# Patient Record
Sex: Female | Born: 1955 | Race: White | Hispanic: No | Smoking: Former smoker
Health system: Southern US, Community
[De-identification: ages and names within clinical notes are randomized; demographics above are authoritative.]

## PROBLEM LIST (undated history)

## (undated) DIAGNOSIS — F1910 Other psychoactive substance abuse, uncomplicated: Secondary | ICD-10-CM

## (undated) DIAGNOSIS — C801 Malignant (primary) neoplasm, unspecified: Secondary | ICD-10-CM

## (undated) DIAGNOSIS — M726 Necrotizing fasciitis: Secondary | ICD-10-CM

## (undated) DIAGNOSIS — G4700 Insomnia, unspecified: Secondary | ICD-10-CM

## (undated) DIAGNOSIS — J449 Chronic obstructive pulmonary disease, unspecified: Secondary | ICD-10-CM

## (undated) DIAGNOSIS — R0602 Shortness of breath: Secondary | ICD-10-CM

## (undated) DIAGNOSIS — Z720 Tobacco use: Secondary | ICD-10-CM

## (undated) DIAGNOSIS — D126 Benign neoplasm of colon, unspecified: Secondary | ICD-10-CM

## (undated) DIAGNOSIS — K529 Noninfective gastroenteritis and colitis, unspecified: Secondary | ICD-10-CM

## (undated) DIAGNOSIS — K648 Other hemorrhoids: Secondary | ICD-10-CM

## (undated) DIAGNOSIS — R45851 Suicidal ideations: Secondary | ICD-10-CM

## (undated) DIAGNOSIS — I96 Gangrene, not elsewhere classified: Secondary | ICD-10-CM

## (undated) DIAGNOSIS — T8859XA Other complications of anesthesia, initial encounter: Secondary | ICD-10-CM

## (undated) DIAGNOSIS — I10 Essential (primary) hypertension: Secondary | ICD-10-CM

## (undated) DIAGNOSIS — E119 Type 2 diabetes mellitus without complications: Secondary | ICD-10-CM

## (undated) DIAGNOSIS — K922 Gastrointestinal hemorrhage, unspecified: Secondary | ICD-10-CM

## (undated) DIAGNOSIS — F319 Bipolar disorder, unspecified: Secondary | ICD-10-CM

## (undated) DIAGNOSIS — F329 Major depressive disorder, single episode, unspecified: Secondary | ICD-10-CM

## (undated) DIAGNOSIS — F32A Depression, unspecified: Secondary | ICD-10-CM

## (undated) DIAGNOSIS — L03115 Cellulitis of right lower limb: Secondary | ICD-10-CM

## (undated) DIAGNOSIS — K644 Residual hemorrhoidal skin tags: Secondary | ICD-10-CM

## (undated) DIAGNOSIS — E785 Hyperlipidemia, unspecified: Secondary | ICD-10-CM

## (undated) DIAGNOSIS — T4145XA Adverse effect of unspecified anesthetic, initial encounter: Secondary | ICD-10-CM

## (undated) DIAGNOSIS — R51 Headache: Secondary | ICD-10-CM

## (undated) DIAGNOSIS — F419 Anxiety disorder, unspecified: Secondary | ICD-10-CM

## (undated) DIAGNOSIS — E039 Hypothyroidism, unspecified: Secondary | ICD-10-CM

## (undated) HISTORY — DX: Residual hemorrhoidal skin tags: K64.4

## (undated) HISTORY — PX: KNEE SURGERY: SHX244

## (undated) HISTORY — DX: Benign neoplasm of colon, unspecified: D12.6

## (undated) HISTORY — DX: Other hemorrhoids: K64.8

## (undated) HISTORY — PX: TONSILLECTOMY: SUR1361

## (undated) HISTORY — PX: FINGER SURGERY: SHX640

## (undated) HISTORY — PX: COLON SURGERY: SHX602

## (undated) HISTORY — PX: APPENDECTOMY: SHX54

## (undated) HISTORY — PX: TUBAL LIGATION: SHX77

## (undated) HISTORY — PX: ABDOMINAL HYSTERECTOMY: SHX81

## (undated) HISTORY — PX: HERNIA REPAIR: SHX51

---

## 2012-01-23 ENCOUNTER — Encounter (HOSPITAL_COMMUNITY): Payer: Self-pay | Admitting: Family Medicine

## 2012-01-23 ENCOUNTER — Emergency Department (HOSPITAL_COMMUNITY): Payer: PRIVATE HEALTH INSURANCE

## 2012-01-23 ENCOUNTER — Emergency Department (HOSPITAL_COMMUNITY)
Admission: EM | Admit: 2012-01-23 | Discharge: 2012-01-23 | Disposition: A | Discharge status: Home / Self Care | Payer: PRIVATE HEALTH INSURANCE | Attending: Emergency Medicine | Admitting: Emergency Medicine

## 2012-01-23 DIAGNOSIS — F411 Generalized anxiety disorder: Secondary | ICD-10-CM | POA: Insufficient documentation

## 2012-01-23 DIAGNOSIS — Z794 Long term (current) use of insulin: Secondary | ICD-10-CM | POA: Insufficient documentation

## 2012-01-23 DIAGNOSIS — F17200 Nicotine dependence, unspecified, uncomplicated: Secondary | ICD-10-CM | POA: Insufficient documentation

## 2012-01-23 DIAGNOSIS — E119 Type 2 diabetes mellitus without complications: Secondary | ICD-10-CM | POA: Insufficient documentation

## 2012-01-23 DIAGNOSIS — I10 Essential (primary) hypertension: Secondary | ICD-10-CM | POA: Insufficient documentation

## 2012-01-23 DIAGNOSIS — F319 Bipolar disorder, unspecified: Secondary | ICD-10-CM | POA: Insufficient documentation

## 2012-01-23 DIAGNOSIS — R0602 Shortness of breath: Secondary | ICD-10-CM | POA: Insufficient documentation

## 2012-01-23 DIAGNOSIS — F419 Anxiety disorder, unspecified: Secondary | ICD-10-CM

## 2012-01-23 DIAGNOSIS — Z79899 Other long term (current) drug therapy: Secondary | ICD-10-CM | POA: Insufficient documentation

## 2012-01-23 HISTORY — DX: Suicidal ideations: R45.851

## 2012-01-23 HISTORY — DX: Bipolar disorder, unspecified: F31.9

## 2012-01-23 HISTORY — DX: Depression, unspecified: F32.A

## 2012-01-23 HISTORY — DX: Insomnia, unspecified: G47.00

## 2012-01-23 HISTORY — DX: Essential (primary) hypertension: I10

## 2012-01-23 HISTORY — DX: Major depressive disorder, single episode, unspecified: F32.9

## 2012-01-23 LAB — URINE MICROSCOPIC-ADD ON

## 2012-01-23 LAB — CBC WITH DIFFERENTIAL/PLATELET
Basophils Absolute: 0.1 10*3/uL (ref 0.0–0.1)
Eosinophils Relative: 3 % (ref 0–5)
Lymphocytes Relative: 45 % (ref 12–46)
Lymphs Abs: 2.7 10*3/uL (ref 0.7–4.0)
MCV: 89.5 fL (ref 78.0–100.0)
Neutrophils Relative %: 45 % (ref 43–77)
Platelets: 166 10*3/uL (ref 150–400)
RBC: 4.75 MIL/uL (ref 3.87–5.11)
RDW: 13.6 % (ref 11.5–15.5)
WBC: 5.9 10*3/uL (ref 4.0–10.5)

## 2012-01-23 LAB — COMPREHENSIVE METABOLIC PANEL
ALT: 16 U/L (ref 0–35)
AST: 16 U/L (ref 0–37)
Alkaline Phosphatase: 89 U/L (ref 39–117)
CO2: 32 mEq/L (ref 19–32)
Calcium: 10.1 mg/dL (ref 8.4–10.5)
GFR calc non Af Amer: >90 mL/min (ref >90)
Glucose, Bld: 128 mg/dL — ABNORMAL HIGH (ref 70–99)
Potassium: 4 mEq/L (ref 3.5–5.1)
Sodium: 142 mEq/L (ref 135–145)
Total Protein: 7.8 g/dL (ref 6.0–8.3)

## 2012-01-23 LAB — GLUCOSE, CAPILLARY
Glucose-Capillary: 134 mg/dL — ABNORMAL HIGH (ref 70–99)
Glucose-Capillary: 105 mg/dL — ABNORMAL HIGH (ref 70–99)

## 2012-01-23 LAB — POCT I-STAT TROPONIN I

## 2012-01-23 LAB — URINALYSIS, ROUTINE W REFLEX MICROSCOPIC
Bilirubin Urine: NEGATIVE
Hgb urine dipstick: NEGATIVE
Ketones, ur: 15 mg/dL — AB
Specific Gravity, Urine: 1.026 (ref 1.005–1.030)
pH: 7.5 (ref 5.0–8.0)

## 2012-01-23 MED ORDER — FREESTYLE SYSTEM KIT: 1.0000 | PACK | Status: DC | PRN

## 2012-01-23 NOTE — ED Notes (Signed)
Pt requesting crackers for blood sugar.  CBG OF 105.

## 2012-01-23 NOTE — ED Notes (Signed)
The patient is AOx4 and comfortable with her discharge instructions. 

## 2012-01-23 NOTE — ED Notes (Signed)
Pt sts she is just not feeling well. sts dry mouth. Denies SOB but when talking patient speaking in short sentence and sats 90% on RA. Denies pain. Pt diabetic. CBG normal.

## 2012-01-23 NOTE — ED Provider Notes (Signed)
History     CSN: [id number]  Arrival date & time 01/23/12  1478   First MD Initiated Contact with Patient 01/23/12 2157      Chief Complaint  Patient presents with  . Fatigue  . Shortness of Breath    (Consider location/radiation/quality/duration/timing/severity/associated sxs/prior treatment) The history is provided by the patient and a relative.   the patient reports that she attempted to take her blood sugar today but her glucometer was not working.  She took insulin around lunchtime and blood sugar on arrival to the emergency department was in the low 100s.  They report they spoke with the pharmacist regarding getting new test strips and the pharmacist stated be $50 for this they had a prescription and then charged covered.  The patient recently moved to [place] from [place] several days ago and does not have a primary care physician. She reports she has all the rest of her medications she is concerned about her glucose and her ability to monitor it.  As reported from nursing notes that she stated "she just wasn't feeling well and that she had a dry mouth the nursing noted her to be in short sentences with O2 sats of 90% on room air.  The patient reports to me that she has no chest pain or shortness of breath.  She reports she is slightly anxious but has been for several days since her recent move from [place].  They reported they did not have the issues with her glucometer they would not come to the emergency department today for evaluation.  Patient is otherwise without symptoms.  Past Medical History  Diagnosis Date  . Hypertension   . Diabetes mellitus   . Bipolar 1 disorder   . Insomnia   . Depression   . Suicidal thoughts     History reviewed. No pertinent past surgical history.  History reviewed. No pertinent family history.  History  Substance Use Topics  . Smoking status: Current Everyday Smoker  . Smokeless tobacco: Not on file  . Alcohol Use: No     OB History    Grav Para Term Preterm Abortions TAB SAB Ect Mult Living                  Review of Systems  All other systems reviewed and are negative.    Allergies  Morphine and related  Home Medications   Current Outpatient Rx  Name Route Sig Dispense Refill  . AMLODIPINE BESYLATE 5 MG PO TABS Oral Take 5 mg by mouth daily.    . BUDESONIDE-FORMOTEROL FUMARATE 160-4.5 MCG/ACT IN AERO Inhalation Inhale 2 puffs into the lungs 2 (two) times daily.    Marland Kitchen DIVALPROEX SODIUM 500 MG PO TBEC Oral Take 500 mg by mouth 2 (two) times daily.    Marland Kitchen DOXEPIN HCL 75 MG PO CAPS Oral Take 75 mg by mouth at bedtime.    Marland Kitchen FLUTICASONE PROPIONATE  HFA 110 MCG/ACT IN AERO Inhalation Inhale 1 puff into the lungs 2 (two) times daily.    . INSULIN ASPART 100 UNIT/ML [place] SOLN Subcutaneous Inject 0-10 Units into the skin 3 (three) times daily before meals. Per sliding scale    . LEVETIRACETAM 250 MG PO TABS Oral Take 250 mg by mouth every 12 (twelve) hours.    Marland Kitchen METFORMIN HCL 500 MG PO TABS Oral Take 500 mg by mouth 2 (two) times daily with a meal.    . PRIMIDONE 50 MG PO TABS Oral Take 50 mg by  mouth at bedtime.    Marland Kitchen SIMVASTATIN 20 MG PO TABS Oral Take 20 mg by mouth every evening.      BP 141/63  Pulse 83  Temp 98.5 F (36.9 C) (Oral)  Resp 16  Ht 5\' 5"  (1.651 m)  Wt 265 lb (120.203 kg)  BMI 44.10 kg/m2  SpO2 95%  Physical Exam  Nursing note and vitals reviewed. Constitutional: She is oriented to person, place, and time. She appears well-developed and well-nourished. No distress.  HENT:  Head: Normocephalic and atraumatic.  Eyes: EOM are normal.  Neck: Normal range of motion.  Cardiovascular: Normal rate, regular rhythm and normal heart sounds.   Pulmonary/Chest: Effort normal and breath sounds normal.  Abdominal: Soft. She exhibits no distension. There is no tenderness.  Musculoskeletal: Normal range of motion.  Neurological: She is alert and oriented to person, place, and time.  Skin:  Skin is warm and dry.  Psychiatric: She has a normal mood and affect.       Anxious appearing    ED Course  Procedures (including critical care time)  Labs Reviewed  GLUCOSE, CAPILLARY - Abnormal; Notable for the following:    Glucose-Capillary 134 (*)     All other components within normal limits  COMPREHENSIVE METABOLIC PANEL - Abnormal; Notable for the following:    Glucose, Bld 128 (*)     Creatinine, Ser 0.49 (*)     Total Bilirubin 0.2 (*)     All other components within normal limits  URINALYSIS, ROUTINE W REFLEX MICROSCOPIC - Abnormal; Notable for the following:    APPearance CLOUDY (*)     Ketones, ur 15 (*)     Leukocytes, UA MODERATE (*)     All other components within normal limits  URINE MICROSCOPIC-ADD ON - Abnormal; Notable for the following:    Squamous Epithelial / LPF MANY (*)     All other components within normal limits  GLUCOSE, CAPILLARY - Abnormal; Notable for the following:    Glucose-Capillary 105 (*)     All other components within normal limits  CBC WITH DIFFERENTIAL  POCT I-STAT TROPONIN I   Dg Chest 2 View  01/23/2012  *RADIOLOGY REPORT*  Clinical Data: Shortness of breath with fatigue.  History of hypertension, COPD and diabetes.  CHEST - 2 VIEW  Comparison: None.  Findings: There is mild cardiac enlargement.  The mediastinal contours are normal.  The lungs are clear.  There is no pleural effusion or pneumothorax.  There is a probable old right-sided rib fracture posteriorly.  IMPRESSION: No acute cardiopulmonary process.  Mild cardiomegaly.   Original Report Authenticated By: Gerrianne Scale, M.D.     I personally reviewed the imaging tests through PACS system  I reviewed available ER/hospitalization records thought the EMR   1. Anxiety       MDM  The patient stated she was really in emergency department simply to get a glucometer strips for her glucose monitor.  On the [employer] this issue with the strips are initially with a glucometer  therefore bring her prescription for both.  Discharge home.  She's new to the area will need a primary care physician.        Lyanne Co, MD 01/23/12 [phone]

## 2012-04-13 ENCOUNTER — Emergency Department (HOSPITAL_COMMUNITY): Payer: Medicaid Other

## 2012-04-13 ENCOUNTER — Inpatient Hospital Stay (HOSPITAL_COMMUNITY)
Admission: EM | Admit: 2012-04-13 | Discharge: 2012-04-16 | DRG: 190 | Disposition: A | Discharge status: Home / Self Care | Payer: Medicaid Other | Attending: Internal Medicine | Admitting: Internal Medicine

## 2012-04-13 ENCOUNTER — Encounter (HOSPITAL_COMMUNITY): Payer: Self-pay | Admitting: Emergency Medicine

## 2012-04-13 ENCOUNTER — Observation Stay (HOSPITAL_COMMUNITY): Payer: Medicaid Other

## 2012-04-13 DIAGNOSIS — R45851 Suicidal ideations: Secondary | ICD-10-CM

## 2012-04-13 DIAGNOSIS — M545 Low back pain, unspecified: Secondary | ICD-10-CM | POA: Diagnosis present

## 2012-04-13 DIAGNOSIS — Z794 Long term (current) use of insulin: Secondary | ICD-10-CM

## 2012-04-13 DIAGNOSIS — IMO0002 Reserved for concepts with insufficient information to code with codable children: Secondary | ICD-10-CM | POA: Diagnosis present

## 2012-04-13 DIAGNOSIS — E119 Type 2 diabetes mellitus without complications: Secondary | ICD-10-CM

## 2012-04-13 DIAGNOSIS — E039 Hypothyroidism, unspecified: Secondary | ICD-10-CM | POA: Diagnosis present

## 2012-04-13 DIAGNOSIS — F32A Depression, unspecified: Secondary | ICD-10-CM | POA: Diagnosis present

## 2012-04-13 DIAGNOSIS — J9600 Acute respiratory failure, unspecified whether with hypoxia or hypercapnia: Secondary | ICD-10-CM | POA: Diagnosis present

## 2012-04-13 DIAGNOSIS — Z66 Do not resuscitate: Secondary | ICD-10-CM | POA: Diagnosis present

## 2012-04-13 DIAGNOSIS — G8929 Other chronic pain: Secondary | ICD-10-CM | POA: Diagnosis present

## 2012-04-13 DIAGNOSIS — J441 Chronic obstructive pulmonary disease with (acute) exacerbation: Principal | ICD-10-CM

## 2012-04-13 DIAGNOSIS — R0902 Hypoxemia: Secondary | ICD-10-CM

## 2012-04-13 DIAGNOSIS — I10 Essential (primary) hypertension: Secondary | ICD-10-CM | POA: Diagnosis present

## 2012-04-13 DIAGNOSIS — Z720 Tobacco use: Secondary | ICD-10-CM | POA: Diagnosis present

## 2012-04-13 DIAGNOSIS — G4709 Other insomnia: Secondary | ICD-10-CM | POA: Diagnosis present

## 2012-04-13 DIAGNOSIS — F319 Bipolar disorder, unspecified: Secondary | ICD-10-CM | POA: Diagnosis present

## 2012-04-13 DIAGNOSIS — E1065 Type 1 diabetes mellitus with hyperglycemia: Secondary | ICD-10-CM | POA: Diagnosis present

## 2012-04-13 DIAGNOSIS — D696 Thrombocytopenia, unspecified: Secondary | ICD-10-CM | POA: Diagnosis present

## 2012-04-13 DIAGNOSIS — G4700 Insomnia, unspecified: Secondary | ICD-10-CM

## 2012-04-13 DIAGNOSIS — J9620 Acute and chronic respiratory failure, unspecified whether with hypoxia or hypercapnia: Secondary | ICD-10-CM | POA: Diagnosis present

## 2012-04-13 DIAGNOSIS — M549 Dorsalgia, unspecified: Secondary | ICD-10-CM

## 2012-04-13 DIAGNOSIS — F17200 Nicotine dependence, unspecified, uncomplicated: Secondary | ICD-10-CM | POA: Diagnosis present

## 2012-04-13 DIAGNOSIS — F329 Major depressive disorder, single episode, unspecified: Secondary | ICD-10-CM

## 2012-04-13 DIAGNOSIS — E118 Type 2 diabetes mellitus with unspecified complications: Secondary | ICD-10-CM

## 2012-04-13 DIAGNOSIS — J449 Chronic obstructive pulmonary disease, unspecified: Secondary | ICD-10-CM

## 2012-04-13 DIAGNOSIS — Z79899 Other long term (current) drug therapy: Secondary | ICD-10-CM

## 2012-04-13 HISTORY — DX: Acute respiratory failure, unspecified whether with hypoxia or hypercapnia: J96.00

## 2012-04-13 HISTORY — DX: Chronic obstructive pulmonary disease, unspecified: J44.9

## 2012-04-13 HISTORY — DX: Other complications of anesthesia, initial encounter: T88.59XA

## 2012-04-13 HISTORY — DX: Type 2 diabetes mellitus without complications: E11.9

## 2012-04-13 HISTORY — DX: Adverse effect of unspecified anesthetic, initial encounter: T41.45XA

## 2012-04-13 HISTORY — DX: Chronic obstructive pulmonary disease with (acute) exacerbation: J44.1

## 2012-04-13 LAB — BASIC METABOLIC PANEL
Calcium: 9.3 mg/dL (ref 8.4–10.5)
GFR calc Af Amer: >90 mL/min (ref >90)
GFR calc non Af Amer: >90 mL/min (ref >90)
Glucose, Bld: 124 mg/dL — ABNORMAL HIGH (ref 70–99)
Potassium: 4.3 mEq/L (ref 3.5–5.1)
Sodium: 140 mEq/L (ref 135–145)

## 2012-04-13 LAB — GLUCOSE, CAPILLARY
Glucose-Capillary: 175 mg/dL — ABNORMAL HIGH (ref 70–99)
Glucose-Capillary: 218 mg/dL — ABNORMAL HIGH (ref 70–99)

## 2012-04-13 LAB — CBC WITH DIFFERENTIAL/PLATELET
Basophils Absolute: 0 10*3/uL (ref 0.0–0.1)
Basophils Relative: 1 % (ref 0–1)
Eosinophils Absolute: 0.2 10*3/uL (ref 0.0–0.7)
Eosinophils Relative: 3 % (ref 0–5)
Lymphs Abs: 2.4 10*3/uL (ref 0.7–4.0)
MCH: 29.3 pg (ref 26.0–34.0)
Neutrophils Relative %: 54 % (ref 43–77)
Platelets: 146 10*3/uL — ABNORMAL LOW (ref 150–400)
RBC: 4.57 MIL/uL (ref 3.87–5.11)
RDW: 13.9 % (ref 11.5–15.5)
WBC: 6.6 10*3/uL (ref 4.0–10.5)

## 2012-04-13 LAB — PRO B NATRIURETIC PEPTIDE: Pro B Natriuretic peptide (BNP): 218.8 pg/mL — ABNORMAL HIGH (ref 0–125)

## 2012-04-13 LAB — TROPONIN I: Troponin I: <0.3 ng/mL (ref <0.30)

## 2012-04-13 MED ORDER — LEVOFLOXACIN 500 MG PO TABS
750.0000 mg | ORAL_TABLET | Freq: Every day | ORAL | Status: DC
  Administered 2012-04-13 – 2012-04-16 (×4): 750 mg via ORAL
  Filled 2012-04-13 (×4): qty 2

## 2012-04-13 MED ORDER — SERTRALINE HCL 50 MG PO TABS
200.0000 mg | ORAL_TABLET | Freq: Every day | ORAL | Status: DC
  Administered 2012-04-14 – 2012-04-16 (×3): 200 mg via ORAL
  Filled 2012-04-13 (×3): qty 4

## 2012-04-13 MED ORDER — ALPRAZOLAM 0.25 MG PO TABS
0.2500 mg | ORAL_TABLET | Freq: Once | ORAL | Status: AC
  Administered 2012-04-13: 0.25 mg via ORAL
  Filled 2012-04-13: qty 1

## 2012-04-13 MED ORDER — TIOTROPIUM BROMIDE MONOHYDRATE 18 MCG IN CAPS
18.0000 ug | ORAL_CAPSULE | Freq: Every day | RESPIRATORY_TRACT | Status: DC
  Administered 2012-04-13 – 2012-04-16 (×4): 18 ug via RESPIRATORY_TRACT
  Filled 2012-04-13: qty 5

## 2012-04-13 MED ORDER — AMLODIPINE BESYLATE 5 MG PO TABS
5.0000 mg | ORAL_TABLET | Freq: Every day | ORAL | Status: DC
  Administered 2012-04-14 – 2012-04-16 (×3): 5 mg via ORAL
  Filled 2012-04-13 (×3): qty 1

## 2012-04-13 MED ORDER — FENTANYL CITRATE 0.05 MG/ML IJ SOLN
50.0000 ug | Freq: Once | INTRAMUSCULAR | Status: AC
  Administered 2012-04-13: 50 ug via INTRAVENOUS
  Filled 2012-04-13: qty 2

## 2012-04-13 MED ORDER — SIMVASTATIN 20 MG PO TABS
20.0000 mg | ORAL_TABLET | Freq: Every day | ORAL | Status: DC
  Administered 2012-04-13 – 2012-04-15 (×3): 20 mg via ORAL
  Filled 2012-04-13 (×4): qty 1

## 2012-04-13 MED ORDER — FLUTICASONE PROPIONATE HFA 110 MCG/ACT IN AERO
1.0000 | INHALATION_SPRAY | Freq: Two times a day (BID) | RESPIRATORY_TRACT | Status: DC
  Administered 2012-04-13 – 2012-04-16 (×6): 1 via RESPIRATORY_TRACT
  Filled 2012-04-13: qty 12

## 2012-04-13 MED ORDER — POLYETHYLENE GLYCOL 3350 17 G PO PACK
17.0000 g | PACK | Freq: Every day | ORAL | Status: DC | PRN
  Filled 2012-04-13: qty 1

## 2012-04-13 MED ORDER — CLONIDINE HCL 0.1 MG PO TABS
0.2000 mg | ORAL_TABLET | Freq: Every day | ORAL | Status: DC
  Administered 2012-04-13 – 2012-04-15 (×3): 0.2 mg via ORAL
  Filled 2012-04-13 (×4): qty 2

## 2012-04-13 MED ORDER — GABAPENTIN 300 MG PO CAPS
300.0000 mg | ORAL_CAPSULE | Freq: Three times a day (TID) | ORAL | Status: DC
  Administered 2012-04-13 – 2012-04-16 (×8): 300 mg via ORAL
  Filled 2012-04-13 (×11): qty 1

## 2012-04-13 MED ORDER — ENOXAPARIN SODIUM 60 MG/0.6ML ~~LOC~~ SOLN
60.0000 mg | SUBCUTANEOUS | Status: DC
  Administered 2012-04-13 – 2012-04-15 (×3): 60 mg via SUBCUTANEOUS
  Filled 2012-04-13 (×4): qty 0.6

## 2012-04-13 MED ORDER — ALBUTEROL SULFATE (5 MG/ML) 0.5% IN NEBU
5.0000 mg | INHALATION_SOLUTION | Freq: Once | RESPIRATORY_TRACT | Status: DC
  Filled 2012-04-13: qty 1

## 2012-04-13 MED ORDER — BUSPIRONE HCL 10 MG PO TABS
15.0000 mg | ORAL_TABLET | Freq: Two times a day (BID) | ORAL | Status: DC
  Administered 2012-04-13 – 2012-04-16 (×6): 15 mg via ORAL
  Filled 2012-04-13 (×7): qty 1.5

## 2012-04-13 MED ORDER — INSULIN ASPART 100 UNIT/ML ~~LOC~~ SOLN
0.0000 [IU] | Freq: Every day | SUBCUTANEOUS | Status: DC
  Administered 2012-04-13 – 2012-04-15 (×3): 2 [IU] via SUBCUTANEOUS

## 2012-04-13 MED ORDER — PROPRANOLOL HCL 40 MG PO TABS
40.0000 mg | ORAL_TABLET | Freq: Two times a day (BID) | ORAL | Status: DC
  Administered 2012-04-13 – 2012-04-16 (×6): 40 mg via ORAL
  Filled 2012-04-13 (×7): qty 1

## 2012-04-13 MED ORDER — INSULIN ASPART 100 UNIT/ML ~~LOC~~ SOLN
0.0000 [IU] | Freq: Three times a day (TID) | SUBCUTANEOUS | Status: DC
  Administered 2012-04-14: 3 [IU] via SUBCUTANEOUS
  Administered 2012-04-14 – 2012-04-15 (×3): 2 [IU] via SUBCUTANEOUS
  Administered 2012-04-15: 5 [IU] via SUBCUTANEOUS
  Administered 2012-04-15: 3 [IU] via SUBCUTANEOUS
  Administered 2012-04-16 (×2): 2 [IU] via SUBCUTANEOUS

## 2012-04-13 MED ORDER — ALBUTEROL SULFATE (5 MG/ML) 0.5% IN NEBU: 2.5000 mg | INHALATION_SOLUTION | RESPIRATORY_TRACT | Status: DC | PRN

## 2012-04-13 MED ORDER — IPRATROPIUM BROMIDE 0.02 % IN SOLN: 0.5000 mg | Freq: Four times a day (QID) | RESPIRATORY_TRACT | Status: DC

## 2012-04-13 MED ORDER — ACETAMINOPHEN 325 MG PO TABS: 650.0000 mg | ORAL_TABLET | Freq: Four times a day (QID) | ORAL | Status: DC | PRN

## 2012-04-13 MED ORDER — METHYLPREDNISOLONE SODIUM SUCC 125 MG IJ SOLR
60.0000 mg | Freq: Three times a day (TID) | INTRAMUSCULAR | Status: DC
  Administered 2012-04-13 – 2012-04-16 (×9): 60 mg via INTRAVENOUS
  Filled 2012-04-13 (×11): qty 0.96

## 2012-04-13 MED ORDER — ALBUTEROL SULFATE (5 MG/ML) 0.5% IN NEBU
2.5000 mg | INHALATION_SOLUTION | Freq: Four times a day (QID) | RESPIRATORY_TRACT | Status: DC
  Administered 2012-04-13: 2.5 mg via RESPIRATORY_TRACT
  Filled 2012-04-13: qty 0.5

## 2012-04-13 MED ORDER — DOXEPIN HCL 75 MG PO CAPS
150.0000 mg | ORAL_CAPSULE | Freq: Every day | ORAL | Status: DC
  Administered 2012-04-13 – 2012-04-15 (×3): 150 mg via ORAL
  Filled 2012-04-13 (×4): qty 2

## 2012-04-13 MED ORDER — PRIMIDONE 50 MG PO TABS
100.0000 mg | ORAL_TABLET | Freq: Every day | ORAL | Status: DC
  Administered 2012-04-13 – 2012-04-15 (×3): 100 mg via ORAL
  Filled 2012-04-13 (×4): qty 2

## 2012-04-13 MED ORDER — LISINOPRIL 20 MG PO TABS
20.0000 mg | ORAL_TABLET | Freq: Every day | ORAL | Status: DC
  Administered 2012-04-14 – 2012-04-16 (×3): 20 mg via ORAL
  Filled 2012-04-13 (×3): qty 1

## 2012-04-13 MED ORDER — HYDROCODONE-ACETAMINOPHEN 5-325 MG PO TABS
1.0000 | ORAL_TABLET | ORAL | Status: DC | PRN
  Administered 2012-04-13 – 2012-04-16 (×7): 2 via ORAL
  Filled 2012-04-13 (×7): qty 2

## 2012-04-13 MED ORDER — ACETAMINOPHEN 650 MG RE SUPP: 650.0000 mg | Freq: Four times a day (QID) | RECTAL | Status: DC | PRN

## 2012-04-13 MED ORDER — ALBUTEROL SULFATE (5 MG/ML) 0.5% IN NEBU
5.0000 mg | INHALATION_SOLUTION | Freq: Once | RESPIRATORY_TRACT | Status: AC
  Administered 2012-04-13: 5 mg via RESPIRATORY_TRACT

## 2012-04-13 MED ORDER — ENOXAPARIN SODIUM 40 MG/0.4ML ~~LOC~~ SOLN: 40.0000 mg | SUBCUTANEOUS | Status: DC

## 2012-04-13 MED ORDER — BUDESONIDE-FORMOTEROL FUMARATE 160-4.5 MCG/ACT IN AERO
2.0000 | INHALATION_SPRAY | Freq: Two times a day (BID) | RESPIRATORY_TRACT | Status: DC
  Administered 2012-04-13 – 2012-04-16 (×6): 2 via RESPIRATORY_TRACT
  Filled 2012-04-13: qty 6

## 2012-04-13 MED ORDER — NICOTINE 21 MG/24HR TD PT24
21.0000 mg | MEDICATED_PATCH | Freq: Every day | TRANSDERMAL | Status: DC
  Administered 2012-04-13 – 2012-04-16 (×4): 21 mg via TRANSDERMAL
  Filled 2012-04-13 (×4): qty 1

## 2012-04-13 MED ORDER — DIVALPROEX SODIUM ER 500 MG PO TB24
1000.0000 mg | ORAL_TABLET | Freq: Two times a day (BID) | ORAL | Status: DC
  Administered 2012-04-13 – 2012-04-16 (×6): 1000 mg via ORAL
  Filled 2012-04-13 (×7): qty 2

## 2012-04-13 MED ORDER — MIRTAZAPINE 7.5 MG PO TABS
7.5000 mg | ORAL_TABLET | Freq: Every day | ORAL | Status: DC
  Administered 2012-04-13 – 2012-04-15 (×3): 7.5 mg via ORAL
  Filled 2012-04-13 (×4): qty 1

## 2012-04-13 MED ORDER — METHYLPREDNISOLONE SODIUM SUCC 125 MG IJ SOLR
125.0000 mg | Freq: Once | INTRAMUSCULAR | Status: AC
  Administered 2012-04-13: 125 mg via INTRAVENOUS
  Filled 2012-04-13: qty 2

## 2012-04-13 MED ORDER — ONDANSETRON HCL 4 MG PO TABS: 4.0000 mg | ORAL_TABLET | Freq: Four times a day (QID) | ORAL | Status: DC | PRN

## 2012-04-13 MED ORDER — LEVOTHYROXINE SODIUM 88 MCG PO TABS
88.0000 ug | ORAL_TABLET | Freq: Every day | ORAL | Status: DC
  Administered 2012-04-14 – 2012-04-16 (×3): 88 ug via ORAL
  Filled 2012-04-13 (×4): qty 1

## 2012-04-13 MED ORDER — NICOTINE 21 MG/24HR TD PT24
21.0000 mg | MEDICATED_PATCH | Freq: Once | TRANSDERMAL | Status: DC
  Administered 2012-04-13: 21 mg via TRANSDERMAL
  Filled 2012-04-13: qty 1

## 2012-04-13 MED ORDER — IPRATROPIUM BROMIDE 0.02 % IN SOLN
0.5000 mg | Freq: Once | RESPIRATORY_TRACT | Status: AC
  Administered 2012-04-13: 0.5 mg via RESPIRATORY_TRACT
  Filled 2012-04-13: qty 2.5

## 2012-04-13 MED ORDER — ONDANSETRON HCL 4 MG/2ML IJ SOLN: 4.0000 mg | Freq: Four times a day (QID) | INTRAMUSCULAR | Status: DC | PRN

## 2012-04-13 MED ORDER — ZOLPIDEM TARTRATE 5 MG PO TABS
15.0000 mg | ORAL_TABLET | Freq: Every day | ORAL | Status: DC
  Administered 2012-04-13 – 2012-04-15 (×3): 15 mg via ORAL
  Filled 2012-04-13 (×3): qty 3

## 2012-04-13 NOTE — Progress Notes (Signed)
Pt confirms pcp Dr Jeannetta Nap in [place] [facility] updated

## 2012-04-13 NOTE — ED Provider Notes (Signed)
History     CSN: [id number]  Arrival date & time 04/13/12  1054   First MD Initiated Contact with Patient 04/13/12 1127      Chief Complaint  Patient presents with  . Back Pain    (Consider location/radiation/quality/duration/timing/severity/associated sxs/prior treatment) HPI Pt with history of chronic low back pain and COPD present to the ED for evaluation of severe aching lower back pain worse with movement and sitting, similar to previous. She was found to be hypoxic in triage. She also reports increased cough and wheezing recently, not improved with home medications. Does not wear O2 at home. Denies any CP. No fever.   Past Medical History  Diagnosis Date  . Hypertension   . Diabetes mellitus   . Bipolar 1 disorder   . Insomnia   . Depression   . Suicidal thoughts     History reviewed. No pertinent past surgical history.  History reviewed. No pertinent family history.  History  Substance Use Topics  . Smoking status: Current Every Day Smoker  . Smokeless tobacco: Not on file  . Alcohol Use: No    OB History    Grav Para Term Preterm Abortions TAB SAB Ect Mult Living                  Review of Systems All other systems reviewed and are negative except as noted in HPI.   Allergies  Morphine and related  Home Medications   Current Outpatient Rx  Name  Route  Sig  Dispense  Refill  . AMLODIPINE BESYLATE 10 MG PO TABS   Oral   Take 5 mg by mouth daily.         . BUDESONIDE-FORMOTEROL FUMARATE 160-4.5 MCG/ACT IN AERO   Inhalation   Inhale 2 puffs into the lungs 2 (two) times daily.         . BUSPIRONE HCL 15 MG PO TABS   Oral   Take 15 mg by mouth 2 (two) times daily.         Marland Kitchen CLONIDINE HCL 0.1 MG PO TABS   Oral   Take 0.2 mg by mouth at bedtime.         Marland Kitchen DIVALPROEX SODIUM ER 500 MG PO TB24   Oral   Take 1,000 mg by mouth 2 (two) times daily.         Marland Kitchen DOXEPIN HCL 50 MG PO CAPS   Oral   Take 150 mg by mouth at bedtime.         Marland Kitchen FLUTICASONE PROPIONATE  HFA 110 MCG/ACT IN AERO   Inhalation   Inhale 1 puff into the lungs 2 (two) times daily.         Marland Kitchen GABAPENTIN 300 MG PO CAPS   Oral   Take 300 mg by mouth 3 (three) times daily.         . INSULIN ASPART 100 UNIT/ML [place] SOLN   Subcutaneous   Inject 10-15 Units into the skin daily as needed. Per sliding scale         . LEVOTHYROXINE SODIUM 88 MCG PO TABS   Oral   Take 88 mcg by mouth daily.         Marland Kitchen LISINOPRIL 40 MG PO TABS   Oral   Take 20 mg by mouth daily.         Marland Kitchen METFORMIN HCL 850 MG PO TABS   Oral   Take 850 mg by mouth 2 (two) times daily with a  meal.         . MIRTAZAPINE 7.5 MG PO TABS   Oral   Take 7.5 mg by mouth at bedtime.         Marland Kitchen PRIMIDONE 50 MG PO TABS   Oral   Take 100 mg by mouth at bedtime.          Marland Kitchen PROPRANOLOL HCL 40 MG PO TABS   Oral   Take 40 mg by mouth 2 (two) times daily.         . SERTRALINE HCL 100 MG PO TABS   Oral   Take 200 mg by mouth daily.         Marland Kitchen SIMVASTATIN 40 MG PO TABS   Oral   Take 20 mg by mouth at bedtime.         Marland Kitchen ZOLPIDEM TARTRATE 10 MG PO TABS   Oral   Take 15 mg by mouth at bedtime.         Marland Kitchen FREESTYLE SYSTEM KIT   Does not apply   1 each by Does not apply route as needed for other.   1 each   1     BP 110/68  Pulse 73  Temp 98.6 F (37 C) (Oral)  Resp 22  SpO2 97%  Physical Exam  Nursing note and vitals reviewed. Constitutional: She is oriented to person, place, and time. She appears well-developed and well-nourished.  HENT:  Head: Normocephalic and atraumatic.  Eyes: EOM are normal. Pupils are equal, round, and reactive to light.  Neck: Normal range of motion. Neck supple.  Cardiovascular: Normal rate, normal heart sounds and intact distal pulses.   Pulmonary/Chest: She has wheezes.       Decreased air movement  Abdominal: Bowel sounds are normal. She exhibits no distension. There is no tenderness.  Musculoskeletal: Normal range of motion.  She exhibits tenderness (soft tissue tenderness in lower back). She exhibits no edema.  Neurological: She is alert and oriented to person, place, and time. She has normal strength. No cranial nerve deficit or sensory deficit.  Skin: Skin is warm and dry. No rash noted.  Psychiatric: She has a normal mood and affect.    ED Course  Procedures (including critical care time)  Labs Reviewed  CBC WITH DIFFERENTIAL - Abnormal; Notable for the following:    Platelets 146 (*)     All other components within normal limits  BASIC METABOLIC PANEL - Abnormal; Notable for the following:    CO2 35 (*)     Glucose, Bld 124 (*)     Creatinine, Ser 0.38 (*)     All other components within normal limits  PRO B NATRIURETIC PEPTIDE - Abnormal; Notable for the following:    Pro B Natriuretic peptide (BNP) 218.8 (*)     All other components within normal limits  TROPONIN I   Dg Chest 2 View  04/13/2012  *RADIOLOGY REPORT*  Clinical Data: Cough, shortness of breath, chest pain  CHEST - 2 VIEW  Comparison: 01/23/2012  Findings: Increased interstitial markings, likely chronic.  No focal consolidation. No pleural effusion or pneumothorax.  Moderate cardiomegaly.  Mild degenerative changes of the visualized thoracolumbar spine.  IMPRESSION: No evidence of acute cardiopulmonary disease.  Moderate cardiomegaly.   Original Report Authenticated By: Charline Bills, M.D.      No diagnosis found.    MDM   Date: 04/13/2012  Rate: 71  Rhythm: normal sinus rhythm  QRS Axis: normal  Intervals: normal  ST/T Wave  abnormalities: normal  Conduction Disutrbances: none  Narrative Interpretation: unremarkable  2:49 PM Pt has had neb x 2 with solumedrol but still wheezing and hypoxic, requiring 4L [place] to stay above 90%. Will admit for COPD exacerbation. Pt requesting nicotine patch and 'something for anxiety'.           Peterson Mathey B. Bernette Mayers, MD 04/13/12 1450

## 2012-04-13 NOTE — H&P (Signed)
Patient's PCP: Kaleen Mask, MD  Chief Complaint: Low back pain and shortness of breath  History of Present Illness: [name] [name] is a 56 y.o. Caucasian female with history of hypertension, type 2 diabetes, bipolar disorder, insomnia, depression, and COPD he presents with the above complaints.  Patient has moved to [facility] on August of 2013, prior to that was living in [place].  She reports that she has a history of COPD, has stopped smoking for about 7 months but after she moved to [place] she started smoking again.  She has history of chronic low back pain with history of lumbar disk problems which started 7 years ago.  Patient has seen her primary care physician and has been referred to [facility] orthopedics for her back pain and has an appointment in December.  She had worsening back pain which limited her movement at home as a result she presented to the ED for evaluation. In the ED patient was found to be hypoxic with wheezing.  The hospitalist service was asked to admit the patient for COPD exacerbation.  Patient has not had any fevers, chills, nausea, vomiting, chest, abdominal pain, diarrhea, headaches or vision changes.  Past Medical History  Diagnosis Date  . Hypertension   . Diabetes mellitus   . Bipolar 1 disorder   . Insomnia   . Depression   . Suicidal thoughts   . COPD (chronic obstructive pulmonary disease)    History reviewed. No pertinent past surgical history. Family History  Problem Relation Age of Onset  . Multiple sclerosis Father    History   Social History  . Marital Status: Divorced    Spouse Name: N/A    Number of Children: N/A  . Years of Education: N/A   Occupational History  . Not on file.   Social History Main Topics  . Smoking status: Current Every Day Smoker -- 1.0 packs/day for 42 years  . Smokeless tobacco: Never Used  . Alcohol Use: No     Comment: no etoh now, used in the past  . Drug Use: Yes    Special:  Marijuana     Comment: high school - speed, hx prescription drugs 04/13/12 - no drug use now  . Sexually Active:    Other Topics Concern  . 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   Allergies: Morphine and related  Home Meds: Prior to Admission medications   Medication Sig Start Date End Date Taking? Authorizing Provider  amLODipine (NORVASC) 10 MG tablet Take 5 mg by mouth daily.   Yes Historical Provider, MD  budesonide-formoterol (SYMBICORT) 160-4.5 MCG/ACT inhaler Inhale 2 puffs into the lungs 2 (two) times daily.   Yes Historical Provider, MD  busPIRone (BUSPAR) 15 MG tablet Take 15 mg by mouth 2 (two) times daily.   Yes Historical Provider, MD  cloNIDine (CATAPRES) 0.1 MG tablet Take 0.2 mg by mouth at bedtime.   Yes Historical Provider, MD  divalproex (DEPAKOTE ER) 500 MG 24 hr tablet Take 1,000 mg by mouth 2 (two) times daily.   Yes Historical Provider, MD  doxepin (SINEQUAN) 50 MG capsule Take 150 mg by mouth at bedtime.   Yes Historical Provider, MD  fluticasone (FLOVENT HFA) 110 MCG/ACT inhaler Inhale 1 puff into the lungs 2 (two) times daily.   Yes Historical Provider, MD  gabapentin (NEURONTIN) 300 MG capsule Take 300 mg by mouth 3 (three) times daily.  Yes Historical Provider, MD  insulin aspart (NOVOLOG) 100 UNIT/ML injection Inject 10-15 Units into the skin daily as needed. Per sliding scale   Yes Historical Provider, MD  levothyroxine (SYNTHROID, LEVOTHROID) 88 MCG tablet Take 88 mcg by mouth daily.   Yes Historical Provider, MD  lisinopril (PRINIVIL,ZESTRIL) 40 MG tablet Take 20 mg by mouth daily.   Yes Historical Provider, MD  metFORMIN (GLUCOPHAGE) 850 MG tablet Take 850 mg by mouth 2 (two) times daily with a meal.   Yes Historical Provider, MD  mirtazapine (REMERON) 7.5 MG tablet Take 7.5 mg by mouth at bedtime.   Yes Historical Provider, MD  primidone (MYSOLINE) 50 MG tablet Take 100 mg by  mouth at bedtime.    Yes Historical Provider, MD  propranolol (INDERAL) 40 MG tablet Take 40 mg by mouth 2 (two) times daily.   Yes Historical Provider, MD  sertraline (ZOLOFT) 100 MG tablet Take 200 mg by mouth daily.   Yes Historical Provider, MD  simvastatin (ZOCOR) 40 MG tablet Take 20 mg by mouth at bedtime.   Yes Historical Provider, MD  zolpidem (AMBIEN) 10 MG tablet Take 15 mg by mouth at bedtime.   Yes Historical Provider, MD  glucose monitoring kit (FREESTYLE) monitoring kit 1 each by Does not apply route as needed for other. 01/23/12 01/22/13  Lyanne Co, MD    Review of Systems: All systems reviewed with the patient and positive as per history of present illness, otherwise all other systems are negative.  Physical Exam: Blood pressure 109/62, pulse 73, temperature 98.7 F (37.1 C), temperature source Oral, resp. rate 16, SpO2 91.00%. General: Awake, Oriented x3, in some respiratory distress. HEENT: EOMI, Moist mucous membranes Neck: Supple CV: S1 and S2 Lungs: Moderate air movement with expiratory wheezing. Abdomen: Soft, Nontender, Nondistended, +bowel sounds. Ext: Good pulses. Trace edema. No clubbing or cyanosis noted. Neuro: Cranial Nerves II-XII grossly intact. Has 5/5 motor strength in upper and lower extremities. Back: Tenderness to palpation over the paraspinal muscles bilaterally.  Lab results:  Basename 04/13/12 1200  NA 140  K 4.3  CL 100  CO2 35*  GLUCOSE 124*  BUN 15  CREATININE 0.38*  CALCIUM 9.3  MG --  PHOS --   No results found for this basename: AST:2,ALT:2,ALKPHOS:2,BILITOT:2,PROT:2,ALBUMIN:2 in the last 72 hours No results found for this basename: LIPASE:2,AMYLASE:2 in the last 72 hours  Basename 04/13/12 1200  WBC 6.6  NEUTROABS 3.6  HGB 13.4  HCT 41.4  MCV 90.6  PLT 146*    Basename 04/13/12 1200  CKTOTAL --  CKMB --  CKMBINDEX --  TROPONINI <0.30   No components found with this basename: POCBNP:3 No results found for this  basename: DDIMER in the last 72 hours No results found for this basename: HGBA1C:2 in the last 72 hours No results found for this basename: CHOL:2,HDL:2,LDLCALC:2,TRIG:2,CHOLHDL:2,LDLDIRECT:2 in the last 72 hours No results found for this basename: TSH,T4TOTAL,FREET3,T3FREE,THYROIDAB in the last 72 hours No results found for this basename: VITAMINB12:2,FOLATE:2,FERRITIN:2,TIBC:2,IRON:2,RETICCTPCT:2 in the last 72 hours Imaging results:  Dg Chest 2 View  04/13/2012  *RADIOLOGY REPORT*  Clinical Data: Cough, shortness of breath, chest pain  CHEST - 2 VIEW  Comparison: 01/23/2012  Findings: Increased interstitial markings, likely chronic.  No focal consolidation. No pleural effusion or pneumothorax.  Moderate cardiomegaly.  Mild degenerative changes of the visualized thoracolumbar spine.  IMPRESSION: No evidence of acute cardiopulmonary disease.  Moderate cardiomegaly.   Original Report Authenticated By: Charline Bills, M.D.    Other results:  EKG: Normal sinus rhythm with heart rate of 71.  Assessment & Plan by Problem: Acute on chronic hypoxic respiratory failure due to COPD exacerbation Continue Solu-Medrol.  Continued treatment.  Initiated levofloxacin for a total of 5 days.  Depending on patient's clinical course transitioned to prednisone.  Continue oxygen by nasal cannula to maintain saturation greater than 88%, wean off oxygen as tolerated.  Pending on patient's clinical course may need oxygen at discharge.  Tobacco abuse Patient counseled on smoking cessation.  Nicotine patchs prescribed.  Low back pain X-ray of the lumbar spine shows lower lumbars spondylosis with trace L4-L5 anterolisthesis, no acute osseous abnormality.  Patient has an appointment with [facility] orthopedics in December.  Continue conservative management with physical therapy and pain control.  Uncertain if patient lay down flat for MRI if having persistent pain.  Hypertension Stable.  Continue home antihypertensive  medications.  Type 2 diabetes uncontrolled with complications Continue sliding scale insulin.  Hold metformin.  Hemoglobin A1c in the morning to determine glycemic control.  Given patient is on steroids, monitor for hypoglycemia.  Depression Stable.  Continue home medications.  Thrombocytopenia Continue to monitor.  Hypothyroidism Continue levothyroxine.  Prophylaxis Lovenox.  CODE STATUS DNR/DNI.  Disposition Admit the patient as observation to MedSurg.  Time spent on admission, talking to the patient, and coordinating care was: 60 mins.  Anastasiya Gowin A, MD 04/13/2012, 3:46 PM

## 2012-04-13 NOTE — ED Notes (Signed)
Pt c/o chronic back pain, pt states she moved her in August and has an appt with a new doctor in December but can't wait.  Pt noted to have O2 sat of 85% in triage.

## 2012-04-14 DIAGNOSIS — J449 Chronic obstructive pulmonary disease, unspecified: Secondary | ICD-10-CM

## 2012-04-14 DIAGNOSIS — E118 Type 2 diabetes mellitus with unspecified complications: Secondary | ICD-10-CM

## 2012-04-14 DIAGNOSIS — M545 Low back pain: Secondary | ICD-10-CM

## 2012-04-14 DIAGNOSIS — J9620 Acute and chronic respiratory failure, unspecified whether with hypoxia or hypercapnia: Secondary | ICD-10-CM

## 2012-04-14 LAB — BASIC METABOLIC PANEL
BUN: 18 mg/dL (ref 6–23)
Chloride: 98 mEq/L (ref 96–112)
GFR calc Af Amer: >90 mL/min (ref >90)
GFR calc non Af Amer: >90 mL/min (ref >90)
Potassium: 4.2 mEq/L (ref 3.5–5.1)
Sodium: 140 mEq/L (ref 135–145)

## 2012-04-14 LAB — HEMOGLOBIN A1C
Hgb A1c MFr Bld: 6.1 % — ABNORMAL HIGH (ref <5.7)
Mean Plasma Glucose: 128 mg/dL — ABNORMAL HIGH (ref <117)

## 2012-04-14 LAB — GLUCOSE, CAPILLARY
Glucose-Capillary: 215 mg/dL — ABNORMAL HIGH (ref 70–99)
Glucose-Capillary: 250 mg/dL — ABNORMAL HIGH (ref 70–99)

## 2012-04-14 LAB — CBC
HCT: 42.6 % (ref 36.0–46.0)
Hemoglobin: 13.5 g/dL (ref 12.0–15.0)
RBC: 4.71 MIL/uL (ref 3.87–5.11)
WBC: 5.2 10*3/uL (ref 4.0–10.5)

## 2012-04-14 MED ORDER — ALBUTEROL SULFATE (5 MG/ML) 0.5% IN NEBU
2.5000 mg | INHALATION_SOLUTION | Freq: Four times a day (QID) | RESPIRATORY_TRACT | Status: DC
  Administered 2012-04-14: 2.5 mg via RESPIRATORY_TRACT
  Filled 2012-04-14: qty 0.5

## 2012-04-14 MED ORDER — ALBUTEROL SULFATE (5 MG/ML) 0.5% IN NEBU: 2.5000 mg | INHALATION_SOLUTION | RESPIRATORY_TRACT | Status: DC | PRN

## 2012-04-14 MED ORDER — ALBUTEROL SULFATE (5 MG/ML) 0.5% IN NEBU
2.5000 mg | INHALATION_SOLUTION | RESPIRATORY_TRACT | Status: DC
  Administered 2012-04-14 – 2012-04-15 (×6): 2.5 mg via RESPIRATORY_TRACT
  Filled 2012-04-14 (×6): qty 0.5

## 2012-04-14 NOTE — Progress Notes (Addendum)
Patient ambulated in halls without oxygen, oxygen saturation decreased to low 80's. Patient's oxygen saturation is at 93-96% on 3 L at rest.  Oxygen saturation was 83% with ambulation.

## 2012-04-14 NOTE — Progress Notes (Signed)
Choices offered   HOME HEALTH AGENCIES SERVING [place]   Agencies that are Medicare-Certified and are affiliated with [facility]  Telephone Number Address  [facility]   [facility] has ownership interest in this company; however, you are under no obligation to use this agency. [phone] or  [phone] [address] [place], [place] [address] [email or web address]   Agencies that are Medicare-Certified and are not affiliated with [facility]                                                                                 Home Health Agency Telephone Number Address  [facility] [phone] Fax [phone] [address], [address] [place], [place]  [address] [email or web address]  [facility] [phone] or [phone] Fax [phone] [address] [place], [place] [address] [email or web address]  [facility] [phone] Fax [phone] [address] [place], [place] [address] [email or web address]  [facility] [phone] Fax [phone] [address], [address] [place], [place]  [address] [email or web address]  [facility] [phone] Fax [phone] [address], [address] [place], [place] [address] [email or web address]  [facility] [phone] [address] [place], [place] [address] [email or web address]  [facility] [phone]  [address] [place], [place] [address] [email or web address]  [facility] [phone] or [phone] Fax number [phone] [address], [address] [place], [place]  [phone] [email or web address]  [facility] [phone] Fax [phone] [address] [place], [place]   [address]  [facility]  [phone] Fax [phone] [address] [place],  [address] [email or web address]   

## 2012-04-14 NOTE — Progress Notes (Signed)
[facility] PROGRESS NOTE  Britlyn Martine ZOX:[id number] DOB: [birth year] DOA: 04/13/2012  PCP: Kaleen Mask, MD  Brief HPI: [name] [name] is a 56 y.o. Caucasian female with history of hypertension, type 2 diabetes, bipolar disorder, insomnia, depression, and COPD he presents with the above complaints. Patient has moved to [facility] on August of 2013, prior to that was living in [place] state. She reports that she has a history of COPD, has stopped smoking for about 7 months but after she moved to [place] she started smoking again. She has history of chronic low back pain with history of lumbar disk problems which started 7 years ago. Patient has seen her primary care physician and has been referred to [facility] orthopedics for her back pain and knee pain and has an appointment in December. She had worsening back pain which limited her movement at home as a result she presented to the ED for evaluation. In the ED patient was found to be hypoxic with wheezing. The hospitalist service was asked to admit the patient for COPD exacerbation. Patient has not had any fevers, chills, nausea, vomiting, chest, abdominal pain, diarrhea, headaches or vision changes.  Past medical history:  Past Medical History  Diagnosis Date  . Hypertension   . Diabetes mellitus   . Bipolar 1 disorder   . Insomnia   . Depression   . Suicidal thoughts   . COPD (chronic obstructive pulmonary disease)   . Complication of anesthesia     Hx resp distress during surgery    Consultants: None  Procedures: None  Antibiotics: Levaquin 11/22  Subjective: Patient feels slightly better w.r.t breathing. Still coughing. Occasional chest tightness. Wheezing. Still has low right back pain. Has been ongoing for 7 years on and off. Hasn't had a MRI ever. Currently pain in 2/10. Admits to fall and injury a few days ago which made the pain worse. Denies any bowel or bladder  problems.  Objective: Vital Signs  Filed Vitals:   04/14/12 0111 04/14/12 0634 04/14/12 0811 04/14/12 1014  BP: 111/67 137/61  111/67  Pulse: 77 73    Temp:  97.9 F (36.6 C)    TempSrc:  Oral    Resp:  16    Height:      Weight:      SpO2:  93% 90% 94%    Intake/Output Summary (Last 24 hours) at 04/14/12 1054 Last data filed at 04/13/12 2300  Gross per 24 hour  Intake    600 ml  Output      0 ml  Net    600 ml   Filed Weights   04/13/12 1647  Weight: 113 kg (249 lb 1.9 oz)    Intake/Output from previous day: 11/22 0701 - 11/23 0700 In: 600 [P.O.:600] Out: -   General appearance: alert, cooperative, appears stated age, no distress and moderately obese Head: Normocephalic, without obvious abnormality, atraumatic Back: tenderness to palpation in right lower back. Resp: end exp wheezing bilaterally. no crackles. Cardio: regular rate and rhythm, S1, S2 normal, no murmur, click, rub or gallop GI: soft, non-tender; bowel sounds normal; no masses,  no organomegaly Extremities: extremities normal, atraumatic, no cyanosis or edema Pulses: 2+ and symmetric Skin: Skin color, texture, turgor normal. No rashes or lesions Neurologic: Alert and oriented x 3. No cranial nerve deficits. LE strength lower in right leg compared to left. Gait not assessed.  Lab Results:  Basic Metabolic Panel:  Lab 04/14/12 8119 04/13/12 1200  NA 140 140  K  4.2 4.3  CL 98 100  CO2 35* 35*  GLUCOSE 169* 124*  BUN 18 15  CREATININE 0.36* 0.38*  CALCIUM 9.2 9.3  MG -- --  PHOS -- --   Liver Function Tests: No results found for this basename: AST:5,ALT:5,ALKPHOS:5,BILITOT:5,PROT:5,ALBUMIN:5 in the last 168 hours No results found for this basename: LIPASE:5,AMYLASE:5 in the last 168 hours No results found for this basename: AMMONIA:5 in the last 168 hours CBC:  Lab 04/14/12 0407 04/13/12 1200  WBC 5.2 6.6  NEUTROABS -- 3.6  HGB 13.5 13.4  HCT 42.6 41.4  MCV 90.4 90.6  PLT 143* 146*    Cardiac Enzymes:  Lab 04/13/12 1200  CKTOTAL --  CKMB --  CKMBINDEX --  TROPONINI <0.30   BNP (last 3 results)  Basename 04/13/12 1200  PROBNP 218.8*   CBG:  Lab 04/14/12 0804 04/13/12 2202 04/13/12 1711  GLUCAP 182* 218* 175*    No results found for this or any previous visit (from the past 240 hour(s)).    Studies/Results: Dg Chest 2 View  04/13/2012  *RADIOLOGY REPORT*  Clinical Data: Cough, shortness of breath, chest pain  CHEST - 2 VIEW  Comparison: 01/23/2012  Findings: Increased interstitial markings, likely chronic.  No focal consolidation. No pleural effusion or pneumothorax.  Moderate cardiomegaly.  Mild degenerative changes of the visualized thoracolumbar spine.  IMPRESSION: No evidence of acute cardiopulmonary disease.  Moderate cardiomegaly.   Original Report Authenticated By: Charline Bills, M.D.    Dg Lumbar Spine Complete  04/13/2012  *RADIOLOGY REPORT*  Clinical Data: Low back pain without trauma.  Injury a few years ago.  LUMBAR SPINE - COMPLETE 4+ VIEW  Comparison: None.  Findings: Five lumbar type vertebral bodies.  Sacroiliac joints are symmetric.  Maintenance of vertebral body height.  5 mm of anterolisthesis of L4 on L5.  Mild degenerative disease at the lumbosacral junction.  Facet arthropathy at L4-L5 and L5-S1.  IMPRESSION: Lower lumbar spondylosis with trace L4-L5 anterolisthesis. No acute osseous abnormality.   Original Report Authenticated By: Jeronimo Greaves, M.D.     Medications:  Scheduled:   . albuterol  2.5 mg Nebulization QID  . albuterol  5 mg Nebulization Once  . [COMPLETED] albuterol  5 mg Nebulization Once  . [COMPLETED] ALPRAZolam  0.25 mg Oral Once  . amLODipine  5 mg Oral Daily  . budesonide-formoterol  2 puff Inhalation BID  . busPIRone  15 mg Oral BID  . cloNIDine  0.2 mg Oral QHS  . divalproex  1,000 mg Oral BID  . doxepin  150 mg Oral QHS  . enoxaparin (LOVENOX) injection  60 mg Subcutaneous Q24H  . [COMPLETED] fentaNYL  50  mcg Intravenous Once  . fluticasone  1 puff Inhalation BID  . gabapentin  300 mg Oral TID  . insulin aspart  0-5 Units Subcutaneous QHS  . insulin aspart  0-9 Units Subcutaneous TID WC  . [COMPLETED] ipratropium  0.5 mg Nebulization Once  . levofloxacin  750 mg Oral Daily  . levothyroxine  88 mcg Oral QAC breakfast  . lisinopril  20 mg Oral Daily  . [COMPLETED] methylPREDNISolone (SOLU-MEDROL) injection  125 mg Intravenous Once  . methylPREDNISolone (SOLU-MEDROL) injection  60 mg Intravenous Q8H  . mirtazapine  7.5 mg Oral QHS  . nicotine  21 mg Transdermal Once  . nicotine  21 mg Transdermal Daily  . primidone  100 mg Oral QHS  . propranolol  40 mg Oral BID  . sertraline  200 mg Oral Daily  .  simvastatin  20 mg Oral QHS  . tiotropium  18 mcg Inhalation Daily  . zolpidem  15 mg Oral QHS  . [DISCONTINUED] albuterol  2.5 mg Nebulization Q6H  . [DISCONTINUED] enoxaparin (LOVENOX) injection  40 mg Subcutaneous Q24H  . [DISCONTINUED] ipratropium  0.5 mg Nebulization Q6H   Continuous:  ZOX:[id number], acetaminophen, albuterol, HYDROcodone-acetaminophen, ondansetron (ZOFRAN) IV, ondansetron, polyethylene glycol, [DISCONTINUED] albuterol  Assessment/Plan:  Principal Problem:  *COPD exacerbation Active Problems:  Hypertension  Bipolar 1 disorder  Insomnia  Depression  Acute-on-chronic respiratory failure  Low back pain  Thrombocytopenia  DM (diabetes mellitus), type 2, uncontrolled with complications  Tobacco abuse    Acute on chronic hypoxic respiratory failure due to COPD exacerbation  Patient feels slightly better. Continue steroids, antibiotics, nebulizer treatments, Oxygen. Check RA sats. Patient counseled to stop smoking.   Low back pain with weakness in right LE  X-ray of the lumbar spine shows lower lumbars spondylosis with trace L4-L5 anterolisthesis, no acute osseous abnormality. Patient has an appointment with [facility] orthopedics in December. Continue  conservative management with physical therapy and pain control. Would like to get MRI but would like for her respiratory status to be a baseline before pursuing.   Hypertension  Stable. Continue home antihypertensive medications.   Type 2 diabetes uncontrolled with complications  Continue sliding scale insulin. Hold metformin. Hemoglobin A1c is pending. Given patient is on steroids, monitor for hypoglycemia. She is also on some form of Insulin, she thinks it could be Lantus but is not certain.  Depression  Stable. Continue home medications.   Mild Thrombocytopenia  Unclear etiology. Continue to monitor.    Tobacco abuse  Patient counseled on smoking cessation. Nicotine patchs prescribed.   Hypothyroidism  Continue levothyroxine.   DVT Prophylaxis  Lovenox.   CODE STATUS  DNR/DNI.   Family Communication: Daughter at bedside.  Disposition Plan: Await improvement. Await PT/OT.   LOS: 1 day   [facility]  [facility] Pager [phone] 04/14/2012, 10:54 AM  If 8PM-8AM, please contact night-coverage at [email or web address], password [facility]

## 2012-04-14 NOTE — Care Management Note (Signed)
  Page 1 of 1   04/14/2012     3:23:33 PM   CARE MANAGEMENT NOTE 04/14/2012  Patient:  [name] [name], [name] [name]   Account Number:  [id number]  Date Initiated:  04/14/2012  Documentation initiated by:  Edd Arbour  Subjective/Objective Assessment:   71 female Medicaid of [place] pt Dtr is primary care giver Has nebulizer and inhalers at home pmh chronic back pain moved to [place] in 12/2011 Acute on chronic hypoxic respiratory failure due to COPD exacerbation No Oxygen at home     Action/Plan:   Cm spoke with pt and daughter Cm spoke with RN -see below comment   Anticipated DC Date:  04/15/2012   Anticipated DC Plan:  HOME/SELF CARE      DC Planning Services  CM consult      Choice offered to / List presented to:  C-1 Patient           Status of service:  In process, will continue to follow Medicare Important Message given?   (If response is "NO", the following Medicare IM given date fields will be blank) Date Medicare IM given:   Date Additional Medicare IM given:    Discharge Disposition:    Per UR Regulation:    If discussed at Long Length of Stay Meetings, dates discussed:    Comments:  04/14/12 1510 Clinton Gallant, RN, CCM [phone] CM noted Cm consult for COPD protocol. CM spoke with pt and Dtr at bedside. Pt confirm no Oxygen at the home.  Pt states she has not been told she needs oxygen for d/c and has not used a home health agency previously.  Dtr inquired about how she could get paid for assisting her mother in the home. CM referred dtr to PDN (private duty nursing) and home health agencies CM left with pt and daughter a list of [place] home health agencies and PDN services Cm spoke with [place] RN about noting in the H&P that MD stated pt may need Oxygen for d/c and requested RN check pt's Oxygen saturation off of oxygen and after walking to assist with requirements for home oxygen use from a home health agency pending a possible order from attending MD

## 2012-04-14 NOTE — Evaluation (Signed)
Occupational Therapy Evaluation Patient Details Name: [name] [name] MRN: [id number] DOB: February 22, 1956 Today's Date: 04/14/2012 Time: 2130-8657 OT Time Calculation (min): 48 min  OT Assessment / Plan / Recommendation Clinical Impression  Pt seen for OT eval admitted secondary to increased back pain and decreased mobility and COPD.  O2 sats 96% on 4Ls nasal cannula.  Decrease to 83% with mobility on room air and maintain 89 % at rest on room air.  Overall performs simulated selfcare tasks at an overall supervision to modified independent level with use of appropriate DME.  Feel she will need a tub bench at discharge to increase safety with shower transfers.  Do not feel she will need any post acute OT at discharge but will continue to follow on inpatient.    OT Assessment  Patient needs continued OT Services    Follow Up Recommendations  No OT follow up    Barriers to Discharge None    Equipment Recommendations  Tub/shower bench       Frequency  Min 2X/week    Precautions / Restrictions Precautions Precautions: Fall Restrictions Weight Bearing Restrictions: No   Pertinent Vitals/Pain O2 96% on 4ls at rest, decreased to 89% on room air at rest, decreased to 83% on room air with mobility   ADL  Eating/Feeding: Simulated;Independent Where Assessed - Eating/Feeding: Edge of bed Grooming: Simulated;Independent Where Assessed - Grooming: Unsupported standing Upper Body Bathing: Simulated;Set up Where Assessed - Upper Body Bathing: Unsupported sitting Lower Body Bathing: Simulated;Minimal assistance Where Assessed - Lower Body Bathing: Supported sit to stand Upper Body Dressing: Simulated;Set up Where Assessed - Upper Body Dressing: Unsupported sitting Lower Body Dressing: Performed;Moderate assistance Where Assessed - Lower Body Dressing: Supported sit to Pharmacist, hospital: Performed;Independent Toilet Transfer Method: Other (comment) (ambulate to regular toilet) Toilet  Transfer Equipment: Comfort height toilet Toileting - Clothing Manipulation and Hygiene: Simulated;Independent Where Assessed - Toileting Clothing Manipulation and Hygiene: Sit to stand from 3-in-1 or toilet Tub/Shower Transfer: Simulated;Min guard Tub/Shower Transfer Method: Other (comment) (simulated stepping over the edg of the tub) Transfers/Ambulation Related to ADLs: Pt overall independent with mobility without assistive device.  Increased difficulty attempting to step over simulated tub edge, being able to lift her LLE high enough to clear the edge. ADL Comments: Pt educated on bed mobility to help decrease back pain.  Also educated on AE use for LB selfcare secondary to pt not being able to reach her feet for donning her socks.  Reports increased pain when attempting to bend forward to reach her feet.  Provided AE hip kit.  Also feel she will benefit from a tub shower bench at discharge secondary to not being able to safely and efficiently step over the edge of the tub.    OT Diagnosis: Generalized weakness;Acute pain  OT Problem List: Decreased activity tolerance;Decreased knowledge of use of DME or AE;Pain OT Treatment Interventions: Self-care/ADL training;DME and/or AE instruction;Patient/family education;Balance training;Therapeutic activities;Energy conservation   OT Goals Acute Rehab OT Goals OT Goal Formulation: With patient Time For Goal Achievement: 04/21/12 Potential to Achieve Goals: Good ADL Goals Pt Will Perform Lower Body Dressing: with modified independence;Sit to stand from chair;with adaptive equipment ADL Goal: Lower Body Dressing - Progress: Goal set today Pt Will Perform Tub/Shower Transfer: with modified independence;Transfer tub bench ADL Goal: Tub/Shower Transfer - Progress: Goal set today Miscellaneous OT Goals Miscellaneous OT Goal #1: Pt will state at least 3 energy conservation/ back protection recommendations with independence to increase safety and  performance during ADLS. OT  Goal: Miscellaneous Goal #1 - Progress: Goal set today  Visit Information  Last OT Received On: 04/14/12 Assistance Needed: +1    Subjective Data  Subjective: My back was bothering me pretty bad. Patient Stated Goal: To be able to walk around her mobile home without pain.   Prior Functioning     Home Living Lives With: Daughter Available Help at Discharge: Other (Comment) (daughter in and out at home) Type of Home: Mobile home Home Access: Stairs to enter [employer] of Steps: 4 Entrance Stairs-Rails: Right Home Layout: One level Bathroom Shower/Tub: Engineer, manufacturing systems: Standard Bathroom Accessibility: Yes How Accessible: Accessible via walker Home Adaptive Equipment: None Prior Function Level of Independence: Needs assistance Needs Assistance: Light Housekeeping;Meal Prep;Bathing Bath: Other (comment) (shower transfer) Meal Prep: Maximal Light Housekeeping: Maximal Vocation: On disability Communication Communication: No difficulties Dominant Hand: Right         Vision/Perception Vision - Assessment Eye Alignment: Within Functional Limits Vision Assessment: Vision not tested Perception Perception: Within Functional Limits Praxis Praxis: Intact   Cognition  Overall Cognitive Status: Appears within functional limits for tasks assessed/performed Arousal/Alertness: Awake/alert Orientation Level: Appears intact for tasks assessed Behavior During Session: [facility] for tasks performed    Extremity/Trunk Assessment Right Upper Extremity Assessment RUE ROM/Strength/Tone: Within functional levels RUE Sensation: WFL - Light Touch RUE Coordination: WFL - gross/fine motor Left Upper Extremity Assessment LUE ROM/Strength/Tone: Within functional levels LUE Sensation: WFL - Light Touch LUE Coordination: WFL - gross/fine motor Trunk Assessment Trunk Assessment: Normal     Mobility Bed Mobility Bed Mobility: Supine to  Sit Supine to Sit: 4: Min assist Transfers Transfers: Sit to Stand Sit to Stand: With upper extremity assist;6: Modified independent (Device/Increase time)           Balance Balance Balance Assessed: Yes Dynamic Standing Balance Dynamic Standing - Balance Support: No upper extremity supported Dynamic Standing - Level of Assistance: 7: Independent   End of Session OT - End of Session Activity Tolerance: Patient limited by fatigue Patient left: in bed;with call bell/phone within reach Nurse Communication: Mobility status  GO Functional Assessment Tool Used: clinical judgement Functional Limitation: Self care Self Care Current Status (Z6109): At least 1 percent but less than 20 percent impaired, limited or restricted Self Care Goal Status (U0454): 0 percent impaired, limited or restricted   Melville Engen OTR/L Pager number [phone] 04/14/2012, 3:35 PM

## 2012-04-15 ENCOUNTER — Encounter (HOSPITAL_COMMUNITY): Payer: Self-pay | Admitting: *Deleted

## 2012-04-15 DIAGNOSIS — J441 Chronic obstructive pulmonary disease with (acute) exacerbation: Principal | ICD-10-CM

## 2012-04-15 DIAGNOSIS — F17200 Nicotine dependence, unspecified, uncomplicated: Secondary | ICD-10-CM

## 2012-04-15 DIAGNOSIS — F319 Bipolar disorder, unspecified: Secondary | ICD-10-CM

## 2012-04-15 LAB — GLUCOSE, CAPILLARY: Glucose-Capillary: 241 mg/dL — ABNORMAL HIGH (ref 70–99)

## 2012-04-15 LAB — COMPREHENSIVE METABOLIC PANEL
ALT: 8 U/L (ref 0–35)
AST: 7 U/L (ref 0–37)
Alkaline Phosphatase: 69 U/L (ref 39–117)
CO2: 34 mEq/L — ABNORMAL HIGH (ref 19–32)
Calcium: 9.5 mg/dL (ref 8.4–10.5)
GFR calc Af Amer: >90 mL/min (ref >90)
Glucose, Bld: 195 mg/dL — ABNORMAL HIGH (ref 70–99)
Potassium: 4.3 mEq/L (ref 3.5–5.1)
Sodium: 138 mEq/L (ref 135–145)
Total Protein: 6.5 g/dL (ref 6.0–8.3)

## 2012-04-15 LAB — CBC
Hemoglobin: 13.9 g/dL (ref 12.0–15.0)
MCH: 29.3 pg (ref 26.0–34.0)
Platelets: 144 10*3/uL — ABNORMAL LOW (ref 150–400)
RBC: 4.75 MIL/uL (ref 3.87–5.11)

## 2012-04-15 MED ORDER — ALBUTEROL SULFATE (5 MG/ML) 0.5% IN NEBU
2.5000 mg | INHALATION_SOLUTION | Freq: Four times a day (QID) | RESPIRATORY_TRACT | Status: DC
  Administered 2012-04-15 – 2012-04-16 (×5): 2.5 mg via RESPIRATORY_TRACT
  Filled 2012-04-15 (×5): qty 0.5

## 2012-04-15 MED ORDER — GUAIFENESIN ER 600 MG PO TB12
600.0000 mg | ORAL_TABLET | Freq: Two times a day (BID) | ORAL | Status: DC
  Administered 2012-04-15 – 2012-04-16 (×3): 600 mg via ORAL
  Filled 2012-04-15 (×4): qty 1

## 2012-04-15 NOTE — Progress Notes (Signed)
INITIAL ADULT NUTRITION ASSESSMENT Date: 04/15/2012   Time: 12:53 PM Reason for Assessment: Nutrition Risk and Consult for assessment of pt status and requirements  ASSESSMENT: Female 56 y.o.  Dx: COPD exacerbation  Hx:  Past Medical History  Diagnosis Date  . Hypertension   . Diabetes mellitus   . Bipolar 1 disorder   . Insomnia   . Depression   . Suicidal thoughts   . COPD (chronic obstructive pulmonary disease)   . Complication of anesthesia     Hx resp distress during surgery   Past Surgical History  Procedure Date  . Knee surgery     Left knee tendon surgery  . Finger surgery     middle left finger  . Hernia repair     x 3  . Appendectomy   . Abdominal hysterectomy   . Tubal ligation   . Colon surgery     "colon repair"    Related Meds:     . albuterol  2.5 mg Nebulization Q6H  . albuterol  5 mg Nebulization Once  . amLODipine  5 mg Oral Daily  . budesonide-formoterol  2 puff Inhalation BID  . busPIRone  15 mg Oral BID  . cloNIDine  0.2 mg Oral QHS  . divalproex  1,000 mg Oral BID  . doxepin  150 mg Oral QHS  . enoxaparin (LOVENOX) injection  60 mg Subcutaneous Q24H  . fluticasone  1 puff Inhalation BID  . gabapentin  300 mg Oral TID  . guaiFENesin  600 mg Oral BID  . insulin aspart  0-5 Units Subcutaneous QHS  . insulin aspart  0-9 Units Subcutaneous TID WC  . levofloxacin  750 mg Oral Daily  . levothyroxine  88 mcg Oral QAC breakfast  . lisinopril  20 mg Oral Daily  . methylPREDNISolone (SOLU-MEDROL) injection  60 mg Intravenous Q8H  . mirtazapine  7.5 mg Oral QHS  . nicotine  21 mg Transdermal Daily  . primidone  100 mg Oral QHS  . propranolol  40 mg Oral BID  . sertraline  200 mg Oral Daily  . simvastatin  20 mg Oral QHS  . tiotropium  18 mcg Inhalation Daily  . zolpidem  15 mg Oral QHS  . [DISCONTINUED] albuterol  2.5 mg Nebulization Q4H  . [COMPLETED] nicotine  21 mg Transdermal Once     Ht: 5\' 5"  (165.1 cm)  Wt: 249 lb 1.9 oz (113  kg)  Ideal Wt: 57 kg  % Ideal Wt: 198  Usual Wt:  Wt Readings from Last 10 Encounters:  04/13/12 249 lb 1.9 oz (113 kg)  01/23/12 265 lb (120.203 kg)    % Usual Wt: 94  Body mass index is 41.46 kg/(m^2).-extreme obesity  Labs:  CMP     Component Value Date/Time   NA 138 04/15/2012 0400   K 4.3 04/15/2012 0400   CL 97 04/15/2012 0400   CO2 34* 04/15/2012 0400   GLUCOSE 195* 04/15/2012 0400   BUN 19 04/15/2012 0400   CREATININE 0.42* 04/15/2012 0400   CALCIUM 9.5 04/15/2012 0400   PROT 6.5 04/15/2012 0400   ALBUMIN 3.1* 04/15/2012 0400   AST 7 04/15/2012 0400   ALT 8 04/15/2012 0400   ALKPHOS 69 04/15/2012 0400   BILITOT 0.1* 04/15/2012 0400   GFRNONAA >90 04/15/2012 0400   GFRAA >90 04/15/2012 0400    I/O last 3 completed shifts: In: 1560 [P.O.:1560] Out: 2050 [Urine:2050] Total I/O In: 480 [P.O.:480] Out: 500 [Urine:500]   Diet Order:  Carb Control MED  Supplements/Tube Feeding: none  IVF:    Estimated Nutritional Needs:   Kcal: 1800-1900 Protein: 100-115 gm Fluid: >1.8L  Food/Nutrition Related Hx: Pt states that after she moved to [place] the amount of food stamps that she was eligible for was greatly reduced and has not had money for adequate food.  Was following a regular diet.  Lost 15# in the last 8 weeks.  Pt resumed smoking after moving to [place] as well.  Current intake is 100% of meals.  NUTRITION DIAGNOSIS: -Food and nutrition related knowledge deficit (NB-1.1).  Status: Resolved  RELATED TO: not ready for lifestyle change  AS EVIDENCE BY: reg diet prior to admit  MONITORING/EVALUATION(Goals): Intake, need for further diet education >90% meals  EDUCATION NEEDS: Discussed smoking cessation, basic diabetic diet guidelines, purchasing foods on a budget.  Encouraged continued weight loss and need to consume adequate protein.   INTERVENTION: Continue current diet Educated as above Discussed high protein foods.  Dietitian  [phone]  DOCUMENTATION CODES Per approved criteria  -Extreme Obesity    Derrell Lolling Anastasia Fiedler 04/15/2012, 12:53 PM

## 2012-04-15 NOTE — Progress Notes (Signed)
[facility] PROGRESS NOTE  Jaryah Aracena ZOX:[id number] DOB: 02-11-1956 DOA: 04/13/2012  PCP: Kaleen Mask, MD  Brief HPI: [name] [name] is a 56 y.o. Caucasian female with history of hypertension, type 2 diabetes, bipolar disorder, insomnia, depression, and COPD he presents with the above complaints. Patient has moved to [facility] on August of 2013, prior to that was living in [place]. She reports that she has a history of COPD, has stopped smoking for about 7 months but after she moved to [place] she started smoking again. She has history of chronic low back pain with history of lumbar disk problems which started 7 years ago. Patient has seen her primary care physician and has been referred to [facility] orthopedics for her back pain and knee pain and has an appointment in December. She had worsening back pain which limited her movement at home as a result she presented to the ED for evaluation. In the ED patient was found to be hypoxic with wheezing. The hospitalist service was asked to admit the patient for COPD exacerbation. Patient has not had any fevers, chills, nausea, vomiting, chest, abdominal pain, diarrhea, headaches or vision changes.  Past medical history:  Past Medical History  Diagnosis Date  . Hypertension   . Diabetes mellitus   . Bipolar 1 disorder   . Insomnia   . Depression   . Suicidal thoughts   . COPD (chronic obstructive pulmonary disease)   . Complication of anesthesia     Hx resp distress during surgery    Consultants: None  Procedures: None  Antibiotics: Levaquin 11/22  Subjective: Patient continues to feel better. Still coughing up whitish expectoration. Occasional chest tightness. Wheezing. Back pain is tolerable.  Objective: Vital Signs  Filed Vitals:   04/15/12 0342 04/15/12 0440 04/15/12 0800 04/15/12 1011  BP:  122/68  146/71  Pulse:  63  78  Temp:  98.4 F (36.9 C)    TempSrc:  Oral    Resp: 18 18     Height:      Weight:      SpO2:  94% 88%     Intake/Output Summary (Last 24 hours) at 04/15/12 1111 Last data filed at 04/15/12 0900  Gross per 24 hour  Intake   1320 ml  Output   1950 ml  Net   -630 ml   Filed Weights   04/13/12 1647  Weight: 113 kg (249 lb 1.9 oz)    Intake/Output from previous day: 11/23 0701 - 11/24 0700 In: 1320 [P.O.:1320] Out: 2050 [Urine:2050]  General appearance: alert, cooperative, appears stated age, no distress and moderately obese Head: Normocephalic, without obvious abnormality, atraumatic Back: tenderness to palpation in right lower back. Resp: end exp wheezing bilaterally. no crackles. Improved air entry. Cardio: regular rate and rhythm, S1, S2 normal, no murmur, click, rub or gallop GI: soft, non-tender; bowel sounds normal; no masses,  no organomegaly Extremities: extremities normal, atraumatic, no cyanosis or edema Neurologic: Alert and oriented x 3. No cranial nerve deficits. LE strength lower in right leg compared to left. Gait not assessed.  Lab Results:  Basic Metabolic Panel:  Lab 04/15/12 8119 04/14/12 0407 04/13/12 1200  NA 138 140 140  K 4.3 4.2 4.3  CL 97 98 100  CO2 34* 35* 35*  GLUCOSE 195* 169* 124*  BUN 19 18 15   CREATININE 0.42* 0.36* 0.38*  CALCIUM 9.5 9.2 9.3  MG -- -- --  PHOS -- -- --   Liver Function Tests:  Lab 04/15/12  0400  AST 7  ALT 8  ALKPHOS 69  BILITOT 0.1*  PROT 6.5  ALBUMIN 3.1*   No results found for this basename: LIPASE:5,AMYLASE:5 in the last 168 hours No results found for this basename: AMMONIA:5 in the last 168 hours CBC:  Lab 04/15/12 0400 04/14/12 0407 04/13/12 1200  WBC 7.7 5.2 6.6  NEUTROABS -- -- 3.6  HGB 13.9 13.5 13.4  HCT 42.9 42.6 41.4  MCV 90.3 90.4 90.6  PLT 144* 143* 146*   Cardiac Enzymes:  Lab 04/13/12 1200  CKTOTAL --  CKMB --  CKMBINDEX --  TROPONINI <0.30   BNP (last 3 results)  Basename 04/13/12 1200  PROBNP 218.8*   CBG:  Lab 04/15/12 0736  04/14/12 2037 04/14/12 1738 04/14/12 1120 04/14/12 0804  GLUCAP 184* 250* 185* 215* 182*    No results found for this or any previous visit (from the past 240 hour(s)).    Studies/Results: Dg Chest 2 View  04/13/2012  *RADIOLOGY REPORT*  Clinical Data: Cough, shortness of breath, chest pain  CHEST - 2 VIEW  Comparison: 01/23/2012  Findings: Increased interstitial markings, likely chronic.  No focal consolidation. No pleural effusion or pneumothorax.  Moderate cardiomegaly.  Mild degenerative changes of the visualized thoracolumbar spine.  IMPRESSION: No evidence of acute cardiopulmonary disease.  Moderate cardiomegaly.   Original Report Authenticated By: Charline Bills, M.D.    Dg Lumbar Spine Complete  04/13/2012  *RADIOLOGY REPORT*  Clinical Data: Low back pain without trauma.  Injury a few years ago.  LUMBAR SPINE - COMPLETE 4+ VIEW  Comparison: None.  Findings: Five lumbar type vertebral bodies.  Sacroiliac joints are symmetric.  Maintenance of vertebral body height.  5 mm of anterolisthesis of L4 on L5.  Mild degenerative disease at the lumbosacral junction.  Facet arthropathy at L4-L5 and L5-S1.  IMPRESSION: Lower lumbar spondylosis with trace L4-L5 anterolisthesis. No acute osseous abnormality.   Original Report Authenticated By: Jeronimo Greaves, M.D.     Medications:  Scheduled:    . albuterol  2.5 mg Nebulization Q4H  . albuterol  5 mg Nebulization Once  . amLODipine  5 mg Oral Daily  . budesonide-formoterol  2 puff Inhalation BID  . busPIRone  15 mg Oral BID  . cloNIDine  0.2 mg Oral QHS  . divalproex  1,000 mg Oral BID  . doxepin  150 mg Oral QHS  . enoxaparin (LOVENOX) injection  60 mg Subcutaneous Q24H  . fluticasone  1 puff Inhalation BID  . gabapentin  300 mg Oral TID  . insulin aspart  0-5 Units Subcutaneous QHS  . insulin aspart  0-9 Units Subcutaneous TID WC  . levofloxacin  750 mg Oral Daily  . levothyroxine  88 mcg Oral QAC breakfast  . lisinopril  20 mg Oral  Daily  . methylPREDNISolone (SOLU-MEDROL) injection  60 mg Intravenous Q8H  . mirtazapine  7.5 mg Oral QHS  . nicotine  21 mg Transdermal Daily  . primidone  100 mg Oral QHS  . propranolol  40 mg Oral BID  . sertraline  200 mg Oral Daily  . simvastatin  20 mg Oral QHS  . tiotropium  18 mcg Inhalation Daily  . zolpidem  15 mg Oral QHS  . [COMPLETED] nicotine  21 mg Transdermal Once   Continuous:  ZOX:[id number], acetaminophen, albuterol, HYDROcodone-acetaminophen, ondansetron (ZOFRAN) IV, ondansetron, polyethylene glycol  Assessment/Plan:  Principal Problem:  *COPD exacerbation Active Problems:  Hypertension  Bipolar 1 disorder  Insomnia  Depression  Acute-on-chronic respiratory failure  Low back pain  Thrombocytopenia  DM (diabetes mellitus), type 2, uncontrolled with complications  Tobacco abuse    Acute on chronic hypoxic respiratory failure due to COPD exacerbation  Patient feels slightly better. Continue steroids, antibiotics, nebulizer treatments, Oxygen. RA sats low in the 80's She will need home O2. Patient counseled to stop smoking. Add Mucinex.  Low back pain with weakness in right LE  X-ray of the lumbar spine shows lower lumbars spondylosis with trace L4-L5 anterolisthesis, no acute osseous abnormality. Patient has an appointment with [facility] orthopedics in December. Continue conservative management with physical therapy and pain control. Would like to get MRI but would like for her respiratory status to be a baseline before pursuing. This can be done as outpatient.  Hypertension  Stable. Continue home antihypertensive medications.   Type 2 diabetes uncontrolled with complications  Continue sliding scale insulin. Hold metformin. Hemoglobin A1c is pending. Given patient is on steroids, monitor for hypoglycemia. She is also on some form of Insulin, she thinks it could be Lantus but is not certain.  Depression  Stable. Continue home medications.   Mild  Thrombocytopenia  Unclear etiology. Continue to monitor.    Tobacco abuse  Patient counseled on smoking cessation. Nicotine patchs prescribed.   Hypothyroidism  Continue levothyroxine.   DVT Prophylaxis  Lovenox.   CODE STATUS  DNR/DNI.   Family Communication: Daughter at bedside.  Disposition Plan: Await improvement. PT/OT. Possible discharge in 1-2 days   LOS: 2 days   [facility]  [facility] Pager [phone] 04/15/2012, 11:11 AM  If 8PM-8AM, please contact night-coverage at [email or web address], password [facility]

## 2012-04-15 NOTE — Progress Notes (Signed)
NOTED RT LOWER EXT, WARMER TO TOUCH , SL EDEMA, REDDISH TINT, PT STATES HEAVIER WHEN STANDING THAN BEFORE, MD CALLED ,ORDERS RECEIVED FOR DOPPLER, Keesha Pellum, RN

## 2012-04-15 NOTE — Progress Notes (Signed)
   CARE MANAGEMENT NOTE 04/15/2012  Patient:  [name] [name], [name] [name]   Account Number:  [id number]  Date Initiated:  04/14/2012  Documentation initiated by:  Edd Arbour  Subjective/Objective Assessment:   74 female Medicaid of [place] pt Dtr is primary care giver Has nebulizer and inhalers at home pmh chronic back pain moved to [place] in 12/2011 Acute on chronic hypoxic respiratory failure due to COPD exacerbation No Oxygen at home     Action/Plan:   Cm spoke with pt and daughter Cm spoke with RN -see below comment   Anticipated DC Date:  04/15/2012   Anticipated DC Plan:  HOME/SELF CARE      DC Planning Services  CM consult      Choice offered to / List presented to:  C-1 Patient   DME arranged  [facility]      DME agency  [facility]        Status of service:  In process, will continue to follow Medicare Important Message given?   (If response is "NO", the following Medicare IM given date fields will be blank) Date Medicare IM given:   Date Additional Medicare IM given:    Discharge Disposition:    Per UR Regulation:    If discussed at Long Length of Stay Meetings, dates discussed:    Comments:  04/15/2012 Met with pt and daughter, per MD order and pt qualification, home oxygen ordered from [facility], orders faxed to [phone] for delivery to pt room . [name] Shock RN MPH    04/14/12 1510 Clinton Gallant, RN, CCM [phone] CM noted Cm consult for COPD protocol. CM spoke with pt and Dtr at bedside. Pt confirm no Oxygen at the home.  Pt states she has not been told she needs oxygen for d/c and has not used a home health agency previously.  Dtr inquired about how she could get paid for assisting her mother in the home. CM referred dtr to PDN (private duty nursing) and home health agencies CM left with pt and daughter a list of [place] home health agencies and PDN services Cm spoke with [place] RN about noting in the H&P that MD stated pt may need Oxygen for d/c and  requested RN check pt's Oxygen saturation off of oxygen and after walking to assist with requirements for home oxygen use from a home health agency pending a possible order from attending MD

## 2012-04-16 DIAGNOSIS — I10 Essential (primary) hypertension: Secondary | ICD-10-CM

## 2012-04-16 DIAGNOSIS — M7989 Other specified soft tissue disorders: Secondary | ICD-10-CM

## 2012-04-16 LAB — GLUCOSE, CAPILLARY
Glucose-Capillary: 154 mg/dL — ABNORMAL HIGH (ref 70–99)
Glucose-Capillary: 260 mg/dL — ABNORMAL HIGH (ref 70–99)

## 2012-04-16 MED ORDER — LIVING WELL WITH DIABETES BOOK
Freq: Once | Status: DC
  Filled 2012-04-16: qty 1

## 2012-04-16 MED ORDER — LEVOFLOXACIN 750 MG PO TABS: 750.0000 mg | ORAL_TABLET | Freq: Every day | ORAL | Status: DC

## 2012-04-16 MED ORDER — GUAIFENESIN ER 600 MG PO TB12: 600.0000 mg | ORAL_TABLET | Freq: Two times a day (BID) | ORAL | Status: DC

## 2012-04-16 MED ORDER — ALBUTEROL SULFATE (5 MG/ML) 0.5% IN NEBU: INHALATION_SOLUTION | RESPIRATORY_TRACT | Status: DC

## 2012-04-16 MED ORDER — FLUTICASONE PROPIONATE HFA 110 MCG/ACT IN AERO: 1.0000 | INHALATION_SPRAY | Freq: Two times a day (BID) | RESPIRATORY_TRACT | Status: DC

## 2012-04-16 MED ORDER — PREDNISONE 10 MG PO TABS: ORAL_TABLET | ORAL | Status: DC

## 2012-04-16 MED ORDER — TIOTROPIUM BROMIDE MONOHYDRATE 18 MCG IN CAPS: 18.0000 ug | ORAL_CAPSULE | Freq: Every day | RESPIRATORY_TRACT | Status: DC

## 2012-04-16 MED ORDER — NICOTINE 21 MG/24HR TD PT24: 1.0000 | MEDICATED_PATCH | Freq: Every day | TRANSDERMAL | Status: DC

## 2012-04-16 MED ORDER — HYDROCODONE-ACETAMINOPHEN 5-325 MG PO TABS: 1.0000 | ORAL_TABLET | ORAL | Status: DC | PRN

## 2012-04-16 MED ORDER — PNEUMOCOCCAL VAC POLYVALENT 25 MCG/0.5ML IJ INJ
0.5000 mL | INJECTION | INTRAMUSCULAR | Status: AC
  Administered 2012-04-16: 0.5 mL via INTRAMUSCULAR
  Filled 2012-04-16: qty 0.5

## 2012-04-16 NOTE — Evaluation (Signed)
Physical Therapy Evaluation Patient Details Name: [name] [name] MRN: [id number] DOB: [birth year] Today's Date: 04/16/2012 Time: 4098-1191 PT Time Calculation (min): 39 min  PT Assessment / Plan / Recommendation Clinical Impression  56 yo female with history of back pain and tightness admitted for COPD exacerbation.  Pt was instructed in and given home exercise program for core activation. gentle back stretching and LE strengthening.  If patient will continue exercises on her own, feel she will improve functinally. She does not need further PT follow up or DME    PT Assessment  Patent does not need any further PT services    Follow Up Recommendations  No PT follow up    Does the patient have the potential to tolerate intense rehabilitation      Barriers to Discharge        Equipment Recommendations  None recommended by PT    Recommendations for Other Services     Frequency      Precautions / Restrictions Precautions Precautions: Fall Restrictions Weight Bearing Restrictions: No   Pertinent Vitals/Pain No c/o pain      Mobility  Bed Mobility Bed Mobility: Sit to Supine;Supine to Sit Supine to Sit: 7: Independent Sit to Supine: 7: Independent Transfers Transfers: Sit to Stand;Stand to Sit Sit to Stand: 7: Independent Stand to Sit: 7: Independent Ambulation/Gait Ambulation/Gait Assistance: 6: Modified independent (Device/Increase time);Other (comment) (pt with some dyspnea, walked with supplemental O2) Ambulation Distance (Feet): 300 Feet Assistive device: None Ambulation/Gait Assistance Details: pt with no episodes of balance loss Gait Pattern: Within Functional Limits Gait velocity: decreased  General Gait Details: pt reports increasing pain in low back with walking.  She has decreased trunk rotation and arm swing, but is able to walk without difficulty Stairs: No Wheelchair Mobility Wheelchair Mobility: No    Shoulder Instructions     Exercises Other  Exercises Other Exercises: pt issued beginning core and LE exercise program with hard copy placed in chart.  Pt had some difficulty understanding core activation and abdominal setting and stabalization, but at the end of the session she was able to activate core for stability, especilly in sidelying  hip abduction Other Exercises: pt encouraged to do low back stretching with pelvic tilts and hip flexion from hook lying position   PT Diagnosis:    PT Problem List:   PT Treatment Interventions:     PT Goals    Visit Information  Last PT Received On: 04/16/12 Assistance Needed: +1    Subjective Data  Subjective: I came in because of my back pain Patient Stated Goal: to go home today   Prior Functioning  Home Living Lives With: Daughter Available Help at Discharge: Other (Comment) (daughter in and out at home) Type of Home: Mobile home Home Access: Stairs to enter [employer] of Steps: 4 Entrance Stairs-Rails: Right Home Layout: One level Bathroom Shower/Tub: Engineer, manufacturing systems: Standard Bathroom Accessibility: Yes How Accessible: Accessible via walker Home Adaptive Equipment: None Prior Function Level of Independence: Needs assistance Needs Assistance: Light Housekeeping;Meal Prep;Bathing Bath: Other (comment) (shower transfer) Meal Prep: Maximal Light Housekeeping: Maximal Vocation: On disability Communication Communication: No difficulties Dominant Hand: Right    Cognition  Overall Cognitive Status: Appears within functional limits for tasks assessed/performed Arousal/Alertness: Awake/alert Orientation Level: Oriented X4 / Intact Behavior During Session: [name] [name] for tasks performed    Extremity/Trunk Assessment Right Lower Extremity Assessment RLE ROM/Strength/Tone: Deficits RLE ROM/Strength/Tone Deficits: pt with generalized muscle weakness.  ROM limited by back tightness and soft  tissue approximation RLE Sensation: WFL - Light Touch RLE  Coordination: WFL - gross motor;WFL - gross/fine motor;WFL - fine motor Left Lower Extremity Assessment LLE ROM/Strength/Tone: Deficits LLE ROM/Strength/Tone Deficits: pt with generalized muscle weakness.  ROM limited by back tightness and soft tissue approximation LLE Sensation: WFL - Light Touch;WFL - Proprioception LLE Coordination: WFL - gross/fine motor;WFL - gross motor;WFL - fine motor Trunk Assessment Trunk Exceptions: pt with obese abdomen.She states she has had 3 abdominal hernia repairs and has decreased abd. tone.  Pt also has tightness and stiffness in low back with decrease anterior and posterior pelvic tilt ROM   Balance Balance Balance Assessed: Yes Static Sitting Balance Static Sitting - Balance Support: No upper extremity supported Static Sitting - Level of Assistance: 7: Independent Static Sitting - Comment/# of Minutes: . 10 minutes Static Standing Balance Static Standing - Balance Support: No upper extremity supported;During functional activity Static Standing - Level of Assistance: 7: Independent  End of Session PT - End of Session Equipment Utilized During Treatment: Oxygen Activity Tolerance: Patient tolerated treatment well Patient left: in bed;with family/visitor present Nurse Communication: Mobility status  GP     Bayard Hugger. [place], [place] [phone] 04/16/2012, 12:23 PM

## 2012-04-16 NOTE — Progress Notes (Signed)
VASCULAR LAB PRELIMINARY  PRELIMINARY  PRELIMINARY  PRELIMINARY  Right lower extremity venous duplex completed.    Preliminary report:  Right:  No evidence of DVT, superficial thrombosis, or Baker's cyst.  Kunio Cummiskey, RVT 04/16/2012, 9:16 AM

## 2012-04-16 NOTE — Progress Notes (Signed)
Nurse asked Diabetes Coordinator to see patient prior to discharge as she had some questions regarding diabetes.  In talking with patient, discovered that she has a vial of Novolog insulin brought here from [place] in August.  Does not have any guidelines for use-- daughter, Lysbeth Galas, states that if patient's glucose is over 200 mg/dl she gives her 10 units because she doesn't want to give her too much.  Wants a prescription for a new vial of Novolog if patient is to continue at home.  Discussed with Dr. Rito Ehrlich who prefers for patient to follow-up with Dr. Jeannetta Nap regarding any insulin use at home.  Patient usually doesn't eat breakfast, but drinks coffee with sugar.  Discussed use of sugar substitute in coffee and need to at least a small breakfast to spread calories better throughout the day.    Daughter requested that I talk to Dr. Jeannetta Nap office to explain the concerns about patient's insulin.  [name] called Dr. Jeannetta Nap office and I spoke with a nurse regarding the possibility that the patient's Novolog insulin is de-natured.  Also that it is unclear how often patient has been given insulin at home, and the amount of insulin given is unknown.  Explained that the patient received solumedrol in the hospital and will go home on an insulin taper.  Appointment with Dr. Jeannetta Nap make for Dec 2nd.  Explained to daughter that the steroids will temporarily make her glucose go up, but should improve over time as the prednisone dose is tapered.  Daughter/patient instructed to check CBG's before meals and bedtime and record so she can share with Dr. Jeannetta Nap at patient's appointment.  Samyra Limb S. Elsie Lincoln, RN, CNS, CDE Inpatient Diabetes Program, team pager [phone] or [phone].

## 2012-04-16 NOTE — Discharge Summary (Addendum)
[facility]  Physician Discharge Summary   Patient ID: [name] [name] MRN: [id number] DOB/AGE: [id number] y.o.  Admit date: 04/13/2012 Discharge date: 04/16/2012  PCP: Kaleen Mask, MD  DISCHARGE DIAGNOSES:  Principal Problem:  *COPD exacerbation Active Problems:  Hypertension  Bipolar 1 disorder  Insomnia  Depression  Acute-on-chronic respiratory failure  Low back pain  Thrombocytopenia  DM (diabetes mellitus), type 2, uncontrolled with complications  Tobacco abuse   RECOMMENDATIONS FOR OUTPATIENT FOLLOW UP: 1. Needs close follow up for COPD. She will go home with Oxygen 2. Needs further evaluation of back pain. 2. Patient not very sure about her diabetes management.  DISCHARGE CONDITION: fair  Diet recommendation: Mod Carb  Filed Weights   04/13/12 1647 04/15/12 2051  Weight: 113 kg (249 lb 1.9 oz) 113.002 kg (249 lb 2 oz)    INITIAL HISTORY: [name] [name] is a 56 y.o. Caucasian female with history of hypertension, type 2 diabetes, bipolar disorder, insomnia, depression, and COPD he presents with the above complaints. Patient has moved to [facility] on August of 2013, prior to that was living in [place] state. She reports that she has a history of COPD, has stopped smoking for about 7 months but after she moved to [place] she started smoking again. She has history of chronic low back pain with history of lumbar disk problems which started 7 years ago. Patient has seen her primary care physician and has been referred to [facility] orthopedics for her back pain and knee pain and has an appointment in December. She had worsening back pain which limited her movement at home as a result she presented to the ED for evaluation. In the ED patient was found to be hypoxic with wheezing. The hospitalist service was asked to admit the patient for COPD exacerbation. Patient has not had any fevers, chills, nausea, vomiting, chest, abdominal pain,  diarrhea, headaches or vision changes.  Consultations:  None  Procedures:  None  HOSPITAL COURSE:   Acute on chronic hypoxic respiratory failure due to COPD exacerbation  Patient was initiated on steroids, nebulizer treatments, antibiotics. She was given inhaled steroids as well. She was wheezing quite the significantly at admission. With the treatment as outlined patient started improving. Her wheezing subsided. She was given Mucinex for cough, which seems to be working quite well. She has been ambulating in the hallway with limited shortness of breath. However, she still gets hypoxic with exertion, and, so will be discharged on home oxygen. Her oxygen saturations were less than 85 with ambulation on room air. Overall, patient is significantly improve, and is, stable for discharge. Smoking cessation was once again, emphasized to her.   Low back pain with weakness in right LE  X-ray of the lumbar spine shows lower lumbars spondylosis with trace L4-L5 anterolisthesis, no acute osseous abnormality. Patient has an appointment with [facility] orthopedics in December. Continue conservative management with pain control. Would like to get MRI but would like for her respiratory status to be a baseline before pursuing. This can be done as outpatient. She was also seen by physical therapy during this hospitalization. She has been ambulating with no difficulties. She'll be prescribed some hydrocodone at discharge.  Hypertension  Blood pressure remained stable. She may resume her home regimen.   Type 2 diabetes uncontrolled with complications  She'll resume her metformin and her sliding scale regimen. Her HbA1c was 6.1. She was not sure if she takes any long acting insulin at home. She needs more education regarding her diabetes.  Her blood sugars here have been about 150-250. This is likely due to steroids. As the steroid is tapered down the blood sugar should improve. Apparently patient was using a  Novolog that had expired/old. Its likely it wasn't very effective. It's possible Metformin was controlling her Diabetes adequately. I have encouraged the patient and her daughter to discuss this further with her PCP. I hesitate to prescribe her Insulin as it may precipitate hypoglycemic events.  Depression  Stable. Continue home medications.   Mild Thrombocytopenia  Unclear etiology. Further evaluation can be done as an outpatient.  Tobacco abuse  Patient counseled on smoking cessation. Nicotine patches prescribed.   Hypothyroidism  Continue levothyroxine.   CODE STATUS  Patient expressed a wish to be DNR/DNI.   Overnight the nurse was concerned that her right leg was larger than the left. She also noticed some erythema. Venous Dopplers did not show any DVT. I did not appreciate any erythema in the right leg today. The patient tells me that her right leg is larger than the left ever since she has been having hip problems on the right side. Patient was reassured  Overall, patient has shown significant improvement in her respiratory status. She'll be discharged on nebulizer treatments, oral steroids, and antibiotics. She's already on inhaled steroids. She'll prescribed some pain medications for her back pain. She'll need to follow up with her PCP as early as next week   PERTINENT LABS:  The results of significant diagnostics from this hospitalization (including imaging, microbiology, ancillary and laboratory) are listed below for reference.    Microbiology: No results found for this or any previous visit (from the past 240 hour(s)).   Labs: Basic Metabolic Panel:  Lab 04/15/12 1610 04/14/12 0407 04/13/12 1200  NA 138 140 140  K 4.3 4.2 4.3  CL 97 98 100  CO2 34* 35* 35*  GLUCOSE 195* 169* 124*  BUN 19 18 15   CREATININE 0.42* 0.36* 0.38*  CALCIUM 9.5 9.2 9.3  MG -- -- --  PHOS -- -- --   Liver Function Tests:  Lab 04/15/12 0400  AST 7  ALT 8  ALKPHOS 69  BILITOT 0.1*    PROT 6.5  ALBUMIN 3.1*   No results found for this basename: LIPASE:5,AMYLASE:5 in the last 168 hours No results found for this basename: AMMONIA:5 in the last 168 hours CBC:  Lab 04/15/12 0400 04/14/12 0407 04/13/12 1200  WBC 7.7 5.2 6.6  NEUTROABS -- -- 3.6  HGB 13.9 13.5 13.4  HCT 42.9 42.6 41.4  MCV 90.3 90.4 90.6  PLT 144* 143* 146*   Cardiac Enzymes:  Lab 04/13/12 1200  CKTOTAL --  CKMB --  CKMBINDEX --  TROPONINI <0.30   BNP: BNP (last 3 results)  Basename 04/13/12 1200  PROBNP 218.8*   CBG:  Lab 04/16/12 1141 04/16/12 0718 04/15/12 2126 04/15/12 1647 04/15/12 1205  GLUCAP 260* 154* 241* 225* 253*     IMAGING STUDIES Dg Chest 2 View  04/13/2012  *RADIOLOGY REPORT*  Clinical Data: Cough, shortness of breath, chest pain  CHEST - 2 VIEW  Comparison: 01/23/2012  Findings: Increased interstitial markings, likely chronic.  No focal consolidation. No pleural effusion or pneumothorax.  Moderate cardiomegaly.  Mild degenerative changes of the visualized thoracolumbar spine.  IMPRESSION: No evidence of acute cardiopulmonary disease.  Moderate cardiomegaly.   Original Report Authenticated By: Charline Bills, M.D.    Dg Lumbar Spine Complete  04/13/2012  *RADIOLOGY REPORT*  Clinical Data: Low back pain without trauma.  Injury a few years ago.  LUMBAR SPINE - COMPLETE 4+ VIEW  Comparison: None.  Findings: Five lumbar type vertebral bodies.  Sacroiliac joints are symmetric.  Maintenance of vertebral body height.  5 mm of anterolisthesis of L4 on L5.  Mild degenerative disease at the lumbosacral junction.  Facet arthropathy at L4-L5 and L5-S1.  IMPRESSION: Lower lumbar spondylosis with trace L4-L5 anterolisthesis. No acute osseous abnormality.   Original Report Authenticated By: Jeronimo Greaves, M.D.     DISCHARGE EXAMINATION: Filed Vitals:   04/16/12 0445 04/16/12 0745 04/16/12 1011 04/16/12 1017  BP: 139/82  153/79   Pulse: 98     Temp: 97.6 F (36.4 C)     TempSrc:  Oral     Resp: 17     Height:      Weight:      SpO2: 93% 93%  91%   General appearance: alert, cooperative, appears stated age and no distress Resp: Few scattered wheezes bilaterally. Much improved air entry. No crackles Cardio: regular rate and rhythm, S1, S2 normal, no murmur, click, rub or gallop GI: soft, non-tender; bowel sounds normal; no masses,  no organomegaly Neurologic: Grossly normal Extremities did not show any erythema. The right leg was slightly enlarged compared to the left.  DISPOSITION: Home with daughter  Discharge Orders    Future Appointments: Provider: Department: Dept Phone: Center:   04/17/2012 1:00 PM [facility] [phone] [facility]   05/01/2012 11:30 AM Beverley Fiedler, MD [facility] [phone] [id number]     Future Orders Please Complete By Expires   Diet Carb Modified      Increase activity slowly      Discharge instructions      Comments:   No SMOKING! Follow up with your PCP. Take medications as prescribed. Since the Novolog you were taking at home was old and since HBA1C was 6.1, I am recommending you talk to your PCP before resuming Novolog. Continue to check your CBG at home and if higher than 200 call PCP for instructions.     Current Discharge Medication List    START taking these medications   Details  albuterol (PROVENTIL) (5 MG/ML) 0.5% nebulizer solution 0.20ml nebulized every 8 hours scheduled and every 4 hours as needed. Qty: 20 mL, Refills: 3    guaiFENesin (MUCINEX) 600 MG 12 hr tablet Take 1 tablet (600 mg total) by mouth 2 (two) times daily. Qty: 30 tablet, Refills: 0    HYDROcodone-acetaminophen (NORCO/VICODIN) 5-325 MG per tablet Take 1-2 tablets by mouth every 4 (four) hours as needed for pain. Qty: 30 tablet, Refills: 0    levofloxacin (LEVAQUIN) 750 MG tablet Take 1 tablet (750 mg total) by mouth daily. For 4 days Qty: 4 tablet, Refills: 0    nicotine  (NICODERM CQ - DOSED IN MG/24 HOURS) 21 mg/24hr patch Place 1 patch onto the skin daily. Qty: 28 patch, Refills: 2    predniSONE (DELTASONE) 10 MG tablet Take 4 tablets once daily for 4 days, then 3 tablets once daily for 4 days, then 2 tablets once daily for 4 days, then 1 tablet once daily for 4 days, then STOP Qty: 40 tablet, Refills: 0    tiotropium (SPIRIVA) 18 MCG inhalation capsule Place 1 capsule (18 mcg total) into inhaler and inhale daily. Qty: 30 capsule, Refills: 3      CONTINUE these medications which have CHANGED   Details  fluticasone (FLOVENT HFA) 110 MCG/ACT inhaler Inhale 1 puff into  the lungs 2 (two) times daily. Qty: 1 Inhaler, Refills: 3      CONTINUE these medications which have NOT CHANGED   Details  amLODipine (NORVASC) 10 MG tablet Take 5 mg by mouth daily.    busPIRone (BUSPAR) 15 MG tablet Take 15 mg by mouth 2 (two) times daily.    cloNIDine (CATAPRES) 0.1 MG tablet Take 0.2 mg by mouth at bedtime.    divalproex (DEPAKOTE ER) 500 MG 24 hr tablet Take 1,000 mg by mouth 2 (two) times daily.    doxepin (SINEQUAN) 50 MG capsule Take 150 mg by mouth at bedtime.    gabapentin (NEURONTIN) 300 MG capsule Take 300 mg by mouth 3 (three) times daily.    levothyroxine (SYNTHROID, LEVOTHROID) 88 MCG tablet Take 88 mcg by mouth daily.    lisinopril (PRINIVIL,ZESTRIL) 40 MG tablet Take 20 mg by mouth daily.    metFORMIN (GLUCOPHAGE) 850 MG tablet Take 850 mg by mouth 2 (two) times daily with a meal.    mirtazapine (REMERON) 7.5 MG tablet Take 7.5 mg by mouth at bedtime.    primidone (MYSOLINE) 50 MG tablet Take 100 mg by mouth at bedtime.     propranolol (INDERAL) 40 MG tablet Take 40 mg by mouth 2 (two) times daily.    sertraline (ZOLOFT) 100 MG tablet Take 200 mg by mouth daily.    simvastatin (ZOCOR) 40 MG tablet Take 20 mg by mouth at bedtime.    zolpidem (AMBIEN) 10 MG tablet Take 15 mg by mouth at bedtime.    glucose monitoring kit (FREESTYLE)  monitoring kit 1 each by Does not apply route as needed for other. Qty: 1 each, Refills: 1      STOP taking these medications     budesonide-formoterol (SYMBICORT) 160-4.5 MCG/ACT inhaler      insulin aspart (NOVOLOG) 100 UNIT/ML injection        Follow-up Information    Follow up with Kaleen Mask, MD. Schedule an appointment as soon as possible for a visit in 1 week.   Contact information:   [address] [place] [address] [phone]          TOTAL DISCHARGE TIME: 35 mins  [facility]  [facility] Pager [phone]  04/16/2012, 1:40 PM

## 2012-04-16 NOTE — Progress Notes (Signed)
Occupational Therapy Treatment and Discharge Summary Patient Details Name: [name] [name] MRN: [id number] DOB: 26-Jan-1956 Today's Date: 04/16/2012 Time: 1100-1108 and 1110-1130 OT Time Calculation (min):28 min  OT Assessment / Plan / Recommendation Comments on Treatment Session      Follow Up Recommendations  No OT follow up    Barriers to Discharge       Equipment Recommendations  Other (comment) (pt will get tub bench on her own)    Recommendations for Other Services    Frequency     Plan      Precautions / Restrictions Precautions Precautions: Fall Restrictions Weight Bearing Restrictions: No   Pertinent Vitals/Pain No pain    ADL  Toilet Transfer: Simulated;Independent Toilet Transfer Method: Stand Wellsite geologist:  (bed to chair) Transfers/Ambulation Related to ADLs: educated on tub bench and resource list given.  Pt wants to price them and then will get one.  independent with bed mobility and transfer ADL Comments: pt reports she and daughter practiced with ae, and she feels comfortable with this.  Brought tub bench down to demonstrate for pt.  Also gave energy conservation sheets and reviewed them.  Pt plans to d/c today.  No further OT needs at this time.      OT Diagnosis:    OT Problem List:   OT Treatment Interventions:     OT Goals ADL Goals Pt Will Perform Lower Body Dressing: with modified independence;Sit to stand from chair;with adaptive equipment ADL Goal: Lower Body Dressing - Progress: Other (comment) (reports mod I) Pt Will Perform Tub/Shower Transfer: with modified independence;Transfer tub bench ADL Goal: Tub/Shower Transfer - Progress: Other (comment) (verbalizes use of tub bench) Miscellaneous OT Goals Miscellaneous OT Goal #1: Pt will state at least 3 energy conservation/ back protection recommendations with independence to increase safety and performance during ADLS. OT Goal: Miscellaneous Goal #1 - Progress: Other  (comment) (reviewed and pt verbalizes understanding)  Visit Information  Last OT Received On: 04/16/12 Assistance Needed: +1 Reason Eval/Treat Not Completed:  (also 1110 - 1130 tx time)    Subjective Data      Prior Functioning  Home Living Lives With: Daughter Available Help at Discharge: Other (Comment) (daughter in and out at home) Type of Home: Mobile home Home Access: Stairs to enter [employer] of Steps: 4 Entrance Stairs-Rails: Right Home Layout: One level Bathroom Shower/Tub: Engineer, manufacturing systems: Standard Bathroom Accessibility: Yes How Accessible: Accessible via walker Home Adaptive Equipment: None Prior Function Level of Independence: Needs assistance Needs Assistance: Light Housekeeping;Meal Prep;Bathing Bath: Other (comment) (shower transfer) Meal Prep: Maximal Light Housekeeping: Maximal Vocation: On disability Communication Communication: No difficulties Dominant Hand: Right    Cognition  Overall Cognitive Status: Appears within functional limits for tasks assessed/performed Arousal/Alertness: Awake/alert Orientation Level: Oriented X4 / Intact Behavior During Session: [facility] for tasks performed    Mobility  Shoulder Instructions Bed Mobility Supine to Sit: 7: Independent Transfers Transfers: Sit to Stand Sit to Stand: 7: Independent;From bed;With upper extremity assist       Exercises      Balance     End of Session OT - End of Session Activity Tolerance: Patient tolerated treatment well Patient left: in chair;with call bell/phone within reach  GO     [facility] 04/16/2012, 11:37 AM Marica Otter, OTR/L [phone] 04/16/2012

## 2012-04-17 ENCOUNTER — Telehealth: Payer: Self-pay | Admitting: *Deleted

## 2012-04-17 NOTE — Progress Notes (Signed)
04/16/2012 Colleen Can BSN RN CCM [phone] Pt ofr discharge today. Pt has been cpontacted regarding oxygen set up at her home per [facility] dme. Protable O2 tank has been delivered to pt's room. There are no HH care orders for home health services. Plans are for her to return to her home in [place] where daughter will be caregiver.

## 2012-04-17 NOTE — Telephone Encounter (Signed)
Pt did not come for PV today for colonoscopy scheduled for 12/10.  Pt says she was recently in the hospital for respiratory problems and is currently on home oxygen.  She would like to reschedule colonoscopy after she feels better.  I told pt that she would need an office visit before scheduling colonoscopy due to respiratory issues.  Pt is agreeable.  Colonoscopy scheduled for 12/10 cancelled.

## 2012-05-01 ENCOUNTER — Encounter: Payer: PRIVATE HEALTH INSURANCE | Admitting: Internal Medicine

## 2012-05-11 ENCOUNTER — Ambulatory Visit: Payer: PRIVATE HEALTH INSURANCE | Admitting: *Deleted

## 2012-06-01 ENCOUNTER — Ambulatory Visit: Payer: Medicaid Other | Admitting: Physical Therapy

## 2012-06-04 ENCOUNTER — Ambulatory Visit: Payer: PRIVATE HEALTH INSURANCE

## 2012-06-06 ENCOUNTER — Ambulatory Visit: Payer: Medicaid Other | Attending: Orthopedic Surgery | Admitting: Physical Therapy

## 2012-06-06 DIAGNOSIS — IMO0001 Reserved for inherently not codable concepts without codable children: Secondary | ICD-10-CM | POA: Insufficient documentation

## 2012-06-06 DIAGNOSIS — M545 Low back pain, unspecified: Secondary | ICD-10-CM | POA: Insufficient documentation

## 2012-06-06 DIAGNOSIS — M25659 Stiffness of unspecified hip, not elsewhere classified: Secondary | ICD-10-CM | POA: Insufficient documentation

## 2012-06-13 ENCOUNTER — Ambulatory Visit: Payer: Medicaid Other | Admitting: Rehabilitative and Restorative Service Providers"

## 2012-06-20 ENCOUNTER — Encounter: Payer: PRIVATE HEALTH INSURANCE | Admitting: Physical Therapy

## 2012-06-27 ENCOUNTER — Ambulatory Visit: Payer: PRIVATE HEALTH INSURANCE | Admitting: Physical Therapy

## 2012-07-08 ENCOUNTER — Encounter (HOSPITAL_COMMUNITY): Payer: Self-pay

## 2012-07-08 ENCOUNTER — Emergency Department (HOSPITAL_COMMUNITY): Payer: Medicaid Other

## 2012-07-08 ENCOUNTER — Inpatient Hospital Stay (HOSPITAL_COMMUNITY)
Admission: EM | Admit: 2012-07-08 | Discharge: 2012-07-11 | DRG: 189 | Disposition: A | Discharge status: Home / Self Care | Payer: Medicaid Other | Attending: Internal Medicine | Admitting: Internal Medicine

## 2012-07-08 DIAGNOSIS — M545 Low back pain: Secondary | ICD-10-CM

## 2012-07-08 DIAGNOSIS — J9600 Acute respiratory failure, unspecified whether with hypoxia or hypercapnia: Principal | ICD-10-CM

## 2012-07-08 DIAGNOSIS — F1511 Other stimulant abuse, in remission: Secondary | ICD-10-CM | POA: Diagnosis present

## 2012-07-08 DIAGNOSIS — E119 Type 2 diabetes mellitus without complications: Secondary | ICD-10-CM | POA: Diagnosis present

## 2012-07-08 DIAGNOSIS — F17200 Nicotine dependence, unspecified, uncomplicated: Secondary | ICD-10-CM | POA: Diagnosis present

## 2012-07-08 DIAGNOSIS — J441 Chronic obstructive pulmonary disease with (acute) exacerbation: Secondary | ICD-10-CM | POA: Diagnosis present

## 2012-07-08 DIAGNOSIS — E039 Hypothyroidism, unspecified: Secondary | ICD-10-CM | POA: Diagnosis present

## 2012-07-08 DIAGNOSIS — D696 Thrombocytopenia, unspecified: Secondary | ICD-10-CM

## 2012-07-08 DIAGNOSIS — Z720 Tobacco use: Secondary | ICD-10-CM

## 2012-07-08 DIAGNOSIS — J9620 Acute and chronic respiratory failure, unspecified whether with hypoxia or hypercapnia: Secondary | ICD-10-CM

## 2012-07-08 DIAGNOSIS — F329 Major depressive disorder, single episode, unspecified: Secondary | ICD-10-CM

## 2012-07-08 DIAGNOSIS — E785 Hyperlipidemia, unspecified: Secondary | ICD-10-CM | POA: Diagnosis present

## 2012-07-08 DIAGNOSIS — F319 Bipolar disorder, unspecified: Secondary | ICD-10-CM

## 2012-07-08 DIAGNOSIS — J9601 Acute respiratory failure with hypoxia: Secondary | ICD-10-CM

## 2012-07-08 DIAGNOSIS — R45851 Suicidal ideations: Secondary | ICD-10-CM

## 2012-07-08 DIAGNOSIS — F3130 Bipolar disorder, current episode depressed, mild or moderate severity, unspecified: Secondary | ICD-10-CM | POA: Diagnosis present

## 2012-07-08 DIAGNOSIS — Z9089 Acquired absence of other organs: Secondary | ICD-10-CM

## 2012-07-08 DIAGNOSIS — I10 Essential (primary) hypertension: Secondary | ICD-10-CM | POA: Diagnosis present

## 2012-07-08 DIAGNOSIS — Z885 Allergy status to narcotic agent status: Secondary | ICD-10-CM

## 2012-07-08 DIAGNOSIS — Z90710 Acquired absence of both cervix and uterus: Secondary | ICD-10-CM

## 2012-07-08 DIAGNOSIS — Z79899 Other long term (current) drug therapy: Secondary | ICD-10-CM

## 2012-07-08 DIAGNOSIS — G4700 Insomnia, unspecified: Secondary | ICD-10-CM

## 2012-07-08 DIAGNOSIS — F1011 Alcohol abuse, in remission: Secondary | ICD-10-CM | POA: Diagnosis present

## 2012-07-08 HISTORY — DX: Hyperlipidemia, unspecified: E78.5

## 2012-07-08 HISTORY — DX: Hypothyroidism, unspecified: E03.9

## 2012-07-08 HISTORY — DX: Type 2 diabetes mellitus without complications: E11.9

## 2012-07-08 HISTORY — DX: Other psychoactive substance abuse, uncomplicated: F19.10

## 2012-07-08 HISTORY — DX: Acute respiratory failure with hypoxia: J96.01

## 2012-07-08 LAB — CBC
MCH: 29.1 pg (ref 26.0–34.0)
MCV: 89.9 fL (ref 78.0–100.0)
Platelets: 158 10*3/uL (ref 150–400)
RDW: 14 % (ref 11.5–15.5)

## 2012-07-08 LAB — BASIC METABOLIC PANEL
Calcium: 9.2 mg/dL (ref 8.4–10.5)
Creatinine, Ser: 0.41 mg/dL — ABNORMAL LOW (ref 0.50–1.10)
GFR calc Af Amer: >90 mL/min (ref >90)

## 2012-07-08 MED ORDER — AMLODIPINE BESYLATE 10 MG PO TABS
10.0000 mg | ORAL_TABLET | Freq: Every morning | ORAL | Status: DC
  Administered 2012-07-09 – 2012-07-11 (×3): 10 mg via ORAL
  Filled 2012-07-08 (×3): qty 1

## 2012-07-08 MED ORDER — ZOLPIDEM TARTRATE 10 MG PO TABS
10.0000 mg | ORAL_TABLET | Freq: Every day | ORAL | Status: DC
  Administered 2012-07-08 – 2012-07-10 (×3): 10 mg via ORAL
  Filled 2012-07-08 (×3): qty 1

## 2012-07-08 MED ORDER — BUSPIRONE HCL 15 MG PO TABS
15.0000 mg | ORAL_TABLET | Freq: Two times a day (BID) | ORAL | Status: DC
  Administered 2012-07-08 – 2012-07-11 (×6): 15 mg via ORAL
  Filled 2012-07-08 (×7): qty 1

## 2012-07-08 MED ORDER — CLONIDINE HCL 0.1 MG PO TABS
0.1000 mg | ORAL_TABLET | Freq: Every day | ORAL | Status: DC
  Administered 2012-07-08 – 2012-07-09 (×2): 0.1 mg via ORAL
  Filled 2012-07-08 (×4): qty 1

## 2012-07-08 MED ORDER — SIMVASTATIN 40 MG PO TABS: 40.0000 mg | ORAL_TABLET | Freq: Every day | ORAL | Status: DC

## 2012-07-08 MED ORDER — ALPRAZOLAM 0.5 MG PO TABS
0.5000 mg | ORAL_TABLET | Freq: Once | ORAL | Status: AC
  Administered 2012-07-08: 0.5 mg via ORAL
  Filled 2012-07-08: qty 1

## 2012-07-08 MED ORDER — DIVALPROEX SODIUM ER 500 MG PO TB24
1000.0000 mg | ORAL_TABLET | Freq: Two times a day (BID) | ORAL | Status: DC
  Administered 2012-07-08 – 2012-07-11 (×6): 1000 mg via ORAL
  Filled 2012-07-08 (×7): qty 2

## 2012-07-08 MED ORDER — METFORMIN HCL 850 MG PO TABS
850.0000 mg | ORAL_TABLET | Freq: Two times a day (BID) | ORAL | Status: DC
  Administered 2012-07-09 – 2012-07-11 (×5): 850 mg via ORAL
  Filled 2012-07-08 (×8): qty 1

## 2012-07-08 MED ORDER — GUAIFENESIN-DM 100-10 MG/5ML PO SYRP
5.0000 mL | ORAL_SOLUTION | ORAL | Status: DC | PRN
  Administered 2012-07-08: 22:00:00 via ORAL
  Administered 2012-07-09 – 2012-07-10 (×4): 5 mL via ORAL
  Filled 2012-07-08 (×5): qty 10

## 2012-07-08 MED ORDER — ATORVASTATIN CALCIUM 20 MG PO TABS
20.0000 mg | ORAL_TABLET | Freq: Every day | ORAL | Status: DC
  Administered 2012-07-08 – 2012-07-10 (×3): 20 mg via ORAL
  Filled 2012-07-08 (×4): qty 1

## 2012-07-08 MED ORDER — ENOXAPARIN SODIUM 40 MG/0.4ML ~~LOC~~ SOLN: 40.0000 mg | SUBCUTANEOUS | Status: DC

## 2012-07-08 MED ORDER — MIRTAZAPINE 15 MG PO TABS
15.0000 mg | ORAL_TABLET | Freq: Every day | ORAL | Status: DC
  Administered 2012-07-08 – 2012-07-10 (×3): 15 mg via ORAL
  Filled 2012-07-08 (×4): qty 1

## 2012-07-08 MED ORDER — SODIUM CHLORIDE 0.9 % IJ SOLN
3.0000 mL | Freq: Two times a day (BID) | INTRAMUSCULAR | Status: DC
  Administered 2012-07-08 – 2012-07-10 (×5): 3 mL via INTRAVENOUS
  Administered 2012-07-11: 10 mL via INTRAVENOUS

## 2012-07-08 MED ORDER — ENOXAPARIN SODIUM 60 MG/0.6ML ~~LOC~~ SOLN
60.0000 mg | SUBCUTANEOUS | Status: DC
  Administered 2012-07-08 – 2012-07-10 (×3): 60 mg via SUBCUTANEOUS
  Filled 2012-07-08 (×4): qty 0.6

## 2012-07-08 MED ORDER — GUAIFENESIN ER 600 MG PO TB12
600.0000 mg | ORAL_TABLET | Freq: Two times a day (BID) | ORAL | Status: DC
  Administered 2012-07-08 – 2012-07-11 (×6): 600 mg via ORAL
  Filled 2012-07-08 (×7): qty 1

## 2012-07-08 MED ORDER — ALUM & MAG HYDROXIDE-SIMETH 200-200-20 MG/5ML PO SUSP: 30.0000 mL | Freq: Four times a day (QID) | ORAL | Status: DC | PRN

## 2012-07-08 MED ORDER — LISINOPRIL 10 MG PO TABS
10.0000 mg | ORAL_TABLET | Freq: Every morning | ORAL | Status: DC
  Administered 2012-07-09 – 2012-07-11 (×3): 10 mg via ORAL
  Filled 2012-07-08 (×3): qty 1

## 2012-07-08 MED ORDER — SODIUM CHLORIDE 0.9 % IV SOLN: 250.0000 mL | INTRAVENOUS | Status: DC | PRN

## 2012-07-08 MED ORDER — METHYLPREDNISOLONE SODIUM SUCC 40 MG IJ SOLR
40.0000 mg | Freq: Three times a day (TID) | INTRAMUSCULAR | Status: DC
  Administered 2012-07-08 – 2012-07-10 (×5): 40 mg via INTRAVENOUS
  Filled 2012-07-08 (×8): qty 1

## 2012-07-08 MED ORDER — SODIUM CHLORIDE 0.9 % IV SOLN
INTRAVENOUS | Status: AC
  Administered 2012-07-08: 16:00:00 via INTRAVENOUS

## 2012-07-08 MED ORDER — PROPRANOLOL HCL 40 MG PO TABS
40.0000 mg | ORAL_TABLET | Freq: Two times a day (BID) | ORAL | Status: DC
  Administered 2012-07-08 – 2012-07-11 (×5): 40 mg via ORAL
  Filled 2012-07-08 (×7): qty 1

## 2012-07-08 MED ORDER — SERTRALINE HCL 100 MG PO TABS
100.0000 mg | ORAL_TABLET | Freq: Every morning | ORAL | Status: DC
  Administered 2012-07-09 – 2012-07-11 (×3): 100 mg via ORAL
  Filled 2012-07-08 (×3): qty 1

## 2012-07-08 MED ORDER — DOXEPIN HCL 75 MG PO CAPS
150.0000 mg | ORAL_CAPSULE | Freq: Every day | ORAL | Status: DC
  Administered 2012-07-08 – 2012-07-10 (×3): 150 mg via ORAL
  Filled 2012-07-08 (×5): qty 2

## 2012-07-08 MED ORDER — ONDANSETRON HCL 4 MG PO TABS: 4.0000 mg | ORAL_TABLET | Freq: Four times a day (QID) | ORAL | Status: DC | PRN

## 2012-07-08 MED ORDER — ONDANSETRON HCL 4 MG/2ML IJ SOLN: 4.0000 mg | Freq: Four times a day (QID) | INTRAMUSCULAR | Status: DC | PRN

## 2012-07-08 MED ORDER — ALBUTEROL SULFATE (5 MG/ML) 0.5% IN NEBU
5.0000 mg | INHALATION_SOLUTION | Freq: Once | RESPIRATORY_TRACT | Status: AC
  Administered 2012-07-08: 5 mg via RESPIRATORY_TRACT

## 2012-07-08 MED ORDER — ALBUTEROL SULFATE (5 MG/ML) 0.5% IN NEBU
5.0000 mg | INHALATION_SOLUTION | Freq: Once | RESPIRATORY_TRACT | Status: AC
  Administered 2012-07-08: 5 mg via RESPIRATORY_TRACT
  Filled 2012-07-08: qty 1

## 2012-07-08 MED ORDER — ALBUTEROL SULFATE (5 MG/ML) 0.5% IN NEBU
2.5000 mg | INHALATION_SOLUTION | RESPIRATORY_TRACT | Status: AC | PRN
  Filled 2012-07-08: qty 0.5

## 2012-07-08 MED ORDER — ACETAMINOPHEN 325 MG PO TABS: 650.0000 mg | ORAL_TABLET | Freq: Four times a day (QID) | ORAL | Status: DC | PRN

## 2012-07-08 MED ORDER — ONDANSETRON HCL 4 MG/2ML IJ SOLN: 4.0000 mg | Freq: Three times a day (TID) | INTRAMUSCULAR | Status: DC | PRN

## 2012-07-08 MED ORDER — POLYETHYLENE GLYCOL 3350 17 G PO PACK
17.0000 g | PACK | Freq: Every day | ORAL | Status: DC | PRN
  Filled 2012-07-08: qty 1

## 2012-07-08 MED ORDER — SODIUM CHLORIDE 0.9 % IJ SOLN: 3.0000 mL | INTRAMUSCULAR | Status: DC | PRN

## 2012-07-08 MED ORDER — IPRATROPIUM BROMIDE 0.02 % IN SOLN
0.5000 mg | Freq: Once | RESPIRATORY_TRACT | Status: AC
  Administered 2012-07-08: 0.5 mg via RESPIRATORY_TRACT

## 2012-07-08 MED ORDER — INSULIN ASPART 100 UNIT/ML ~~LOC~~ SOLN
0.0000 [IU] | Freq: Three times a day (TID) | SUBCUTANEOUS | Status: DC
  Administered 2012-07-09: 3 [IU] via SUBCUTANEOUS
  Administered 2012-07-09: 5 [IU] via SUBCUTANEOUS
  Administered 2012-07-09 – 2012-07-10 (×2): 2 [IU] via SUBCUTANEOUS
  Administered 2012-07-10: 3 [IU] via SUBCUTANEOUS
  Administered 2012-07-11: 8 [IU] via SUBCUTANEOUS
  Administered 2012-07-11: 2 [IU] via SUBCUTANEOUS

## 2012-07-08 MED ORDER — LEVOFLOXACIN IN D5W 750 MG/150ML IV SOLN
750.0000 mg | INTRAVENOUS | Status: DC
  Administered 2012-07-08 – 2012-07-09 (×2): 750 mg via INTRAVENOUS
  Filled 2012-07-08 (×2): qty 150

## 2012-07-08 MED ORDER — PREDNISONE 20 MG PO TABS
60.0000 mg | ORAL_TABLET | Freq: Once | ORAL | Status: AC
  Administered 2012-07-08: 60 mg via ORAL
  Filled 2012-07-08: qty 3

## 2012-07-08 MED ORDER — LEVOTHYROXINE SODIUM 88 MCG PO TABS
88.0000 ug | ORAL_TABLET | Freq: Every day | ORAL | Status: DC
  Administered 2012-07-09 – 2012-07-11 (×3): 88 ug via ORAL
  Filled 2012-07-08 (×5): qty 1

## 2012-07-08 MED ORDER — TIOTROPIUM BROMIDE MONOHYDRATE 18 MCG IN CAPS
18.0000 ug | ORAL_CAPSULE | Freq: Every day | RESPIRATORY_TRACT | Status: DC
  Administered 2012-07-08 – 2012-07-11 (×4): 18 ug via RESPIRATORY_TRACT
  Filled 2012-07-08: qty 5

## 2012-07-08 MED ORDER — PRIMIDONE 50 MG PO TABS
150.0000 mg | ORAL_TABLET | Freq: Every day | ORAL | Status: DC
Start: 2012-07-08 — End: 2012-07-11
  Administered 2012-07-08 – 2012-07-10 (×3): 150 mg via ORAL
  Filled 2012-07-08 (×4): qty 3

## 2012-07-08 MED ORDER — ACETAMINOPHEN 650 MG RE SUPP: 650.0000 mg | Freq: Four times a day (QID) | RECTAL | Status: DC | PRN

## 2012-07-08 MED ORDER — ALBUTEROL SULFATE (5 MG/ML) 0.5% IN NEBU
2.5000 mg | INHALATION_SOLUTION | RESPIRATORY_TRACT | Status: DC | PRN
  Administered 2012-07-08 – 2012-07-10 (×5): 2.5 mg via RESPIRATORY_TRACT
  Filled 2012-07-08 (×4): qty 0.5

## 2012-07-08 MED ORDER — INSULIN ASPART 100 UNIT/ML ~~LOC~~ SOLN: 0.0000 [IU] | Freq: Every day | SUBCUTANEOUS | Status: DC

## 2012-07-08 NOTE — Progress Notes (Signed)
Rx Brief Lovenox note  Wt= 110 kg BMI=40 Plts, CrCl WNL  Change Lovenox to 60mg  SQ daily for DVT prophylaxis in pt with BMI>30  Lorenza Evangelist 07/08/2012 7:37 PM

## 2012-07-08 NOTE — ED Provider Notes (Signed)
Medical screening examination/treatment/procedure(s) were performed by non-physician practitioner and as supervising physician I was immediately available for consultation/collaboration.   David H Yao, MD 07/08/12 1553 

## 2012-07-08 NOTE — ED Notes (Addendum)
Send from Urgent Care (sat was 85%), SOB worsen today, bilateral lung wheezing, coughing, no relief from home nebulizer. Hx of COPD, chronic smoker, sat low 90s on Room air but would occasionally drop down to 80s. Pt placed on 2 L [place].

## 2012-07-08 NOTE — H&P (Signed)
[facility] History and Physical  [name] [name] ZOX:[id number] DOB: 1956-03-31 DOA: 07/08/2012  Referring physician: Dr. Chaney Malling PCP: Kaleen Mask, MD   Chief Complaint: Dyspnea   History of Present Illness: [name] [name] is an 57 y.o. female with a PMH of COPD (not currently on home oxygen) and ongoing tobacco abuse who developed flu like symptoms 3 days ago with cough (non-productive), pharyngitis/hoarseness secondary to cough, nasal congestion and chest congestion but no frank fever or myalgias.  The patient used Daquil and Severe Cough and Cold Medicine (OTC) with no relief of cough or chest congestion (but nasal congestion improved).  She came to the ER for evaluation secondary to worsening dyspnea and wheeze.  Upon initial evaluation in the ER, she was found to have acute respiratory failure with wheezing, and was referred to the hospitalist service.  Review of Systems: Constitutional: No fever, no chills;  Appetite normal; + intentional weight loss, no weight gain.  HEENT: No blurry vision, no diplopia, + pharyngitis, no dysphagia CV: No chest pain, no palpitations.  Resp: + SOB and cough. GI: No nausea, no vomiting, + chronic diarrhea x 3 months, scheduled for colonoscopy on 2/06/23/12 to assess, no melena, no hematochezia.  GU: No dysuria, no hematuria.  MSK: no myalgias, no arthralgias.  Neuro:  No headache, no focal neurological deficits, no history of seizures.  Psych: + depression, no anxiety.  Endo: + thyroid disease, + DM, no heat intolerance, no cold intolerance, no polyuria, no polydipsia  Skin: No rashes, no skin lesions.  Heme: No easy bruising, no history of blood diseases.  Past Medical History Past Medical History  Diagnosis Date  . Hypertension   . Bipolar 1 disorder   . Insomnia   . Depression   . Suicidal thoughts   . COPD (chronic obstructive pulmonary disease)   . Complication of anesthesia     Hx resp distress during surgery  . Diabetes  mellitus   . Hypothyroidism   . Hyperlipidemia   . Polysubstance abuse     H/O methamphetamine and alcohol abuse, clean x 6 years     Past Surgical History Past Surgical History  Procedure Laterality Date  . Knee surgery      Left knee tendon surgery  . Finger surgery      middle left finger  . Hernia repair      x 3  . Appendectomy    . Abdominal hysterectomy    . Tubal ligation    . Colon surgery      "colon repair"     Social History: History   Social History  . Marital Status: Divorced    Spouse Name: N/A    Number of Children: 2  . Years of Education: N/A   Occupational History  . Disabled.    Social History Main Topics  . Smoking status: Current Every Day Smoker -- 1.00 packs/day for 42 years  . Smokeless tobacco: Never Used  . Alcohol Use: No     Comment: H/O alcoholism, clean x 6 years.  . Drug Use: No     Comment: high school - speed, hx prescription drugs 04/13/12 - no drug use now  . Sexually Active: Not on file   Other Topics Concern  . Not on file   Social History Narrative   Divorced.  Lives with daughter in [place].  Patient has remained sober from alcohol and amphetamine for about 6 years.    Family History:  Family History  Problem Relation  Age of Onset  . Multiple sclerosis Mother   . Alcoholism Father   . Depression Brother     Allergies: Morphine and related  Meds: Prior to Admission medications   Medication Sig Start Date End Date Taking? Authorizing Provider  albuterol (PROVENTIL) (5 MG/ML) 0.5% nebulizer solution Take 2.5 mg by nebulization every 6 (six) hours as needed for wheezing or shortness of breath. 04/16/12  Yes Osvaldo Shipper, MD  amLODipine (NORVASC) 10 MG tablet Take 10 mg by mouth every morning.    Yes Historical Provider, MD  busPIRone (BUSPAR) 15 MG tablet Take 15 mg by mouth 2 (two) times daily.   Yes Historical Provider, MD  cloNIDine (CATAPRES) 0.1 MG tablet Take 0.1 mg by mouth at bedtime.    Yes  Historical Provider, MD  divalproex (DEPAKOTE ER) 500 MG 24 hr tablet Take 1,000 mg by mouth 2 (two) times daily.   Yes Historical Provider, MD  doxepin (SINEQUAN) 50 MG capsule Take 150 mg by mouth at bedtime.   Yes Historical Provider, MD  glucose monitoring kit (FREESTYLE) monitoring kit 1 each by Does not apply route as needed for other. 01/23/12 01/22/13 Yes Lyanne Co, MD  levothyroxine (SYNTHROID, LEVOTHROID) 88 MCG tablet Take 88 mcg by mouth daily before breakfast.    Yes Historical Provider, MD  lisinopril (PRINIVIL,ZESTRIL) 10 MG tablet Take 10 mg by mouth every morning.   Yes Historical Provider, MD  metFORMIN (GLUCOPHAGE) 850 MG tablet Take 850 mg by mouth 2 (two) times daily with a meal.   Yes Historical Provider, MD  mirtazapine (REMERON) 15 MG tablet Take 15 mg by mouth at bedtime.   Yes Historical Provider, MD  primidone (MYSOLINE) 50 MG tablet Take 150 mg by mouth at bedtime.    Yes Historical Provider, MD  propranolol (INDERAL) 40 MG tablet Take 40 mg by mouth 2 (two) times daily.   Yes Historical Provider, MD  sertraline (ZOLOFT) 100 MG tablet Take 100 mg by mouth every morning.    Yes Historical Provider, MD  simvastatin (ZOCOR) 40 MG tablet Take 40 mg by mouth at bedtime.    Yes Historical Provider, MD  tiotropium (SPIRIVA) 18 MCG inhalation capsule Place 1 capsule (18 mcg total) into inhaler and inhale daily. 04/16/12  Yes Osvaldo Shipper, MD  zolpidem (AMBIEN) 10 MG tablet Take 15 mg by mouth at bedtime.   Yes Historical Provider, MD    Physical Exam: Filed Vitals:   07/08/12 1220 07/08/12 1237  BP: 132/67 146/83  Pulse:  72  Temp: 97.9 F (36.6 C) 98.9 F (37.2 C)  TempSrc: Oral   Resp:  20  Height: 5\' 5"  (1.651 m) 5\' 5"  (1.651 m)  Weight: 110.224 kg (243 lb) 110.224 kg (243 lb)  SpO2: 94% 96%     Physical Exam: Blood pressure 146/83, pulse 72, temperature 98.9 F (37.2 C), temperature source Oral, resp. rate 20, height 5\' 5"  (1.651 m), weight 110.224 kg (243  lb), SpO2 96.00%. Gen: MIld respiratory distress. Head: Normocephalic, atraumatic. Eyes: PERRL, EOMI, sclerae nonicteric. Mouth: Oropharynx clear.  Moist mucous membranes.  Dentures upper palate. Neck: Supple, no thyromegaly, no lymphadenopathy, no jugular venous distention. Chest: Lungs with expiratory wheezes, poor air movement. CV: Heart sounds regular, S1/S2, no murmurs, rubs or gallops. Abdomen: Soft, nontender, nondistended with normal active bowel sounds. Extremities: Extremities with trace edema. Skin: Warm and dry. Neuro: Alert and oriented times 3; cranial nerves II through XII grossly intact. Psych: Mood and affect normal.  Labs on  Admission:  Basic Metabolic Panel:  Recent Labs Lab 07/08/12 1245  NA 138  K 4.2  CL 97  CO2 34*  GLUCOSE 154*  BUN 8  CREATININE 0.41*  CALCIUM 9.2   CBC:  Recent Labs Lab 07/08/12 1245  WBC 4.7  HGB 13.6  HCT 42.0  MCV 89.9  PLT 158    Radiological Exams on Admission: Dg Chest 2 View (if Patient Has Fever And/or Copd)  07/08/2012  *RADIOLOGY REPORT*  Clinical Data: Short of breath and wheezing  CHEST - 2 VIEW  Comparison: 04/13/2012  Findings: Borderline cardiomegaly.  Lungs clear.  Pulmonary vascularity within normal limits.  No pneumothorax.  No pleural effusion.  Intact thoracic spine.  IMPRESSION: No active cardiopulmonary disease.   Original Report Authenticated By: Jolaine Click, M.D.     EKG: Independently reviewed. NSR at 72 beats per minute.  No ST-T wave changes.  Assessment/Plan Principal Problem:   Acute respiratory failure secondary to COPD exacerbation -Admit.  Place on solumedrol, nebulized bronchodilator treatment, Mucinex, Spiriva, and supplemental oxygen. -Check influenza panel and sputum cultures. -Start empiric Levaquin, but hold Tamiflu for now (received a flu shot this season). Active Problems:   Hypertension -Continue Norvasc, Clonidine, Prinvil, Inderal.   Bipolar 1 disorder -Continue Buspar,  Depakote, Sinequan, Remeron, Zoloft.   Diabetes mellitus -Continue Metformin.  Add SSI, moderate scale since glycemic control likely to be affected by steroids.   Tobacco abuse -Counsel.   Hyperlipidemia -Continue Statin.   Hypothyroidism -Continue Synthroid.   Code Status: Full Family Communication: Lysbeth Galas (daughter) [phone]. Disposition Plan: Home when stable.  Time spent: 1 hour.  RAMA,CHRISTINA [facility] Pager [phone]  If 7PM-7AM, please contact night-coverage [email or web address] Password [facility] 07/08/2012, 3:52 PM

## 2012-07-08 NOTE — ED Notes (Signed)
Patient transported to X-ray 

## 2012-07-08 NOTE — ED Notes (Signed)
RT called from tx

## 2012-07-08 NOTE — ED Notes (Signed)
Pt ambulated no o2 pox 87%-90% Greta Doom PA aware

## 2012-07-08 NOTE — ED Provider Notes (Signed)
History     CSN: [id number]  Arrival date & time 07/08/12  1215   First MD Initiated Contact with Patient 07/08/12 1257      Chief Complaint  Patient presents with  . Shortness of Breath    (Consider location/radiation/quality/duration/timing/severity/associated sxs/prior treatment) HPI  57 year old female with history of COPD, hypertension, and bipolar disorder presents complaining of shortness of breath. Patient is a chronic smoker, she was sent from urgent care for further evaluations of her shortness of breath. Patient reports for the past 4 days she has flulike symptoms including running nose, nasal congestion, sneezing, cough. Cough is productive with white clear sputum. She is having increased shortness of breath, and pain to the ribs from her persistent cough. She is using the albuterol inhaler 4-5 times daily without relief. She has also been using over-the-counter medication including DayQuil, TheraFlu which has helped her sxs. She does have prior history of intubations or ICU stays due to worsening COPD. She reports smoking half a pack of cigarettes a day but hasn't been smoking since her symptoms started. She denies fever, chills, exertional chest pain, dyspnea on exertion, increased leg swelling, nausea, vomiting, diarrhea, abdominal pain.  Past Medical History  Diagnosis Date  . Hypertension   . Bipolar 1 disorder   . Insomnia   . Depression   . Suicidal thoughts   . COPD (chronic obstructive pulmonary disease)   . Complication of anesthesia     Hx resp distress during surgery    Past Surgical History  Procedure Laterality Date  . Knee surgery      Left knee tendon surgery  . Finger surgery      middle left finger  . Hernia repair      x 3  . Appendectomy    . Abdominal hysterectomy    . Tubal ligation    . Colon surgery      "colon repair"    Family History  Problem Relation Age of Onset  . Multiple sclerosis Father     History  Substance Use Topics   . Smoking status: Current Every Day Smoker -- 1.00 packs/day for 42 years  . Smokeless tobacco: Never Used  . Alcohol Use: No     Comment: no etoh now, used in the past    OB History   Grav Para Term Preterm Abortions TAB SAB Ect Mult Living                  Review of Systems  Constitutional:       10 Systems reviewed and all are negative for acute change except as noted in the HPI.     Allergies  Morphine and related  Home Medications   Current Outpatient Rx  Name  Route  Sig  Dispense  Refill  . albuterol (PROVENTIL) (5 MG/ML) 0.5% nebulizer solution      0.6ml nebulized every 8 hours scheduled and every 4 hours as needed.   20 mL   3   . amLODipine (NORVASC) 10 MG tablet   Oral   Take 5 mg by mouth daily.         . busPIRone (BUSPAR) 15 MG tablet   Oral   Take 15 mg by mouth 2 (two) times daily.         . cloNIDine (CATAPRES) 0.1 MG tablet   Oral   Take 0.2 mg by mouth at bedtime.         . divalproex (DEPAKOTE ER) 500 MG 24  hr tablet   Oral   Take 1,000 mg by mouth 2 (two) times daily.         Marland Kitchen doxepin (SINEQUAN) 50 MG capsule   Oral   Take 150 mg by mouth at bedtime.         Marland Kitchen glucose monitoring kit (FREESTYLE) monitoring kit   Does not apply   1 each by Does not apply route as needed for other.   1 each   1   . levofloxacin (LEVAQUIN) 750 MG tablet   Oral   Take 1 tablet (750 mg total) by mouth daily. For 4 days   4 tablet   0   . levothyroxine (SYNTHROID, LEVOTHROID) 88 MCG tablet   Oral   Take 88 mcg by mouth daily.         . metFORMIN (GLUCOPHAGE) 850 MG tablet   Oral   Take 850 mg by mouth 2 (two) times daily with a meal.         . nicotine (NICODERM CQ - DOSED IN MG/24 HOURS) 21 mg/24hr patch   Transdermal   Place 1 patch onto the skin daily.   28 patch   2   . primidone (MYSOLINE) 50 MG tablet   Oral   Take 100 mg by mouth at bedtime.          . propranolol (INDERAL) 40 MG tablet   Oral   Take 40 mg by  mouth 2 (two) times daily.         . sertraline (ZOLOFT) 100 MG tablet   Oral   Take 200 mg by mouth daily.         . simvastatin (ZOCOR) 40 MG tablet   Oral   Take 20 mg by mouth at bedtime.         Marland Kitchen tiotropium (SPIRIVA) 18 MCG inhalation capsule   Inhalation   Place 1 capsule (18 mcg total) into inhaler and inhale daily.   30 capsule   3   . zolpidem (AMBIEN) 10 MG tablet   Oral   Take 15 mg by mouth at bedtime.           BP 146/83  Pulse 72  Temp(Src) 98.9 F (37.2 C) (Oral)  Resp 20  Ht 5\' 5"  (1.651 m)  Wt 243 lb (110.224 kg)  BMI 40.44 kg/m2  SpO2 96%  Physical Exam  Nursing note and vitals reviewed. Constitutional: She appears well-developed and well-nourished. No distress.  Awake, alert, nontoxic appearance  HENT:  Head: Atraumatic.  Right Ear: External ear normal.  Left Ear: External ear normal.  Mouth/Throat: Oropharynx is clear and moist. No oropharyngeal exudate.  Eyes: Conjunctivae are normal. Right eye exhibits no discharge. Left eye exhibits no discharge.  Neck: Neck supple. No JVD present.  Cardiovascular: Normal rate and regular rhythm.   Pulmonary/Chest: Effort normal. No respiratory distress (no acute respiratory distress, able to speak in complete sentences. No sensory muscle use.). She has wheezes (Scatter wheezes noted with a decreased lung sound to all field, no obvious rhonchi, or rales heard.). She exhibits no tenderness.  Abdominal: Soft. There is no tenderness. There is no rebound.  Musculoskeletal: She exhibits no edema and no tenderness.  ROM appears intact, no obvious focal weakness  Lymphadenopathy:    She has no cervical adenopathy.  Neurological: She is alert.  Mental status and motor strength appears intact  Skin: No rash noted.  Psychiatric: She has a normal mood and affect.    ED  Course  Procedures (including critical care time)  Labs Reviewed  CBC  BASIC METABOLIC PANEL     Date: 07/08/2012  Rate: 72   Rhythm: normal sinus rhythm  QRS Axis: normal  Intervals: normal  ST/T Wave abnormalities: normal  Conduction Disutrbances:none  Narrative Interpretation:   Old EKG Reviewed: unchanged  Results for orders placed during the hospital encounter of 07/08/12  CBC      Result Value Range   WBC 4.7  4.0 - 10.5 K/uL   RBC 4.67  3.87 - 5.11 MIL/uL   Hemoglobin 13.6  12.0 - 15.0 g/dL   HCT 16.1  09.6 - 04.5 %   MCV 89.9  78.0 - 100.0 fL   MCH 29.1  26.0 - 34.0 pg   MCHC 32.4  30.0 - 36.0 g/dL   RDW 40.9  81.1 - 91.4 %   Platelets 158  150 - 400 K/uL  BASIC METABOLIC PANEL      Result Value Range   Sodium 138  135 - 145 mEq/L   Potassium 4.2  3.5 - 5.1 mEq/L   Chloride 97  96 - 112 mEq/L   CO2 34 (*) 19 - 32 mEq/L   Glucose, Bld 154 (*) 70 - 99 mg/dL   BUN 8  6 - 23 mg/dL   Creatinine, Ser 7.82 (*) 0.50 - 1.10 mg/dL   Calcium 9.2  8.4 - 95.6 mg/dL   GFR calc non Af Amer >90  >90 mL/min   GFR calc Af Amer >90  >90 mL/min   Dg Chest 2 View (if Patient Has Fever And/or Copd)  07/08/2012  *RADIOLOGY REPORT*  Clinical Data: Short of breath and wheezing  CHEST - 2 VIEW  Comparison: 04/13/2012  Findings: Borderline cardiomegaly.  Lungs clear.  Pulmonary vascularity within normal limits.  No pneumothorax.  No pleural effusion.  Intact thoracic spine.  IMPRESSION: No active cardiopulmonary disease.   Original Report Authenticated By: Jolaine Click, M.D.       1:15 PM Patient was seen in by me for her shortness of breath. She is recently getting over a viral infection, however her shortness of breath has worsened. It is likely she is having COPD exacerbation. She is currently feeling better after receiving breathing treatment here in the ER. Her lung with decreased breath sounds on exam. Will continue with breathing treatment. If patient is able to ambulate without having hypoxia, then we'll consider discharge. If she is symptomatic with ambulation despite treatment, then we'll consider admission.  Care discussed with attending.  3:04 PM Although patient felt better after receiving breathing treatment, she is satting at 87% on room and with ambulation.  Considering her prior complication with COPD requiring intubation and ICU stay, plan to have pt admit for further management.  Otherwise pt has normal CBC, BMP (CO2 is high 34), CXR shows no active disease.    3:16 PM I have consulted with [facility], who agrees to admit pt to med surg, team 1.  Pt will be seen in ER.    BP 146/83  Pulse 72  Temp(Src) 98.9 F (37.2 C) (Oral)  Resp 20  Ht 5\' 5"  (1.651 m)  Wt 243 lb (110.224 kg)  BMI 40.44 kg/m2  SpO2 96%  I have reviewed nursing notes and vital signs. I personally reviewed the imaging tests through PACS system  I reviewed available ER/hospitalization records thought the EMR  1. COPD exacerbation  MDM          Fayrene Helper,  PA-C 07/08/12 1526

## 2012-07-09 DIAGNOSIS — J9600 Acute respiratory failure, unspecified whether with hypoxia or hypercapnia: Principal | ICD-10-CM

## 2012-07-09 DIAGNOSIS — E119 Type 2 diabetes mellitus without complications: Secondary | ICD-10-CM

## 2012-07-09 DIAGNOSIS — I10 Essential (primary) hypertension: Secondary | ICD-10-CM

## 2012-07-09 DIAGNOSIS — J441 Chronic obstructive pulmonary disease with (acute) exacerbation: Secondary | ICD-10-CM

## 2012-07-09 LAB — GLUCOSE, CAPILLARY
Glucose-Capillary: 143 mg/dL — ABNORMAL HIGH (ref 70–99)
Glucose-Capillary: 219 mg/dL — ABNORMAL HIGH (ref 70–99)
Glucose-Capillary: 147 mg/dL — ABNORMAL HIGH (ref 70–99)

## 2012-07-09 LAB — BASIC METABOLIC PANEL
BUN: 11 mg/dL (ref 6–23)
CO2: 30 mEq/L (ref 19–32)
Chloride: 97 mEq/L (ref 96–112)
Creatinine, Ser: 0.37 mg/dL — ABNORMAL LOW (ref 0.50–1.10)
Potassium: 4.4 mEq/L (ref 3.5–5.1)

## 2012-07-09 LAB — CBC
HCT: 43.5 % (ref 36.0–46.0)
Hemoglobin: 13.6 g/dL (ref 12.0–15.0)
MCHC: 31.3 g/dL (ref 30.0–36.0)
MCV: 88.6 fL (ref 78.0–100.0)
RDW: 13.7 % (ref 11.5–15.5)

## 2012-07-09 LAB — INFLUENZA PANEL BY PCR (TYPE A & B): Influenza A By PCR: NEGATIVE

## 2012-07-09 MED ORDER — ALPRAZOLAM 0.5 MG PO TABS
0.5000 mg | ORAL_TABLET | Freq: Three times a day (TID) | ORAL | Status: DC | PRN
  Administered 2012-07-09 – 2012-07-11 (×3): 0.5 mg via ORAL
  Filled 2012-07-09 (×3): qty 1

## 2012-07-09 MED ORDER — FLUTICASONE PROPIONATE HFA 110 MCG/ACT IN AERO
1.0000 | INHALATION_SPRAY | Freq: Two times a day (BID) | RESPIRATORY_TRACT | Status: DC
  Administered 2012-07-09 – 2012-07-11 (×4): 1 via RESPIRATORY_TRACT
  Filled 2012-07-09: qty 12

## 2012-07-09 NOTE — Progress Notes (Signed)
Brief Nutrition Note   Pulled to the pt due to Malnutrition Screening Tool score. Pt's chart has been reviewed. Pt currently on a Carb Modified, Medium Diet with a BMI of 40.7, Obesity Class III.  Pt currently weighs 244.5 pounds reports usual weight of 265 pounds. She states that this ~11 pound weight loss has occurred within the last six months. Pt reports that weight loss has been intentional as she would like to loss some weight in order to improve her health and that she has been eating healthier foods and small portions.  No nutrition intervention warranted at this time. If nutrition intervention is needed please consult RD.  Trenton Gammon Dietetic Intern # [phone]

## 2012-07-09 NOTE — Progress Notes (Signed)
New orders noted.

## 2012-07-09 NOTE — Progress Notes (Signed)
[facility] PROGRESS NOTE  [name] [name] WJX:[id number] DOB: Jan 15, 1956 DOA: 07/08/2012  PCP: Kaleen Mask, MD  Brief HPI: [name] [name] is an 57 y.o. female with a PMH of COPD (not currently on home oxygen) and ongoing tobacco abuse who developed flu like symptoms 3 days ago with cough (non-productive), pharyngitis/hoarseness secondary to cough, nasal congestion and chest congestion but no frank fever or myalgias. The patient used Daquil and Severe Cough and Cold Medicine (OTC) with no relief of cough or chest congestion (but nasal congestion improved). She came to the ER for evaluation secondary to worsening dyspnea and wheeze. Upon initial evaluation in the ER, she was found to have acute respiratory failure with wheezing, and was referred to the hospitalist service.  Past medical history:  Past Medical History  Diagnosis Date  . Hypertension   . Bipolar 1 disorder   . Insomnia   . Depression   . Suicidal thoughts   . COPD (chronic obstructive pulmonary disease)   . Complication of anesthesia     Hx resp distress during surgery  . Diabetes mellitus   . Hypothyroidism   . Hyperlipidemia   . Polysubstance abuse     H/O methamphetamine and alcohol abuse, clean x 6 years    Consultants: None  Procedures: None  Antibiotics: IV Levaquin 2/16-->  Subjective: Patient still short of breath. But feeling slightly better. No chest pain.  Objective: Vital Signs  Filed Vitals:   07/08/12 1829 07/08/12 1930 07/08/12 2156 07/09/12 0650  BP: 124/99 168/99 143/82 128/73  Pulse: 82 83 79 63  Temp: 98.6 F (37 C) 98.1 F (36.7 C) 98.2 F (36.8 C) 98.4 F (36.9 C)  TempSrc: Oral Oral Oral Oral  Resp: 18 20 20    Height:  5\' 5"  (1.651 m)    Weight:  110.9 kg (244 lb 7.8 oz)    SpO2: 95% 94% 94% 93%    Intake/Output Summary (Last 24 hours) at 07/09/12 1100 Last data filed at 07/08/12 1832  Gross per 24 hour  Intake    240 ml  Output      0 ml  Net    240 ml    Filed Weights   07/08/12 1220 07/08/12 1237 07/08/12 1930  Weight: 110.224 kg (243 lb) 110.224 kg (243 lb) 110.9 kg (244 lb 7.8 oz)     General appearance: alert, cooperative, appears stated age, no distress and moderately obese Head: Normocephalic, without obvious abnormality, atraumatic Resp: Decreased air entry bilaterally with few wheezes. No crackles Cardio: regular rate and rhythm, S1, S2 normal, no murmur, click, rub or gallop GI: soft, non-tender; bowel sounds normal; no masses,  no organomegaly Extremities: extremities normal, atraumatic, no cyanosis or edema Skin: Skin color, texture, turgor normal. No rashes or lesions Neurologic: Alert and oriented X 3, normal strength and tone. Normal symmetric reflexes. Normal coordination and gait  Lab Results:  Basic Metabolic Panel:  Recent Labs Lab 07/08/12 1245 07/09/12 0506  NA 138 135  K 4.2 4.4  CL 97 97  CO2 34* 30  GLUCOSE 154* 170*  BUN 8 11  CREATININE 0.41* 0.37*  CALCIUM 9.2 9.7   CBC:  Recent Labs Lab 07/08/12 1245 07/09/12 0506  WBC 4.7 5.2  HGB 13.6 13.6  HCT 42.0 43.5  MCV 89.9 88.6  PLT 158 166   BNP (last 3 results)  Recent Labs  04/13/12 1200  PROBNP 218.8*   CBG:  Recent Labs Lab 07/08/12 1750 07/08/12 2154 07/09/12 0810  GLUCAP 139* 195*  143*    No results found for this or any previous visit (from the past 240 hour(s)).    Studies/Results: Dg Chest 2 View (if Patient Has Fever And/or Copd)  07/08/2012  *RADIOLOGY REPORT*  Clinical Data: Short of breath and wheezing  CHEST - 2 VIEW  Comparison: 04/13/2012  Findings: Borderline cardiomegaly.  Lungs clear.  Pulmonary vascularity within normal limits.  No pneumothorax.  No pleural effusion.  Intact thoracic spine.  IMPRESSION: No active cardiopulmonary disease.   Original Report Authenticated By: Jolaine Click, M.D.     Medications:  Scheduled: . amLODipine  10 mg Oral q morning - 10a  . atorvastatin  20 mg Oral QHS  .  busPIRone  15 mg Oral BID  . cloNIDine  0.1 mg Oral QHS  . divalproex  1,000 mg Oral BID  . doxepin  150 mg Oral QHS  . enoxaparin (LOVENOX) injection  60 mg Subcutaneous Q24H  . guaiFENesin  600 mg Oral BID  . insulin aspart  0-15 Units Subcutaneous TID WC  . insulin aspart  0-5 Units Subcutaneous QHS  . levofloxacin (LEVAQUIN) IV  750 mg Intravenous Q24H  . levothyroxine  88 mcg Oral QAC breakfast  . lisinopril  10 mg Oral q morning - 10a  . metFORMIN  850 mg Oral BID WC  . methylPREDNISolone (SOLU-MEDROL) injection  40 mg Intravenous Q8H  . mirtazapine  15 mg Oral QHS  . primidone  150 mg Oral QHS  . propranolol  40 mg Oral BID  . sertraline  100 mg Oral q morning - 10a  . sodium chloride  3 mL Intravenous Q12H  . tiotropium  18 mcg Inhalation Daily  . zolpidem  10 mg Oral QHS   Continuous:  ZOX:WRUEAV chloride, acetaminophen, acetaminophen, albuterol, alum & mag hydroxide-simeth, guaiFENesin-dextromethorphan, ondansetron (ZOFRAN) IV, ondansetron, polyethylene glycol, sodium chloride  Assessment/Plan:  Principal Problem:   Acute respiratory failure Active Problems:   Hypertension   Bipolar 1 disorder   COPD exacerbation   Diabetes mellitus   Tobacco abuse   Hyperlipidemia   Hypothyroidism    Acute respiratory failure secondary to Acute COPD exacerbation  Stable. Continue current treatment. Including solumedrol, nebulized bronchodilator treatment, Mucinex, Spiriva, and supplemental oxygen. Influenza was negative. Was on Flovent at home. Will add. Continue levaquin. Was discharged on home oxygen last year but she returned it because the machine was too large for her bedroom.  Hypertension  Continue Norvasc, Clonidine, Prinvil, Inderal.   Bipolar 1 disorder  Continue Buspar, Depakote, Sinequan, Remeron, Zoloft.   Diabetes mellitus  Continue Metformin. SSI, moderate scale since glycemic control likely to be affected by steroids.   Tobacco abuse  Counsel.    Hyperlipidemia  Continue Statin.   Hypothyroidism  Continue Synthroid.   DVT Prophylaxis: Enoxaparin Code Status: Full  Family Communication: Discussed with patient. No family at bedside. Lysbeth Galas (daughter) [phone].  Disposition Plan: Home when better.     LOS: 1 day   [facility]  [facility] Pager [phone] 07/09/2012, 11:00 AM  If 8PM-8AM, please contact night-coverage at [email or web address], password [facility]

## 2012-07-09 NOTE — Progress Notes (Signed)
Pt states that after she gets the solu-medrol she gets nervious , text page sent to Dr. Rito Ehrlich and informed.

## 2012-07-10 DIAGNOSIS — F319 Bipolar disorder, unspecified: Secondary | ICD-10-CM

## 2012-07-10 LAB — GLUCOSE, CAPILLARY
Glucose-Capillary: 97 mg/dL (ref 70–99)
Glucose-Capillary: 156 mg/dL — ABNORMAL HIGH (ref 70–99)

## 2012-07-10 MED ORDER — ALPRAZOLAM 0.5 MG PO TABS: 0.5000 mg | ORAL_TABLET | Freq: Three times a day (TID) | ORAL | Status: DC | PRN

## 2012-07-10 MED ORDER — METHYLPREDNISOLONE SODIUM SUCC 40 MG IJ SOLR
40.0000 mg | Freq: Two times a day (BID) | INTRAMUSCULAR | Status: DC
  Administered 2012-07-10 – 2012-07-11 (×2): 40 mg via INTRAVENOUS
  Filled 2012-07-10 (×3): qty 1

## 2012-07-10 MED ORDER — ALBUTEROL SULFATE (5 MG/ML) 0.5% IN NEBU
2.5000 mg | INHALATION_SOLUTION | Freq: Four times a day (QID) | RESPIRATORY_TRACT | Status: DC
  Administered 2012-07-10 – 2012-07-11 (×4): 2.5 mg via RESPIRATORY_TRACT
  Filled 2012-07-10 (×4): qty 0.5

## 2012-07-10 MED ORDER — LEVOFLOXACIN 750 MG PO TABS
750.0000 mg | ORAL_TABLET | Freq: Every day | ORAL | Status: DC
  Administered 2012-07-10: 750 mg via ORAL
  Filled 2012-07-10 (×2): qty 1

## 2012-07-10 NOTE — Progress Notes (Signed)
Pt o2 sat on 2liters 95% resting. O2 sat on ra resting 94%.  Ambulating 02 sat  On ra 85-87%.

## 2012-07-10 NOTE — Progress Notes (Signed)
[facility] PROGRESS NOTE  Aleigha Gilani ZOX:[id number] DOB: 1955/11/21 DOA: 07/08/2012  PCP: Kaleen Mask, MD  Brief HPI: [name] [name] is an 57 y.o. female with a PMH of COPD (not currently on home oxygen) and ongoing tobacco abuse who developed flu like symptoms 3 days ago with cough (non-productive), pharyngitis/hoarseness secondary to cough, nasal congestion and chest congestion but no frank fever or myalgias. The patient used Daquil and Severe Cough and Cold Medicine (OTC) with no relief of cough or chest congestion (but nasal congestion improved). She came to the ER for evaluation secondary to worsening dyspnea and wheeze. Upon initial evaluation in the ER, she was found to have acute respiratory failure with wheezing, and was referred to the hospitalist service.  Past medical history:  Past Medical History  Diagnosis Date  . Hypertension   . Bipolar 1 disorder   . Insomnia   . Depression   . Suicidal thoughts   . COPD (chronic obstructive pulmonary disease)   . Complication of anesthesia     Hx resp distress during surgery  . Diabetes mellitus   . Hypothyroidism   . Hyperlipidemia   . Polysubstance abuse     H/O methamphetamine and alcohol abuse, clean x 6 years    Consultants: None  Procedures: None  Antibiotics: IV Levaquin 2/16-->  Subjective: Patient still short of breath but feels better. Not coughing as much. Reports severe anxiety with taking steroids. No chest pain.  Objective: Vital Signs  Filed Vitals:   07/09/12 2225 07/10/12 0525 07/10/12 0845 07/10/12 0930  BP: 130/77 130/79 150/79   Pulse: 71 66 69   Temp: 97.9 F (36.6 C) 98 F (36.7 C) 98.7 F (37.1 C)   TempSrc: Oral Oral Oral   Resp: 22 18 19    Height:      Weight:      SpO2:  94% 95% 88%    Intake/Output Summary (Last 24 hours) at 07/10/12 1035 Last data filed at 07/10/12 0500  Gross per 24 hour  Intake    540 ml  Output      0 ml  Net    540 ml   Filed  Weights   07/08/12 1220 07/08/12 1237 07/08/12 1930  Weight: 110.224 kg (243 lb) 110.224 kg (243 lb) 110.9 kg (244 lb 7.8 oz)     General appearance: alert, cooperative, appears stated age, no distress and moderately obese Head: Normocephalic, without obvious abnormality, atraumatic Resp: Improved air entry today. Few wheezes. No crackles Cardio: regular rate and rhythm, S1, S2 normal, no murmur, click, rub or gallop GI: soft, non-tender; bowel sounds normal; no masses,  no organomegaly Extremities: extremities normal, atraumatic, no cyanosis or edema Skin: Skin color, texture, turgor normal. No rashes or lesions Neurologic: Alert and oriented X 3, normal strength and tone. Normal symmetric reflexes. Normal coordination and gait  Lab Results:  Basic Metabolic Panel:  Recent Labs Lab 07/08/12 1245 07/09/12 0506  NA 138 135  K 4.2 4.4  CL 97 97  CO2 34* 30  GLUCOSE 154* 170*  BUN 8 11  CREATININE 0.41* 0.37*  CALCIUM 9.2 9.7   CBC:  Recent Labs Lab 07/08/12 1245 07/09/12 0506  WBC 4.7 5.2  HGB 13.6 13.6  HCT 42.0 43.5  MCV 89.9 88.6  PLT 158 166   BNP (last 3 results)  Recent Labs  04/13/12 1200  PROBNP 218.8*   CBG:  Recent Labs Lab 07/09/12 0810 07/09/12 1200 07/09/12 1645 07/09/12 2225 07/10/12 0732  GLUCAP 143*  174* 219* 147* 142*    No results found for this or any previous visit (from the past 240 hour(s)).    Studies/Results: Dg Chest 2 View (if Patient Has Fever And/or Copd)  07/08/2012  *RADIOLOGY REPORT*  Clinical Data: Short of breath and wheezing  CHEST - 2 VIEW  Comparison: 04/13/2012  Findings: Borderline cardiomegaly.  Lungs clear.  Pulmonary vascularity within normal limits.  No pneumothorax.  No pleural effusion.  Intact thoracic spine.  IMPRESSION: No active cardiopulmonary disease.   Original Report Authenticated By: Jolaine Click, M.D.     Medications:  Scheduled: . amLODipine  10 mg Oral q morning - 10a  . atorvastatin  20 mg  Oral QHS  . busPIRone  15 mg Oral BID  . cloNIDine  0.1 mg Oral QHS  . divalproex  1,000 mg Oral BID  . doxepin  150 mg Oral QHS  . enoxaparin (LOVENOX) injection  60 mg Subcutaneous Q24H  . fluticasone  1 puff Inhalation BID  . guaiFENesin  600 mg Oral BID  . insulin aspart  0-15 Units Subcutaneous TID WC  . insulin aspart  0-5 Units Subcutaneous QHS  . levofloxacin (LEVAQUIN) IV  750 mg Intravenous Q24H  . levothyroxine  88 mcg Oral QAC breakfast  . lisinopril  10 mg Oral q morning - 10a  . metFORMIN  850 mg Oral BID WC  . methylPREDNISolone (SOLU-MEDROL) injection  40 mg Intravenous Q8H  . mirtazapine  15 mg Oral QHS  . primidone  150 mg Oral QHS  . propranolol  40 mg Oral BID  . sertraline  100 mg Oral q morning - 10a  . sodium chloride  3 mL Intravenous Q12H  . tiotropium  18 mcg Inhalation Daily  . zolpidem  10 mg Oral QHS   Continuous:  XBJ:YNWGNF chloride, acetaminophen, acetaminophen, albuterol, ALPRAZolam, alum & mag hydroxide-simeth, guaiFENesin-dextromethorphan, ondansetron (ZOFRAN) IV, ondansetron, polyethylene glycol, sodium chloride  Assessment/Plan:  Principal Problem:   Acute respiratory failure Active Problems:   Hypertension   Bipolar 1 disorder   COPD exacerbation   Diabetes mellitus   Tobacco abuse   Hyperlipidemia   Hypothyroidism    Acute respiratory failure secondary to Acute COPD exacerbation  Slow to improve. Continue current treatment. Including solumedrol, nebulized bronchodilator treatment, Mucinex, Spiriva, and supplemental oxygen. Decrease dose of solumedrol. Influenza was negative. Flovent was restarted. Continue levaquin but change to oral. Was discharged on home oxygen last year but she returned it because the machine was too large for her bedroom. 88% sats this morning on RA. Will ambulate. Will likely need to go home on O2.  Hypertension  Continue Norvasc, Clonidine, Prinvil, Inderal.   Bipolar 1 disorder  Continue Buspar, Depakote,  Sinequan, Remeron, Zoloft. Prescribe few tablets of xanax to take with steroids.  Diabetes mellitus  Continue Metformin. SSI, moderate scale since glycemic control likely to be affected by steroids.   Tobacco abuse  Counsel.   Hyperlipidemia  Continue Statin.   Hypothyroidism  Continue Synthroid.   DVT Prophylaxis: Enoxaparin Code Status: Full  Family Communication: Discussed with patient. No family at bedside. Lysbeth Galas (daughter) [phone].  Disposition Plan: Home when better. Anticipate discharge 2/19 if better.    LOS: 2 days   [facility]  [facility] Pager [phone] 07/10/2012, 10:35 AM  If 8PM-8AM, please contact night-coverage at [email or web address], password [facility]

## 2012-07-11 DIAGNOSIS — J9620 Acute and chronic respiratory failure, unspecified whether with hypoxia or hypercapnia: Secondary | ICD-10-CM

## 2012-07-11 LAB — GLUCOSE, CAPILLARY
Glucose-Capillary: 123 mg/dL — ABNORMAL HIGH (ref 70–99)
Glucose-Capillary: 260 mg/dL — ABNORMAL HIGH (ref 70–99)

## 2012-07-11 LAB — BASIC METABOLIC PANEL
GFR calc non Af Amer: >90 mL/min (ref >90)
Glucose, Bld: 168 mg/dL — ABNORMAL HIGH (ref 70–99)
Potassium: 4.1 mEq/L (ref 3.5–5.1)
Sodium: 137 mEq/L (ref 135–145)

## 2012-07-11 MED ORDER — GUAIFENESIN ER 600 MG PO TB12: 600.0000 mg | ORAL_TABLET | Freq: Two times a day (BID) | ORAL | Status: DC

## 2012-07-11 MED ORDER — PREDNISONE (PAK) 10 MG PO TABS: 10.0000 mg | ORAL_TABLET | Freq: Every day | ORAL | Status: DC

## 2012-07-11 MED ORDER — GUAIFENESIN-DM 100-10 MG/5ML PO SYRP: 5.0000 mL | ORAL_SOLUTION | ORAL | Status: DC | PRN

## 2012-07-11 MED ORDER — LEVOFLOXACIN 750 MG PO TABS: 750.0000 mg | ORAL_TABLET | Freq: Every day | ORAL | Status: DC

## 2012-07-11 MED ORDER — FLUTICASONE PROPIONATE HFA 110 MCG/ACT IN AERO: 1.0000 | INHALATION_SPRAY | Freq: Two times a day (BID) | RESPIRATORY_TRACT | Status: DC

## 2012-07-11 NOTE — Progress Notes (Signed)
07/11/2012 1100 Pt 02 sat on RA 87% resting.

## 2012-07-11 NOTE — Progress Notes (Signed)
Pt discharged home via private vehicle.  Discharge instructions and prescriptions given.  Pt verbalized understanding.  IV d/c by nursing student with catheter intact.  Home 02 delivered by [facility] and sent home with patient wearing.

## 2012-07-11 NOTE — Discharge Summary (Signed)
Physician Discharge Summary  [name] [name] AVW:[id number] DOB: 09-27-1955 DOA: 07/08/2012  PCP: Kaleen Mask, MD  Admit date: 07/08/2012 Discharge date: 07/11/2012  Time spent: 35 minutes  Recommendations for Outpatient Follow-up:  1. Home O2 2. Stop smoking  Discharge Diagnoses:  Principal Problem:   Acute respiratory failure Active Problems:   Hypertension   Bipolar 1 disorder   COPD exacerbation   Diabetes mellitus   Tobacco abuse   Hyperlipidemia   Hypothyroidism   Discharge Condition: improved  Diet recommendation: cardiac/diabetic  Filed Weights   07/08/12 1220 07/08/12 1237 07/08/12 1930  Weight: 110.224 kg (243 lb) 110.224 kg (243 lb) 110.9 kg (244 lb 7.8 oz)    History of present illness:  [name] [name] is an 57 y.o. female with a PMH of COPD (not currently on home oxygen) and ongoing tobacco abuse who developed flu like symptoms 3 days ago with cough (non-productive), pharyngitis/hoarseness secondary to cough, nasal congestion and chest congestion but no frank fever or myalgias. The patient used Daquil and Severe Cough and Cold Medicine (OTC) with no relief of cough or chest congestion (but nasal congestion improved). She came to the ER for evaluation secondary to worsening dyspnea and wheeze. Upon initial evaluation in the ER, she was found to have acute respiratory failure with wheezing, and was referred to the hospitalist service   Hospital Course:  Acute respiratory failure secondary to Acute COPD exacerbation  Change to PO steroids, nebulized bronchodilator treatment, Mucinex, Spiriva, and supplemental oxygen.  Influenza was negative. Flovent was restarted. Continue levaquin.  Was discharged on home oxygen last year but she returned it because the machine was too large for her bedroom. 88% sats this morning on RA. Will ambulate. Will likely need to go home on O2.   Hypertension  Continue Norvasc, Clonidine, Prinvil, Inderal.   Bipolar 1  disorder  Continue Buspar, Depakote, Sinequan, Remeron, Zoloft. Prescribe few tablets of xanax to take with steroids.   Diabetes mellitus  Continue Metformin. SSI, moderate scale since glycemic control likely to be affected by steroids.   Tobacco abuse  Counsel.   Hyperlipidemia  Continue Statin.   Hypothyroidism  Continue Synthroid.    Procedures:  none  Consultations:  none  Discharge Exam: Filed Vitals:   07/10/12 2228 07/11/12 0630 07/11/12 0835 07/11/12 0900  BP: 119/55 158/87  126/78  Pulse:  77  78  Temp:  98.1 F (36.7 C)  97.5 F (36.4 C)  TempSrc:  Oral  Oral  Resp:  18  18  Height:      Weight:      SpO2:  94% 94% 91%    General: A+Ox3, NAD Cardiovascular: rrr Respiratory: mild wheezing, dec BS B/L  Discharge Instructions  Discharge Orders   Future Orders Complete By Expires     Diet - low sodium heart healthy  As directed     Diet Carb Modified  As directed     Discharge instructions  As directed     Comments:      O2 24/7    Increase activity slowly  As directed         Medication List    TAKE these medications       albuterol (5 MG/ML) 0.5% nebulizer solution  Commonly known as:  PROVENTIL  Take 2.5 mg by nebulization every 6 (six) hours as needed for wheezing or shortness of breath.     ALPRAZolam 0.5 MG tablet  Commonly known as:  XANAX  Take 1 tablet (0.5 mg  total) by mouth 3 (three) times daily as needed for anxiety.     amLODipine 10 MG tablet  Commonly known as:  NORVASC  Take 10 mg by mouth every morning.     busPIRone 15 MG tablet  Commonly known as:  BUSPAR  Take 15 mg by mouth 2 (two) times daily.     cloNIDine 0.1 MG tablet  Commonly known as:  CATAPRES  Take 0.1 mg by mouth at bedtime.     divalproex 500 MG 24 hr tablet  Commonly known as:  DEPAKOTE ER  Take 1,000 mg by mouth 2 (two) times daily.     doxepin 50 MG capsule  Commonly known as:  SINEQUAN  Take 150 mg by mouth at bedtime.     fluticasone 110  MCG/ACT inhaler  Commonly known as:  FLOVENT HFA  Inhale 1 puff into the lungs 2 (two) times daily.     glucose monitoring kit monitoring kit  1 each by Does not apply route as needed for other.     guaiFENesin 600 MG 12 hr tablet  Commonly known as:  MUCINEX  Take 1 tablet (600 mg total) by mouth 2 (two) times daily.     guaiFENesin-dextromethorphan 100-10 MG/5ML syrup  Commonly known as:  ROBITUSSIN DM  Take 5 mLs by mouth every 4 (four) hours as needed for cough.     levofloxacin 750 MG tablet  Commonly known as:  LEVAQUIN  Take 1 tablet (750 mg total) by mouth at bedtime.     levothyroxine 88 MCG tablet  Commonly known as:  SYNTHROID, LEVOTHROID  Take 88 mcg by mouth daily before breakfast.     lisinopril 10 MG tablet  Commonly known as:  PRINIVIL,ZESTRIL  Take 10 mg by mouth every morning.     metFORMIN 850 MG tablet  Commonly known as:  GLUCOPHAGE  Take 850 mg by mouth 2 (two) times daily with a meal.     mirtazapine 15 MG tablet  Commonly known as:  REMERON  Take 15 mg by mouth at bedtime.     predniSONE 10 MG tablet  Commonly known as:  STERAPRED UNI-PAK  Take 1 tablet (10 mg total) by mouth daily. 40 mg x 3 days, 30 mg x 3 days, 20 mg x 3 days, 10 mg x 3 days     primidone 50 MG tablet  Commonly known as:  MYSOLINE  Take 150 mg by mouth at bedtime.     propranolol 40 MG tablet  Commonly known as:  INDERAL  Take 40 mg by mouth 2 (two) times daily.     sertraline 100 MG tablet  Commonly known as:  ZOLOFT  Take 100 mg by mouth every morning.     simvastatin 40 MG tablet  Commonly known as:  ZOCOR  Take 40 mg by mouth at bedtime.     tiotropium 18 MCG inhalation capsule  Commonly known as:  SPIRIVA  Place 1 capsule (18 mcg total) into inhaler and inhale daily.     zolpidem 10 MG tablet  Commonly known as:  AMBIEN  Take 15 mg by mouth at bedtime.          The results of significant diagnostics from this hospitalization (including imaging,  microbiology, ancillary and laboratory) are listed below for reference.    Significant Diagnostic Studies: Dg Chest 2 View (if Patient Has Fever And/or Copd)  07/08/2012  *RADIOLOGY REPORT*  Clinical Data: Short of breath and wheezing  CHEST - 2  VIEW  Comparison: 04/13/2012  Findings: Borderline cardiomegaly.  Lungs clear.  Pulmonary vascularity within normal limits.  No pneumothorax.  No pleural effusion.  Intact thoracic spine.  IMPRESSION: No active cardiopulmonary disease.   Original Report Authenticated By: Jolaine Click, M.D.     Microbiology: No results found for this or any previous visit (from the past 240 hour(s)).   Labs: Basic Metabolic Panel:  Recent Labs Lab 07/08/12 1245 07/09/12 0506 07/11/12 0521  NA 138 135 137  K 4.2 4.4 4.1  CL 97 97 94*  CO2 34* 30 31  GLUCOSE 154* 170* 168*  BUN 8 11 23   CREATININE 0.41* 0.37* 0.42*  CALCIUM 9.2 9.7 9.4   Liver Function Tests: No results found for this basename: AST, ALT, ALKPHOS, BILITOT, PROT, ALBUMIN,  in the last 168 hours No results found for this basename: LIPASE, AMYLASE,  in the last 168 hours No results found for this basename: AMMONIA,  in the last 168 hours CBC:  Recent Labs Lab 07/08/12 1245 07/09/12 0506  WBC 4.7 5.2  HGB 13.6 13.6  HCT 42.0 43.5  MCV 89.9 88.6  PLT 158 166   Cardiac Enzymes: No results found for this basename: CKTOTAL, CKMB, CKMBINDEX, TROPONINI,  in the last 168 hours BNP: BNP (last 3 results)  Recent Labs  04/13/12 1200  PROBNP 218.8*   CBG:  Recent Labs Lab 07/10/12 1436 07/10/12 1652 07/10/12 2113 07/11/12 0740 07/11/12 1101  GLUCAP 97 156* 123* 146* 260*       Signed:  Ysabelle Goodroe  [facility] 07/11/2012, 11:30 AM

## 2012-08-16 ENCOUNTER — Encounter (HOSPITAL_COMMUNITY): Payer: Self-pay | Admitting: Emergency Medicine

## 2012-08-16 ENCOUNTER — Emergency Department (HOSPITAL_COMMUNITY)
Admission: EM | Admit: 2012-08-16 | Discharge: 2012-08-16 | Disposition: A | Discharge status: Home / Self Care | Payer: Medicaid Other | Attending: Emergency Medicine | Admitting: Emergency Medicine

## 2012-08-16 ENCOUNTER — Emergency Department (HOSPITAL_COMMUNITY): Payer: Medicaid Other

## 2012-08-16 DIAGNOSIS — E119 Type 2 diabetes mellitus without complications: Secondary | ICD-10-CM | POA: Insufficient documentation

## 2012-08-16 DIAGNOSIS — IMO0001 Reserved for inherently not codable concepts without codable children: Secondary | ICD-10-CM

## 2012-08-16 DIAGNOSIS — F319 Bipolar disorder, unspecified: Secondary | ICD-10-CM | POA: Insufficient documentation

## 2012-08-16 DIAGNOSIS — J449 Chronic obstructive pulmonary disease, unspecified: Secondary | ICD-10-CM | POA: Insufficient documentation

## 2012-08-16 DIAGNOSIS — J4489 Other specified chronic obstructive pulmonary disease: Secondary | ICD-10-CM | POA: Insufficient documentation

## 2012-08-16 DIAGNOSIS — I10 Essential (primary) hypertension: Secondary | ICD-10-CM | POA: Insufficient documentation

## 2012-08-16 DIAGNOSIS — F1910 Other psychoactive substance abuse, uncomplicated: Secondary | ICD-10-CM | POA: Insufficient documentation

## 2012-08-16 DIAGNOSIS — F17200 Nicotine dependence, unspecified, uncomplicated: Secondary | ICD-10-CM | POA: Insufficient documentation

## 2012-08-16 DIAGNOSIS — R4182 Altered mental status, unspecified: Secondary | ICD-10-CM | POA: Insufficient documentation

## 2012-08-16 DIAGNOSIS — Z79899 Other long term (current) drug therapy: Secondary | ICD-10-CM | POA: Insufficient documentation

## 2012-08-16 DIAGNOSIS — R42 Dizziness and giddiness: Secondary | ICD-10-CM | POA: Insufficient documentation

## 2012-08-16 DIAGNOSIS — E039 Hypothyroidism, unspecified: Secondary | ICD-10-CM | POA: Insufficient documentation

## 2012-08-16 DIAGNOSIS — E785 Hyperlipidemia, unspecified: Secondary | ICD-10-CM | POA: Insufficient documentation

## 2012-08-16 LAB — BASIC METABOLIC PANEL
BUN: 12 mg/dL (ref 6–23)
CO2: 35 mEq/L — ABNORMAL HIGH (ref 19–32)
Calcium: 9.1 mg/dL (ref 8.4–10.5)
Creatinine, Ser: 0.39 mg/dL — ABNORMAL LOW (ref 0.50–1.10)
GFR calc non Af Amer: >90 mL/min (ref >90)
Glucose, Bld: 104 mg/dL — ABNORMAL HIGH (ref 70–99)
Sodium: 139 mEq/L (ref 135–145)

## 2012-08-16 LAB — CBC
HCT: 41.1 % (ref 36.0–46.0)
Hemoglobin: 13.1 g/dL (ref 12.0–15.0)
MCH: 29.5 pg (ref 26.0–34.0)
MCHC: 31.9 g/dL (ref 30.0–36.0)
MCV: 92.6 fL (ref 78.0–100.0)
RBC: 4.44 MIL/uL (ref 3.87–5.11)

## 2012-08-16 MED ORDER — PREDNISONE 20 MG PO TABS
60.0000 mg | ORAL_TABLET | Freq: Once | ORAL | Status: AC
  Administered 2012-08-16: 60 mg via ORAL
  Filled 2012-08-16: qty 3

## 2012-08-16 MED ORDER — PREDNISONE 20 MG PO TABS: ORAL_TABLET | ORAL | Status: DC

## 2012-08-16 MED ORDER — LORAZEPAM 0.5 MG PO TABS: 0.5000 mg | ORAL_TABLET | Freq: Three times a day (TID) | ORAL | Status: DC | PRN

## 2012-08-16 MED ORDER — ALBUTEROL SULFATE (5 MG/ML) 0.5% IN NEBU
5.0000 mg | INHALATION_SOLUTION | Freq: Once | RESPIRATORY_TRACT | Status: AC
  Administered 2012-08-16: 5 mg via RESPIRATORY_TRACT
  Filled 2012-08-16: qty 1
  Filled 2012-08-16 (×2): qty 0.5

## 2012-08-16 NOTE — ED Notes (Signed)
Patient transported to X-ray 

## 2012-08-16 NOTE — ED Provider Notes (Signed)
History     CSN: [id number]  Arrival date & time 08/16/12  1711   First MD Initiated Contact with Patient 08/16/12 1723      Chief Complaint  Patient presents with  . headache   . Dizziness  . Altered Mental Status    (Consider location/radiation/quality/duration/timing/severity/associated sxs/prior treatment) HPI  Patient has history of COPD on oxygen at home. She continues to smoke one pack a day. She relates she has a history of headaches and 5 days ago she started waking up daily with her usual migraine headache. She states she has intense throbbing that is located frontally and sometimes it radiates from the back and comes out her right eye. She has nausea without vomiting. She denies any blurred vision. She states she is having ringing in her ears. She also reports increasing shortness of breath and cough with clear mucus production. She states her chest feels heavy. She has a nebulizer at home and states it helps briefly. She is using it 4 times a day. She denies any fever. She also states she's been having swelling in her legs and denies history of DVT or congestive heart failure.  Patient states she's had tremors for years since she stopped doing methamphetamine and alcohol. She was advised to discuss this further with her physician.  She and her daughter states she's having confusion however when I asked them about things that would indicate confusion she does not have them. Daughter states she's not saying anything that's inappropriate or getting lost in the house or not recognizing anybody. Daughter just states "her eyes look different".  Daughter is expecting her first baby in May. The daughter reports her mother is getting upset and anxious worrying about the birth of the baby. Daughter states blankly "she needs to quit worrying about stuff like that"   PCP Dr Jeannetta Nap  Past Medical History  Diagnosis Date  . Hypertension   . Bipolar 1 disorder   . Insomnia   . Depression    . Suicidal thoughts   . COPD (chronic obstructive pulmonary disease)   . Complication of anesthesia     Hx resp distress during surgery  . Diabetes mellitus   . Hypothyroidism   . Hyperlipidemia   . Polysubstance abuse     H/O methamphetamine and alcohol abuse, clean x 6 years    Past Surgical History  Procedure Laterality Date  . Knee surgery      Left knee tendon surgery  . Finger surgery      middle left finger  . Hernia repair      x 3  . Appendectomy    . Abdominal hysterectomy    . Tubal ligation    . Colon surgery      "colon repair"    Family History  Problem Relation Age of Onset  . Multiple sclerosis Mother   . Alcoholism Father   . Depression Brother     History  Substance Use Topics  . Smoking status: Current Every Day Smoker -- 1.00 packs/day for 42 years  . Smokeless tobacco: Never Used  . Alcohol Use: No     Comment: H/O alcoholism, clean x 6 years.  Lives at home Lives with spoudaughter On home oxygen 2 lpm [place]  OB History   Grav Para Term Preterm Abortions TAB SAB Ect Mult Living                  Review of Systems  All other systems reviewed and are  negative.    Allergies  Morphine and related and Potassium-containing compounds  Home Medications   Current Outpatient Rx  Name  Route  Sig  Dispense  Refill  . albuterol (PROVENTIL) (5 MG/ML) 0.5% nebulizer solution   Nebulization   Take 2.5 mg by nebulization every 6 (six) hours as needed for wheezing or shortness of breath.         . ALPRAZolam (XANAX) 0.5 MG tablet   Oral   Take 1 tablet (0.5 mg total) by mouth 3 (three) times daily as needed for anxiety.   15 tablet   0   . amLODipine (NORVASC) 10 MG tablet   Oral   Take 10 mg by mouth every morning.          . busPIRone (BUSPAR) 15 MG tablet   Oral   Take 15 mg by mouth 2 (two) times daily.         . cloNIDine (CATAPRES) 0.1 MG tablet   Oral   Take 0.1 mg by mouth at bedtime.          . divalproex (DEPAKOTE  ER) 500 MG 24 hr tablet   Oral   Take 1,000 mg by mouth 2 (two) times daily.         Marland Kitchen doxepin (SINEQUAN) 50 MG capsule   Oral   Take 150 mg by mouth at bedtime.         . fluticasone (FLOVENT HFA) 110 MCG/ACT inhaler   Inhalation   Inhale 1 puff into the lungs 2 (two) times daily.   1 Inhaler   1   . levothyroxine (SYNTHROID, LEVOTHROID) 88 MCG tablet   Oral   Take 88 mcg by mouth daily before breakfast.          . lisinopril (PRINIVIL,ZESTRIL) 10 MG tablet   Oral   Take 10 mg by mouth every morning.         . metFORMIN (GLUCOPHAGE) 850 MG tablet   Oral   Take 850 mg by mouth 2 (two) times daily with a meal.         . mirtazapine (REMERON) 15 MG tablet   Oral   Take 15 mg by mouth at bedtime.         . primidone (MYSOLINE) 50 MG tablet   Oral   Take 150 mg by mouth at bedtime.          . propranolol (INDERAL) 40 MG tablet   Oral   Take 40 mg by mouth 2 (two) times daily.         . sertraline (ZOLOFT) 100 MG tablet   Oral   Take 100 mg by mouth every morning.          . simvastatin (ZOCOR) 40 MG tablet   Oral   Take 40 mg by mouth at bedtime.          Marland Kitchen tiotropium (SPIRIVA) 18 MCG inhalation capsule   Inhalation   Place 1 capsule (18 mcg total) into inhaler and inhale daily.   30 capsule   3   . zolpidem (AMBIEN) 10 MG tablet   Oral   Take 15 mg by mouth at bedtime.           BP 159/75  Pulse 83  Temp(Src) 98.6 F (37 C) (Oral)  Resp 22  SpO2 86%  Vital signs normal    Physical Exam  Nursing note and vitals reviewed. Constitutional: She is oriented to person, place, and time. She  appears well-developed and well-nourished.  Non-toxic appearance. She does not appear ill. No distress.  HENT:  Head: Normocephalic and atraumatic.  Right Ear: External ear normal.  Left Ear: External ear normal.  Nose: Nose normal. No mucosal edema or rhinorrhea.  Mouth/Throat: Oropharynx is clear and moist and mucous membranes are normal. No  dental abscesses or edematous.  Eyes: Conjunctivae and EOM are normal. Pupils are equal, round, and reactive to light.  Neck: Normal range of motion and full passive range of motion without pain. Neck supple.  Cardiovascular: Normal rate, regular rhythm and normal heart sounds.  Exam reveals no gallop and no friction rub.   No murmur heard. Pulmonary/Chest: Effort normal and breath sounds normal. No respiratory distress. She has no wheezes. She has no rhonchi. She has no rales. She exhibits no tenderness and no crepitus.  Pt has some expiratory wheezing, some retractions  Abdominal: Soft. Normal appearance and bowel sounds are normal. She exhibits no distension. There is no tenderness. There is no rebound and no guarding.  Musculoskeletal: Normal range of motion. She exhibits no edema and no tenderness.  Moves all extremities well.   Neurological: She is alert and oriented to person, place, and time. She has normal strength. No cranial nerve deficit.  Skin: Skin is warm, dry and intact. No rash noted. No erythema. No pallor.  Mildly flushed face  Psychiatric: She has a normal mood and affect. Her speech is normal and behavior is normal. Her mood appears not anxious.    ED Course  Procedures (including critical care time)  Medications  albuterol (PROVENTIL) (5 MG/ML) 0.5% nebulizer solution 5 mg (5 mg Nebulization Given 08/16/12 1808)  predniSONE (DELTASONE) tablet 60 mg (60 mg Oral Given 08/16/12 1836)    Daughter and patient states when patient is on prednisone she needs to be on anxiety medication specifically ativan  Recheck, pt states she feels much better, ready to go home.   Results for orders placed during the hospital encounter of 08/16/12  CBC      Result Value Range   WBC 6.7  4.0 - 10.5 K/uL   RBC 4.44  3.87 - 5.11 MIL/uL   Hemoglobin 13.1  12.0 - 15.0 g/dL   HCT 16.1  09.6 - 04.5 %   MCV 92.6  78.0 - 100.0 fL   MCH 29.5  26.0 - 34.0 pg   MCHC 31.9  30.0 - 36.0 g/dL    RDW 40.9  81.1 - 91.4 %   Platelets 122 (*) 150 - 400 K/uL  BASIC METABOLIC PANEL      Result Value Range   Sodium 139  135 - 145 mEq/L   Potassium 4.2  3.5 - 5.1 mEq/L   Chloride 97  96 - 112 mEq/L   CO2 35 (*) 19 - 32 mEq/L   Glucose, Bld 104 (*) 70 - 99 mg/dL   BUN 12  6 - 23 mg/dL   Creatinine, Ser 7.82 (*) 0.50 - 1.10 mg/dL   Calcium 9.1  8.4 - 95.6 mg/dL   GFR calc non Af Amer >90  >90 mL/min   GFR calc Af Amer >90  >90 mL/min  PRO B NATRIURETIC PEPTIDE      Result Value Range   Pro B Natriuretic peptide (BNP) 311.0 (*) 0 - 125 pg/mL  POCT I-STAT TROPONIN I      Result Value Range   Troponin i, poc 0.02  0.00 - 0.08 ng/mL   Comment 3  Laboratory interpretation all normal except mildly elevated BNP   Dg Chest 2 View (if Patient Has Fever And/or Copd)  08/16/2012  *RADIOLOGY REPORT*  Clinical Data: Headaches, dizziness, confusion.  CHEST - 2 VIEW  Comparison: 07/08/2012  Findings: Mild cardiomegaly stable.  Relatively low lung volumes with resultant crowding of perihilar and bibasilar bronchovascular markings.  No confluent airspace infiltrate or overt edema.  No effusion.  Regional bones unremarkable.  IMPRESSION:  1.  Stable mild cardiomegaly.   Original Report Authenticated By: D. Andria Rhein, MD      Date: 08/16/2012  Rate: 84  Rhythm: normal sinus rhythm  QRS Axis: normal  Intervals: normal  ST/T Wave abnormalities: normal  Conduction Disutrbances:none  Narrative Interpretation:   Old EKG Reviewed: unchanged from 07/08/2012      1. COPD with bronchitis     New Prescriptions   LORAZEPAM (ATIVAN) 0.5 MG TABLET    Take 1 tablet (0.5 mg total) by mouth every 8 (eight) hours as needed for anxiety.   PREDNISONE (DELTASONE) 20 MG TABLET    Take 3 po QD x 3d , then 2 po QD x 3d then 1 po QD x 3d    Plan discharge  Devoria Albe, MD, FACEP   MDM          Ward Givens, MD 08/16/12 [phone]

## 2012-08-16 NOTE — ED Notes (Signed)
Attempted IV acces times 3. Unsuccessful.

## 2012-08-16 NOTE — ED Notes (Signed)
Pt been having headaches, dizziness, ringing in ears, confusion for 5 days. They thought it was related to her being out of her BP and thyroid meds but started back on those on Tuesday but still having the same symptoms.  Pt has PMH COPD and is on 2L O2 all the time.   Pt went to urgent care and was told to come to ED. Pt also c/o swelling in bilat legs and feet.

## 2012-08-17 ENCOUNTER — Telehealth (HOSPITAL_COMMUNITY): Payer: Self-pay | Admitting: Emergency Medicine

## 2012-08-29 ENCOUNTER — Emergency Department (HOSPITAL_COMMUNITY): Payer: Medicaid Other

## 2012-08-29 ENCOUNTER — Inpatient Hospital Stay (HOSPITAL_COMMUNITY)
Admission: EM | Admit: 2012-08-29 | Discharge: 2012-08-30 | DRG: 092 | Disposition: A | Discharge status: Home / Self Care | Payer: Medicaid Other | Attending: Internal Medicine | Admitting: Internal Medicine

## 2012-08-29 ENCOUNTER — Encounter (HOSPITAL_COMMUNITY): Payer: Self-pay

## 2012-08-29 DIAGNOSIS — F1010 Alcohol abuse, uncomplicated: Secondary | ICD-10-CM | POA: Diagnosis present

## 2012-08-29 DIAGNOSIS — R51 Headache: Secondary | ICD-10-CM | POA: Diagnosis present

## 2012-08-29 DIAGNOSIS — R279 Unspecified lack of coordination: Secondary | ICD-10-CM | POA: Diagnosis present

## 2012-08-29 DIAGNOSIS — W19XXXA Unspecified fall, initial encounter: Secondary | ICD-10-CM | POA: Diagnosis present

## 2012-08-29 DIAGNOSIS — F17200 Nicotine dependence, unspecified, uncomplicated: Secondary | ICD-10-CM | POA: Diagnosis present

## 2012-08-29 DIAGNOSIS — R251 Tremor, unspecified: Secondary | ICD-10-CM | POA: Diagnosis present

## 2012-08-29 DIAGNOSIS — R45851 Suicidal ideations: Secondary | ICD-10-CM

## 2012-08-29 DIAGNOSIS — R2681 Unsteadiness on feet: Secondary | ICD-10-CM | POA: Diagnosis present

## 2012-08-29 DIAGNOSIS — J441 Chronic obstructive pulmonary disease with (acute) exacerbation: Secondary | ICD-10-CM

## 2012-08-29 DIAGNOSIS — T43205A Adverse effect of unspecified antidepressants, initial encounter: Secondary | ICD-10-CM | POA: Diagnosis not present

## 2012-08-29 DIAGNOSIS — E785 Hyperlipidemia, unspecified: Secondary | ICD-10-CM

## 2012-08-29 DIAGNOSIS — E039 Hypothyroidism, unspecified: Secondary | ICD-10-CM

## 2012-08-29 DIAGNOSIS — F3289 Other specified depressive episodes: Secondary | ICD-10-CM

## 2012-08-29 DIAGNOSIS — R270 Ataxia, unspecified: Secondary | ICD-10-CM

## 2012-08-29 DIAGNOSIS — E119 Type 2 diabetes mellitus without complications: Secondary | ICD-10-CM

## 2012-08-29 DIAGNOSIS — Y92009 Unspecified place in unspecified non-institutional (private) residence as the place of occurrence of the external cause: Secondary | ICD-10-CM

## 2012-08-29 DIAGNOSIS — F319 Bipolar disorder, unspecified: Secondary | ICD-10-CM | POA: Diagnosis present

## 2012-08-29 DIAGNOSIS — Z794 Long term (current) use of insulin: Secondary | ICD-10-CM

## 2012-08-29 DIAGNOSIS — R0600 Dyspnea, unspecified: Secondary | ICD-10-CM

## 2012-08-29 DIAGNOSIS — R269 Unspecified abnormalities of gait and mobility: Principal | ICD-10-CM

## 2012-08-29 DIAGNOSIS — R259 Unspecified abnormal involuntary movements: Secondary | ICD-10-CM

## 2012-08-29 DIAGNOSIS — F329 Major depressive disorder, single episode, unspecified: Secondary | ICD-10-CM | POA: Diagnosis present

## 2012-08-29 DIAGNOSIS — Z9981 Dependence on supplemental oxygen: Secondary | ICD-10-CM

## 2012-08-29 DIAGNOSIS — I10 Essential (primary) hypertension: Secondary | ICD-10-CM

## 2012-08-29 DIAGNOSIS — J9620 Acute and chronic respiratory failure, unspecified whether with hypoxia or hypercapnia: Secondary | ICD-10-CM

## 2012-08-29 HISTORY — DX: Noninfective gastroenteritis and colitis, unspecified: K52.9

## 2012-08-29 HISTORY — DX: Shortness of breath: R06.02

## 2012-08-29 HISTORY — DX: Headache: R51

## 2012-08-29 HISTORY — DX: Tobacco use: Z72.0

## 2012-08-29 LAB — URINALYSIS, ROUTINE W REFLEX MICROSCOPIC
Bilirubin Urine: NEGATIVE
Nitrite: NEGATIVE
Specific Gravity, Urine: 1.025 (ref 1.005–1.030)
Urobilinogen, UA: 0.2 mg/dL (ref 0.0–1.0)

## 2012-08-29 LAB — RAPID URINE DRUG SCREEN, HOSP PERFORMED
Amphetamines: NOT DETECTED
Benzodiazepines: NOT DETECTED
Cocaine: NOT DETECTED

## 2012-08-29 LAB — HIV ANTIBODY (ROUTINE TESTING W REFLEX): HIV: NONREACTIVE

## 2012-08-29 LAB — BASIC METABOLIC PANEL
Calcium: 9.7 mg/dL (ref 8.4–10.5)
GFR calc non Af Amer: >90 mL/min (ref >90)
Sodium: 141 mEq/L (ref 135–145)

## 2012-08-29 LAB — CBC
Hemoglobin: 14.3 g/dL (ref 12.0–15.0)
MCH: 28.8 pg (ref 26.0–34.0)
MCHC: 31.9 g/dL (ref 30.0–36.0)
MCV: 90.3 fL (ref 78.0–100.0)
Platelets: 154 10*3/uL (ref 150–400)
RBC: 5.02 MIL/uL (ref 3.87–5.11)
RBC: 4.96 MIL/uL (ref 3.87–5.11)
WBC: 10 10*3/uL (ref 4.0–10.5)

## 2012-08-29 LAB — TROPONIN I: Troponin I: <0.3 ng/mL (ref <0.30)

## 2012-08-29 LAB — PRO B NATRIURETIC PEPTIDE: Pro B Natriuretic peptide (BNP): 45.9 pg/mL (ref 0–125)

## 2012-08-29 LAB — HEPATIC FUNCTION PANEL
Alkaline Phosphatase: 89 U/L (ref 39–117)
Total Protein: 7.1 g/dL (ref 6.0–8.3)

## 2012-08-29 LAB — GLUCOSE, CAPILLARY
Glucose-Capillary: 184 mg/dL — ABNORMAL HIGH (ref 70–99)
Glucose-Capillary: 221 mg/dL — ABNORMAL HIGH (ref 70–99)

## 2012-08-29 LAB — CK: Total CK: 20 U/L (ref 7–177)

## 2012-08-29 LAB — URINE MICROSCOPIC-ADD ON

## 2012-08-29 LAB — TSH: TSH: 0.455 u[IU]/mL (ref 0.350–4.500)

## 2012-08-29 LAB — POCT I-STAT TROPONIN I: Troponin i, poc: 0 ng/mL (ref 0.00–0.08)

## 2012-08-29 MED ORDER — SODIUM CHLORIDE 0.9 % IJ SOLN
3.0000 mL | Freq: Two times a day (BID) | INTRAMUSCULAR | Status: DC
  Administered 2012-08-29 (×2): 3 mL via INTRAVENOUS

## 2012-08-29 MED ORDER — INSULIN ASPART 100 UNIT/ML ~~LOC~~ SOLN
0.0000 [IU] | Freq: Three times a day (TID) | SUBCUTANEOUS | Status: DC
  Administered 2012-08-29 – 2012-08-30 (×3): 2 [IU] via SUBCUTANEOUS

## 2012-08-29 MED ORDER — ACETAMINOPHEN 325 MG PO TABS
650.0000 mg | ORAL_TABLET | Freq: Once | ORAL | Status: AC
  Administered 2012-08-29: 650 mg via ORAL
  Filled 2012-08-29: qty 2

## 2012-08-29 MED ORDER — ALBUTEROL SULFATE (5 MG/ML) 0.5% IN NEBU
2.5000 mg | INHALATION_SOLUTION | RESPIRATORY_TRACT | Status: DC
  Administered 2012-08-29 (×2): 2.5 mg via RESPIRATORY_TRACT
  Filled 2012-08-29 (×3): qty 0.5

## 2012-08-29 MED ORDER — INSULIN ASPART 100 UNIT/ML ~~LOC~~ SOLN
0.0000 [IU] | Freq: Every day | SUBCUTANEOUS | Status: DC
  Administered 2012-08-29: 2 [IU] via SUBCUTANEOUS

## 2012-08-29 MED ORDER — ALBUTEROL SULFATE (5 MG/ML) 0.5% IN NEBU
2.5000 mg | INHALATION_SOLUTION | Freq: Four times a day (QID) | RESPIRATORY_TRACT | Status: DC
  Administered 2012-08-30 (×2): 2.5 mg via RESPIRATORY_TRACT
  Filled 2012-08-29 (×2): qty 0.5

## 2012-08-29 MED ORDER — IPRATROPIUM BROMIDE 0.02 % IN SOLN
0.5000 mg | Freq: Four times a day (QID) | RESPIRATORY_TRACT | Status: DC
  Administered 2012-08-30 (×2): 0.5 mg via RESPIRATORY_TRACT
  Filled 2012-08-29 (×2): qty 2.5

## 2012-08-29 MED ORDER — LEVOTHYROXINE SODIUM 88 MCG PO TABS
88.0000 ug | ORAL_TABLET | Freq: Every day | ORAL | Status: DC
Start: 2012-08-22 — End: 2012-08-30
  Administered 2012-08-30: 88 ug via ORAL
  Filled 2012-08-29 (×2): qty 1

## 2012-08-29 MED ORDER — ZOLPIDEM TARTRATE 5 MG PO TABS
5.0000 mg | ORAL_TABLET | Freq: Every day | ORAL | Status: AC
  Administered 2012-08-29: 5 mg via ORAL
  Filled 2012-08-29: qty 1

## 2012-08-29 MED ORDER — SIMVASTATIN 40 MG PO TABS
40.0000 mg | ORAL_TABLET | Freq: Every day | ORAL | Status: DC
  Administered 2012-08-29: 40 mg via ORAL
  Filled 2012-08-29 (×2): qty 1

## 2012-08-29 MED ORDER — LISINOPRIL 10 MG PO TABS
10.0000 mg | ORAL_TABLET | Freq: Every day | ORAL | Status: DC
  Administered 2012-08-29 – 2012-08-30 (×2): 10 mg via ORAL
  Filled 2012-08-29 (×2): qty 1

## 2012-08-29 MED ORDER — AMLODIPINE BESYLATE 10 MG PO TABS
10.0000 mg | ORAL_TABLET | Freq: Every day | ORAL | Status: DC
  Administered 2012-08-29 – 2012-08-30 (×2): 10 mg via ORAL
  Filled 2012-08-29 (×2): qty 1

## 2012-08-29 MED ORDER — SODIUM CHLORIDE 0.9 % IV SOLN
INTRAVENOUS | Status: AC
  Administered 2012-08-29: 13:00:00 via INTRAVENOUS

## 2012-08-29 MED ORDER — METHYLPREDNISOLONE SODIUM SUCC 125 MG IJ SOLR
125.0000 mg | Freq: Every day | INTRAMUSCULAR | Status: DC
  Administered 2012-08-29: 125 mg via INTRAVENOUS
  Filled 2012-08-29 (×2): qty 2

## 2012-08-29 MED ORDER — IPRATROPIUM BROMIDE 0.02 % IN SOLN
0.5000 mg | RESPIRATORY_TRACT | Status: DC
  Administered 2012-08-29 (×2): 0.5 mg via RESPIRATORY_TRACT
  Filled 2012-08-29 (×3): qty 2.5

## 2012-08-29 MED ORDER — FLUTICASONE PROPIONATE HFA 110 MCG/ACT IN AERO
1.0000 | INHALATION_SPRAY | Freq: Two times a day (BID) | RESPIRATORY_TRACT | Status: DC
  Administered 2012-08-30: 1 via RESPIRATORY_TRACT
  Filled 2012-08-29: qty 12

## 2012-08-29 MED ORDER — HEPARIN SODIUM (PORCINE) 5000 UNIT/ML IJ SOLN
5000.0000 [IU] | Freq: Three times a day (TID) | INTRAMUSCULAR | Status: DC
  Administered 2012-08-29 – 2012-08-30 (×3): 5000 [IU] via SUBCUTANEOUS
  Filled 2012-08-29 (×6): qty 1

## 2012-08-29 MED ORDER — LEVOFLOXACIN IN D5W 500 MG/100ML IV SOLN
500.0000 mg | INTRAVENOUS | Status: DC
  Administered 2012-08-29: 500 mg via INTRAVENOUS
  Filled 2012-08-29 (×2): qty 100

## 2012-08-29 NOTE — ED Provider Notes (Signed)
History     CSN: [id number]  Arrival date & time 08/29/12  0408   First MD Initiated Contact with Patient 08/29/12 [phone]      Chief Complaint  Patient presents with  . Shortness of Breath    (Consider location/radiation/quality/duration/timing/severity/associated sxs/prior treatment) Patient is a 57 y.o. female presenting with shortness of breath. The history is provided by the patient.  Shortness of Breath  57 year old female is actually denying shortness of breath to me. She states that she fell in her bathroom and was unable to get back up. As a matter of fact, for the last 2 days, she's been unable to stand and her legs are buckling under her. Symptoms are getting worse. She denies any pain in her legs. She is having a bifrontal headache. She states that her breathing is at her baseline. EMS was called to the home and noted low oxygen saturations and gave her breathing treatments and Solu-Medrol. She denies fever, chills. She denies nausea or vomiting. She has broken out in sweats. She does have history of COPD which has been severe enough to require intubation in the past.  Past Medical History  Diagnosis Date  . Hypertension   . Bipolar 1 disorder   . Insomnia   . Depression   . Suicidal thoughts   . COPD (chronic obstructive pulmonary disease)   . Complication of anesthesia     Hx resp distress during surgery  . Diabetes mellitus   . Hypothyroidism   . Hyperlipidemia   . Polysubstance abuse     H/O methamphetamine and alcohol abuse, clean x 6 years    Past Surgical History  Procedure Laterality Date  . Knee surgery      Left knee tendon surgery  . Finger surgery      middle left finger  . Hernia repair      x 3  . Appendectomy    . Abdominal hysterectomy    . Tubal ligation    . Colon surgery      "colon repair"    Family History  Problem Relation Age of Onset  . Multiple sclerosis Mother   . Alcoholism Father   . Depression Brother     History   Substance Use Topics  . Smoking status: Current Every Day Smoker -- 1.00 packs/day for 42 years  . Smokeless tobacco: Never Used  . Alcohol Use: No     Comment: H/O alcoholism, clean x 6 years.    OB History   Grav Para Term Preterm Abortions TAB SAB Ect Mult Living                  Review of Systems  Respiratory: Positive for shortness of breath.   All other systems reviewed and are negative.    Allergies  Morphine and related and Potassium-containing compounds  Home Medications   Current Outpatient Rx  Name  Route  Sig  Dispense  Refill  . albuterol (PROVENTIL) (5 MG/ML) 0.5% nebulizer solution   Nebulization   Take 2.5 mg by nebulization every 6 (six) hours as needed for wheezing or shortness of breath.         . ALPRAZolam (XANAX) 0.5 MG tablet   Oral   Take 1 tablet (0.5 mg total) by mouth 3 (three) times daily as needed for anxiety.   15 tablet   0   . amLODipine (NORVASC) 10 MG tablet   Oral   Take 10 mg by mouth every morning.          Marland Kitchen  busPIRone (BUSPAR) 15 MG tablet   Oral   Take 15 mg by mouth 2 (two) times daily.         . cloNIDine (CATAPRES) 0.1 MG tablet   Oral   Take 0.1 mg by mouth at bedtime.          . divalproex (DEPAKOTE ER) 500 MG 24 hr tablet   Oral   Take 1,000 mg by mouth 2 (two) times daily.         Marland Kitchen doxepin (SINEQUAN) 50 MG capsule   Oral   Take 150 mg by mouth at bedtime.         . fluticasone (FLOVENT HFA) 110 MCG/ACT inhaler   Inhalation   Inhale 1 puff into the lungs 2 (two) times daily.   1 Inhaler   1   . levothyroxine (SYNTHROID, LEVOTHROID) 88 MCG tablet   Oral   Take 88 mcg by mouth daily before breakfast.          . lisinopril (PRINIVIL,ZESTRIL) 10 MG tablet   Oral   Take 10 mg by mouth every morning.         Marland Kitchen LORazepam (ATIVAN) 0.5 MG tablet   Oral   Take 1 tablet (0.5 mg total) by mouth every 8 (eight) hours as needed for anxiety.   10 tablet   0   . metFORMIN (GLUCOPHAGE) 850 MG  tablet   Oral   Take 850 mg by mouth 2 (two) times daily with a meal.         . mirtazapine (REMERON) 15 MG tablet   Oral   Take 15 mg by mouth at bedtime.         . predniSONE (DELTASONE) 20 MG tablet      Take 3 po QD x 3d , then 2 po QD x 3d then 1 po QD x 3d   15 tablet   0   . primidone (MYSOLINE) 50 MG tablet   Oral   Take 150 mg by mouth at bedtime.          . propranolol (INDERAL) 40 MG tablet   Oral   Take 40 mg by mouth 2 (two) times daily.         . sertraline (ZOLOFT) 100 MG tablet   Oral   Take 100 mg by mouth every morning.          . simvastatin (ZOCOR) 40 MG tablet   Oral   Take 40 mg by mouth at bedtime.          Marland Kitchen tiotropium (SPIRIVA) 18 MCG inhalation capsule   Inhalation   Place 1 capsule (18 mcg total) into inhaler and inhale daily.   30 capsule   3   . zolpidem (AMBIEN) 10 MG tablet   Oral   Take 15 mg by mouth at bedtime.           BP 136/104  Pulse 106  Temp(Src) 98.4 F (36.9 C) (Oral)  Resp 22  SpO2 90%  Physical Exam  Nursing note and vitals reviewed.  57 year old female, resting comfortably and in no acute distress. Vital signs are significant for mild tachycardia with heart rate 106, mild tachypnea with respiratory rate of 22, and hypertension with blood pressure 136/104. Oxygen saturation is 90%, which is hypoxic. Head is normocephalic and atraumatic. PERRLA, EOMI. Oropharynx is clear. Neck is nontender and supple without adenopathy or JVD. There no carotid bruits. Back is nontender and there is no CVA  tenderness. Lungs have rales at the right base but no wheezes or rhonchi. Chest is nontender. Heart has regular rate and rhythm without murmur. Abdomen is soft, flat, nontender without masses or hepatosplenomegaly and peristalsis is normoactive. Extremities have 1+ edema, full range of motion is present. Skin is warm and dry without rash. Neurologic: She is awake, alert, oriented. Speech is slightly dysarthric but  she states it is normal given that she does not have her dentures in. To me,still seems abnormal even considering that she does not have her dentures. Cranial nerves are intact. Motor strength is 5/5 in all tested groups. She has slight ataxia with finger to nose on the right but not on the left. She is noted to be grossly unsteady with trying to sit upright.  ED Course  Procedures (including critical care time)  Results for orders placed during the hospital encounter of 08/29/12  BASIC METABOLIC PANEL      Result Value Range   Sodium 141  135 - 145 mEq/L   Potassium 4.6  3.5 - 5.1 mEq/L   Chloride 94 (*) 96 - 112 mEq/L   CO2 35 (*) 19 - 32 mEq/L   Glucose, Bld 209 (*) 70 - 99 mg/dL   BUN 16  6 - 23 mg/dL   Creatinine, Ser 2.13 (*) 0.50 - 1.10 mg/dL   Calcium 9.7  8.4 - 08.6 mg/dL   GFR calc non Af Amer >90  >90 mL/min   GFR calc Af Amer >90  >90 mL/min  CBC      Result Value Range   WBC 10.0  4.0 - 10.5 K/uL   RBC 5.02  3.87 - 5.11 MIL/uL   Hemoglobin 14.9  12.0 - 15.0 g/dL   HCT 57.8 (*) 46.9 - 62.9 %   MCV 92.4  78.0 - 100.0 fL   MCH 29.7  26.0 - 34.0 pg   MCHC 32.1  30.0 - 36.0 g/dL   RDW 52.8  41.3 - 24.4 %   Platelets 154  150 - 400 K/uL  CK      Result Value Range   Total CK 20  7 - 177 U/L  URINALYSIS, ROUTINE W REFLEX MICROSCOPIC      Result Value Range   Color, Urine YELLOW  YELLOW   APPearance CLOUDY (*) CLEAR   Specific Gravity, Urine 1.025  1.005 - 1.030   pH 6.0  5.0 - 8.0   Glucose, UA NEGATIVE  NEGATIVE mg/dL   Hgb urine dipstick TRACE (*) NEGATIVE   Bilirubin Urine NEGATIVE  NEGATIVE   Ketones, ur NEGATIVE  NEGATIVE mg/dL   Protein, ur NEGATIVE  NEGATIVE mg/dL   Urobilinogen, UA 0.2  0.0 - 1.0 mg/dL   Nitrite NEGATIVE  NEGATIVE   Leukocytes, UA NEGATIVE  NEGATIVE  PRO B NATRIURETIC PEPTIDE      Result Value Range   Pro B Natriuretic peptide (BNP) 45.9  0 - 125 pg/mL  HEPATIC FUNCTION PANEL      Result Value Range   Total Protein 7.1  6.0 - 8.3 g/dL    Albumin 3.8  3.5 - 5.2 g/dL   AST 12  0 - 37 U/L   ALT 12  0 - 35 U/L   Alkaline Phosphatase 89  39 - 117 U/L   Total Bilirubin 0.2 (*) 0.3 - 1.2 mg/dL   Bilirubin, Direct <0.1  0.0 - 0.3 mg/dL   Indirect Bilirubin NOT CALCULATED  0.3 - 0.9 mg/dL  URINE MICROSCOPIC-ADD ON  Result Value Range   Squamous Epithelial / LPF FEW (*) RARE   WBC, UA 0-2  <3 WBC/hpf   RBC / HPF 3-6  <3 RBC/hpf   Bacteria, UA FEW (*) RARE   Casts HYALINE CASTS (*) NEGATIVE  POCT I-STAT TROPONIN I      Result Value Range   Troponin i, poc 0.00  0.00 - 0.08 ng/mL   Comment 3            Dg Chest 2 View (if Patient Has Fever And/or Copd)  08/29/2012  *RADIOLOGY REPORT*  Clinical Data: Shortness of breath; persistent jerking sensations.  CHEST - 2 VIEW  Comparison: Chest radiograph performed 08/16/2012  Findings: The lungs are well-aerated.  Mild vascular congestion is noted, with mildly increased interstitial markings.  This could reflect minimal interstitial edema.  No pleural effusion or pneumothorax is seen.  The heart is borderline enlarged.  No acute osseous abnormalities are seen.  IMPRESSION: Mild vascular congestion and borderline cardiomegaly, with mildly increased interstitial markings.  This could reflect minimal interstitial edema.   Original Report Authenticated By: Tonia Ghent, M.D.    Ct Head Wo Contrast  08/29/2012  *RADIOLOGY REPORT*  Clinical Data: Dizziness and shortness of breath.  CT HEAD WITHOUT CONTRAST  Technique:  Contiguous axial images were obtained from the base of the skull through the vertex without contrast.  Comparison: None.  Findings: There is no evidence of acute infarction, mass lesion, or intra- or extra-axial hemorrhage on CT.  The posterior fossa, including the cerebellum, brainstem and fourth ventricle, is within normal limits.  The third and lateral ventricles, and basal ganglia are unremarkable in appearance.  The cerebral hemispheres are symmetric in appearance, with normal  gray- white differentiation.  No mass effect or midline shift is seen.  There is no evidence of fracture; visualized osseous structures are unremarkable in appearance.  The orbits are within normal limits. The paranasal sinuses and mastoid air cells are well-aerated.  No significant soft tissue abnormalities are seen.  IMPRESSION: Unremarkable noncontrast CT of the head.   Original Report Authenticated By: Tonia Ghent, M.D.    Mr Brain Wo Contrast  08/29/2012  *RADIOLOGY REPORT*  Clinical Data: Headache and dizziness.  Altered mental status. Diabetic hypertensive patient with hyperlipidemia.  MRI HEAD WITHOUT CONTRAST  Technique:  Multiplanar, multiecho pulse sequences of the brain and surrounding structures were obtained according to standard protocol without intravenous contrast.  Comparison: 08/29/2012 CT.  No comparison MR.  Findings: No acute infarct.  No intracranial hemorrhage.  No hydrocephalus.  No intracranial mass lesion detected on this unenhanced exam.  Major intracranial vascular structures are patent.  Mild exophthalmos.  Partial opacification right mastoid air cells.  No obstructing lesion posterior-superior nasopharynx.  Minimal paranasal sinus mucosal thickening.  Minimal degenerative changes of the cervical spine.  Cerebellar tonsils minimally low-lying but within range normal limits.  IMPRESSION: No acute infarct.  No intracranial hemorrhage.  No intracranial mass lesion detected on this unenhanced exam.  Partial opacification right mastoid air cells.   Original Report Authenticated By: Lacy Duverney, M.D.       Date: 08/29/2012  Rate: 104  Rhythm: sinus tachycardia  QRS Axis: normal  Intervals: normal  ST/T Wave abnormalities: normal  Conduction Disutrbances:none  Narrative Interpretation: Sinus tachycardia, left atrial hypertrophy. When compared with ECG of 08/16/2012, no significant changes are seen.  Old EKG Reviewed: unchanged    1. Ataxia   2. Dyspnea       MDM  Poor balance with inability to stand or sit. This could represent a posterior circulation stroke but could also represent rhabdomyolysis. It is noted that she is on simvastatin which can cause rhabdomyolysis. History status does appear to be at her baseline. I reviewed her medical records and with her last hospitalization, she was discharged with oxygen saturations of 88% with a comment that home oxygen would be needed. However, with the presence of rales and edema, beta natruretic protein level will be checked.  Workup has come back negative including negative MRI of the brain. However, when attempts were made to intubate the patient, she was very unsteady and became tachycardic and hypoxic. She clearly cannot go home safely so we'll need to be admitted. Case is discussed with internal medicine teaching service who agreed to come and admit the patient     Dione Booze, MD 08/29/12 [phone]

## 2012-08-29 NOTE — H&P (Signed)
Hospital Admission Note Date: 08/29/2012  Patient name: [name] [name] Medical record number: [id number] Date of birth: [birth year] Age: 57 y.o. Gender: female PCP: Kaleen Mask, MD  Medical Service: Internal Medicine Teaching Service   Attending physician: Lars Mage, MD    1st Contact: Denton Ar, MD Pager: [phone] 2nd Contact: Genella Mech, MD Pager: [phone]  After 5 pm or weekends: 1st Contact:      Pager: [phone] 2nd Contact:      Pager: [phone]  Chief Complaint:  Gait instability  History of Present Illness:  This is a 57 year old Caucasian female with past medical history significant for hypothyroidism, depression, Diabetes, COPD on 2 liters home oxygen, 2 week history of  tremor, bipolar disorder rule presented to the ED per EMS or gait instability and fall.. patient reports that she went to the bathroom at 3 AM and found herself on the ground and was not able to get up. She does not recall what exactly happened. Patient lives with her daughter in [place] who is present during the conversation. She noted that her mother was screaming and when she went to evaluate her she was sitting on the floor. Daughter reports that patient was shaking in all her extremities, she was somewhat pale, diaphoretic and confused. EMS was called. When evaluated patient's saturation were in the low 80s. Patient was given Solu-Medrol and nebulizer treatment. Patient herself noted she does not feel more short of breath than usual but daughter she normally uses oxygen 2 L at home but today she needed at least 3 L. Considering patient's gait instability patient reports that she has been experiencing since 2-3 weeks episodes of loss of control of her legs but most of the time she would be able to catch herself. She further reports about tremor in her hands. The daughter noted when her mother is  holding a remote control she would shake significantly . The daughter  does not recall any memory loss  but patient endorses trouble thinking and finding words in the evening times and occasionally droopiness of her left eye. Again this started 2-3 weeks ago. Patient was evaluated by her PCP 2 weeks ago for tremor and was prescribed ( primidone ? And propranolol ? which was giving her headache ).  Her hand writing has been getting worse. She further reports daily headache ( throbbing in nature) which wakes her up in the morning since 1 month. She denies any history of headache, vision changes, hearing, weakness or numbness in her arms. She had history of back pain in the past ( admitted with symptoms for back pain in 03/2012 . Lumbar xray showed   Lower lumbar spondylosis with trace L4-L5 anterolisthesis. No acute osseous abnormality). She may have went to a chiropractor or physical therapist but after one session her back pain completely resolved.  Patient further endorses chronic diarrhea. She is evaluated by Dr. Bosie Clos. Has not yet had a colonoscopy. Patient noted that she would have significant diarrhea with certain foods but not with dairy  She is evaluated by Dr.Blankmann for her psychiatry this order   Meds: No current facility-administered medications on file prior to encounter.   Current Outpatient Prescriptions on File Prior to Encounter  Medication Sig Dispense Refill  . albuterol (PROVENTIL) (5 MG/ML) 0.5% nebulizer solution Take 2.5 mg by nebulization every 6 (six) hours as needed for wheezing or shortness of breath.      . ALPRAZolam (XANAX) 0.5 MG tablet Take 1 tablet (0.5 mg total) by mouth 3 (three)  times daily as needed for anxiety.  15 tablet  0  . amLODipine (NORVASC) 10 MG tablet Take 10 mg by mouth every morning.       . busPIRone (BUSPAR) 15 MG tablet Take 15 mg by mouth 3 (three) times daily.       . cloNIDine (CATAPRES) 0.1 MG tablet Take 0.2 mg by mouth at bedtime.       . divalproex (DEPAKOTE ER) 500 MG 24 hr tablet Take 1,000 mg by mouth 2 (two) times daily.      Marland Kitchen  doxepin (SINEQUAN) 50 MG capsule Take 150 mg by mouth at bedtime.      . fluticasone (FLOVENT HFA) 110 MCG/ACT inhaler Inhale 1 puff into the lungs 2 (two) times daily.  1 Inhaler  1  . levothyroxine (SYNTHROID, LEVOTHROID) 88 MCG tablet Take 88 mcg by mouth daily before breakfast.       . lisinopril (PRINIVIL,ZESTRIL) 10 MG tablet Take 10 mg by mouth every morning.      Marland Kitchen LORazepam (ATIVAN) 0.5 MG tablet Take 1 tablet (0.5 mg total) by mouth every 8 (eight) hours as needed for anxiety.  10 tablet  0  . metFORMIN (GLUCOPHAGE) 850 MG tablet Take 850 mg by mouth 2 (two) times daily with a meal.      . mirtazapine (REMERON) 15 MG tablet Take 7.5 mg by mouth at bedtime.       . primidone (MYSOLINE) 50 MG tablet Take 100 mg by mouth at bedtime.       . propranolol (INDERAL) 40 MG tablet Take 40 mg by mouth 2 (two) times daily.      . sertraline (ZOLOFT) 100 MG tablet Take 100 mg by mouth every morning.       . simvastatin (ZOCOR) 40 MG tablet Take 40 mg by mouth at bedtime.       Marland Kitchen tiotropium (SPIRIVA) 18 MCG inhalation capsule Place 1 capsule (18 mcg total) into inhaler and inhale daily.  30 capsule  3  . zolpidem (AMBIEN) 10 MG tablet Take 15 mg by mouth at bedtime.        Allergies: Allergies as of 08/29/2012 - Review Complete 08/29/2012  Allergen Reaction Noted  . Diclofenac Tinitus 08/29/2012  . Morphine and related Nausea And Vomiting 01/23/2012  . Potassium-containing compounds Itching 08/16/2012   Past Medical History  Diagnosis Date  . Hypertension   . Bipolar 1 disorder   . Insomnia   . Depression   . Suicidal thoughts   . COPD (chronic obstructive pulmonary disease)   . Complication of anesthesia     Hx resp distress during surgery  . Diabetes mellitus   . Hypothyroidism   . Hyperlipidemia   . Polysubstance abuse Quit 2008     H/O methamphetamine and alcohol abuse, clean x 6 years  . Tobacco abuse   . Chronic diarrhea     Followed by Dr Bosie Clos    Past Surgical History   Procedure Laterality Date  . Knee surgery      Left knee tendon surgery  . Finger surgery      middle left finger  . Hernia repair      x 3  . Appendectomy    . Abdominal hysterectomy    . Tubal ligation    . Colon surgery      "colon repair"   Family History  Problem Relation Age of Onset  . Multiple sclerosis Mother   . Alcoholism Father   . Depression  Brother    History   Social History  . Marital Status: Divorced    Spouse Name: N/A    Number of Children: 2  . Years of Education: N/A   Occupational History  . Disabled.    Social History Main Topics  . Smoking status: Current Every Day Smoker -- 1.00 packs/day for 42 years  . Smokeless tobacco: Never Used     Comment: Quit since 1 week.  . Alcohol Use: No     Comment: H/O alcoholism, clean x 6 years.  . Drug Use: No     Comment: high school - speed, hx prescription drugs 04/13/12 - no drug use now  . Sexually Active: Not on file   Other Topics Concern  . Not on file   Social History Narrative   Divorced.  Lives with daughter in [place]. Patient moved from [place] state to [place] in August of 2013. Patient has remained sober from alcohol and amphetamine for about 6 years.     Review of Systems: Constitutional: Noted  fever, chills, diaphoresis, weight gain .  HEENT: Denies photophobia, eye pain, redness, hearing loss, ear pain, congestion, sore throat, rhinorrhea, neck pain, neck stiffness and tinnitus.   Respiratory: Noted mild SOB, DOE, cough, chest tightness,  and wheezing.   Cardiovascular: Denies chest pain, palpitations but noted leg swelling.  Gastrointestinal: Denies nausea, vomiting, abdominal pain, blood in stool but episodes of  diarrhea, constipation,  Genitourinary: Denies dysuria, urgency, frequency, hematuria, flank pain and difficulty urinating.  Musculoskeletal: Denies myalgias, back pain,arthralgias .  Skin: Denies pallor, rash and wound.  Neurological: Noted dizziness and  weakness, headache but denies seizures, syncope,numbness Hematological: Denies adenopathy.  Psychiatric/Behavioral: Denies suicidal ideation.   Physical Exam: Blood pressure 132/82, pulse 90, temperature 98.5 F (36.9 C), temperature source Oral, resp. rate 21, height 5\' 5"  (1.651 m), weight 245 lb 6 oz (111.3 kg), SpO2 90.00%.  Constitutional: Vital signs reviewed.  Patient is a well-developed and well-nourished  in mild acute distress and cooperative with exam. Alert and oriented x3.  Head: Normocephalic and atraumatic Mouth: no erythema or exudates, mucous membranes are dry. Dentures in place Eyes: PERRL, EOMI, conjunctivae normal, No scleral icterus.  Neck: Supple, Trachea midline normal ROM, No JVD, mass, thyromegaly, or carotid bruit present.  Cardiovascular: tachycardic RR, S1 normal, S2 normal,  pulses symmetric and intact bilaterally, 1-2 + pitting edema  Pulmonary/Chest: diffuse wheezes throughout, no  rhonchi Abdominal: Soft. Non-tender, non-distended, bowel sounds are normal,  or guarding present. Clean scare present  GU: no CVA tenderness Musculoskeletal: No joint deformities, erythema, or stiffness, ROM full and no nontender Hematology: no cervical  adenopathy.  Neurological: A&O x3, Strenght is normal and symmetric bilaterally, cranial nerve II-XII are grossly intact, no focal motor deficit, sensory intact to light touch bilaterally.  Finger to nose normal. Alternating movement normal. Significant resting tremor present .  Romberg positive. Significant trouble walking.  Skin: Warm, dry and intact. No rash, cyanosis, or clubbing.  Psychiatric: Normal mood and affect. speech and behavior is normal.   Lab results: Basic Metabolic Panel:  Recent Labs  40/98/11 0426  NA 141  K 4.6  CL 94*  CO2 35*  GLUCOSE 209*  BUN 16  CREATININE 0.42*  CALCIUM 9.7   Liver Function Tests:  Recent Labs  08/29/12 0426  AST 12  ALT 12  ALKPHOS 89  BILITOT 0.2*  PROT 7.1   ALBUMIN 3.8    CBC:  Recent Labs  08/29/12 0426  08/29/12 1140  WBC 10.0 6.9  HGB 14.9 14.3  HCT 46.4* 44.8  MCV 92.4 90.3  PLT 154 148*   Cardiac Enzymes:  Recent Labs  08/29/12 0426  CKTOTAL 20   BNP:  Recent Labs  08/29/12 0426  PROBNP 45.9   Urinalysis:  Recent Labs  08/29/12 0430  COLORURINE YELLOW  LABSPEC 1.025  PHURINE 6.0  GLUCOSEU NEGATIVE  HGBUR TRACE*  BILIRUBINUR NEGATIVE  KETONESUR NEGATIVE  PROTEINUR NEGATIVE  UROBILINOGEN 0.2  NITRITE NEGATIVE  LEUKOCYTESUR NEGATIVE    Imaging results:  Dg Chest 2 View (if Patient Has Fever And/or Copd)  08/29/2012  *RADIOLOGY REPORT*  Clinical Data: Shortness of breath; persistent jerking sensations.  CHEST - 2 VIEW  Comparison: Chest radiograph performed 08/16/2012  Findings: The lungs are well-aerated.  Mild vascular congestion is noted, with mildly increased interstitial markings.  This could reflect minimal interstitial edema.  No pleural effusion or pneumothorax is seen.  The heart is borderline enlarged.  No acute osseous abnormalities are seen.  IMPRESSION: Mild vascular congestion and borderline cardiomegaly, with mildly increased interstitial markings.  This could reflect minimal interstitial edema.   Original Report Authenticated By: Tonia Ghent, M.D.    Ct Head Wo Contrast  08/29/2012  *RADIOLOGY REPORT*  Clinical Data: Dizziness and shortness of breath.  CT HEAD WITHOUT CONTRAST  Technique:  Contiguous axial images were obtained from the base of the skull through the vertex without contrast.  Comparison: None.  Findings: There is no evidence of acute infarction, mass lesion, or intra- or extra-axial hemorrhage on CT.  The posterior fossa, including the cerebellum, brainstem and fourth ventricle, is within normal limits.  The third and lateral ventricles, and basal ganglia are unremarkable in appearance.  The cerebral hemispheres are symmetric in appearance, with normal gray- white differentiation.  No  mass effect or midline shift is seen.  There is no evidence of fracture; visualized osseous structures are unremarkable in appearance.  The orbits are within normal limits. The paranasal sinuses and mastoid air cells are well-aerated.  No significant soft tissue abnormalities are seen.  IMPRESSION: Unremarkable noncontrast CT of the head.   Original Report Authenticated By: Tonia Ghent, M.D.    Mr Brain Wo Contrast  08/29/2012  *RADIOLOGY REPORT*  Clinical Data: Headache and dizziness.  Altered mental status. Diabetic hypertensive patient with hyperlipidemia.  MRI HEAD WITHOUT CONTRAST  Technique:  Multiplanar, multiecho pulse sequences of the brain and surrounding structures were obtained according to standard protocol without intravenous contrast.  Comparison: 08/29/2012 CT.  No comparison MR.  Findings: No acute infarct.  No intracranial hemorrhage.  No hydrocephalus.  No intracranial mass lesion detected on this unenhanced exam.  Major intracranial vascular structures are patent.  Mild exophthalmos.  Partial opacification right mastoid air cells.  No obstructing lesion posterior-superior nasopharynx.  Minimal paranasal sinus mucosal thickening.  Minimal degenerative changes of the cervical spine.  Cerebellar tonsils minimally low-lying but within range normal limits.  IMPRESSION: No acute infarct.  No intracranial hemorrhage.  No intracranial mass lesion detected on this unenhanced exam.  Partial opacification right mastoid air cells.   Original Report Authenticated By: Lacy Duverney, M.D.     Assessment & Plan by Problem: Active Problems:   Hypertension   Depression   COPD exacerbation   Diabetes mellitus   Hyperlipidemia   Hypothyroidism   Gait instability   Tremor  #Gait instability, Tremor, episode of confusion,headache  trouble finding words ? Left eye droopiness ? Unclear etiology. CT head and  MRI head was negative for any acute process. Differential diagnosis includes vitamin B12  deficiency in the setting of mild absorption ( chronic diarrhea ), hypothyroidism , seizures or medication induced. Patient has a long list of antipsychotics and antidepressant medication.Reviewing the medications side effects is as followed:  Xanax: Causes drowsiness, depression, impaired coordination, fatigue, memory impairment this and tree, tremor (1-5%); Buspirone: Dizziness, headache, blurred vision, confusion, tremor; Clonidine somnolence, drowsiness, dizziness; Depakote headache, sometimes, tremor, dizziness ataxia, Doxepin: Sedation, insomnia, confusion, extrapyramidal symptoms, dizziness, tachycardia; Ativan: Sedation, dizziness, weakness, unsteadiness; Remeron: Somnolence, weakness, weight gain, dizziness, disturbance in thinking, peripheral edema, tremor; Primidone: Dysarthria, ataxia, vertigo, drowsiness Zoloft: Diarrhea, insomnia, dizziness, fatigue, sweating. Plan:  - Admit to telemetry -Discontinued Xanax, Buspirone,  Clonidine, Depakote, doxepin, Remeron, primidone, Zoloft - Will obtain Depakote level - Will obtain UDS, Vitamin B12 level, TSH - Neuro check q. 4 hours - Bedrest  - Fall risk precaution - Orthostatic blood pressure  - Consider consult neurology for possible EEG - Restart anti-depressive and bipolar are medication gradually.  #COPD exacerbation:  - Duonebs, Solumedrol 125 mg IV daily, Levaquin - Blood cultures - Continue fluticasone - Holding Spiriva - Consider pulmonology consult inpatient. Patient has no pulmonologist whom she follows on a regular basis. - Consider PT/OT and dietitian consult  # Hypothyroidism - Continue Synthroid 88 MCG daily - Will obtain TSH  # DM, Type 2  - Discontinued metformin - Sliding scale insulin - Will obtain hemoglobin A1c  # Hypertension - Continue Norvasc  And lisinopril for now. - If patient has orthostatic hypotension may consider change in management - Start aspirin  # Bipolar disorder, currently no suicidal  ideation - Holding medication in the setting of neurologic symptoms. - Consider discussion with primary psychiatrist for deescalation medication  #History of hyperlipidemia  - Continue simvastatin 40 mg daily - Obtain lipid panel  # Tobacco abuse; patient reports she quit one week ago and would like to continue  # DVT prophylax: Heparin subcutaneous  Dispo: Disposition is deferred at this time, awaiting improvement of current medical problems.   The patient does have a current PCP Kaleen Mask, MD), therefore will not be requiring [facility] follow-up after discharge.   The patient does not have transportation limitations that hinder transportation to clinic appointments.  SignedAlmyra Deforest 08/29/2012, 12:33 PM

## 2012-08-29 NOTE — ED Notes (Signed)
Patient transported to MRI 

## 2012-08-29 NOTE — ED Notes (Signed)
Patient presents from home; woke up with SOB. Hx COPD/Asthma. At EMS arrival, SPO2 85% with decreased breath sounds throughout with wheezing to right upper lobes. Received Albuterol 5 mg on scene. No changes in breath sounds after treatment. 2nd breathing treatment of Albuterol 5 mg/Atrovent 0.5 mg given as well as Solumedrol 125 mg. Patient remains with decreased breath sounds. EMS attempted to administer Mag 2g. Halfway through administration, patient reported "not feeling well" and c/o feeling burning, sweaty and anxious. Mag administration stopped. 12 lead EKG unremarkable. No complaints of pain.

## 2012-08-29 NOTE — ED Notes (Signed)
MD at bedside. 

## 2012-08-29 NOTE — Progress Notes (Signed)
Pt attempted to use bedpan to void and was unable. Bladder scan showed 219 cc. Will continue to monitor. Informed patient to call if she begins to feel uncomfortable. Duwaine Maxin, RN

## 2012-08-29 NOTE — ED Notes (Signed)
Patient transported to radiology for CT and CXR

## 2012-08-29 NOTE — ED Notes (Signed)
Attempted ambulation with pt. Pt stated she felt very dizzy, lightheaded with standing. Pt almost lost balance with standing, pulse ox dropped to 82%, HR up to 120. Pt sat back down, assisted into bed. Dr. Preston Fleeting aware.

## 2012-08-29 NOTE — ED Notes (Signed)
Internal Medicine attending at bedside with patient and family.

## 2012-08-30 LAB — GLUCOSE, CAPILLARY: Glucose-Capillary: 173 mg/dL — ABNORMAL HIGH (ref 70–99)

## 2012-08-30 LAB — CBC
Platelets: 141 10*3/uL — ABNORMAL LOW (ref 150–400)
RBC: 4.8 MIL/uL (ref 3.87–5.11)
RDW: 13.9 % (ref 11.5–15.5)
WBC: 7.4 10*3/uL (ref 4.0–10.5)

## 2012-08-30 MED ORDER — PREDNISONE (PAK) 10 MG PO TABS: 10.0000 mg | ORAL_TABLET | Freq: Every day | ORAL | Status: DC

## 2012-08-30 MED ORDER — PREDNISONE (PAK) 10 MG PO TABS: ORAL_TABLET | ORAL | Status: DC

## 2012-08-30 MED ORDER — ACETAMINOPHEN 325 MG PO TABS
650.0000 mg | ORAL_TABLET | Freq: Four times a day (QID) | ORAL | Status: DC | PRN
  Administered 2012-08-30: 650 mg via ORAL
  Filled 2012-08-30: qty 2

## 2012-08-30 MED ORDER — DOXYCYCLINE HYCLATE 100 MG PO TABS: 100.0000 mg | ORAL_TABLET | Freq: Two times a day (BID) | ORAL | Status: DC

## 2012-08-30 MED ORDER — ATORVASTATIN CALCIUM 20 MG PO TABS
20.0000 mg | ORAL_TABLET | Freq: Every day | ORAL | Status: DC
  Filled 2012-08-30: qty 1

## 2012-08-30 MED ORDER — SERTRALINE HCL 100 MG PO TABS
100.0000 mg | ORAL_TABLET | Freq: Every morning | ORAL | Status: DC
  Administered 2012-08-30: 100 mg via ORAL
  Filled 2012-08-30: qty 1

## 2012-08-30 MED ORDER — DIVALPROEX SODIUM ER 500 MG PO TB24
1000.0000 mg | ORAL_TABLET | Freq: Two times a day (BID) | ORAL | Status: DC
  Administered 2012-08-30: 1000 mg via ORAL
  Filled 2012-08-30 (×2): qty 2

## 2012-08-30 MED ORDER — LORAZEPAM 0.5 MG PO TABS: 0.5000 mg | ORAL_TABLET | Freq: Three times a day (TID) | ORAL | Status: DC | PRN

## 2012-08-30 MED ORDER — DOXYCYCLINE HYCLATE 100 MG PO TABS
100.0000 mg | ORAL_TABLET | Freq: Two times a day (BID) | ORAL | Status: DC
  Administered 2012-08-30: 100 mg via ORAL
  Filled 2012-08-30 (×2): qty 1

## 2012-08-30 NOTE — H&P (Signed)
Internal Medicine Attending Admission Note Date: 08/30/2012  Patient name: [name] [name] Medical record number: [id number] Date of birth: [birth year] Age: 57 y.o. Gender: female  I saw and evaluated the patient. I reviewed the resident's note and I agree with the resident's findings and plan as documented in the resident's note.  Chief Complaint(s): Gait instability  History - key components related to admission: 57 year old female with past medical history most significant for hypothyroidism, depression, diabetes type 2, COPD, bipolar disorder who presented to [facility] with chief complaints of gait instability and fall. Patient is on multiple psychiatric medications. No changes in medications. Gait instability has been progressing for last 2-3 weeks to a point where she fell early this morning when she went to the bathroom. Patient denies hitting her head, no dizziness, no chest pain, shortness of breath, palpitations, weakness or numbness anywhere in the body, urinary incontinence, bowel incontinence noted.  Review of system is positive for headaches, tremors and some word finding difficulty which is not acute.  Patient told me that she is using the bathroom without any gait instability and feels much better as compared to yesterday.  14 point review of system is negative except as noted above.  Past medical history, past surgical history, medications, family history and social history is as per resident's note and was reviewed.  Physical Exam - key components related to admission:  Filed Vitals:   08/29/12 1350 08/29/12 1751 08/29/12 2012 08/30/12 0514  BP: 132/62 134/67 125/64 112/54  Pulse: 93 95 93 92  Temp: 97.9 F (36.6 C) 98.3 F (36.8 C) 98.1 F (36.7 C) 98.4 F (36.9 C)  TempSrc: Oral Oral Oral Oral  Resp: 18 18 20 20   Height:      Weight:    240 lb 4.8 oz (109 kg)  SpO2: 94% 92% 94% 94%  Physical Exam: General: Vital signs reviewed and noted. Well-developed,  well-nourished, in no acute distress; alert, appropriate and cooperative throughout examination.  Head: Normocephalic, atraumatic.  Eyes: PERRL, EOMI, No signs of anemia or jaundince.  Nose: Mucous membranes moist, not inflammed, nonerythematous.  Throat: Oropharynx nonerythematous, no exudate appreciated.   Neck: No deformities, masses, or tenderness noted.Supple, No carotid Bruits, no JVD.  Lungs:  Normal respiratory effort. Clear to auscultation BL without crackles or wheezes.  Heart: RRR. S1 and S2 normal without gallop, murmur, or rubs.  Abdomen:  BS normoactive. Soft, Nondistended, non-tender.  No masses or organomegaly.  Extremities: No pretibial edema.  Neurologic: A&O X3, CN II - XII are grossly intact. Motor strength is 5/5 in the all 4 extremities, Sensations intact to light touch, Cerebellar signs negative.  Skin: No visible rashes, scars.     Lab results:   Basic Metabolic Panel:  Recent Labs  40/98/11 0426  NA 141  K 4.6  CL 94*  CO2 35*  GLUCOSE 209*  BUN 16  CREATININE 0.42*  CALCIUM 9.7   Liver Function Tests:  Recent Labs  08/29/12 0426  AST 12  ALT 12  ALKPHOS 89  BILITOT 0.2*  PROT 7.1  ALBUMIN 3.8   CBC:  Recent Labs  08/29/12 1140 08/30/12 0533  WBC 6.9 7.4  HGB 14.3 13.9  HCT 44.8 43.0  MCV 90.3 89.6  PLT 148* 141*   Cardiac Enzymes:  Recent Labs  08/29/12 0426 08/29/12 1150 08/29/12 1734  CKTOTAL 20  --   --   TROPONINI  --  <0.30 <0.30   CBG:  Recent Labs  08/29/12 1255 08/29/12  1636 08/29/12 2050 08/30/12 0624  GLUCAP 189* 184* 221* 173*   Hemoglobin A1C:  Recent Labs  08/29/12 1140  HGBA1C 6.8*   Thyroid Function Tests:  Recent Labs  08/29/12 1140  TSH 0.455   Anemia Panel:  Recent Labs  08/29/12 1140  VITAMINB12 413    Imaging results:  Dg Chest 2 View (if Patient Has Fever And/or Copd)  08/29/2012  *RADIOLOGY REPORT*  Clinical Data: Shortness of breath; persistent jerking sensations.  CHEST  - 2 VIEW  Comparison: Chest radiograph performed 08/16/2012  Findings: The lungs are well-aerated.  Mild vascular congestion is noted, with mildly increased interstitial markings.  This could reflect minimal interstitial edema.  No pleural effusion or pneumothorax is seen.  The heart is borderline enlarged.  No acute osseous abnormalities are seen.  IMPRESSION: Mild vascular congestion and borderline cardiomegaly, with mildly increased interstitial markings.  This could reflect minimal interstitial edema.   Original Report Authenticated By: Tonia Ghent, M.D.    Ct Head Wo Contrast  08/29/2012  *RADIOLOGY REPORT*  Clinical Data: Dizziness and shortness of breath.  CT HEAD WITHOUT CONTRAST  Technique:  Contiguous axial images were obtained from the base of the skull through the vertex without contrast.  Comparison: None.  Findings: There is no evidence of acute infarction, mass lesion, or intra- or extra-axial hemorrhage on CT.  The posterior fossa, including the cerebellum, brainstem and fourth ventricle, is within normal limits.  The third and lateral ventricles, and basal ganglia are unremarkable in appearance.  The cerebral hemispheres are symmetric in appearance, with normal gray- white differentiation.  No mass effect or midline shift is seen.  There is no evidence of fracture; visualized osseous structures are unremarkable in appearance.  The orbits are within normal limits. The paranasal sinuses and mastoid air cells are well-aerated.  No significant soft tissue abnormalities are seen.  IMPRESSION: Unremarkable noncontrast CT of the head.   Original Report Authenticated By: Tonia Ghent, M.D.    Mr Brain Wo Contrast  08/29/2012  *RADIOLOGY REPORT*  Clinical Data: Headache and dizziness.  Altered mental status. Diabetic hypertensive patient with hyperlipidemia.  MRI HEAD WITHOUT CONTRAST  Technique:  Multiplanar, multiecho pulse sequences of the brain and surrounding structures were obtained according to  standard protocol without intravenous contrast.  Comparison: 08/29/2012 CT.  No comparison MR.  Findings: No acute infarct.  No intracranial hemorrhage.  No hydrocephalus.  No intracranial mass lesion detected on this unenhanced exam.  Major intracranial vascular structures are patent.  Mild exophthalmos.  Partial opacification right mastoid air cells.  No obstructing lesion posterior-superior nasopharynx.  Minimal paranasal sinus mucosal thickening.  Minimal degenerative changes of the cervical spine.  Cerebellar tonsils minimally low-lying but within range normal limits.  IMPRESSION: No acute infarct.  No intracranial hemorrhage.  No intracranial mass lesion detected on this unenhanced exam.  Partial opacification right mastoid air cells.   Original Report Authenticated By: Lacy Duverney, M.D.     Assessment & Plan by Problem:  Principal Problem:   Gait instability Active Problems:   Hypertension   Depression   COPD exacerbation   Diabetes mellitus   Hyperlipidemia   Hypothyroidism   Tremor  Patient is a 57 year old female with past medical history most significant for type 2 diabetes, hypertension, hyperlipidemia, hypothyroidism and multiple psychiatric problems who presented with progressive gait instability since last 2-3 weeks and fall on the morning of admission. With negative CT scan and MRI, acute infarct has been ruled out as a cause  of her gait instability. I believe that the most likely cause of patient's neurological problems and musculoskeletal instability is several different psychiatric medications. Patient is currently on multiple different psychiatric medications which have very similar side effect profile. I suspect that these medications are responsible for current symptoms. -We plan to continue ativan, Ambien, Depakote and Zoloft and discontinued the rest of psychiatric medications at this time.  -Treat for COPD exacerbation with antibiotics and steroids -Plan to discharge home  later today on antibiotics and steroid taper -Obtain records from her psychiatrist and placed them in the shadow chart for our records. Patient has appointment with psychiatrist on may 2 nd which she is encouraged to keep.   Plan was discussed with the patient and she is OK with discharge today.  Lars Mage MD Faculty-Internal Medicine Residency Program

## 2012-08-30 NOTE — Care Management Note (Signed)
    Page 1 of 1   08/30/2012     2:09:18 PM   CARE MANAGEMENT NOTE 08/30/2012  Patient:  [name] [name], [name] [name]   Account Number:  [id number]  Date Initiated:  08/30/2012  Documentation initiated by:  Kylii Ennis  Subjective/Objective Assessment:   PT ADM ON 08/29/12 WITH TREMORS, COPD EXACERBATION.  PTA, PT LIVES WITH DAUGHTER AND IS INDEPENDENT.  SHE IS ON CHRONIC HOME O2.     Action/Plan:   WILL FOLLOW FOR HOME NEEDS AS PT PROGRESSES.   Anticipated DC Date:  08/30/2012   Anticipated DC Plan:  HOME/SELF CARE      DC Planning Services  CM consult      Choice offered to / List presented to:             Status of service:  Completed, signed off Medicare Important Message given?   (If response is "NO", the following Medicare IM given date fields will be blank) Date Medicare IM given:   Date Additional Medicare IM given:    Discharge Disposition:  HOME/SELF CARE  Per UR Regulation:  Reviewed for med. necessity/level of care/duration of stay  If discussed at Long Length of Stay Meetings, dates discussed:    Comments:  08/30/12 Jaymir Struble,RN,BSN [phone] PT FOR DC HOME TODAY.  PT ACTIVE WITH [facility] FOR HOME O2.  SHE DOES NOT HAVE PORTABLE TANK AT HOSPITAL FOR TRANSPORT HOME. NOTIFIED [facility]; THEY WILL BRING A TANK TO PT'S ROOM ASAP.

## 2012-08-30 NOTE — Progress Notes (Addendum)
Patient given discharge instructions and prescriptions electronically sent to pts pharmacy.Patient sent home with home o2, pt is on chronic home o2, All questions answered. Patient will be discharged home as ordered. [name] [name], Randall An RN

## 2012-08-30 NOTE — Progress Notes (Signed)
PHARMACIST - PHYSICIAN ORDER COMMUNICATION  CONCERNING: P&T Medication Policy on Statin Substitutions  DESCRIPTION:    Among home meds continued are Amlodipine 10 mg daily and Simvastatin 40 mg qhs.  Suggested max dose of Simvastatin is 20 mg, when on Amlodipine, due to increased risk of myopathy/rhabdo.   LFTs ok on admission.   ACTION TAKEN:  Atorvastatin 20 mg qhs has been substituted for Simvastatin 40 mg qhs.  Consider changing outpatient statin.  Nicolette Bang, RPh 08/30/2012 9:16 AM

## 2012-08-30 NOTE — Progress Notes (Signed)
Subjective: Pt state she is doing better this morning. Her tremors have improved greatly and she was able to pick up her coffee and eat her food without difficulty this morning. Patient states that she feels this episode was related to stress and admits that she is under a great deal of stress at home. Her daughter is about to have a baby and she had just gotten into an argument with her daughter and daughter's boyfriend the night of the fall.   She denies fever, chills, cough, sputum production, chest pain.   Objective: Vital signs in last 24 hours: Filed Vitals:   08/29/12 1350 08/29/12 1751 08/29/12 2012 08/30/12 0514  BP: 132/62 134/67 125/64 112/54  Pulse: 93 95 93 92  Temp: 97.9 F (36.6 C) 98.3 F (36.8 C) 98.1 F (36.7 C) 98.4 F (36.9 C)  TempSrc: Oral Oral Oral Oral  Resp: 18 18 20 20   Height:      Weight:    240 lb 4.8 oz (109 kg)  SpO2: 94% 92% 94% 94%   Weight change:   Intake/Output Summary (Last 24 hours) at 08/30/12 0709 Last data filed at 08/30/12 0500  Gross per 24 hour  Intake    480 ml  Output    950 ml  Net   -470 ml    Physical Exam Blood pressure 112/54, pulse 92, temperature 98.4 F (36.9 C), temperature source Oral, resp. rate 20, height 5\' 5"  (1.651 m), weight 240 lb 4.8 oz (109 kg), SpO2 94.00%. General:  No acute distress, alert and oriented x 3 HEENT:  PERRL, EOMI, slightly dry mucous membranes Cardiovascular:  Regular rate and rhythm, no murmurs, rubs or gallops Respiratory:  End expiratory wheezes, improved from yesterday, no resp distress Abdomen:  Soft, nondistended, nontender, bowel sounds present Extremities:  Warm and well-perfused, 1+ edema.  Skin: Warm, dry, no rashes Neuro: Mildly anxious appearing  Lab Results: Basic Metabolic Panel:  Recent Labs Lab 08/29/12 0426  NA 141  K 4.6  CL 94*  CO2 35*  GLUCOSE 209*  BUN 16  CREATININE 0.42*  CALCIUM 9.7   Liver Function Tests:  Recent Labs Lab 08/29/12 0426  AST 12   ALT 12  ALKPHOS 89  BILITOT 0.2*  PROT 7.1  ALBUMIN 3.8   CBC:  Recent Labs Lab 08/29/12 1140 08/30/12 0533  WBC 6.9 7.4  HGB 14.3 13.9  HCT 44.8 43.0  MCV 90.3 89.6  PLT 148* 141*   Cardiac Enzymes:  Recent Labs Lab 08/29/12 0426 08/29/12 1150 08/29/12 1734  CKTOTAL 20  --   --   TROPONINI  --  <0.30 <0.30   BNP:  Recent Labs Lab 08/29/12 0426  PROBNP 45.9   CBG:  Recent Labs Lab 08/29/12 1255 08/29/12 1636 08/29/12 2050 08/30/12 0624  GLUCAP 189* 184* 221* 173*   Hemoglobin A1C:  Recent Labs Lab 08/29/12 1140  HGBA1C 6.8*   Thyroid Function Tests:  Recent Labs Lab 08/29/12 1140  TSH 0.455   Anemia Panel:  Recent Labs Lab 08/29/12 1140  VITAMINB12 413   Urine Drug Screen: Drugs of Abuse     Component Value Date/Time   LABOPIA POSITIVE* 08/29/2012 1923   COCAINSCRNUR NONE DETECTED 08/29/2012 1923   LABBENZ NONE DETECTED 08/29/2012 1923   AMPHETMU NONE DETECTED 08/29/2012 1923   THCU NONE DETECTED 08/29/2012 1923   LABBARB POSITIVE* 08/29/2012 1923    Urinalysis:  Recent Labs Lab 08/29/12 0430  COLORURINE YELLOW  LABSPEC 1.025  PHURINE 6.0  GLUCOSEU  NEGATIVE  HGBUR TRACE*  BILIRUBINUR NEGATIVE  KETONESUR NEGATIVE  PROTEINUR NEGATIVE  UROBILINOGEN 0.2  NITRITE NEGATIVE  LEUKOCYTESUR NEGATIVE    Studies/Results: Dg Chest 2 View (if Patient Has Fever And/or Copd)  08/29/2012  *RADIOLOGY REPORT*  Clinical Data: Shortness of breath; persistent jerking sensations.  CHEST - 2 VIEW  Comparison: Chest radiograph performed 08/16/2012  Findings: The lungs are well-aerated.  Mild vascular congestion is noted, with mildly increased interstitial markings.  This could reflect minimal interstitial edema.  No pleural effusion or pneumothorax is seen.  The heart is borderline enlarged.  No acute osseous abnormalities are seen.  IMPRESSION: Mild vascular congestion and borderline cardiomegaly, with mildly increased interstitial markings.  This could  reflect minimal interstitial edema.   Original Report Authenticated By: Tonia Ghent, M.D.    Ct Head Wo Contrast  08/29/2012  *RADIOLOGY REPORT*  Clinical Data: Dizziness and shortness of breath.  CT HEAD WITHOUT CONTRAST  Technique:  Contiguous axial images were obtained from the base of the skull through the vertex without contrast.  Comparison: None.  Findings: There is no evidence of acute infarction, mass lesion, or intra- or extra-axial hemorrhage on CT.  The posterior fossa, including the cerebellum, brainstem and fourth ventricle, is within normal limits.  The third and lateral ventricles, and basal ganglia are unremarkable in appearance.  The cerebral hemispheres are symmetric in appearance, with normal gray- white differentiation.  No mass effect or midline shift is seen.  There is no evidence of fracture; visualized osseous structures are unremarkable in appearance.  The orbits are within normal limits. The paranasal sinuses and mastoid air cells are well-aerated.  No significant soft tissue abnormalities are seen.  IMPRESSION: Unremarkable noncontrast CT of the head.   Original Report Authenticated By: Tonia Ghent, M.D.    Mr Brain Wo Contrast  08/29/2012  *RADIOLOGY REPORT*  Clinical Data: Headache and dizziness.  Altered mental status. Diabetic hypertensive patient with hyperlipidemia.  MRI HEAD WITHOUT CONTRAST  Technique:  Multiplanar, multiecho pulse sequences of the brain and surrounding structures were obtained according to standard protocol without intravenous contrast.  Comparison: 08/29/2012 CT.  No comparison MR.  Findings: No acute infarct.  No intracranial hemorrhage.  No hydrocephalus.  No intracranial mass lesion detected on this unenhanced exam.  Major intracranial vascular structures are patent.  Mild exophthalmos.  Partial opacification right mastoid air cells.  No obstructing lesion posterior-superior nasopharynx.  Minimal paranasal sinus mucosal thickening.  Minimal  degenerative changes of the cervical spine.  Cerebellar tonsils minimally low-lying but within range normal limits.  IMPRESSION: No acute infarct.  No intracranial hemorrhage.  No intracranial mass lesion detected on this unenhanced exam.  Partial opacification right mastoid air cells.   Original Report Authenticated By: Lacy Duverney, M.D.    Medications:  Medications reviewed  Scheduled Meds: . ipratropium  0.5 mg Nebulization QID   And  . albuterol  2.5 mg Nebulization QID  . amLODipine  10 mg Oral Daily  . fluticasone  1 puff Inhalation BID  . heparin  5,000 Units Subcutaneous Q8H  . insulin aspart  0-5 Units Subcutaneous QHS  . insulin aspart  0-9 Units Subcutaneous TID WC  . levofloxacin (LEVAQUIN) IV  500 mg Intravenous Q24H  . levothyroxine  88 mcg Oral QAC breakfast  . lisinopril  10 mg Oral Daily  . methylPREDNISolone (SOLU-MEDROL) injection  125 mg Intravenous Daily  . simvastatin  40 mg Oral QHS  . sodium chloride  3 mL Intravenous Q12H  Continuous Infusions:  PRN Meds:.acetaminophen  Assessment/Plan: Patient Active Problem List  Diagnosis  . Hypertension  . Bipolar 1 disorder  . Insomnia  . Depression  . Suicidal thoughts  . COPD exacerbation  . Acute-on-chronic respiratory failure  . Low back pain  . Thrombocytopenia  . Diabetes mellitus  . Tobacco abuse  . Hyperlipidemia  . Acute respiratory failure  . Hypothyroidism  . Gait instability  . Tremor     Gait instability/Fall Patient is on many medications at home for her mood which have side effects of tremor, ataxia, confusion which may be contributing to her symptoms along with increased anxiety given the stressors at home currently. She has also had a binge smoking episode with recent cessation so there may be a component of nicotine withdrawal. Doubt this is seizure activity given chronicity - patient states that she has had tremors and episodes like this for many years - as well as pt is already on  depakote and levels are therapeutic (58)  -will obtain records from psychiatrist Dr. Kendrick Fries to get recent med list -for now, will dc buspirone, doxepin, primidone, remeron, and xanax -continue depakote, zoloft, ativan  COPD exacerbation with hypoxemia Wheezing improved today. Patient without increased cough or sputum production though she has had recent documented hypoxia on several occasions which was initially concerning for COPD exac. CXR with mild interstitial edema. Less likely to be CHF as no known hx of heart failure but no ECHOs in the [facility] medical record that I can find. No crackles on exam. Patient attrubutes her trouble breathing to her recent increased frequency of smoking. SHe has quit multiple times in the past.  -will order 2-D ECHO -cont solumedrol, duonebs q6 -switch levaquin to doxycycline 100mg  BID for 5 days, currently on day 2 of therapy  Hypothryoidism TSH normal -cont home synthroid dose  Type 2 DM - on oral meds -A1c 6.8 on admission -cont to hold metformin during hospitalization -SSI  Hypertension -continue home norvasc, lisinopril, aspirin -pt on home clonidine which was held on admission, BP 112/54 this morning so we will continue to hold and watch for rebound  Bipolar Patient states that she has depressive thoughts and that with her bipolar she often become suicidal. No current thoughts of hurting herself or others. See discussion above -will get med list from psychiatrist -cont ativan, depakote, zoloft for now  DVT heparin  Dispo -anticipate dc later today or tomorrow with PCP and psych follow up     LOS: 1 day   [place] Ar 08/30/2012, 7:09 AM

## 2012-08-30 NOTE — Discharge Summary (Signed)
Internal Medicine Teaching [facility] Discharge Note  Name: [name] [name] MRN: [id number] DOB: [birth year] 57 y.o.  Date of Admission: 08/29/2012  4:08 AM Date of Discharge: 08/30/2012 Attending Physician: Lars Mage, MD  Discharge Diagnosis: Principal Problem:   Gait instability Active Problems:   Hypertension   Depression   COPD exacerbation   Diabetes mellitus   Hyperlipidemia   Hypothyroidism   Tremor   Discharge Medications:   Medication List    STOP taking these medications       ALPRAZolam 0.5 MG tablet  Commonly known as:  XANAX     busPIRone 15 MG tablet  Commonly known as:  BUSPAR     cetirizine 10 MG tablet  Commonly known as:  ZYRTEC     cloNIDine 0.1 MG tablet  Commonly known as:  CATAPRES     doxepin 50 MG capsule  Commonly known as:  SINEQUAN     mirtazapine 15 MG tablet  Commonly known as:  REMERON     primidone 50 MG tablet  Commonly known as:  MYSOLINE     propranolol 40 MG tablet  Commonly known as:  INDERAL      TAKE these medications       albuterol (5 MG/ML) 0.5% nebulizer solution  Commonly known as:  PROVENTIL  Take 2.5 mg by nebulization every 6 (six) hours as needed for wheezing or shortness of breath.     amLODipine 10 MG tablet  Commonly known as:  NORVASC  Take 10 mg by mouth every morning.     bisacodyl 5 MG EC tablet  Commonly known as:  DULCOLAX  Take 5-10 mg by mouth daily as needed for constipation.     divalproex 500 MG 24 hr tablet  Commonly known as:  DEPAKOTE ER  Take 1,000 mg by mouth 2 (two) times daily.     doxycycline 100 MG tablet  Commonly known as:  VIBRA-TABS  Take 1 tablet (100 mg total) by mouth every 12 (twelve) hours.     fluticasone 110 MCG/ACT inhaler  Commonly known as:  FLOVENT HFA  Inhale 1 puff into the lungs 2 (two) times daily.     levothyroxine 88 MCG tablet  Commonly known as:  SYNTHROID, LEVOTHROID  Take 88 mcg by mouth daily before breakfast.     lisinopril 10 MG  tablet  Commonly known as:  PRINIVIL,ZESTRIL  Take 10 mg by mouth every morning.     LORazepam 0.5 MG tablet  Commonly known as:  ATIVAN  Take 1 tablet (0.5 mg total) by mouth every 8 (eight) hours as needed for anxiety.     metFORMIN 850 MG tablet  Commonly known as:  GLUCOPHAGE  Take 850 mg by mouth 2 (two) times daily with a meal.     predniSONE 10 MG tablet  Commonly known as:  STERAPRED UNI-PAK  Take 1 tablet (10 mg total) by mouth daily. Take 40 mg for 3 days, Take 30 mg for 3 days, Take 20 mg for 3 days, Take 10 mg for 3 days and then stop     sertraline 100 MG tablet  Commonly known as:  ZOLOFT  Take 100 mg by mouth every morning.     simvastatin 40 MG tablet  Commonly known as:  ZOCOR  Take 40 mg by mouth at bedtime.     tiotropium 18 MCG inhalation capsule  Commonly known as:  SPIRIVA  Place 1 capsule (18 mcg total) into inhaler and inhale daily.  zolpidem 10 MG tablet  Commonly known as:  AMBIEN  Take 15 mg by mouth at bedtime.        Disposition and follow-up:   [name] was discharged from [facility] in Stable condition.  At the hospital follow up visit please address the following:  -please refer patient to pulmonology for evaluation of COPD management and PFTs -f/u PCP and psychiatrist -review medication list and reduce number of medications   Follow-up Appointments:     Follow-up Information   Follow up with Kaleen Mask, MD On 09/10/2012. (At 1:45 pm )    Contact information:   [address] [place] [address] [phone]       Follow up with Kendrick Fries On 09/21/2012.     Discharge Orders   Future Orders Complete By Expires     Call MD for:  difficulty breathing, headache or visual disturbances  As directed     Call MD for:  persistant dizziness or light-headedness  As directed     Call MD for:  persistant nausea and vomiting  As directed     Call MD for:  temperature >100.4  As  directed     Diet - low sodium heart healthy  As directed     Increase activity slowly  As directed        Consultations:  none  Procedures Performed:  Dg Chest 2 View (if Patient Has Fever And/or Copd)  08/29/2012  *RADIOLOGY REPORT*  Clinical Data: Shortness of breath; persistent jerking sensations.  CHEST - 2 VIEW  Comparison: Chest radiograph performed 08/16/2012  Findings: The lungs are well-aerated.  Mild vascular congestion is noted, with mildly increased interstitial markings.  This could reflect minimal interstitial edema.  No pleural effusion or pneumothorax is seen.  The heart is borderline enlarged.  No acute osseous abnormalities are seen.  IMPRESSION: Mild vascular congestion and borderline cardiomegaly, with mildly increased interstitial markings.  This could reflect minimal interstitial edema.   Original Report Authenticated By: Tonia Ghent, M.D.    Dg Chest 2 View (if Patient Has Fever And/or Copd)  08/16/2012  *RADIOLOGY REPORT*  Clinical Data: Headaches, dizziness, confusion.  CHEST - 2 VIEW  Comparison: 07/08/2012  Findings: Mild cardiomegaly stable.  Relatively low lung volumes with resultant crowding of perihilar and bibasilar bronchovascular markings.  No confluent airspace infiltrate or overt edema.  No effusion.  Regional bones unremarkable.  IMPRESSION:  1.  Stable mild cardiomegaly.   Original Report Authenticated By: D. Andria Rhein, MD    Ct Head Wo Contrast  08/29/2012  *RADIOLOGY REPORT*  Clinical Data: Dizziness and shortness of breath.  CT HEAD WITHOUT CONTRAST  Technique:  Contiguous axial images were obtained from the base of the skull through the vertex without contrast.  Comparison: None.  Findings: There is no evidence of acute infarction, mass lesion, or intra- or extra-axial hemorrhage on CT.  The posterior fossa, including the cerebellum, brainstem and fourth ventricle, is within normal limits.  The third and lateral ventricles, and basal ganglia are  unremarkable in appearance.  The cerebral hemispheres are symmetric in appearance, with normal gray- white differentiation.  No mass effect or midline shift is seen.  There is no evidence of fracture; visualized osseous structures are unremarkable in appearance.  The orbits are within normal limits. The paranasal sinuses and mastoid air cells are well-aerated.  No significant soft tissue abnormalities are seen.  IMPRESSION: Unremarkable noncontrast CT of the head.   Original Report  Authenticated By: Tonia Ghent, M.D.    Mr Brain Wo Contrast  08/29/2012  *RADIOLOGY REPORT*  Clinical Data: Headache and dizziness.  Altered mental status. Diabetic hypertensive patient with hyperlipidemia.  MRI HEAD WITHOUT CONTRAST  Technique:  Multiplanar, multiecho pulse sequences of the brain and surrounding structures were obtained according to standard protocol without intravenous contrast.  Comparison: 08/29/2012 CT.  No comparison MR.  Findings: No acute infarct.  No intracranial hemorrhage.  No hydrocephalus.  No intracranial mass lesion detected on this unenhanced exam.  Major intracranial vascular structures are patent.  Mild exophthalmos.  Partial opacification right mastoid air cells.  No obstructing lesion posterior-superior nasopharynx.  Minimal paranasal sinus mucosal thickening.  Minimal degenerative changes of the cervical spine.  Cerebellar tonsils minimally low-lying but within range normal limits.  IMPRESSION: No acute infarct.  No intracranial hemorrhage.  No intracranial mass lesion detected on this unenhanced exam.  Partial opacification right mastoid air cells.   Original Report Authenticated By: Lacy Duverney, M.D.     Admission HPI:  This is a 57 year old Caucasian female with past medical history significant for hypothyroidism, depression, Diabetes, COPD on 2 liters home oxygen, 2 week history of tremor, bipolar disorder rule presented to the ED per EMS or gait instability and fall.. patient reports  that she went to the bathroom at 3 AM and found herself on the ground and was not able to get up. She does not recall what exactly happened. Patient lives with her daughter in [place] who is present during the conversation. She noted that her mother was screaming and when she went to evaluate her she was sitting on the floor. Daughter reports that patient was shaking in all her extremities, she was somewhat pale, diaphoretic and confused. EMS was called. When evaluated patient's saturation were in the low 80s. Patient was given Solu-Medrol and nebulizer treatment. Patient herself noted she does not feel more short of breath than usual but daughter she normally uses oxygen 2 L at home but today she needed at least 3 L.  Considering patient's gait instability patient reports that she has been experiencing since 2-3 weeks episodes of loss of control of her legs but most of the time she would be able to catch herself. She further reports about tremor in her hands. The daughter noted when her mother is holding a remote control she would shake significantly . The daughter does not recall any memory loss but patient endorses trouble thinking and finding words in the evening times and occasionally droopiness of her left eye. Again this started 2-3 weeks ago. Patient was evaluated by her PCP 2 weeks ago for tremor and was prescribed ( primidone ? And propranolol ? which was giving her headache ). Her hand writing has been getting worse. She further reports daily headache ( throbbing in nature) which wakes her up in the morning since 1 month. She denies any history of headache, vision changes, hearing, weakness or numbness in her arms. She had history of back pain in the past ( admitted with symptoms for back pain in 03/2012 . Lumbar xray showed  Lower lumbar spondylosis with trace L4-L5 anterolisthesis. No acute osseous abnormality). She may have went to a chiropractor or physical therapist but after one session her  back pain completely resolved.  Patient further endorses chronic diarrhea. She is evaluated by Dr. Bosie Clos. Has not yet had a colonoscopy. Patient noted that she would have significant diarrhea with certain foods but not with dairy  She is evaluated  by Dr.Blankmann for her psychiatry this order   Hospital Course by problem list: Principal Problem:   Gait instability Active Problems:   Hypertension   Depression   COPD exacerbation   Diabetes mellitus   Hyperlipidemia   Hypothyroidism   Tremor   Gait instability/Fall  Patient presents with gait instability, tremor, and after a fall in the bathroom at home. CT and MRI of the head did not reveal any acute stroke or mass lesion and was unrevealing as to the cause of fall. Patient is on many medications at home for her mood which have side effects of tremor, ataxia, confusion which may be contributing to her symptoms along with increased anxiety given the stressors at home currently. She also admitted to recent binge smoking episode with abrupt cessation so there may be a component of nicotine withdrawal. Doubt this is seizure activity given chronicity - patient states that she has had tremors and episodes like this for many years - as well as pt is already on depakote and levels are therapeutic (58). During this hospitalization, we discontinued her buspirone, doxepin, primidone, Remeron, as these were superfluous medications with negative side effects that were likely contributing to her symptoms. The day of discharge, patient's tremor had improved greatly, and she was able to walk without difficulty. Patient was set to followup with her primary care doctor after discharge.  COPD exacerbation with hypoxemia  The patient has a history of COPD on 2 L of oxygen at home. She was found after her fall at home with oxygen saturation in the low 80s. She denies any increased cough or sputum production though she has had hypoxia on several recent occasions.  Chest x-ray on admission showed mild interstitial edema, but she did not have crackles on exam. She did have significant wheezing on admission, which improved on the day of discharge. Patient does not have a history of heart failure, however, may consider a 2-D echo as an outpatient. During this hospitalization, patient was given DuoNebs, prednisone, Levaquin, treating for COPD exacerbation. On discharge, she was given a prescription for doxycycline to treat for a total of 5 days. Her wheezing had much improved and she was able to ambulate on 2-3L of oxygen which she can continue at home. Patient was given an oxygen tank for continuous therapy en route to her house. DC with prednisone taper, doxycycline, nebulizers, spiriva and flovent. She should continue smoking cessation counseling as an outpatient. Also recommend referral to pulmonologist for COPD management as well as pulmonary function tests.  Bipolar  Patient admitted to occasional depressive thoughts and that with her bipolar she often become suicidal. No thoughts of hurting herself or others during this admission. Patient follows with [facility] as an outpatient and is on multiple psychoactive medications with significant side effects that likely contributed to her presenting symptoms. Therefore, we discontinued buspirone, doxepin, primidone, Remeron, Xanax. She was continued on Ativan, Depakote, Zoloft. Recommend reviewing patient's medication list to avoid harmful side effects and interactions including serotonin syndrome. Patient to followup with her psychiatrist Dr. Tarri Abernethy at [facility] after discharge.   Discharge Vitals:  BP 129/61  Pulse 92  Temp(Src) 98.4 F (36.9 C) (Oral)  Resp 20  Ht 5\' 5"  (1.651 m)  Wt 240 lb 4.8 oz (109 kg)  BMI 39.99 kg/m2  SpO2 90%  Discharge Labs:  Results for orders placed during the hospital encounter of 08/29/12 (from the past 24 hour(s))  GLUCOSE, CAPILLARY     Status: Abnormal   Collection Time  08/29/12  4:36 PM      Result Value Range   Glucose-Capillary 184 (*) 70 - 99 mg/dL   Comment 1 Documented in Chart     Comment 2 Notify RN    TROPONIN I     Status: None   Collection Time    08/29/12  5:34 PM      Result Value Range   Troponin I <0.30  <0.30 ng/mL  URINE RAPID DRUG SCREEN (HOSP PERFORMED)     Status: Abnormal   Collection Time    08/29/12  7:23 PM      Result Value Range   Opiates POSITIVE (*) NONE DETECTED   Cocaine NONE DETECTED  NONE DETECTED   Benzodiazepines NONE DETECTED  NONE DETECTED   Amphetamines NONE DETECTED  NONE DETECTED   Tetrahydrocannabinol NONE DETECTED  NONE DETECTED   Barbiturates POSITIVE (*) NONE DETECTED  GLUCOSE, CAPILLARY     Status: Abnormal   Collection Time    08/29/12  8:50 PM      Result Value Range   Glucose-Capillary 221 (*) 70 - 99 mg/dL  CBC     Status: Abnormal   Collection Time    08/30/12  5:33 AM      Result Value Range   WBC 7.4  4.0 - 10.5 K/uL   RBC 4.80  3.87 - 5.11 MIL/uL   Hemoglobin 13.9  12.0 - 15.0 g/dL   HCT 19.1  47.8 - 29.5 %   MCV 89.6  78.0 - 100.0 fL   MCH 29.0  26.0 - 34.0 pg   MCHC 32.3  30.0 - 36.0 g/dL   RDW 62.1  30.8 - 65.7 %   Platelets 141 (*) 150 - 400 K/uL  GLUCOSE, CAPILLARY     Status: Abnormal   Collection Time    08/30/12  6:24 AM      Result Value Range   Glucose-Capillary 173 (*) 70 - 99 mg/dL  GLUCOSE, CAPILLARY     Status: Abnormal   Collection Time    08/30/12 11:11 AM      Result Value Range   Glucose-Capillary 176 (*) 70 - 99 mg/dL   Comment 1 Documented in Chart     Comment 2 Notify RN      Signed: Denton Ar 08/30/2012, 1:31 PM   Time Spent on Discharge: 35 minutes Services Ordered on Discharge: none Equipment Ordered on Discharge: none

## 2012-09-04 LAB — CULTURE, BLOOD (ROUTINE X 2): Culture: NO GROWTH

## 2012-10-04 ENCOUNTER — Encounter: Payer: Self-pay | Admitting: *Deleted

## 2012-10-24 ENCOUNTER — Ambulatory Visit: Payer: Medicaid Other | Admitting: Neurology

## 2012-10-30 ENCOUNTER — Encounter: Payer: Self-pay | Admitting: Internal Medicine

## 2012-10-31 ENCOUNTER — Emergency Department (HOSPITAL_COMMUNITY)
Admission: EM | Admit: 2012-10-31 | Discharge: 2012-10-31 | Disposition: A | Discharge status: Home / Self Care | Payer: Medicaid Other | Attending: Emergency Medicine | Admitting: Emergency Medicine

## 2012-10-31 ENCOUNTER — Encounter (HOSPITAL_COMMUNITY): Payer: Self-pay | Admitting: Emergency Medicine

## 2012-10-31 DIAGNOSIS — F1910 Other psychoactive substance abuse, uncomplicated: Secondary | ICD-10-CM | POA: Insufficient documentation

## 2012-10-31 DIAGNOSIS — E119 Type 2 diabetes mellitus without complications: Secondary | ICD-10-CM | POA: Insufficient documentation

## 2012-10-31 DIAGNOSIS — F3289 Other specified depressive episodes: Secondary | ICD-10-CM | POA: Insufficient documentation

## 2012-10-31 DIAGNOSIS — Z8679 Personal history of other diseases of the circulatory system: Secondary | ICD-10-CM | POA: Insufficient documentation

## 2012-10-31 DIAGNOSIS — J4489 Other specified chronic obstructive pulmonary disease: Secondary | ICD-10-CM | POA: Insufficient documentation

## 2012-10-31 DIAGNOSIS — Z888 Allergy status to other drugs, medicaments and biological substances status: Secondary | ICD-10-CM | POA: Insufficient documentation

## 2012-10-31 DIAGNOSIS — Z87891 Personal history of nicotine dependence: Secondary | ICD-10-CM | POA: Insufficient documentation

## 2012-10-31 DIAGNOSIS — Z79899 Other long term (current) drug therapy: Secondary | ICD-10-CM | POA: Insufficient documentation

## 2012-10-31 DIAGNOSIS — R109 Unspecified abdominal pain: Secondary | ICD-10-CM

## 2012-10-31 DIAGNOSIS — Z885 Allergy status to narcotic agent status: Secondary | ICD-10-CM | POA: Insufficient documentation

## 2012-10-31 DIAGNOSIS — R1084 Generalized abdominal pain: Secondary | ICD-10-CM | POA: Insufficient documentation

## 2012-10-31 DIAGNOSIS — J449 Chronic obstructive pulmonary disease, unspecified: Secondary | ICD-10-CM | POA: Insufficient documentation

## 2012-10-31 DIAGNOSIS — E039 Hypothyroidism, unspecified: Secondary | ICD-10-CM | POA: Insufficient documentation

## 2012-10-31 DIAGNOSIS — I10 Essential (primary) hypertension: Secondary | ICD-10-CM | POA: Insufficient documentation

## 2012-10-31 DIAGNOSIS — F329 Major depressive disorder, single episode, unspecified: Secondary | ICD-10-CM | POA: Insufficient documentation

## 2012-10-31 DIAGNOSIS — E785 Hyperlipidemia, unspecified: Secondary | ICD-10-CM | POA: Insufficient documentation

## 2012-10-31 DIAGNOSIS — R197 Diarrhea, unspecified: Secondary | ICD-10-CM | POA: Insufficient documentation

## 2012-10-31 LAB — CBC WITH DIFFERENTIAL/PLATELET
Basophils Absolute: 0 10*3/uL (ref 0.0–0.1)
Basophils Relative: 0 % (ref 0–1)
Hemoglobin: 15.4 g/dL — ABNORMAL HIGH (ref 12.0–15.0)
MCHC: 33.1 g/dL (ref 30.0–36.0)
Monocytes Relative: 6 % (ref 3–12)
Neutro Abs: 4.7 10*3/uL (ref 1.7–7.7)
Neutrophils Relative %: 64 % (ref 43–77)
WBC: 7.4 10*3/uL (ref 4.0–10.5)

## 2012-10-31 LAB — COMPREHENSIVE METABOLIC PANEL
AST: 11 U/L (ref 0–37)
Albumin: 4 g/dL (ref 3.5–5.2)
Alkaline Phosphatase: 89 U/L (ref 39–117)
BUN: 14 mg/dL (ref 6–23)
Chloride: 105 mEq/L (ref 96–112)
Potassium: 3.8 mEq/L (ref 3.5–5.1)
Total Bilirubin: 0.2 mg/dL — ABNORMAL LOW (ref 0.3–1.2)

## 2012-10-31 LAB — URINALYSIS, ROUTINE W REFLEX MICROSCOPIC
Glucose, UA: NEGATIVE mg/dL
Ketones, ur: 15 mg/dL — AB
Protein, ur: 30 mg/dL — AB

## 2012-10-31 LAB — URINE MICROSCOPIC-ADD ON

## 2012-10-31 MED ORDER — MORPHINE SULFATE 4 MG/ML IJ SOLN
4.0000 mg | Freq: Once | INTRAMUSCULAR | Status: AC
  Administered 2012-10-31: 4 mg via INTRAVENOUS
  Filled 2012-10-31: qty 1

## 2012-10-31 MED ORDER — SODIUM CHLORIDE 0.9 % IV BOLUS (SEPSIS)
1000.0000 mL | Freq: Once | INTRAVENOUS | Status: AC
  Administered 2012-10-31: 1000 mL via INTRAVENOUS

## 2012-10-31 MED ORDER — ONDANSETRON HCL 4 MG/2ML IJ SOLN
4.0000 mg | Freq: Once | INTRAMUSCULAR | Status: AC
  Administered 2012-10-31: 4 mg via INTRAVENOUS
  Filled 2012-10-31: qty 2

## 2012-10-31 MED ORDER — DIPHENOXYLATE-ATROPINE 2.5-0.025 MG PO TABS
2.0000 | ORAL_TABLET | ORAL | Status: AC
  Administered 2012-10-31: 2 via ORAL
  Filled 2012-10-31: qty 2

## 2012-10-31 MED ORDER — DIPHENOXYLATE-ATROPINE 2.5-0.025 MG PO TABS: 1.0000 | ORAL_TABLET | Freq: Four times a day (QID) | ORAL | Status: DC | PRN

## 2012-10-31 NOTE — ED Notes (Addendum)
Pt c/o generalized abd pain and cramping with diarrhea x 1 month and weight loss; pt on home O2 for COPD

## 2012-10-31 NOTE — ED Provider Notes (Signed)
History     CSN: [id number]  Arrival date & time 10/31/12  1027   First MD Initiated Contact with Patient 10/31/12 1208      Chief Complaint  Patient presents with  . Diarrhea  . Abdominal Pain    (Consider location/radiation/quality/duration/timing/severity/associated sxs/prior treatment) HPI Comments: Patient is a 57 year old female with a past medical history of bipolar disorder, COPD, diabetes and hypertension who presents with a 2 month history of diarrhea with associated abdominal pain. The pain is located in her generalized abdomen and does not radiate. The pain is described as cramping and severe. The pain is intermittent and made worse by eating. No alleviating factors. The patient has tried Maalox for symptoms which made her constipated after drinking 1 bottle in 1 day. The diarrhea returned after the constipation passed. Patient denies fever, headache, NVD, chest pain, SOB, dysuria.     Past Medical History  Diagnosis Date  . Hypertension   . Bipolar 1 disorder   . Insomnia   . Depression   . Suicidal thoughts   . COPD (chronic obstructive pulmonary disease)   . Complication of anesthesia     Hx resp distress during surgery  . Diabetes mellitus   . Hypothyroidism   . Hyperlipidemia   . Polysubstance abuse Quit 2008     H/O methamphetamine and alcohol abuse, clean x 6 years  . Tobacco abuse   . Chronic diarrhea     Followed by Dr Bosie Clos   . Shortness of breath   . [id number](478.2)     Past Surgical History  Procedure Laterality Date  . Knee surgery      Left knee tendon surgery  . Finger surgery      middle left finger  . Hernia repair      x 3  . Appendectomy    . Abdominal hysterectomy    . Tubal ligation    . Colon surgery      "colon repair"    Family History  Problem Relation Age of Onset  . Multiple sclerosis Mother   . Alcoholism Father   . Depression Brother     History  Substance Use Topics  . Smoking status: Former Smoker -- 1.00  packs/day for 42 years    Quit date: 08/21/2012  . Smokeless tobacco: Never Used     Comment: Quit since 1 week.  . Alcohol Use: No     Comment: H/O alcoholism, clean x 6 years.    OB History   Grav Para Term Preterm Abortions TAB SAB Ect Mult Living                  Review of Systems  Gastrointestinal: Positive for abdominal pain and diarrhea.  All other systems reviewed and are negative.    Allergies  Diclofenac; Morphine and related; and Potassium-containing compounds  Home Medications   Current Outpatient Rx  Name  Route  Sig  Dispense  Refill  . albuterol (PROVENTIL) (5 MG/ML) 0.5% nebulizer solution   Nebulization   Take 2.5 mg by nebulization every 6 (six) hours as needed for wheezing or shortness of breath.         Marland Kitchen amLODipine (NORVASC) 10 MG tablet   Oral   Take 10 mg by mouth every morning.          . clonazePAM (KLONOPIN) 0.5 MG tablet   Oral   Take 0.5 mg by mouth 2 (two) times daily as needed for anxiety.         Marland Kitchen  divalproex (DEPAKOTE ER) 500 MG 24 hr tablet   Oral   Take 1,000 mg by mouth 2 (two) times daily.         . fluticasone (FLOVENT HFA) 110 MCG/ACT inhaler   Inhalation   Inhale 1 puff into the lungs 2 (two) times daily.   1 Inhaler   1   . gabapentin (NEURONTIN) 300 MG capsule   Oral   Take 300 mg by mouth 3 (three) times daily.         Marland Kitchen levothyroxine (SYNTHROID, LEVOTHROID) 88 MCG tablet   Oral   Take 88 mcg by mouth daily before breakfast.          . lisinopril (PRINIVIL,ZESTRIL) 10 MG tablet   Oral   Take 10 mg by mouth every morning.         Marland Kitchen LORazepam (ATIVAN) 0.5 MG tablet   Oral   Take 1 tablet (0.5 mg total) by mouth every 8 (eight) hours as needed for anxiety.   10 tablet   0   . metFORMIN (GLUCOPHAGE) 850 MG tablet   Oral   Take 850 mg by mouth 2 (two) times daily with a meal.         . mirtazapine (REMERON) 15 MG tablet   Oral   Take 15 mg by mouth at bedtime.         . sertraline  (ZOLOFT) 100 MG tablet   Oral   Take 100 mg by mouth 2 (two) times daily.          . simvastatin (ZOCOR) 40 MG tablet   Oral   Take 40 mg by mouth at bedtime.          Marland Kitchen tiotropium (SPIRIVA) 18 MCG inhalation capsule   Inhalation   Place 1 capsule (18 mcg total) into inhaler and inhale daily.   30 capsule   3     BP 147/76  Pulse 85  Temp(Src) 98.5 F (36.9 C) (Oral)  Resp 18  SpO2 93%  Physical Exam  Nursing note and vitals reviewed. Constitutional: She is oriented to person, place, and time. She appears well-developed and well-nourished. No distress.  HENT:  Head: Normocephalic and atraumatic.  Eyes: Conjunctivae and EOM are normal. No scleral icterus.  Neck: Normal range of motion.  Cardiovascular: Normal rate and regular rhythm.  Exam reveals no gallop and no friction rub.   No murmur heard. Pulmonary/Chest: Effort normal and breath sounds normal. She has no wheezes. She has no rales. She exhibits no tenderness.  Abdominal: Soft. She exhibits no distension. There is no tenderness. There is no rebound and no guarding.  No focal abdominal tenderness or peritoneal signs.   Musculoskeletal: Normal range of motion.  Neurological: She is alert and oriented to person, place, and time.  Speech is goal-oriented. Moves limbs without ataxia.   Skin: Skin is warm and dry.  Psychiatric: She has a normal mood and affect. Her behavior is normal.    ED Course  Procedures (including critical care time)  Labs Reviewed  CBC WITH DIFFERENTIAL - Abnormal; Notable for the following:    RBC 5.33 (*)    Hemoglobin 15.4 (*)    HCT 46.5 (*)    Platelets 121 (*)    All other components within normal limits  COMPREHENSIVE METABOLIC PANEL - Abnormal; Notable for the following:    Creatinine, Ser 0.38 (*)    Total Bilirubin 0.2 (*)    All other components within normal limits  URINALYSIS,  ROUTINE W REFLEX MICROSCOPIC - Abnormal; Notable for the following:    Color, Urine AMBER (*)     Specific Gravity, Urine 1.031 (*)    Hgb urine dipstick SMALL (*)    Bilirubin Urine SMALL (*)    Ketones, ur 15 (*)    Protein, ur 30 (*)    Leukocytes, UA SMALL (*)    All other components within normal limits  URINE MICROSCOPIC-ADD ON - Abnormal; Notable for the following:    Squamous Epithelial / LPF MANY (*)    All other components within normal limits   No results found.   1. Diarrhea   2. Abdominal pain       MDM  12:46 PM Labs pending. Patient will have morphine and zofran for symptoms. Vitals stable and patient afebrile.   2:04 PM Labs unremarkable for acute changes. Urinalysis unremarkable for acute changes. Patient needs follow up with GI at this point. I will prescribe anti-diarrheal medication and patient will be discharged with "diet for diarrhea" instruction. Vitals stable and patient afebrile. Abdomen is non-tender. No further indication for imaging at this point. Patient denies recent antibiotics or recent foreign travel, leaving C. Diff and infectious diarrhea low on differential. Patient instructed to return with worsening or concerning symptoms.       Emilia Beck, PA-C 11/02/12 1211

## 2012-10-31 NOTE — ED Notes (Signed)
Pt states that she's been scheduled for colonoscopy 2 times and had to have them cancelled due to being inpt for COPD flare.  Pt states that everything she takes in orally goes straight through her system and comes out liquid.  Pt states she has stopped eating because she is sore from so much diarrhea.  This problem has been going on for at least a month.  Pt states if she eats anything she has diarrhea immediately after.  Pt states since she has not been eating she has diarrhea 2 - 3 times daily.  Pt alert and oriented and in NAD.

## 2012-11-02 NOTE — ED Provider Notes (Signed)
Medical screening examination/treatment/procedure(s) were performed by non-physician practitioner and as supervising physician I was immediately available for consultation/collaboration.   Thelonious Kauffmann III, MD 11/02/12 1222 

## 2012-11-26 ENCOUNTER — Encounter: Payer: Self-pay | Admitting: Internal Medicine

## 2012-12-03 ENCOUNTER — Ambulatory Visit (INDEPENDENT_AMBULATORY_CARE_PROVIDER_SITE_OTHER): Payer: Medicaid Other | Admitting: Internal Medicine

## 2012-12-03 ENCOUNTER — Encounter: Payer: Self-pay | Admitting: Internal Medicine

## 2012-12-03 ENCOUNTER — Other Ambulatory Visit: Payer: Self-pay | Admitting: Gastroenterology

## 2012-12-03 VITALS — BP 152/80 | HR 104 | Ht 65.0 in | Wt 206.0 lb

## 2012-12-03 DIAGNOSIS — K529 Noninfective gastroenteritis and colitis, unspecified: Secondary | ICD-10-CM

## 2012-12-03 DIAGNOSIS — R109 Unspecified abdominal pain: Secondary | ICD-10-CM

## 2012-12-03 DIAGNOSIS — R197 Diarrhea, unspecified: Secondary | ICD-10-CM

## 2012-12-03 DIAGNOSIS — K6289 Other specified diseases of anus and rectum: Secondary | ICD-10-CM

## 2012-12-03 MED ORDER — PEG-KCL-NACL-NASULF-NA ASC-C 100 G PO SOLR: 1.0000 | Freq: Once | ORAL | Status: DC

## 2012-12-03 NOTE — Patient Instructions (Addendum)
You have been scheduled for a colonoscopy with propofol. Please follow written instructions given to you at your visit today.  Please pick up your prep kit at the pharmacy within the next 1-3 days. If you use inhalers (even only as needed), please bring them with you on the day of your procedure. Your physician has requested that you go to www.[email or web address] and enter the access code given to you at your visit today. This web site gives a general overview about your procedure. However, you should still follow specific instructions given to you by our office regarding your preparation for the procedure.  Your physician has requested that you go to the basement for the following lab work before leaving today: Stool studies                                               We are excited to introduce MyChart, a new best-in-class service that provides you online access to important information in your electronic medical record. We want to make it easier for you to view your health information - all in one secure location - when and where you need it. We expect MyChart will enhance the quality of care and service we provide.  When you register for MyChart, you can:    View your test results.    Request appointments and receive appointment reminders via email.    Request medication renewals.    View your medical history, allergies, medications and immunizations.    Communicate with your physician's office through a password-protected site.    Conveniently print information such as your medication lists.  To find out if MyChart is right for you, please talk to a member of our clinical staff today. We will gladly answer your questions about this free health and wellness tool.  If you are age 56 or older and want a member of your family to have access to your record, you must provide written consent by completing a proxy form available at our office. Please speak to our clinical staff about guidelines  regarding accounts for patients younger than age 56.  As you activate your MyChart account and need any technical assistance, please call the MyChart technical support line at [phone]) or email your question to [email or web address]. If you email your question(s), please include your name, a return phone number and the best time to reach you.  If you have non-urgent health-related questions, you can send a message to our office through MyChart at [email or web address]. If you have a medical emergency, call 911.  Thank you for using MyChart as your new health and wellness resource!   MyChart licensed from [employer],  4540-9811. Patents Pending.

## 2012-12-03 NOTE — Progress Notes (Signed)
Patient ID: [name] [name], female   DOB: [birth year], 57 y.o.   MRN: [id number] HPI: [name] [name] is a 57 yo female with PMH of bipolar disorder, tobacco abuse, diabetes, COPD on nocturnal oxygen, hypothyroidism, tremor, hypertension who is seen in consultation at the request of Dr. Jeannetta Nap to evaluate chronic diarrhea. The patient reports she's had at least 6 months of diarrhea occurring on a daily basis. She has been using liquid Imodium 3 times a day over the last several months. Without the use of loperamide she reports 10-15 liquid stools per day. She endorses nocturnal stooling. She also reports perianal burning and pain. Occasionally she sees bright red blood per rectum on the toilet tissue. She reports at times Imodium can lead to more hard stool and this often makes passing the stools painful. She reports that she has reduced her diet and even avoided eating to avoid diarrhea. She has lost about 50 pounds in the last 6 months. No upper complaints including no nausea or vomiting. No trouble swallowing. No melena. She denies any change in her medications over the last 6-12 months.  She recalls one previous colonoscopy performed 8 years ago in [place].  She recalls multiple polyps being removed and she was advised to followup in 3-5 years. She avoided doing so because the sedation was incomplete and it was somewhat traumatic for her.  She is on oxygen via nasal cannula at bedtime, but not during the day.  Patient Active Problem List   Diagnosis Date Noted  . Gait instability 08/29/2012  . Tremor 08/29/2012  . Hyperlipidemia 07/08/2012  . Acute respiratory failure 07/08/2012  . Hypothyroidism 07/08/2012  . COPD exacerbation 04/13/2012  . Acute-on-chronic respiratory failure 04/13/2012  . Low back pain 04/13/2012  . Thrombocytopenia 04/13/2012  . Diabetes mellitus 04/13/2012  . Tobacco abuse 04/13/2012  . Hypertension   . Bipolar 1 disorder   . Insomnia   . Depression   .  Suicidal thoughts     Past Surgical History  Procedure Laterality Date  . Knee surgery      Left knee tendon surgery  . Finger surgery      middle left finger  . Hernia repair      x 3  . Appendectomy    . Abdominal hysterectomy    . Tubal ligation    . Colon surgery      "colon repair"    Current Outpatient Prescriptions  Medication Sig Dispense Refill  . albuterol (PROVENTIL) (5 MG/ML) 0.5% nebulizer solution Take 2.5 mg by nebulization every 6 (six) hours as needed for wheezing or shortness of breath.      Marland Kitchen amLODipine (NORVASC) 10 MG tablet Take 10 mg by mouth every morning.       . clonazePAM (KLONOPIN) 0.5 MG tablet Take 0.5 mg by mouth 2 (two) times daily as needed for anxiety.      . divalproex (DEPAKOTE ER) 500 MG 24 hr tablet Take 1,000 mg by mouth 2 (two) times daily.      Marland Kitchen gabapentin (NEURONTIN) 300 MG capsule Take 300 mg by mouth 3 (three) times daily.      Marland Kitchen levothyroxine (SYNTHROID, LEVOTHROID) 88 MCG tablet Take 88 mcg by mouth daily before breakfast.       . lisinopril (PRINIVIL,ZESTRIL) 10 MG tablet Take 10 mg by mouth every morning.      . loperamide (IMODIUM) 1 MG/5ML solution Take 1 mg by mouth 4 (four) times daily as needed for diarrhea or loose stools.      Marland Kitchen  metFORMIN (GLUCOPHAGE) 850 MG tablet Take 850 mg by mouth 2 (two) times daily with a meal.      . mirtazapine (REMERON) 15 MG tablet Take 15 mg by mouth at bedtime.      . sertraline (ZOLOFT) 100 MG tablet Take 100 mg by mouth 2 (two) times daily.       . simvastatin (ZOCOR) 40 MG tablet Take 40 mg by mouth at bedtime.       Marland Kitchen tiotropium (SPIRIVA) 18 MCG inhalation capsule Place 1 capsule (18 mcg total) into inhaler and inhale daily.  30 capsule  3  . peg 3350 powder (MOVIPREP) 100 G SOLR Take 1 kit (100 g total) by mouth once.  1 kit  0   No current facility-administered medications for this visit.    Allergies  Allergen Reactions  . Diclofenac Tinitus    swelling in feet  . Morphine And Related  Nausea And Vomiting  . Potassium-Containing Compounds Itching    Family History  Problem Relation Age of Onset  . Multiple sclerosis Mother   . Alcoholism Father   . Depression Brother     History  Substance Use Topics  . Smoking status: Former Smoker -- 1.00 packs/day for 42 years    Quit date: 08/21/2012  . Smokeless tobacco: Never Used     Comment: Quit since 1 week.  . Alcohol Use: No     Comment: H/O alcoholism, clean x 6 years.    ROS: As per history of present illness, otherwise negative  BP 152/80  Pulse 104  Ht 5\' 5"  (1.651 m)  Wt 206 lb (93.441 kg)  BMI 34.28 kg/m2 Constitutional: Well-developed and well-nourished. No distress. HEENT: Normocephalic and atraumatic. Oropharynx is clear and moist. No oropharyngeal exudate. Conjunctivae are normal.  No scleral icterus. Neck: Neck supple. Trachea midline. Cardiovascular: Normal rate, regular rhythm and intact distal pulses.  Pulmonary/chest: Effort normal and breath sounds normal. No wheezing, rales or rhonchi. Abdominal: Soft, mild diffuse tenderness, mild distended. Bowel sounds active throughout. Extremities: no clubbing, cyanosis, or edema Neurological: Alert and oriented to person place and time. Intention tremor Skin: Skin is warm and dry. No rashes noted. Psychiatric: Normal mood and affect. Behavior is normal.  RELEVANT LABS AND IMAGING: CBC    Component Value Date/Time   WBC 7.4 10/31/2012 1040   RBC 5.33* 10/31/2012 1040   HGB 15.4* 10/31/2012 1040   HCT 46.5* 10/31/2012 1040   PLT 121* 10/31/2012 1040   MCV 87.2 10/31/2012 1040   MCH 28.9 10/31/2012 1040   MCHC 33.1 10/31/2012 1040   RDW 14.4 10/31/2012 1040   LYMPHSABS 2.0 10/31/2012 1040   MONOABS 0.5 10/31/2012 1040   EOSABS 0.2 10/31/2012 1040   BASOSABS 0.0 10/31/2012 1040    CMP     Component Value Date/Time   NA 145 10/31/2012 1040   K 3.8 10/31/2012 1040   CL 105 10/31/2012 1040   CO2 28 10/31/2012 1040   GLUCOSE 93 10/31/2012 1040   BUN 14  10/31/2012 1040   CREATININE 0.38* 10/31/2012 1040   CALCIUM 9.8 10/31/2012 1040   PROT 7.5 10/31/2012 1040   ALBUMIN 4.0 10/31/2012 1040   AST 11 10/31/2012 1040   ALT 7 10/31/2012 1040   ALKPHOS 89 10/31/2012 1040   BILITOT 0.2* 10/31/2012 1040   GFRNONAA >90 10/31/2012 1040   GFRAA >90 10/31/2012 1040    ASSESSMENT/PLAN:  57 yo female with PMH of bipolar disorder, tobacco abuse, diabetes, COPD on nocturnal oxygen, hypothyroidism,  tremor, hypertension who is seen in consultation at the request of Dr. Jeannetta Nap to evaluate chronic diarrhea.  1.  Chronic diarrhea/intermittent rectal bleeding/hx of colon polyps -- ongoing chronic diarrhea.  I would like to start with stools studies and colonoscopy.  She needs colonoscopy for polyp surveillance and to evaluate diarrhea (r/o microscopic colitis).  Bleeding could be hemorrhoidal or fissure, but colonoscopy will help sort this out.  I will give her prescription for Anusol-HC to be used until the time of her colonoscopy. She can also continue using Imodium per box instructions to help control diarrhea until her procedure. I have asked her to avoid using Imodium on the day before submitting her stool studies. Her the recommendations after procedure. The procedure will be performed in the hospital setting due to her nocturnal use of oxygen with MAC

## 2012-12-04 ENCOUNTER — Encounter: Payer: Self-pay | Admitting: Nurse Practitioner

## 2012-12-04 ENCOUNTER — Ambulatory Visit (INDEPENDENT_AMBULATORY_CARE_PROVIDER_SITE_OTHER): Payer: Medicaid Other | Admitting: Nurse Practitioner

## 2012-12-04 VITALS — BP 146/89 | HR 110 | Ht 65.0 in | Wt 206.0 lb

## 2012-12-04 DIAGNOSIS — G43909 Migraine, unspecified, not intractable, without status migrainosus: Secondary | ICD-10-CM | POA: Insufficient documentation

## 2012-12-04 DIAGNOSIS — F1010 Alcohol abuse, uncomplicated: Secondary | ICD-10-CM | POA: Insufficient documentation

## 2012-12-04 NOTE — Progress Notes (Addendum)
Diagnosis: Migraine without Aura  History: Pt is in the office today for a migraine consult. She has not had many migraines in her life, but for the last year has been having them daily. She has been seeing her PCP Dr Jeannetta Nap who has given her Vic[place]. She has seen a Neurologist who gave her neck exercises . She has a very complicated medical and phychiatric history. This includes COPD and continues to smoke, alcohol addiction and recently restarted drinking alcohol, suicide thoughts and a recent suicide attempt with overdose of medications. She has also had a 50 lb weight loss and has a colonoscopy scheduled next Thursday. Her migraines are worse in the mornings and she thought they may have something to do with her O2. She may need a sleep study after her other testing.  Location: Right occipital, Right and Left Temple   Number of Headache days/month: Severe: daily Moderate:0 Mild:0  Current Outpatient Prescriptions on File Prior to Visit  Medication Sig Dispense Refill  . albuterol (PROVENTIL) (5 MG/ML) 0.5% nebulizer solution Take 2.5 mg by nebulization every 6 (six) hours as needed for wheezing or shortness of breath.      Marland Kitchen amLODipine (NORVASC) 10 MG tablet Take 10 mg by mouth every morning.       . clonazePAM (KLONOPIN) 0.5 MG tablet Take 0.5 mg by mouth 2 (two) times daily as needed for anxiety.      . divalproex (DEPAKOTE ER) 500 MG 24 hr tablet Take 1,000 mg by mouth 2 (two) times daily.      Marland Kitchen gabapentin (NEURONTIN) 300 MG capsule Take 300 mg by mouth 3 (three) times daily.      Marland Kitchen levothyroxine (SYNTHROID, LEVOTHROID) 88 MCG tablet Take 88 mcg by mouth daily before breakfast.       . lisinopril (PRINIVIL,ZESTRIL) 10 MG tablet Take 10 mg by mouth every morning.      . metFORMIN (GLUCOPHAGE) 850 MG tablet Take 850 mg by mouth 2 (two) times daily with a meal.      . sertraline (ZOLOFT) 100 MG tablet Take 100 mg by mouth 2 (two) times daily.       . simvastatin (ZOCOR) 40 MG tablet Take  40 mg by mouth at bedtime.       Marland Kitchen tiotropium (SPIRIVA) 18 MCG inhalation capsule Place 1 capsule (18 mcg total) into inhaler and inhale daily.  30 capsule  3  . peg 3350 powder (MOVIPREP) 100 G SOLR Take 1 kit (100 g total) by mouth once.  1 kit  0   No current facility-administered medications on file prior to visit.    Acute/ prevention: Tylenol up to 20 tablets per day, Depakote, Zoloft,    Past Medical History  Diagnosis Date  . Hypertension   . Bipolar 1 disorder   . Insomnia   . Depression   . Suicidal thoughts   . COPD (chronic obstructive pulmonary disease)   . Complication of anesthesia     Hx resp distress during surgery  . Diabetes mellitus   . Hypothyroidism   . Hyperlipidemia   . Polysubstance abuse Quit 2008     H/O methamphetamine and alcohol abuse, clean x 6 years  . Tobacco abuse   . Chronic diarrhea     Followed by Dr Bosie Clos   . Shortness of breath   . ZOXWRUEA(540.9)    Past Surgical History  Procedure Laterality Date  . Knee surgery      Left knee tendon surgery  . Finger  surgery      middle left finger  . Hernia repair      x 3  . Appendectomy    . Abdominal hysterectomy    . Tubal ligation    . Colon surgery      "colon repair"   Family History  Problem Relation Age of Onset  . Multiple sclerosis Mother   . Alcoholism Father   . Depression Brother    Social History:  reports that she quit smoking about 3 months ago. She has never used smokeless tobacco. She reports that she does not drink alcohol or use illicit drugs. Allergies:  Allergies  Allergen Reactions  . Diclofenac Tinitus    swelling in feet  . Morphine And Related Nausea And Vomiting  . Potassium-Containing Compounds Itching    Triggers: Stress  Birth control: Age  ROS: Anxiety, depression, COPD, HTN, Suicide ideation, Insomnia,   Exam: 57 YO Caucasian female  General: COPD HEENT: Negative Cardiac: RRR Lungs: Wheezes upper lobes bilaterally, Mid and lower lobes  decreased sounds Neuro: Not able to recall complete medical information, daughter had to be called into room Skin: cool, clammy  Procedure: Trigger Point Injections 10 cc / 2cc dexamethazone, 4 cc lidocaine, 4 cc marcaine were injected into right occipital, right temple, left temple in 3.5 cc each area with good relief of pain  Impression:rebound headache due to excessive medication use, chronic migraine  Plan: We discussed medication management and at this time considering all of her medical and psychiatric issues we will not make any changes until she sees her psychiatrist and has her colonoscopy. She was given Trigger Point Injections and asked to ice the areas and seemed to get very good relief. She was denied any narcotics and she seemed to under the reasons. She is strongly advised to limit her tylenol intake to the directions on the bottle only.    Time Spent: 45 min

## 2012-12-04 NOTE — Patient Instructions (Signed)
Migraine Headache A migraine headache is an intense, throbbing pain on one or both sides of your head. A migraine can last for 30 minutes to several hours. CAUSES  The exact cause of a migraine headache is not always known. However, a migraine may be caused when nerves in the brain become irritated and release chemicals that cause inflammation. This causes pain. SYMPTOMS  Pain on one or both sides of your head.  Pulsating or throbbing pain.  Severe pain that prevents daily activities.  Pain that is aggravated by any physical activity.  Nausea, vomiting, or both.  Dizziness.  Pain with exposure to bright lights, loud noises, or activity.  General sensitivity to bright lights, loud noises, or smells. Before you get a migraine, you may get warning signs that a migraine is coming (aura). An aura may include:  Seeing flashing lights.  Seeing bright spots, halos, or zig-zag lines.  Having tunnel vision or blurred vision.  Having feelings of numbness or tingling.  Having trouble talking.  Having muscle weakness. MIGRAINE TRIGGERS  Alcohol.  Smoking.  Stress.  Menstruation.  Aged cheeses.  Foods or drinks that contain nitrates, glutamate, aspartame, or tyramine.  Lack of sleep.  Chocolate.  Caffeine.  Hunger.  Physical exertion.  Fatigue.  Medicines used to treat chest pain (nitroglycerine), birth control pills, estrogen, and some blood pressure medicines. DIAGNOSIS  A migraine headache is often diagnosed based on:  Symptoms.  Physical examination.  A CT scan or MRI of your head. TREATMENT Medicines may be given for pain and nausea. Medicines can also be given to help prevent recurrent migraines.  HOME CARE INSTRUCTIONS  Only take over-the-counter or prescription medicines for pain or discomfort as directed by your caregiver. The use of long-term narcotics is not recommended.  Lie down in a dark, quiet room when you have a migraine.  Keep a journal  to find out what may trigger your migraine headaches. For example, write down:  What you eat and drink.  How much sleep you get.  Any change to your diet or medicines.  Limit alcohol consumption.  Quit smoking if you smoke.  Get 7 to 9 hours of sleep, or as recommended by your caregiver.  Limit stress.  Keep lights dim if bright lights bother you and make your migraines worse. SEEK IMMEDIATE MEDICAL CARE IF:   Your migraine becomes severe.  You have a fever.  You have a stiff neck.  You have vision loss.  You have muscular weakness or loss of muscle control.  You start losing your balance or have trouble walking.  You feel faint or pass out.  You have severe symptoms that are different from your first symptoms. MAKE SURE YOU:   Understand these instructions.  Will watch your condition.  Will get help right away if you are not doing well or get worse. Document Released: 05/09/2005 Document Revised: 08/01/2011 Document Reviewed: 04/29/2011 Exit[employer] Patient Information 2014 [employer].  

## 2012-12-05 ENCOUNTER — Encounter (HOSPITAL_COMMUNITY): Payer: Self-pay | Admitting: Pharmacy Technician

## 2012-12-05 ENCOUNTER — Encounter (HOSPITAL_COMMUNITY): Payer: Self-pay | Admitting: *Deleted

## 2012-12-06 ENCOUNTER — Other Ambulatory Visit: Payer: Medicaid Other

## 2012-12-06 ENCOUNTER — Other Ambulatory Visit: Payer: Self-pay | Admitting: Gastroenterology

## 2012-12-06 DIAGNOSIS — K529 Noninfective gastroenteritis and colitis, unspecified: Secondary | ICD-10-CM

## 2012-12-06 DIAGNOSIS — K625 Hemorrhage of anus and rectum: Secondary | ICD-10-CM

## 2012-12-06 DIAGNOSIS — R109 Unspecified abdominal pain: Secondary | ICD-10-CM

## 2012-12-07 LAB — OVA AND PARASITE SCREEN: OP: NONE SEEN

## 2012-12-10 ENCOUNTER — Telehealth: Payer: Self-pay | Admitting: Gastroenterology

## 2012-12-10 NOTE — Telephone Encounter (Signed)
Told pt stool study results came back neg. Pt states that is very good news. And to proceed with Colonoscopy at [facility] tomorrow. Pt verbalized understanding

## 2012-12-10 NOTE — Telephone Encounter (Signed)
Message copied by Richardo Hanks on Mon Dec 10, 2012  8:59 AM ------      Message from: Beverley Fiedler      Created: Mon Dec 10, 2012  8:16 AM       Stool studies neg to date, proceed to colon tomorrow as scheduled at [facility] ------

## 2012-12-11 ENCOUNTER — Telehealth: Payer: Self-pay | Admitting: Internal Medicine

## 2012-12-11 ENCOUNTER — Encounter (HOSPITAL_COMMUNITY): Payer: Self-pay | Admitting: Anesthesiology

## 2012-12-11 ENCOUNTER — Encounter (HOSPITAL_COMMUNITY)
Admission: RE | Disposition: A | Discharge status: Home / Self Care | Payer: Self-pay | Source: Ambulatory Visit | Attending: Internal Medicine

## 2012-12-11 ENCOUNTER — Other Ambulatory Visit: Payer: Self-pay | Admitting: Gastroenterology

## 2012-12-11 ENCOUNTER — Encounter (HOSPITAL_COMMUNITY): Payer: Self-pay | Admitting: *Deleted

## 2012-12-11 ENCOUNTER — Ambulatory Visit (HOSPITAL_COMMUNITY)
Admission: RE | Admit: 2012-12-11 | Discharge: 2012-12-11 | Disposition: A | Discharge status: Home / Self Care | Payer: Medicaid Other | Source: Ambulatory Visit | Attending: Internal Medicine | Admitting: Internal Medicine

## 2012-12-11 ENCOUNTER — Ambulatory Visit (HOSPITAL_COMMUNITY): Payer: Medicaid Other | Admitting: Anesthesiology

## 2012-12-11 DIAGNOSIS — R0902 Hypoxemia: Secondary | ICD-10-CM

## 2012-12-11 DIAGNOSIS — J4489 Other specified chronic obstructive pulmonary disease: Secondary | ICD-10-CM | POA: Insufficient documentation

## 2012-12-11 DIAGNOSIS — Z860100 Personal history of colon polyps, unspecified: Secondary | ICD-10-CM

## 2012-12-11 DIAGNOSIS — E039 Hypothyroidism, unspecified: Secondary | ICD-10-CM | POA: Insufficient documentation

## 2012-12-11 DIAGNOSIS — E785 Hyperlipidemia, unspecified: Secondary | ICD-10-CM | POA: Insufficient documentation

## 2012-12-11 DIAGNOSIS — D126 Benign neoplasm of colon, unspecified: Secondary | ICD-10-CM | POA: Insufficient documentation

## 2012-12-11 DIAGNOSIS — J441 Chronic obstructive pulmonary disease with (acute) exacerbation: Secondary | ICD-10-CM

## 2012-12-11 DIAGNOSIS — I10 Essential (primary) hypertension: Secondary | ICD-10-CM | POA: Insufficient documentation

## 2012-12-11 DIAGNOSIS — K625 Hemorrhage of anus and rectum: Secondary | ICD-10-CM | POA: Insufficient documentation

## 2012-12-11 DIAGNOSIS — E119 Type 2 diabetes mellitus without complications: Secondary | ICD-10-CM | POA: Insufficient documentation

## 2012-12-11 DIAGNOSIS — J449 Chronic obstructive pulmonary disease, unspecified: Secondary | ICD-10-CM | POA: Insufficient documentation

## 2012-12-11 DIAGNOSIS — Z79899 Other long term (current) drug therapy: Secondary | ICD-10-CM | POA: Insufficient documentation

## 2012-12-11 DIAGNOSIS — K648 Other hemorrhoids: Secondary | ICD-10-CM | POA: Insufficient documentation

## 2012-12-11 DIAGNOSIS — K529 Noninfective gastroenteritis and colitis, unspecified: Secondary | ICD-10-CM

## 2012-12-11 DIAGNOSIS — R197 Diarrhea, unspecified: Secondary | ICD-10-CM

## 2012-12-11 DIAGNOSIS — K644 Residual hemorrhoidal skin tags: Secondary | ICD-10-CM | POA: Insufficient documentation

## 2012-12-11 HISTORY — PX: COLONOSCOPY WITH PROPOFOL: SHX5780

## 2012-12-11 LAB — GLUCOSE, CAPILLARY: Glucose-Capillary: 103 mg/dL — ABNORMAL HIGH (ref 70–99)

## 2012-12-11 SURGERY — COLONOSCOPY WITH PROPOFOL
Anesthesia: Monitor Anesthesia Care

## 2012-12-11 MED ORDER — SODIUM CHLORIDE 0.9 % IV SOLN: INTRAVENOUS | Status: DC

## 2012-12-11 MED ORDER — LACTATED RINGERS IV SOLN
INTRAVENOUS | Status: DC | PRN
  Administered 2012-12-11: 07:00:00 via INTRAVENOUS

## 2012-12-11 MED ORDER — DEXAMETHASONE SODIUM PHOSPHATE 10 MG/ML IJ SOLN
INTRAMUSCULAR | Status: DC | PRN
  Administered 2012-12-11: 10 mg via INTRAVENOUS

## 2012-12-11 MED ORDER — MIDAZOLAM HCL 5 MG/5ML IJ SOLN
INTRAMUSCULAR | Status: DC | PRN
  Administered 2012-12-11: 1 mg via INTRAVENOUS

## 2012-12-11 MED ORDER — KETAMINE HCL 10 MG/ML IJ SOLN
INTRAMUSCULAR | Status: DC | PRN
  Administered 2012-12-11 (×2): 10 mg via INTRAVENOUS

## 2012-12-11 MED ORDER — PROPOFOL 10 MG/ML IV BOLUS
INTRAVENOUS | Status: DC | PRN
  Administered 2012-12-11 (×3): 20 mg via INTRAVENOUS
  Administered 2012-12-11: 10 mg via INTRAVENOUS
  Administered 2012-12-11 (×3): 20 mg via INTRAVENOUS

## 2012-12-11 MED ORDER — ACETAMINOPHEN 10 MG/ML IV SOLN
1000.0000 mg | Freq: Once | INTRAVENOUS | Status: DC
  Filled 2012-12-11: qty 100

## 2012-12-11 MED ORDER — ACETAMINOPHEN 500 MG PO TABS
1000.0000 mg | ORAL_TABLET | Freq: Once | ORAL | Status: AC
  Administered 2012-12-11: 1000 mg via ORAL
  Filled 2012-12-11: qty 2

## 2012-12-11 SURGICAL SUPPLY — 21 items

## 2012-12-11 NOTE — Telephone Encounter (Signed)
Noreene Larsson from [facility] called to report the pt's daughter will be calling. Spoke with daughter, [name] [name], who states pt has had 3 episodes of bleeding in toilet bowl and on the tissue and she had blood in the bed since she got home from her COLON. I asked for pt to go again while I was on the phone, and again she had blood in the bowl and on the tissue; she did not c/o dizziness. Pt did not have stools at any time with the bleeding. Spoke with Dr Rhea Belton who stated these were "cold" polyp removals and the bleeding should stop on its own. If the bleeding becomes more profuse, pt should go to the ER. She should probably have her HGB checked tomorrow. Called [name] [name] back who stated she is supposed to be at work at 5, but she doesn't want to leave her mom alone. I will write her a work excuse and wrote instructions for monitoring the pt and she will leave long enough to pick these up. I have asked her to call in the am with a report and she stated understanding.

## 2012-12-11 NOTE — Anesthesia Preprocedure Evaluation (Addendum)
Anesthesia Evaluation  Patient identified by MRN, date of birth, ID band Patient awake    Reviewed: Allergy & Precautions, H&P , NPO status , Patient's Chart, lab work & pertinent test results  History of Anesthesia Complications Negative for: history of anesthetic complications  Airway Mallampati: II TM Distance: >3 FB Neck ROM: Full    Dental  (+) Teeth Intact and Dental Advisory Given   Pulmonary neg pulmonary ROS, shortness of breath and with exertion, COPD COPD inhaler,  breath sounds clear to auscultation        Cardiovascular hypertension, Pt. on medications negative cardio ROS  Rhythm:Regular Rate:Normal     Neuro/Psych  Headaches, PSYCHIATRIC DISORDERS Depression    GI/Hepatic negative GI ROS, (+)     substance abuse   ,   Endo/Other  negative endocrine ROSdiabetes, Type 2, Oral Hypoglycemic AgentsHypothyroidism   Renal/GU negative Renal ROS     Musculoskeletal negative musculoskeletal ROS (+)   Abdominal (+) + obese,   Peds  Hematology negative hematology ROS (+)   Anesthesia Other Findings   Reproductive/Obstetrics negative OB ROS                          Anesthesia Physical Anesthesia Plan  ASA: III  Anesthesia Plan: MAC   Post-op Pain Management:    Induction: Intravenous  Airway Management Planned: Natural Airway and Simple Face Mask  Additional Equipment:   Intra-op Plan:   Post-operative Plan:   Informed Consent: I have reviewed the patients History and Physical, chart, labs and discussed the procedure including the risks, benefits and alternatives for the proposed anesthesia with the patient or authorized representative who has indicated his/her understanding and acceptance.   Dental advisory given  Plan Discussed with: CRNA  Anesthesia Plan Comments:         Anesthesia Quick Evaluation

## 2012-12-11 NOTE — H&P (View-Only) (Signed)
Patient ID: Lorene Berkley, female   DOB: 08/26/1955, 57 y.o.   MRN: [id number] HPI: Mrs. Grobe is a 57 yo female with PMH of bipolar disorder, tobacco abuse, diabetes, COPD on nocturnal oxygen, hypothyroidism, tremor, hypertension who is seen in consultation at the request of Dr. Elkins to evaluate chronic diarrhea. The patient reports she's had at least 6 months of diarrhea occurring on a daily basis. She has been using liquid Imodium 3 times a day over the last several months. Without the use of loperamide she reports 10-15 liquid stools per day. She endorses nocturnal stooling. She also reports perianal burning and pain. Occasionally she sees bright red blood per rectum on the toilet tissue. She reports at times Imodium can lead to more hard stool and this often makes passing the stools painful. She reports that she has reduced her diet and even avoided eating to avoid diarrhea. She has lost about 50 pounds in the last 6 months. No upper complaints including no nausea or vomiting. No trouble swallowing. No melena. She denies any change in her medications over the last 6-12 months.  She recalls one previous colonoscopy performed 8 years ago in [place].  She recalls multiple polyps being removed and she was advised to followup in 3-5 years. She avoided doing so because the sedation was incomplete and it was somewhat traumatic for her.  She is on oxygen via nasal cannula at bedtime, but not during the day.  Patient Active Problem List   Diagnosis Date Noted  . Gait instability 08/29/2012  . Tremor 08/29/2012  . Hyperlipidemia 07/08/2012  . Acute respiratory failure 07/08/2012  . Hypothyroidism 07/08/2012  . COPD exacerbation 04/13/2012  . Acute-on-chronic respiratory failure 04/13/2012  . Low back pain 04/13/2012  . Thrombocytopenia 04/13/2012  . Diabetes mellitus 04/13/2012  . Tobacco abuse 04/13/2012  . Hypertension   . Bipolar 1 disorder   . Insomnia   . Depression   .  Suicidal thoughts     Past Surgical History  Procedure Laterality Date  . Knee surgery      Left knee tendon surgery  . Finger surgery      middle left finger  . Hernia repair      x 3  . Appendectomy    . Abdominal hysterectomy    . Tubal ligation    . Colon surgery      "colon repair"    Current Outpatient Prescriptions  Medication Sig Dispense Refill  . albuterol (PROVENTIL) (5 MG/ML) 0.5% nebulizer solution Take 2.5 mg by nebulization every 6 (six) hours as needed for wheezing or shortness of breath.      . amLODipine (NORVASC) 10 MG tablet Take 10 mg by mouth every morning.       . clonazePAM (KLONOPIN) 0.5 MG tablet Take 0.5 mg by mouth 2 (two) times daily as needed for anxiety.      . divalproex (DEPAKOTE ER) 500 MG 24 hr tablet Take 1,000 mg by mouth 2 (two) times daily.      . gabapentin (NEURONTIN) 300 MG capsule Take 300 mg by mouth 3 (three) times daily.      . levothyroxine (SYNTHROID, LEVOTHROID) 88 MCG tablet Take 88 mcg by mouth daily before breakfast.       . lisinopril (PRINIVIL,ZESTRIL) 10 MG tablet Take 10 mg by mouth every morning.      . loperamide (IMODIUM) 1 MG/5ML solution Take 1 mg by mouth 4 (four) times daily as needed for diarrhea or loose stools.      .   metFORMIN (GLUCOPHAGE) 850 MG tablet Take 850 mg by mouth 2 (two) times daily with a meal.      . mirtazapine (REMERON) 15 MG tablet Take 15 mg by mouth at bedtime.      . sertraline (ZOLOFT) 100 MG tablet Take 100 mg by mouth 2 (two) times daily.       . simvastatin (ZOCOR) 40 MG tablet Take 40 mg by mouth at bedtime.       . tiotropium (SPIRIVA) 18 MCG inhalation capsule Place 1 capsule (18 mcg total) into inhaler and inhale daily.  30 capsule  3  . peg 3350 powder (MOVIPREP) 100 G SOLR Take 1 kit (100 g total) by mouth once.  1 kit  0   No current facility-administered medications for this visit.    Allergies  Allergen Reactions  . Diclofenac Tinitus    swelling in feet  . Morphine And Related  Nausea And Vomiting  . Potassium-Containing Compounds Itching    Family History  Problem Relation Age of Onset  . Multiple sclerosis Mother   . Alcoholism Father   . Depression Brother     History  Substance Use Topics  . Smoking status: Former Smoker -- 1.00 packs/day for 42 years    Quit date: 08/21/2012  . Smokeless tobacco: Never Used     Comment: Quit since 1 week.  . Alcohol Use: No     Comment: H/O alcoholism, clean x 6 years.    ROS: As per history of present illness, otherwise negative  BP 152/80  Pulse 104  Ht 5' 5" (1.651 m)  Wt 206 lb (93.441 kg)  BMI 34.28 kg/m2 Constitutional: Well-developed and well-nourished. No distress. HEENT: Normocephalic and atraumatic. Oropharynx is clear and moist. No oropharyngeal exudate. Conjunctivae are normal.  No scleral icterus. Neck: Neck supple. Trachea midline. Cardiovascular: Normal rate, regular rhythm and intact distal pulses.  Pulmonary/chest: Effort normal and breath sounds normal. No wheezing, rales or rhonchi. Abdominal: Soft, mild diffuse tenderness, mild distended. Bowel sounds active throughout. Extremities: no clubbing, cyanosis, or edema Neurological: Alert and oriented to person place and time. Intention tremor Skin: Skin is warm and dry. No rashes noted. Psychiatric: Normal mood and affect. Behavior is normal.  RELEVANT LABS AND IMAGING: CBC    Component Value Date/Time   WBC 7.4 10/31/2012 1040   RBC 5.33* 10/31/2012 1040   HGB 15.4* 10/31/2012 1040   HCT 46.5* 10/31/2012 1040   PLT 121* 10/31/2012 1040   MCV 87.2 10/31/2012 1040   MCH 28.9 10/31/2012 1040   MCHC 33.1 10/31/2012 1040   RDW 14.4 10/31/2012 1040   LYMPHSABS 2.0 10/31/2012 1040   MONOABS 0.5 10/31/2012 1040   EOSABS 0.2 10/31/2012 1040   BASOSABS 0.0 10/31/2012 1040    CMP     Component Value Date/Time   NA 145 10/31/2012 1040   K 3.8 10/31/2012 1040   CL 105 10/31/2012 1040   CO2 28 10/31/2012 1040   GLUCOSE 93 10/31/2012 1040   BUN 14  10/31/2012 1040   CREATININE 0.38* 10/31/2012 1040   CALCIUM 9.8 10/31/2012 1040   PROT 7.5 10/31/2012 1040   ALBUMIN 4.0 10/31/2012 1040   AST 11 10/31/2012 1040   ALT 7 10/31/2012 1040   ALKPHOS 89 10/31/2012 1040   BILITOT 0.2* 10/31/2012 1040   GFRNONAA >90 10/31/2012 1040   GFRAA >90 10/31/2012 1040    ASSESSMENT/PLAN:  57 yo female with PMH of bipolar disorder, tobacco abuse, diabetes, COPD on nocturnal oxygen, hypothyroidism,   tremor, hypertension who is seen in consultation at the request of Dr. Elkins to evaluate chronic diarrhea.  1.  Chronic diarrhea/intermittent rectal bleeding/hx of colon polyps -- ongoing chronic diarrhea.  I would like to start with stools studies and colonoscopy.  She needs colonoscopy for polyp surveillance and to evaluate diarrhea (r/o microscopic colitis).  Bleeding could be hemorrhoidal or fissure, but colonoscopy will help sort this out.  I will give her prescription for Anusol-HC to be used until the time of her colonoscopy. She can also continue using Imodium per box instructions to help control diarrhea until her procedure. I have asked her to avoid using Imodium on the day before submitting her stool studies. Her the recommendations after procedure. The procedure will be performed in the hospital setting due to her nocturnal use of oxygen with MAC    

## 2012-12-11 NOTE — Progress Notes (Signed)
Patient without home O2 tank for discharge. Patient uses [facility] for home oxygen use. [facility] notified and portable tank loaned to patient for discharge home. Patient's sats 88-89% on 4L which is baseline for patient. Patient will notify [facility] for pickup for portable tank.

## 2012-12-11 NOTE — Anesthesia Postprocedure Evaluation (Signed)
Anesthesia Post Note  Patient: [name] [name]  Procedure(s) Performed: Procedure(s) (LRB): COLONOSCOPY WITH PROPOFOL (N/A)  Anesthesia type: MAC  Patient location: PACU  Post pain: Pain level controlled  Post assessment: Post-op Vital signs reviewed  Last Vitals: BP 139/91  Pulse 95  Temp(Src) 36.8 C (Oral)  Resp 24  Ht 5\' 5"  (1.651 m)  Wt 206 lb 6.4 oz (93.622 kg)  BMI 34.35 kg/m2  SpO2 89%  Post vital signs: Reviewed  Level of consciousness: awake  Complications: No apparent anesthesia complications

## 2012-12-11 NOTE — Transfer of Care (Signed)
Immediate Anesthesia Transfer of Care Note  Patient: [name] [name]  Procedure(s) Performed: Procedure(s): COLONOSCOPY WITH PROPOFOL (N/A)  Patient Location: PACU and Endoscopy Unit  Anesthesia Type:MAC  Level of Consciousness: awake, alert , oriented and patient cooperative  Airway & Oxygen Therapy: Patient Spontanous Breathing and Patient connected to face mask oxygen  Post-op Assessment: Report given to PACU RN  Post vital signs: Reviewed and stable  Complications: No apparent anesthesia complications

## 2012-12-11 NOTE — Interval H&P Note (Signed)
History and Physical Interval Note: Patient presents for colonoscopy. She was seen recently in the clinic to evaluate chronic diarrhea, intermittent rectal bleeding. She also has a history of colon polyps with colonoscopy 8 years ago [place]. Stool studies were performed after that office visit which were negative. The nature of the procedure, as well as the risks, benefits, and alternatives were carefully and thoroughly reviewed with the patient. Ample time for discussion and questions allowed. The patient understood, was satisfied, and agreed to proceed.  If the colon is endoscopically normal I will plan biopsies to rule out microscopic colitis Further recommendations thereafter   12/11/2012 7:29 AM  [name] [name]  has presented today for surgery, with the diagnosis of on home O2,  chronic Diarrhea  The various methods of treatment have been discussed with the patient and family. After consideration of risks, benefits and other options for treatment, the patient has consented to  Procedure(s): COLONOSCOPY WITH PROPOFOL (N/A) as a surgical intervention .  The patient's history has been reviewed, patient examined, no change in status, stable for surgery.  I have reviewed the patient's chart and labs.  Questions were answered to the patient's satisfaction.     [name] [name] M

## 2012-12-11 NOTE — Preoperative (Signed)
Beta Blockers   Reason not to administer Beta Blockers:Not Applicable 

## 2012-12-11 NOTE — Op Note (Signed)
[facility] [address] [place], [address]   COLONOSCOPY PROCEDURE REPORT  PATIENT: [name], [name]  MR#: [id number] BIRTHDATE: 04-15-1956 , 57  yrs. old GENDER: Female ENDOSCOPIST: Beverley Fiedler, MD REFERRED XB:JYNWGN Jeannetta Nap, M.D. PROCEDURE DATE:  12/11/2012 PROCEDURE:   Colonoscopy with snare polypectomy, Colonoscopy with cold biopsy polypectomy, and Colonoscopy with biopsy ASA CLASS:   Class III INDICATIONS:chronic diarrhea, Patient's personal history of colon polyps, Last colonoscopy performed 8 yrs ago, and Rectal Bleeding.  MEDICATIONS: MAC sedation, administered by CRNA and See Anesthesia Report.  DESCRIPTION OF PROCEDURE:   After the risks benefits and alternatives of the procedure were thoroughly explained, informed consent was obtained.  A digital rectal exam revealed no rectal mass.   The Pentax Colonoscope [id number]  endoscope was introduced through the anus and advanced to the cecum, which was identified by both the appendix and ileocecal valve. No adverse events experienced.   The quality of the prep was Moviprep fair  The instrument was then slowly withdrawn as the colon was fully examined.     COLON FINDINGS: A sessile polyp measuring 5 mm in size was found at the cecum.  A polypectomy was performed with cold forceps.  The resection was complete and the polyp tissue was completely retrieved.   Two sessile polyps measuring 5-6 mm in size were found in the descending colon and rectosigmoid colon.  Polypectomy was performed using cold snare.  All resections were complete and all polyp tissue was completely retrieved.   Two sessile polyps measuring 3-4 mm in size were found in rectum seen upon the retroflexed view.  Polypectomy was performed with cold forceps. All resections were complete and all polyp tissue was completely retrieved.   The colonic mucosa otherwise appeared normal throughout the entire examined colon.  Multiple random  biopsies of the area were performed given history of diarrhea.   Retroflexed views revealed internal/external hemorrhoids.     The scope was withdrawn and the procedure completed.  COMPLICATIONS: There were no complications.  ENDOSCOPIC IMPRESSION: 1.   Sessile polyp measuring 5 mm in size was found at the cecum; polypectomy was performed with cold forceps 2.   Two sessile polyps measuring 5-6 mm in size were found in the descending colon and rectosigmoid colon; Polypectomy was performed using cold snare 3.   Two sessile polyps measuring 3-4 mm in size were found in rectum seen upon the retroflexed view; Polypectomy was performed with cold forceps 4.   The colonic mucosa appeared otherwise normal throughout the entire examined colon; multiple random biopsies of the area were performed 5.   Small internal and external hemorrhoids  RECOMMENDATIONS: 1.  Await pathology results 2.  Continue current medications 3.  Timing of repeat colonoscopy will be determined by pathology findings. 4.  You will receive a letter within 1-2 weeks with the results of your biopsy as well as final recommendations.  Please call my office if you have not received a letter after 3 weeks.   eSigned:  Beverley Fiedler, MD 12/11/2012 8:31 AM   cc: Windle Guard, MD and The Patient   PATIENT NAME:  [name], [name] MR#: [id number]

## 2012-12-11 NOTE — Telephone Encounter (Signed)
ER visit for profuse rectal bleeding or bleeding unabated Polypectomies were cold and will likely stop on their own, but would check CBC tomorrow

## 2012-12-12 ENCOUNTER — Encounter (HOSPITAL_COMMUNITY): Payer: Self-pay | Admitting: Internal Medicine

## 2012-12-12 ENCOUNTER — Telehealth: Payer: Self-pay | Admitting: Gastroenterology

## 2012-12-12 NOTE — Telephone Encounter (Signed)
lvm for pt telling her she has been referred to [facility] pulmonary; she has an appointment on 01/12/2013 @ 3:30pm. To call me back if she has any questions

## 2012-12-12 NOTE — Telephone Encounter (Signed)
lmom for pt to call back

## 2012-12-12 NOTE — Telephone Encounter (Signed)
lmom for pt to call back after her daughter, [name] [name] gave me the correct number.

## 2012-12-13 ENCOUNTER — Encounter: Payer: Self-pay | Admitting: Internal Medicine

## 2012-12-13 ENCOUNTER — Other Ambulatory Visit: Payer: Self-pay | Admitting: *Deleted

## 2012-12-13 NOTE — Telephone Encounter (Deleted)
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 I have placed him on VSL#3 probiotic for 2 weeks with Xifaxan 550 mg twice a day for 2 weeks and we'll let him try a Fod-Map Diet for IBS. He does not want to undergo a long and involved GI evaluation withl repeat colonoscopy at this time. However, if he worsens or does not improve, the next In his evaluation would be followup colonoscopy, random biopsies, also endoscopy with small bowel biopsy. Previous extensive stool evaluation for free fatty acids, O/P, pathogens, and celiac disease was all negative. Other considerations would be trial of pancreatic extracts, check stool for elastase-1 assay versus referral to a tertiary Medical Center. Long involved discussion with patient per his multiple symptoms today.  No diagnosis found. See telephone encounter

## 2012-12-13 NOTE — Telephone Encounter (Signed)
The note below was entered in error.

## 2012-12-13 NOTE — Telephone Encounter (Signed)
Mailed pt a letter about her f/u and possible drop in HGB post COLON along with her Path letter.

## 2012-12-13 NOTE — Telephone Encounter (Signed)
Notes Recorded by Beverley Fiedler, MD on 12/13/2012 at 8:47 AM Only one of the polyps was precancerous, for this reason would repeat screening colonoscopy in 5 years Random biopsies from the colon were negative for inflammation to explain diarrhea I would recommend CT scan of the abdomen and pelvis with contrast to further evaluate diarrhea and weight loss given negative colonoscopy If she needs labs before this procedure that can be ordered, and also celiac panel He can continue loperamide per box instructions and add Lomotil if necessary I am writing pt a 3rd letter to include the above notes and hopefully pt will call about the CT scan.

## 2012-12-13 NOTE — Telephone Encounter (Signed)
lmom for pt to return our call.

## 2012-12-14 LAB — PANCREATIC ELASTASE, FECAL: Pancreatic Elastase-1, Stool: >500 ug/g

## 2012-12-17 ENCOUNTER — Telehealth: Payer: Self-pay | Admitting: Emergency Medicine

## 2012-12-17 NOTE — Telephone Encounter (Signed)
Pt never called back.

## 2012-12-17 NOTE — Telephone Encounter (Signed)
I spoke with daughter. She wanted address for appt. She also wanted to know if this eval will help with her migraines. I advised her she is seeing [place] for pulmonary consult to discuss breathing issues, etc. She voiced her understanding and needed nothing further

## 2012-12-27 ENCOUNTER — Ambulatory Visit (INDEPENDENT_AMBULATORY_CARE_PROVIDER_SITE_OTHER): Payer: Medicaid Other | Admitting: Internal Medicine

## 2012-12-27 ENCOUNTER — Ambulatory Visit (INDEPENDENT_AMBULATORY_CARE_PROVIDER_SITE_OTHER)
Admission: RE | Admit: 2012-12-27 | Discharge: 2012-12-27 | Disposition: A | Discharge status: Home / Self Care | Payer: Medicaid Other | Source: Ambulatory Visit | Attending: Internal Medicine | Admitting: Internal Medicine

## 2012-12-27 ENCOUNTER — Encounter: Payer: Self-pay | Admitting: Internal Medicine

## 2012-12-27 ENCOUNTER — Other Ambulatory Visit (INDEPENDENT_AMBULATORY_CARE_PROVIDER_SITE_OTHER): Payer: Medicaid Other

## 2012-12-27 VITALS — BP 112/76 | HR 105 | Temp 98.5°F | Ht 65.0 in | Wt 201.6 lb

## 2012-12-27 DIAGNOSIS — J4489 Other specified chronic obstructive pulmonary disease: Secondary | ICD-10-CM

## 2012-12-27 DIAGNOSIS — I10 Essential (primary) hypertension: Secondary | ICD-10-CM

## 2012-12-27 DIAGNOSIS — E039 Hypothyroidism, unspecified: Secondary | ICD-10-CM

## 2012-12-27 DIAGNOSIS — J449 Chronic obstructive pulmonary disease, unspecified: Secondary | ICD-10-CM

## 2012-12-27 DIAGNOSIS — F17200 Nicotine dependence, unspecified, uncomplicated: Secondary | ICD-10-CM

## 2012-12-27 DIAGNOSIS — J9610 Chronic respiratory failure, unspecified whether with hypoxia or hypercapnia: Secondary | ICD-10-CM

## 2012-12-27 DIAGNOSIS — Z720 Tobacco use: Secondary | ICD-10-CM

## 2012-12-27 LAB — CBC WITH DIFFERENTIAL/PLATELET
Basophils Relative: 0.5 % (ref 0.0–3.0)
Eosinophils Relative: 2.4 % (ref 0.0–5.0)
MCV: 83.9 fl (ref 78.0–100.0)
Monocytes Relative: 4.6 % (ref 3.0–12.0)
Neutrophils Relative %: 73.9 % (ref 43.0–77.0)
Platelets: 190 10*3/uL (ref 150.0–400.0)
RBC: 5.13 Mil/uL — ABNORMAL HIGH (ref 3.87–5.11)
WBC: 10.3 10*3/uL (ref 4.5–10.5)

## 2012-12-27 LAB — TSH: TSH: 0.47 u[IU]/mL (ref 0.35–5.50)

## 2012-12-27 LAB — BASIC METABOLIC PANEL
BUN: 10 mg/dL (ref 6–23)
Chloride: 100 mEq/L (ref 96–112)
Creatinine, Ser: 0.4 mg/dL (ref 0.4–1.2)
GFR: 180.43 mL/min (ref >60.00)
Potassium: 4.4 mEq/L (ref 3.5–5.1)

## 2012-12-27 MED ORDER — IRBESARTAN 150 MG PO TABS: 150.0000 mg | ORAL_TABLET | Freq: Every day | ORAL | Status: DC

## 2012-12-27 MED ORDER — PREDNISONE (PAK) 10 MG PO TABS: ORAL_TABLET | ORAL | Status: DC

## 2012-12-27 NOTE — Assessment & Plan Note (Signed)

## 2012-12-27 NOTE — Assessment & Plan Note (Addendum)
Elevated hco3 to 38 c/w chronic hypercarbia  Am ha's suggest either co2 buildup or lack of 02 or both> should titrate to sats upper 80s and lower 90's only

## 2012-12-27 NOTE — Assessment & Plan Note (Addendum)
DDX of  difficult airways managment all start with A and  include Adherence, Ace Inhibitors, Acid Reflux, Active Sinus Disease, Alpha 1 Antitripsin deficiency, Anxiety masquerading as Airways dz,  ABPA,  allergy(esp in young), Aspiration (esp in elderly), Adverse effects of DPI,  Active smokers, plus two Bs  = Bronchiectasis and Beta blocker use..and one C= CHF   Adherence is always the initial "prime suspect" and is a multilayered concern that requires a "trust but verify" approach in every patient - starting with knowing how to use medications, especially inhalers, correctly, keeping up with refills and understanding the fundamental difference between maintenance and prns vs those medications only taken for a very short course and then stopped and not refilled.   The proper method of use, as well as anticipated side effects, of a metered-dose inhaler are discussed and demonstrated to the patient. Improved effectiveness after extensive coaching during this visit to a level of approximately  75% but 100% with dpi  ACEi effect > needs trial off > see hbp  ? Allergies/ asthmatic component > try pred x 6 d only    Each maintenance medication was reviewed in detail including most importantly the difference between maintenance and as needed and under what circumstances the prns are to be used.  Please see instructions for details which were reviewed in writing and the patient given a copy.

## 2012-12-27 NOTE — Patient Instructions (Addendum)
The key is to stop smoking completely before smoking completely stops you!  Stop lisinopril Start avapro 150 mg one daily  Prednisone 10 mg take  4 each am x 2 days,   2 each am x 2 days,  1 each am x 2 days and stop Continue spiriva one daily Only use your albuterol (proaire 1st, the albtuerol neb second) as a rescue medication to be used if you can't catch your breath by resting or doing a relaxed purse lip breathing pattern. The less you use it, the better it will work when you need it. Ok to use up to every 4 hours   Work on inhaler technique:  relax and gently blow all the way out then take a nice smooth deep breath back in, triggering the inhaler at same time you start breathing in.  Hold for up to 5 seconds if you can.  Rinse and gargle with water when done  Please remember to go to the lab and x-ray department downstairs for your tests - we will call you with the results when they are available.    See Tammy NP w/in 2 weeks with all your medications, even over the counter meds, separated in two separate bags, the ones you take no matter what vs the ones you stop once you feel better and take only as needed when you feel you need them.   Tammy  will generate for you a new user friendly medication calendar that will put [place] all on the same page re: your medication use.     Without this process, it simply isn't possible to assure that we are providing  your outpatient care  with  the attention to detail we feel you deserve.   If we cannot assure that you're getting that kind of care,  then we cannot manage your problem effectively from this clinic.  Once you have seen Tammy and we are sure that we're all on the same page with your medication use she will arrange follow up with me.

## 2012-12-27 NOTE — Progress Notes (Addendum)
  Subjective:    Patient ID: [name] [name], female    DOB: 1955/07/04   MRN: [id number]  HPI  46 yowf active smoker with onset of sob around 2009 eval in [place] better on inhalers but moved to [place] in 2013 already on spiriva, flovent and placed on 02 by [employer] and followed by [employer].   12/27/2012 1st pulmonary/ [facility] on ACEi cc indolent onset progressive doe x 5 y to point where has to stop walking across parking lot even on 02 . Wears 02 18-19 h per day. Has been told hgb is high in past,  HA worse first thing in am assoc with nausea x sev months.  eval by ha specialist [place] > shots in head > no help.  Rattle cough> non prod.  Some better p saba, worse since out of spiriva x 2 weeks  No obvious daytime variabilty or assoc excess or purulent mucus production, cp or chest tightness, subjective wheeze overt sinus or hb symptoms. No unusual exp hx or h/o childhood pna/ asthma or knowledge of premature birth.     Also denies any obvious fluctuation of symptoms with weather or environmental changes or other aggravating or alleviating factors except as outlined above    Review of Systems  Constitutional: Negative for fever, chills and unexpected weight change.  HENT: Positive for rhinorrhea, sneezing and sinus pressure. Negative for ear pain, nosebleeds, congestion, sore throat, trouble swallowing, dental problem, voice change and postnasal drip.   Eyes: Negative for visual disturbance.  Respiratory: Positive for cough and shortness of breath. Negative for choking.   Cardiovascular: Negative for chest pain and leg swelling.  Gastrointestinal: Negative for vomiting, abdominal pain and diarrhea.  Genitourinary: Negative for difficulty urinating.  Musculoskeletal: Negative for arthralgias.  Skin: Negative for rash.  Neurological: Positive for headaches. Negative for tremors and syncope.  Hematological: Does not bruise/bleed easily.       Objective:   Physical  Exam  amb wf nad  Wt Readings from Last 3 Encounters:  12/27/12 201 lb 9.6 oz (91.445 kg)  12/05/12 206 lb 6.4 oz (93.622 kg)  12/05/12 206 lb 6.4 oz (93.622 kg)     HEENT mild turbinate edema.  Oropharynx no thrush or excess pnd or cobblestoning.  No JVD or cervical adenopathy. Mild accessory muscle hypertrophy. Trachea midline, nl thryroid. Chest was hyperinflated by percussion with diminished breath sounds and moderate increased exp time without wheeze. Hoover sign positive at mid inspiration. Regular rate and rhythm without murmur gallop or rub or increase P2 or edema.  Abd: no hsm, nl excursion. Ext warm without cyanosis or clubbing.      CXR  12/27/2012 :  Mild interstitial prominence bilaterally without convincing pulmonary edema. Bilateral midlung there is linear atelectasis or early infiltrate.      Assessment & Plan:

## 2012-12-27 NOTE — Assessment & Plan Note (Signed)
ACE inhibitors are problematic in  pts with airway complaints because  even experienced pulmonologists can't always distinguish ace effects from copd/asthma.  By themselves they don't actually cause a problem, much like oxygen can't by itself start a fire, but they certainly serve as a powerful catalyst or enhancer for any "fire"  or inflammatory process in the upper airway, be it caused by an ET  tube or more commonly reflux (especially in the obese or pts with known GERD or who are on biphoshonates).    In the era of ARB near equivalency until we have a better handle on the reversibility of the airway problem, it just makes sense to avoid ACEI  entirely in the short run and then decide later, having established a level of airway control using a reasonable limited regimen, whether to add back ace but even then being very careful to observe the pt for worsening airway control and number of meds used/ needed to control symptoms.    Try stop lisinopril and start avapro 150 mg daily instead

## 2012-12-28 ENCOUNTER — Telehealth: Payer: Self-pay | Admitting: Internal Medicine

## 2012-12-28 NOTE — Progress Notes (Signed)
Quick Note:  Spoke with pt and notified of results per Dr. Wert. Pt verbalized understanding and denied any questions.  ______ 

## 2012-12-28 NOTE — Telephone Encounter (Signed)
Spoke with the pt and notified of results of labs and cxr per MW She verbalized understanding and states nothing further needed

## 2012-12-31 ENCOUNTER — Telehealth: Payer: Self-pay | Admitting: Internal Medicine

## 2012-12-31 DIAGNOSIS — J9620 Acute and chronic respiratory failure, unspecified whether with hypoxia or hypercapnia: Secondary | ICD-10-CM

## 2012-12-31 NOTE — Telephone Encounter (Signed)
Order placed as below.  Will sign off.

## 2012-12-31 NOTE — Telephone Encounter (Signed)
Need order for this thanks Tobe Sos

## 2012-12-31 NOTE — Telephone Encounter (Signed)
I spoke with [name] [name] and she stated the last thing they have for her is 2 liters O2. They will need an updated O2 RX for her and also stating the 5 liters. She stated once this is done then they can set up ONO. Please advise O2 RX Dr. Sherene Sires thanks

## 2012-12-31 NOTE — Telephone Encounter (Signed)
Ok to order her for 5lpm though what I really wanted was to titrate to sat around 90 and hold it there and then do ono on whatever that setting was  If they cant do that then just make in 5lpm 24/7 and do the ono on 5lpm

## 2012-12-31 NOTE — Telephone Encounter (Signed)
Order was sent to [facility] 12/27/12. I have sent a staff message to [facility] to check on this

## 2013-01-01 ENCOUNTER — Encounter: Payer: Medicaid Other | Admitting: Nurse Practitioner

## 2013-01-01 ENCOUNTER — Telehealth: Payer: Self-pay | Admitting: Internal Medicine

## 2013-01-01 NOTE — Telephone Encounter (Signed)
Staff message was sent to [facility] yesterday afternoon. Will send another staff message to ensure this gets taken care of. I called and made daughter aware of this. Nothing further needed

## 2013-01-04 ENCOUNTER — Other Ambulatory Visit: Payer: Self-pay | Admitting: Family Medicine

## 2013-01-10 ENCOUNTER — Institutional Professional Consult (permissible substitution): Payer: Medicaid Other | Admitting: Emergency Medicine

## 2013-01-11 ENCOUNTER — Encounter: Payer: Medicaid Other | Admitting: Internal Medicine

## 2013-01-17 ENCOUNTER — Encounter: Payer: Self-pay | Admitting: Internal Medicine

## 2013-01-22 ENCOUNTER — Telehealth: Payer: Self-pay | Admitting: *Deleted

## 2013-01-22 NOTE — Telephone Encounter (Signed)
LMTCB for the pt 

## 2013-01-22 NOTE — Telephone Encounter (Signed)
Message copied by Christen Butter on Tue Jan 22, 2013 11:52 AM ------      Message from: Sandrea Hughs B      Created: Thu Jan 17, 2013  2:56 PM       Need to be sure she has appt to [facility] NP for med rec and discussion of bipap trial as her noct 02 is not adequate even on 9lpm ------

## 2013-01-23 NOTE — Telephone Encounter (Signed)
LMTCB x2  

## 2013-01-31 ENCOUNTER — Encounter: Payer: Self-pay | Admitting: Internal Medicine

## 2013-01-31 NOTE — Telephone Encounter (Signed)
LMTCB and will mail a letter

## 2013-02-20 ENCOUNTER — Emergency Department (HOSPITAL_COMMUNITY): Payer: Medicaid Other

## 2013-02-20 ENCOUNTER — Emergency Department (HOSPITAL_COMMUNITY)
Admission: EM | Admit: 2013-02-20 | Discharge: 2013-02-20 | Disposition: A | Discharge status: Home / Self Care | Payer: Medicaid Other | Attending: Emergency Medicine | Admitting: Emergency Medicine

## 2013-02-20 ENCOUNTER — Emergency Department (HOSPITAL_COMMUNITY)
Admission: EM | Admit: 2013-02-20 | Discharge: 2013-02-20 | Disposition: A | Discharge status: Other Acute Inpatient Hospital | Payer: Medicaid Other | Source: Home / Self Care | Attending: Emergency Medicine | Admitting: Emergency Medicine

## 2013-02-20 ENCOUNTER — Encounter (HOSPITAL_COMMUNITY): Payer: Self-pay | Admitting: Emergency Medicine

## 2013-02-20 ENCOUNTER — Encounter (HOSPITAL_COMMUNITY): Payer: Self-pay | Admitting: *Deleted

## 2013-02-20 DIAGNOSIS — E119 Type 2 diabetes mellitus without complications: Secondary | ICD-10-CM | POA: Insufficient documentation

## 2013-02-20 DIAGNOSIS — J440 Chronic obstructive pulmonary disease with acute lower respiratory infection: Secondary | ICD-10-CM | POA: Insufficient documentation

## 2013-02-20 DIAGNOSIS — R071 Chest pain on breathing: Secondary | ICD-10-CM

## 2013-02-20 DIAGNOSIS — J449 Chronic obstructive pulmonary disease, unspecified: Secondary | ICD-10-CM

## 2013-02-20 DIAGNOSIS — I10 Essential (primary) hypertension: Secondary | ICD-10-CM | POA: Insufficient documentation

## 2013-02-20 DIAGNOSIS — F17200 Nicotine dependence, unspecified, uncomplicated: Secondary | ICD-10-CM | POA: Insufficient documentation

## 2013-02-20 DIAGNOSIS — J40 Bronchitis, not specified as acute or chronic: Secondary | ICD-10-CM

## 2013-02-20 DIAGNOSIS — Z79899 Other long term (current) drug therapy: Secondary | ICD-10-CM | POA: Insufficient documentation

## 2013-02-20 DIAGNOSIS — J209 Acute bronchitis, unspecified: Secondary | ICD-10-CM | POA: Insufficient documentation

## 2013-02-20 DIAGNOSIS — E785 Hyperlipidemia, unspecified: Secondary | ICD-10-CM | POA: Insufficient documentation

## 2013-02-20 DIAGNOSIS — G4700 Insomnia, unspecified: Secondary | ICD-10-CM | POA: Insufficient documentation

## 2013-02-20 DIAGNOSIS — F319 Bipolar disorder, unspecified: Secondary | ICD-10-CM | POA: Insufficient documentation

## 2013-02-20 DIAGNOSIS — R079 Chest pain, unspecified: Secondary | ICD-10-CM | POA: Insufficient documentation

## 2013-02-20 DIAGNOSIS — E039 Hypothyroidism, unspecified: Secondary | ICD-10-CM | POA: Insufficient documentation

## 2013-02-20 DIAGNOSIS — R0781 Pleurodynia: Secondary | ICD-10-CM

## 2013-02-20 LAB — CBC
MCH: 25.7 pg — ABNORMAL LOW (ref 26.0–34.0)
MCHC: 29.9 g/dL — ABNORMAL LOW (ref 30.0–36.0)
MCV: 86 fL (ref 78.0–100.0)
Platelets: 97 10*3/uL — ABNORMAL LOW (ref 150–400)
RBC: 5.21 MIL/uL — ABNORMAL HIGH (ref 3.87–5.11)
RDW: 15.8 % — ABNORMAL HIGH (ref 11.5–15.5)

## 2013-02-20 LAB — POCT I-STAT TROPONIN I: Troponin i, poc: 0 ng/mL (ref 0.00–0.08)

## 2013-02-20 LAB — BASIC METABOLIC PANEL
Calcium: 9.2 mg/dL (ref 8.4–10.5)
Creatinine, Ser: 0.36 mg/dL — ABNORMAL LOW (ref 0.50–1.10)
GFR calc Af Amer: >90 mL/min (ref >90)
GFR calc non Af Amer: >90 mL/min (ref >90)
Sodium: 144 mEq/L (ref 135–145)

## 2013-02-20 MED ORDER — IBUPROFEN 600 MG PO TABS: 600.0000 mg | ORAL_TABLET | Freq: Three times a day (TID) | ORAL | Status: DC | PRN

## 2013-02-20 MED ORDER — SODIUM CHLORIDE 0.9 % IV SOLN: INTRAVENOUS | Status: DC

## 2013-02-20 MED ORDER — FENTANYL CITRATE 0.05 MG/ML IJ SOLN
50.0000 ug | Freq: Once | INTRAMUSCULAR | Status: AC
  Administered 2013-02-20: 50 ug via INTRAVENOUS
  Filled 2013-02-20: qty 2

## 2013-02-20 MED ORDER — OXYCODONE-ACETAMINOPHEN 5-325 MG PO TABS: 1.0000 | ORAL_TABLET | ORAL | Status: DC | PRN

## 2013-02-20 MED ORDER — AZITHROMYCIN 250 MG PO TABS: ORAL_TABLET | ORAL | Status: DC

## 2013-02-20 NOTE — ED Notes (Signed)
Per EMS - pt woke up with CP at 2am this morning, has hx of COPD, sating at 88% on 4 liters at urgent care. Pt being transferred for further eval. Pt wears oxygen at home, 9 liters/min all day and night. BP 144/98 HR 80. EMS started an IV in left hand.

## 2013-02-20 NOTE — ED Notes (Signed)
[name] [name] tried  Iv  tx  2     Saline  Lok  22  Started  l  Hand    1  Att

## 2013-02-20 NOTE — ED Notes (Signed)
Pt c/o pain to left breast/chest that started this morning at 2am, was sleeping when it started, woke her up from sleep. sts when she takes a deep breath the pain increases. Pt denies increased SOB, sts she is normally sob d/t her COPD, wears 9 liters at home 24/7, sob does increase with walking. Pt in nad, skin warm and dry, resp e/u.

## 2013-02-20 NOTE — ED Provider Notes (Signed)
Chief Complaint:   Chief Complaint  Patient presents with  . Chest Pain    History of Present Illness:   [name] [name] is is a 57 year old female with COPD who was awakened from a sound sleep this morning at 3 AM with 5-6/10 chest pain in the left breast area which radiates to the back. The pain was pleuritic, worse with deep inspiration, also worse with bending forward and better when lying flat. The patient broke out in a sweat and felt tremulous. She's had a cough productive of some green sputum, palpitations, and feels dizzy. She feels no more short of breath than usual. She is normally on oxygen at 9 L per minute via nasal cannula. She denies any fever, URI symptoms, wheezing, syncope, ankle edema, leg swelling or pain, or GI symptoms. She has no cardiac history. She has no history of thrombophlebitis or pulmonary embolism.  Review of Systems:  Other than noted above, the patient denies any of the following symptoms. Systemic:  No fever, chills, sweats, or fatigue. ENT:  No nasal congestion, rhinorrhea, or sore throat. Pulmonary:  No cough, wheezing, shortness of breath, sputum production, hemoptysis. Cardiac:  No palpitations, rapid heartbeat, dizziness, presyncope or syncope. GI:  No abdominal pain, heartburn, nausea, or vomiting. Ext:  No leg pain or swelling.  PMFSH:  Past medical history, family history, social history, meds, and allergies were reviewed and updated as needed. She is allergic to diclofenac and morphine. She also has listed an allergy to potassium, but I am not certain how this could be possible. She has a history of high blood pressure, bipolar disorder, insomnia, depression, COPD, diabetes, hypothyroidism, hyperlipidemia, polysubstance abuse, and tobacco abuse. She continues to smoke cigarettes despite COPD. Current medications include albuterol, Norvasc, Depakote, Neurontin, Advil, Avapro, Synthroid, metformin, sertraline, simvastatin, Spiriva, and Ambien.  Physical  Exam:   Vital signs:  BP 171/68  Pulse 104  Temp(Src) 98.1 F (36.7 C) (Oral)  Resp 16  SpO2 93% Gen:  Alert, oriented, in no distress, skin warm and dry. Eye:  PERRL, lids and conjunctivas normal.  Sclera non-icteric. ENT:  Mucous membranes moist, pharynx clear. Neck:  Supple, no adenopathy or tenderness.  No JVD. Lungs:  Clear to auscultation, no wheezes, rales or rhonchi.  No respiratory distress. Heart:  Regular rhythm.  No gallops, murmers, clicks or rubs. Chest:  No chest wall tenderness. Abdomen:  Soft, nontender, no organomegaly or mass.  Bowel sounds normal.  No pulsatile abdominal mass or bruit. Ext:  No edema.  No calf tenderness and Homann's sign negative.  Pulses full and equal. Skin:  Warm and dry.  No rash.  EKG:   Date: 02/20/2013  Rate: 96  Rhythm: normal sinus rhythm  QRS Axis: normal  Intervals: normal  ST/T Wave abnormalities: normal  Conduction Disutrbances:none  Narrative Interpretation: Normal sinus rhythm, normal EKG.  Old EKG Reviewed: none available  Course in Urgent Care Center:   Was begun on oxygen via nasal cannula, monitored, and IV normal saline was started.  Assessment:  The primary encounter diagnosis was Pleuritic chest pain. A diagnosis of COPD (chronic obstructive pulmonary disease) was also pertinent to this visit.  Differential diagnosis includes pulmonary embolism, pericarditis, pneumothorax, or coronary artery disease.  Plan:   The patient was transferred to the ED via [facility] in stable condition.  Medical Decision Making:  57 year old female with COPD was awakened at 3 a.m. Today  With left mammary, pleuritic chest pain radiating to back.  Associated with sweats,  tremors, productive cough, palpitations, and dizziness.  No shortness of breath (more so than usual) or nausea.  Her EKG was normal. We will transfer to the ED via [facility].       Reuben Likes, MD 02/20/13 [phone]

## 2013-02-20 NOTE — ED Notes (Signed)
Iv  Started  By  ems

## 2013-02-20 NOTE — ED Notes (Signed)
Pt  Reports  l    Sided  Chest  Pain  Which  Is  Worse    When  Takes  Deep  Breath or  Bends  foreward              Pt  Takes  Oxygen  At  Home

## 2013-02-20 NOTE — ED Provider Notes (Signed)
CSN: [id number]     Arrival date & time 02/20/13  1028 History   First MD Initiated Contact with Patient 02/20/13 1107     No chief complaint on file.  (Consider location/radiation/quality/duration/timing/severity/associated sxs/prior Treatment) HPI Comments: 57 year old female presents with about 9 hours of acute left-sided chest pain. She states that woke her up from sleep at 2:30 AM. She does as a sharp pain. She is unable to take full breaths to 2 the pain. It is exacerbated by coughing. She has had an increased cough with change in sputum to green the last couple days. She denies any new shortness of breath. She's not had these her albuterol more than normal. Her oxygen is at its baseline. Besides the chest pain she denies any other symptoms including abdominal pain or nausea or vomiting. The pain does worsen with any type of movement. The best she can do for her pain is to stay still. She denies any history of blood clots, hemoptysis, recent surgery, or recent travel. She's not on estrogen.   Past Medical History  Diagnosis Date  . Hypertension   . Bipolar 1 disorder   . Insomnia   . Depression   . Suicidal thoughts   . COPD (chronic obstructive pulmonary disease)   . Complication of anesthesia     Hx resp distress during surgery  . Diabetes mellitus   . Hypothyroidism   . Hyperlipidemia   . Polysubstance abuse Quit 2008     H/O methamphetamine and alcohol abuse, clean x 6 years  . Tobacco abuse   . Chronic diarrhea     Followed by Dr Bosie Clos   . Shortness of breath   . [id number](478.2)    Past Surgical History  Procedure Laterality Date  . Knee surgery      Left knee tendon surgery  . Finger surgery      middle left finger  . Hernia repair      x 3  . Appendectomy    . Abdominal hysterectomy    . Tubal ligation    . Colon surgery      "colon repair"  . Hemorrhoid surgery    . Colonoscopy with propofol N/A 12/11/2012    Procedure: COLONOSCOPY WITH PROPOFOL;  Surgeon:  Beverley Fiedler, MD;  Location: [facility];  Service: Gastroenterology;  Laterality: N/A;   Family History  Problem Relation Age of Onset  . Multiple sclerosis Mother   . Alcoholism Father   . Depression Brother   . Emphysema Maternal Grandfather     smoked   History  Substance Use Topics  . Smoking status: Current Every Day Smoker -- 1.00 packs/day for 42 years    Types: Cigarettes  . Smokeless tobacco: Never Used  . Alcohol Use: Yes     Comment: 1-2 drinks per day   OB History   Grav Para Term Preterm Abortions TAB SAB Ect Mult Living                 Review of Systems  Constitutional: Negative for fever.  Respiratory: Positive for cough. Negative for shortness of breath.   Cardiovascular: Positive for chest pain. Negative for leg swelling.  Gastrointestinal: Negative for vomiting and abdominal pain.  All other systems reviewed and are negative.    Allergies  Diclofenac; Morphine and related; and Potassium-containing compounds  Home Medications   Current Outpatient Rx  Name  Route  Sig  Dispense  Refill  . albuterol (PROVENTIL) (5 MG/ML) 0.5% nebulizer solution  Nebulization   Take 2.5 mg by nebulization every 6 (six) hours as needed for wheezing or shortness of breath.         Marland Kitchen amLODipine (NORVASC) 10 MG tablet   Oral   Take 5 mg by mouth every morning. Takes 1/2 tablet         . Aspirin-Acetaminophen-Caffeine 260-130-16 MG TABS   Oral   Take 1 packet by mouth daily as needed (for Headache).          Marland Kitchen atorvastatin (LIPITOR) 40 MG tablet   Oral   Take 40 mg by mouth daily.         . divalproex (DEPAKOTE ER) 500 MG 24 hr tablet   Oral   Take 1,000 mg by mouth 2 (two) times daily.         Marland Kitchen gabapentin (NEURONTIN) 300 MG capsule   Oral   Take 300 mg by mouth 3 (three) times daily.         Marland Kitchen ibuprofen (ADVIL,MOTRIN) 200 MG tablet   Oral   Take 800 mg by mouth every 4 (four) hours as needed for pain or headache.         . levothyroxine  (SYNTHROID, LEVOTHROID) 88 MCG tablet   Oral   Take 88 mcg by mouth daily before breakfast.          . metFORMIN (GLUCOPHAGE) 850 MG tablet   Oral   Take 850 mg by mouth 2 (two) times daily with a meal.         . sertraline (ZOLOFT) 100 MG tablet   Oral   Take 200 mg by mouth 2 (two) times daily.          Marland Kitchen tiotropium (SPIRIVA) 18 MCG inhalation capsule   Inhalation   Place 1 capsule (18 mcg total) into inhaler and inhale daily.   30 capsule   3   . zolpidem (AMBIEN) 10 MG tablet   Oral   Take 15 mg by mouth at bedtime as needed for sleep. Takes 1 and 1/2 tablet          BP 138/65  Pulse 84  Temp(Src) 97.7 F (36.5 C) (Oral)  Resp 16  SpO2 89% Physical Exam  Nursing note and vitals reviewed. Constitutional: She is oriented to person, place, and time. She appears well-developed and well-nourished.  HENT:  Head: Normocephalic and atraumatic.  Right Ear: External ear normal.  Left Ear: External ear normal.  Nose: Nose normal.  Eyes: Right eye exhibits no discharge. Left eye exhibits no discharge.  Cardiovascular: Normal rate, regular rhythm and normal heart sounds.   Pulmonary/Chest: Effort normal and breath sounds normal. She exhibits tenderness (Left anterior chest).  Abdominal: Soft. There is no tenderness.  Musculoskeletal: She exhibits no edema.  Neurological: She is alert and oriented to person, place, and time.  Skin: Skin is warm and dry.    ED Course  Procedures (including critical care time) Labs Review Labs Reviewed  CBC - Abnormal; Notable for the following:    RBC 5.21 (*)    MCH 25.7 (*)    MCHC 29.9 (*)    RDW 15.8 (*)    Platelets 97 (*)    All other components within normal limits  BASIC METABOLIC PANEL - Abnormal; Notable for the following:    CO2 34 (*)    Creatinine, Ser 0.36 (*)    All other components within normal limits  D-DIMER, QUANTITATIVE  POCT I-STAT TROPONIN I   Imaging Review  Dg Chest Port 1 View  02/20/2013    CLINICAL DATA:  Left chest pain increased with deep inspiration, history COPD, hypertension, diabetes, smoker  EXAM: PORTABLE CHEST - 1 VIEW  COMPARISON:  Portable exam 1100 hr compared to 12/27/2012  FINDINGS: Enlargement of cardiac silhouette.  Mediastinal contours and pulmonary vascularity normal.  Chronic interstitial lung disease similar to previous exam, greatest at right base.  No acute infiltrate, pleural effusion or pneumothorax.  Bones unremarkable.  IMPRESSION: No acute abnormalities.  Cardiomegaly with chronic interstitial lung disease changes greatest at right base.   Electronically Signed   By: Ulyses Southward M.D.   On: 02/20/2013 11:10    Date: 02/20/2013  Rate: 92  Rhythm: normal sinus rhythm  QRS Axis: normal  Intervals: normal  ST/T Wave abnormalities: normal  Conduction Disutrbances:none  Narrative Interpretation:   Old EKG Reviewed: unchanged   MDM   1. Chest pain   2. Bronchitis    With her change in cough with new productive sputum and now chest wall pain her stenosis with bronchitis. Patient is not having any new oxygen requirement or acute dyspnea. She appears well here and her pain was controlled in the ED. She has a normal EKG and a normal troponin. With her pleuritic symptoms d-dimer was evaluated as negative and I feel she is low risk for PE. I recommended a second troponin which patient declined to she states she wants to leave. She states she will followup with her PCP for outpatient management of chest pain. Will ACS is less likely given her focal tenderness, increased cough, and benign evaluation. 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    Audree Camel, MD 02/20/13 [phone]

## 2013-02-20 NOTE — ED Notes (Signed)
Pt sts she is unsure what her oxygen sats usually run but knows its not 100%.

## 2013-03-28 ENCOUNTER — Other Ambulatory Visit: Payer: Self-pay

## 2013-06-10 ENCOUNTER — Encounter (HOSPITAL_COMMUNITY): Payer: Self-pay | Admitting: Emergency Medicine

## 2013-06-10 ENCOUNTER — Emergency Department (HOSPITAL_COMMUNITY)
Admission: EM | Admit: 2013-06-10 | Discharge: 2013-06-10 | Disposition: A | Discharge status: Home / Self Care | Payer: Medicaid Other | Attending: Emergency Medicine | Admitting: Emergency Medicine

## 2013-06-10 ENCOUNTER — Emergency Department (HOSPITAL_COMMUNITY): Payer: Medicaid Other

## 2013-06-10 DIAGNOSIS — I10 Essential (primary) hypertension: Secondary | ICD-10-CM | POA: Insufficient documentation

## 2013-06-10 DIAGNOSIS — W108XXA Fall (on) (from) other stairs and steps, initial encounter: Secondary | ICD-10-CM | POA: Insufficient documentation

## 2013-06-10 DIAGNOSIS — E785 Hyperlipidemia, unspecified: Secondary | ICD-10-CM | POA: Insufficient documentation

## 2013-06-10 DIAGNOSIS — Y9301 Activity, walking, marching and hiking: Secondary | ICD-10-CM | POA: Insufficient documentation

## 2013-06-10 DIAGNOSIS — E039 Hypothyroidism, unspecified: Secondary | ICD-10-CM | POA: Insufficient documentation

## 2013-06-10 DIAGNOSIS — E119 Type 2 diabetes mellitus without complications: Secondary | ICD-10-CM | POA: Insufficient documentation

## 2013-06-10 DIAGNOSIS — Z8659 Personal history of other mental and behavioral disorders: Secondary | ICD-10-CM | POA: Insufficient documentation

## 2013-06-10 DIAGNOSIS — Y929 Unspecified place or not applicable: Secondary | ICD-10-CM | POA: Insufficient documentation

## 2013-06-10 DIAGNOSIS — F1910 Other psychoactive substance abuse, uncomplicated: Secondary | ICD-10-CM | POA: Insufficient documentation

## 2013-06-10 DIAGNOSIS — F17200 Nicotine dependence, unspecified, uncomplicated: Secondary | ICD-10-CM | POA: Insufficient documentation

## 2013-06-10 DIAGNOSIS — J449 Chronic obstructive pulmonary disease, unspecified: Secondary | ICD-10-CM | POA: Insufficient documentation

## 2013-06-10 DIAGNOSIS — Z888 Allergy status to other drugs, medicaments and biological substances status: Secondary | ICD-10-CM | POA: Insufficient documentation

## 2013-06-10 DIAGNOSIS — Z8669 Personal history of other diseases of the nervous system and sense organs: Secondary | ICD-10-CM | POA: Insufficient documentation

## 2013-06-10 DIAGNOSIS — F319 Bipolar disorder, unspecified: Secondary | ICD-10-CM | POA: Insufficient documentation

## 2013-06-10 DIAGNOSIS — R209 Unspecified disturbances of skin sensation: Secondary | ICD-10-CM | POA: Insufficient documentation

## 2013-06-10 DIAGNOSIS — M25539 Pain in unspecified wrist: Secondary | ICD-10-CM

## 2013-06-10 DIAGNOSIS — S52509A Unspecified fracture of the lower end of unspecified radius, initial encounter for closed fracture: Secondary | ICD-10-CM

## 2013-06-10 DIAGNOSIS — W64XXXA Exposure to other animate mechanical forces, initial encounter: Secondary | ICD-10-CM | POA: Insufficient documentation

## 2013-06-10 DIAGNOSIS — Z885 Allergy status to narcotic agent status: Secondary | ICD-10-CM | POA: Insufficient documentation

## 2013-06-10 DIAGNOSIS — G4700 Insomnia, unspecified: Secondary | ICD-10-CM | POA: Insufficient documentation

## 2013-06-10 DIAGNOSIS — S52599A Other fractures of lower end of unspecified radius, initial encounter for closed fracture: Secondary | ICD-10-CM | POA: Insufficient documentation

## 2013-06-10 DIAGNOSIS — J4489 Other specified chronic obstructive pulmonary disease: Secondary | ICD-10-CM | POA: Insufficient documentation

## 2013-06-10 MED ORDER — OXYCODONE-ACETAMINOPHEN 5-325 MG PO TABS: 1.0000 | ORAL_TABLET | Freq: Four times a day (QID) | ORAL | Status: DC | PRN

## 2013-06-10 MED ORDER — OXYCODONE-ACETAMINOPHEN 5-325 MG PO TABS
2.0000 | ORAL_TABLET | Freq: Once | ORAL | Status: AC
  Administered 2013-06-10: 2 via ORAL
  Filled 2013-06-10: qty 2

## 2013-06-10 NOTE — ED Notes (Signed)
Orthocalled for thumb spica, pt's daughter called for discharge.

## 2013-06-10 NOTE — ED Notes (Signed)
Pt tripped while taking dog out and is here with right wrist pain and swelling since.  Pt can wiggle fingers

## 2013-06-10 NOTE — ED Notes (Signed)
Pt has ice pack to wrist currently

## 2013-06-10 NOTE — ED Notes (Signed)
Pt daugther [name] [name] for a ride when ready for discharge [phone]

## 2013-06-10 NOTE — ED Provider Notes (Signed)
CSN: [id number]     Arrival date & time 06/10/13  1457 History   This chart was scribed for non-physician practitioner Antonietta Breach, PA-C working with Babette Relic, MD by Adriana Reams, ED Scribe. This patient was seen in room TR10C/TR10C and the patient's care was started at 1552.  First MD Initiated Contact with Patient 06/10/13 1552     Chief Complaint  Patient presents with  . Wrist Pain    The history is provided by the patient. No language interpreter was used.   HPI Comments: [name] [name] is a 58 y.o. female who presents to the Emergency Department complaining of a fall which occurred this afternoon. She states she was walking her dog when he unexpectedly pulled her, causing her to fall down 2 stairs and land on her right wrist. She currently complains of severe (rated 8/10), constant right wrist pain that radiates to her mid forearm and is described as aching. She also describes a tingling sensation in her middle and ring finger. Movement makes the pain worse. She denies LOC from the fall or any other symptoms.    Past Medical History  Diagnosis Date  . Hypertension   . Bipolar 1 disorder   . Insomnia   . Depression   . Suicidal thoughts   . COPD (chronic obstructive pulmonary disease)   . Complication of anesthesia     Hx resp distress during surgery  . Diabetes mellitus   . Hypothyroidism   . Hyperlipidemia   . Polysubstance abuse Quit 2008     H/O methamphetamine and alcohol abuse, clean x 6 years  . Tobacco abuse   . Chronic diarrhea     Followed by Dr Michail Sermon   . Shortness of breath   . CWCBJSEG(315.1)    Past Surgical History  Procedure Laterality Date  . Knee surgery      Left knee tendon surgery  . Finger surgery      middle left finger  . Hernia repair      x 3  . Appendectomy    . Abdominal hysterectomy    . Tubal ligation    . Colon surgery      "colon repair"  . Hemorrhoid surgery    . Colonoscopy with propofol N/A 12/11/2012    Procedure:  COLONOSCOPY WITH PROPOFOL;  Surgeon: Jerene Bears, MD;  Location: [facility];  Service: Gastroenterology;  Laterality: N/A;   Family History  Problem Relation Age of Onset  . Multiple sclerosis Mother   . Alcoholism Father   . Depression Brother   . Emphysema Maternal Grandfather     smoked   History  Substance Use Topics  . Smoking status: Current Every Day Smoker -- 1.00 packs/day for 42 years    Types: Cigarettes  . Smokeless tobacco: Never Used  . Alcohol Use: Yes     Comment: 1-2 drinks per day   OB History   Grav Para Term Preterm Abortions TAB SAB Ect Mult Living                 Review of Systems  Musculoskeletal: Positive for arthralgias.  Neurological: Negative for syncope.  All other systems reviewed and are negative.    Allergies  Diclofenac; Morphine and related; and Potassium-containing compounds  Home Medications   Current Outpatient Rx  Name  Route  Sig  Dispense  Refill  . albuterol (PROVENTIL) (5 MG/ML) 0.5% nebulizer solution   Nebulization   Take 2.5 mg by nebulization every 6 (six)  hours as needed for wheezing or shortness of breath.         Marland Kitchen amLODipine (NORVASC) 10 MG tablet   Oral   Take 5 mg by mouth every morning. Takes 1/2 tablet         . Aspirin-Acetaminophen-Caffeine 260-130-16 MG TABS   Oral   Take 1 packet by mouth daily as needed (for Headache).          Marland Kitchen atorvastatin (LIPITOR) 40 MG tablet   Oral   Take 40 mg by mouth daily.         Marland Kitchen azithromycin (ZITHROMAX Z-PAK) 250 MG tablet      2 po day one, then 1 daily x 4 days   5 tablet   0   . divalproex (DEPAKOTE ER) 500 MG 24 hr tablet   Oral   Take 1,000 mg by mouth 2 (two) times daily.         Marland Kitchen gabapentin (NEURONTIN) 300 MG capsule   Oral   Take 300 mg by mouth 3 (three) times daily.         Marland Kitchen ibuprofen (ADVIL,MOTRIN) 200 MG tablet   Oral   Take 800 mg by mouth every 4 (four) hours as needed for pain or headache.         . ibuprofen (ADVIL,MOTRIN)  600 MG tablet   Oral   Take 1 tablet (600 mg total) by mouth every 8 (eight) hours as needed for pain.   30 tablet   0   . levothyroxine (SYNTHROID, LEVOTHROID) 88 MCG tablet   Oral   Take 88 mcg by mouth daily before breakfast.          . metFORMIN (GLUCOPHAGE) 850 MG tablet   Oral   Take 850 mg by mouth 2 (two) times daily with a meal.         . oxyCODONE-acetaminophen (PERCOCET) 5-325 MG per tablet   Oral   Take 1 tablet by mouth every 4 (four) hours as needed for pain.   10 tablet   0   . oxyCODONE-acetaminophen (PERCOCET/ROXICET) 5-325 MG per tablet   Oral   Take 1-2 tablets by mouth every 6 (six) hours as needed for severe pain.   17 tablet   0   . sertraline (ZOLOFT) 100 MG tablet   Oral   Take 200 mg by mouth 2 (two) times daily.          Marland Kitchen tiotropium (SPIRIVA) 18 MCG inhalation capsule   Inhalation   Place 1 capsule (18 mcg total) into inhaler and inhale daily.   30 capsule   3   . zolpidem (AMBIEN) 10 MG tablet   Oral   Take 15 mg by mouth at bedtime as needed for sleep. Takes 1 and 1/2 tablet          BP 143/78  Pulse 92  Temp(Src) 98 F (36.7 C) (Oral)  SpO2 92%  Physical Exam  Nursing note and vitals reviewed. Constitutional: She is oriented to person, place, and time. She appears well-developed and well-nourished. No distress.  HENT:  Head: Normocephalic and atraumatic.  Eyes: Conjunctivae and EOM are normal. No scleral icterus.  Neck: Normal range of motion.  Cardiovascular: Normal rate, regular rhythm and intact distal pulses.   Distal radial pulses 2+ bilaterally. Capillary refill normal.  Pulmonary/Chest: Effort normal. No respiratory distress.  Musculoskeletal: She exhibits tenderness.       Right wrist: She exhibits decreased range of motion (secondary to pain), tenderness,  bony tenderness and swelling. She exhibits no effusion, no crepitus, no deformity and no laceration.       Right forearm: Normal.       Right hand: Normal.   Neurological: She is alert and oriented to person, place, and time.  No numbness, tingling or weakness of the affected extremity. Patient able to make "OK" and "thumbs up" signs with R hand.  Skin: Skin is warm and dry. No rash noted. She is not diaphoretic. No erythema. No pallor.  Psychiatric: She has a normal mood and affect. Her behavior is normal.    ED Course  Procedures (including critical care time) DIAGNOSTIC STUDIES: Oxygen Saturation is 92% on RA, adequate by my interpretation.    COORDINATION OF CARE: 4:13 PM Discussed treatment plan which includes thumb spica and 5-325 mg Percocet tablet with pt at bedside and pt agreed to plan. RICE protocol discussed. Advised pt to follow up with hand specialist, referral given.   Labs Review Labs Reviewed - No data to display Imaging Review Dg Wrist Complete Right  06/10/2013   CLINICAL DATA:  Post fall, now with diffuse right-sided wrist pain  EXAM: RIGHT WRIST - COMPLETE 3+ VIEW  COMPARISON:  None.  FINDINGS: There is a potential minimally displaced avulsion fracture involving the dorsal aspect of the distal radial epiphysis without definitive extension to the distal radiocarpal joint. This finding this issue with possible minimal amount of adjacent soft tissue swelling. Otherwise, no definite displaced fractures. Joint spaces appear preserved. No evidence of chondrocalcinosis.  IMPRESSION: Possible minimally displaced avulsion fracture involving the dorsal aspect of the distal radial epiphysis with associated adjacent soft tissue swelling. Correlation for point tenderness at this location is recommended. Alternatively, if the patient has pain referable to the anatomic snuff box, splinting and a follow-up radiograph in 10 to 14 days is recommended to evaluate for occult scaphoid fracture.   Electronically Signed   By: Sandi Mariscal M.D.   On: 06/10/2013 16:13    EKG Interpretation   None       MDM   1. Distal radial fracture   2. Wrist  pain, acute     Uncomplicated right wrist pain with possible minimally displaced avulsion fracture of the distal right radial epiphysis. Patient also with mild tenderness to the anatomical snuffbox on the right, though no scaphoid fracture appreciated on x-ray. Patient neurovascularly intact in physical exam. No gross sensory deficits appreciated. Patient placed in thumb spica splint and given Percocet for pain control. She is stable for discharge with instruction to followup with hand specialist. Return precautions discussed and patient agreeable to plan with no unaddressed concerns.  I personally performed the services described in this documentation, which was scribed in my presence. The recorded information has been reviewed and is accurate.     Antonietta Breach, PA-C 06/10/13 1642

## 2013-06-10 NOTE — Discharge Instructions (Signed)
Your x-ray shows that you may have a fracture of your distal radial epiphysis. It is also possible you may have a scaphoid fracture given your tenderness at your anatomical snuffbox; however, no definite scaphoid fracture is visualized on x-ray. Recommend that you followup with a hand specialist for further evaluation of your injuries and for follow up x-ray in 10-14 days. Take percocet as needed for pain control. Also recommend rest, ice, and elevation. Return if symptoms worsen.  RICE: Routine Care for Injuries The routine care of many injuries includes Rest, Ice, Compression, and Elevation (RICE). HOME CARE INSTRUCTIONS  Rest is needed to allow your body to heal. Routine activities can usually be resumed when comfortable. Injured tendons and bones can take up to 6 weeks to heal. Tendons are the cord-like structures that attach muscle to bone.  Ice following an injury helps keep the swelling down and reduces pain.  Put ice in a plastic bag.  Place a towel between your skin and the bag.  Leave the ice on for 15-20 minutes, 03-04 times a day. Do this while awake, for the first 24 to 48 hours. After that, continue as directed by your caregiver.  Compression helps keep swelling down. It also gives support and helps with discomfort. If an elastic bandage has been applied, it should be removed and reapplied every 3 to 4 hours. It should not be applied tightly, but firmly enough to keep swelling down. Watch fingers or toes for swelling, bluish discoloration, coldness, numbness, or excessive pain. If any of these problems occur, remove the bandage and reapply loosely. Contact your caregiver if these problems continue.  Elevation helps reduce swelling and decreases pain. With extremities, such as the arms, hands, legs, and feet, the injured area should be placed near or above the level of the heart, if possible. SEEK IMMEDIATE MEDICAL CARE IF:  You have persistent pain and swelling.  You develop  redness, numbness, or unexpected weakness.  Your symptoms are getting worse rather than improving after several days. These symptoms may indicate that further evaluation or further X-rays are needed. Sometimes, X-rays may not show a small broken bone (fracture) until 1 week or 10 days later. Make a follow-up appointment with your caregiver. Ask when your X-ray results will be ready. Make sure you get your X-ray results. Document Released: 08/21/2000 Document Revised: 08/01/2011 Document Reviewed: 10/08/2010 [facility] Patient Information 2014 [place], [place].  Cast or Splint Care Casts and splints support injured limbs and keep bones from moving while they heal.  HOME CARE  Keep the cast or splint uncovered during the drying period.  A plaster cast can take 24 to 48 hours to dry.  A fiberglass cast will dry in less than 1 hour.  Do not rest the cast on anything harder than a pillow for 24 hours.  Do not put weight on your injured limb. Do not put pressure on the cast. Wait for your doctor's approval.  Keep the cast or splint dry.  Cover the cast or splint with a plastic bag during baths or wet weather.  If you have a cast over your chest and belly (trunk), take sponge baths until the cast is taken off.  If your cast gets wet, dry it with a towel or blow dryer. Use the cool setting on the blow dryer.  Keep your cast or splint clean. Wash a dirty cast with a damp cloth.  Do not put any objects under your cast or splint.  Do not scratch the  skin under the cast with an object. If itching is a problem, use a blow dryer on a cool setting over the itchy area.  Do not trim or cut your cast.  Do not take out the padding from inside your cast.  Exercise your joints near the cast as told by your doctor.  Raise (elevate) your injured limb on 1 or 2 pillows for the first 1 to 3 days. GET HELP IF:  Your cast or splint cracks.  Your cast or splint is too tight or too loose.  You itch  badly under the cast.  Your cast gets wet or has a soft spot.  You have a bad smell coming from the cast.  You get an object stuck under the cast.  Your skin around the cast becomes red or sore.  You have new or more pain after the cast is put on. GET HELP RIGHT AWAY IF:  You have fluid leaking through the cast.  You cannot move your fingers or toes.  Your fingers or toes turn blue or white or are cool, painful, or puffy (swollen).  You have tingling or lose feeling (numbness) around the injured area.  You have bad pain or pressure under the cast.  You have trouble breathing or have shortness of breath.  You have chest pain. Document Released: 09/08/2010 Document Revised: 01/09/2013 Document Reviewed: 11/15/2012 [facility] Patient Information 2014 [place].

## 2013-06-10 NOTE — ED Notes (Signed)
Pt states that she was walking the dog and the dog got a little ways off the leash and pulled her down stairs. Pt fell down 2 stairs landing on her right wrist. Pt has CNS intact and able to wiggle fingers. Ice applied to wrist. Pt states painful to move wrist.

## 2013-06-10 NOTE — Progress Notes (Signed)
Orthopedic Tech Progress Note Patient Details:  [name] [name] [birth year] [id number]  Ortho Devices Type of Ortho Device: Ace wrap;Thumb spica splint Splint Material: Plaster Ortho Device/Splint Location: RUE Ortho Device/Splint Interventions: Ordered;Application   Braulio Bosch 06/10/2013, 4:50 PM

## 2013-06-13 NOTE — ED Provider Notes (Signed)
Medical screening examination/treatment/procedure(s) were performed by non-physician practitioner and as supervising physician I was immediately available for consultation/collaboration.  Babette Relic, MD 06/13/13 [phone]

## 2013-10-31 ENCOUNTER — Encounter (INDEPENDENT_AMBULATORY_CARE_PROVIDER_SITE_OTHER): Payer: Self-pay | Admitting: Surgery

## 2013-11-15 ENCOUNTER — Ambulatory Visit (INDEPENDENT_AMBULATORY_CARE_PROVIDER_SITE_OTHER): Payer: Medicaid Other | Admitting: Surgery

## 2013-12-19 ENCOUNTER — Emergency Department (HOSPITAL_COMMUNITY): Payer: Medicaid Other

## 2013-12-19 ENCOUNTER — Emergency Department (HOSPITAL_COMMUNITY)
Admission: EM | Admit: 2013-12-19 | Discharge: 2013-12-19 | Disposition: A | Discharge status: Home / Self Care | Payer: Medicaid Other | Attending: Emergency Medicine | Admitting: Emergency Medicine

## 2013-12-19 ENCOUNTER — Encounter (HOSPITAL_COMMUNITY): Payer: Self-pay | Admitting: Emergency Medicine

## 2013-12-19 DIAGNOSIS — F319 Bipolar disorder, unspecified: Secondary | ICD-10-CM | POA: Insufficient documentation

## 2013-12-19 DIAGNOSIS — Z791 Long term (current) use of non-steroidal anti-inflammatories (NSAID): Secondary | ICD-10-CM | POA: Diagnosis not present

## 2013-12-19 DIAGNOSIS — I10 Essential (primary) hypertension: Secondary | ICD-10-CM | POA: Insufficient documentation

## 2013-12-19 DIAGNOSIS — S86912A Strain of unspecified muscle(s) and tendon(s) at lower leg level, left leg, initial encounter: Secondary | ICD-10-CM

## 2013-12-19 DIAGNOSIS — X500XXA Overexertion from strenuous movement or load, initial encounter: Secondary | ICD-10-CM | POA: Diagnosis not present

## 2013-12-19 DIAGNOSIS — Y929 Unspecified place or not applicable: Secondary | ICD-10-CM | POA: Diagnosis not present

## 2013-12-19 DIAGNOSIS — S8990XA Unspecified injury of unspecified lower leg, initial encounter: Secondary | ICD-10-CM | POA: Insufficient documentation

## 2013-12-19 DIAGNOSIS — J449 Chronic obstructive pulmonary disease, unspecified: Secondary | ICD-10-CM | POA: Diagnosis not present

## 2013-12-19 DIAGNOSIS — F17200 Nicotine dependence, unspecified, uncomplicated: Secondary | ICD-10-CM | POA: Insufficient documentation

## 2013-12-19 DIAGNOSIS — Z79899 Other long term (current) drug therapy: Secondary | ICD-10-CM | POA: Diagnosis not present

## 2013-12-19 DIAGNOSIS — J4489 Other specified chronic obstructive pulmonary disease: Secondary | ICD-10-CM | POA: Insufficient documentation

## 2013-12-19 DIAGNOSIS — S99929A Unspecified injury of unspecified foot, initial encounter: Secondary | ICD-10-CM

## 2013-12-19 DIAGNOSIS — S99919A Unspecified injury of unspecified ankle, initial encounter: Secondary | ICD-10-CM

## 2013-12-19 DIAGNOSIS — Z9889 Other specified postprocedural states: Secondary | ICD-10-CM | POA: Diagnosis not present

## 2013-12-19 DIAGNOSIS — Y9389 Activity, other specified: Secondary | ICD-10-CM | POA: Diagnosis not present

## 2013-12-19 DIAGNOSIS — IMO0002 Reserved for concepts with insufficient information to code with codable children: Secondary | ICD-10-CM | POA: Insufficient documentation

## 2013-12-19 DIAGNOSIS — E119 Type 2 diabetes mellitus without complications: Secondary | ICD-10-CM | POA: Insufficient documentation

## 2013-12-19 DIAGNOSIS — M25562 Pain in left knee: Secondary | ICD-10-CM

## 2013-12-19 MED ORDER — OXYCODONE-ACETAMINOPHEN 5-325 MG PO TABS
2.0000 | ORAL_TABLET | Freq: Once | ORAL | Status: AC
  Administered 2013-12-19: 2 via ORAL
  Filled 2013-12-19: qty 2

## 2013-12-19 MED ORDER — OXYCODONE-ACETAMINOPHEN 5-325 MG PO TABS: 1.0000 | ORAL_TABLET | Freq: Four times a day (QID) | ORAL | Status: DC | PRN

## 2013-12-19 NOTE — Progress Notes (Signed)
  CARE MANAGEMENT ED NOTE 12/19/2013  Patient:  [name] [name], [name] [name]   Account Number:  [id number]  Date Initiated:  12/19/2013  Documentation initiated by:  Livia Snellen  Subjective/Objective Assessment:   Patient presents to ED post knee injury     Subjective/Objective Assessment Detail:     Action/Plan:   Action/Plan Detail:   Anticipated DC Date:  12/19/2013     Status Recommendation to Physician:   Result of Recommendation:    Other ED Services  Consult Working [place]  CM consult  Other    Choice offered to / List presented to:            Status of service:  Completed, signed off  ED Comments:   ED Comments Detail:  [facility] consulted by EDPA for a walker.  EDCM spoke to patient at bedside.  Patient lives with her daughter.  Patient reports she can ambulate, she would just feel more stable with a walker.  Patient feels safe to go home thia evening and daughter agrees to acquire walker tomorrow.  Patient reports she gets her oxygen from [place].  EDCM provided patient with printed list of [place] locations, medical supplies stores in [facility] and printed order for rolling walker.  EDCM also suggested purchasing walker from [facility], [facility], etc. if it was more affordable for the patient.  Patient thankful for assistance.  Discussed patient with EDPA.  No further EDCM needs at this time.

## 2013-12-19 NOTE — Discharge Instructions (Signed)
Recommend you take Percocet as prescribed. Continue to take ibuprofen or Aleve for swelling. Ice your knee at least 3-4 times per day. Wear a knee sleeve for stability. Care management will be contacted regarding providing you a walker. Follow up with an orthopedist.  Knee Sprain A knee sprain is a tear in one of the strong, fibrous tissues that connect the bones (ligaments) in your knee. The severity of the sprain depends on how much of the ligament is torn. The tear can be either partial or complete. CAUSES  Often, sprains are a result of a fall or injury. The force of the impact causes the fibers of your ligament to stretch too much. This excess tension causes the fibers of your ligament to tear. SIGNS AND SYMPTOMS  You may have some loss of motion in your knee. Other symptoms include:  Bruising.  Pain in the knee area.  Tenderness of the knee to the touch.  Swelling. DIAGNOSIS  To diagnose a knee sprain, your health care provider will physically examine your knee. Your health care provider may also suggest an X-ray exam of your knee to make sure no bones are broken. TREATMENT  If your ligament is only partially torn, treatment usually involves keeping the knee in a fixed position (immobilization) or bracing your knee for activities that require movement for several weeks. To do this, your health care provider will apply a bandage, cast, or splint to keep your knee from moving and to support your knee during movement until it heals. For a partially torn ligament, the healing process usually takes 4-6 weeks. If your ligament is completely torn, depending on which ligament it is, you may need surgery to reconnect the ligament to the bone or reconstruct it. After surgery, a cast or splint may be applied and will need to stay on your knee for 4-6 weeks while your ligament heals. HOME CARE INSTRUCTIONS  Keep your injured knee elevated to decrease swelling.  To ease pain and swelling, apply ice  to the injured area:  Put ice in a plastic bag.  Place a towel between your skin and the bag.  Leave the ice on for 20 minutes, 2-3 times a day.  Only take medicine for pain as directed by your health care provider.  Do not leave your knee unprotected until pain and stiffness go away (usually 4-6 weeks).  If you have a cast or splint, do not allow it to get wet. If you have been instructed not to remove it, cover it with a plastic bag when you shower or bathe. Do not swim.  Your health care provider may suggest exercises for you to do during your recovery to prevent or limit permanent weakness and stiffness. SEEK IMMEDIATE MEDICAL CARE IF:  Your cast or splint becomes damaged.  Your pain becomes worse.  You have significant pain, swelling, or numbness below the cast or splint. MAKE SURE YOU:  Understand these instructions.  Will watch your condition.  Will get help right away if you are not doing well or get worse. Document Released: 05/09/2005 Document Revised: 02/27/2013 Document Reviewed: 12/19/2012 [facility] Patient Information 2015 [place], [place]. This information is not intended to replace advice given to you by your health care provider. Make sure you discuss any questions you have with your health care provider.

## 2013-12-19 NOTE — ED Notes (Signed)
Pt has a ride home.  

## 2013-12-19 NOTE — ED Notes (Signed)
Pt states on Tuesday she twisted left knee and now is c/o pain in knee.

## 2013-12-19 NOTE — ED Provider Notes (Signed)
CSN: [id number]     Arrival date & time 12/19/13  1853 History   None    This chart was scribed for non-physician practitioner, Antonietta Breach, PA-C  working with Babette Relic, MD by Forrestine Him, ED Scribe. This patient was seen in room WTR8/WTR8 and the patient's care was started at 9:53 PM.   Chief Complaint  Patient presents with  . Knee Pain   The history is provided by the patient. No language interpreter was used.    HPI Comments: [name] [name] is a 58 y.o. Female with PMHx of HTN, COPD, hyperlipidemia, and hypothyroidism who presents to the Emergency Department complaining of constant, moderate L knee pain x 2 days that has progressively worsened. She describes this pain as achy.  Discomfort is exacerbated with standing and ambulation. Pt states she went down a ladder at the pool and fell resulting in her twisting her L lower extremity. She has tried OTC Ibuprofen with mild temporary improvement. At this time she denies any fever or chills. No loss of sensation, weakness, or numbness. She reports PSHx of L knee surgery 23 years ago.  Pt with known allergies to diclofenac, morphine, and potassium-containing compounds. No other concerns this visit.   Past Medical History  Diagnosis Date  . Hypertension   . Bipolar 1 disorder   . Insomnia   . Depression   . Suicidal thoughts   . COPD (chronic obstructive pulmonary disease)   . Complication of anesthesia     Hx resp distress during surgery  . Diabetes mellitus   . Hypothyroidism   . Hyperlipidemia   . Polysubstance abuse Quit 2008     H/O methamphetamine and alcohol abuse, clean x 6 years  . Tobacco abuse   . Chronic diarrhea     Followed by Dr Michail Sermon   . Shortness of breath   . DGUYQIHK(742.5)    Past Surgical History  Procedure Laterality Date  . Knee surgery      Left knee tendon surgery  . Finger surgery      middle left finger  . Hernia repair      x 3  . Appendectomy    . Abdominal hysterectomy    . Tubal  ligation    . Colon surgery      "colon repair"  . Hemorrhoid surgery    . Colonoscopy with propofol N/A 12/11/2012    Procedure: COLONOSCOPY WITH PROPOFOL;  Surgeon: Jerene Bears, MD;  Location: [facility] ENDOS[facility];  Service: Gastroenterology;  Laterality: N/A;   Family History  Problem Relation Age of Onset  . Multiple sclerosis Mother   . Alcoholism Father   . Depression Brother   . Emphysema Maternal Grandfather     smoked   History  Substance Use Topics  . Smoking status: Current Every Day Smoker -- 1.00 packs/day for 42 years    Types: Cigarettes  . Smokeless tobacco: Never Used  . Alcohol Use: Yes     Comment: 1-2 drinks per day   OB History   Grav Para Term Preterm Abortions TAB SAB Ect Mult Living                  Review of Systems  Constitutional: Negative for fever and chills.  Musculoskeletal: Positive for arthralgias (L knee) and joint swelling (L knee).  Neurological: Negative for weakness and numbness.  All other systems reviewed and are negative.    Allergies  Diclofenac; Morphine and related; and Potassium-containing compounds  Home Medications  Prior to Admission medications   Medication Sig Start Date End Date Taking? Authorizing Provider  albuterol (PROVENTIL) (5 MG/ML) 0.5% nebulizer solution Take 2.5 mg by nebulization every 6 (six) hours as needed for wheezing or shortness of breath. 04/16/12  Yes Bonnielee Haff, MD  amitriptyline (ELAVIL) 50 MG tablet Take 200 mg by mouth at bedtime.   Yes Historical Provider, MD  amLODipine (NORVASC) 10 MG tablet Take 5 mg by mouth every morning. Takes 1/2 tablet   Yes Historical Provider, MD  busPIRone (BUSPAR) 10 MG tablet Take 10 mg by mouth 2 (two) times daily.   Yes Historical Provider, MD  divalproex (DEPAKOTE ER) 500 MG 24 hr tablet Take 500 mg by mouth every morning.    Yes Historical Provider, MD  divalproex (DEPAKOTE ER) 500 MG 24 hr tablet Take 1,000 mg by mouth at bedtime.   Yes Historical Provider, MD   gabapentin (NEURONTIN) 300 MG capsule Take 300 mg by mouth 3 (three) times daily.   Yes Historical Provider, MD  ibuprofen (ADVIL,MOTRIN) 200 MG tablet Take 800 mg by mouth every 4 (four) hours as needed for pain or headache.   Yes Historical Provider, MD  metFORMIN (GLUCOPHAGE) 850 MG tablet Take 850 mg by mouth 2 (two) times daily with a meal.   Yes Historical Provider, MD  sertraline (ZOLOFT) 100 MG tablet Take 200 mg by mouth 2 (two) times daily.    Yes Historical Provider, MD  zolpidem (AMBIEN) 10 MG tablet Take 15 mg by mouth at bedtime as needed for sleep.    Yes Historical Provider, MD  oxyCODONE-acetaminophen (PERCOCET/ROXICET) 5-325 MG per tablet Take 1-2 tablets by mouth every 6 (six) hours as needed for moderate pain or severe pain. 12/19/13   Antonietta Breach, PA-C   Triage Vitals: BP 157/76  Pulse 93  Temp(Src) 97.8 F (36.6 C) (Oral)  Resp 20  SpO2 90%   Physical Exam  Nursing note and vitals reviewed. Constitutional: She is oriented to person, place, and time. She appears well-developed and well-nourished. No distress.  Nontoxic/nonseptic appearing  HENT:  Head: Normocephalic and atraumatic.  Eyes: Conjunctivae and EOM are normal. No scleral icterus.  Neck: Normal range of motion.  Cardiovascular: Normal rate, regular rhythm and intact distal pulses.   DP and PT pulses 2+ in left lower extremity  Pulmonary/Chest: Effort normal. No respiratory distress.  Abdominal: She exhibits no distension.  Musculoskeletal:  Tenderness to left knee and medial joint line. Swelling to me appreciated without effusion. Good passive range of motion with limited active range of motion secondary to pain. No erythema or heat to touch.  Neurological: She is alert and oriented to person, place, and time. She exhibits normal muscle tone. Coordination normal.  No gross sensory deficits appreciated. Patient able to wiggle all fingers.  Skin: Skin is warm and dry. No rash noted. She is not diaphoretic.  No erythema. No pallor.  Psychiatric: She has a normal mood and affect. Her behavior is normal.    ED Course  Procedures (including critical care time)  DIAGNOSTIC STUDIES: Oxygen Saturation is 90% on RA, low by my interpretation.    COORDINATION OF CARE: 9:54 PM- Will order DG Knee Complete. Will give dose of pain medication here in ED. Discussed treatment plan with pt at bedside and pt agreed to plan.     Labs Review Labs Reviewed - No data to display  Imaging Review Dg Knee Complete 4 Views Left  12/19/2013   CLINICAL DATA:  Fall.  Knee  pain  EXAM: LEFT KNEE - COMPLETE 4+ VIEW  COMPARISON:  None.  FINDINGS: There is a small suprapatellar joint effusion. The bones appear osteopenic. Medial compartment narrowing, marginal spur formation in sharp in the tibial spines noted. No fractures identified  IMPRESSION: 1. Small joint effusion. 2. Osteoarthritis.   Electronically Signed   By: Kerby Moors M.D.   On: 12/19/2013 19:57     EKG Interpretation None      MDM   Final diagnoses:  Knee pain, acute, left  Knee strain, left, initial encounter    58 year old female presents to the emergency department after twisting her left knee 2 days ago. Patient has had constant pain since this time. On exam, patient is neurovascularly intact. No gross sensory deficits appreciated. No evidence of septic joint. X-ray today significant for osteoarthritis and a small joint effusion. Given symptoms and swelling, question ligamentous injury. Patient will require orthopedic followup. Referral provided. Also provided knee sleeve and walker for ambulation. RICE advised and Percocet prescribed for pain control. Return precautions provided and patient agreeable to plan with no unaddressed concerns.  I personally performed the services described in this documentation, which was scribed in my presence. The recorded information has been reviewed and is accurate.    Antonietta Breach, PA-C 12/20/13 [phone]

## 2013-12-20 NOTE — ED Provider Notes (Signed)
Medical screening examination/treatment/procedure(s) were performed by non-physician practitioner and as supervising physician I was immediately available for consultation/collaboration.   EKG Interpretation None       Babette Relic, MD 12/20/13 2133

## 2013-12-30 ENCOUNTER — Encounter (HOSPITAL_COMMUNITY): Payer: Self-pay | Admitting: Emergency Medicine

## 2013-12-30 ENCOUNTER — Emergency Department (HOSPITAL_COMMUNITY)
Admission: EM | Admit: 2013-12-30 | Discharge: 2013-12-30 | Disposition: A | Discharge status: Home / Self Care | Payer: Medicaid Other | Attending: Emergency Medicine | Admitting: Emergency Medicine

## 2013-12-30 ENCOUNTER — Emergency Department (HOSPITAL_COMMUNITY): Payer: Medicaid Other

## 2013-12-30 DIAGNOSIS — J449 Chronic obstructive pulmonary disease, unspecified: Secondary | ICD-10-CM

## 2013-12-30 DIAGNOSIS — IMO0002 Reserved for concepts with insufficient information to code with codable children: Secondary | ICD-10-CM | POA: Diagnosis not present

## 2013-12-30 DIAGNOSIS — Z79899 Other long term (current) drug therapy: Secondary | ICD-10-CM | POA: Diagnosis not present

## 2013-12-30 DIAGNOSIS — L02419 Cutaneous abscess of limb, unspecified: Secondary | ICD-10-CM | POA: Insufficient documentation

## 2013-12-30 DIAGNOSIS — R600 Localized edema: Secondary | ICD-10-CM

## 2013-12-30 DIAGNOSIS — I10 Essential (primary) hypertension: Secondary | ICD-10-CM | POA: Insufficient documentation

## 2013-12-30 DIAGNOSIS — M7989 Other specified soft tissue disorders: Secondary | ICD-10-CM | POA: Insufficient documentation

## 2013-12-30 DIAGNOSIS — E119 Type 2 diabetes mellitus without complications: Secondary | ICD-10-CM | POA: Diagnosis not present

## 2013-12-30 DIAGNOSIS — J441 Chronic obstructive pulmonary disease with (acute) exacerbation: Secondary | ICD-10-CM | POA: Insufficient documentation

## 2013-12-30 DIAGNOSIS — F319 Bipolar disorder, unspecified: Secondary | ICD-10-CM | POA: Insufficient documentation

## 2013-12-30 DIAGNOSIS — R609 Edema, unspecified: Secondary | ICD-10-CM | POA: Diagnosis not present

## 2013-12-30 DIAGNOSIS — F17200 Nicotine dependence, unspecified, uncomplicated: Secondary | ICD-10-CM | POA: Diagnosis not present

## 2013-12-30 DIAGNOSIS — M1710 Unilateral primary osteoarthritis, unspecified knee: Secondary | ICD-10-CM | POA: Insufficient documentation

## 2013-12-30 DIAGNOSIS — L03119 Cellulitis of unspecified part of limb: Secondary | ICD-10-CM

## 2013-12-30 DIAGNOSIS — M79609 Pain in unspecified limb: Secondary | ICD-10-CM

## 2013-12-30 LAB — CBC WITH DIFFERENTIAL/PLATELET
Basophils Absolute: 0.1 10*3/uL (ref 0.0–0.1)
Basophils Relative: 1 % (ref 0–1)
EOS PCT: 6 % — AB (ref 0–5)
Eosinophils Absolute: 0.5 10*3/uL (ref 0.0–0.7)
HEMATOCRIT: 41.8 % (ref 36.0–46.0)
HEMOGLOBIN: 13.1 g/dL (ref 12.0–15.0)
LYMPHS ABS: 2.2 10*3/uL (ref 0.7–4.0)
LYMPHS PCT: 29 % (ref 12–46)
MCH: 26.9 pg (ref 26.0–34.0)
MCHC: 31.3 g/dL (ref 30.0–36.0)
MCV: 85.8 fL (ref 78.0–100.0)
MONO ABS: 0.4 10*3/uL (ref 0.1–1.0)
MONOS PCT: 5 % (ref 3–12)
NEUTROS ABS: 4.5 10*3/uL (ref 1.7–7.7)
Neutrophils Relative %: 59 % (ref 43–77)
Platelets: 157 10*3/uL (ref 150–400)
RBC: 4.87 MIL/uL (ref 3.87–5.11)
RDW: 14.7 % (ref 11.5–15.5)
WBC: 7.6 10*3/uL (ref 4.0–10.5)

## 2013-12-30 LAB — BASIC METABOLIC PANEL
ANION GAP: 13 (ref 5–15)
BUN: 15 mg/dL (ref 6–23)
CALCIUM: 9 mg/dL (ref 8.4–10.5)
CHLORIDE: 99 meq/L (ref 96–112)
CO2: 28 mEq/L (ref 19–32)
CREATININE: 0.45 mg/dL — AB (ref 0.50–1.10)
GFR calc Af Amer: >90 mL/min (ref >90)
GFR calc non Af Amer: >90 mL/min (ref >90)
GLUCOSE: 83 mg/dL (ref 70–99)
Potassium: 4.4 mEq/L (ref 3.7–5.3)
Sodium: 140 mEq/L (ref 137–147)

## 2013-12-30 LAB — PRO B NATRIURETIC PEPTIDE: Pro B Natriuretic peptide (BNP): 59.5 pg/mL (ref 0–125)

## 2013-12-30 MED ORDER — SULFAMETHOXAZOLE-TMP DS 800-160 MG PO TABS: 1.0000 | ORAL_TABLET | Freq: Two times a day (BID) | ORAL | Status: DC

## 2013-12-30 MED ORDER — OXYCODONE-ACETAMINOPHEN 5-325 MG PO TABS: 1.0000 | ORAL_TABLET | Freq: Four times a day (QID) | ORAL | Status: DC | PRN

## 2013-12-30 MED ORDER — CEPHALEXIN 500 MG PO CAPS: ORAL_CAPSULE | ORAL | Status: DC

## 2013-12-30 NOTE — ED Notes (Signed)
PA at bedside.

## 2013-12-30 NOTE — Discharge Instructions (Signed)
Keep legs elevated during the day to help with swelling. Take antibiotic until it is finished. Take percocet as directed, as needed for pain but do not drive or operate machinery with pain medication use. Followup with Primary Care doctor in 7 days for recheck and follow up.  Return to emergency department for emergent changing or worsening symptoms.   Cellulitis Cellulitis is an infection of the skin and the tissue under the skin. The infected area is usually red and tender. This happens most often in the arms and lower legs. HOME CARE   Take your antibiotic medicine as told. Finish the medicine even if you start to feel better.  Keep the infected arm or leg raised (elevated).  Put a warm cloth on the area up to 4 times per day.  Only take medicines as told by your doctor.  Keep all doctor visits as told. GET HELP IF:  You see red streaks on the skin coming from the infected area.  Your red area gets bigger or turns a dark color.  Your bone or joint under the infected area is painful after the skin heals.  Your infection comes back in the same area or different area.  You have a puffy (swollen) bump in the infected area.  You have new symptoms.  You have a fever. GET HELP RIGHT AWAY IF:   You feel very sleepy.  You throw up (vomit) or have watery poop (diarrhea).  You feel sick and have muscle aches and pains. MAKE SURE YOU:   Understand these instructions.  Will watch your condition.  Will get help right away if you are not doing well or get worse. Document Released: 10/26/2007 Document Revised: 09/23/2013 Document Reviewed: 07/25/2011 [facility] Patient Information 2015 [place], [place]. This information is not intended to replace advice given to you by your health care provider. Make sure you discuss any questions you have with your health care provider.   Peripheral Edema You have swelling in your legs (peripheral edema). This swelling is due to excess accumulation  of salt and water in your body. Edema may be a sign of heart, kidney or liver disease, or a side effect of a medication. It may also be due to problems in the leg veins. Elevating your legs and using special support stockings may be very helpful, if the cause of the swelling is due to poor venous circulation. Avoid long periods of standing, whatever the cause. Treatment of edema depends on identifying the cause. Chips, pretzels, pickles and other salty foods should be avoided. Restricting salt in your diet is almost always needed. Water pills (diuretics) are often used to remove the excess salt and water from your body via urine. These medicines prevent the kidney from reabsorbing sodium. This increases urine flow. Diuretic treatment may also result in lowering of potassium levels in your body. Potassium supplements may be needed if you have to use diuretics daily. Daily weights can help you keep track of your progress in clearing your edema. You should call your caregiver for follow up care as recommended. SEEK IMMEDIATE MEDICAL CARE IF:   You have increased swelling, pain, redness, or heat in your legs.  You develop shortness of breath, especially when lying down.  You develop chest or abdominal pain, weakness, or fainting.  You have a fever. Document Released: 06/16/2004 Document Revised: 08/01/2011 Document Reviewed: 05/27/2009 [facility] Patient Information 2015 [place], [place]. This information is not intended to replace advice given to you by your health care provider. Make  sure you discuss any questions you have with your health care provider. ° ° °

## 2013-12-30 NOTE — ED Provider Notes (Signed)
Medical screening examination/treatment/procedure(s) were performed by non-physician practitioner and as supervising physician I was immediately available for consultation/collaboration.   EKG Interpretation None        Orpah Greek, MD 12/30/13 1535

## 2013-12-30 NOTE — Progress Notes (Signed)
VASCULAR LAB PRELIMINARY  PRELIMINARY  PRELIMINARY  PRELIMINARY  Bilateral lower extremity venous duplex completed.    Preliminary report:  Bilateral:  No evidence of DVT, superficial thrombosis, or Baker's Cyst.   Ronell Boldin, RVS 12/30/2013, 3:15 PM

## 2013-12-30 NOTE — ED Provider Notes (Signed)
CSN: [id number]     Arrival date & time 12/30/13  1141 History   First MD Initiated Contact with Patient 12/30/13 1214     Chief Complaint  Patient presents with  . Leg Swelling     (Consider location/radiation/quality/duration/timing/severity/associated sxs/prior Treatment) HPI Comments: [name] [name] is a 58 y.o. Female with a PMHx of HTN, bipolar, COPD, DM, hypothyroidism, HLD, polysubstance abuse, arthritis s/p L knee "tendon surgery", and headaches presenting to the ER with complaints of BLE swelling and erythema and L posterior knee pain. States the pain began approx 1 week ago, is a pressure/tight/aching pain in BLEs, constant, nonradiating, worse with activity and with no known alleviating factors, which has gradually become more erythematous and warm, R>L. Also states there is an achy pain in the L posterior knee which is worse with palpation. Denies fevers/chills, CP, acutely changed cough, acutely changed SOB, back pain, dysuria, hematuria, abd pain, N/V/D/C, paresthesias, weakness, or numbness. Denies any known insect bites or wounds to legs, no red streaking. Denies hx of DVT/PE, hormonal replacement, recent travel or immobilization. States she injured her L knee two weeks ago, was given a knee sleeve, and has been ambulatory with a walker since injury.  Patient is a 59 y.o. female presenting with leg pain. The history is provided by the patient. No language interpreter was used.  Leg Pain Location:  Leg Time since incident:  1 week Injury: no   Leg location:  L lower leg and R lower leg Pain details:    Quality:  Pressure and aching   Radiates to:  Does not radiate   Severity:  Mild   Onset quality:  Gradual   Duration:  1 week   Timing:  Constant   Progression:  Unchanged Chronicity:  New Dislocation: no   Relieved by:  None tried Worsened by:  Activity Ineffective treatments:  None tried Associated symptoms: swelling   Associated symptoms: no back pain, no  decreased ROM, no fever, no muscle weakness, no neck pain, no numbness, no stiffness and no tingling   Risk factors: obesity     Past Medical History  Diagnosis Date  . Hypertension   . Bipolar 1 disorder   . Insomnia   . Depression   . Suicidal thoughts   . COPD (chronic obstructive pulmonary disease)   . Complication of anesthesia     Hx resp distress during surgery  . Diabetes mellitus   . Hypothyroidism   . Hyperlipidemia   . Polysubstance abuse Quit 2008     H/O methamphetamine and alcohol abuse, clean x 6 years  . Tobacco abuse   . Chronic diarrhea     Followed by Dr Michail Sermon   . Shortness of breath   . QIWLNLGX(211.9)    Past Surgical History  Procedure Laterality Date  . Knee surgery      Left knee tendon surgery  . Finger surgery      middle left finger  . Hernia repair      x 3  . Appendectomy    . Abdominal hysterectomy    . Tubal ligation    . Colon surgery      "colon repair"  . Hemorrhoid surgery    . Colonoscopy with propofol N/A 12/11/2012    Procedure: COLONOSCOPY WITH PROPOFOL;  Surgeon: Jerene Bears, MD;  Location: [facility];  Service: Gastroenterology;  Laterality: N/A;   Family History  Problem Relation Age of Onset  . Multiple sclerosis Mother   . Alcoholism Father   .  Depression Brother   . Emphysema Maternal Grandfather     smoked   History  Substance Use Topics  . Smoking status: Current Every Day Smoker -- 1.00 packs/day for 42 years    Types: Cigarettes  . Smokeless tobacco: Never Used  . Alcohol Use: Yes     Comment: 1-2 drinks per day   OB History   Grav Para Term Preterm Abortions TAB SAB Ect Mult Living                 Review of Systems  Constitutional: Negative for fever, chills and diaphoresis.  Respiratory: Positive for cough (chronic per pt) and shortness of breath (chronic per pt, no acute change). Negative for chest tightness and wheezing.   Cardiovascular: Positive for leg swelling. Negative for chest pain.   Gastrointestinal: Negative for nausea, vomiting, abdominal pain, diarrhea, constipation and abdominal distention.  Genitourinary: Negative for dysuria, hematuria and flank pain.  Musculoskeletal: Negative for arthralgias, back pain, joint swelling, neck pain, neck stiffness and stiffness.  Skin: Positive for color change. Negative for wound.  Neurological: Negative for dizziness, weakness, light-headedness, numbness and headaches.  Hematological: Does not bruise/bleed easily.  Psychiatric/Behavioral: Negative for confusion.  10 Systems reviewed and are negative for acute change except as noted in the HPI.     Allergies  Diclofenac; Morphine and related; and Potassium-containing compounds  Home Medications   Prior to Admission medications   Medication Sig Start Date End Date Taking? Authorizing Provider  albuterol (PROVENTIL) (5 MG/ML) 0.5% nebulizer solution Take 2.5 mg by nebulization every 6 (six) hours as needed for wheezing or shortness of breath. 04/16/12  Yes Bonnielee Haff, MD  amitriptyline (ELAVIL) 50 MG tablet Take 200 mg by mouth at bedtime.   Yes Historical Provider, MD  amLODipine (NORVASC) 10 MG tablet Take 5 mg by mouth every morning.    Yes Historical Provider, MD  busPIRone (BUSPAR) 10 MG tablet Take 10 mg by mouth 2 (two) times daily.   Yes Historical Provider, MD  divalproex (DEPAKOTE) 500 MG DR tablet Take 500-1,000 mg by mouth 2 (two) times daily. *Takes 500mg  in the morning and 1000mg  at bedtime*   Yes Historical Provider, MD  gabapentin (NEURONTIN) 300 MG capsule Take 300 mg by mouth 3 (three) times daily.   Yes Historical Provider, MD  metFORMIN (GLUCOPHAGE) 850 MG tablet Take 850 mg by mouth 2 (two) times daily with a meal.   Yes Historical Provider, MD  sertraline (ZOLOFT) 100 MG tablet Take 200 mg by mouth daily.    Yes Historical Provider, MD  zolpidem (AMBIEN) 10 MG tablet Take 10 mg by mouth at bedtime.    Yes Historical Provider, MD  cephALEXin (KEFLEX)  500 MG capsule 2 caps po bid x 7 days 12/30/13   Hokulani Rogel Strupp Camprubi-Soms, PA-C  oxyCODONE-acetaminophen (PERCOCET) 5-325 MG per tablet Take 1-2 tablets by mouth every 6 (six) hours as needed for severe pain. 12/30/13   Cree Napoli Strupp Camprubi-Soms, PA-C  sulfamethoxazole-trimethoprim (BACTRIM DS) 800-160 MG per tablet Take 1 tablet by mouth 2 (two) times daily. 12/30/13   Lathaniel Legate Strupp Camprubi-Soms, PA-C   BP 108/57  Pulse 88  Temp(Src) 99 F (37.2 C) (Oral)  Resp 20  SpO2 92% Physical Exam  Nursing note and vitals reviewed. Constitutional: She is oriented to person, place, and time. She appears well-developed and well-nourished. No distress.  Afebrile, NAD. Oxygen saturations 84% on RA but 95% on 2L via [place] which is a chronic requirement per pt  HENT:  Head: Normocephalic and atraumatic.  Mouth/Throat: Mucous membranes are normal.  Eyes: Conjunctivae and EOM are normal. Right eye exhibits no discharge. Left eye exhibits no discharge.  Neck: Normal range of motion. Neck supple.  Cardiovascular: Normal rate, regular rhythm, normal heart sounds and intact distal pulses.  Exam reveals no gallop and no friction rub.   No murmur heard. Pulses:      Dorsalis pedis pulses are 2+ on the right side, and 2+ on the left side.       Posterior tibial pulses are 2+ on the right side, and 2+ on the left side.  RRR, nl s1/s2, no m/r/g. Distal pulses intact bilaterally. Palpable cord along L popliteal area superficially which is TTP, no skin changes overlying this area  Pulmonary/Chest: Effort normal and breath sounds normal. No respiratory distress. She has no decreased breath sounds. She has no wheezes. She has no rhonchi. She has no rales.  CTAB in all lung fields, no w/r/r. Oxygen saturations 95% on 2L via [place], which is a chronic requirement per pt  Abdominal: Soft. Normal appearance and bowel sounds are normal. She exhibits no distension. There is no tenderness. There is no rigidity, no rebound  and no guarding.  Chronic diastasis recti  Musculoskeletal: Normal range of motion.  FROM in BLEs. All joints nonTTP. BLE with 1+ pretibial edema up to knees, R>L. BLEs with mild erythema, R>L, slightly warm to touch on R. Neg homan's sign bilaterally. Distal pulses intact. Strength 5/5 in all extremities, sensation grossly intact in all extremities.   Neurological: She is alert and oriented to person, place, and time. She has normal strength. No sensory deficit.  Sensation grossly intact in all extremities, strength 5/5 in all extremities, pulses intact  Skin: Skin is warm, dry and intact. No rash noted. There is erythema.  1+ pitting edema in BLEs, with R>L. Erythema to BLEs, R>L, with mild warmth in RLE. Nails yellowed and hypertrophic bilaterally. Corns noted to B/L feet on medially sides. No bite wounds or puncture marks.   Psychiatric: She has a normal mood and affect.    ED Course  Procedures (including critical care time) Labs Review Labs Reviewed  CBC WITH DIFFERENTIAL - Abnormal; Notable for the following:    Eosinophils Relative 6 (*)    All other components within normal limits  BASIC METABOLIC PANEL - Abnormal; Notable for the following:    Creatinine, Ser 0.45 (*)    All other components within normal limits  PRO B NATRIURETIC PEPTIDE    Imaging Review Dg Chest 2 View  12/30/2013   CLINICAL DATA:  Short of breath. COPD and bilateral lower extremity swelling.  EXAM: CHEST  2 VIEW  COMPARISON:  02/20/2013  FINDINGS: Mild hyperinflation. Lateral view degraded by patient arm position. Apical lordotic frontal radiograph. Midline trachea. Mild cardiomegaly. Mediastinal contours otherwise within normal limits. No pleural effusion or pneumothorax. No congestive failure. Clear lungs.  IMPRESSION: Hyperinflation and cardiomegaly, without acute disease.   Electronically Signed   By: Abigail Miyamoto M.D.   On: 12/30/2013 13:42   Duplex venous U/S BLE: no DVT, superficial thrombophlebitis,  or baker's cyst.   EKG Interpretation None      MDM   Final diagnoses:  Bilateral lower extremity edema  Cellulitis of lower extremity, unspecified laterality  Chronic obstructive pulmonary disease, unspecified COPD, unspecified chronic bronchitis type    58y/o female with BLE swelling and erythema, R>L and L popliteal palpable cord. Likely superficial thrombophlebitis and dependent edema with stasis  dermatitis, but will obtain basic labs, CXR, BNP, and u/s of legs to r/o DVT. Doubt D-dimer would help decision making, which is why U/S was ordered. Will reassess shortly.   3:21 PM Pt back from DVT study, which was negative for DVT. CXR showing chronic COPD changes, no acute change. CBC w/diff WNL, BMP WNL, BNP negative. Will treat as cellulitis, although this might be venous dermatitis. Discussed elevation of LEs and f/up with PCP. Rx for percocet and keflex/bactrim for cellulitis. I explained the diagnosis and have given explicit precautions to return to the ER including for any other new or worsening symptoms. The patient understands and accepts the medical plan as it's been dictated and I have answered their questions. Discharge instructions concerning home care and prescriptions have been given. The patient is STABLE and is discharged to home in good condition.  BP 108/57  Pulse 88  Temp(Src) 99 F (37.2 C) (Oral)  Resp 20  SpO2 92%  Meds ordered this encounter  Medications  . oxyCODONE-acetaminophen (PERCOCET) 5-325 MG per tablet    Sig: Take 1-2 tablets by mouth every 6 (six) hours as needed for severe pain.    Dispense:  10 tablet    Refill:  0    Order Specific Question:  Supervising Provider    Answer:  Noemi Chapel D [[id number]]  . cephALEXin (KEFLEX) 500 MG capsule    Sig: 2 caps po bid x 7 days    Dispense:  28 capsule    Refill:  0    Order Specific Question:  Supervising Provider    Answer:  Noemi Chapel D [[id number]]  . sulfamethoxazole-trimethoprim (BACTRIM DS) 800-160  MG per tablet    Sig: Take 1 tablet by mouth 2 (two) times daily.    Dispense:  14 tablet    Refill:  0    Order Specific Question:  Supervising Provider    Answer:  Johnna Acosta [id number] Roshanna Cimino Camprubi-Soms, PA-C 12/30/13 [phone]

## 2013-12-30 NOTE — ED Notes (Addendum)
Pt reports bilateral leg swelling put pain behind left knee; has knee sleeve to left knee after straining it getting out of the pool. Pt is on home oxygen due to COPD, reports this is not a complaint, is hooked up to oxygen at 94%. Pt is a x 4. Speaking in clear concise sentences. Reports has been spitting up clear sputum.

## 2013-12-30 NOTE — ED Notes (Addendum)
[name] (daughter) [phone]

## 2013-12-30 NOTE — ED Notes (Signed)
Daughter called requesting status on pt.  Daughter advised that pt was in the Doppler study and we would update pt and family when test had resulted.  Daughter advised we would not write her an excuse for being in the ED with mother.

## 2014-01-06 ENCOUNTER — Emergency Department (HOSPITAL_COMMUNITY)
Admission: EM | Admit: 2014-01-06 | Discharge: 2014-01-06 | Disposition: A | Discharge status: Home / Self Care | Payer: Medicaid Other | Attending: Emergency Medicine | Admitting: Emergency Medicine

## 2014-01-06 ENCOUNTER — Encounter (HOSPITAL_COMMUNITY): Payer: Self-pay | Admitting: Emergency Medicine

## 2014-01-06 DIAGNOSIS — F319 Bipolar disorder, unspecified: Secondary | ICD-10-CM | POA: Diagnosis not present

## 2014-01-06 DIAGNOSIS — Z79899 Other long term (current) drug therapy: Secondary | ICD-10-CM | POA: Diagnosis not present

## 2014-01-06 DIAGNOSIS — T3695XA Adverse effect of unspecified systemic antibiotic, initial encounter: Secondary | ICD-10-CM | POA: Insufficient documentation

## 2014-01-06 DIAGNOSIS — R21 Rash and other nonspecific skin eruption: Secondary | ICD-10-CM | POA: Diagnosis not present

## 2014-01-06 DIAGNOSIS — Z8639 Personal history of other endocrine, nutritional and metabolic disease: Secondary | ICD-10-CM | POA: Insufficient documentation

## 2014-01-06 DIAGNOSIS — Z792 Long term (current) use of antibiotics: Secondary | ICD-10-CM | POA: Diagnosis not present

## 2014-01-06 DIAGNOSIS — Z862 Personal history of diseases of the blood and blood-forming organs and certain disorders involving the immune mechanism: Secondary | ICD-10-CM | POA: Diagnosis not present

## 2014-01-06 DIAGNOSIS — E119 Type 2 diabetes mellitus without complications: Secondary | ICD-10-CM | POA: Insufficient documentation

## 2014-01-06 DIAGNOSIS — J441 Chronic obstructive pulmonary disease with (acute) exacerbation: Secondary | ICD-10-CM | POA: Diagnosis not present

## 2014-01-06 DIAGNOSIS — Z9981 Dependence on supplemental oxygen: Secondary | ICD-10-CM | POA: Diagnosis not present

## 2014-01-06 DIAGNOSIS — F17200 Nicotine dependence, unspecified, uncomplicated: Secondary | ICD-10-CM | POA: Insufficient documentation

## 2014-01-06 DIAGNOSIS — I10 Essential (primary) hypertension: Secondary | ICD-10-CM | POA: Insufficient documentation

## 2014-01-06 DIAGNOSIS — T7840XA Allergy, unspecified, initial encounter: Secondary | ICD-10-CM

## 2014-01-06 MED ORDER — LORAZEPAM 2 MG/ML IJ SOLN
1.0000 mg | Freq: Once | INTRAMUSCULAR | Status: AC
  Administered 2014-01-06: 1 mg via INTRAVENOUS
  Filled 2014-01-06: qty 1

## 2014-01-06 MED ORDER — METHYLPREDNISOLONE SODIUM SUCC 125 MG IJ SOLR
125.0000 mg | Freq: Once | INTRAMUSCULAR | Status: AC
  Administered 2014-01-06: 125 mg via INTRAVENOUS
  Filled 2014-01-06: qty 2

## 2014-01-06 MED ORDER — DIPHENHYDRAMINE HCL 50 MG/ML IJ SOLN
25.0000 mg | Freq: Once | INTRAMUSCULAR | Status: AC
  Administered 2014-01-06: 25 mg via INTRAVENOUS
  Filled 2014-01-06: qty 1

## 2014-01-06 MED ORDER — FAMOTIDINE IN NACL 20-0.9 MG/50ML-% IV SOLN
20.0000 mg | Freq: Once | INTRAVENOUS | Status: AC
  Administered 2014-01-06: 20 mg via INTRAVENOUS
  Filled 2014-01-06: qty 50

## 2014-01-06 MED ORDER — SODIUM CHLORIDE 0.9 % IV BOLUS (SEPSIS)
1000.0000 mL | Freq: Once | INTRAVENOUS | Status: AC
Start: 2014-01-06 — End: 2014-01-06
  Administered 2014-01-06: 1000 mL via INTRAVENOUS

## 2014-01-06 NOTE — Discharge Instructions (Signed)
Return to the ED with worsening or concerning symptoms. Follow up with your doctor as needed.

## 2014-01-06 NOTE — ED Notes (Signed)
Pt states feels better; denies pain; denies shortness of breath; states ride is on their way to drive her home; states not having any shakiness at this time

## 2014-01-06 NOTE — ED Notes (Signed)
Patient states rash began last night around 8 pm on hands and now has redness, swelling, and pain all over the entire body. Patient states she is not short of breath or trouble with swallowing.

## 2014-01-06 NOTE — ED Provider Notes (Signed)
Medical screening examination/treatment/procedure(s) were performed by non-physician practitioner and as supervising physician I was immediately available for consultation/collaboration.   Dot Lanes, MD 01/06/14 [phone]

## 2014-01-06 NOTE — ED Provider Notes (Signed)
CSN: [id number]     Arrival date & time 01/06/14  9735 History   First MD Initiated Contact with Patient 01/06/14 [phone]     Chief Complaint  Patient presents with  . Allergic Reaction     (Consider location/radiation/quality/duration/timing/severity/associated sxs/prior Treatment) HPI Comments: Patient is a 58 year old female with a past medical history of bipolar disorder, hypertension, COPD, and diabetes who presents with a rash that started last night. The rash started gradually and progressively worsened since the onset. The rash is located on scalp, abdomen, back, and bilateral extremities. Patient has tried benadryl without relief. Patient reports taking bactrim and keflex for 1 week for a skin infection on her right leg. She is unsure if this caused her symptoms, as she has no previous allergy to antibiotics. Patient denies new exposures to soaps, lotions, detergent. Patient reports associated occasional itching. No aggravating/alleviating factors. Patient denies fever, chills, NVD, sore throat, oral lesions, ocular involvement, throat closing, wheezing, SOB, chest pain, abdominal pain.      Past Medical History  Diagnosis Date  . Hypertension   . Bipolar 1 disorder   . Insomnia   . Depression   . Suicidal thoughts   . COPD (chronic obstructive pulmonary disease)   . Complication of anesthesia     Hx resp distress during surgery  . Diabetes mellitus   . Hypothyroidism   . Hyperlipidemia   . Polysubstance abuse Quit 2008     H/O methamphetamine and alcohol abuse, clean x 6 years  . Tobacco abuse   . Chronic diarrhea     Followed by Dr Michail Sermon   . Shortness of breath   . MEQASTMH(962.2)    Past Surgical History  Procedure Laterality Date  . Knee surgery      Left knee tendon surgery  . Finger surgery      middle left finger  . Hernia repair      x 3  . Appendectomy    . Abdominal hysterectomy    . Tubal ligation    . Colon surgery      "colon repair"  .  Hemorrhoid surgery    . Colonoscopy with propofol N/A 12/11/2012    Procedure: COLONOSCOPY WITH PROPOFOL;  Surgeon: Jerene Bears, MD;  Location: [facility];  Service: Gastroenterology;  Laterality: N/A;   Family History  Problem Relation Age of Onset  . Multiple sclerosis Mother   . Alcoholism Father   . Depression Brother   . Emphysema Maternal Grandfather     smoked   History  Substance Use Topics  . Smoking status: Current Every Day Smoker -- 1.00 packs/day for 42 years    Types: Cigarettes  . Smokeless tobacco: Never Used  . Alcohol Use: Yes     Comment: 1-2 drinks per day   OB History   Grav Para Term Preterm Abortions TAB SAB Ect Mult Living                 Review of Systems  Constitutional: Negative for fever, chills and fatigue.  HENT: Negative for trouble swallowing.   Eyes: Negative for visual disturbance.  Respiratory: Negative for shortness of breath.   Cardiovascular: Negative for chest pain and palpitations.  Gastrointestinal: Negative for nausea, vomiting, abdominal pain and diarrhea.  Genitourinary: Negative for dysuria and difficulty urinating.  Musculoskeletal: Negative for arthralgias and neck pain.  Skin: Positive for rash. Negative for color change.  Neurological: Negative for dizziness and weakness.  Psychiatric/Behavioral: Negative for dysphoric mood.  Allergies  Diclofenac; Morphine and related; and Potassium-containing compounds  Home Medications   Prior to Admission medications   Medication Sig Start Date End Date Taking? Authorizing Provider  amitriptyline (ELAVIL) 50 MG tablet Take 200 mg by mouth at bedtime.   Yes Historical Provider, MD  amLODipine (NORVASC) 10 MG tablet Take 5 mg by mouth every morning.    Yes Historical Provider, MD  busPIRone (BUSPAR) 10 MG tablet Take 10 mg by mouth 2 (two) times daily.   Yes Historical Provider, MD  cephALEXin (KEFLEX) 500 MG capsule 2 caps po bid x 7 days 12/30/13  Yes Mercedes Strupp  Camprubi-Soms, PA-C  divalproex (DEPAKOTE) 500 MG DR tablet Take 500-1,000 mg by mouth 2 (two) times daily. *Takes 500mg  in the morning and 1000mg  at bedtime*   Yes Historical Provider, MD  gabapentin (NEURONTIN) 300 MG capsule Take 300 mg by mouth 3 (three) times daily.   Yes Historical Provider, MD  loratadine (CLARITIN) 10 MG tablet Take 10 mg by mouth daily.   Yes Historical Provider, MD  metFORMIN (GLUCOPHAGE) 850 MG tablet Take 850 mg by mouth 2 (two) times daily with a meal.   Yes Historical Provider, MD  sertraline (ZOLOFT) 100 MG tablet Take 200 mg by mouth daily.    Yes Historical Provider, MD  sulfamethoxazole-trimethoprim (BACTRIM DS) 800-160 MG per tablet Take 1 tablet by mouth 2 (two) times daily. 12/30/13  Yes Mercedes Strupp Camprubi-Soms, PA-C  zolpidem (AMBIEN) 10 MG tablet Take 10 mg by mouth at bedtime.    Yes Historical Provider, MD  albuterol (PROVENTIL) (5 MG/ML) 0.5% nebulizer solution Take 2.5 mg by nebulization every 6 (six) hours as needed for wheezing or shortness of breath. 04/16/12   Bonnielee Haff, MD  oxyCODONE-acetaminophen (PERCOCET) 5-325 MG per tablet Take 1-2 tablets by mouth every 6 (six) hours as needed for severe pain. 12/30/13   Mercedes Strupp Camprubi-Soms, PA-C   BP 131/98  Pulse 96  Temp(Src) 99 F (37.2 C) (Oral)  Ht 5\' 5"  (1.651 m)  Wt 212 lb (96.163 kg)  BMI 35.28 kg/m2  SpO2 94% Physical Exam  Nursing note and vitals reviewed. Constitutional: She appears well-developed and well-nourished. No distress.  HENT:  Head: Normocephalic and atraumatic.  Mouth/Throat: Oropharynx is clear and moist. No oropharyngeal exudate.  No lesions in throat or mouth.   Eyes: Conjunctivae and EOM are normal.  Neck: Normal range of motion.  Cardiovascular: Normal rate and regular rhythm.  Exam reveals no gallop and no friction rub.   No murmur heard. Pulmonary/Chest: Effort normal. She has wheezes. She has no rales. She exhibits no tenderness.  Wheezing noted  throughout bilateral lung fields.   Abdominal: Soft. She exhibits no distension. There is no tenderness. There is no rebound and no guarding.  Musculoskeletal: Normal range of motion.  Bilateral hand swelling.   Neurological: She is alert.  Speech is goal-oriented. Moves limbs without ataxia.   Skin: Skin is warm and dry.  Generalized macular rash covering her scalp, abdomen, back, and bilateral extremities.   Psychiatric: She has a normal mood and affect. Her behavior is normal.    ED Course  Procedures (including critical care time) Labs Review Labs Reviewed - No data to display  Imaging Review No results found.   EKG Interpretation None      MDM   Final diagnoses:  Allergic reaction, initial encounter    9:13 AM Patient will have solumedrol, benadryl and pepcid. Vitals stable and patient afebrile. Patient has a history  of COPD and wears oxygen at home. She denies any increased SOB.   Patient feeling better and will be discharged. Vitals stable and patient afebrile.   Alvina Chou, PA-C 01/06/14 1558

## 2014-04-02 IMAGING — CR DG WRIST COMPLETE 3+V*R*
4 series · 4 of 4 positions shown · non-contrast
Comparison: None.

CLINICAL DATA: Post fall, now with diffuse right-sided wrist pain

EXAM:
RIGHT WRIST - COMPLETE 3+ VIEW

[x wrist pa right]
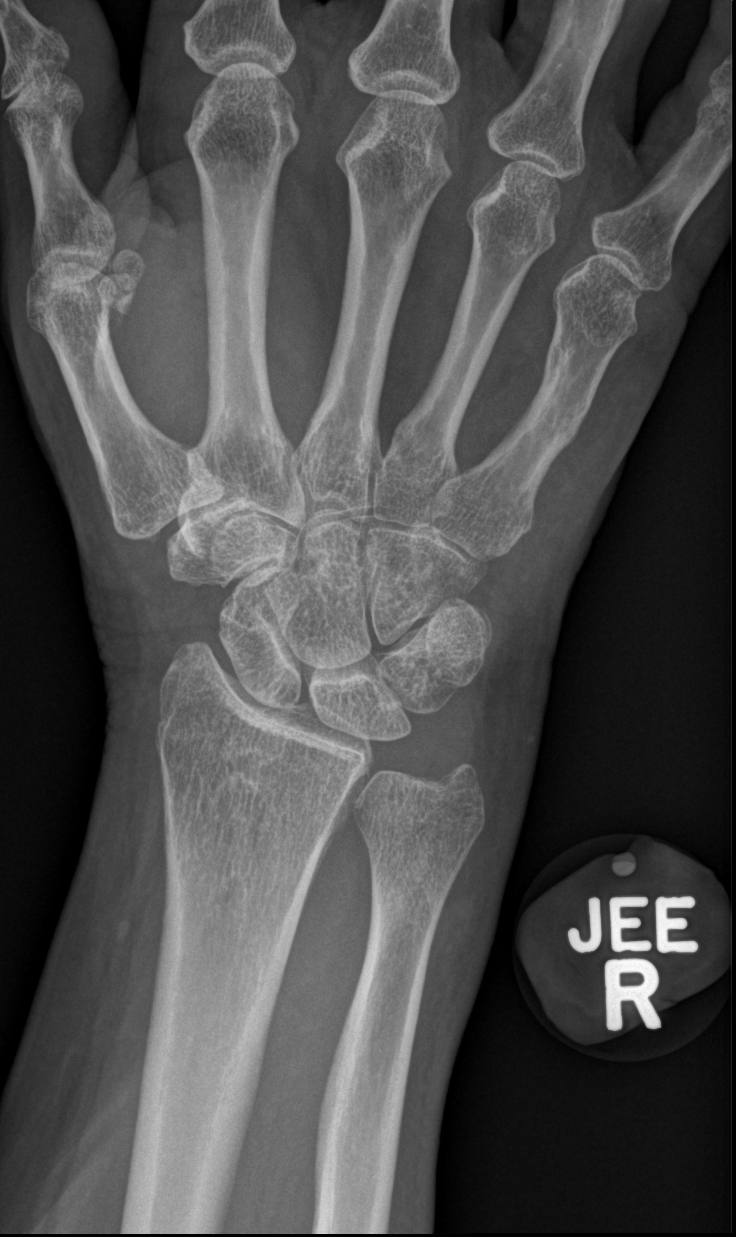

[x wrist obl right]
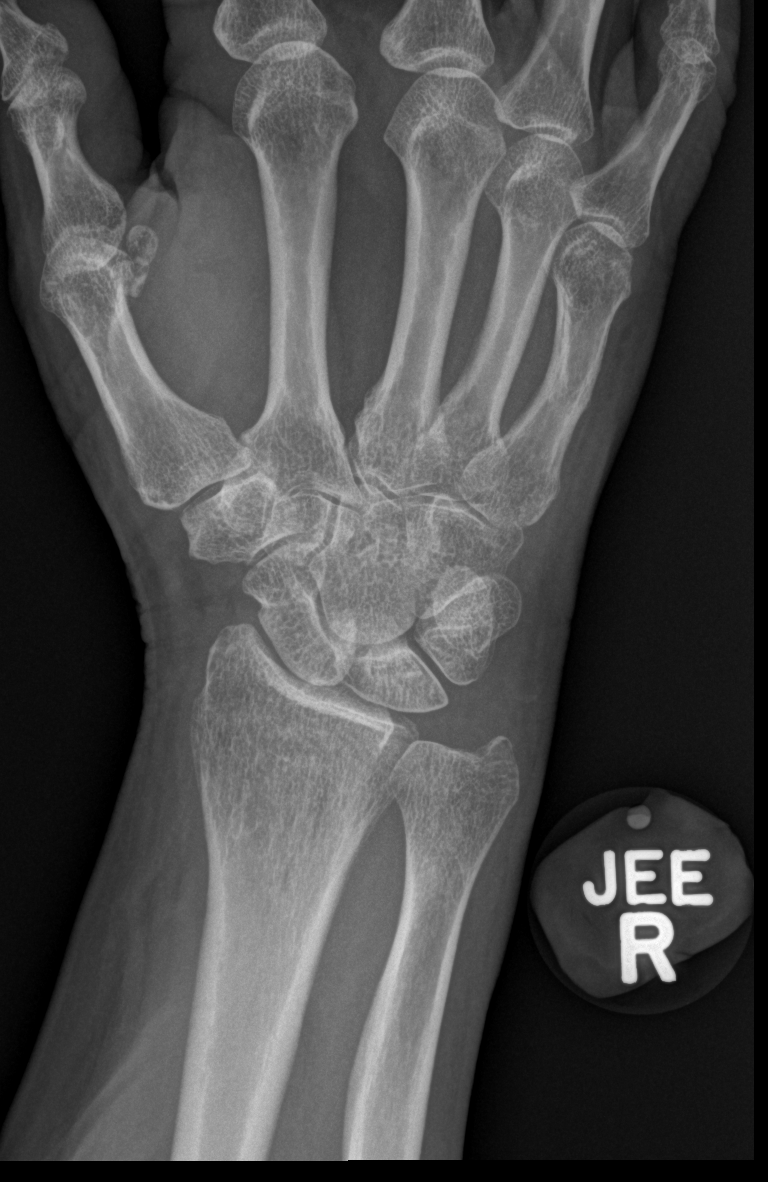

[x wrist lat right]
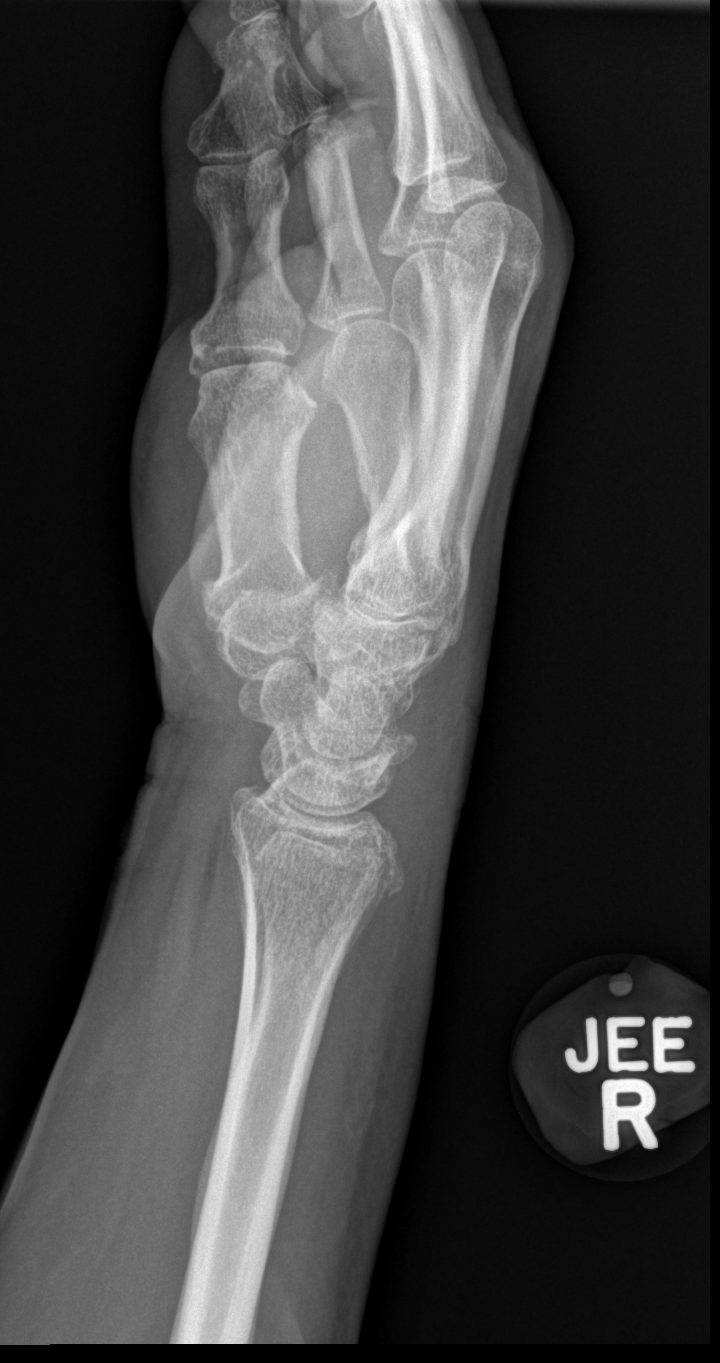

[x wrist navicular view right]
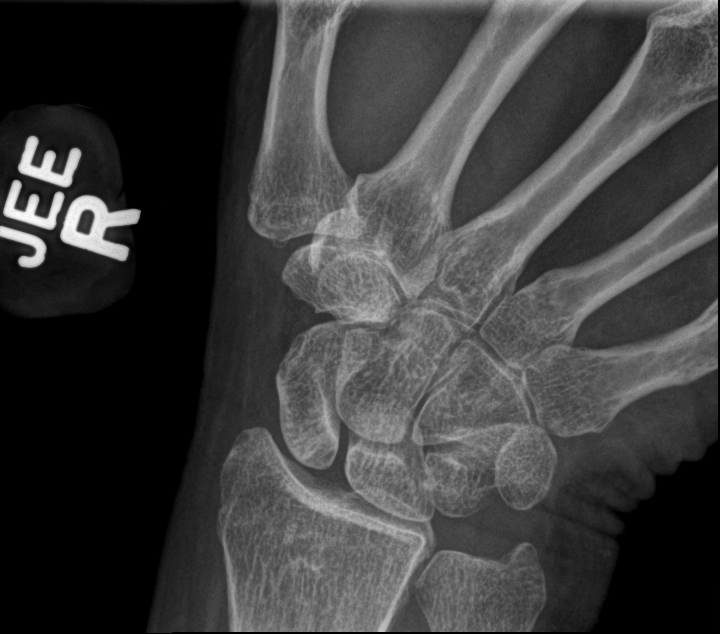

[4 of 4 positions shown; findings below may reference images not displayed]

FINDINGS: There is a potential minimally displaced avulsion fracture involving
the dorsal aspect of the distal radial epiphysis without definitive
extension to the distal radiocarpal joint. This finding this issue
with possible minimal amount of adjacent soft tissue swelling.
Otherwise, no definite displaced fractures. Joint spaces appear
preserved. No evidence of chondrocalcinosis.
IMPRESSION: Possible minimally displaced avulsion fracture involving the dorsal
aspect of the distal radial epiphysis with associated adjacent soft
tissue swelling. Correlation for point tenderness at this location
is recommended. Alternatively, if the patient has pain referable to
the anatomic snuff box, splinting and a follow-up radiograph in 10
to 14 days is recommended to evaluate for occult scaphoid fracture.

## 2016-02-25 ENCOUNTER — Emergency Department (HOSPITAL_COMMUNITY): Payer: Medicaid Other

## 2016-02-25 ENCOUNTER — Encounter (HOSPITAL_COMMUNITY): Payer: Self-pay

## 2016-02-25 ENCOUNTER — Emergency Department (HOSPITAL_COMMUNITY)
Admission: EM | Admit: 2016-02-25 | Discharge: 2016-02-25 | Disposition: A | Discharge status: Home / Self Care | Payer: Medicaid Other | Attending: Emergency Medicine | Admitting: Emergency Medicine

## 2016-02-25 DIAGNOSIS — R000 Tachycardia, unspecified: Secondary | ICD-10-CM | POA: Diagnosis not present

## 2016-02-25 DIAGNOSIS — J449 Chronic obstructive pulmonary disease, unspecified: Secondary | ICD-10-CM | POA: Insufficient documentation

## 2016-02-25 DIAGNOSIS — Z7984 Long term (current) use of oral hypoglycemic drugs: Secondary | ICD-10-CM | POA: Insufficient documentation

## 2016-02-25 DIAGNOSIS — J189 Pneumonia, unspecified organism: Secondary | ICD-10-CM | POA: Diagnosis not present

## 2016-02-25 DIAGNOSIS — R0602 Shortness of breath: Secondary | ICD-10-CM | POA: Diagnosis present

## 2016-02-25 DIAGNOSIS — F17210 Nicotine dependence, cigarettes, uncomplicated: Secondary | ICD-10-CM | POA: Diagnosis not present

## 2016-02-25 DIAGNOSIS — Z79899 Other long term (current) drug therapy: Secondary | ICD-10-CM | POA: Diagnosis not present

## 2016-02-25 DIAGNOSIS — E119 Type 2 diabetes mellitus without complications: Secondary | ICD-10-CM | POA: Insufficient documentation

## 2016-02-25 DIAGNOSIS — E039 Hypothyroidism, unspecified: Secondary | ICD-10-CM | POA: Insufficient documentation

## 2016-02-25 LAB — BASIC METABOLIC PANEL
Anion gap: 5 (ref 5–15)
BUN: 10 mg/dL (ref 6–20)
CO2: 32 mmol/L (ref 22–32)
Calcium: 8.9 mg/dL (ref 8.9–10.3)
Chloride: 102 mmol/L (ref 101–111)
Creatinine, Ser: 0.34 mg/dL — ABNORMAL LOW (ref 0.44–1.00)
GFR calc Af Amer: >60 mL/min (ref >60)
GFR calc non Af Amer: >60 mL/min (ref >60)
Glucose, Bld: 122 mg/dL — ABNORMAL HIGH (ref 65–99)
Potassium: 3.6 mmol/L (ref 3.5–5.1)
Sodium: 139 mmol/L (ref 135–145)

## 2016-02-25 LAB — CBC WITH DIFFERENTIAL/PLATELET
Basophils Absolute: 0.1 10*3/uL (ref 0.0–0.1)
Basophils Relative: 1 %
Eosinophils Absolute: 0.2 10*3/uL (ref 0.0–0.7)
Eosinophils Relative: 2 %
HCT: 41.4 % (ref 36.0–46.0)
Hemoglobin: 13.5 g/dL (ref 12.0–15.0)
Lymphocytes Relative: 20 %
Lymphs Abs: 1.7 10*3/uL (ref 0.7–4.0)
MCH: 29.3 pg (ref 26.0–34.0)
MCHC: 32.6 g/dL (ref 30.0–36.0)
MCV: 90 fL (ref 78.0–100.0)
Monocytes Absolute: 0.6 10*3/uL (ref 0.1–1.0)
Monocytes Relative: 7 %
Neutro Abs: 6.1 10*3/uL (ref 1.7–7.7)
Neutrophils Relative %: 70 %
Platelets: 178 10*3/uL (ref 150–400)
RBC: 4.6 MIL/uL (ref 3.87–5.11)
RDW: 13.8 % (ref 11.5–15.5)
WBC: 8.6 10*3/uL (ref 4.0–10.5)

## 2016-02-25 MED ORDER — IOPAMIDOL (ISOVUE-300) INJECTION 61%
75.0000 mL | Freq: Once | INTRAVENOUS | Status: AC | PRN
  Administered 2016-02-25: 75 mL via INTRAVENOUS

## 2016-02-25 MED ORDER — PROCHLORPERAZINE EDISYLATE 5 MG/ML IJ SOLN
10.0000 mg | Freq: Once | INTRAMUSCULAR | Status: AC
  Administered 2016-02-25: 10 mg via INTRAVENOUS
  Filled 2016-02-25: qty 2

## 2016-02-25 MED ORDER — DEXTROSE 5 % IV SOLN
2.0000 g | Freq: Once | INTRAVENOUS | Status: AC
  Administered 2016-02-25: 2 g via INTRAVENOUS
  Filled 2016-02-25: qty 2

## 2016-02-25 MED ORDER — PREDNISONE 50 MG PO TABS
50.0000 mg | ORAL_TABLET | Freq: Once | ORAL | Status: AC
  Administered 2016-02-25: 50 mg via ORAL
  Filled 2016-02-25: qty 1

## 2016-02-25 MED ORDER — KETOROLAC TROMETHAMINE 30 MG/ML IJ SOLN
15.0000 mg | Freq: Once | INTRAMUSCULAR | Status: AC
  Administered 2016-02-25: 15 mg via INTRAVENOUS
  Filled 2016-02-25: qty 1

## 2016-02-25 MED ORDER — AZITHROMYCIN 250 MG PO TABS: 250.0000 mg | ORAL_TABLET | Freq: Every day | ORAL | 0 refills | Status: DC

## 2016-02-25 MED ORDER — DEXTROSE 5 % IV SOLN
500.0000 mg | INTRAVENOUS | Status: DC
  Administered 2016-02-25: 500 mg via INTRAVENOUS
  Filled 2016-02-25: qty 500

## 2016-02-25 MED ORDER — AMOXICILLIN 500 MG PO CAPS: 500.0000 mg | ORAL_CAPSULE | Freq: Three times a day (TID) | ORAL | 0 refills | Status: DC

## 2016-02-25 MED ORDER — ALBUTEROL SULFATE HFA 108 (90 BASE) MCG/ACT IN AERS
2.0000 | INHALATION_SPRAY | Freq: Once | RESPIRATORY_TRACT | Status: AC
  Administered 2016-02-25: 2 via RESPIRATORY_TRACT
  Filled 2016-02-25: qty 6.7

## 2016-02-25 MED ORDER — SODIUM CHLORIDE 0.9 % IV BOLUS (SEPSIS)
1000.0000 mL | Freq: Once | INTRAVENOUS | Status: AC
  Administered 2016-02-25: 1000 mL via INTRAVENOUS

## 2016-02-25 MED ORDER — IPRATROPIUM-ALBUTEROL 0.5-2.5 (3) MG/3ML IN SOLN
3.0000 mL | Freq: Once | RESPIRATORY_TRACT | Status: AC
  Administered 2016-02-25: 3 mL via RESPIRATORY_TRACT
  Filled 2016-02-25: qty 3

## 2016-02-25 NOTE — ED Triage Notes (Signed)
EMS reports pt recently diagnosed with flu 3 weeks ago.  Reports history of copd as well.  Today c/o chest congestion and sob.  Reports 02 sat was 87% on room air when they arrived.  Reports excessive coughing.  EMS administered 2.5mg  albuterol treatment without relief so they then gave 5mg  albuterol and 0.5mg  atrovent neb.  Pt also had a neb this morning at 4am.  EMS also reports pt felt warm to touch.  EMS didn't give solumedrol because pt states is allergic to steroids.

## 2016-02-25 NOTE — ED Notes (Signed)
Pt transported to CT with Tammy.

## 2016-02-25 NOTE — ED Provider Notes (Signed)
[facility] DEPT Provider Note   CSN: [id number] Arrival date & time: 02/25/16  [id number]  By signing my name below, I, [name] [name], attest that this documentation has been prepared under the direction and in the presence of [name] Manifold, MD. Electronically Signed: Sonum [name], Education administrator. 02/25/16. 8:12 AM.  History   Chief Complaint Chief Complaint  Patient presents with  . Shortness of Breath     The history is provided by the patient. No language interpreter was used.    HPI Comments: [name] [name] is a 60 y.o. female who presents to the Emergency Department complaining of a persistent non-productive cough that began 2 weeks ago. She reports a recent subjective fever and SOB that has gradually worsened. She has tried Mucinex without significant relief. She denies leg swelling, leg pain. She is a daily smoker. She denies history of PE/DVT, cardiac or pulmonary history.   Past Medical History:  Diagnosis Date  . Bipolar 1 disorder ([place])   . Chronic diarrhea    Followed by Dr Michail Sermon   . Complication of anesthesia    Hx resp distress during surgery  . COPD (chronic obstructive pulmonary disease) ([place])   . Depression   . Diabetes mellitus ([place])   . Headache(784.0)   . Hyperlipidemia   . Hypertension   . Hypothyroidism   . Insomnia   . Polysubstance abuse Quit 2008    H/O methamphetamine and alcohol abuse, clean x 6 years  . Shortness of breath   . Suicidal thoughts   . Tobacco abuse     Patient Active Problem List   Diagnosis Date Noted  . COPD still smoking/ pft's pending  12/27/2012  . Chronic respiratory failure ([place]) 12/27/2012  . Personal history of colonic polyps 12/11/2012  . Chronic diarrhea 12/11/2012  . Benign neoplasm of colon 12/11/2012  . Migraine 12/04/2012  . Alcohol abuse 12/04/2012  . Gait instability 08/29/2012  . Tremor 08/29/2012  . Hyperlipidemia 07/08/2012  . Acute respiratory failure ([place]) 07/08/2012  . Hypothyroidism 07/08/2012  . COPD  exacerbation ([place]) 04/13/2012  . Acute-on-chronic respiratory failure ([place]) 04/13/2012  . Low back pain 04/13/2012  . Thrombocytopenia ([place]) 04/13/2012  . Diabetes mellitus ([place]) 04/13/2012  . Tobacco abuse 04/13/2012  . Hypertension   . Bipolar 1 disorder ([place])   . Insomnia   . Depression   . Suicidal thoughts     Past Surgical History:  Procedure Laterality Date  . ABDOMINAL HYSTERECTOMY    . APPENDECTOMY    . COLON SURGERY     "colon repair"  . COLONOSCOPY WITH PROPOFOL N/A 12/11/2012   Procedure: COLONOSCOPY WITH PROPOFOL;  Surgeon: Jerene Bears, MD;  Location: [facility];  Service: Gastroenterology;  Laterality: N/A;  . FINGER SURGERY     middle left finger  . HEMORRHOID SURGERY    . HERNIA REPAIR     x 3  . KNEE SURGERY     Left knee tendon surgery  . TUBAL LIGATION      OB History    No data available       Home Medications    Prior to Admission medications   Medication Sig Start Date End Date Taking? Authorizing Provider  albuterol (PROVENTIL) (5 MG/ML) 0.5% nebulizer solution Take 2.5 mg by nebulization every 6 (six) hours as needed for wheezing or shortness of breath. 04/16/12   Bonnielee Haff, MD  amitriptyline (ELAVIL) 50 MG tablet Take 200 mg by mouth at bedtime.    Historical Provider, MD  amLODipine ([place])  10 MG tablet Take 5 mg by mouth every morning.     Historical Provider, MD  busPIRone (BUSPAR) 10 MG tablet Take 10 mg by mouth 2 (two) times daily.    Historical Provider, MD  cephALEXin (KEFLEX) 500 MG capsule 2 caps po bid x 7 days 12/30/13   Mercedes Camprubi-Soms, PA-C  divalproex (DEPAKOTE) 500 MG DR tablet Take 500-1,000 mg by mouth 2 (two) times daily. *Takes 500mg  in the morning and 1000mg  at bedtime*    Historical Provider, MD  gabapentin (NEURONTIN) 300 MG capsule Take 300 mg by mouth 3 (three) times daily.    Historical Provider, MD  loratadine (CLARITIN) 10 MG tablet Take 10 mg by mouth daily.    Historical Provider, MD  metFORMIN  (GLUCOPHAGE) 850 MG tablet Take 850 mg by mouth 2 (two) times daily with a meal.    Historical Provider, MD  sertraline (ZOLOFT) 100 MG tablet Take 200 mg by mouth daily.     Historical Provider, MD  sulfamethoxazole-trimethoprim (BACTRIM DS) 800-160 MG per tablet Take 1 tablet by mouth 2 (two) times daily. 12/30/13   Mercedes Camprubi-Soms, PA-C  zolpidem (AMBIEN) 10 MG tablet Take 10 mg by mouth at bedtime.     Historical Provider, MD    Family History Family History  Problem Relation Age of Onset  . Multiple sclerosis Mother   . Alcoholism Father   . Depression Brother   . Emphysema Maternal Grandfather     smoked    Social History Social History  Substance Use Topics  . Smoking status: Current Every Day Smoker    Packs/day: 1.00    Years: 42.00    Types: Cigarettes  . Smokeless tobacco: Never Used  . Alcohol use Yes     Comment: occ     Allergies   Diclofenac; Morphine and related; and Potassium-containing compounds   Review of Systems Review of Systems  Constitutional: Positive for fever (subjective).  Respiratory: Positive for cough and shortness of breath.   Cardiovascular: Negative for leg swelling.  Musculoskeletal: Negative for myalgias.  All other systems reviewed and are negative.    Physical Exam Updated Vital Signs BP 144/92 (BP Location: Right Arm)   Pulse 110   Temp 98.1 F (36.7 C) (Oral)   Ht 5\' 5"  (1.651 m)   Wt 207 lb (93.9 kg)   SpO2 93%   BMI 34.45 kg/m   Physical Exam  Constitutional: She is oriented to person, place, and time. She appears well-developed and well-nourished. No distress.  HENT:  Head: Normocephalic and atraumatic.  Eyes: EOM are normal.  Neck: Normal range of motion.  Cardiovascular: Regular rhythm and normal heart sounds.  Tachycardia present.   Mildly tachycardic   Pulmonary/Chest: Effort normal. She has wheezes.  Expiratory wheezing bilaterally  Abdominal: Soft. She exhibits no distension. There is no  tenderness.  Musculoskeletal: Normal range of motion.  Neurological: She is alert and oriented to person, place, and time.  Skin: Skin is warm and dry.  Psychiatric: She has a normal mood and affect. Judgment normal.  Nursing note and vitals reviewed.    ED Treatments / Results  DIAGNOSTIC STUDIES: Oxygen Saturation is 93% on RA, adequate by my interpretation.    COORDINATION OF CARE: 8:15 AM Discussed treatment plan with pt at bedside and pt agreed to plan.   Labs (all labs ordered are listed, but only abnormal results are displayed) Labs Reviewed - No data to display  EKG  EKG Interpretation None  Radiology No results found.   Dg Chest 2 View  Result Date: 02/25/2016 CLINICAL DATA:  Productive cough, congestion, shortness of breath, smoking history EXAM: CHEST  2 VIEW COMPARISON:  Chest x-ray of 12/30/2013 FINDINGS: On the frontal view, the lungs appear somewhat hyperaerated. However, on the lateral view there is a nodular opacity anteriorly, not well seen on the frontal projection. This is worrisome for neoplasm, being an interval change, and CT of the chest with IV contrast media is recommended. Otherwise mediastinal and hilar contours are unremarkable and the heart is within upper limits of normal. No bony abnormality is seen. IMPRESSION: 1. Abnormal new opacity anteriorly on the lateral view in the retrosternal airspace worrisome for neoplasm. Recommend CT of the chest with IV contrast media. 2. Slight hyper aeration. Electronically Signed   By: Ivar Drape M.D.   On: 02/25/2016 08:39   Ct Chest W Contrast  Result Date: 02/25/2016 CLINICAL DATA:  Nonproductive cough for 2 weeks EXAM: CT CHEST WITH CONTRAST TECHNIQUE: Multidetector CT imaging of the chest was performed during intravenous contrast administration. CONTRAST:  74mL ISOVUE-300 IOPAMIDOL (ISOVUE-300) INJECTION 61% COMPARISON:  Plain film from earlier in the same day FINDINGS: Cardiovascular: Aortic  calcifications are noted without aneurysmal dilatation or evidence of dissection. The pulmonary artery as visualized shows no definitive pulmonary emboli. No significant coronary calcifications are seen. The cardiac structures are within normal limits. Mediastinum/Nodes: The thoracic inlet is unremarkable. Scattered small mediastinal and hilar lymph nodes are seen. These are likely reactive in nature. No axillary adenopathy is noted. Lungs/Pleura: The lungs are well aerated bilaterally. Patchy ground-glass infiltrates are seen bilaterally without nodular components. Some more marked consolidation is noted in the anterior medial aspect of the left upper lobe. This is similar to that seen on recent chest x-ray. No sizable effusion is noted. Upper Abdomen: No acute abnormality. Musculoskeletal: No chest wall abnormality. No acute or significant osseous findings. IMPRESSION: Patchy ground-glass infiltrates bilaterally with more marked consolidation in the medial aspect of the left upper lobe. Mild hilar and mediastinal lymph nodes are seen likely reactive in nature. Followup PA and lateral chest X-ray is recommended in 3-4 weeks following trial of antibiotic therapy to ensure resolution and exclude underlying malignancy. Electronically Signed   By: Inez Catalina M.D.   On: 02/25/2016 10:26    Procedures Procedures (including critical care time)  Medications Ordered in ED Medications - No data to display   Initial Impression / Assessment and Plan / ED Course  I have reviewed the triage vital signs and the nursing notes.  Pertinent labs & imaging results that were available during my care of the patient were reviewed by me and considered in my medical decision making (see chart for details).  Clinical Course    60 year old female persistent cough and shortness of breath. Imaging significant for what is likely pneumonia. I feel she is appropriate for outpatient treatment. Return precautions were  discussed.  Final Clinical Impressions(s) / ED Diagnoses   Final diagnoses:  Community acquired pneumonia, unspecified laterality    New Prescriptions New Prescriptions   No medications on file   I personally preformed the services scribed in my presence. The recorded information has been reviewed is accurate. [name] Manifold, MD.    [name] Manifold, MD 02/29/16 [phone]

## 2016-04-08 ENCOUNTER — Ambulatory Visit (HOSPITAL_COMMUNITY)
Admission: RE | Admit: 2016-04-08 | Discharge: 2016-04-08 | Disposition: A | Discharge status: Home / Self Care | Payer: Medicaid Other | Source: Ambulatory Visit | Attending: Family Medicine | Admitting: Family Medicine

## 2016-04-08 ENCOUNTER — Other Ambulatory Visit (HOSPITAL_COMMUNITY): Payer: Self-pay | Admitting: Family Medicine

## 2016-04-08 DIAGNOSIS — Z09 Encounter for follow-up examination after completed treatment for conditions other than malignant neoplasm: Secondary | ICD-10-CM | POA: Diagnosis not present

## 2016-04-08 DIAGNOSIS — R918 Other nonspecific abnormal finding of lung field: Secondary | ICD-10-CM | POA: Insufficient documentation

## 2016-04-12 ENCOUNTER — Observation Stay (HOSPITAL_COMMUNITY)
Admission: EM | Admit: 2016-04-12 | Discharge: 2016-04-13 | Disposition: A | Discharge status: Home / Self Care | Payer: Medicaid Other | Attending: Family Medicine | Admitting: Family Medicine

## 2016-04-12 ENCOUNTER — Encounter (HOSPITAL_COMMUNITY): Payer: Self-pay | Admitting: Emergency Medicine

## 2016-04-12 ENCOUNTER — Emergency Department (HOSPITAL_COMMUNITY): Payer: Medicaid Other

## 2016-04-12 DIAGNOSIS — I10 Essential (primary) hypertension: Secondary | ICD-10-CM | POA: Diagnosis present

## 2016-04-12 DIAGNOSIS — Z7984 Long term (current) use of oral hypoglycemic drugs: Secondary | ICD-10-CM | POA: Insufficient documentation

## 2016-04-12 DIAGNOSIS — R0602 Shortness of breath: Secondary | ICD-10-CM | POA: Diagnosis present

## 2016-04-12 DIAGNOSIS — J9601 Acute respiratory failure with hypoxia: Secondary | ICD-10-CM | POA: Diagnosis not present

## 2016-04-12 DIAGNOSIS — Z79899 Other long term (current) drug therapy: Secondary | ICD-10-CM | POA: Diagnosis not present

## 2016-04-12 DIAGNOSIS — M6281 Muscle weakness (generalized): Secondary | ICD-10-CM

## 2016-04-12 DIAGNOSIS — E039 Hypothyroidism, unspecified: Secondary | ICD-10-CM | POA: Diagnosis not present

## 2016-04-12 DIAGNOSIS — F319 Bipolar disorder, unspecified: Secondary | ICD-10-CM | POA: Diagnosis present

## 2016-04-12 DIAGNOSIS — E119 Type 2 diabetes mellitus without complications: Secondary | ICD-10-CM | POA: Diagnosis not present

## 2016-04-12 DIAGNOSIS — J441 Chronic obstructive pulmonary disease with (acute) exacerbation: Secondary | ICD-10-CM | POA: Diagnosis present

## 2016-04-12 DIAGNOSIS — F17210 Nicotine dependence, cigarettes, uncomplicated: Secondary | ICD-10-CM | POA: Insufficient documentation

## 2016-04-12 DIAGNOSIS — J9620 Acute and chronic respiratory failure, unspecified whether with hypoxia or hypercapnia: Secondary | ICD-10-CM | POA: Diagnosis present

## 2016-04-12 HISTORY — DX: Gastrointestinal hemorrhage, unspecified: K92.2

## 2016-04-12 HISTORY — DX: Malignant (primary) neoplasm, unspecified: C80.1

## 2016-04-12 HISTORY — DX: Anxiety disorder, unspecified: F41.9

## 2016-04-12 LAB — URINALYSIS, ROUTINE W REFLEX MICROSCOPIC
Bilirubin Urine: NEGATIVE
Glucose, UA: NEGATIVE mg/dL
HGB URINE DIPSTICK: NEGATIVE
Ketones, ur: NEGATIVE mg/dL
Leukocytes, UA: NEGATIVE
Nitrite: NEGATIVE
Protein, ur: NEGATIVE mg/dL
SPECIFIC GRAVITY, URINE: 1.015 (ref 1.005–1.030)
pH: 6.5 (ref 5.0–8.0)

## 2016-04-12 LAB — BASIC METABOLIC PANEL
ANION GAP: 8 (ref 5–15)
BUN: 11 mg/dL (ref 6–20)
CHLORIDE: 102 mmol/L (ref 101–111)
CO2: 29 mmol/L (ref 22–32)
Calcium: 9.3 mg/dL (ref 8.9–10.3)
Creatinine, Ser: 0.37 mg/dL — ABNORMAL LOW (ref 0.44–1.00)
GFR calc non Af Amer: >60 mL/min (ref >60)
Glucose, Bld: 122 mg/dL — ABNORMAL HIGH (ref 65–99)
POTASSIUM: 3.8 mmol/L (ref 3.5–5.1)
SODIUM: 139 mmol/L (ref 135–145)

## 2016-04-12 LAB — RAPID URINE DRUG SCREEN, HOSP PERFORMED
AMPHETAMINES: NOT DETECTED
Barbiturates: NOT DETECTED
Benzodiazepines: NOT DETECTED
COCAINE: NOT DETECTED
OPIATES: POSITIVE — AB
TETRAHYDROCANNABINOL: NOT DETECTED

## 2016-04-12 LAB — CBC WITH DIFFERENTIAL/PLATELET
Basophils Absolute: 0 10*3/uL (ref 0.0–0.1)
Basophils Relative: 1 %
EOS ABS: 0.2 10*3/uL (ref 0.0–0.7)
Eosinophils Relative: 2 %
HCT: 42.6 % (ref 36.0–46.0)
HEMOGLOBIN: 13.8 g/dL (ref 12.0–15.0)
LYMPHS ABS: 3 10*3/uL (ref 0.7–4.0)
LYMPHS PCT: 43 %
MCH: 29.2 pg (ref 26.0–34.0)
MCHC: 32.4 g/dL (ref 30.0–36.0)
MCV: 90.1 fL (ref 78.0–100.0)
Monocytes Absolute: 0.4 10*3/uL (ref 0.1–1.0)
Monocytes Relative: 5 %
NEUTROS ABS: 3.4 10*3/uL (ref 1.7–7.7)
NEUTROS PCT: 49 %
Platelets: 173 10*3/uL (ref 150–400)
RBC: 4.73 MIL/uL (ref 3.87–5.11)
RDW: 13.9 % (ref 11.5–15.5)
WBC: 6.9 10*3/uL (ref 4.0–10.5)

## 2016-04-12 LAB — GLUCOSE, CAPILLARY: Glucose-Capillary: 226 mg/dL — ABNORMAL HIGH (ref 65–99)

## 2016-04-12 MED ORDER — DIVALPROEX SODIUM 250 MG PO DR TAB
1000.0000 mg | DELAYED_RELEASE_TABLET | Freq: Every day | ORAL | Status: DC
  Administered 2016-04-13: 1000 mg via ORAL
  Filled 2016-04-12: qty 4

## 2016-04-12 MED ORDER — LORAZEPAM 1 MG PO TABS
1.0000 mg | ORAL_TABLET | Freq: Once | ORAL | Status: AC
  Administered 2016-04-12: 1 mg via ORAL

## 2016-04-12 MED ORDER — SODIUM CHLORIDE 0.9 % IV SOLN
INTRAVENOUS | Status: DC
  Administered 2016-04-12: 18:00:00 via INTRAVENOUS

## 2016-04-12 MED ORDER — BUSPIRONE HCL 5 MG PO TABS
10.0000 mg | ORAL_TABLET | Freq: Two times a day (BID) | ORAL | Status: DC
  Administered 2016-04-13 (×2): 10 mg via ORAL
  Filled 2016-04-12 (×2): qty 2

## 2016-04-12 MED ORDER — AMLODIPINE BESYLATE 5 MG PO TABS
5.0000 mg | ORAL_TABLET | Freq: Every day | ORAL | Status: DC
  Administered 2016-04-13: 5 mg via ORAL
  Filled 2016-04-12: qty 1

## 2016-04-12 MED ORDER — METHYLPREDNISOLONE SODIUM SUCC 125 MG IJ SOLR
125.0000 mg | Freq: Once | INTRAMUSCULAR | Status: AC
  Administered 2016-04-12: 125 mg via INTRAVENOUS
  Filled 2016-04-12: qty 2

## 2016-04-12 MED ORDER — HYDROCODONE-ACETAMINOPHEN 5-325 MG PO TABS
2.0000 | ORAL_TABLET | ORAL | Status: DC | PRN
  Administered 2016-04-13 (×2): 2 via ORAL
  Filled 2016-04-12 (×2): qty 2

## 2016-04-12 MED ORDER — ONDANSETRON HCL 4 MG PO TABS: 4.0000 mg | ORAL_TABLET | Freq: Four times a day (QID) | ORAL | Status: DC | PRN

## 2016-04-12 MED ORDER — INSULIN ASPART 100 UNIT/ML ~~LOC~~ SOLN
0.0000 [IU] | Freq: Every day | SUBCUTANEOUS | Status: DC
  Administered 2016-04-13: 2 [IU] via SUBCUTANEOUS

## 2016-04-12 MED ORDER — ACETAMINOPHEN 325 MG PO TABS: 650.0000 mg | ORAL_TABLET | Freq: Four times a day (QID) | ORAL | Status: DC | PRN

## 2016-04-12 MED ORDER — POLYETHYLENE GLYCOL 3350 17 G PO PACK: 17.0000 g | PACK | Freq: Every day | ORAL | Status: DC | PRN

## 2016-04-12 MED ORDER — ENOXAPARIN SODIUM 40 MG/0.4ML ~~LOC~~ SOLN
40.0000 mg | SUBCUTANEOUS | Status: DC
  Administered 2016-04-13: 40 mg via SUBCUTANEOUS
  Filled 2016-04-12: qty 0.4

## 2016-04-12 MED ORDER — INSULIN ASPART 100 UNIT/ML ~~LOC~~ SOLN
0.0000 [IU] | Freq: Three times a day (TID) | SUBCUTANEOUS | Status: DC
  Administered 2016-04-13: 2 [IU] via SUBCUTANEOUS
  Administered 2016-04-13: 1 [IU] via SUBCUTANEOUS

## 2016-04-12 MED ORDER — SODIUM CHLORIDE 0.9 % IV SOLN: 250.0000 mL | INTRAVENOUS | Status: DC | PRN

## 2016-04-12 MED ORDER — DIVALPROEX SODIUM 250 MG PO DR TAB
500.0000 mg | DELAYED_RELEASE_TABLET | Freq: Every day | ORAL | Status: DC
  Administered 2016-04-13: 500 mg via ORAL
  Filled 2016-04-12: qty 2

## 2016-04-12 MED ORDER — IPRATROPIUM-ALBUTEROL 0.5-2.5 (3) MG/3ML IN SOLN
3.0000 mL | RESPIRATORY_TRACT | Status: DC | PRN
  Administered 2016-04-13: 3 mL via RESPIRATORY_TRACT
  Filled 2016-04-12: qty 3

## 2016-04-12 MED ORDER — SODIUM CHLORIDE 0.9% FLUSH
3.0000 mL | Freq: Two times a day (BID) | INTRAVENOUS | Status: DC
  Administered 2016-04-13 (×2): 3 mL via INTRAVENOUS

## 2016-04-12 MED ORDER — TRAZODONE HCL 50 MG PO TABS
50.0000 mg | ORAL_TABLET | Freq: Every day | ORAL | Status: DC
  Administered 2016-04-13: 100 mg via ORAL
  Filled 2016-04-12: qty 2

## 2016-04-12 MED ORDER — MAGNESIUM SULFATE 2 GM/50ML IV SOLN
2.0000 g | Freq: Once | INTRAVENOUS | Status: AC
  Administered 2016-04-12: 2 g via INTRAVENOUS
  Filled 2016-04-12: qty 50

## 2016-04-12 MED ORDER — ONDANSETRON HCL 4 MG/2ML IJ SOLN: 4.0000 mg | Freq: Four times a day (QID) | INTRAMUSCULAR | Status: DC | PRN

## 2016-04-12 MED ORDER — ACETAMINOPHEN 650 MG RE SUPP: 650.0000 mg | Freq: Four times a day (QID) | RECTAL | Status: DC | PRN

## 2016-04-12 MED ORDER — ALBUTEROL SULFATE (2.5 MG/3ML) 0.083% IN NEBU
INHALATION_SOLUTION | RESPIRATORY_TRACT | Status: AC
  Administered 2016-04-12: 15 mg
  Filled 2016-04-12: qty 18

## 2016-04-12 MED ORDER — METHYLPREDNISOLONE SODIUM SUCC 125 MG IJ SOLR
60.0000 mg | Freq: Three times a day (TID) | INTRAMUSCULAR | Status: DC
  Administered 2016-04-13 (×2): 60 mg via INTRAVENOUS
  Filled 2016-04-12 (×2): qty 2

## 2016-04-12 MED ORDER — SODIUM CHLORIDE 0.9% FLUSH: 3.0000 mL | INTRAVENOUS | Status: DC | PRN

## 2016-04-12 MED ORDER — AMITRIPTYLINE HCL 25 MG PO TABS
200.0000 mg | ORAL_TABLET | Freq: Every day | ORAL | Status: DC
  Administered 2016-04-13: 200 mg via ORAL
  Filled 2016-04-12: qty 8

## 2016-04-12 MED ORDER — SERTRALINE HCL 50 MG PO TABS
200.0000 mg | ORAL_TABLET | Freq: Every day | ORAL | Status: DC
  Administered 2016-04-13: 200 mg via ORAL
  Filled 2016-04-12: qty 4

## 2016-04-12 MED ORDER — DIVALPROEX SODIUM 250 MG PO DR TAB: 500.0000 mg | DELAYED_RELEASE_TABLET | Freq: Two times a day (BID) | ORAL | Status: DC

## 2016-04-12 MED ORDER — BENZTROPINE MESYLATE 1 MG PO TABS
0.5000 mg | ORAL_TABLET | Freq: Every day | ORAL | Status: DC
  Administered 2016-04-13: 0.5 mg via ORAL
  Filled 2016-04-12: qty 1

## 2016-04-12 MED ORDER — ADULT MULTIVITAMIN W/MINERALS CH
1.0000 | ORAL_TABLET | Freq: Every day | ORAL | Status: DC
  Administered 2016-04-13: 1 via ORAL
  Filled 2016-04-12: qty 1

## 2016-04-12 MED ORDER — LEVOFLOXACIN IN D5W 750 MG/150ML IV SOLN
750.0000 mg | INTRAVENOUS | Status: DC
  Administered 2016-04-13: 750 mg via INTRAVENOUS
  Filled 2016-04-12: qty 150

## 2016-04-12 MED ORDER — ZOLPIDEM TARTRATE 5 MG PO TABS
5.0000 mg | ORAL_TABLET | Freq: Every evening | ORAL | Status: DC | PRN
  Administered 2016-04-13: 5 mg via ORAL
  Filled 2016-04-12: qty 1

## 2016-04-12 MED ORDER — LORAZEPAM 1 MG PO TABS
ORAL_TABLET | ORAL | Status: AC
  Administered 2016-04-12: 1 mg via ORAL
  Filled 2016-04-12: qty 1

## 2016-04-12 MED ORDER — PRAVASTATIN SODIUM 10 MG PO TABS
20.0000 mg | ORAL_TABLET | Freq: Every day | ORAL | Status: DC
  Administered 2016-04-13: 20 mg via ORAL
  Filled 2016-04-12: qty 2
  Filled 2016-04-12 (×3): qty 1

## 2016-04-12 MED ORDER — ALBUTEROL (5 MG/ML) CONTINUOUS INHALATION SOLN
15.0000 mg/h | INHALATION_SOLUTION | Freq: Once | RESPIRATORY_TRACT | Status: DC
  Filled 2016-04-12: qty 60

## 2016-04-12 NOTE — ED Triage Notes (Signed)
Pt seen on oct 5th and dx with pneumonia, given antibiotics, followed up with PCP and they wanted her to have follow up x-rays.  Pt had follow up xrays appx 1 week ago.  Pt has began to have worsening sob over the past week with exertion.  Pt has been coughing up thick yellow sputum, denies fever.  Pt alert and oriented.

## 2016-04-12 NOTE — ED Notes (Signed)
Pt was offered wheelchair back to room, pt refused and wanted to walk.  Pt started wheezing, placed in bed and 02 sat was 80%.  Pt placed on 2L 02 [place] and pt 02 sats increased to 94%.  Dr. Eulis Foster notified and is at bedside at this time.

## 2016-04-12 NOTE — ED Notes (Signed)
Patient became very anxious and jittery after Albuterol treatments. Vitals WNL. Dr. Eulis Foster notified. Ativan PO given as ordered. Patient up to restroom with one assist without difficulty. Patient's urine dark in color, Dr Eulis Foster notified. Urine sample ordered and collected.

## 2016-04-12 NOTE — H&P (Signed)
History and Physical    [name] [name] [id number] DOB: 04/29/1956 DOA: 04/12/2016  PCP: Leonard Downing, MD   Patient coming from: Home  Chief Complaint: Dyspnea, cough  HPI: [name] [name] is a 60 y.o. female with medical history significant for COPD, ongoing tobacco abuse, hypertension, bipolar disorder, and type 2 diabetes mellitus who presents the emergency department with progressive exertional dyspnea and cough productive of thick yellow and green sputum. Patient reports similar symptoms earlier this month for which she was seen in the emergency department on the fifth, diagnosed with pneumonia, and discharged home from the ED with antibiotics. She reports making a full recovery from that episode before the insidious redevelopment of similar symptoms over the past week. She reports worsening dyspnea with exertion and a cough which is productive of thick yellow and green sputum. She denies any lower extremity edema, orthopnea, chest pain, or palpitations. There has been no fevers or chills. She denies any sick contacts or recent long distance travel. She reports that her symptoms have been progressively worsening despite her use of albuterol at home. Today, she is dyspneic at rest and presents for evaluation of this. She does not use supplemental oxygen at home.   ED Course: Upon arrival to the ED, patient is found to be afebrile, saturating in the 80s on room air, and with vitals otherwise stable. Chest x-ray is negative for acute cardiopulmonary disease, chemistry panel is unremarkable, CBC is entirely within the normal limits, and urinalysis appears normal. Patient was treated with nebs, 125 mg IV Solu-Medrol, and 2 g of IV magnesium in the emergency department. She made significant improvement with these measures, but continues to become hypoxic on room air, even while at rest, and will be observed on the medical-surgical unit for ongoing evaluation and management of acute  exacerbation and COPD with acute hypoxic respiratory failure.  Review of Systems:  All other systems reviewed and apart from HPI, are negative.  Past Medical History:  Diagnosis Date  . Bipolar 1 disorder ([place])   . Chronic diarrhea    Followed by Dr Michail Sermon   . Complication of anesthesia    Hx resp distress during surgery  . COPD (chronic obstructive pulmonary disease) ([place])   . Depression   . Diabetes mellitus ([place])   . Headache(784.0)   . Hyperlipidemia   . Hypertension   . Hypothyroidism   . Insomnia   . Polysubstance abuse Quit 2008    H/O methamphetamine and alcohol abuse, clean x 6 years  . Shortness of breath   . Suicidal thoughts   . Tobacco abuse     Past Surgical History:  Procedure Laterality Date  . ABDOMINAL HYSTERECTOMY    . APPENDECTOMY    . COLON SURGERY     "colon repair"  . COLONOSCOPY WITH PROPOFOL N/A 12/11/2012   Procedure: COLONOSCOPY WITH PROPOFOL;  Surgeon: Jerene Bears, MD;  Location: [facility];  Service: Gastroenterology;  Laterality: N/A;  . FINGER SURGERY     middle left finger  . HEMORRHOID SURGERY    . HERNIA REPAIR     x 3  . KNEE SURGERY     Left knee tendon surgery  . TUBAL LIGATION       reports that she has been smoking Cigarettes.  She has a 42.00 pack-year smoking history. She has never used smokeless tobacco. She reports that she drinks alcohol. She reports that she does not use drugs.  Allergies  Allergen Reactions  . Diclofenac Tinitus  swelling in feet  . Morphine And Related Nausea And Vomiting and Other (See Comments)    headache  . Potassium-Containing Compounds Itching    Family History  Problem Relation Age of Onset  . Multiple sclerosis Mother   . Alcoholism Father   . Depression Brother   . Emphysema Maternal Grandfather     smoked     Prior to Admission medications   Medication Sig Start Date End Date Taking? Authorizing Provider  albuterol (PROVENTIL) (5 MG/ML) 0.5% nebulizer solution Take 2.5  mg by nebulization every 6 (six) hours as needed for wheezing or shortness of breath. 04/16/12  Yes Bonnielee Haff, MD  amitriptyline (ELAVIL) 100 MG tablet Take 200 mg by mouth at bedtime.   Yes Historical Provider, MD  amLODipine (NORVASC) 5 MG tablet Take 5 mg by mouth daily.   Yes Historical Provider, MD  benztropine (COGENTIN) 0.5 MG tablet Take 0.5 mg by mouth at bedtime.   Yes Historical Provider, MD  busPIRone (BUSPAR) 10 MG tablet Take 10 mg by mouth 2 (two) times daily.   Yes Historical Provider, MD  divalproex (DEPAKOTE) 500 MG DR tablet Take 500 mg by mouth 2 (two) times daily. *Takes 500mg  in the morning and 1000mg  at bedtime*    Yes Historical Provider, MD  HYDROcodone-acetaminophen (NORCO/VICODIN) 5-325 MG tablet Take 2 tablets by mouth every 4 (four) hours as needed for moderate pain.   Yes Historical Provider, MD  lovastatin (MEVACOR) 20 MG tablet Take 20 mg by mouth daily at 6 PM.   Yes Historical Provider, MD  metFORMIN (GLUCOPHAGE) 850 MG tablet Take 850 mg by mouth 2 (two) times daily with a meal.   Yes Historical Provider, MD  Multiple Vitamins-Minerals (MULTIVITAMINS THER. W/MINERALS) TABS tablet Take 1 tablet by mouth daily.   Yes Historical Provider, MD  sertraline (ZOLOFT) 100 MG tablet Take 200 mg by mouth daily.    Yes Historical Provider, MD  traZODone (DESYREL) 50 MG tablet Take 50-100 mg by mouth at bedtime.   Yes Historical Provider, MD  amoxicillin (AMOXIL) 500 MG capsule Take 1 capsule (500 mg total) by mouth 3 (three) times daily. Patient not taking: Reported on 04/12/2016 02/25/16   Virgel Manifold, MD  azithromycin (ZITHROMAX) 250 MG tablet Take 1 tablet (250 mg total) by mouth daily. Take one tablet daily Patient not taking: Reported on 04/12/2016 02/25/16   Virgel Manifold, MD    Physical Exam: Vitals:   04/12/16 1800 04/12/16 1830 04/12/16 1900 04/12/16 1930  BP: (!) 123/54 142/73 128/58 129/81  Pulse: 97 100 93 96  Resp:  22 24 22   Temp:      TempSrc:        SpO2: 95% (!) 88% (!) 86% (!) 83%  Weight:      Height:          Constitutional: mild respiratory distress with accessory muscle recruitment, no pallor or cyanosis Eyes: PERTLA, lids and conjunctivae normal ENMT: Mucous membranes are moist. Posterior pharynx clear of any exudate or lesions.   Neck: normal, supple, no masses, no thyromegaly Respiratory: Diminished bilaterally with scattered wheezes. Increased WOB.  Cardiovascular: S1 & S2 heard, regular rate and rhythm. No extremity edema. 2+ pedal pulses. No significant JVD. Abdomen: No distension, no tenderness, no masses palpated. Bowel sounds normal.  Musculoskeletal: no clubbing / cyanosis. No joint deformity upper and lower extremities. Normal muscle tone.  Skin: no significant rashes, lesions, ulcers. Warm, dry, well-perfused. Neurologic: CN 2-12 grossly intact. Sensation intact, DTR normal. Strength  5/5 in all 4 limbs.  Psychiatric: Normal judgment and insight. Alert and oriented x 3. Anxious.     Labs on Admission: I have personally reviewed following labs and imaging studies  CBC:  Recent Labs Lab 04/12/16 1549  WBC 6.9  NEUTROABS 3.4  HGB 13.8  HCT 42.6  MCV 90.1  PLT [id number]   Basic Metabolic Panel:  Recent Labs Lab 04/12/16 1549  NA 139  K 3.8  CL 102  CO2 29  GLUCOSE 122*  BUN 11  CREATININE 0.37*  CALCIUM 9.3   GFR: Estimated Creatinine Clearance: 83.4 mL/min (by C-G formula based on SCr of 0.37 mg/dL (L)). Liver Function Tests: No results for input(s): AST, ALT, ALKPHOS, BILITOT, PROT, ALBUMIN in the last 168 hours. No results for input(s): LIPASE, AMYLASE in the last 168 hours. No results for input(s): AMMONIA in the last 168 hours. Coagulation Profile: No results for input(s): INR, PROTIME in the last 168 hours. Cardiac Enzymes: No results for input(s): CKTOTAL, CKMB, CKMBINDEX, TROPONINI in the last 168 hours. BNP (last 3 results) No results for input(s): PROBNP in the last 8760  hours. HbA1C: No results for input(s): HGBA1C in the last 72 hours. CBG: No results for input(s): GLUCAP in the last 168 hours. Lipid Profile: No results for input(s): CHOL, HDL, LDLCALC, TRIG, CHOLHDL, LDLDIRECT in the last 72 hours. Thyroid Function Tests: No results for input(s): TSH, T4TOTAL, FREET4, T3FREE, THYROIDAB in the last 72 hours. Anemia Panel: No results for input(s): VITAMINB12, FOLATE, FERRITIN, TIBC, IRON, RETICCTPCT in the last 72 hours. Urine analysis:    Component Value Date/Time   COLORURINE YELLOW 04/12/2016 [place] 04/12/2016 1835   LABSPEC 1.015 04/12/2016 1835   PHURINE 6.5 04/12/2016 1835   GLUCOSEU NEGATIVE 04/12/2016 1835   HGBUR NEGATIVE 04/12/2016 1835   BILIRUBINUR NEGATIVE 04/12/2016 1835   KETONESUR NEGATIVE 04/12/2016 1835   PROTEINUR NEGATIVE 04/12/2016 1835   UROBILINOGEN 1.0 10/31/2012 1157   NITRITE NEGATIVE 04/12/2016 1835   LEUKOCYTESUR NEGATIVE 04/12/2016 1835   Sepsis Labs: @LABRCNTIP (procalcitonin:4,lacticidven:4) )No results found for this or any previous visit (from the past 240 hour(s)).   Radiological Exams on Admission: Dg Chest 2 View  Result Date: 04/12/2016 CLINICAL DATA:  Wet and dry cough for 1 month EXAM: CHEST  2 VIEW COMPARISON:  04/08/2016, 02/25/2016 FINDINGS: Mild lingular opacity is unchanged. No acute consolidation or effusion. Stable borderline cardiomegaly. No overt edema. No pneumothorax. IMPRESSION: No significant interval change.  No interval consolidation. Electronically Signed   By: Donavan Foil M.D.   On: 04/12/2016 14:53    EKG: Not performed, will obtain as appropriate.   Assessment/Plan  1. COPD with acute exacerbation, acute hypoxic respiratory failure   - Pt presents with worsening dyspnea and productive cough despite her use of home neb treatments - She improved with nebs, IV steroid, and IV mag in ED, but continues to be hypoxic at rest  - Plan to continue neb treatments, IV  Solu-Medrol, supplemental O2; check sputum culture and gram stain and start Levaquin  - She will likely need home O2  2. Bipolar disorder, insomnia  - Pt reports this to be stable and denies SI, HI, or hallucinations  - Continue Depakote, Zoloft, Buspar, trazodone, Cogentin, and Elavil  3. Type II DM  - A1c was 6.8% in 2014 - She is managed with metformin only at home; this will be held in the hospital  - Check CBG with meals and qHS; start a low-intensity SSI and  adjust prn    4. Hypertension  - Currently at goal, will continue Norvasc as tolerated   DVT prophylaxis: sq Lovenox  Code Status: Full  Family Communication: Discussed with patient Disposition Plan: Observe on med-surg Consults called: None Admission status: Observation    Vianne Bulls, MD [facility] Pager [phone]  If 7PM-7AM, please contact night-coverage [email or web address] Password [email or web address]  04/12/2016, 8:02 PM

## 2016-04-12 NOTE — ED Provider Notes (Signed)
[facility] DEPT Provider Note   CSN: [id number] Arrival date & time: 04/12/16  1427     History   Chief Complaint Chief Complaint  Patient presents with  . Shortness of Breath    HPI [name] [name] is a 60 y.o. female.  She presents for evaluation of cough for 1 week, gradually worse, now accompanied with dyspnea upon exertion. She was treated for pneumonia several weeks ago, and followed up with her PCP for recheck imaging, last week. She denies fever, chills, nausea, vomiting, weakness or dizziness. She continues to smoke cigarettes. She is a long history of COPD, requiring treatment and in the past used oxygen but stopped using it 3 years ago, because of the inconvenience. There are no other known modifying factors.  HPI  Past Medical History:  Diagnosis Date  . Bipolar 1 disorder ([place])   . Chronic diarrhea    Followed by Dr Michail Sermon   . Complication of anesthesia    Hx resp distress during surgery  . COPD (chronic obstructive pulmonary disease) ([place])   . Depression   . Diabetes mellitus ([place])   . Headache(784.0)   . Hyperlipidemia   . Hypertension   . Hypothyroidism   . Insomnia   . Polysubstance abuse Quit 2008    H/O methamphetamine and alcohol abuse, clean x 6 years  . Shortness of breath   . Suicidal thoughts   . Tobacco abuse     Patient Active Problem List   Diagnosis Date Noted  . COPD still smoking/ pft's pending  12/27/2012  . Chronic respiratory failure ([place]) 12/27/2012  . Personal history of colonic polyps 12/11/2012  . Chronic diarrhea 12/11/2012  . Benign neoplasm of colon 12/11/2012  . Migraine 12/04/2012  . Alcohol abuse 12/04/2012  . Gait instability 08/29/2012  . Tremor 08/29/2012  . Hyperlipidemia 07/08/2012  . Acute respiratory failure ([place]) 07/08/2012  . Hypothyroidism 07/08/2012  . COPD exacerbation ([place]) 04/13/2012  . Acute-on-chronic respiratory failure ([place]) 04/13/2012  . Low back pain 04/13/2012  . Thrombocytopenia  ([place]) 04/13/2012  . Diabetes mellitus ([place]) 04/13/2012  . Tobacco abuse 04/13/2012  . Hypertension   . Bipolar 1 disorder ([place])   . Insomnia   . Depression   . Suicidal thoughts     Past Surgical History:  Procedure Laterality Date  . ABDOMINAL HYSTERECTOMY    . APPENDECTOMY    . COLON SURGERY     "colon repair"  . COLONOSCOPY WITH PROPOFOL N/A 12/11/2012   Procedure: COLONOSCOPY WITH PROPOFOL;  Surgeon: Jerene Bears, MD;  Location: [facility] END[facility];  Service: Gastroenterology;  Laterality: N/A;  . FINGER SURGERY     middle left finger  . HEMORRHOID SURGERY    . HERNIA REPAIR     x 3  . KNEE SURGERY     Left knee tendon surgery  . TUBAL LIGATION      OB History    No data available       Home Medications    Prior to Admission medications   Medication Sig Start Date End Date Taking? Authorizing Provider  albuterol (PROVENTIL) (5 MG/ML) 0.5% nebulizer solution Take 2.5 mg by nebulization every 6 (six) hours as needed for wheezing or shortness of breath. 04/16/12  Yes Bonnielee Haff, MD  amitriptyline (ELAVIL) 100 MG tablet Take 200 mg by mouth at bedtime.   Yes Historical Provider, MD  amLODipine (NORVASC) 5 MG tablet Take 5 mg by mouth daily.   Yes Historical Provider, MD  benztropine (COGENTIN) 0.5 MG  tablet Take 0.5 mg by mouth at bedtime.   Yes Historical Provider, MD  busPIRone (BUSPAR) 10 MG tablet Take 10 mg by mouth 2 (two) times daily.   Yes Historical Provider, MD  divalproex (DEPAKOTE) 500 MG DR tablet Take 500 mg by mouth 2 (two) times daily. *Takes 500mg  in the morning and 1000mg  at bedtime*    Yes Historical Provider, MD  HYDROcodone-acetaminophen (NORCO/VICODIN) 5-325 MG tablet Take 2 tablets by mouth every 4 (four) hours as needed for moderate pain.   Yes Historical Provider, MD  lovastatin (MEVACOR) 20 MG tablet Take 20 mg by mouth daily at 6 PM.   Yes Historical Provider, MD  metFORMIN (GLUCOPHAGE) 850 MG tablet Take 850 mg by mouth 2 (two) times daily with a  meal.   Yes Historical Provider, MD  Multiple Vitamins-Minerals (MULTIVITAMINS THER. W/MINERALS) TABS tablet Take 1 tablet by mouth daily.   Yes Historical Provider, MD  sertraline (ZOLOFT) 100 MG tablet Take 200 mg by mouth daily.    Yes Historical Provider, MD  traZODone (DESYREL) 50 MG tablet Take 50-100 mg by mouth at bedtime.   Yes Historical Provider, MD  amoxicillin (AMOXIL) 500 MG capsule Take 1 capsule (500 mg total) by mouth 3 (three) times daily. Patient not taking: Reported on 04/12/2016 02/25/16   Virgel Manifold, MD  azithromycin (ZITHROMAX) 250 MG tablet Take 1 tablet (250 mg total) by mouth daily. Take one tablet daily Patient not taking: Reported on 04/12/2016 02/25/16   Virgel Manifold, MD    Family History Family History  Problem Relation Age of Onset  . Multiple sclerosis Mother   . Alcoholism Father   . Depression Brother   . Emphysema Maternal Grandfather     smoked    Social History Social History  Substance Use Topics  . Smoking status: Current Every Day Smoker    Packs/day: 1.00    Years: 42.00    Types: Cigarettes  . Smokeless tobacco: Never Used  . Alcohol use Yes     Comment: occ     Allergies   Diclofenac; Morphine and related; and Potassium-containing compounds   Review of Systems Review of Systems  All other systems reviewed and are negative.    Physical Exam Updated Vital Signs BP 142/73   Pulse 100   Temp 97.8 F (36.6 C) (Oral)   Resp 22   Ht 5\' 5"  (1.651 m)   Wt 196 lb (88.9 kg)   SpO2 (!) 88%   BMI 32.62 kg/m   Physical Exam  Constitutional: She is oriented to person, place, and time. She appears well-developed.  Appears older than stated age.  HENT:  Head: Normocephalic and atraumatic.  Eyes: Conjunctivae and EOM are normal. Pupils are equal, round, and reactive to light.  Neck: Normal range of motion and phonation normal. Neck supple.  Cardiovascular: Normal rate and regular rhythm.   Pulmonary/Chest: She exhibits no  tenderness.  No tachypnea. No increased work of breathing. Poor air movement bilaterally with generalized wheezes. Scattered rhonchi are present.  Abdominal: Soft. She exhibits no distension. There is no tenderness. There is no guarding.  Musculoskeletal: Normal range of motion. She exhibits edema (1+ bilateral lower legs).  Neurological: She is alert and oriented to person, place, and time. She exhibits normal muscle tone.  Skin: Skin is warm and dry.  Psychiatric: She has a normal mood and affect. Her behavior is normal. Judgment and thought content normal.  Nursing note and vitals reviewed.    ED Treatments /  Results  Labs (all labs ordered are listed, but only abnormal results are displayed) Labs Reviewed  BASIC METABOLIC PANEL - Abnormal; Notable for the following:       Result Value   Glucose, Bld 122 (*)    Creatinine, Ser 0.37 (*)    All other components within normal limits  URINE CULTURE  CBC WITH DIFFERENTIAL/PLATELET  URINALYSIS, ROUTINE W REFLEX MICROSCOPIC (NOT AT [facility])    EKG  EKG Interpretation None       Radiology Dg Chest 2 View  Result Date: 04/12/2016 CLINICAL DATA:  Wet and dry cough for 1 month EXAM: CHEST  2 VIEW COMPARISON:  04/08/2016, 02/25/2016 FINDINGS: Mild lingular opacity is unchanged. No acute consolidation or effusion. Stable borderline cardiomegaly. No overt edema. No pneumothorax. IMPRESSION: No significant interval change.  No interval consolidation. Electronically Signed   By: Donavan Foil M.D.   On: 04/12/2016 14:53    Procedures Procedures (including critical care time)  Medications Ordered in ED Medications  albuterol (PROVENTIL,VENTOLIN) solution continuous neb (15 mg/hr Nebulization Not Given 04/12/16 1715)  0.9 %  sodium chloride infusion ( Intravenous New Bag/Given 04/12/16 1733)  magnesium sulfate IVPB 2 g 50 mL (0 g Intravenous Stopped 04/12/16 1835)  methylPREDNISolone sodium succinate (SOLU-MEDROL) 125 mg/2 mL injection  125 mg (125 mg Intravenous Given 04/12/16 1726)  albuterol (PROVENTIL) (2.5 MG/3ML) 0.083% nebulizer solution (15 mg  Given 04/12/16 1710)  LORazepam (ATIVAN) tablet 1 mg (1 mg Oral Given 04/12/16 1830)     Initial Impression / Assessment and Plan / ED Course  I have reviewed the triage vital signs and the nursing notes.  Pertinent labs & imaging results that were available during my care of the patient were reviewed by me and considered in my medical decision making (see chart for details).  Clinical Course     Medications  albuterol (PROVENTIL,VENTOLIN) solution continuous neb (15 mg/hr Nebulization Not Given 04/12/16 1715)  0.9 %  sodium chloride infusion ( Intravenous New Bag/Given 04/12/16 1733)  magnesium sulfate IVPB 2 g 50 mL (0 g Intravenous Stopped 04/12/16 1835)  methylPREDNISolone sodium succinate (SOLU-MEDROL) 125 mg/2 mL injection 125 mg (125 mg Intravenous Given 04/12/16 1726)  albuterol (PROVENTIL) (2.5 MG/3ML) 0.083% nebulizer solution (15 mg  Given 04/12/16 1710)  LORazepam (ATIVAN) tablet 1 mg (1 mg Oral Given 04/12/16 1830)    Patient Vitals for the past 24 hrs:  BP Temp Temp src Pulse Resp SpO2 Height Weight  04/12/16 1830 142/73 - - 100 22 (!) 88 % - -  04/12/16 1800 (!) 123/54 - - 97 - 95 % - -  04/12/16 1735 126/76 - - 86 - 97 % - -  04/12/16 1713 - - - - - 93 % - -  04/12/16 1700 130/79 - - 80 - 93 % - -  04/12/16 1434 149/88 97.8 F (36.6 C) Oral 97 22 92 % 5\' 5"  (1.651 m) 196 lb (88.9 kg)    7:15 PM Reevaluation with update and discussion. After initial assessment and treatment, an updated evaluation reveals she is feeling better, improved breathing, and feels like the Ativan has improved her nervousness. Oxygen saturation, on 2 L nasal cannula, 94%. Daleen Bo L   Oxygen Dependency Trial- nasal cannula oxygen removed by me, at 19:23 evaluated at 19:25, oxygen saturation on room air 84%. Patient noted to have pursed lip breathing at this time. I  asked her to breathe deeply, for 15 seconds and her oxygen saturation improved to 91%. Transiently, then  within 3 minutes, decreased to 86%. The sac continued to drop to 84%, and she was placed back on oxygen by nasal cannula with improvement of the oxygen saturation to greater than 90%. This indicates oxygen dependency, at this time.   7:38 PM-Consult complete with Hospitalist. Patient case explained and discussed. He agrees to admit patient for further evaluation and treatment. Call ended at 19:45  CRITICAL CARE Performed by: Richarda Blade Total critical care time: 40 minutes Critical care time was exclusive of separately billable procedures and treating other patients. Critical care was necessary to treat or prevent imminent or life-threatening deterioration. Critical care was time spent personally by me on the following activities: development of treatment plan with patient and/or surrogate as well as nursing, discussions with consultants, evaluation of patient's response to treatment, examination of patient, obtaining history from patient or surrogate, ordering and performing treatments and interventions, ordering and review of laboratory studies, ordering and review of radiographic studies, pulse oximetry and re-evaluation of patient's condition.   Final Clinical Impressions(s) / ED Diagnoses   Final diagnoses:  Acute hypoxemic respiratory failure (HCC)  COPD exacerbation (HCC)    COPD exacerbation, with hypoxemia, consistent with hypoxemic respiratory failure. She is a continuing smoker, and has previously been treated with oxygen, withdrawing on her own several years ago. Doubt pneumonia, PE or impending vascular collapse. She will require admission for stabilization, and likely discharge home on oxygen.  Nursing Notes Reviewed/ Care Coordinated, and agree without changes. Applicable Imaging Reviewed.  Interpretation of Laboratory Data incorporated into ED treatment  Plan:  Admit   New Prescriptions New Prescriptions   No medications on file     Daleen Bo, MD 04/12/16 1952

## 2016-04-12 NOTE — ED Notes (Signed)
Report given to [facility], on Dept 300, all questions answered

## 2016-04-13 ENCOUNTER — Encounter (HOSPITAL_COMMUNITY): Payer: Self-pay

## 2016-04-13 DIAGNOSIS — J441 Chronic obstructive pulmonary disease with (acute) exacerbation: Secondary | ICD-10-CM

## 2016-04-13 DIAGNOSIS — J9601 Acute respiratory failure with hypoxia: Secondary | ICD-10-CM

## 2016-04-13 LAB — GLUCOSE, CAPILLARY
GLUCOSE-CAPILLARY: 182 mg/dL — AB (ref 65–99)
Glucose-Capillary: 140 mg/dL — ABNORMAL HIGH (ref 65–99)
Glucose-Capillary: 167 mg/dL — ABNORMAL HIGH (ref 65–99)

## 2016-04-13 LAB — BASIC METABOLIC PANEL
ANION GAP: 9 (ref 5–15)
BUN: 10 mg/dL (ref 6–20)
CHLORIDE: 100 mmol/L — AB (ref 101–111)
CO2: 29 mmol/L (ref 22–32)
Calcium: 9.5 mg/dL (ref 8.9–10.3)
Creatinine, Ser: 0.45 mg/dL (ref 0.44–1.00)
GFR calc non Af Amer: >60 mL/min (ref >60)
Glucose, Bld: 130 mg/dL — ABNORMAL HIGH (ref 65–99)
POTASSIUM: 4.2 mmol/L (ref 3.5–5.1)
SODIUM: 138 mmol/L (ref 135–145)

## 2016-04-13 MED ORDER — PREDNISONE 10 MG PO TABS: ORAL_TABLET | ORAL | 0 refills | Status: DC

## 2016-04-13 MED ORDER — DOXYCYCLINE HYCLATE 100 MG PO TABS: 100.0000 mg | ORAL_TABLET | Freq: Two times a day (BID) | ORAL | Status: DC

## 2016-04-13 MED ORDER — DOXYCYCLINE HYCLATE 100 MG PO TABS: 100.0000 mg | ORAL_TABLET | Freq: Two times a day (BID) | ORAL | 0 refills | Status: DC

## 2016-04-13 MED ORDER — IPRATROPIUM-ALBUTEROL 0.5-2.5 (3) MG/3ML IN SOLN: 3.0000 mL | Freq: Three times a day (TID) | RESPIRATORY_TRACT | Status: DC

## 2016-04-13 MED ORDER — PREDNISONE 20 MG PO TABS: 40.0000 mg | ORAL_TABLET | Freq: Every day | ORAL | Status: DC

## 2016-04-13 NOTE — Discharge Instructions (Signed)
Chronic Obstructive Pulmonary Disease Chronic obstructive pulmonary disease (COPD) is a common lung condition in which airflow from the lungs is limited. COPD is a general term that can be used to describe many different lung problems that limit airflow, including both chronic bronchitis and emphysema. If you have COPD, your lung function will probably never return to normal, but there are measures you can take to improve lung function and make yourself feel better. What are the causes?  Smoking (common).  Exposure to secondhand smoke.  Genetic problems.  Chronic inflammatory lung diseases or recurrent infections. What are the signs or symptoms?  Shortness of breath, especially with physical activity.  Deep, persistent (chronic) cough with a large amount of thick mucus.  Wheezing.  Rapid breaths (tachypnea).  Gray or bluish discoloration (cyanosis) of the skin, especially in your fingers, toes, or lips.  Fatigue.  Weight loss.  Frequent infections or episodes when breathing symptoms become much worse (exacerbations).  Chest tightness. How is this diagnosed? Your health care provider will take a medical history and perform a physical examination to diagnose COPD. Additional tests for COPD may include:  Lung (pulmonary) function tests.  Chest X-ray.  CT scan.  Blood tests. How is this treated? Treatment for COPD may include:  Inhaler and nebulizer medicines. These help manage the symptoms of COPD and make your breathing more comfortable.  Supplemental oxygen. Supplemental oxygen is only helpful if you have a low oxygen level in your blood.  Exercise and physical activity. These are beneficial for nearly all people with COPD.  Lung surgery or transplant.  Nutrition therapy to gain weight, if you are underweight.  Pulmonary rehabilitation. This may involve working with a team of health care providers and specialists, such as respiratory, occupational, and physical  therapists. Follow these instructions at home:  Take all medicines (inhaled or pills) as directed by your health care provider.  Avoid over-the-counter medicines or cough syrups that dry up your airway (such as antihistamines) and slow down the elimination of secretions unless instructed otherwise by your health care provider.  If you are a smoker, the most important thing that you can do is stop smoking. Continuing to smoke will cause further lung damage and breathing trouble. Ask your health care provider for help with quitting smoking. He or she can direct you to community resources or hospitals that provide support.  Avoid exposure to irritants such as smoke, chemicals, and fumes that aggravate your breathing.  Use oxygen therapy and pulmonary rehabilitation if directed by your health care provider. If you require home oxygen therapy, ask your health care provider whether you should purchase a pulse oximeter to measure your oxygen level at home.  Avoid contact with individuals who have a contagious illness.  Avoid extreme temperature and humidity changes.  Eat healthy foods. Eating smaller, more frequent meals and resting before meals may help you maintain your strength.  Stay active, but balance activity with periods of rest. Exercise and physical activity will help you maintain your ability to do things you want to do.  Preventing infection and hospitalization is very important when you have COPD. Make sure to receive all the vaccines your health care provider recommends, especially the pneumococcal and influenza vaccines. Ask your health care provider whether you need a pneumonia vaccine.  Learn and use relaxation techniques to manage stress.  Learn and use controlled breathing techniques as directed by your health care provider. Controlled breathing techniques include: 1. Pursed lip breathing. Start by breathing in (inhaling)  through your nose for 1 second. Then, purse your lips as  if you were going to whistle and breathe out (exhale) through the pursed lips for 2 seconds. 2. Diaphragmatic breathing. Start by putting one hand on your abdomen just above your waist. Inhale slowly through your nose. The hand on your abdomen should move out. Then purse your lips and exhale slowly. You should be able to feel the hand on your abdomen moving in as you exhale.  Learn and use controlled coughing to clear mucus from your lungs. Controlled coughing is a series of short, progressive coughs. The steps of controlled coughing are: 1. Lean your head slightly forward. 2. Breathe in deeply using diaphragmatic breathing. 3. Try to hold your breath for 3 seconds. 4. Keep your mouth slightly open while coughing twice. 5. Spit any mucus out into a tissue. 6. Rest and repeat the steps once or twice as needed. Contact a health care provider if:  You are coughing up more mucus than usual.  There is a change in the color or thickness of your mucus.  Your breathing is more labored than usual.  Your breathing is faster than usual. Get help right away if:  You have shortness of breath while you are resting.  You have shortness of breath that prevents you from:  Being able to talk.  Performing your usual physical activities.  You have chest pain lasting longer than 5 minutes.  Your skin color is more cyanotic than usual.  You measure low oxygen saturations for longer than 5 minutes with a pulse oximeter. This information is not intended to replace advice given to you by your health care provider. Make sure you discuss any questions you have with your health care provider. Document Released: 02/16/2005 Document Revised: 10/15/2015 Document Reviewed: 01/03/2013 Elsevier Interactive Patient Education  2017 [employer].  Steps to Quit Smoking Smoking tobacco can be harmful to your health and can affect almost every organ in your body. Smoking puts you, and those around you, at risk for  developing many serious chronic diseases. Quitting smoking is difficult, but it is one of the best things that you can do for your health. It is never too late to quit. What are the benefits of quitting smoking? When you quit smoking, you lower your risk of developing serious diseases and conditions, such as:  Lung cancer or lung disease, such as COPD.  Heart disease.  Stroke.  Heart attack.  Infertility.  Osteoporosis and bone fractures. Additionally, symptoms such as coughing, wheezing, and shortness of breath may get better when you quit. You may also find that you get sick less often because your body is stronger at fighting off colds and infections. If you are pregnant, quitting smoking can help to reduce your chances of having a baby of low birth weight. How do I get ready to quit? When you decide to quit smoking, create a plan to make sure that you are successful. Before you quit:  Pick a date to quit. Set a date within the next two weeks to give you time to prepare.  Write down the reasons why you are quitting. Keep this list in places where you will see it often, such as on your bathroom mirror or in your car or wallet.  Identify the people, places, things, and activities that make you want to smoke (triggers) and avoid them. Make sure to take these actions:  Throw away all cigarettes at home, at work, and in your car.  Throw away smoking accessories, such as Scientist, research (medical).  Clean your car and make sure to empty the ashtray.  Clean your home, including curtains and carpets.  Tell your family, friends, and coworkers that you are quitting. Support from your loved ones can make quitting easier.  Talk with your health care provider about your options for quitting smoking.  Find out what treatment options are covered by your health insurance. What strategies can I use to quit smoking? Talk with your healthcare provider about different strategies to quit smoking.  Some strategies include:  Quitting smoking altogether instead of gradually lessening how much you smoke over a period of time. Research shows that quitting cold [place] is more successful than gradually quitting.  Attending in-person counseling to help you build problem-solving skills. You are more likely to have success in quitting if you attend several counseling sessions. Even short sessions of 10 minutes can be effective.  Finding resources and support systems that can help you to quit smoking and remain smoke-free after you quit. These resources are most helpful when you use them often. They can include:  Online chats with a Social worker.  Telephone quitlines.  Printed Furniture conservator/restorer.  Support groups or group counseling.  Text messaging programs.  Mobile phone applications.  Taking medicines to help you quit smoking. (If you are pregnant or breastfeeding, talk with your health care provider first.) Some medicines contain nicotine and some do not. Both types of medicines help with cravings, but the medicines that include nicotine help to relieve withdrawal symptoms. Your health care provider may recommend:  Nicotine patches, gum, or lozenges.  Nicotine inhalers or sprays.  Non-nicotine medicine that is taken by mouth. Talk with your health care provider about combining strategies, such as taking medicines while you are also receiving in-person counseling. Using these two strategies together makes you more likely to succeed in quitting than if you used either strategy on its own. If you are pregnant or breastfeeding, talk with your health care provider about finding counseling or other support strategies to quit smoking. Do not take medicine to help you quit smoking unless told to do so by your health care provider. What things can I do to make it easier to quit? Quitting smoking might feel overwhelming at first, but there is a lot that you can do to make it easier. Take these  important actions:  Reach out to your family and friends and ask that they support and encourage you during this time. Call telephone quitlines, reach out to support groups, or work with a counselor for support.  Ask people who smoke to avoid smoking around you.  Avoid places that trigger you to smoke, such as bars, parties, or smoke-break areas at work.  Spend time around people who do not smoke.  Lessen stress in your life, because stress can be a smoking trigger for some people. To lessen stress, try:  Exercising regularly.  Deep-breathing exercises.  Yoga.  Meditating.  Performing a body scan. This involves closing your eyes, scanning your body from head to toe, and noticing which parts of your body are particularly tense. Purposefully relax the muscles in those areas.  Download or purchase mobile phone or tablet apps (applications) that can help you stick to your quit plan by providing reminders, tips, and encouragement. There are many free apps, such as QuitGuide from the [employer] Office manager for Disease Control and Prevention). You can find other support for quitting smoking (smoking cessation) through smokefree.gov and other websites.  How will I feel when I quit smoking? Within the first 24 hours of quitting smoking, you may start to feel some withdrawal symptoms. These symptoms are usually most noticeable 2-3 days after quitting, but they usually do not last beyond 2-3 weeks. Changes or symptoms that you might experience include:  Mood swings.  Restlessness, anxiety, or irritation.  Difficulty concentrating.  Dizziness.  Strong cravings for sugary foods in addition to nicotine.  Mild weight gain.  Constipation.  Nausea.  Coughing or a sore throat.  Changes in how your medicines work in your body.  A depressed mood.  Difficulty sleeping (insomnia). After the first 2-3 weeks of quitting, you may start to notice more positive results, such as:  Improved sense of smell  and taste.  Decreased coughing and sore throat.  Slower heart rate.  Lower blood pressure.  Clearer skin.  The ability to breathe more easily.  Fewer sick days. Quitting smoking is very challenging for most people. Do not get discouraged if you are not successful the first time. Some people need to make many attempts to quit before they achieve long-term success. Do your best to stick to your quit plan, and talk with your health care provider if you have any questions or concerns. This information is not intended to replace advice given to you by your health care provider. Make sure you discuss any questions you have with your health care provider. Document Released: 05/03/2001 Document Revised: 01/05/2016 Document Reviewed: 09/23/2014 Elsevier Interactive Patient Education  2017 [employer].

## 2016-04-13 NOTE — Progress Notes (Signed)
OT Screen  Patient Details Name: [name] [name] MRN: [id number] DOB: [birth year]   Cancelled Treatment:     Reason evaluation not completed: Chart reviewed, pt screened for OT needs. Pt is independent in all ADL, IADL, and functional mobility tasks. Pt drives and cares for 59 year old granddaughter daily. Pt is at baseline independence with ADL completion, no further OT needs at this time.    Guadelupe Sabin, OTR/L  [phone] 04/13/2016, 8:29 AM

## 2016-04-13 NOTE — Progress Notes (Signed)
SATURATION QUALIFICATIONS: (This note is used to comply with regulatory documentation for home oxygen)  Patient Saturations on Room Air at Rest = 91%  Patient Saturations on Room Air while Ambulating = 87%  Patient Saturations on 2 Liters of oxygen while Ambulating = 93%  Please briefly explain why patient needs home oxygen: patient desaturated while ambulating on RA to 87%, when placed on 2L O2,[place] increased to 93%

## 2016-04-13 NOTE — Evaluation (Signed)
Physical Therapy Evaluation Patient Details Name: [name] [name] MRN: [id number] DOB: December 25, 1955 Today's Date: 04/13/2016   History of Present Illness  60 y.o. female with medical history significant for COPD, ongoing tobacco abuse, hypertension, bipolar disorder, and type 2 diabetes mellitus who presents the emergency department with progressive exertional dyspnea and cough productive of thick yellow and green sputum. Patient reports similar symptoms earlier this month for which she was seen in the emergency department on the fifth, diagnosed with pneumonia, and discharged home from the ED with antibiotics. She reports making a full recovery from that episode before the insidious redevelopment of similar symptoms over the past week. She reports worsening dyspnea with exertion and a cough which is productive of thick yellow and green sputum. She denies any lower extremity edema, orthopnea, chest pain, or palpitations. There has been no fevers or chills. She denies any sick contacts or recent long distance travel. She reports that her symptoms have been progressively worsening despite her use of albuterol at home. Today, she is dyspneic at rest and presents for evaluation of this. She does not use supplemental oxygen at home.   Dx: COPD with acute exacerbation, acute hypoxic respiratory failure    Clinical Impression  Pt received in bed, and was agreeable to PT evaluation.  Pt expressed that she is normally independent with mobility, ADL's, IADL's, helps care for her 25 yo granddaughter, and is an Industrial/product designer.  During PT evaluation, she demonstrated independence with all functional mobility, however, does demonstrate SpO2 desaturation:   -While bending forward to don socks: SpO2 desat to 87% on RA with 2 minute seated recovery to increase >90% -After ambulating 50-37ft desaturated to 87% on RA, and able to increase >90% after 2 min standing rest break with 0.5L of supplemental  O2 -After ambulating another 63ft desaturated to 87% while on 0.5L of O2, and required 1 min standing rest break to recover >90%.  Pt educated on pacing herself for energy conservation throughout the day, as well as educated on the purpose of purse lipped breathing.    At this point, will likely be able to return to her normal activities without skilled PT, however she may need some supplemental O2 at home.  At this point, PT will sign off.     Follow Up Recommendations No PT follow up    Equipment Recommendations  None recommended by PT    Recommendations for Other Services       Precautions / Restrictions Precautions Precautions: None Restrictions Weight Bearing Restrictions: No      Mobility  Bed Mobility Overal bed mobility: Independent                Transfers Overall transfer level: Independent                  Ambulation/Gait Ambulation/Gait assistance: Independent Ambulation Distance (Feet): 200 Feet Assistive device: None Gait Pattern/deviations: WFL(Within Functional Limits)     General Gait Details: SpO2 desaturates after 50-54ft to 87% while on RA.  Pt took 2 min standing rest break and placed on 0.5L of O2 to increase SpO2 up to 90%.  Pt started ambulating back towards her room, and demonstrated desaturation of SpO2 to 88% while on 0.5 L, and required 1 min standing rest break to increase >90%Gait speed is slow.   Stairs            Wheelchair Mobility    Modified Rankin (Stroke Patients Only)       Balance Overall  balance assessment: No apparent balance deficits (not formally assessed)                                           Pertinent Vitals/Pain Pain Assessment: No/denies pain    Home Living   Living Arrangements: Children (Dtr (works during the day) and 72 yo granddaughter)   Type of Home: House Home Access: Stairs to enter   Technical brewer of Steps: 2 with railing Home Layout: One  level Home Equipment: None Additional Comments: Pt helps care for her 30 yo granddaughter - who goes to [employer] part of the day.     Prior Function Level of Independence: Independent         Comments: driving, community ambulator     Hand Dominance   Dominant Hand: Right    Extremity/Trunk Assessment   Upper Extremity Assessment: Overall WFL for tasks assessed           Lower Extremity Assessment: Overall WFL for tasks assessed         Communication   Communication: No difficulties  Cognition Arousal/Alertness: Awake/alert Behavior During Therapy: WFL for tasks assessed/performed Overall Cognitive Status: Within Functional Limits for tasks assessed                      General Comments      Exercises     Assessment/Plan    PT Assessment Patent does not need any further PT services  PT Problem List            PT Treatment Interventions      PT Goals (Current goals can be found in the Care Plan section)  Acute Rehab PT Goals Patient Stated Goal: to breathe better PT Goal Formulation: All assessment and education complete, DC therapy    Frequency     Barriers to discharge        Co-evaluation               End of Session Equipment Utilized During Treatment: Gait belt;Oxygen Activity Tolerance: Patient tolerated treatment well Patient left: in chair;with call bell/phone within reach Nurse Communication: Mobility status Biochemist, clinical, RN notified of SpO2 desaturation during gait. )    Functional Assessment Tool Used: [employer] "6-clicks"  Functional Limitation: Mobility: Walking and moving around Mobility: Walking and Moving Around Current Status [phone]): 0 percent impaired, limited or restricted Mobility: Walking and Moving Around Goal Status [phone]): 0 percent impaired, limited or restricted Mobility: Walking and Moving Around Discharge Status [phone]): 0 percent impaired, limited or restricted    Time: [id number] PT  Time Calculation (min) (ACUTE ONLY): 32 min   Charges:   PT Evaluation $PT Eval Low Complexity: 1 Procedure PT Treatments $Gait Training: 8-22 mins   PT G Codes:   PT G-Codes **NOT FOR INPATIENT CLASS** Functional Assessment Tool Used: [employer] "6-clicks"  Functional Limitation: Mobility: Walking and moving around Mobility: Walking and Moving Around Current Status [phone]): 0 percent impaired, limited or restricted Mobility: Walking and Moving Around Goal Status [phone]): 0 percent impaired, limited or restricted Mobility: Walking and Moving Around Discharge Status [phone]): 0 percent impaired, limited or restricted    Beth Orbin Mayeux, PT, DPT X: [id number]

## 2016-04-13 NOTE — ACP (Advance Care Planning) (Signed)
Shared information packet with Ms Pricilla Handler. She wanted to review it and talk it over with family members. I shared contact information so she could complete at a later date if she wishes to.

## 2016-04-13 NOTE — Clinical Social Work Note (Signed)
CSW received consult for COPD gold protocol. Pt does not meet criteria of 3 or more admissions in past 6 months. CSW will sign off.  Benay Pike, [place]

## 2016-04-13 NOTE — Progress Notes (Signed)
Initial Nutrition Assessment  DOCUMENTATION CODES:   Obesity class I  INTERVENTION:  Glucerna Shake po BID, each supplement provides 220 kcal and 10 grams of protein   Recommend continue MVI daily- with discharge medications  NUTRITION DIAGNOSIS:   Increased nutrient needs related to chronic illness (COPD) as evidenced by estimated needs.   GOAL:   Patient will meet greater than or equal to 90% of their needs   MONITOR:   PO intake, Labs, Weight trends  REASON FOR ASSESSMENT:   Consult Assessment of nutrition requirement/status  ASSESSMENT: Patient is a 60 y.o. female with PMH of COPD, tobacco abuse (1 ppd), hypertension, bipolar disorder, and type 2 diabetes mellitus who presents the emergency department with progressive dyspnea and productive cough. This is the second time this month that she's had acute exacerbation of COPD.   Patient denies changes in her usual appetite despite her recurrent acute illness. Her meal pattern is to eat 3 times daily . She usually eats breakfast of cold cereal and coffee, lunch is sandwich and coffee and full hot meal in the evening such as meatloaf, vegetable etc.  Her weight is down 5% compared to 6 weeks ago. This is likely a combination of factors. Patient says that she has cut back ln the cookies, and ice cream at night. Expect that her acute respiratory illness is also a contributing factor to decreased intake even though she may not be fully conscious of this. She is at risk for malnutrition given her medical hx and recent recurrent  illness. She has some mild muscle loss which is expected with her age and catabolic illness.     Diet Order:  Diet regular Room service appropriate? Yes; Fluid consistency: Thin  Skin:  Reviewed, no issues  Last BM:  11/21  Height:   Ht Readings from Last 1 Encounters:  04/12/16 5\' 5"  (1.651 m)    Weight:   Wt Readings from Last 1 Encounters:  04/12/16 196 lb 14.4 oz (89.3 kg)    Ideal Body  Weight:  57 kg  BMI:  Body mass index is 32.77 kg/m.  Estimated Nutritional Needs:   Kcal:  1500-1710  Protein:  80-90 gr  Fluid:  >1.5 liters daily  EDUCATION NEEDS:   Education needs addressed  Colman Cater MS,RD,CSG,LDN Office: [phone] Pager: [phone]

## 2016-04-13 NOTE — Progress Notes (Signed)
PROGRESS NOTE  [name] [name] [id number] DOB: [birth year] DOA: 04/12/2016 PCP: Leonard Downing, MD  Brief Narrative: 60 year old woman PMH COPD, ongoing cigarette smoking, previous oxygen dependence, treatment for pneumonia in October this year, presented with dyspnea on exertion, cough. Admitted for COPD with acute exacerbation, acute hypoxic respiratory failure.  Assessment/Plan: 1. COPD exacerbation with acute hypoxic respiratory failure complicated by ongoing cigarette use. Clinically much improved. 2. COPD, previous use of home oxygen stopped 3 years ago. Resume home oxygen use on discharge. 3. Tobacco use disorder, cigarettes. Recommended cessation. 4. Bipolar 1 disorder appears stable. 5. Diabetes mellitus type 2. Stable. 6. Obesity class I   Overall much improved symptomatically and clinically. She will need home oxygen, she has been on this before. Plan discharge home today once home oxygen and received. Steroid taper. Antibiotics. Follow-up with primary care physician as an outpatient.  Murray Hodgkins, MD  [facility] Direct contact: [phone] --Via amion app OR  --[email or web address]; password [email or web address]  7PM-7AM contact night coverage as above 04/13/2016, 9:22 AM  LOS: 0 days   Consultants:    Procedures:    Antimicrobials:  Levofloxacin 11/22  Doxycycline 11/23 >>  CC: f/u shortness of breath  Interval history/Subjective: Feeling much better. Breathing is much better. Wants to go home.  Objective: Vitals:   04/12/16 2030 04/12/16 2133 04/13/16 0535 04/13/16 0853  BP: 123/63 (!) 143/98 (!) 157/72   Pulse: 88 91 92 96  Resp: 20 20 20    Temp:  97.8 F (36.6 C) 97.4 F (36.3 C)   TempSrc:  Oral Oral   SpO2: 92% 90% 97% 91%  Weight:  89.3 kg (196 lb 14.4 oz)    Height:        Intake/Output Summary (Last 24 hours) at 04/13/16 [id number] Last data filed at 04/12/16 1835  Gross per 24 hour  Intake               50 ml  Output                0  ml  Net               50 ml     Filed Weights   04/12/16 1434 04/12/16 2133  Weight: 88.9 kg (196 lb) 89.3 kg (196 lb 14.4 oz)    Exam:    Constitutional:  . Appears calm and comfortable ENMT:  . grossly normal hearing  Respiratory:  . Bilateral wheezes. No rhonchi or rales. Fair air movement. Marland Kitchen Respiratory effort mildly increased. Speaks in full sentences. Cardiovascular:  . RRR, no m/r/g . No LE extremity edema   Psychiatric:  . judgement and insight appear normal . Mental status o Mood, affect appropriate  I have personally reviewed following labs and imaging studies:  Basic metabolic panel unremarkable  Urinalysis negative  Scheduled Meds: . albuterol  15 mg/hr Nebulization Once  . amitriptyline  200 mg Oral QHS  . amLODipine  5 mg Oral Daily  . benztropine  0.5 mg Oral QHS  . busPIRone  10 mg Oral BID  . divalproex  500 mg Oral Daily   And  . divalproex  1,000 mg Oral QHS  . enoxaparin (LOVENOX) injection  40 mg Subcutaneous Q24H  . insulin aspart  0-5 Units Subcutaneous QHS  . insulin aspart  0-9 Units Subcutaneous TID WC  . levofloxacin (LEVAQUIN) IV  750 mg Intravenous Q24H  . methylPREDNISolone (SOLU-MEDROL) injection  60 mg Intravenous Q8H  . multivitamin with minerals  1 tablet Oral  Daily  . pravastatin  20 mg Oral q1800  . sertraline  200 mg Oral Daily  . sodium chloride flush  3 mL Intravenous Q12H  . traZODone  50-100 mg Oral QHS   Continuous Infusions:  Principal Problem:   COPD exacerbation (HCC) Active Problems:   Hypertension   Bipolar 1 disorder (HCC)   Non-insulin dependent type 2 diabetes mellitus ([place])   Acute respiratory failure with hypoxia ([place])   LOS: 0 days

## 2016-04-13 NOTE — Progress Notes (Signed)
Patient discharging home.  IV removed- WNL.  Reviewed DC instructions and medications.  Verbalizes understanding.  Follow up in place.  No questions at this time.  Awaiting delivery of O2 and for ride to get here.

## 2016-04-13 NOTE — Discharge Summary (Signed)
Physician Discharge Summary  [name] [name] [id number] DOB: 1955/07/04 DOA: 04/12/2016  PCP: Leonard Downing, MD  Admit date: 04/12/2016 Discharge date: 04/13/2016  Recommendations for Outpatient Follow-up:  1. Resolution of COPD exacerbation 2. Continue to encourage abstinence from smoking 3. Newly started on home oxygen for COPD  Follow-up Information    Leonard Downing, MD. Schedule an appointment as soon as possible for a visit on 04/19/2016.   Specialty:  Family Medicine Why:  11:00 Contact information: [place] [address] [phone]          Discharge Diagnoses:  1. COPD exacerbation 2. Acute hypoxic respiratory failure 3. Tobacco use disorder 4. Diabetes mellitus type 2 5. Obesity class I  Discharge Condition: improved Disposition: home with home oxygen  Diet recommendation: diabetic diet  Filed Weights   04/12/16 1434 04/12/16 2133  Weight: 88.9 kg (196 lb) 89.3 kg (196 lb 14.4 oz)    History of present illness:  61 year old woman PMH COPD, ongoing cigarette smoking, previous oxygen dependence, treatment for pneumonia in October this year, presented with dyspnea on exertion, cough. Admitted for COPD with acute exacerbation, acute hypoxic respiratory failure.  Hospital Course:  Patient was observed, treated with steroids, bronchodilators and antibiotics. Rapid clinical improvement was noted. Hypoxia persisted. Given her history of COPD, smoking, previous requirement for oxygen, presume her oxygen requirement is secondary to underlying COPD. Hospitalization was uncomplicated. Plan discharge on steroid taper and oral antibiotics. Individual issues as below.  1. COPD exacerbation with acute hypoxic respiratory failure complicated by ongoing cigarette use. Clinically much improved. 2. COPD, previous use of home oxygen stopped 3 years ago. Resume home oxygen use on discharge. 3. Tobacco use disorder, cigarettes.  Recommended cessation. 4. Bipolar 1 disorder appears stable. 5. Diabetes mellitus type 2. Stable. 6. Obesity class I  Consultants:  none   Procedures:  none   Antimicrobials:  Levofloxacin 11/22  Doxycycline 11/23 >> 11/28 Discharge Instructions  Discharge Instructions    Diet Carb Modified    Complete by:  As directed    Discharge instructions    Complete by:  As directed    Call your physician or seek immediate medical attention for fever, shortness of breath, wheezing, fever or worsening of condition.   Increase activity slowly    Complete by:  As directed        Medication List    STOP taking these medications   amoxicillin 500 MG capsule Commonly known as:  AMOXIL   azithromycin 250 MG tablet Commonly known as:  ZITHROMAX     TAKE these medications   albuterol (5 MG/ML) 0.5% nebulizer solution Commonly known as:  PROVENTIL Take 2.5 mg by nebulization every 6 (six) hours as needed for wheezing or shortness of breath.   amitriptyline 100 MG tablet Commonly known as:  ELAVIL Take 200 mg by mouth at bedtime.   amLODipine 5 MG tablet Commonly known as:  NORVASC Take 5 mg by mouth daily.   benztropine 0.5 MG tablet Commonly known as:  COGENTIN Take 0.5 mg by mouth at bedtime.   busPIRone 10 MG tablet Commonly known as:  BUSPAR Take 10 mg by mouth 2 (two) times daily.   divalproex 500 MG DR tablet Commonly known as:  DEPAKOTE Take 500 mg by mouth 2 (two) times daily. *Takes 500mg  in the morning and 1000mg  at bedtime*   doxycycline 100 MG tablet Commonly known as:  VIBRA-TABS Take 1 tablet (100 mg total) by mouth every 12 (twelve) hours. Start taking  on:  04/14/2016   HYDROcodone-acetaminophen 5-325 MG tablet Commonly known as:  NORCO/VICODIN Take 2 tablets by mouth every 4 (four) hours as needed for moderate pain.   lovastatin 20 MG tablet Commonly known as:  MEVACOR Take 20 mg by mouth daily at 6 PM.   metFORMIN 850 MG tablet Commonly  known as:  GLUCOPHAGE Take 850 mg by mouth 2 (two) times daily with a meal.   multivitamins ther. w/minerals Tabs tablet Take 1 tablet by mouth daily.   predniSONE 10 MG tablet Commonly known as:  DELTASONE Take daily by mouth: 40 mg x3 days, then 20 mg x3 days, then 10 mg x3 days, then stop.   sertraline 100 MG tablet Commonly known as:  ZOLOFT Take 200 mg by mouth daily.   traZODone 50 MG tablet Commonly known as:  DESYREL Take 50-100 mg by mouth at bedtime.            Durable Medical Equipment        Start     Ordered   04/13/16 1134  For home use only DME oxygen  Once    Question Answer Comment  Mode or (Route) Nasal cannula   Liters per Minute 2   Oxygen delivery system Gas      04/13/16 1134     Allergies  Allergen Reactions  . Diclofenac Tinitus    swelling in feet  . Morphine And Related Nausea And Vomiting and Other (See Comments)    headache  . Potassium-Containing Compounds Itching    The results of significant diagnostics from this hospitalization (including imaging, microbiology, ancillary and laboratory) are listed below for reference.    Significant Diagnostic Studies: Dg Chest 2 View  Result Date: 04/12/2016 CLINICAL DATA:  Wet and dry cough for 1 month EXAM: CHEST  2 VIEW COMPARISON:  04/08/2016, 02/25/2016 FINDINGS: Mild lingular opacity is unchanged. No acute consolidation or effusion. Stable borderline cardiomegaly. No overt edema. No pneumothorax. IMPRESSION: No significant interval change.  No interval consolidation. Electronically Signed   By: Donavan Foil M.D.   On: 04/12/2016 14:53   Labs: Basic Metabolic Panel:  Recent Labs Lab 04/12/16 1549 04/13/16 0629  NA 139 138  K 3.8 4.2  CL 102 100*  CO2 29 29  GLUCOSE 122* 130*  BUN 11 10  CREATININE 0.37* 0.45  CALCIUM 9.3 9.5   CBC:  Recent Labs Lab 04/12/16 1549  WBC 6.9  NEUTROABS 3.4  HGB 13.8  HCT 42.6  MCV 90.1  PLT 173   CBG:  Recent Labs Lab  04/12/16 2233 04/13/16 0755 04/13/16 1204  GLUCAP 226* 140* 182*    Principal Problem:   COPD exacerbation (HCC) Active Problems:   Hypertension   Bipolar 1 disorder ([place])   Non-insulin dependent type 2 diabetes mellitus ([place])   Acute respiratory failure with hypoxia ([place])   Time coordinating discharge: 20 minutes  Signed:  Murray Hodgkins, MD [facility] 04/13/2016, 12:54 PM

## 2016-04-13 NOTE — Care Management Note (Signed)
Case Management Note  Patient Details  Name: [name] [name] MRN: [id number] Date of Birth: March 18, 1956  Subjective/Objective:                  Pt admitted with copd. She is from home, lives with family and is ind with ADL's. She has met requirements for home oxygen at DC. Pt has used [facility] in the past and would like to use them again. Blake Divine, of [facility], is aware of referral and will obtain pt info from chart and deliver DME to pt room prior to DC. Pt has neb machine PTA.   Action/Plan: No further needs. Discharging home today.   Expected Discharge Date:  04/14/16               Expected Discharge Plan:  Home/Self Care  In-House Referral:  NA  Discharge planning Services  CM Consult  Post Acute Care Choice:  Durable Medical Equipment Choice offered to:  Patient  DME Arranged:  Oxygen DME Agency:  [place]:    [facility] [place] Agency:     Status of Service:  Completed, signed off   Sherald Barge, RN 04/13/2016, 12:28 PM

## 2016-04-14 LAB — HEMOGLOBIN A1C
Hgb A1c MFr Bld: 5.6 % (ref 4.8–5.6)
Mean Plasma Glucose: 114 mg/dL

## 2016-04-14 LAB — URINE CULTURE: Culture: NO GROWTH

## 2016-09-19 ENCOUNTER — Other Ambulatory Visit: Payer: Self-pay | Admitting: Family Medicine

## 2016-09-19 DIAGNOSIS — Z1231 Encounter for screening mammogram for malignant neoplasm of breast: Secondary | ICD-10-CM

## 2016-10-04 ENCOUNTER — Ambulatory Visit
Admission: RE | Admit: 2016-10-04 | Discharge: 2016-10-04 | Disposition: A | Discharge status: Home / Self Care | Payer: Medicaid Other | Source: Ambulatory Visit | Attending: Family Medicine | Admitting: Family Medicine

## 2016-10-04 DIAGNOSIS — Z1231 Encounter for screening mammogram for malignant neoplasm of breast: Secondary | ICD-10-CM

## 2016-10-05 ENCOUNTER — Emergency Department (HOSPITAL_COMMUNITY): Payer: Medicaid Other

## 2016-10-05 ENCOUNTER — Encounter (HOSPITAL_COMMUNITY): Payer: Self-pay

## 2016-10-05 ENCOUNTER — Emergency Department (HOSPITAL_COMMUNITY)
Admission: EM | Admit: 2016-10-05 | Discharge: 2016-10-05 | Disposition: A | Discharge status: Home / Self Care | Payer: Medicaid Other | Attending: Emergency Medicine | Admitting: Emergency Medicine

## 2016-10-05 DIAGNOSIS — W0110XA Fall on same level from slipping, tripping and stumbling with subsequent striking against unspecified object, initial encounter: Secondary | ICD-10-CM | POA: Insufficient documentation

## 2016-10-05 DIAGNOSIS — Y999 Unspecified external cause status: Secondary | ICD-10-CM | POA: Diagnosis not present

## 2016-10-05 DIAGNOSIS — Z7984 Long term (current) use of oral hypoglycemic drugs: Secondary | ICD-10-CM | POA: Diagnosis not present

## 2016-10-05 DIAGNOSIS — M1712 Unilateral primary osteoarthritis, left knee: Secondary | ICD-10-CM | POA: Insufficient documentation

## 2016-10-05 DIAGNOSIS — F17210 Nicotine dependence, cigarettes, uncomplicated: Secondary | ICD-10-CM | POA: Diagnosis not present

## 2016-10-05 DIAGNOSIS — M25512 Pain in left shoulder: Secondary | ICD-10-CM | POA: Diagnosis not present

## 2016-10-05 DIAGNOSIS — J449 Chronic obstructive pulmonary disease, unspecified: Secondary | ICD-10-CM | POA: Insufficient documentation

## 2016-10-05 DIAGNOSIS — I10 Essential (primary) hypertension: Secondary | ICD-10-CM | POA: Diagnosis not present

## 2016-10-05 DIAGNOSIS — E119 Type 2 diabetes mellitus without complications: Secondary | ICD-10-CM | POA: Insufficient documentation

## 2016-10-05 DIAGNOSIS — Z79899 Other long term (current) drug therapy: Secondary | ICD-10-CM | POA: Diagnosis not present

## 2016-10-05 DIAGNOSIS — Z85038 Personal history of other malignant neoplasm of large intestine: Secondary | ICD-10-CM | POA: Insufficient documentation

## 2016-10-05 DIAGNOSIS — Y929 Unspecified place or not applicable: Secondary | ICD-10-CM | POA: Insufficient documentation

## 2016-10-05 DIAGNOSIS — M25532 Pain in left wrist: Secondary | ICD-10-CM | POA: Insufficient documentation

## 2016-10-05 DIAGNOSIS — S8992XA Unspecified injury of left lower leg, initial encounter: Secondary | ICD-10-CM | POA: Diagnosis present

## 2016-10-05 DIAGNOSIS — E039 Hypothyroidism, unspecified: Secondary | ICD-10-CM | POA: Insufficient documentation

## 2016-10-05 DIAGNOSIS — S8002XA Contusion of left knee, initial encounter: Secondary | ICD-10-CM | POA: Insufficient documentation

## 2016-10-05 DIAGNOSIS — Y939 Activity, unspecified: Secondary | ICD-10-CM | POA: Diagnosis not present

## 2016-10-05 MED ORDER — OXYCODONE-ACETAMINOPHEN 5-325 MG PO TABS
1.0000 | ORAL_TABLET | Freq: Once | ORAL | Status: AC
  Administered 2016-10-05: 1 via ORAL
  Filled 2016-10-05: qty 1

## 2016-10-05 NOTE — ED Notes (Signed)
To x-ray

## 2016-10-05 NOTE — ED Provider Notes (Signed)
[facility] DEPT Provider Note   CSN: [id number] Arrival date & time: 10/05/16  1806     History   Chief Complaint Chief Complaint  Patient presents with  . Knee Pain    HPI Irania [name] is a 61 y.o. female.  Patient presents for evaluation of injury to left knee, when her dog, ran around her legs, with a rope around his neck, which tangled up around her legs causing her to fall forward injuring her left knee, and left shoulder.  She is able to get up and walk but had pain in the left knee, so sat down.  She presents by EMS with her knee immobilized.  She has not been treated for pain yet.  She denies headache, neck pain, back pain, weakness, dizziness, nausea, vomiting or paresthesia.  There are no other known modifying factors.  HPI  Past Medical History:  Diagnosis Date  . Anxiety   . Bipolar 1 disorder ([place])   . Cancer ([place])    colon  . Chronic diarrhea    Followed by Dr Michail Sermon   . Complication of anesthesia    Hx resp distress during surgery  . COPD (chronic obstructive pulmonary disease) ([place])   . Depression   . Diabetes mellitus ([place])   . GI bleed   . Headache(784.0)   . Hyperlipidemia   . Hypertension   . Hypothyroidism   . Insomnia   . Polysubstance abuse Quit 2008    H/O methamphetamine and alcohol abuse, clean x 6 years  . Shortness of breath   . Suicidal thoughts   . Tobacco abuse     Patient Active Problem List   Diagnosis Date Noted  . COPD still smoking/ pft's pending  12/27/2012  . Chronic respiratory failure ([place]) 12/27/2012  . Personal history of colonic polyps 12/11/2012  . Chronic diarrhea 12/11/2012  . Benign neoplasm of colon 12/11/2012  . Migraine 12/04/2012  . Alcohol abuse 12/04/2012  . Gait instability 08/29/2012  . Tremor 08/29/2012  . Hyperlipidemia 07/08/2012  . Acute respiratory failure with hypoxia ([place]) 07/08/2012  . COPD exacerbation ([place]) 04/13/2012  . Acute-on-chronic respiratory failure ([place]) 04/13/2012  .  Low back pain 04/13/2012  . Thrombocytopenia ([place]) 04/13/2012  . Non-insulin dependent type 2 diabetes mellitus ([place]) 04/13/2012  . Tobacco abuse 04/13/2012  . Hypertension   . Bipolar 1 disorder ([place])   . Insomnia   . Depression   . Suicidal thoughts     Past Surgical History:  Procedure Laterality Date  . ABDOMINAL HYSTERECTOMY    . APPENDECTOMY    . COLON SURGERY     "colon repair"  . COLONOSCOPY WITH PROPOFOL N/A 12/11/2012   Procedure: COLONOSCOPY WITH PROPOFOL;  Surgeon: Jerene Bears, MD;  Location: [facility] ENDOSCOPY;  Service: Gastroenterology;  Laterality: N/A;  . FINGER SURGERY     middle left finger  . HEMORRHOID SURGERY    . HERNIA REPAIR     x 3  . KNEE SURGERY     Left knee tendon surgery  . TONSILLECTOMY    . TUBAL LIGATION      OB History    No data available       Home Medications    Prior to Admission medications   Medication Sig Start Date End Date Taking? Authorizing Provider  albuterol (PROVENTIL) (5 MG/ML) 0.5% nebulizer solution Take 2.5 mg by nebulization every 6 (six) hours as needed for wheezing or shortness of breath. 04/16/12   Bonnielee Haff, MD  amitriptyline Madelin Headings)  100 MG tablet Take 200 mg by mouth at bedtime.    [provider]  amLODipine (NORVASC) 5 MG tablet Take 5 mg by mouth daily.    [provider]  benztropine (COGENTIN) 0.5 MG tablet Take 0.5 mg by mouth at bedtime.    [provider]  busPIRone (BUSPAR) 10 MG tablet Take 10 mg by mouth 2 (two) times daily.    [provider]  divalproex (DEPAKOTE) 500 MG DR tablet Take 500 mg by mouth 2 (two) times daily. *Takes 500mg  in the morning and 1000mg  at bedtime*     [provider]  doxycycline (VIBRA-TABS) 100 MG tablet Take 1 tablet (100 mg total) by mouth every 12 (twelve) hours. 04/14/16   Samuella Cota, MD  HYDROcodone-acetaminophen (NORCO/VICODIN) 5-325 MG tablet Take 2 tablets by mouth every 4 (four) hours as needed for moderate  pain.    [provider]  lovastatin (MEVACOR) 20 MG tablet Take 20 mg by mouth daily at 6 PM.    [provider]  metFORMIN (GLUCOPHAGE) 850 MG tablet Take 850 mg by mouth 2 (two) times daily with a meal.    [provider]  Multiple Vitamins-Minerals (MULTIVITAMINS THER. W/MINERALS) TABS tablet Take 1 tablet by mouth daily.    [provider]  predniSONE (DELTASONE) 10 MG tablet Take daily by mouth: 40 mg x3 days, then 20 mg x3 days, then 10 mg x3 days, then stop. 04/13/16   Samuella Cota, MD  sertraline (ZOLOFT) 100 MG tablet Take 200 mg by mouth daily.     [provider]  traZODone (DESYREL) 50 MG tablet Take 50-100 mg by mouth at bedtime.    [provider]    Family History Family History  Problem Relation Age of Onset  . Multiple sclerosis Mother   . Alcoholism Father   . Depression Brother   . Emphysema Maternal Grandfather        smoked  . Breast cancer Neg Hx     Social History Social History  Substance Use Topics  . Smoking status: Current Every Day Smoker    Packs/day: 1.00    Years: 42.00    Types: Cigarettes  . Smokeless tobacco: Never Used  . Alcohol use Yes     Comment: occ     Allergies   Diclofenac; Morphine and related; and Potassium-containing compounds   Review of Systems Review of Systems  All other systems reviewed and are negative.    Physical Exam Updated Vital Signs BP (!) 157/85 (BP Location: Right Arm)   Pulse 98   Temp 98 F (36.7 C) (Oral)   Resp 17   Ht 5\' 5"  (1.651 m)   Wt 200 lb (90.7 kg)   SpO2 (S) 90% Comment: patient has copd, states this is her normal oxygen level  BMI 33.28 kg/m   Physical Exam  Constitutional: She is oriented to person, place, and time. She appears well-developed and well-nourished. No distress.  HENT:  Head: Normocephalic and atraumatic.  Eyes: Conjunctivae and EOM are normal. Pupils are equal, round, and reactive to light.  Neck: Normal  range of motion and phonation normal. Neck supple.  Cardiovascular: Normal rate and regular rhythm.   Pulmonary/Chest: Effort normal and breath sounds normal. She exhibits no tenderness.  Abdominal: Soft. She exhibits no distension. There is no tenderness. There is no guarding.  Musculoskeletal: Normal range of motion. She exhibits no deformity.  Tender left knee, with moderate effusion.  Intact knee extension.  No tenderness left hip, left ankle or left foot.  Normal range of motion arms and right leg.  Left shoulder somewhat tender, no deformity.  Neurological: She is alert and oriented to person, place, and time. She exhibits normal muscle tone.  Skin: Skin is warm and dry.  Psychiatric: She has a normal mood and affect. Her behavior is normal. Judgment and thought content normal.  Nursing note and vitals reviewed.    ED Treatments / Results  Labs (all labs ordered are listed, but only abnormal results are displayed) Labs Reviewed - No data to display  EKG  EKG Interpretation None       Radiology Dg Wrist Complete Left  Result Date: 10/05/2016 CLINICAL DATA:  Fall.  Left wrist pain.  Initial encounter. EXAM: LEFT WRIST - COMPLETE 3+ VIEW COMPARISON:  None. FINDINGS: There is no evidence of acute fracture or dislocation. Old fracture deformity of distal radius seen with mild degenerative spurring. No other osseous abnormality identified. IMPRESSION: No acute findings. Electronically Signed   By: Earle Gell M.D.   On: 10/05/2016 19:35   Dg Shoulder Left  Result Date: 10/05/2016 CLINICAL DATA:  Fall.  Left shoulder pain.  Initial encounter. EXAM: LEFT SHOULDER - 2+ VIEW COMPARISON:  None. FINDINGS: There is no evidence of fracture or dislocation. There is no evidence of arthropathy or other focal bone abnormality. Soft tissues are unremarkable. IMPRESSION: Negative. Electronically Signed   By: Earle Gell M.D.   On: 10/05/2016 19:34   Dg Knee Complete 4 Views Left  Result Date:  10/05/2016 CLINICAL DATA:  Fall.  Left knee pain.  Initial encounter. EXAM: LEFT KNEE - COMPLETE 4+ VIEW COMPARISON:  12/19/2013 FINDINGS: No evidence of fracture, dislocation, or joint effusion. Anterior infrapatellar soft tissue swelling noted. Tricompartmental osteoarthritis is seen, with predominant involvement of the medial compartment which shows progression since previous study. No other osseous abnormality identified. IMPRESSION: Anterior infrapatellar soft tissue swelling. No evidence of fracture or dislocation. Progressive tricompartmental osteoarthritis, with predominant involvement of medial compartment. Electronically Signed   By: Earle Gell M.D.   On: 10/05/2016 19:37   Mm Digital Screening Bilateral  Result Date: 10/04/2016 CLINICAL DATA:  Screening. EXAM: DIGITAL SCREENING BILATERAL MAMMOGRAM WITH CAD COMPARISON:  None. ACR Breast Density Category b: There are scattered areas of fibroglandular density. FINDINGS: There are no findings suspicious for malignancy. Images were processed with CAD. IMPRESSION: No mammographic evidence of malignancy. A result letter of this screening mammogram will be mailed directly to the patient. RECOMMENDATION: Screening mammogram in one year. (Code:SM-B-01Y) BI-RADS CATEGORY  1: Negative. Electronically Signed   By: Lajean Manes M.D.   On: 10/04/2016 17:11    Procedures Procedures (including critical care time)  Medications Ordered in ED Medications  oxyCODONE-acetaminophen (PERCOCET/ROXICET) 5-325 MG per tablet 1 tablet (1 tablet Oral Given 10/05/16 1847)     Initial Impression / Assessment and Plan / ED Course  I have reviewed the triage vital signs and the nursing notes.  Pertinent labs & imaging results that were available during my care of the patient were reviewed by me and considered in my medical decision making (see chart for details).  Clinical Course as of Oct 05 1952  Wed Oct 05, 2016  1928 No fracture DG Wrist Complete Left [EW]    1928 No fracture or dislocation DG Shoulder Left [EW]  1928 No fracture DG Knee Complete 4 Views Left [EW]    Clinical Course User Index [EW] Daleen Bo, MD  Medications  oxyCODONE-acetaminophen (PERCOCET/ROXICET) 5-325 MG per tablet 1 tablet (1 tablet Oral Given 10/05/16 1847)    Patient Vitals for the past 24 hrs:  BP Temp Temp src Pulse Resp SpO2 Height Weight  10/05/16 1808 - - - - - - 5\' 5"  (1.651 m) 200 lb (90.7 kg)  10/05/16 1807 (!) 157/85 98 F (36.7 C) Oral 98 17 90 % - -    7:54 PM Reevaluation with update and discussion. After initial assessment and treatment, an updated evaluation reveals no change in clinical status.  Findings discussed with the patient and her daughter, all questions were answered. Fumiko Cham L    Final Clinical Impressions(s) / ED Diagnoses   Final diagnoses:  Contusion of left knee, initial encounter  Osteoarthritis of left knee, unspecified osteoarthritis type    Follow-up multiple contusions.  Doubt fracture, significant muscle injury or ligamentous injury. Underlying arthritis left knee which probably contributes to her discomfort.  Nursing Notes Reviewed/ Care Coordinated Applicable Imaging Reviewed Interpretation of Laboratory Data incorporated into ED treatment  The patient appears reasonably screened and/or stabilized for discharge and I doubt any other medical condition or other [facility] requiring further screening, evaluation, or treatment in the ED at this time prior to discharge.  Plan: Home Medications-continue usual medications, use ibuprofen if needed for additional pain control; Home Treatments-knee immobilizer to use as needed ambulation, cryotherapy for the swelling and pain; return here if the recommended treatment, does not improve the symptoms; Recommended follow up-orthopedic follow-up 1 week.   New Prescriptions New Prescriptions   No medications on file     Daleen Bo, MD 10/05/16 [phone]

## 2016-10-05 NOTE — Discharge Instructions (Signed)
Use ice on the sore areas 3 or 4 times a day for several days.  Use the knee immobilizer when you are walking.  Continue to take your usual medications.  You can also take ibuprofen 400 mg, 3 times a day with meals, as needed for additional pain control.

## 2016-10-05 NOTE — ED Triage Notes (Signed)
Patient tripped over dog this evening. Complains of left shoulder, wrist and knee pain. Denies any other complaints.

## 2016-10-06 ENCOUNTER — Telehealth: Payer: Self-pay | Admitting: Orthopaedic Surgery

## 2016-10-06 NOTE — Telephone Encounter (Signed)
Spoke with patient and daughter Coralyn Helling today following [facility] visit 10/05/16 for problem of left knee injured from falling.  Offered appointment, upon receipt of referral from primary care provider, per her insurance requirement. Appointment pending.  [phone]

## 2016-10-11 NOTE — Telephone Encounter (Signed)
I had also tried calling back to patient 10/06/16. Left message. *Monday, 10/10/16, patient was scheduled for appointment with Dr Aline Brochure, next available; referral has been received.  Patient is aware of appointment.

## 2016-10-18 ENCOUNTER — Encounter: Payer: Self-pay | Admitting: Orthopedic Surgery

## 2016-10-18 ENCOUNTER — Ambulatory Visit (INDEPENDENT_AMBULATORY_CARE_PROVIDER_SITE_OTHER): Payer: Medicaid Other | Admitting: Orthopedic Surgery

## 2016-10-18 VITALS — BP 129/78 | HR 103 | Wt 190.0 lb

## 2016-10-18 DIAGNOSIS — S8002XA Contusion of left knee, initial encounter: Secondary | ICD-10-CM | POA: Diagnosis not present

## 2016-10-18 DIAGNOSIS — M1712 Unilateral primary osteoarthritis, left knee: Secondary | ICD-10-CM | POA: Diagnosis not present

## 2016-10-18 DIAGNOSIS — Z87891 Personal history of nicotine dependence: Secondary | ICD-10-CM

## 2016-10-18 NOTE — Progress Notes (Signed)
NEW PATIENT OFFICE VISIT    Chief Complaint  Patient presents with  . Knee Injury    LEFT KNEE INJURY S/P FALL 10/05/16    This is a new patient to our practice. She 61 years old she had a left knee medial ligament repair many years ago and prior to her most recent onset of problems with this knee she has had grinding and crepitance and some weakness in the left knee  She was entangled in a dog leash fell and landed on her left knee presents with new onset left knee pain since May 16. She was seen in the emergency room placed in a knee immobilizer and comes in for evaluation. Her main complaints are DULL medial knee pain swelling decreased range of motion.    Review of Systems  Constitutional: Negative for fever.  Musculoskeletal: Positive for back pain, joint pain and myalgias.  Neurological: Negative for tingling, tremors, sensory change and weakness.     Past Medical History:  Diagnosis Date  . Anxiety   . Bipolar 1 disorder ([place])   . Cancer ([place])    colon  . Chronic diarrhea    Followed by Dr Michail Sermon   . Complication of anesthesia    Hx resp distress during surgery  . COPD (chronic obstructive pulmonary disease) ([place])   . Depression   . Diabetes mellitus ([place])   . GI bleed   . Headache(784.0)   . Hyperlipidemia   . Hypertension   . Hypothyroidism   . Insomnia   . Polysubstance abuse Quit 2008    H/O methamphetamine and alcohol abuse, clean x 6 years  . Shortness of breath   . Suicidal thoughts   . Tobacco abuse     Past Surgical History:  Procedure Laterality Date  . ABDOMINAL HYSTERECTOMY    . APPENDECTOMY    . COLON SURGERY     "colon repair"  . COLONOSCOPY WITH PROPOFOL N/A 12/11/2012   Procedure: COLONOSCOPY WITH PROPOFOL;  Surgeon: Jerene Bears, MD;  Location: [facility];  Service: Gastroenterology;  Laterality: N/A;  . FINGER SURGERY     middle left finger  . HEMORRHOID SURGERY    . HERNIA REPAIR     x 3  . KNEE SURGERY     Left knee tendon  surgery  . TONSILLECTOMY    . TUBAL LIGATION      Family History  Problem Relation Age of Onset  . Multiple sclerosis Mother   . Alcoholism Father   . Depression Brother   . Emphysema Maternal Grandfather        smoked  . Breast cancer Neg Hx    Social History  Substance Use Topics  . Smoking status: Current Every Day Smoker    Packs/day: 1.00    Years: 42.00    Types: Cigarettes  . Smokeless tobacco: Never Used  . Alcohol use Yes     Comment: occ    There were no vitals taken for this visit.  Physical Exam  Constitutional: She is oriented to person, place, and time. She appears well-developed and well-nourished.  Musculoskeletal:       Left knee: She exhibits no effusion.  Neurological: She is alert and oriented to person, place, and time. She has normal strength. No sensory deficit. Gait abnormal.  Psychiatric: She has a normal mood and affect.  Vitals reviewed.   Right Knee Exam   Range of Motion  The patient has normal right knee ROM.  Muscle Strength  The patient has normal right knee strength.  Other  Erythema: absent Sensation: normal Pulse: present Swelling: none   Left Knee Exam   Tenderness  The patient is experiencing tenderness in the medial joint line.  Range of Motion  Extension: -5  Flexion: 120   Muscle Strength   The patient has normal left knee strength.  Tests  [place]:  Medial - negative Lateral - negative Drawer:       Anterior - negative     Posterior - negative Varus: negative Valgus: negative  Other  Erythema: absent Scars: present Sensation: normal Pulse: present Swelling: none Effusion: no effusion present      No orders of the defined types were placed in this encounter. The plain films of her wrist show some midcarpal arthritis. We also see normal views of the left shoulder AP lateral and axillary  4 views of the left knee show medial compartment arthritis with some moderate to severe disease in terms  of narrowing and there are secondary bone changes    Encounter Diagnoses  Name Primary?  . Primary osteoarthritis of left knee Yes  . Smoking hx   . Contusion of left knee, initial encounter      PLAN:   I don't think this particular injury will cause her any long-term problems however she does have osteoarthritis left knee  Recommend protective hinged knee brace with economy hinged brace, knee rehabilitation exercises to emphasize range of motion and quadriceps strengthening  If she still symptomatic at 6 weeks she is encouraged to call us back for NEW APPT.

## 2016-10-18 NOTE — Patient Instructions (Signed)
Wear brace 6 weeks   Do knee exercises 6 weeks   Call us if not better after 6 weeks

## 2017-04-27 DIAGNOSIS — G894 Chronic pain syndrome: Secondary | ICD-10-CM | POA: Diagnosis present

## 2017-04-27 DIAGNOSIS — F1190 Opioid use, unspecified, uncomplicated: Secondary | ICD-10-CM | POA: Insufficient documentation

## 2017-04-27 DIAGNOSIS — G4489 Other headache syndrome: Secondary | ICD-10-CM | POA: Insufficient documentation

## 2017-04-27 DIAGNOSIS — G4709 Other insomnia: Secondary | ICD-10-CM | POA: Diagnosis present

## 2017-04-27 DIAGNOSIS — I10 Essential (primary) hypertension: Secondary | ICD-10-CM | POA: Diagnosis present

## 2017-04-27 DIAGNOSIS — F3341 Major depressive disorder, recurrent, in partial remission: Secondary | ICD-10-CM | POA: Insufficient documentation

## 2017-05-10 DIAGNOSIS — M1712 Unilateral primary osteoarthritis, left knee: Secondary | ICD-10-CM | POA: Insufficient documentation

## 2017-05-10 DIAGNOSIS — Z0000 Encounter for general adult medical examination without abnormal findings: Secondary | ICD-10-CM

## 2017-10-05 ENCOUNTER — Encounter: Payer: Self-pay | Admitting: Internal Medicine

## 2017-11-12 ENCOUNTER — Encounter: Payer: Self-pay | Admitting: Internal Medicine

## 2018-01-11 DIAGNOSIS — F319 Bipolar disorder, unspecified: Secondary | ICD-10-CM | POA: Diagnosis present

## 2018-05-27 ENCOUNTER — Encounter (HOSPITAL_COMMUNITY): Payer: Self-pay

## 2018-05-27 ENCOUNTER — Ambulatory Visit (HOSPITAL_COMMUNITY)
Admission: RE | Admit: 2018-05-27 | Discharge: 2018-05-27 | Disposition: A | Discharge status: Home / Self Care | Payer: Medicaid Other | Attending: Psychiatry | Admitting: Psychiatry

## 2018-05-27 ENCOUNTER — Emergency Department (HOSPITAL_COMMUNITY): Payer: Medicaid Other

## 2018-05-27 ENCOUNTER — Emergency Department (HOSPITAL_COMMUNITY)
Admission: EM | Admit: 2018-05-27 | Discharge: 2018-05-27 | Disposition: A | Discharge status: Home / Self Care | Payer: Medicaid Other | Attending: Emergency Medicine | Admitting: Emergency Medicine

## 2018-05-27 DIAGNOSIS — J449 Chronic obstructive pulmonary disease, unspecified: Secondary | ICD-10-CM | POA: Insufficient documentation

## 2018-05-27 DIAGNOSIS — Z811 Family history of alcohol abuse and dependence: Secondary | ICD-10-CM | POA: Insufficient documentation

## 2018-05-27 DIAGNOSIS — Z836 Family history of other diseases of the respiratory system: Secondary | ICD-10-CM | POA: Insufficient documentation

## 2018-05-27 DIAGNOSIS — F17210 Nicotine dependence, cigarettes, uncomplicated: Secondary | ICD-10-CM | POA: Diagnosis not present

## 2018-05-27 DIAGNOSIS — E119 Type 2 diabetes mellitus without complications: Secondary | ICD-10-CM | POA: Insufficient documentation

## 2018-05-27 DIAGNOSIS — F1910 Other psychoactive substance abuse, uncomplicated: Secondary | ICD-10-CM | POA: Insufficient documentation

## 2018-05-27 DIAGNOSIS — E039 Hypothyroidism, unspecified: Secondary | ICD-10-CM | POA: Diagnosis not present

## 2018-05-27 DIAGNOSIS — F419 Anxiety disorder, unspecified: Secondary | ICD-10-CM | POA: Insufficient documentation

## 2018-05-27 DIAGNOSIS — I10 Essential (primary) hypertension: Secondary | ICD-10-CM | POA: Diagnosis not present

## 2018-05-27 DIAGNOSIS — Z79899 Other long term (current) drug therapy: Secondary | ICD-10-CM | POA: Diagnosis not present

## 2018-05-27 DIAGNOSIS — Z818 Family history of other mental and behavioral disorders: Secondary | ICD-10-CM | POA: Insufficient documentation

## 2018-05-27 DIAGNOSIS — J9610 Chronic respiratory failure, unspecified whether with hypoxia or hypercapnia: Secondary | ICD-10-CM | POA: Diagnosis not present

## 2018-05-27 DIAGNOSIS — F3132 Bipolar disorder, current episode depressed, moderate: Secondary | ICD-10-CM | POA: Diagnosis not present

## 2018-05-27 DIAGNOSIS — R05 Cough: Secondary | ICD-10-CM | POA: Diagnosis present

## 2018-05-27 DIAGNOSIS — I160 Hypertensive urgency: Secondary | ICD-10-CM

## 2018-05-27 LAB — URINALYSIS, ROUTINE W REFLEX MICROSCOPIC
BACTERIA UA: NONE SEEN
BILIRUBIN URINE: NEGATIVE
GLUCOSE, UA: NEGATIVE mg/dL
Hgb urine dipstick: NEGATIVE
Ketones, ur: 20 mg/dL — AB
NITRITE: NEGATIVE
Protein, ur: NEGATIVE mg/dL
SPECIFIC GRAVITY, URINE: 1.011 (ref 1.005–1.030)
pH: 7 (ref 5.0–8.0)

## 2018-05-27 LAB — BASIC METABOLIC PANEL
Anion gap: 10 (ref 5–15)
BUN: 7 mg/dL — ABNORMAL LOW (ref 8–23)
CO2: 27 mmol/L (ref 22–32)
Calcium: 9.3 mg/dL (ref 8.9–10.3)
Chloride: 103 mmol/L (ref 98–111)
Creatinine, Ser: 0.41 mg/dL — ABNORMAL LOW (ref 0.44–1.00)
Glucose, Bld: 95 mg/dL (ref 70–99)
Potassium: 3.5 mmol/L (ref 3.5–5.1)
SODIUM: 140 mmol/L (ref 135–145)

## 2018-05-27 LAB — CBC WITH DIFFERENTIAL/PLATELET
Abs Immature Granulocytes: 0.02 10*3/uL (ref 0.00–0.07)
Basophils Absolute: 0.1 10*3/uL (ref 0.0–0.1)
Basophils Relative: 1 %
EOS PCT: 3 %
Eosinophils Absolute: 0.2 10*3/uL (ref 0.0–0.5)
HEMATOCRIT: 48.7 % — AB (ref 36.0–46.0)
HEMOGLOBIN: 15.5 g/dL — AB (ref 12.0–15.0)
Immature Granulocytes: 0 %
LYMPHS ABS: 2.4 10*3/uL (ref 0.7–4.0)
LYMPHS PCT: 40 %
MCH: 27.8 pg (ref 26.0–34.0)
MCHC: 31.8 g/dL (ref 30.0–36.0)
MCV: 87.4 fL (ref 80.0–100.0)
MONO ABS: 0.4 10*3/uL (ref 0.1–1.0)
Monocytes Relative: 6 %
Neutro Abs: 3 10*3/uL (ref 1.7–7.7)
Neutrophils Relative %: 50 %
Platelets: 162 10*3/uL (ref 150–400)
RBC: 5.57 MIL/uL — ABNORMAL HIGH (ref 3.87–5.11)
RDW: 13.8 % (ref 11.5–15.5)
WBC: 6 10*3/uL (ref 4.0–10.5)
nRBC: 0 % (ref 0.0–0.2)

## 2018-05-27 MED ORDER — ALBUTEROL SULFATE (2.5 MG/3ML) 0.083% IN NEBU: 2.5000 mg | INHALATION_SOLUTION | Freq: Once | RESPIRATORY_TRACT | Status: DC

## 2018-05-27 MED ORDER — LORAZEPAM 1 MG PO TABS
1.0000 mg | ORAL_TABLET | Freq: Once | ORAL | Status: AC
  Administered 2018-05-27: 1 mg via ORAL
  Filled 2018-05-27 (×2): qty 1

## 2018-05-27 MED ORDER — LORAZEPAM 0.5 MG PO TABS: 0.5000 mg | ORAL_TABLET | Freq: Three times a day (TID) | ORAL | 0 refills | Status: DC | PRN

## 2018-05-27 MED ORDER — ALBUTEROL SULFATE (2.5 MG/3ML) 0.083% IN NEBU
5.0000 mg | INHALATION_SOLUTION | Freq: Once | RESPIRATORY_TRACT | Status: AC
  Administered 2018-05-27: 5 mg via RESPIRATORY_TRACT
  Filled 2018-05-27: qty 6

## 2018-05-27 NOTE — BH Assessment (Signed)
Per Priscille Loveless, NP, pt is to be transferred to [facility] due to high blood pressure reading. Pt to be seen by peer support to assist with connecting pt to tx.

## 2018-05-27 NOTE — ED Provider Notes (Signed)
[facility] DEPT Provider Note   CSN: [id number] Arrival date & time: 05/27/18  1459     History   Chief Complaint Chief Complaint  Patient presents with  . Cough  . Anxiety  . Altered Mental Status    HPI [name] [name] is a 63 y.o. female.  HPI Patient presents concern of anxiousness, despondency. Patient was at our behavioral health facility, sent here due to concern of hypertension. She knowledges history of anxiety, bipolar disorder, has been using Suboxone for about 1 year, last 3 days ago. She notes that over the past days, possibly week she has had worsening anxiety, despondency, without explicit suicidal ideation. No physical pain. With this concern she went for evaluation, was sent here after she was found to have notable hypertension. Patient denies history of hypertensive disease. She is a smoker, acknowledges history of pulmonary disease, states that she is otherwise medically well.  Past Medical History:  Diagnosis Date  . Anxiety   . Bipolar 1 disorder ([place])   . Cancer ([place])    colon  . Chronic diarrhea    Followed by Dr Michail Sermon   . Complication of anesthesia    Hx resp distress during surgery  . COPD (chronic obstructive pulmonary disease) ([place])   . Depression   . Diabetes mellitus ([place])   . GI bleed   . Headache(784.0)   . Hyperlipidemia   . Hypertension   . Hypothyroidism   . Insomnia   . Polysubstance abuse ([place]) Quit 2008    H/O methamphetamine and alcohol abuse, clean x 6 years  . Shortness of breath   . Suicidal thoughts   . Tobacco abuse     Patient Active Problem List   Diagnosis Date Noted  . COPD still smoking/ pft's pending  12/27/2012  . Chronic respiratory failure ([place]) 12/27/2012  . Personal history of colonic polyps 12/11/2012  . Chronic diarrhea 12/11/2012  . Benign neoplasm of colon 12/11/2012  . Migraine 12/04/2012  . Alcohol abuse 12/04/2012  . Gait instability 08/29/2012  . Tremor  08/29/2012  . Hyperlipidemia 07/08/2012  . Acute respiratory failure with hypoxia ([place]) 07/08/2012  . COPD exacerbation ([place]) 04/13/2012  . Acute-on-chronic respiratory failure ([place]) 04/13/2012  . Low back pain 04/13/2012  . Thrombocytopenia ([place]) 04/13/2012  . Non-insulin dependent type 2 diabetes mellitus ([place]) 04/13/2012  . Tobacco abuse 04/13/2012  . Hypertension   . Bipolar 1 disorder ([place])   . Insomnia   . Depression   . Suicidal thoughts     Past Surgical History:  Procedure Laterality Date  . ABDOMINAL HYSTERECTOMY    . APPENDECTOMY    . COLON SURGERY     "colon repair"  . COLONOSCOPY WITH PROPOFOL N/A 12/11/2012   Procedure: COLONOSCOPY WITH PROPOFOL;  Surgeon: Jerene Bears, MD;  Location: [facility];  Service: Gastroenterology;  Laterality: N/A;  . FINGER SURGERY     middle left finger  . HEMORRHOID SURGERY    . HERNIA REPAIR     x 3  . KNEE SURGERY     Left knee tendon surgery  . TONSILLECTOMY    . TUBAL LIGATION       OB History   No obstetric history on file.      Home Medications    Prior to Admission medications   Medication Sig Start Date End Date Taking? Authorizing Provider  albuterol (PROVENTIL) (5 MG/ML) 0.5% nebulizer solution Take 2.5 mg by nebulization every 6 (six) hours as needed for wheezing  or shortness of breath. 04/16/12   Bonnielee Haff, MD  amitriptyline (ELAVIL) 100 MG tablet Take 200 mg by mouth at bedtime.    [provider]  amLODipine (NORVASC) 5 MG tablet Take 5 mg by mouth daily.    [provider]  benztropine (COGENTIN) 0.5 MG tablet Take 0.5 mg by mouth at bedtime.    [provider]  busPIRone (BUSPAR) 10 MG tablet Take 10 mg by mouth 2 (two) times daily.    [provider]  divalproex (DEPAKOTE) 500 MG DR tablet Take 500 mg by mouth 2 (two) times daily. *Takes 500mg  in the morning and 1000mg  at bedtime*     [provider]  HYDROcodone-acetaminophen (NORCO/VICODIN) 5-325 MG  tablet Take 2 tablets by mouth every 4 (four) hours as needed for moderate pain.    [provider]  lovastatin (MEVACOR) 20 MG tablet Take 20 mg by mouth daily at 6 PM.    [provider]  metFORMIN (GLUCOPHAGE) 850 MG tablet Take 850 mg by mouth 2 (two) times daily with a meal.    [provider]  sertraline (ZOLOFT) 100 MG tablet Take 200 mg by mouth daily.     [provider]  traZODone (DESYREL) 50 MG tablet Take 50-100 mg by mouth at bedtime.    [provider]    Family History Family History  Problem Relation Age of Onset  . Multiple sclerosis Mother   . Alcoholism Father   . Depression Brother   . Emphysema Maternal Grandfather        smoked  . Breast cancer Neg Hx     Social History Social History   Tobacco Use  . Smoking status: Current Every Day Smoker    Packs/day: 1.00    Years: 42.00    Pack years: 42.00    Types: Cigarettes  . Smokeless tobacco: Never Used  Substance Use Topics  . Alcohol use: Yes    Comment: occ  . Drug use: No    Comment: high school - speed, hx prescription drugs 04/13/12 - no drug use now     Allergies   Diclofenac; Morphine and related; and Potassium-containing compounds   Review of Systems Review of Systems  Constitutional: Positive for unexpected weight change.  HENT:       Per HPI, otherwise negative  Respiratory:       Per HPI, otherwise negative  Cardiovascular:       Per HPI, otherwise negative  Gastrointestinal: Negative for vomiting.  Endocrine:       Negative aside from HPI  Genitourinary:       Neg aside from HPI   Musculoskeletal:       Per HPI, otherwise negative  Skin: Negative.   Neurological: Negative for syncope.  Psychiatric/Behavioral: Positive for decreased concentration, dysphoric mood and sleep disturbance. Negative for suicidal ideas. The patient is nervous/anxious.      Physical Exam Updated Vital Signs BP (!) 176/94 (BP Location: Left Arm)   Pulse  95   Temp 98.2 F (36.8 C) (Oral)   Resp 17   Ht 5\' 5"  (1.651 m)   Wt 65.3 kg   SpO2 91%   BMI 23.96 kg/m   Physical Exam Vitals signs and nursing note reviewed.  Constitutional:      General: She is not in acute distress.    Appearance: She is well-developed.  HENT:     Head: Normocephalic and atraumatic.  Eyes:     Conjunctiva/sclera: Conjunctivae normal.  Cardiovascular:     Rate and Rhythm: Normal rate and regular rhythm.  Pulmonary:     Effort: Pulmonary effort is normal. No respiratory distress.     Breath sounds: Normal breath sounds. No stridor.  Abdominal:     General: There is no distension.  Skin:    General: Skin is warm and dry.  Neurological:     Mental Status: She is alert and oriented to person, place, and time.     Cranial Nerves: No cranial nerve deficit.  Psychiatric:        Mood and Affect: Mood is anxious.        Thought Content: Thought content does not include suicidal ideation.        Cognition and Memory: Cognition is not impaired.      ED Treatments / Results  Labs (all labs ordered are listed, but only abnormal results are displayed) Labs Reviewed  BASIC METABOLIC PANEL  CBC WITH DIFFERENTIAL/PLATELET  URINALYSIS, ROUTINE W REFLEX MICROSCOPIC    EKG None  Radiology Dg Chest 2 View  Result Date: 05/27/2018 CLINICAL DATA:  Pt here with c/o anxiety, cough, SOB. H/o COPD, Diabetes, HTN. Smoker. EXAM: CHEST - 2 VIEW COMPARISON:  04/12/2016 FINDINGS: Cardiac silhouette is normal in size. No mediastinal or hilar masses. There is no evidence of adenopathy. Lungs are hyperexpanded but clear. No pleural effusion or pneumothorax. Skeletal structures are intact. IMPRESSION: No active cardiopulmonary disease. Electronically Signed   By: Lajean Manes M.D.   On: 05/27/2018 15:42    Procedures Procedures (including critical care time)  Medications Ordered in ED Medications  LORazepam (ATIVAN) tablet 1 mg (has no administration in time range)    albuterol (PROVENTIL) (2.5 MG/3ML) 0.083% nebulizer solution 5 mg (5 mg Nebulization Given 05/27/18 1543)     Initial Impression / Assessment and Plan / ED Course  I have reviewed the triage vital signs and the nursing notes.  Pertinent labs & imaging results that were available during my care of the patient were reviewed by me and considered in my medical decision making (see chart for details).    After the initial evaluation I reviewed the patient's chart. Notable findings include documentation from behavioral health suggesting the patient is appropriate for outpatient follow-up for concern for Suboxone withdrawal, anxiety.  7:44 PM  Patient feels substantially better.  She has been seen and evaluated by our behavioral health colleagues as well. With no evidence for endorgan effects, some suspicion for hypertension secondary to withdrawal, patient is appropriate medically for discharge with close outpatient follow-up with behavioral health resources, provided to her.   Final Clinical Impressions(s) / ED Diagnoses  Hypertensive urgency Anxiousness   Carmin Muskrat, MD 05/27/18 1945

## 2018-05-27 NOTE — ED Triage Notes (Addendum)
Pt presents with c/o anxiety and altered mental status. Pt reports that she was at [facility] and was sent over here for her BP. Pt also reports anxiety and depression but denies any suicidal thoughts. Pt is coughing as well at this time, 90% on RA. Pt reports COPD. Pt says she is experiencing some confusion but is alert and able to answer questions. Pt says she is tired.

## 2018-05-27 NOTE — Patient Outreach (Signed)
ED Peer Support Specialist Patient Intake (Complete at intake & 30-60 Day Follow-up)  Name: [name] [name]  MRN: [id number]  Age: 63 y.o.   Date of Admission: 05/27/2018  Intake: Initial Comments:      Primary Reason Admitted: symptoms of anxiety, suboxone withdrawal, & depression. Pt has a history of substance abuse   Lab values: Alcohol/ETOH:   Positive UDS?   Amphetamines:   Barbiturates:   Benzodiazepines:   Cocaine:   Opiates:   Cannabinoids:    Demographic information: Gender: Female Ethnicity: White Marital Status: Divorced Insurance Status: Best boy (Work Neurosurgeon, Physicist, medical, etc.: No Lives with: Alone Living situation: House/Apartment  Reported Patient History: Patient reported health conditions: Depression, Bipolar disorder, Anxiety disorders Patient aware of HIV and hepatitis status: No  In past year, has patient visited ED for any reason? No  Number of ED visits:    Reason(s) for visit:    In past year, has patient been hospitalized for any reason? No  Number of hospitalizations:    Reason(s) for hospitalization:    In past year, has patient been arrested? No  Number of arrests:    Reason(s) for arrest:    In past year, has patient been incarcerated? No  Number of incarcerations:    Reason(s) for incarceration:    In past year, has patient received medication-assisted treatment? No  In past year, patient received the following treatments:    In past year, has patient received any harm reduction services? No  Did this include any of the following?    In past year, has patient received care from a mental health provider for diagnosis other than SUD? Yes(Receives assistance at [employer])  In past year, is this first time patient has overdosed? No  Number of past overdoses:    In past year, is this first time patient has been hospitalized for an overdose? No  Number of hospitalizations for  overdose(s):    Is patient currently receiving treatment for a mental health diagnosis? No(Treamtent received at [facility]  Patient reports experiencing difficulty participating in SUD treatment: No    Most important reason(s) for this difficulty?    Has patient received prior services for treatment? No  In past, patient has received services from following agencies:    Plan of Care:  Suggested follow up at these agencies/treatment centers: (Receive help for substance Abuse)  Other information: CPSS met with Pt and was able to gain information try to assist Pt with seeking outpatient services. CPSS was made aware that Pt does receive services from [facility] and speaks with a counselor for substance. CPSS processed with Pt with Substance based intervention along with issuing Pt [facility] contact information to better assist Pt with Suboxone addition.     Aaron Edelman Vittoria Noreen, CPSS  05/27/2018 6:56 PM

## 2018-05-27 NOTE — Progress Notes (Signed)
Patient ID: [name] [name], female   DOB: 20-May-1956, 63 y.o.   MRN: [id number]  Pt was seen by Peer Support, Aaron Edelman. Pt has been given information for substance abuse treatment programs in the community. Pt was a walk-in at [facility] and sent to [facility] due to high blood pressure and wanting to speak to Peer Support. Pt has multiple services in the community for therapy and medication management. Pt is seen at [facility]. Pt is psychiatrically clear and may be discharged when medically clear.   Ethelene Hal, NP-C 05-27-2018      [phone]
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
## 2018-05-27 NOTE — H&P (Signed)
Behavioral Health Medical Screening Exam  [name] [name] is an 63 y.o. female with complaints of worsening depression, anxiety, and some paranoia 2/t substance abuse. She reports previous illegal use of suboxone, and due to limited supply began to supplement with additional polysubstance (cocaine and meth). At this time she denies suicidal ideation, homicidal ideations, and or auditory visual hallucinations. She has a history of bipolar disorder, depression, and anxiety and is under the care of dr. Candi Leash at [place]. She reports compliance with her outpatient therapy and medication adherence as directed to include Depakote, Seroquel, benztropine, Zoloft, amitriptyline, buspirone. Despite her having a good therapeutic alliance with her psychiatrist she reports no concordance with him and has been providing him misleading information regarding her history. She is seeking assistance with drug withdrawal and rehab at this time.   Total Time spent with patient: 30 minutes  Psychiatric Specialty Exam: Physical Exam  Nursing note and vitals reviewed. Constitutional: She is oriented to person, place, and time. She appears well-developed.  HENT:  Head: Normocephalic.  Eyes: Pupils are equal, round, and reactive to light.  Neck: Normal range of motion.  Musculoskeletal: Normal range of motion.  Neurological: She is alert and oriented to person, place, and time.  Skin: Skin is warm and dry.    ROS  Blood pressure (!) 183/97, pulse 97, temperature 99.1 F (37.3 C), temperature source Oral, resp. rate 18, SpO2 97 %.There is no height or weight on file to calculate BMI.  General Appearance: Fairly Groomed  Eye Contact:  Fair  Speech:  Clear and Coherent and Pressured  Volume:  Normal  Mood:  Anxious and Depressed  Affect:  Congruent and Depressed  Thought Process:  Linear and Descriptions of Associations: Circumstantial  Orientation:  Full (Time, Place, and Person)  Thought Content:  Logical and  Tangential  Suicidal Thoughts:  No  Homicidal Thoughts:  No  Memory:  Immediate;   Fair Recent;   Fair  Judgement:  Impaired  Insight:  Shallow  Psychomotor Activity:  Increased and Tremor  Concentration: Concentration: Fair and Attention Span: Fair  Recall:  [place]: Fair  Akathisia:  No  Handed:  Right  AIMS (if indicated):     Assets:  Communication Skills Desire for Improvement Financial Resources/Insurance Housing Leisure Time  Sleep:       Musculoskeletal: Strength & Muscle Tone: within normal limits Gait & Station: normal Patient leans: N/A  Blood pressure (!) 183/97, pulse 97, temperature 99.1 F (37.3 C), temperature source Oral, resp. rate 18, SpO2 97 %.  Recommendations:  Based on my evaluation the patient appears to have an emergency medical condition for which I recommend the patient be transferred to the emergency department for further evaluation. will recommend consult with peer support once patient is medically cleared. Peer support to assist with long term placement for drug detox and stability. Patient denies any suicidal thoughts at this time.   Suella Broad, FNP 05/27/2018, 2:49 PM

## 2018-05-27 NOTE — ED Notes (Signed)
Bed: [facility] Expected date:  Expected time:  Means of arrival:  Comments: 

## 2018-05-27 NOTE — Discharge Instructions (Signed)
Please be sure to use the provided resources and follow-up tomorrow with our outpatient counseling affiliates.  You have been provided a short course of medication to assist with your anxiety, which is likely due at least in part to your withdrawal. Returning for concerning changes in your condition.

## 2018-06-12 DIAGNOSIS — Z6830 Body mass index (BMI) 30.0-30.9, adult: Secondary | ICD-10-CM | POA: Insufficient documentation

## 2018-09-26 ENCOUNTER — Encounter: Payer: Self-pay | Admitting: *Deleted

## 2018-09-27 ENCOUNTER — Encounter: Payer: Self-pay | Admitting: Internal Medicine

## 2018-09-27 ENCOUNTER — Ambulatory Visit (INDEPENDENT_AMBULATORY_CARE_PROVIDER_SITE_OTHER): Payer: Medicaid Other | Admitting: Internal Medicine

## 2018-09-27 ENCOUNTER — Other Ambulatory Visit: Payer: Self-pay

## 2018-09-27 VITALS — Ht 65.0 in | Wt 160.0 lb

## 2018-09-27 DIAGNOSIS — R1110 Vomiting, unspecified: Secondary | ICD-10-CM

## 2018-09-27 DIAGNOSIS — R112 Nausea with vomiting, unspecified: Secondary | ICD-10-CM | POA: Diagnosis not present

## 2018-09-27 DIAGNOSIS — R634 Abnormal weight loss: Secondary | ICD-10-CM

## 2018-09-27 DIAGNOSIS — Z86010 Personal history of colonic polyps: Secondary | ICD-10-CM

## 2018-09-27 DIAGNOSIS — R140 Abdominal distension (gaseous): Secondary | ICD-10-CM
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
## 2018-09-27 NOTE — Patient Instructions (Addendum)
Please schedule endoscopy and colonoscopy

## 2018-10-01 ENCOUNTER — Telehealth: Payer: Self-pay | Admitting: *Deleted

## 2018-10-01 NOTE — Telephone Encounter (Signed)
Left message for patient to call back  

## 2018-10-01 NOTE — Telephone Encounter (Signed)
-----   Message from Larina Bras, [place] sent at 09/27/2018  6:27 PM EDT ----- Pt needs endo/colon. See 09/27/18 televisit

## 2018-10-03 NOTE — Telephone Encounter (Signed)
Left voicemail for patient to call back. 

## 2018-10-04 NOTE — Telephone Encounter (Signed)
Again left voicemail for patient to call back. At this point, we will await her return call before contacting her any further (we have called her 3 times with no return call).

## 2019-02-27 ENCOUNTER — Other Ambulatory Visit: Payer: Self-pay

## 2019-02-27 DIAGNOSIS — Z20822 Contact with and (suspected) exposure to covid-19: Secondary | ICD-10-CM

## 2019-03-01 LAB — NOVEL CORONAVIRUS, NAA: SARS-CoV-2, NAA: NOT DETECTED

## 2020-01-10 ENCOUNTER — Emergency Department (HOSPITAL_COMMUNITY): Payer: Medicaid Other

## 2020-01-10 ENCOUNTER — Encounter (HOSPITAL_COMMUNITY): Payer: Self-pay | Admitting: Emergency Medicine

## 2020-01-10 ENCOUNTER — Emergency Department (HOSPITAL_COMMUNITY)
Admission: EM | Admit: 2020-01-10 | Discharge: 2020-01-10 | Disposition: A | Discharge status: Home / Self Care | Payer: Medicaid Other | Attending: Emergency Medicine | Admitting: Emergency Medicine

## 2020-01-10 DIAGNOSIS — E039 Hypothyroidism, unspecified: Secondary | ICD-10-CM | POA: Diagnosis not present

## 2020-01-10 DIAGNOSIS — I10 Essential (primary) hypertension: Secondary | ICD-10-CM | POA: Diagnosis not present

## 2020-01-10 DIAGNOSIS — Z85038 Personal history of other malignant neoplasm of large intestine: Secondary | ICD-10-CM | POA: Diagnosis not present

## 2020-01-10 DIAGNOSIS — F17210 Nicotine dependence, cigarettes, uncomplicated: Secondary | ICD-10-CM | POA: Diagnosis not present

## 2020-01-10 DIAGNOSIS — E119 Type 2 diabetes mellitus without complications: Secondary | ICD-10-CM | POA: Insufficient documentation

## 2020-01-10 DIAGNOSIS — R21 Rash and other nonspecific skin eruption: Secondary | ICD-10-CM | POA: Diagnosis present

## 2020-01-10 DIAGNOSIS — Z7984 Long term (current) use of oral hypoglycemic drugs: Secondary | ICD-10-CM | POA: Diagnosis not present

## 2020-01-10 DIAGNOSIS — J449 Chronic obstructive pulmonary disease, unspecified: Secondary | ICD-10-CM | POA: Insufficient documentation

## 2020-01-10 DIAGNOSIS — T7840XA Allergy, unspecified, initial encounter: Secondary | ICD-10-CM | POA: Diagnosis not present

## 2020-01-10 DIAGNOSIS — Z79899 Other long term (current) drug therapy: Secondary | ICD-10-CM | POA: Insufficient documentation

## 2020-01-10 LAB — CBC WITH DIFFERENTIAL/PLATELET
Abs Immature Granulocytes: 0.23 10*3/uL — ABNORMAL HIGH (ref 0.00–0.07)
Basophils Absolute: 0 10*3/uL (ref 0.0–0.1)
Basophils Relative: 1 %
Eosinophils Absolute: 0.1 10*3/uL (ref 0.0–0.5)
Eosinophils Relative: 1 %
HCT: 47.1 % — ABNORMAL HIGH (ref 36.0–46.0)
Hemoglobin: 14.5 g/dL (ref 12.0–15.0)
Immature Granulocytes: 3 %
Lymphocytes Relative: 34 %
Lymphs Abs: 2.4 10*3/uL (ref 0.7–4.0)
MCH: 28.9 pg (ref 26.0–34.0)
MCHC: 30.8 g/dL (ref 30.0–36.0)
MCV: 93.8 fL (ref 80.0–100.0)
Monocytes Absolute: 0.4 10*3/uL (ref 0.1–1.0)
Monocytes Relative: 5 %
Neutro Abs: 4 10*3/uL (ref 1.7–7.7)
Neutrophils Relative %: 56 %
Platelets: 251 10*3/uL (ref 150–400)
RBC: 5.02 MIL/uL (ref 3.87–5.11)
RDW: 13.9 % (ref 11.5–15.5)
WBC: 7.1 10*3/uL (ref 4.0–10.5)
nRBC: 0 % (ref 0.0–0.2)

## 2020-01-10 LAB — COMPREHENSIVE METABOLIC PANEL
ALT: 10 U/L (ref 0–44)
AST: 11 U/L — ABNORMAL LOW (ref 15–41)
Albumin: 3.8 g/dL (ref 3.5–5.0)
Alkaline Phosphatase: 74 U/L (ref 38–126)
Anion gap: 11 (ref 5–15)
BUN: 12 mg/dL (ref 8–23)
CO2: 29 mmol/L (ref 22–32)
Calcium: 9.2 mg/dL (ref 8.9–10.3)
Chloride: 98 mmol/L (ref 98–111)
Creatinine, Ser: 0.44 mg/dL (ref 0.44–1.00)
GFR calc Af Amer: >60 mL/min (ref >60)
GFR calc non Af Amer: >60 mL/min (ref >60)
Glucose, Bld: 154 mg/dL — ABNORMAL HIGH (ref 70–99)
Potassium: 4 mmol/L (ref 3.5–5.1)
Sodium: 138 mmol/L (ref 135–145)
Total Bilirubin: 0.3 mg/dL (ref 0.3–1.2)
Total Protein: 8.1 g/dL (ref 6.5–8.1)

## 2020-01-10 MED ORDER — SODIUM CHLORIDE 0.9 % IV BOLUS
1000.0000 mL | Freq: Once | INTRAVENOUS | Status: AC
  Administered 2020-01-10: 1000 mL via INTRAVENOUS

## 2020-01-10 MED ORDER — METHYLPREDNISOLONE SODIUM SUCC 125 MG IJ SOLR
125.0000 mg | Freq: Once | INTRAMUSCULAR | Status: AC
Start: 2020-01-10 — End: 2020-01-10
  Administered 2020-01-10: 125 mg via INTRAVENOUS
  Filled 2020-01-10: qty 2

## 2020-01-10 MED ORDER — FAMOTIDINE IN NACL 20-0.9 MG/50ML-% IV SOLN
20.0000 mg | Freq: Once | INTRAVENOUS | Status: AC
  Administered 2020-01-10: 20 mg via INTRAVENOUS
  Filled 2020-01-10: qty 50

## 2020-01-10 MED ORDER — EPINEPHRINE 0.3 MG/0.3ML IJ SOAJ
0.3000 mg | Freq: Once | INTRAMUSCULAR | Status: AC
  Administered 2020-01-10: 0.3 mg via INTRAMUSCULAR
  Filled 2020-01-10: qty 0.3

## 2020-01-10 MED ORDER — LORAZEPAM 2 MG/ML IJ SOLN
0.5000 mg | Freq: Once | INTRAMUSCULAR | Status: AC
  Administered 2020-01-10: 0.5 mg via INTRAVENOUS
  Filled 2020-01-10: qty 1

## 2020-01-10 MED ORDER — FAMOTIDINE 20 MG PO TABS: 20.0000 mg | ORAL_TABLET | Freq: Two times a day (BID) | ORAL | 0 refills | Status: DC

## 2020-01-10 MED ORDER — DIPHENHYDRAMINE HCL 50 MG/ML IJ SOLN
25.0000 mg | Freq: Once | INTRAMUSCULAR | Status: AC
  Administered 2020-01-10: 25 mg via INTRAVENOUS
  Filled 2020-01-10: qty 1

## 2020-01-10 MED ORDER — EPINEPHRINE 0.3 MG/0.3ML IJ SOAJ
0.3000 mg | Freq: Once | INTRAMUSCULAR | Status: AC
Start: 2020-01-10 — End: 2020-01-10
  Administered 2020-01-10: 0.3 mg via INTRAMUSCULAR

## 2020-01-10 MED ORDER — DIPHENHYDRAMINE HCL 50 MG/ML IJ SOLN
50.0000 mg | Freq: Once | INTRAMUSCULAR | Status: AC
  Administered 2020-01-10: 50 mg via INTRAVENOUS
  Filled 2020-01-10: qty 1

## 2020-01-10 MED ORDER — ALBUTEROL SULFATE HFA 108 (90 BASE) MCG/ACT IN AERS
2.0000 | INHALATION_SPRAY | Freq: Once | RESPIRATORY_TRACT | Status: AC
  Administered 2020-01-10: 2 via RESPIRATORY_TRACT
  Filled 2020-01-10: qty 6.7

## 2020-01-10 NOTE — ED Provider Notes (Signed)
[facility] EMERGENCY DEPARTMENT Provider Note   CSN: [id number] Arrival date & time: 01/10/20  1836     History Chief Complaint  Patient presents with  . Allergic Reaction    [name] [name] is a 64 y.o. female.  Patient complains of itching and shortness of breath after taking her daughter's medicine  The history is provided by the patient and medical records. No language interpreter was used.  Allergic Reaction Presenting symptoms: difficulty breathing and rash   Severity:  Severe Prior allergic episodes:  No prior episodes Context: not animal exposure   Relieved by:  Nothing Worsened by:  Nothing Ineffective treatments:  Antihistamines      Past Medical History:  Diagnosis Date  . Anxiety   . Bipolar 1 disorder ([place])   . Cancer ([place])    colon  . Chronic diarrhea    Followed by Dr Michail Sermon   . Complication of anesthesia    Hx resp distress during surgery  . COPD (chronic obstructive pulmonary disease) ([place])   . Depression   . Diabetes mellitus ([place])   . External hemorrhoids   . GI bleed   . Headache(784.0)   . Hyperlipidemia   . Hypertension   . Hypothyroidism   . Insomnia   . Internal hemorrhoids   . Polysubstance abuse ([place]) Quit 2008    H/O methamphetamine and alcohol abuse, clean x 6 years  . Shortness of breath   . Suicidal thoughts   . Tobacco abuse   . Tubular adenoma of colon     Patient Active Problem List   Diagnosis Date Noted  . COPD still smoking/ pft's pending  12/27/2012  . Chronic respiratory failure ([place]) 12/27/2012  . Personal history of colonic polyps 12/11/2012  . Chronic diarrhea 12/11/2012  . Benign neoplasm of colon 12/11/2012  . Migraine 12/04/2012  . Alcohol abuse 12/04/2012  . Gait instability 08/29/2012  . Tremor 08/29/2012  . Hyperlipidemia 07/08/2012  . Acute respiratory failure with hypoxia ([place]) 07/08/2012  . COPD exacerbation ([place]) 04/13/2012  . Acute-on-chronic respiratory failure ([place]) 04/13/2012  . Low  back pain 04/13/2012  . Thrombocytopenia ([place]) 04/13/2012  . Non-insulin dependent type 2 diabetes mellitus ([place]) 04/13/2012  . Tobacco abuse 04/13/2012  . Hypertension   . Bipolar 1 disorder ([place])   . Insomnia   . Depression   . Suicidal thoughts     Past Surgical History:  Procedure Laterality Date  . ABDOMINAL HYSTERECTOMY    . APPENDECTOMY    . COLON SURGERY     "colon repair"  . COLONOSCOPY WITH PROPOFOL N/A 12/11/2012   Procedure: COLONOSCOPY WITH PROPOFOL;  Surgeon: Jerene Bears, MD;  Location: [facility] ENDOSCOPY;  Service: Gastroenterology;  Laterality: N/A;  . FINGER SURGERY     middle left finger  . HERNIA REPAIR     x 2  . KNEE SURGERY Left    Left knee tendon surgery  . TONSILLECTOMY    . TUBAL LIGATION       OB History   No obstetric history on file.     Family History  Problem Relation Age of Onset  . Multiple sclerosis Mother   . Alcoholism Father   . Depression Brother   . Emphysema Maternal Grandfather        smoked  . Breast cancer Neg Hx   . Colon cancer Neg Hx   . Esophageal cancer Neg Hx   . Colon polyps Neg Hx   . Stomach cancer Neg Hx   .  Pancreatic cancer Neg Hx   . Liver disease Neg Hx     Social History   Tobacco Use  . Smoking status: Current Every Day Smoker    Packs/day: 1.00    Years: 42.00    Pack years: 42.00    Types: Cigarettes  . Smokeless tobacco: Never Used  Substance Use Topics  . Alcohol use: Yes    Comment: occ  . Drug use: No    Comment: high school - speed, hx prescription drugs 04/13/12 - no drug use now    Home Medications Prior to Admission medications   Medication Sig Start Date End Date Taking? Authorizing Provider  albuterol (PROVENTIL) (5 MG/ML) 0.5% nebulizer solution Take 2.5 mg by nebulization every 6 (six) hours as needed for wheezing or shortness of breath. 04/16/12  Yes Bonnielee Haff, MD  amitriptyline (ELAVIL) 100 MG tablet Take 100 mg by mouth at bedtime.    Yes [provider]    amLODipine (NORVASC) 5 MG tablet Take 5 mg by mouth daily.   Yes [provider]  benztropine (COGENTIN) 0.5 MG tablet Take 0.5 mg by mouth 2 (two) times daily. 12/11/19  Yes [provider]  busPIRone (BUSPAR) 10 MG tablet Take 10 mg by mouth 3 (three) times daily.    Yes [provider]  divalproex (DEPAKOTE) 500 MG DR tablet Take 500 mg by mouth 2 (two) times daily.    Yes [provider]  metFORMIN (GLUCOPHAGE) 850 MG tablet Take 1 tablet by mouth in the morning and at bedtime. 04/27/17  Yes [provider]  QUEtiapine (SEROQUEL) 50 MG tablet Take 50 mg by mouth 3 (three) times daily.    Yes [provider]  sertraline (ZOLOFT) 100 MG tablet Take 200 mg by mouth daily.    Yes [provider]  SPIRIVA HANDIHALER 18 MCG inhalation capsule Place 1 capsule into inhaler and inhale daily.  12/17/19  Yes [provider]  famotidine (PEPCID) 20 MG tablet Take 1 tablet (20 mg total) by mouth 2 (two) times daily. 01/10/20   Milton Ferguson, MD    Allergies    Bactrim [sulfamethoxazole-trimethoprim], Amoxicillin, Diclofenac, Morphine and related, and Potassium-containing compounds  Review of Systems   Review of Systems  Constitutional: Negative for appetite change and fatigue.  HENT: Negative for congestion, ear discharge and sinus pressure.   Eyes: Negative for discharge.  Respiratory: Positive for shortness of breath. Negative for cough.   Cardiovascular: Negative for chest pain.  Gastrointestinal: Negative for abdominal pain and diarrhea.  Genitourinary: Negative for frequency and hematuria.  Musculoskeletal: Negative for back pain.  Skin: Positive for rash.  Neurological: Negative for seizures and headaches.  Psychiatric/Behavioral: Negative for hallucinations.    Physical Exam Updated Vital Signs BP (!) 154/84   Pulse 97   Temp 99 F (37.2 C) (Oral)   Resp 14   Ht 5\' 5"  (1.651 m)   Wt 99.8 kg   SpO2 92%   BMI  36.61 kg/m   Physical Exam Vitals and nursing note reviewed.  Constitutional:      General: She is in acute distress.     Appearance: She is well-developed.  HENT:     Head: Normocephalic.     Nose: Nose normal.  Eyes:     General: No scleral icterus.    Conjunctiva/sclera: Conjunctivae normal.  Neck:     Thyroid: No thyromegaly.  Cardiovascular:     Rate and Rhythm: Normal rate and regular rhythm.  Heart sounds: No murmur heard.  No friction rub. No gallop.   Pulmonary:     Effort: Respiratory distress present.     Breath sounds: No stridor. No wheezing or rales.  Chest:     Chest wall: No tenderness.  Abdominal:     General: There is no distension.     Tenderness: There is no abdominal tenderness. There is no rebound.  Musculoskeletal:        General: Normal range of motion.     Cervical back: Neck supple.  Lymphadenopathy:     Cervical: No cervical adenopathy.  Skin:    Findings: Rash present. No erythema.  Neurological:     Mental Status: She is oriented to person, place, and time.     Motor: No abnormal muscle tone.     Coordination: Coordination normal.  Psychiatric:        Behavior: Behavior normal.     ED Results / Procedures / Treatments   Labs (all labs ordered are listed, but only abnormal results are displayed) Labs Reviewed  CBC WITH DIFFERENTIAL/PLATELET - Abnormal; Notable for the following components:      Result Value   HCT 47.1 (*)    Abs Immature Granulocytes 0.23 (*)    All other components within normal limits  COMPREHENSIVE METABOLIC PANEL - Abnormal; Notable for the following components:   Glucose, Bld 154 (*)    AST 11 (*)    All other components within normal limits    EKG None  Radiology DG Chest Port 1 View  Result Date: 01/10/2020 CLINICAL DATA:  Allergic reaction to an antibiotic. EXAM: PORTABLE CHEST 1 VIEW COMPARISON:  PA and lateral chest 05/27/2018. FINDINGS: The lungs are emphysematous but clear. Heart size is  normal. No pneumothorax or pleural effusion. Remote left seventh rib fracture noted. IMPRESSION: No acute disease. Electronically Signed   By: Inge Rise M.D.   On: 01/10/2020 19:37    Procedures Procedures (including critical care time)  Medications Ordered in ED Medications  diphenhydrAMINE (BENADRYL) injection 50 mg (50 mg Intravenous Given 01/10/20 1852)  methylPREDNISolone sodium succinate (SOLU-MEDROL) 125 mg/2 mL injection 125 mg (125 mg Intravenous Given 01/10/20 1852)  famotidine (PEPCID) IVPB 20 mg premix (0 mg Intravenous Stopped 01/10/20 1955)  EPINEPHrine (EPI-PEN) injection 0.3 mg (0.3 mg Intramuscular Given 01/10/20 1848)  albuterol (VENTOLIN HFA) 108 (90 Base) MCG/ACT inhaler 2 puff (2 puffs Inhalation Given 01/10/20 1852)  LORazepam (ATIVAN) injection 0.5 mg (0.5 mg Intravenous Given 01/10/20 1853)  sodium chloride 0.9 % bolus 1,000 mL (0 mLs Intravenous Stopped 01/10/20 1954)  EPINEPHrine (EPI-PEN) injection 0.3 mg (0.3 mg Intramuscular Given 01/10/20 2001)  diphenhydrAMINE (BENADRYL) injection 25 mg (25 mg Intravenous Given 01/10/20 1959)    ED Course  I have reviewed the triage vital signs and the nursing notes.  Pertinent labs & imaging results that were available during my care of the patient were reviewed by me and considered in my medical decision making (see chart for details).    MDM Rules/Calculators/A&P                         Patient with an allergic reaction that responded to medicine she is discharged home with Pepcid and told to take Benadryl she cannot take prednisone      This patient presents to the ED for concern of rash and itching this involves an extensive number of treatment options, and is a complaint that carries with it a  high risk of complications and morbidity.  The differential diagnosis includes allergic reaction   Lab Tests:   I Ordered, reviewed, and interpreted labs, which included CBC and chemistries which were  unremarkable  Medicines ordered:   I ordered medication Benadryl epinephrine steroids  Imaging Studies ordered:   I ordered imaging studies which included chest x-ray  I independently visualized and interpreted imaging which showed no acute disease  Additional history obtained:   Additional history obtained from records  Previous records obtained and reviewed.  Consultations Obtained:     Reevaluation:  After the interventions stated above, I reevaluated the patient and found improved  Critical Interventions:  .    Final Clinical Impression(s) / ED Diagnoses Final diagnoses:  Allergic reaction, initial encounter    Rx / DC Orders ED Discharge Orders         Ordered    famotidine (PEPCID) 20 MG tablet  2 times daily        01/10/20 2322           Milton Ferguson, MD 01/13/20 1124

## 2020-01-10 NOTE — ED Triage Notes (Signed)
Pt reports she took her daughters antibiotic and is having allergic reaction

## 2020-01-10 NOTE — ED Notes (Signed)
Pt O2 on 4L [place], sats 91%

## 2020-01-10 NOTE — ED Triage Notes (Signed)
Emergency Medicine Provider Triage Evaluation Note     Milton Ferguson, MD 01/13/20 1214

## 2020-01-10 NOTE — Discharge Instructions (Addendum)
Take Benadryl 4 times a day 25 mg. Follow-up with your doctor next week

## 2020-01-26 ENCOUNTER — Encounter (HOSPITAL_COMMUNITY): Payer: Self-pay

## 2020-01-26 ENCOUNTER — Emergency Department (HOSPITAL_COMMUNITY): Payer: Medicaid Other

## 2020-01-26 ENCOUNTER — Other Ambulatory Visit: Payer: Self-pay

## 2020-01-26 ENCOUNTER — Inpatient Hospital Stay (HOSPITAL_COMMUNITY)
Admission: EM | Admit: 2020-01-26 | Discharge: 2020-02-27 | DRG: 870 | Disposition: A | Discharge status: Home Health | Payer: Medicaid Other | Attending: Internal Medicine | Admitting: Internal Medicine

## 2020-01-26 DIAGNOSIS — I96 Gangrene, not elsewhere classified: Secondary | ICD-10-CM | POA: Diagnosis present

## 2020-01-26 DIAGNOSIS — L97919 Non-pressure chronic ulcer of unspecified part of right lower leg with unspecified severity: Secondary | ICD-10-CM | POA: Diagnosis not present

## 2020-01-26 DIAGNOSIS — Z09 Encounter for follow-up examination after completed treatment for conditions other than malignant neoplasm: Secondary | ICD-10-CM

## 2020-01-26 DIAGNOSIS — Z882 Allergy status to sulfonamides status: Secondary | ICD-10-CM

## 2020-01-26 DIAGNOSIS — J440 Chronic obstructive pulmonary disease with acute lower respiratory infection: Secondary | ICD-10-CM | POA: Diagnosis not present

## 2020-01-26 DIAGNOSIS — E662 Morbid (severe) obesity with alveolar hypoventilation: Secondary | ICD-10-CM | POA: Diagnosis present

## 2020-01-26 DIAGNOSIS — J9589 Other postprocedural complications and disorders of respiratory system, not elsewhere classified: Secondary | ICD-10-CM | POA: Diagnosis not present

## 2020-01-26 DIAGNOSIS — G9341 Metabolic encephalopathy: Secondary | ICD-10-CM | POA: Diagnosis not present

## 2020-01-26 DIAGNOSIS — J9600 Acute respiratory failure, unspecified whether with hypoxia or hypercapnia: Secondary | ICD-10-CM | POA: Diagnosis not present

## 2020-01-26 DIAGNOSIS — Z820 Family history of epilepsy and other diseases of the nervous system: Secondary | ICD-10-CM

## 2020-01-26 DIAGNOSIS — J9622 Acute and chronic respiratory failure with hypercapnia: Secondary | ICD-10-CM | POA: Diagnosis present

## 2020-01-26 DIAGNOSIS — R112 Nausea with vomiting, unspecified: Secondary | ICD-10-CM

## 2020-01-26 DIAGNOSIS — Z885 Allergy status to narcotic agent status: Secondary | ICD-10-CM

## 2020-01-26 DIAGNOSIS — K219 Gastro-esophageal reflux disease without esophagitis: Secondary | ICD-10-CM | POA: Diagnosis not present

## 2020-01-26 DIAGNOSIS — Z978 Presence of other specified devices: Secondary | ICD-10-CM

## 2020-01-26 DIAGNOSIS — M726 Necrotizing fasciitis: Secondary | ICD-10-CM | POA: Diagnosis present

## 2020-01-26 DIAGNOSIS — J9602 Acute respiratory failure with hypercapnia: Secondary | ICD-10-CM | POA: Diagnosis not present

## 2020-01-26 DIAGNOSIS — F10239 Alcohol dependence with withdrawal, unspecified: Secondary | ICD-10-CM | POA: Diagnosis present

## 2020-01-26 DIAGNOSIS — E039 Hypothyroidism, unspecified: Secondary | ICD-10-CM | POA: Diagnosis present

## 2020-01-26 DIAGNOSIS — L03115 Cellulitis of right lower limb: Secondary | ICD-10-CM | POA: Diagnosis present

## 2020-01-26 DIAGNOSIS — A401 Sepsis due to streptococcus, group B: Secondary | ICD-10-CM | POA: Diagnosis present

## 2020-01-26 DIAGNOSIS — J9621 Acute and chronic respiratory failure with hypoxia: Secondary | ICD-10-CM | POA: Diagnosis present

## 2020-01-26 DIAGNOSIS — J189 Pneumonia, unspecified organism: Secondary | ICD-10-CM | POA: Diagnosis not present

## 2020-01-26 DIAGNOSIS — J9601 Acute respiratory failure with hypoxia: Secondary | ICD-10-CM | POA: Diagnosis present

## 2020-01-26 DIAGNOSIS — L97509 Non-pressure chronic ulcer of other part of unspecified foot with unspecified severity: Secondary | ICD-10-CM | POA: Diagnosis present

## 2020-01-26 DIAGNOSIS — R1312 Dysphagia, oropharyngeal phase: Secondary | ICD-10-CM | POA: Diagnosis not present

## 2020-01-26 DIAGNOSIS — Z818 Family history of other mental and behavioral disorders: Secondary | ICD-10-CM

## 2020-01-26 DIAGNOSIS — J811 Chronic pulmonary edema: Secondary | ICD-10-CM | POA: Diagnosis present

## 2020-01-26 DIAGNOSIS — T17590A Other foreign object in bronchus causing asphyxiation, initial encounter: Secondary | ICD-10-CM | POA: Diagnosis not present

## 2020-01-26 DIAGNOSIS — Z9119 Patient's noncompliance with other medical treatment and regimen: Secondary | ICD-10-CM

## 2020-01-26 DIAGNOSIS — Z515 Encounter for palliative care: Secondary | ICD-10-CM | POA: Diagnosis not present

## 2020-01-26 DIAGNOSIS — E1152 Type 2 diabetes mellitus with diabetic peripheral angiopathy with gangrene: Secondary | ICD-10-CM

## 2020-01-26 DIAGNOSIS — L089 Local infection of the skin and subcutaneous tissue, unspecified: Secondary | ICD-10-CM

## 2020-01-26 DIAGNOSIS — R1310 Dysphagia, unspecified: Secondary | ICD-10-CM

## 2020-01-26 DIAGNOSIS — D6959 Other secondary thrombocytopenia: Secondary | ICD-10-CM | POA: Diagnosis present

## 2020-01-26 DIAGNOSIS — Z888 Allergy status to other drugs, medicaments and biological substances status: Secondary | ICD-10-CM

## 2020-01-26 DIAGNOSIS — E8770 Fluid overload, unspecified: Secondary | ICD-10-CM | POA: Diagnosis not present

## 2020-01-26 DIAGNOSIS — Z811 Family history of alcohol abuse and dependence: Secondary | ICD-10-CM

## 2020-01-26 DIAGNOSIS — Z4659 Encounter for fitting and adjustment of other gastrointestinal appliance and device: Secondary | ICD-10-CM

## 2020-01-26 DIAGNOSIS — J9690 Respiratory failure, unspecified, unspecified whether with hypoxia or hypercapnia: Secondary | ICD-10-CM

## 2020-01-26 DIAGNOSIS — J810 Acute pulmonary edema: Secondary | ICD-10-CM | POA: Diagnosis present

## 2020-01-26 DIAGNOSIS — I4891 Unspecified atrial fibrillation: Secondary | ICD-10-CM | POA: Diagnosis not present

## 2020-01-26 DIAGNOSIS — Z23 Encounter for immunization: Secondary | ICD-10-CM | POA: Diagnosis not present

## 2020-01-26 DIAGNOSIS — E785 Hyperlipidemia, unspecified: Secondary | ICD-10-CM | POA: Diagnosis present

## 2020-01-26 DIAGNOSIS — Z6839 Body mass index (BMI) 39.0-39.9, adult: Secondary | ICD-10-CM

## 2020-01-26 DIAGNOSIS — X58XXXA Exposure to other specified factors, initial encounter: Secondary | ICD-10-CM | POA: Diagnosis not present

## 2020-01-26 DIAGNOSIS — R0602 Shortness of breath: Secondary | ICD-10-CM

## 2020-01-26 DIAGNOSIS — E876 Hypokalemia: Secondary | ICD-10-CM | POA: Diagnosis not present

## 2020-01-26 DIAGNOSIS — A419 Sepsis, unspecified organism: Secondary | ICD-10-CM | POA: Diagnosis present

## 2020-01-26 DIAGNOSIS — Z9981 Dependence on supplemental oxygen: Secondary | ICD-10-CM

## 2020-01-26 DIAGNOSIS — B951 Streptococcus, group B, as the cause of diseases classified elsewhere: Secondary | ICD-10-CM | POA: Diagnosis not present

## 2020-01-26 DIAGNOSIS — Z7189 Other specified counseling: Secondary | ICD-10-CM

## 2020-01-26 DIAGNOSIS — J9811 Atelectasis: Secondary | ICD-10-CM | POA: Diagnosis not present

## 2020-01-26 DIAGNOSIS — Z825 Family history of asthma and other chronic lower respiratory diseases: Secondary | ICD-10-CM

## 2020-01-26 DIAGNOSIS — A400 Sepsis due to streptococcus, group A: Secondary | ICD-10-CM

## 2020-01-26 DIAGNOSIS — R233 Spontaneous ecchymoses: Secondary | ICD-10-CM

## 2020-01-26 DIAGNOSIS — Z880 Allergy status to penicillin: Secondary | ICD-10-CM

## 2020-01-26 DIAGNOSIS — I10 Essential (primary) hypertension: Secondary | ICD-10-CM | POA: Diagnosis present

## 2020-01-26 DIAGNOSIS — L02611 Cutaneous abscess of right foot: Secondary | ICD-10-CM | POA: Diagnosis not present

## 2020-01-26 DIAGNOSIS — R6520 Severe sepsis without septic shock: Secondary | ICD-10-CM | POA: Diagnosis present

## 2020-01-26 DIAGNOSIS — F1510 Other stimulant abuse, uncomplicated: Secondary | ICD-10-CM | POA: Diagnosis present

## 2020-01-26 DIAGNOSIS — R0902 Hypoxemia: Secondary | ICD-10-CM

## 2020-01-26 DIAGNOSIS — K567 Ileus, unspecified: Secondary | ICD-10-CM | POA: Diagnosis not present

## 2020-01-26 DIAGNOSIS — T17500A Unspecified foreign body in bronchus causing asphyxiation, initial encounter: Secondary | ICD-10-CM

## 2020-01-26 DIAGNOSIS — F418 Other specified anxiety disorders: Secondary | ICD-10-CM | POA: Diagnosis present

## 2020-01-26 DIAGNOSIS — R627 Adult failure to thrive: Secondary | ICD-10-CM | POA: Diagnosis not present

## 2020-01-26 DIAGNOSIS — R0689 Other abnormalities of breathing: Secondary | ICD-10-CM

## 2020-01-26 DIAGNOSIS — Z7984 Long term (current) use of oral hypoglycemic drugs: Secondary | ICD-10-CM

## 2020-01-26 DIAGNOSIS — E669 Obesity, unspecified: Secondary | ICD-10-CM | POA: Diagnosis not present

## 2020-01-26 DIAGNOSIS — N179 Acute kidney failure, unspecified: Secondary | ICD-10-CM | POA: Diagnosis present

## 2020-01-26 DIAGNOSIS — J90 Pleural effusion, not elsewhere classified: Secondary | ICD-10-CM | POA: Diagnosis not present

## 2020-01-26 DIAGNOSIS — R0600 Dyspnea, unspecified: Secondary | ICD-10-CM | POA: Diagnosis not present

## 2020-01-26 DIAGNOSIS — F17210 Nicotine dependence, cigarettes, uncomplicated: Secondary | ICD-10-CM | POA: Diagnosis present

## 2020-01-26 DIAGNOSIS — M609 Myositis, unspecified: Secondary | ICD-10-CM | POA: Diagnosis present

## 2020-01-26 DIAGNOSIS — F319 Bipolar disorder, unspecified: Secondary | ICD-10-CM | POA: Diagnosis present

## 2020-01-26 DIAGNOSIS — Z79899 Other long term (current) drug therapy: Secondary | ICD-10-CM

## 2020-01-26 DIAGNOSIS — Z0000 Encounter for general adult medical examination without abnormal findings: Secondary | ICD-10-CM

## 2020-01-26 DIAGNOSIS — Z9289 Personal history of other medical treatment: Secondary | ICD-10-CM

## 2020-01-26 DIAGNOSIS — R6521 Severe sepsis with septic shock: Secondary | ICD-10-CM | POA: Diagnosis not present

## 2020-01-26 DIAGNOSIS — Z20822 Contact with and (suspected) exposure to covid-19: Secondary | ICD-10-CM | POA: Diagnosis present

## 2020-01-26 DIAGNOSIS — D72829 Elevated white blood cell count, unspecified: Secondary | ICD-10-CM | POA: Diagnosis present

## 2020-01-26 DIAGNOSIS — J9809 Other diseases of bronchus, not elsewhere classified: Secondary | ICD-10-CM | POA: Diagnosis not present

## 2020-01-26 DIAGNOSIS — J441 Chronic obstructive pulmonary disease with (acute) exacerbation: Secondary | ICD-10-CM | POA: Diagnosis present

## 2020-01-26 DIAGNOSIS — Z66 Do not resuscitate: Secondary | ICD-10-CM | POA: Diagnosis not present

## 2020-01-26 DIAGNOSIS — J942 Hemothorax: Secondary | ICD-10-CM

## 2020-01-26 DIAGNOSIS — Y92230 Patient room in hospital as the place of occurrence of the external cause: Secondary | ICD-10-CM | POA: Diagnosis not present

## 2020-01-26 DIAGNOSIS — E11621 Type 2 diabetes mellitus with foot ulcer: Secondary | ICD-10-CM | POA: Diagnosis present

## 2020-01-26 DIAGNOSIS — R7881 Bacteremia: Secondary | ICD-10-CM | POA: Diagnosis not present

## 2020-01-26 DIAGNOSIS — I517 Cardiomegaly: Secondary | ICD-10-CM | POA: Diagnosis not present

## 2020-01-26 HISTORY — DX: Sepsis, unspecified organism: A41.9

## 2020-01-26 LAB — COMPREHENSIVE METABOLIC PANEL
ALT: 17 U/L (ref 0–44)
AST: 14 U/L — ABNORMAL LOW (ref 15–41)
Albumin: 3.7 g/dL (ref 3.5–5.0)
Alkaline Phosphatase: 96 U/L (ref 38–126)
Anion gap: 12 (ref 5–15)
BUN: 38 mg/dL — ABNORMAL HIGH (ref 8–23)
CO2: 28 mmol/L (ref 22–32)
Calcium: 9 mg/dL (ref 8.9–10.3)
Chloride: 97 mmol/L — ABNORMAL LOW (ref 98–111)
Creatinine, Ser: 1.1 mg/dL — ABNORMAL HIGH (ref 0.44–1.00)
GFR calc Af Amer: >60 mL/min (ref >60)
GFR calc non Af Amer: 53 mL/min — ABNORMAL LOW (ref >60)
Glucose, Bld: 166 mg/dL — ABNORMAL HIGH (ref 70–99)
Potassium: 4.1 mmol/L (ref 3.5–5.1)
Sodium: 137 mmol/L (ref 135–145)
Total Bilirubin: 0.5 mg/dL (ref 0.3–1.2)
Total Protein: 7.8 g/dL (ref 6.5–8.1)

## 2020-01-26 LAB — CBC WITH DIFFERENTIAL/PLATELET
Abs Immature Granulocytes: 0.09 10*3/uL — ABNORMAL HIGH (ref 0.00–0.07)
Basophils Absolute: 0.1 10*3/uL (ref 0.0–0.1)
Basophils Relative: 1 %
Eosinophils Absolute: 0 10*3/uL (ref 0.0–0.5)
Eosinophils Relative: 0 %
HCT: 48.8 % — ABNORMAL HIGH (ref 36.0–46.0)
Hemoglobin: 15.3 g/dL — ABNORMAL HIGH (ref 12.0–15.0)
Immature Granulocytes: 1 %
Lymphocytes Relative: 6 %
Lymphs Abs: 0.8 10*3/uL (ref 0.7–4.0)
MCH: 29 pg (ref 26.0–34.0)
MCHC: 31.4 g/dL (ref 30.0–36.0)
MCV: 92.4 fL (ref 80.0–100.0)
Monocytes Absolute: 0.6 10*3/uL (ref 0.1–1.0)
Monocytes Relative: 5 %
Neutro Abs: 11.3 10*3/uL — ABNORMAL HIGH (ref 1.7–7.7)
Neutrophils Relative %: 87 %
Platelets: 129 10*3/uL — ABNORMAL LOW (ref 150–400)
RBC: 5.28 MIL/uL — ABNORMAL HIGH (ref 3.87–5.11)
RDW: 15 % (ref 11.5–15.5)
WBC: 12.9 10*3/uL — ABNORMAL HIGH (ref 4.0–10.5)
nRBC: 0 % (ref 0.0–0.2)

## 2020-01-26 LAB — HEMOGLOBIN A1C
Hgb A1c MFr Bld: 5.8 % — ABNORMAL HIGH (ref 4.8–5.6)
Mean Plasma Glucose: 119.76 mg/dL

## 2020-01-26 LAB — C-REACTIVE PROTEIN: CRP: 43.8 mg/dL — ABNORMAL HIGH (ref <1.0)

## 2020-01-26 LAB — APTT: aPTT: 29 seconds (ref 24–36)

## 2020-01-26 LAB — HIV ANTIBODY (ROUTINE TESTING W REFLEX): HIV Screen 4th Generation wRfx: NONREACTIVE

## 2020-01-26 LAB — LACTIC ACID, PLASMA
Lactic Acid, Venous: 1.5 mmol/L (ref 0.5–1.9)
Lactic Acid, Venous: 0.8 mmol/L (ref 0.5–1.9)

## 2020-01-26 LAB — PROTIME-INR
INR: 1.1 (ref 0.8–1.2)
Prothrombin Time: 13.6 seconds (ref 11.4–15.2)

## 2020-01-26 LAB — SEDIMENTATION RATE: Sed Rate: 40 mm/hr — ABNORMAL HIGH (ref 0–22)

## 2020-01-26 LAB — GLUCOSE, CAPILLARY: Glucose-Capillary: 175 mg/dL — ABNORMAL HIGH (ref 70–99)

## 2020-01-26 LAB — CBG MONITORING, ED: Glucose-Capillary: 139 mg/dL — ABNORMAL HIGH (ref 70–99)

## 2020-01-26 LAB — SARS CORONAVIRUS 2 BY RT PCR (HOSPITAL ORDER, PERFORMED IN ~~LOC~~ HOSPITAL LAB): SARS Coronavirus 2: NEGATIVE

## 2020-01-26 MED ORDER — LEVALBUTEROL HCL 0.63 MG/3ML IN NEBU
0.6300 mg | INHALATION_SOLUTION | Freq: Four times a day (QID) | RESPIRATORY_TRACT | Status: DC | PRN
  Administered 2020-01-27 – 2020-02-01 (×2): 0.63 mg via RESPIRATORY_TRACT
  Filled 2020-01-26 (×2): qty 3

## 2020-01-26 MED ORDER — VANCOMYCIN HCL IN DEXTROSE 1-5 GM/200ML-% IV SOLN: 1000.0000 mg | Freq: Once | INTRAVENOUS | Status: DC

## 2020-01-26 MED ORDER — BENZTROPINE MESYLATE 1 MG PO TABS
0.5000 mg | ORAL_TABLET | Freq: Two times a day (BID) | ORAL | Status: DC
  Administered 2020-01-26: 0.5 mg via ORAL
  Filled 2020-01-26 (×2): qty 1

## 2020-01-26 MED ORDER — ACETAMINOPHEN 500 MG PO TABS
1000.0000 mg | ORAL_TABLET | Freq: Once | ORAL | Status: AC
  Administered 2020-01-26: 1000 mg via ORAL
  Filled 2020-01-26: qty 2

## 2020-01-26 MED ORDER — OXYCODONE HCL 5 MG PO TABS
5.0000 mg | ORAL_TABLET | ORAL | Status: DC | PRN
  Administered 2020-01-26: 5 mg via ORAL
  Filled 2020-01-26: qty 1

## 2020-01-26 MED ORDER — ONDANSETRON HCL 4 MG PO TABS: 4.0000 mg | ORAL_TABLET | Freq: Four times a day (QID) | ORAL | Status: DC | PRN

## 2020-01-26 MED ORDER — LORAZEPAM 1 MG PO TABS
1.0000 mg | ORAL_TABLET | ORAL | Status: AC | PRN
  Administered 2020-01-26: 1 mg via ORAL
  Filled 2020-01-26: qty 1

## 2020-01-26 MED ORDER — DIVALPROEX SODIUM 250 MG PO DR TAB
500.0000 mg | DELAYED_RELEASE_TABLET | Freq: Two times a day (BID) | ORAL | Status: DC
  Administered 2020-01-26: 500 mg via ORAL
  Filled 2020-01-26 (×2): qty 2

## 2020-01-26 MED ORDER — QUETIAPINE FUMARATE 25 MG PO TABS
50.0000 mg | ORAL_TABLET | Freq: Three times a day (TID) | ORAL | Status: DC
  Administered 2020-01-26 (×2): 50 mg via ORAL
  Filled 2020-01-26 (×3): qty 2

## 2020-01-26 MED ORDER — TETANUS-DIPHTH-ACELL PERTUSSIS 5-2.5-18.5 LF-MCG/0.5 IM SUSP
0.5000 mL | Freq: Once | INTRAMUSCULAR | Status: AC
  Administered 2020-01-26: 0.5 mL via INTRAMUSCULAR
  Filled 2020-01-26: qty 0.5

## 2020-01-26 MED ORDER — INSULIN ASPART 100 UNIT/ML ~~LOC~~ SOLN: 0.0000 [IU] | Freq: Every day | SUBCUTANEOUS | Status: DC

## 2020-01-26 MED ORDER — AMITRIPTYLINE HCL 25 MG PO TABS
100.0000 mg | ORAL_TABLET | Freq: Every day | ORAL | Status: DC
  Administered 2020-01-26: 100 mg via ORAL
  Filled 2020-01-26: qty 4
  Filled 2020-01-26: qty 1

## 2020-01-26 MED ORDER — THIAMINE HCL 100 MG PO TABS
100.0000 mg | ORAL_TABLET | Freq: Every day | ORAL | Status: DC
  Administered 2020-02-02 – 2020-02-11 (×6): 100 mg via ORAL
  Filled 2020-01-26 (×7): qty 1

## 2020-01-26 MED ORDER — FAMOTIDINE 20 MG PO TABS
20.0000 mg | ORAL_TABLET | Freq: Two times a day (BID) | ORAL | Status: DC
  Administered 2020-01-26 (×2): 20 mg via ORAL
  Filled 2020-01-26 (×3): qty 1

## 2020-01-26 MED ORDER — LACTATED RINGERS IV BOLUS (SEPSIS)
1000.0000 mL | Freq: Once | INTRAVENOUS | Status: AC
  Administered 2020-01-26: 1000 mL via INTRAVENOUS

## 2020-01-26 MED ORDER — SODIUM CHLORIDE 0.9 % IV SOLN: INTRAVENOUS | Status: DC

## 2020-01-26 MED ORDER — SERTRALINE HCL 50 MG PO TABS
200.0000 mg | ORAL_TABLET | Freq: Every day | ORAL | Status: DC
  Filled 2020-01-26 (×2): qty 4

## 2020-01-26 MED ORDER — LORAZEPAM 2 MG/ML IJ SOLN
1.0000 mg | INTRAMUSCULAR | Status: AC | PRN
  Administered 2020-01-27: 2 mg via INTRAVENOUS
  Administered 2020-01-27: 4 mg via INTRAVENOUS
  Administered 2020-01-27: 1 mg via INTRAVENOUS
  Administered 2020-01-27: 2 mg via INTRAVENOUS
  Administered 2020-01-27: 4 mg via INTRAVENOUS
  Administered 2020-01-28 (×4): 2 mg via INTRAVENOUS
  Filled 2020-01-26 (×3): qty 1
  Filled 2020-01-26: qty 2
  Filled 2020-01-26: qty 1
  Filled 2020-01-26: qty 2
  Filled 2020-01-26 (×3): qty 1

## 2020-01-26 MED ORDER — FENTANYL CITRATE (PF) 100 MCG/2ML IJ SOLN
50.0000 ug | Freq: Once | INTRAMUSCULAR | Status: AC
  Administered 2020-01-26: 50 ug via INTRAVENOUS
  Filled 2020-01-26: qty 2

## 2020-01-26 MED ORDER — TIOTROPIUM BROMIDE MONOHYDRATE 18 MCG IN CAPS
1.0000 | ORAL_CAPSULE | Freq: Every day | RESPIRATORY_TRACT | Status: DC
  Filled 2020-01-26: qty 5

## 2020-01-26 MED ORDER — NICOTINE 7 MG/24HR TD PT24
7.0000 mg | MEDICATED_PATCH | Freq: Every day | TRANSDERMAL | Status: DC
  Administered 2020-01-27 – 2020-02-11 (×16): 7 mg via TRANSDERMAL
  Filled 2020-01-26 (×19): qty 1

## 2020-01-26 MED ORDER — FOLIC ACID 1 MG PO TABS
1.0000 mg | ORAL_TABLET | Freq: Every day | ORAL | Status: DC
  Filled 2020-01-26: qty 1

## 2020-01-26 MED ORDER — ACETAMINOPHEN 650 MG RE SUPP
650.0000 mg | Freq: Four times a day (QID) | RECTAL | Status: DC | PRN
  Administered 2020-01-27 – 2020-02-01 (×5): 650 mg via RECTAL
  Filled 2020-01-26 (×5): qty 1

## 2020-01-26 MED ORDER — ONDANSETRON HCL 4 MG/2ML IJ SOLN
4.0000 mg | Freq: Four times a day (QID) | INTRAMUSCULAR | Status: DC | PRN
  Administered 2020-02-03 – 2020-02-05 (×3): 4 mg via INTRAVENOUS
  Filled 2020-01-26 (×3): qty 2

## 2020-01-26 MED ORDER — VANCOMYCIN HCL 2000 MG/400ML IV SOLN
2000.0000 mg | Freq: Once | INTRAVENOUS | Status: AC
  Administered 2020-01-26: 2000 mg via INTRAVENOUS
  Filled 2020-01-26: qty 400

## 2020-01-26 MED ORDER — BUSPIRONE HCL 5 MG PO TABS
10.0000 mg | ORAL_TABLET | Freq: Three times a day (TID) | ORAL | Status: DC
  Administered 2020-01-26: 10 mg via ORAL
  Filled 2020-01-26 (×3): qty 2

## 2020-01-26 MED ORDER — THIAMINE HCL 100 MG/ML IJ SOLN
100.0000 mg | Freq: Every day | INTRAMUSCULAR | Status: DC
  Administered 2020-01-28 – 2020-02-10 (×9): 100 mg via INTRAVENOUS
  Filled 2020-01-26 (×10): qty 2

## 2020-01-26 MED ORDER — VANCOMYCIN HCL 1500 MG/300ML IV SOLN: 1500.0000 mg | INTRAVENOUS | Status: DC

## 2020-01-26 MED ORDER — INSULIN ASPART 100 UNIT/ML ~~LOC~~ SOLN
0.0000 [IU] | Freq: Three times a day (TID) | SUBCUTANEOUS | Status: DC
  Administered 2020-01-27 (×2): 1 [IU] via SUBCUTANEOUS
  Administered 2020-01-28 (×2): 2 [IU] via SUBCUTANEOUS
  Administered 2020-01-28: 1 [IU] via SUBCUTANEOUS
  Administered 2020-01-28: 2 [IU] via SUBCUTANEOUS

## 2020-01-26 MED ORDER — ACETAMINOPHEN 325 MG PO TABS
650.0000 mg | ORAL_TABLET | Freq: Four times a day (QID) | ORAL | Status: DC | PRN
  Administered 2020-01-26: 650 mg via ORAL
  Filled 2020-01-26: qty 2

## 2020-01-26 MED ORDER — SODIUM CHLORIDE 0.9 % IV SOLN
2.0000 g | Freq: Once | INTRAVENOUS | Status: AC
  Administered 2020-01-26: 2 g via INTRAVENOUS
  Filled 2020-01-26: qty 20

## 2020-01-26 NOTE — H&P (Addendum)
History and Physical    Quintana Canelo ZOX:[id number] DOB: 03/22/1956 DOA: 01/26/2020  PCP: Leonard Downing, MD   Patient coming from: Home  Chief Complaint: Worsening pain and erythema to right second toe  HPI: [name] [name] is a 64 y.o. female with medical history significant for COPD on 2L [place] at bedtime, hypertension, type 2 diabetes, obesity, bipolar disorder/anxiety disorder, ongoing tobacco use, questionable ongoing alcohol abuse, and prior polysubstance abuse with methamphetamine and alcohol who presented to the ED with progressive worsening of pain, erythema, and serosanguineous drainage from her right second toe.  This began to gradually develop after her daughter trimmed her nail too close to her skin approximately 2 weeks ago.  She has noticed some fever, chills, body aches, and mild headache as well as general malaise at home.  She tried soaking her foot at home with no improvement in her symptoms.  She denies any chest pain, shortness of breath, abdominal pain, nausea, vomiting, or dysuria.  She denies any upper respiratory symptoms as well.  She has not been vaccinated for COVID-19.   ED Course: Vital signs demonstrate ongoing sinus tachycardia and this is confirmed on EKG with heart rate approximately 110 bpm.  Temperature 1-1.5 Fahrenheit.  She is noted to have a leukocytosis of 12,900 as well as a thrombocytopenia of 129,000.  BUN is 38 and creatinine is elevated at 1.1 with baseline 0.3-0.5.  Chest x-ray with mild cardiomegaly and no other acute findings.  Covid testing negative.  Right foot x-ray with no soft tissue swelling or signs of osteomyelitis noted.  Lactic acid is 1.5.  Patient has been ordered and started 3 L fluid bolus as well as vancomycin and Rocephin for suspected sepsis secondary to cellulitis.  Review of Systems: All others reviewed and otherwise negative.  Past Medical History:  Diagnosis Date  . Anxiety   . Bipolar 1 disorder ([place])   . Cancer  ([place])    colon  . Chronic diarrhea    Followed by Dr Michail Sermon   . Complication of anesthesia    Hx resp distress during surgery  . COPD (chronic obstructive pulmonary disease) ([place])   . Depression   . Diabetes mellitus ([place])   . External hemorrhoids   . GI bleed   . Headache(784.0)   . Hyperlipidemia   . Hypertension   . Hypothyroidism   . Insomnia   . Internal hemorrhoids   . Polysubstance abuse ([place]) Quit 2008    H/O methamphetamine and alcohol abuse, clean x 6 years  . Shortness of breath   . Suicidal thoughts   . Tobacco abuse   . Tubular adenoma of colon     Past Surgical History:  Procedure Laterality Date  . ABDOMINAL HYSTERECTOMY    . APPENDECTOMY    . COLON SURGERY     "colon repair"  . COLONOSCOPY WITH PROPOFOL N/A 12/11/2012   Procedure: COLONOSCOPY WITH PROPOFOL;  Surgeon: Jerene Bears, MD;  Location: [facility];  Service: Gastroenterology;  Laterality: N/A;  . FINGER SURGERY     middle left finger  . HERNIA REPAIR     x 2  . KNEE SURGERY Left    Left knee tendon surgery  . TONSILLECTOMY    . TUBAL LIGATION       reports that she has been smoking cigarettes. She has a 42.00 pack-year smoking history. She has never used smokeless tobacco. She reports current alcohol use. She reports that she does not use drugs.  Allergies  Allergen  Reactions  . Bactrim [Sulfamethoxazole-Trimethoprim] Anaphylaxis  . Amoxicillin Hives  . Diclofenac Tinitus    swelling in feet  . Morphine And Related Nausea And Vomiting and Other (See Comments)    headache  . Potassium-Containing Compounds Itching    Family History  Problem Relation Age of Onset  . Multiple sclerosis Mother   . Alcoholism Father   . Depression Brother   . Emphysema Maternal Grandfather        smoked  . Breast cancer Neg Hx   . Colon cancer Neg Hx   . Esophageal cancer Neg Hx   . Colon polyps Neg Hx   . Stomach cancer Neg Hx   . Pancreatic cancer Neg Hx   . Liver disease Neg Hx     Prior  to Admission medications   Medication Sig Start Date End Date Taking? Authorizing Provider  albuterol (PROVENTIL) (5 MG/ML) 0.5% nebulizer solution Take 2.5 mg by nebulization every 6 (six) hours as needed for wheezing or shortness of breath. 04/16/12   Bonnielee Haff, MD  amitriptyline (ELAVIL) 100 MG tablet Take 100 mg by mouth at bedtime.     [provider]  amLODipine (NORVASC) 5 MG tablet Take 5 mg by mouth daily.    [provider]  benztropine (COGENTIN) 0.5 MG tablet Take 0.5 mg by mouth 2 (two) times daily. 12/11/19   [provider]  busPIRone (BUSPAR) 10 MG tablet Take 10 mg by mouth 3 (three) times daily.     [provider]  divalproex (DEPAKOTE) 500 MG DR tablet Take 500 mg by mouth 2 (two) times daily.     [provider]  famotidine (PEPCID) 20 MG tablet Take 1 tablet (20 mg total) by mouth 2 (two) times daily. 01/10/20   Milton Ferguson, MD  metFORMIN (GLUCOPHAGE) 850 MG tablet Take 1 tablet by mouth in the morning and at bedtime. 04/27/17   [provider]  QUEtiapine (SEROQUEL) 50 MG tablet Take 50 mg by mouth 3 (three) times daily.     [provider]  sertraline (ZOLOFT) 100 MG tablet Take 200 mg by mouth daily.     [provider]  SPIRIVA HANDIHALER 18 MCG inhalation capsule Place 1 capsule into inhaler and inhale daily.  12/17/19   [provider]    Physical Exam: Vitals:   01/26/20 1301 01/26/20 1303 01/26/20 1415 01/26/20 1430  BP: 121/82   121/68  Pulse: (!) 121  (!) 110 (!) 112  Resp: (!) 24  17 (!) 22  Temp: (!) 101.5 F (38.6 C)     TempSrc: Oral     SpO2: 90%  99% 99%  Weight:  99.8 kg    Height:  '5\' 5"'  (1.651 m)      Constitutional: NAD, calm, comfortable, obese Vitals:   01/26/20 1301 01/26/20 1303 01/26/20 1415 01/26/20 1430  BP: 121/82   121/68  Pulse: (!) 121  (!) 110 (!) 112  Resp: (!) 24  17 (!) 22  Temp: (!) 101.5 F (38.6 C)     TempSrc: Oral     SpO2: 90%  99%  99%  Weight:  99.8 kg    Height:  '5\' 5"'  (1.651 m)     Eyes: lids and conjunctivae normal ENMT: Mucous membranes are moist.  Neck: normal, supple Respiratory: clear to auscultation bilaterally. Normal respiratory effort. No accessory muscle use. Currently on 5L [place]. Cardiovascular: Regular rate and rhythm, no murmurs. No extremity edema. Abdomen: no tenderness, no distention. Bowel  sounds positive.  Musculoskeletal:  No joint deformity upper and lower extremities.   Skin:       Psychiatric: Normal judgment and insight. Alert and oriented x 3. Normal mood.   Labs on Admission: I have personally reviewed following labs and imaging studies  CBC: Recent Labs  Lab 01/26/20 1350  WBC 12.9*  NEUTROABS 11.3*  HGB 15.3*  HCT 48.8*  MCV 92.4  PLT 010*   Basic Metabolic Panel: Recent Labs  Lab 01/26/20 1350  NA 137  K 4.1  CL 97*  CO2 28  GLUCOSE 166*  BUN 38*  CREATININE 1.10*  CALCIUM 9.0   GFR: Estimated Creatinine Clearance: 61.2 mL/min (A) (by C-G formula based on SCr of 1.1 mg/dL (H)). Liver Function Tests: Recent Labs  Lab 01/26/20 1350  AST 14*  ALT 17  ALKPHOS 96  BILITOT 0.5  PROT 7.8  ALBUMIN 3.7   No results for input(s): LIPASE, AMYLASE in the last 168 hours. No results for input(s): AMMONIA in the last 168 hours. Coagulation Profile: Recent Labs  Lab 01/26/20 1350  INR 1.1   Cardiac Enzymes: No results for input(s): CKTOTAL, CKMB, CKMBINDEX, TROPONINI in the last 168 hours. BNP (last 3 results) No results for input(s): PROBNP in the last 8760 hours. HbA1C: No results for input(s): HGBA1C in the last 72 hours. CBG: No results for input(s): GLUCAP in the last 168 hours. Lipid Profile: No results for input(s): CHOL, HDL, LDLCALC, TRIG, CHOLHDL, LDLDIRECT in the last 72 hours. Thyroid Function Tests: No results for input(s): TSH, T4TOTAL, FREET4, T3FREE, THYROIDAB in the last 72 hours. Anemia Panel: No results for input(s): VITAMINB12,  FOLATE, FERRITIN, TIBC, IRON, RETICCTPCT in the last 72 hours. Urine analysis:    Component Value Date/Time   COLORURINE YELLOW 05/27/2018 [place] 05/27/2018 1716   LABSPEC 1.011 05/27/2018 1716   PHURINE 7.0 05/27/2018 1716   GLUCOSEU NEGATIVE 05/27/2018 [place] 05/27/2018 [place] 05/27/2018 1716   KETONESUR 20 (A) 05/27/2018 1716   PROTEINUR NEGATIVE 05/27/2018 1716   UROBILINOGEN 1.0 10/31/2012 1157   NITRITE NEGATIVE 05/27/2018 1716   LEUKOCYTESUR TRACE (A) 05/27/2018 1716    Radiological Exams on Admission: DG Chest Port 1 View  Result Date: 01/26/2020 CLINICAL DATA:  Possible sepsis.  Right second toe and leg pain. EXAM: PORTABLE CHEST 1 VIEW COMPARISON:  01/10/2020 FINDINGS: Cardiac silhouette is mildly enlarged. No mediastinal or hilar masses. No evidence of adenopathy. Lungs are hyperexpanded. Prominent bilateral interstitial markings are stable from the prior exam. No lung consolidation or evidence of edema. No pleural effusion or pneumothorax. Old healed left mid rib fracture. Skeletal structures are demineralized. IMPRESSION: 1. No acute cardiopulmonary disease. 2. Mild cardiomegaly. Electronically Signed   By: Lajean Manes M.D.   On: 01/26/2020 14:40   DG Foot Complete Right  Result Date: 01/26/2020 CLINICAL DATA:  Pain in the right second toe radiating to the right leg. Right second toe is discolored. Patient states swelling and discoloration began after having her toenails clipped. EXAM: RIGHT FOOT COMPLETE - 3+ VIEW COMPARISON:  None. FINDINGS: No fracture or bone lesion. There is no bone resorption to suggest osteomyelitis. Joints are normally aligned. There is second toe soft tissue swelling.  No soft tissue air. IMPRESSION: 1. No fracture, dislocation or evidence of osteomyelitis. Electronically Signed   By: Lajean Manes M.D.   On: 01/26/2020 14:42    EKG: Independently reviewed. ST 110bpm. Mild  LVH.  Assessment/Plan  Active Problems:   Severe sepsis (HCC)    Severe sepsis secondary to right lower extremity cellulitis and possibly left great toe -No signs of osteomyelitis or soft tissue swelling noted on foot x-ray -Continue ongoing treatment with IV vancomycin -Blood cultures ordered and pending -Monitor ESR and CRP -Monitor repeat labs along with lactic acid  AKI secondary to above Baseline creatinine 0.3-0.4 and currently at 1.1 -Continue aggressive IV fluid -Should resolve with resolution of sepsis physiology -Avoid nephrotoxic agents -Strict I's and O's -Repeat labs in a.m. and consider further evaluation as needed  Mild thrombocytopenia likely related to above -Maintain on SCDs for now monitor repeat CBC  Alcohol withdrawal symptoms -CIWA protocol for coverage -Prior history of significant abuse, patient claims she hasn't had a drink in 6-7 months  History of type 2 diabetes with hyperglycemia -Claims to not have diabetes any longer -Does not take her home metformin -Carb modified diet with SSI -Will check A1c  History of COPD with chronic hypoxemia -Wears 2L at bedtime even though she should be wearing all day -Continues to smoke -Continue Spiriva -Breathing treatments with albuterol neb as needed -Covid testing negative  History of anxiety/bipolar disorder -Continue home medications  History of tobacco abuse -Counseled on cessation -Nicotine patch   History of hypertension -Hold home amlodipine for now given sepsis physiology  History of obesity -Lifestyle changes in outpatient setting -Continue Metformin outpatient to assist with insulin resistance   DVT prophylaxis: SCDs Code Status: Full Family Communication: None at bedside; pt lives on her own and states she will call her daughter Disposition Plan:Admit for treatment of sepsis/cellulitis Consults called:None Admission status: Inpatient, Tele   Najla Aughenbaugh D Manuella Ghazi DO [facility]  If 7PM-7AM, please contact night-coverage [email or web address]  01/26/2020, 3:09 PM

## 2020-01-26 NOTE — Progress Notes (Signed)
  Pharmacy Antibiotic Note  [name] [name] is a 64 y.o. female admitted on 01/26/2020 with cellulitis.  Pharmacy has been consulted for Vanco dosing.  Plan: Vancomycin 2000 mg IV x 1 dose. Vancomycin 1500 mg IV every 24 hours. Expected AUC 442. Monitor labs, c/s, and vanco level as indicated.  Height: 5\' 5"  (165.1 cm) Weight: 99.8 kg (220 lb) IBW/kg (Calculated) : 57  Temp (24hrs), Avg:101.5 F (38.6 C), Min:101.5 F (38.6 C), Max:101.5 F (38.6 C)  Recent Labs  Lab 01/26/20 1350  WBC 12.9*    Estimated Creatinine Clearance: 84.2 mL/min (by C-G formula based on SCr of 0.44 mg/dL).    Allergies  Allergen Reactions  . Bactrim [Sulfamethoxazole-Trimethoprim] Anaphylaxis  . Amoxicillin Hives  . Diclofenac Tinitus    swelling in feet  . Morphine And Related Nausea And Vomiting and Other (See Comments)    headache  . Potassium-Containing Compounds Itching    Antimicrobials this admission: Vanco 9/5 >>  CTX 9/5 x 1   Microbiology results: 9/5 BCx: pending 9/5 UCx: pending    Thank you for allowing pharmacy to be a part of this patient's care.  Ramond Craver 01/26/2020 2:29 PM

## 2020-01-26 NOTE — ED Triage Notes (Signed)
Pt to er, pt states that her daughter cut her toe nails about a week ago, pt has swelling and redness to her R pointer toe, pt has swelling and purplish color to toe, pt also has redenss in her foot going up her leg.

## 2020-01-26 NOTE — ED Provider Notes (Signed)
[facility] EMERGENCY DEPARTMENT Provider Note   CSN: [id number] Arrival date & time: 01/26/20  1244     History Chief Complaint  Patient presents with  . Toe Pain  . Code Sepsis    [name] [name] is a 64 y.o. female.  The history is provided by the patient and medical records. No language interpreter was used.     64 year old female significant history of hypertension, COPD, diabetes, polysubstance abuse, bipolar, brought here via family member for evaluation of toe infection.  Patient report 2 weeks ago her daughter trim her nail and apparently may have trimmed a bit too close to her skin.  Since then she developed progressive worsening pain redness and drainage from her right second toe.  Pain is described as sharp throbbing and now radiates up towards her leg towards her groin.  Pain is 7 out of 10.  She endorsed fever, chills, body aches, mild headache and overall not feeling well.  She tried soaking without adequate relief.  She does not complain of any runny nose sneezing or coughing no chest pain or shortness of breath no abdominal pain or dysuria.  She has not been vaccinated for COVID-19.  She is unsure her last tetanus status.  Past Medical History:  Diagnosis Date  . Anxiety   . Bipolar 1 disorder ([place])   . Cancer ([place])    colon  . Chronic diarrhea    Followed by Dr Michail Sermon   . Complication of anesthesia    Hx resp distress during surgery  . COPD (chronic obstructive pulmonary disease) ([place])   . Depression   . Diabetes mellitus ([place])   . External hemorrhoids   . GI bleed   . Headache(784.0)   . Hyperlipidemia   . Hypertension   . Hypothyroidism   . Insomnia   . Internal hemorrhoids   . Polysubstance abuse ([place]) Quit 2008    H/O methamphetamine and alcohol abuse, clean x 6 years  . Shortness of breath   . Suicidal thoughts   . Tobacco abuse   . Tubular adenoma of colon     Patient Active Problem List   Diagnosis Date Noted  . COPD still smoking/  pft's pending  12/27/2012  . Chronic respiratory failure ([place]) 12/27/2012  . Personal history of colonic polyps 12/11/2012  . Chronic diarrhea 12/11/2012  . Benign neoplasm of colon 12/11/2012  . Migraine 12/04/2012  . Alcohol abuse 12/04/2012  . Gait instability 08/29/2012  . Tremor 08/29/2012  . Hyperlipidemia 07/08/2012  . Acute respiratory failure with hypoxia ([place]) 07/08/2012  . COPD exacerbation ([place]) 04/13/2012  . Acute-on-chronic respiratory failure ([place]) 04/13/2012  . Low back pain 04/13/2012  . Thrombocytopenia ([place]) 04/13/2012  . Non-insulin dependent type 2 diabetes mellitus ([place]) 04/13/2012  . Tobacco abuse 04/13/2012  . Hypertension   . Bipolar 1 disorder ([place])   . Insomnia   . Depression   . Suicidal thoughts     Past Surgical History:  Procedure Laterality Date  . ABDOMINAL HYSTERECTOMY    . APPENDECTOMY    . COLON SURGERY     "colon repair"  . COLONOSCOPY WITH PROPOFOL N/A 12/11/2012   Procedure: COLONOSCOPY WITH PROPOFOL;  Surgeon: Jerene Bears, MD;  Location: [facility] ENDOSCOPY;  Service: Gastroenterology;  Laterality: N/A;  . FINGER SURGERY     middle left finger  . HERNIA REPAIR     x 2  . KNEE SURGERY Left    Left knee tendon surgery  . TONSILLECTOMY    .  TUBAL LIGATION       OB History   No obstetric history on file.     Family History  Problem Relation Age of Onset  . Multiple sclerosis Mother   . Alcoholism Father   . Depression Brother   . Emphysema Maternal Grandfather        smoked  . Breast cancer Neg Hx   . Colon cancer Neg Hx   . Esophageal cancer Neg Hx   . Colon polyps Neg Hx   . Stomach cancer Neg Hx   . Pancreatic cancer Neg Hx   . Liver disease Neg Hx     Social History   Tobacco Use  . Smoking status: Current Every Day Smoker    Packs/day: 1.00    Years: 42.00    Pack years: 42.00    Types: Cigarettes  . Smokeless tobacco: Never Used  Substance Use Topics  . Alcohol use: Yes    Comment: occ  . Drug use: No     Home Medications Prior to Admission medications   Medication Sig Start Date End Date Taking? Authorizing Provider  albuterol (PROVENTIL) (5 MG/ML) 0.5% nebulizer solution Take 2.5 mg by nebulization every 6 (six) hours as needed for wheezing or shortness of breath. 04/16/12   Bonnielee Haff, MD  amitriptyline (ELAVIL) 100 MG tablet Take 100 mg by mouth at bedtime.     [provider]  amLODipine (NORVASC) 5 MG tablet Take 5 mg by mouth daily.    [provider]  benztropine (COGENTIN) 0.5 MG tablet Take 0.5 mg by mouth 2 (two) times daily. 12/11/19   [provider]  busPIRone (BUSPAR) 10 MG tablet Take 10 mg by mouth 3 (three) times daily.     [provider]  divalproex (DEPAKOTE) 500 MG DR tablet Take 500 mg by mouth 2 (two) times daily.     [provider]  famotidine (PEPCID) 20 MG tablet Take 1 tablet (20 mg total) by mouth 2 (two) times daily. 01/10/20   Milton Ferguson, MD  metFORMIN (GLUCOPHAGE) 850 MG tablet Take 1 tablet by mouth in the morning and at bedtime. 04/27/17   [provider]  QUEtiapine (SEROQUEL) 50 MG tablet Take 50 mg by mouth 3 (three) times daily.     [provider]  sertraline (ZOLOFT) 100 MG tablet Take 200 mg by mouth daily.     [provider]  SPIRIVA HANDIHALER 18 MCG inhalation capsule Place 1 capsule into inhaler and inhale daily.  12/17/19   [provider]    Allergies    Bactrim [sulfamethoxazole-trimethoprim], Amoxicillin, Diclofenac, Morphine and related, and Potassium-containing compounds  Review of Systems   Review of Systems  All other systems reviewed and are negative.   Physical Exam Updated Vital Signs BP 121/82 (BP Location: Right Arm)   Pulse (!) 121   Temp (!) 101.5 F (38.6 C) (Oral)   Resp (!) 24   Ht 5\' 5"  (1.651 m)   Wt 99.8 kg   SpO2 90%   BMI 36.61 kg/m   Physical Exam Vitals and nursing note reviewed.  Constitutional:      General: She is  not in acute distress.    Appearance: She is well-developed.  HENT:     Head: Atraumatic.  Eyes:     Conjunctiva/sclera: Conjunctivae normal.  Cardiovascular:     Rate and Rhythm: Tachycardia present.     Pulses: Normal pulses.     Heart sounds: Normal heart sounds.  Pulmonary:  Breath sounds: Normal breath sounds.     Comments: Mild tachypnea Abdominal:     Palpations: Abdomen is soft.     Tenderness: There is no abdominal tenderness.  Musculoskeletal:     Cervical back: Neck supple. No rigidity.  Skin:    Findings: Erythema (Right lower extremity: Second toe is dusky, purplish in color, oozing serous fluid, edematous and tender to palpation.  Lymphangitis skin changes extending towards right lower leg.  DP pulse 2+.) present. No rash.     Comments: Left foot: Great toe is erythematous with scab noted in the lateral aspect.  Neurological:     Mental Status: She is alert and oriented to person, place, and time.  Psychiatric:        Mood and Affect: Mood normal.     ED Results / Procedures / Treatments   Labs (all labs ordered are listed, but only abnormal results are displayed) Labs Reviewed  COMPREHENSIVE METABOLIC PANEL - Abnormal; Notable for the following components:      Result Value   Chloride 97 (*)    Glucose, Bld 166 (*)    BUN 38 (*)    Creatinine, Ser 1.10 (*)    AST 14 (*)    GFR calc non Af Amer 53 (*)    All other components within normal limits  CBC WITH DIFFERENTIAL/PLATELET - Abnormal; Notable for the following components:   WBC 12.9 (*)    RBC 5.28 (*)    Hemoglobin 15.3 (*)    HCT 48.8 (*)    Platelets 129 (*)    Neutro Abs 11.3 (*)    Abs Immature Granulocytes 0.09 (*)    All other components within normal limits  SARS CORONAVIRUS 2 BY RT PCR (HOSPITAL ORDER, [facility])  CULTURE, BLOOD (ROUTINE X 2)  CULTURE, BLOOD (ROUTINE X 2)  URINE CULTURE  LACTIC ACID, PLASMA  PROTIME-INR  APTT  LACTIC ACID, PLASMA   URINALYSIS, ROUTINE W REFLEX MICROSCOPIC    EKG None  ED ECG REPORT   Date: 01/26/2020  Rate: 110  Rhythm: sinus tachycardia  QRS Axis: normal  Intervals: normal  ST/T Wave abnormalities: normal  Conduction Disutrbances:none  Narrative Interpretation:   Old EKG Reviewed: unchanged  I have personally reviewed the EKG tracing and agree with the computerized printout as noted.   Radiology DG Chest Port 1 View  Result Date: 01/26/2020 CLINICAL DATA:  Possible sepsis.  Right second toe and leg pain. EXAM: PORTABLE CHEST 1 VIEW COMPARISON:  01/10/2020 FINDINGS: Cardiac silhouette is mildly enlarged. No mediastinal or hilar masses. No evidence of adenopathy. Lungs are hyperexpanded. Prominent bilateral interstitial markings are stable from the prior exam. No lung consolidation or evidence of edema. No pleural effusion or pneumothorax. Old healed left mid rib fracture. Skeletal structures are demineralized. IMPRESSION: 1. No acute cardiopulmonary disease. 2. Mild cardiomegaly. Electronically Signed   By: Lajean Manes M.D.   On: 01/26/2020 14:40   DG Foot Complete Right  Result Date: 01/26/2020 CLINICAL DATA:  Pain in the right second toe radiating to the right leg. Right second toe is discolored. Patient states swelling and discoloration began after having her toenails clipped. EXAM: RIGHT FOOT COMPLETE - 3+ VIEW COMPARISON:  None. FINDINGS: No fracture or bone lesion. There is no bone resorption to suggest osteomyelitis. Joints are normally aligned. There is second toe soft tissue swelling.  No soft tissue air. IMPRESSION: 1. No fracture, dislocation or evidence of osteomyelitis. Electronically Signed   By:  Lajean Manes M.D.   On: 01/26/2020 14:42    Procedures .Critical Care Performed by: Domenic Moras, PA-C Authorized by: Domenic Moras, PA-C   Critical care provider statement:    Critical care time (minutes):  32   Critical care was time spent personally by me on the following  activities:  Discussions with consultants, evaluation of patient's response to treatment, examination of patient, ordering and performing treatments and interventions, ordering and review of laboratory studies, ordering and review of radiographic studies, pulse oximetry, re-evaluation of patient's condition, obtaining history from patient or surrogate and review of old charts   (including critical care time)  Medications Ordered in ED Medications  0.9 %  sodium chloride infusion ( Intravenous New Bag/Given 01/26/20 1441)  lactated ringers bolus 1,000 mL (1,000 mLs Intravenous New Bag/Given 01/26/20 1426)    And  lactated ringers bolus 1,000 mL (has no administration in time range)    And  lactated ringers bolus 1,000 mL (has no administration in time range)  vancomycin (VANCOREADY) IVPB 2000 mg/400 mL (2,000 mg Intravenous New Bag/Given 01/26/20 1436)  vancomycin (VANCOREADY) IVPB 1500 mg/300 mL (has no administration in time range)  cefTRIAXone (ROCEPHIN) 2 g in sodium chloride 0.9 % 100 mL IVPB (2 g Intravenous New Bag/Given 01/26/20 1430)  fentaNYL (SUBLIMAZE) injection 50 mcg (50 mcg Intravenous Given 01/26/20 1434)  acetaminophen (TYLENOL) tablet 1,000 mg (1,000 mg Oral Given 01/26/20 1426)  Tdap (BOOSTRIX) injection 0.5 mL (0.5 mLs Intramuscular Given 01/26/20 1425)    ED Course  I have reviewed the triage vital signs and the nursing notes.  Pertinent labs & imaging results that were available during my care of the patient were reviewed by me and considered in my medical decision making (see chart for details).    MDM Rules/Calculators/A&P                          BP 121/68   Pulse (!) 112   Temp (!) 101.5 F (38.6 C) (Oral)   Resp (!) 22   Ht 5\' 5"  (1.651 m)   Wt 99.8 kg   SpO2 99%   BMI 36.61 kg/m   Final Clinical Impression(s) / ED Diagnoses Final diagnoses:  Sepsis, due to unspecified organism, unspecified whether acute organ dysfunction present ([place])  Toe infection    Cellulitis of right lower leg    Rx / DC Orders ED Discharge Orders    None     2:16 PM Patient with history of diabetes here with injury to her right second toe after her daughter trim her nails too closely to the skin.  Toe subsequently became severely infected and it appears there lymphangitis and cellulitis skin changes proximal to the infected toe extending towards her right knee.  She is septic with a temperature of 101.5, tachycardic with heart rate of 121, tachypneic with a respiratory rate of 24, as well as hypertensive with systolic blood pressure of 88.  Code sepsis initiated, will initiate antibiotic, will give fluids resuscitation, check COVID-19 screening test and will obtain x-ray of her foot to assess for osteomyelitis.  Was giving Tylenol for her fever.  Will give fentanyl for pain.  3:08 PM COVID-19 test is negative.  Normal lactic acid.  Mild AKI with BUN 38, creatinine 1.1.  White count is 12.9. X-ray which is unremarkable.  X-ray of the right foot without any evidence of osteomyelitis.  Patient was given vancomycin and Rocephin as well as IV fluid  resuscitation, blood pressure did improve.  Appreciate consultation from [facility] hospitalist, Dr. Manuella Ghazi, who agrees to see and admit patient for further care.  [name] [name] was evaluated in Emergency Department on 01/26/2020 for the symptoms described in the history of present illness. She was evaluated in the context of the global COVID-19 pandemic, which necessitated consideration that the patient might be at risk for infection with the SARS-CoV-2 virus that causes COVID-19. Institutional protocols and algorithms that pertain to the evaluation of patients at risk for COVID-19 are in a state of rapid change based on information released by regulatory bodies including the CDC and federal and state organizations. These policies and algorithms were followed during the patient's care in the ED.    Domenic Moras, PA-C 01/26/20 1511     Milton Ferguson, MD 01/28/20 [phone]

## 2020-01-27 ENCOUNTER — Inpatient Hospital Stay (HOSPITAL_COMMUNITY): Payer: Medicaid Other

## 2020-01-27 DIAGNOSIS — Z515 Encounter for palliative care: Secondary | ICD-10-CM | POA: Diagnosis not present

## 2020-01-27 DIAGNOSIS — E669 Obesity, unspecified: Secondary | ICD-10-CM

## 2020-01-27 DIAGNOSIS — Z66 Do not resuscitate: Secondary | ICD-10-CM | POA: Diagnosis not present

## 2020-01-27 DIAGNOSIS — Z20822 Contact with and (suspected) exposure to covid-19: Secondary | ICD-10-CM | POA: Diagnosis not present

## 2020-01-27 DIAGNOSIS — J9621 Acute and chronic respiratory failure with hypoxia: Secondary | ICD-10-CM

## 2020-01-27 DIAGNOSIS — A401 Sepsis due to streptococcus, group B: Secondary | ICD-10-CM | POA: Diagnosis not present

## 2020-01-27 DIAGNOSIS — J9622 Acute and chronic respiratory failure with hypercapnia: Secondary | ICD-10-CM

## 2020-01-27 DIAGNOSIS — N179 Acute kidney failure, unspecified: Secondary | ICD-10-CM

## 2020-01-27 LAB — COMPREHENSIVE METABOLIC PANEL
ALT: 14 U/L (ref 0–44)
AST: 12 U/L — ABNORMAL LOW (ref 15–41)
Albumin: 2.9 g/dL — ABNORMAL LOW (ref 3.5–5.0)
Alkaline Phosphatase: 86 U/L (ref 38–126)
Anion gap: 10 (ref 5–15)
BUN: 40 mg/dL — ABNORMAL HIGH (ref 8–23)
CO2: 27 mmol/L (ref 22–32)
Calcium: 8.2 mg/dL — ABNORMAL LOW (ref 8.9–10.3)
Chloride: 103 mmol/L (ref 98–111)
Creatinine, Ser: 0.75 mg/dL (ref 0.44–1.00)
GFR calc Af Amer: >60 mL/min (ref >60)
GFR calc non Af Amer: >60 mL/min (ref >60)
Glucose, Bld: 132 mg/dL — ABNORMAL HIGH (ref 70–99)
Potassium: 4.3 mmol/L (ref 3.5–5.1)
Sodium: 140 mmol/L (ref 135–145)
Total Bilirubin: 0.2 mg/dL — ABNORMAL LOW (ref 0.3–1.2)
Total Protein: 6.7 g/dL (ref 6.5–8.1)

## 2020-01-27 LAB — PROCALCITONIN: Procalcitonin: 3.34 ng/mL

## 2020-01-27 LAB — BLOOD CULTURE ID PANEL (REFLEXED) - BCID2

## 2020-01-27 LAB — CBC
HCT: 46.5 % — ABNORMAL HIGH (ref 36.0–46.0)
Hemoglobin: 13.7 g/dL (ref 12.0–15.0)
MCH: 28.4 pg (ref 26.0–34.0)
MCHC: 29.5 g/dL — ABNORMAL LOW (ref 30.0–36.0)
MCV: 96.3 fL (ref 80.0–100.0)
Platelets: 97 10*3/uL — ABNORMAL LOW (ref 150–400)
RBC: 4.83 MIL/uL (ref 3.87–5.11)
RDW: 15.3 % (ref 11.5–15.5)
WBC: 10.2 10*3/uL (ref 4.0–10.5)
nRBC: 0 % (ref 0.0–0.2)

## 2020-01-27 LAB — BLOOD GAS, ARTERIAL
Acid-Base Excess: 2.7 mmol/L — ABNORMAL HIGH (ref 0.0–2.0)
Bicarbonate: 25.2 mmol/L (ref 20.0–28.0)
FIO2: 44
O2 Saturation: 91.2 %
Patient temperature: 39.4
pCO2 arterial: 70.5 mmHg (ref 32.0–48.0)
pH, Arterial: 7.251 — ABNORMAL LOW (ref 7.350–7.450)
pO2, Arterial: 72.4 mmHg — ABNORMAL LOW (ref 83.0–108.0)

## 2020-01-27 LAB — GLUCOSE, CAPILLARY
Glucose-Capillary: 135 mg/dL — ABNORMAL HIGH (ref 70–99)
Glucose-Capillary: 147 mg/dL — ABNORMAL HIGH (ref 70–99)

## 2020-01-27 LAB — SEDIMENTATION RATE: Sed Rate: 52 mm/hr — ABNORMAL HIGH (ref 0–22)

## 2020-01-27 LAB — C-REACTIVE PROTEIN: CRP: 44 mg/dL — ABNORMAL HIGH (ref <1.0)

## 2020-01-27 LAB — PROTIME-INR
INR: 1.2 (ref 0.8–1.2)
Prothrombin Time: 14.6 seconds (ref 11.4–15.2)

## 2020-01-27 LAB — MAGNESIUM: Magnesium: 1.9 mg/dL (ref 1.7–2.4)

## 2020-01-27 LAB — CORTISOL-AM, BLOOD: Cortisol - AM: 29.6 ug/dL — ABNORMAL HIGH (ref 6.7–22.6)

## 2020-01-27 LAB — LACTIC ACID, PLASMA: Lactic Acid, Venous: 0.8 mmol/L (ref 0.5–1.9)

## 2020-01-27 MED ORDER — CEFAZOLIN SODIUM-DEXTROSE 2-4 GM/100ML-% IV SOLN
2.0000 g | Freq: Three times a day (TID) | INTRAVENOUS | Status: DC
  Administered 2020-01-27 – 2020-02-01 (×16): 2 g via INTRAVENOUS
  Filled 2020-01-27 (×15): qty 100

## 2020-01-27 MED ORDER — FUROSEMIDE 10 MG/ML IJ SOLN
40.0000 mg | Freq: Once | INTRAMUSCULAR | Status: AC
  Administered 2020-01-27: 40 mg via INTRAVENOUS
  Filled 2020-01-27: qty 4

## 2020-01-27 MED ORDER — OXYCODONE HCL 5 MG PO TABS: 5.0000 mg | ORAL_TABLET | Freq: Four times a day (QID) | ORAL | Status: DC | PRN

## 2020-01-27 MED ORDER — BUDESONIDE 0.5 MG/2ML IN SUSP
0.5000 mg | Freq: Two times a day (BID) | RESPIRATORY_TRACT | Status: DC
  Administered 2020-01-27 – 2020-02-14 (×36): 0.5 mg via RESPIRATORY_TRACT
  Filled 2020-01-27 (×38): qty 2

## 2020-01-27 MED ORDER — CHLORHEXIDINE GLUCONATE CLOTH 2 % EX PADS
6.0000 | MEDICATED_PAD | Freq: Every day | CUTANEOUS | Status: DC
  Administered 2020-01-27 – 2020-02-10 (×14): 6 via TOPICAL

## 2020-01-27 MED ORDER — FUROSEMIDE 10 MG/ML IJ SOLN
40.0000 mg | Freq: Two times a day (BID) | INTRAMUSCULAR | Status: DC
  Administered 2020-01-27 – 2020-01-29 (×4): 40 mg via INTRAVENOUS
  Filled 2020-01-27 (×4): qty 4

## 2020-01-27 MED ORDER — ARFORMOTEROL TARTRATE 15 MCG/2ML IN NEBU
15.0000 ug | INHALATION_SOLUTION | Freq: Two times a day (BID) | RESPIRATORY_TRACT | Status: DC
  Administered 2020-01-27 – 2020-02-14 (×36): 15 ug via RESPIRATORY_TRACT
  Filled 2020-01-27 (×38): qty 2

## 2020-01-27 MED ORDER — UMECLIDINIUM BROMIDE 62.5 MCG/INH IN AEPB: 1.0000 | INHALATION_SPRAY | Freq: Every day | RESPIRATORY_TRACT | Status: DC

## 2020-01-27 MED ORDER — METHYLPREDNISOLONE SODIUM SUCC 40 MG IJ SOLR
40.0000 mg | Freq: Two times a day (BID) | INTRAMUSCULAR | Status: DC
  Administered 2020-01-27 – 2020-01-28 (×3): 40 mg via INTRAVENOUS
  Filled 2020-01-27 (×3): qty 1

## 2020-01-27 NOTE — Progress Notes (Signed)
PROGRESS NOTE    [name] [name]  GGY:[id number] DOB: [birth year] DOA: 01/26/2020 PCP: Leonard Downing, MD    Chief Complaint  Patient presents with   Toe Pain   Code Sepsis    Brief Narrative:  As per H&P written by Dr. Manuella Ghazi on 01/26/2020 [name] [name] is a 64 y.o. female with medical history significant for COPD on 2L [place] at bedtime, hypertension, type 2 diabetes, obesity, bipolar disorder/anxiety disorder, ongoing tobacco use, questionable ongoing alcohol abuse, and prior polysubstance abuse with methamphetamine and alcohol who presented to the ED with progressive worsening of pain, erythema, and serosanguineous drainage from her right second toe.  This began to gradually develop after her daughter trimmed her nail too close to her skin approximately 2 weeks ago.  She has noticed some fever, chills, body aches, and mild headache as well as general malaise at home.  She tried soaking her foot at home with no improvement in her symptoms.  She denies any chest pain, shortness of breath, abdominal pain, nausea, vomiting, or dysuria.  She denies any upper respiratory symptoms as well.  She has not been vaccinated for COVID-19.   ED Course: Vital signs demonstrate ongoing sinus tachycardia and this is confirmed on EKG with heart rate approximately 110 bpm.  Temperature 1-1.5 Fahrenheit.  She is noted to have a leukocytosis of 12,900 as well as a thrombocytopenia of 129,000.  BUN is 38 and creatinine is elevated at 1.1 with baseline 0.3-0.5.  Chest x-ray with mild cardiomegaly and no other acute findings.  Covid testing negative.  Right foot x-ray with no soft tissue swelling or signs of osteomyelitis noted.  Lactic acid is 1.5.  Patient has been ordered and started 3 L fluid bolus as well as vancomycin and Rocephin for suspected sepsis secondary to cellulitis.  Assessment & Plan: 1-sepsis: In the setting of Streptococcus cellulitis/bacteremia.  Present on admission -Patient met sepsis  criteria on admission with sinus tachycardia, elevated white blood cells, febrile and with identified source of infection of cellulitic process.  She had acute kidney injury as organ dysfunction. -No signs of osteomyelitis on chest x-ray -Blood culture positive for Streptococcus -Initially started on vancomycin -Continue to follow ESR and CRP -Patient antibiotics adjusted to Ancef -Continue as needed antipyretics and supportive care. -Follow final culture results and sensitivity.  2-acute on chronic respiratory failure with hypoxia and hypercapnia -Appears to be a combination of COPD exacerbation, Underlying obesity hypoventilation syndrome and component of interstitial pulmonary edema/vascular congestion after fluid resuscitation as per sepsis protocol. -Patient started on BiPAP, transferred to stepdown, Lasix given and initiated on treatment for COPD exacerbation using nebulizer management, IV steroids and floor above. -Wean off oxygen supplementation as tolerated -Follow clinical response.  3-acute kidney injury -After initial fluid resuscitation and improvement in overall sepsis physiology patient renal function is back to normal -Will continue to minimize nephrotoxic agents, avoid hypotension follow renal function trend.  4-mild thrombocytopenia -Likely related to acute sepsis and underlying history of alcohol abuse -No signs of overt bleeding -Avoid the use of heparin products and follow platelets count -Using SCDs for DVT prophylaxis.  5-alcohol abuse -No major withdrawal symptoms appreciated -Continue CIWA monitoring -Continue thiamine and folic acid.  6-class II obesity  -Body mass index is 37.09 kg/m. -Low calorie diet, portion control and increase physical activity discussed with patient.  7-history of hypertension -Blood pressure overall stable -Continue holding amlodipine for now -Lasix has been given in order to treat fluid overload component.  8-history of  tobacco abuse -Cessation counseling has been provided -Continue nicotine patch.  9-depression/anxiety -Continue home medications -No hallucination no suicidal ideation currently.  10-type 2 diabetes mellitus -A1c 5.8 -While inpatient continue sliding scale insulin -At discharge will resume the use of Metformin in order to continue assisting with management of insulin resistant and obesity. -Modified carbohydrate diet and lifestyle changes has been encouraged.  DVT prophylaxis: SCD's Code Status: Full code Family Communication: daughter over the phone. 01/27/20 Disposition:   Status is: Inpatient  Dispo: The patient is from: Home              Anticipated d/c is to: To be determined              Anticipated d/c date is: to be determined.              Patient currently no medically stable for discharge, will be transferred to stepdown unit for BiPAP usage and close monitoring of her respiratory status.  Tylenol will be given; IV Ancef to be continue as part of the treatment for acute infection.  Due to complaining of COPD exacerbation we will also start her on nebulizer management, steroids, flutter valve and mucolytic's.     Consultants:   None    Procedures:  See below for x-ray reports.   Antimicrobials:  Ancef 01/27/20   Subjective: In acute respiratory distress, unable to speak in full sentences, spiking fever.  Reports no nausea or vomiting.  Objective: Vitals:   01/27/20 0853 01/27/20 0953 01/27/20 1053 01/27/20 1102  BP: 131/67 133/62  132/61  Pulse: (!) 123 (!) 121 (!) 118 (!) 115  Resp: (!) 34 (!) 22  (!) 22  Temp:  (!) 102.3 F (39.1 C)  (!) 102.6 F (39.2 C)  TempSrc:  Axillary  Axillary  SpO2: 96% 95% 98% 99%  Weight:    101.1 kg  Height:    '5\' 5"'  (1.651 m)    Intake/Output Summary (Last 24 hours) at 01/27/2020 1437 Last data filed at 01/27/2020 0300 Gross per 24 hour  Intake 4587.5 ml  Output --  Net 4587.5 ml   Filed Weights   01/26/20 1303  01/26/20 2224 01/27/20 1102  Weight: 99.8 kg 100.4 kg 101.1 kg    Examination: General exam: Appears in mild distress secondary to acute respiratory failure with hypoxia/hypercapnia; still spiking fever and having difficulty speaking in full sentences. Respiratory system: Mild use of accessory muscles, positive rhonchi bilaterally and diffuse expiratory wheezing.  Fine crackles discerned at bases bilaterally. Cardiovascular system: Sinus tachycardia, no murmurs, no rubs or gallops.  No JVD on exam. Gastrointestinal system: Abdomen is obese, nondistended, soft and nontender. No organomegaly or masses felt. Normal bowel sounds heard. Central nervous system: Alert and oriented. No focal neurological deficits. Extremities: Right lower extremity with diffuse erythematous changes in her anterior lateral aspect and second to with discoloration and swelling.  Skin: No petechiae.  Refer to media section in epic for images of her lower extremity at time of admission representing positive findings for acute cellulitic process. Psychiatry: Mood & affect appropriate.     Data Reviewed: I have personally reviewed following labs and imaging studies  CBC: Recent Labs  Lab 01/26/20 1350 01/27/20 0829  WBC 12.9* 10.2  NEUTROABS 11.3*  --   HGB 15.3* 13.7  HCT 48.8* 46.5*  MCV 92.4 96.3  PLT 129* 97*    Basic Metabolic Panel: Recent Labs  Lab 01/26/20 1350 01/27/20 0829  NA 137 140  K 4.1 4.3  CL 97* 103  CO2 28 27  GLUCOSE 166* 132*  BUN 38* 40*  CREATININE 1.10* 0.75  CALCIUM 9.0 8.2*  MG  --  1.9    GFR: Estimated Creatinine Clearance: 84.8 mL/min (by C-G formula based on SCr of 0.75 mg/dL).  Liver Function Tests: Recent Labs  Lab 01/26/20 1350 01/27/20 0829  AST 14* 12*  ALT 17 14  ALKPHOS 96 86  BILITOT 0.5 0.2*  PROT 7.8 6.7  ALBUMIN 3.7 2.9*    CBG: Recent Labs  Lab 01/26/20 1855 01/26/20 2220 01/27/20 0805  GLUCAP 139* 175* 135*     Recent Results (from  the past 240 hour(s))  SARS Coronavirus 2 by RT PCR (hospital order, performed in [facility] hospital lab) Nasopharyngeal Nasopharyngeal Swab     Status: None   Collection Time: 01/26/20  1:17 PM   Specimen: Nasopharyngeal Swab  Result Value Ref Range Status   SARS Coronavirus 2 NEGATIVE NEGATIVE Final    Comment: (NOTE) SARS-CoV-2 target nucleic acids are NOT DETECTED.  The SARS-CoV-2 RNA is generally detectable in upper and lower respiratory specimens during the acute phase of infection. The lowest concentration of SARS-CoV-2 viral copies this assay can detect is 250 copies / mL. A negative result does not preclude SARS-CoV-2 infection and should not be used as the sole basis for treatment or other patient management decisions.  A negative result may occur with improper specimen collection / handling, submission of specimen other than nasopharyngeal swab, presence of viral mutation(s) within the areas targeted by this assay, and inadequate number of viral copies (<250 copies / mL). A negative result must be combined with clinical observations, patient history, and epidemiological information.  Fact Sheet for Patients:   StrictlyIdeas.no  Fact Sheet for Healthcare Providers: BankingDealers.co.za  This test is not yet approved or  cleared by the [place] FDA and has been authorized for detection and/or diagnosis of SARS-CoV-2 by FDA under an Emergency Use Authorization (EUA).  This EUA will remain in effect (meaning this test can be used) for the duration of the COVID-19 declaration under Section 564(b)(1) of the Act, 21 U.S.C. section 360bbb-3(b)(1), unless the authorization is terminated or revoked sooner.  Performed at [facility], [address] [place], [place] [address]   Blood Culture (routine x 2)     Status: None (Preliminary result)   Collection Time: 01/26/20  1:50 PM   Specimen: Right Antecubital; Blood  Result  Value Ref Range Status   Specimen Description   Final    RIGHT ANTECUBITAL BOTTLES DRAWN AEROBIC AND ANAEROBIC Performed at [facility], [address] [place], [place] [address]    Special Requests   Final    Blood Culture adequate volume Performed at [facility], [address] [place], [place] [address]    Culture  Setup Time   Final    GRAM POSITIVE COCCI Gram Stain Report Called to,Read Back By and Verified With: KILMER,T @ 6644 ON 01/27/20 BY JUW IN BOTH AEROBIC AND ANAEROBIC BOTTLES GS DONE @ [facility] Organism ID to follow Performed at [facility], [place] [address] [place], [place] [address]    Culture [facility] POSITIVE COCCI  Final   Report Status PENDING  Incomplete  Blood Culture ID Panel (Reflexed)     Status: Abnormal   Collection Time: 01/26/20  1:50 PM  Result Value Ref Range Status   Enterococcus faecalis NOT DETECTED NOT DETECTED Final   Enterococcus Faecium NOT DETECTED NOT DETECTED Final  Listeria monocytogenes NOT DETECTED NOT DETECTED Final   Staphylococcus species NOT DETECTED NOT DETECTED Final   Staphylococcus aureus (BCID) NOT DETECTED NOT DETECTED Final   Staphylococcus epidermidis NOT DETECTED NOT DETECTED Final   Staphylococcus lugdunensis NOT DETECTED NOT DETECTED Final   Streptococcus species DETECTED (A) NOT DETECTED Final    Comment: CRITICAL RESULT CALLED TO, READ BACK BY AND VERIFIED WITH: G. Coffee PharmD 8:25 01/27/20 ([email or web address])    Streptococcus agalactiae NOT DETECTED NOT DETECTED Final   Streptococcus pneumoniae NOT DETECTED NOT DETECTED Final   Streptococcus pyogenes DETECTED (A) NOT DETECTED Final    Comment: CRITICAL RESULT CALLED TO, READ BACK BY AND VERIFIED WITH: G. Coffee PharmD 8:25 01/27/20 ([email or web address])    A.calcoaceticus-baumannii NOT DETECTED NOT DETECTED Final   Bacteroides fragilis NOT DETECTED NOT DETECTED Final   Enterobacterales NOT DETECTED NOT DETECTED Final   Enterobacter cloacae complex NOT DETECTED NOT DETECTED Final    Escherichia coli NOT DETECTED NOT DETECTED Final   Klebsiella aerogenes NOT DETECTED NOT DETECTED Final   Klebsiella oxytoca NOT DETECTED NOT DETECTED Final   Klebsiella pneumoniae NOT DETECTED NOT DETECTED Final   Proteus species NOT DETECTED NOT DETECTED Final   Salmonella species NOT DETECTED NOT DETECTED Final   Serratia marcescens NOT DETECTED NOT DETECTED Final   Haemophilus influenzae NOT DETECTED NOT DETECTED Final   Neisseria meningitidis NOT DETECTED NOT DETECTED Final   Pseudomonas aeruginosa NOT DETECTED NOT DETECTED Final   Stenotrophomonas maltophilia NOT DETECTED NOT DETECTED Final   Candida albicans NOT DETECTED NOT DETECTED Final   Candida auris NOT DETECTED NOT DETECTED Final   Candida glabrata NOT DETECTED NOT DETECTED Final   Candida krusei NOT DETECTED NOT DETECTED Final   Candida parapsilosis NOT DETECTED NOT DETECTED Final   Candida tropicalis NOT DETECTED NOT DETECTED Final   Cryptococcus neoformans/gattii NOT DETECTED NOT DETECTED Final    Comment: Performed at [facility], [address], [place], [place] [address]  Blood Culture (routine x 2)     Status: None (Preliminary result)   Collection Time: 01/26/20  2:00 PM   Specimen: Left Antecubital; Blood  Result Value Ref Range Status   Specimen Description   Final    LEFT ANTECUBITAL BOTTLES DRAWN AEROBIC AND ANAEROBIC Performed at [facility], [address] [place], [place] [address]    Special Requests   Final    Blood Culture adequate volume Performed at [facility], [address], [place], [place] [address]    Culture  Setup Time   Final    GRAM POSITIVE COCCI Gram Stain Report Called to,Read Back By and Verified With: KILMER,T @ 9150 ON 01/27/20 BY JUW AEROBIC BOTTLE ONLY GS DONE @ [facility] Performed at [facility], [address], [place], [place] [address]    Culture [facility] POSITIVE COCCI  Final   Report Status PENDING  Incomplete     Radiology Studies: DG CHEST PORT 1 VIEW  Result Date:  01/27/2020 CLINICAL DATA:  Shortness of breath. EXAM: PORTABLE CHEST 1 VIEW COMPARISON:  January 26, 2020. FINDINGS: Stable cardiomegaly. No pneumothorax or pleural effusion is noted. Increased multiple ill-defined opacities are noted throughout both lungs concerning for multifocal pneumonia. Bony thorax is unremarkable. IMPRESSION: Increased multiple ill-defined opacities are noted throughout both lungs concerning for multifocal pneumonia. Electronically Signed   By: Marijo Conception M.D.   On: 01/27/2020 09:47   DG Chest Port 1 View  Result Date: 01/26/2020 CLINICAL DATA:  Possible sepsis.  Right second toe  and leg pain. EXAM: PORTABLE CHEST 1 VIEW COMPARISON:  01/10/2020 FINDINGS: Cardiac silhouette is mildly enlarged. No mediastinal or hilar masses. No evidence of adenopathy. Lungs are hyperexpanded. Prominent bilateral interstitial markings are stable from the prior exam. No lung consolidation or evidence of edema. No pleural effusion or pneumothorax. Old healed left mid rib fracture. Skeletal structures are demineralized. IMPRESSION: 1. No acute cardiopulmonary disease. 2. Mild cardiomegaly. Electronically Signed   By: Lajean Manes M.D.   On: 01/26/2020 14:40   DG Foot Complete Right  Result Date: 01/26/2020 CLINICAL DATA:  Pain in the right second toe radiating to the right leg. Right second toe is discolored. Patient states swelling and discoloration began after having her toenails clipped. EXAM: RIGHT FOOT COMPLETE - 3+ VIEW COMPARISON:  None. FINDINGS: No fracture or bone lesion. There is no bone resorption to suggest osteomyelitis. Joints are normally aligned. There is second toe soft tissue swelling.  No soft tissue air. IMPRESSION: 1. No fracture, dislocation or evidence of osteomyelitis. Electronically Signed   By: Lajean Manes M.D.   On: 01/26/2020 14:42    Scheduled Meds:  amitriptyline  100 mg Oral QHS   arformoterol  15 mcg Nebulization BID   benztropine  0.5 mg Oral BID    budesonide (PULMICORT) nebulizer solution  0.5 mg Nebulization BID   busPIRone  10 mg Oral TID   Chlorhexidine Gluconate Cloth  6 each Topical Daily   divalproex  500 mg Oral BID   famotidine  20 mg Oral BID   folic acid  1 mg Oral Daily   furosemide  40 mg Intravenous Q12H   insulin aspart  0-5 Units Subcutaneous QHS   insulin aspart  0-9 Units Subcutaneous TID WC   methylPREDNISolone (SOLU-MEDROL) injection  40 mg Intravenous Q12H   nicotine  7 mg Transdermal Daily   QUEtiapine  50 mg Oral TID   sertraline  200 mg Oral Daily   thiamine  100 mg Oral Daily   Or   thiamine  100 mg Intravenous Daily   Continuous Infusions:   ceFAZolin (ANCEF) IV 2 g (01/27/20 1413)     LOS: 1 day    Time spent: 35 minutes  Barton Dubois, MD [facility]   To contact the attending provider between 7A-7P or the covering provider during after hours 7P-7A, please log into the web site [email or web address] and access using universal [place] password for that web site. If you do not have the password, please call the hospital operator.  01/27/2020, 2:37 PM

## 2020-01-27 NOTE — Progress Notes (Signed)
0840 RT notified nurse of change in status 51 Page Dr. Dyann Kief to see patient due to change in status  0853 VS taken and O2 turned up to 5L HFNC 0953 VS retaken and temp increased, PRN meds given 1057 Patient transferred to ICU Daughter at bedside when transferring patient to ICU

## 2020-01-27 NOTE — Progress Notes (Signed)
CRITICAL VALUE ALERT  Critical Value:  PCO2 70.5  Date & Time Notied:  01/27/2020 1012  Provider Notified: [place]  Orders Received/Actions taken: no orders given

## 2020-01-27 NOTE — Progress Notes (Signed)
   01/27/20 0953  Assess: MEWS Score  Temp (!) 102.3 F (39.1 C)  BP 133/62  Pulse Rate (!) 121  Resp (!) 22  SpO2 95 %  O2 Device HFNC  Heater temperature 42.8 F (6 C)  Assess: MEWS Score  MEWS Temp 2  MEWS Systolic 0  MEWS Pulse 2  MEWS RR 1  MEWS LOC 0  MEWS Score 5  MEWS Score Color Red  Assess: if the MEWS score is Yellow or Red  Were vital signs taken at a resting state? Yes  Focused Assessment No change from prior assessment  Early Detection of Sepsis Score *See Row Information* High  MEWS guidelines implemented *See Row Information* Yes  Treat  Pain Scale 0-10  Pain Score 2  Pain Type Acute pain  Pain Location Abdomen  Pain Orientation Right  Pain Descriptors / Indicators Aching  Pain Frequency Intermittent  Pain Onset Sudden  Pain Intervention(s) MD notified (Comment)  Take Vital Signs  Increase Vital Sign Frequency  Red: Q 1hr X 4 then Q 4hr X 4, if remains red, continue Q 4hrs  Escalate  MEWS: Escalate Red: discuss with charge nurse/RN and provider, consider discussing with RRT  Notify: Charge Nurse/RN  Name of Charge Nurse/RN Notified shannon brown  Date Charge Nurse/RN Notified 01/27/20  Time Charge Nurse/RN Notified [phone]  Notify: Provider  Provider Name/Title madera  Date Provider Notified 01/27/20  Time Provider Notified [phone]  Notification Type Page  Notification Reason Change in status  Response No new orders

## 2020-01-27 NOTE — Progress Notes (Signed)
   01/27/20 0853  Assess: MEWS Score  BP 131/67  Pulse Rate (!) 123  Resp (!) 34  SpO2 96 %  O2 Device Nasal Cannula  O2 Flow Rate (L/min) 5 L/min  Assess: MEWS Score  MEWS Temp 0  MEWS Systolic 0  MEWS Pulse 2  MEWS RR 2  MEWS LOC 0  MEWS Score 4  MEWS Score Color Red  Assess: if the MEWS score is Yellow or Red  Were vital signs taken at a resting state? Yes  Focused Assessment Change from prior assessment (see assessment flowsheet)  Early Detection of Sepsis Score *See Row Information* High  MEWS guidelines implemented *See Row Information* Yes  Treat  Pain Scale 0-10  Pain Score 4  Pain Type Acute pain  Pain Location Abdomen  Pain Orientation Right  Pain Descriptors / Indicators Aching  Pain Frequency Intermittent  Pain Onset Sudden  Pain Intervention(s) RN made aware  Take Vital Signs  Increase Vital Sign Frequency  Red: Q 1hr X 4 then Q 4hr X 4, if remains red, continue Q 4hrs  Escalate  MEWS: Escalate Red: discuss with charge nurse/RN and provider, consider discussing with RRT  Notify: Charge Nurse/RN  Name of Charge Nurse/RN Notified shannon brown  Date Charge Nurse/RN Notified 01/27/20  Time Charge Nurse/RN Notified [phone]  Notify: Provider  Provider Name/Title madera  Date Provider Notified 01/27/20  Time Provider Notified [phone]  Notification Type Page  Notification Reason Change in status  Response No new orders

## 2020-01-28 ENCOUNTER — Inpatient Hospital Stay (HOSPITAL_COMMUNITY): Payer: Medicaid Other

## 2020-01-28 ENCOUNTER — Inpatient Hospital Stay (HOSPITAL_COMMUNITY): Payer: Medicaid Other | Admitting: Anesthesiology

## 2020-01-28 DIAGNOSIS — R000 Tachycardia, unspecified: Secondary | ICD-10-CM

## 2020-01-28 DIAGNOSIS — R6520 Severe sepsis without septic shock: Secondary | ICD-10-CM

## 2020-01-28 DIAGNOSIS — Z20822 Contact with and (suspected) exposure to covid-19: Secondary | ICD-10-CM | POA: Diagnosis not present

## 2020-01-28 DIAGNOSIS — Z515 Encounter for palliative care: Secondary | ICD-10-CM | POA: Diagnosis not present

## 2020-01-28 DIAGNOSIS — Z978 Presence of other specified devices: Secondary | ICD-10-CM

## 2020-01-28 DIAGNOSIS — A419 Sepsis, unspecified organism: Secondary | ICD-10-CM

## 2020-01-28 DIAGNOSIS — Z66 Do not resuscitate: Secondary | ICD-10-CM | POA: Diagnosis not present

## 2020-01-28 DIAGNOSIS — A401 Sepsis due to streptococcus, group B: Secondary | ICD-10-CM | POA: Diagnosis not present

## 2020-01-28 DIAGNOSIS — K219 Gastro-esophageal reflux disease without esophagitis: Secondary | ICD-10-CM

## 2020-01-28 LAB — BLOOD GAS, ARTERIAL
Acid-Base Excess: 12.3 mmol/L — ABNORMAL HIGH (ref 0.0–2.0)
Acid-Base Excess: 10.5 mmol/L — ABNORMAL HIGH (ref 0.0–2.0)
Bicarbonate: 31.5 mmol/L — ABNORMAL HIGH (ref 20.0–28.0)
Bicarbonate: 31.8 mmol/L — ABNORMAL HIGH (ref 20.0–28.0)
FIO2: 60
FIO2: 100
O2 Saturation: 92.8 %
O2 Saturation: 99.1 %
Patient temperature: 38.3
Patient temperature: 38.2
pCO2 arterial: 108 mmHg (ref 32.0–48.0)
pCO2 arterial: 78.6 mmHg (ref 32.0–48.0)
pH, Arterial: 7.203 — ABNORMAL LOW (ref 7.350–7.450)
pH, Arterial: 7.3 — ABNORMAL LOW (ref 7.350–7.450)
pO2, Arterial: 79.9 mmHg — ABNORMAL LOW (ref 83.0–108.0)
pO2, Arterial: 170 mmHg — ABNORMAL HIGH (ref 83.0–108.0)

## 2020-01-28 LAB — BASIC METABOLIC PANEL
Anion gap: 13 (ref 5–15)
BUN: 36 mg/dL — ABNORMAL HIGH (ref 8–23)
CO2: 30 mmol/L (ref 22–32)
Calcium: 9 mg/dL (ref 8.9–10.3)
Chloride: 99 mmol/L (ref 98–111)
Creatinine, Ser: 0.61 mg/dL (ref 0.44–1.00)
GFR calc Af Amer: >60 mL/min (ref >60)
GFR calc non Af Amer: >60 mL/min (ref >60)
Glucose, Bld: 171 mg/dL — ABNORMAL HIGH (ref 70–99)
Potassium: 3.7 mmol/L (ref 3.5–5.1)
Sodium: 142 mmol/L (ref 135–145)

## 2020-01-28 LAB — CBC
HCT: 48.9 % — ABNORMAL HIGH (ref 36.0–46.0)
Hemoglobin: 14.3 g/dL (ref 12.0–15.0)
MCH: 28.3 pg (ref 26.0–34.0)
MCHC: 29.2 g/dL — ABNORMAL LOW (ref 30.0–36.0)
MCV: 96.6 fL (ref 80.0–100.0)
Platelets: 103 10*3/uL — ABNORMAL LOW (ref 150–400)
RBC: 5.06 MIL/uL (ref 3.87–5.11)
RDW: 15.2 % (ref 11.5–15.5)
WBC: 12.3 10*3/uL — ABNORMAL HIGH (ref 4.0–10.5)
nRBC: 0 % (ref 0.0–0.2)

## 2020-01-28 LAB — GLUCOSE, CAPILLARY
Glucose-Capillary: 178 mg/dL — ABNORMAL HIGH (ref 70–99)
Glucose-Capillary: 129 mg/dL — ABNORMAL HIGH (ref 70–99)
Glucose-Capillary: 164 mg/dL — ABNORMAL HIGH (ref 70–99)
Glucose-Capillary: 134 mg/dL — ABNORMAL HIGH (ref 70–99)

## 2020-01-28 LAB — MAGNESIUM: Magnesium: 2.2 mg/dL (ref 1.7–2.4)

## 2020-01-28 LAB — SEDIMENTATION RATE: Sed Rate: 55 mm/hr — ABNORMAL HIGH (ref 0–22)

## 2020-01-28 LAB — C-REACTIVE PROTEIN: CRP: 68.5 mg/dL — ABNORMAL HIGH (ref <1.0)

## 2020-01-28 MED ORDER — BENZTROPINE MESYLATE 0.5 MG PO TABS
0.5000 mg | ORAL_TABLET | Freq: Two times a day (BID) | ORAL | Status: DC
  Administered 2020-01-28 – 2020-02-26 (×47): 0.5 mg
  Filled 2020-01-28 (×60): qty 1

## 2020-01-28 MED ORDER — SODIUM CHLORIDE 0.9 % IV SOLN
INTRAVENOUS | Status: DC | PRN
  Administered 2020-01-28: 1000 mL via INTRAVENOUS
  Administered 2020-02-15: 40 mL/h via INTRAVENOUS

## 2020-01-28 MED ORDER — DIGOXIN 0.25 MG/ML IJ SOLN: 0.2500 mg | Freq: Every day | INTRAMUSCULAR | Status: AC

## 2020-01-28 MED ORDER — DILTIAZEM HCL 25 MG/5ML IV SOLN
20.0000 mg | Freq: Once | INTRAVENOUS | Status: AC
  Administered 2020-01-28: 20 mg via INTRAVENOUS
  Filled 2020-01-28: qty 5

## 2020-01-28 MED ORDER — SODIUM CHLORIDE 0.9 % IV BOLUS
500.0000 mL | Freq: Once | INTRAVENOUS | Status: AC
  Administered 2020-01-28: 500 mL via INTRAVENOUS

## 2020-01-28 MED ORDER — CHLORHEXIDINE GLUCONATE 0.12% ORAL RINSE (MEDLINE KIT)
15.0000 mL | Freq: Two times a day (BID) | OROMUCOSAL | Status: DC
  Administered 2020-01-28 – 2020-02-06 (×18): 15 mL via OROMUCOSAL

## 2020-01-28 MED ORDER — INSULIN ASPART 100 UNIT/ML ~~LOC~~ SOLN
0.0000 [IU] | SUBCUTANEOUS | Status: DC
  Administered 2020-01-28 – 2020-01-29 (×4): 1 [IU] via SUBCUTANEOUS
  Administered 2020-01-29 (×2): 2 [IU] via SUBCUTANEOUS
  Administered 2020-01-29 – 2020-01-30 (×2): 1 [IU] via SUBCUTANEOUS
  Administered 2020-01-30: 2 [IU] via SUBCUTANEOUS
  Administered 2020-01-30: 1 [IU] via SUBCUTANEOUS
  Administered 2020-01-30: 2 [IU] via SUBCUTANEOUS
  Administered 2020-01-30: 1 [IU] via SUBCUTANEOUS
  Administered 2020-01-30: 3 [IU] via SUBCUTANEOUS
  Administered 2020-01-31 (×2): 2 [IU] via SUBCUTANEOUS
  Administered 2020-01-31: 1 [IU] via SUBCUTANEOUS
  Administered 2020-01-31 (×3): 2 [IU] via SUBCUTANEOUS
  Administered 2020-01-31: 1 [IU] via SUBCUTANEOUS
  Administered 2020-02-01 (×2): 2 [IU] via SUBCUTANEOUS
  Administered 2020-02-01: 1 [IU] via SUBCUTANEOUS
  Administered 2020-02-01 (×2): 2 [IU] via SUBCUTANEOUS
  Administered 2020-02-01 – 2020-02-02 (×2): 1 [IU] via SUBCUTANEOUS
  Administered 2020-02-02 (×2): 2 [IU] via SUBCUTANEOUS
  Administered 2020-02-02: 1 [IU] via SUBCUTANEOUS
  Administered 2020-02-02: 3 [IU] via SUBCUTANEOUS
  Administered 2020-02-03: 2 [IU] via SUBCUTANEOUS
  Administered 2020-02-03: 1 [IU] via SUBCUTANEOUS
  Administered 2020-02-03: 2 [IU] via SUBCUTANEOUS
  Administered 2020-02-03 – 2020-02-07 (×4): 1 [IU] via SUBCUTANEOUS
  Administered 2020-02-08: 2 [IU] via SUBCUTANEOUS
  Administered 2020-02-08 (×2): 1 [IU] via SUBCUTANEOUS
  Administered 2020-02-09: 2 [IU] via SUBCUTANEOUS
  Administered 2020-02-09 (×4): 1 [IU] via SUBCUTANEOUS
  Administered 2020-02-09: 2 [IU] via SUBCUTANEOUS
  Administered 2020-02-09: 1 [IU] via SUBCUTANEOUS
  Administered 2020-02-10 – 2020-02-11 (×8): 2 [IU] via SUBCUTANEOUS
  Administered 2020-02-11: 3 [IU] via SUBCUTANEOUS
  Administered 2020-02-11 – 2020-02-12 (×4): 2 [IU] via SUBCUTANEOUS
  Administered 2020-02-13 (×2): 1 [IU] via SUBCUTANEOUS
  Administered 2020-02-13 (×2): 2 [IU] via SUBCUTANEOUS
  Administered 2020-02-13: 1 [IU] via SUBCUTANEOUS
  Administered 2020-02-14 (×3): 2 [IU] via SUBCUTANEOUS
  Administered 2020-02-14 – 2020-02-15 (×5): 1 [IU] via SUBCUTANEOUS
  Administered 2020-02-15: 2 [IU] via SUBCUTANEOUS
  Administered 2020-02-15: 1 [IU] via SUBCUTANEOUS
  Administered 2020-02-15 (×2): 2 [IU] via SUBCUTANEOUS
  Administered 2020-02-16 – 2020-02-18 (×7): 1 [IU] via SUBCUTANEOUS
  Administered 2020-02-18: 2 [IU] via SUBCUTANEOUS
  Administered 2020-02-18 (×2): 1 [IU] via SUBCUTANEOUS
  Administered 2020-02-18 – 2020-02-19 (×2): 2 [IU] via SUBCUTANEOUS
  Administered 2020-02-19: 1 [IU] via SUBCUTANEOUS
  Administered 2020-02-19: 2 [IU] via SUBCUTANEOUS
  Administered 2020-02-19: 3 [IU] via SUBCUTANEOUS
  Administered 2020-02-20: 1 [IU] via SUBCUTANEOUS
  Administered 2020-02-20: 2 [IU] via SUBCUTANEOUS
  Administered 2020-02-20: 1 [IU] via SUBCUTANEOUS
  Administered 2020-02-20 – 2020-02-21 (×4): 2 [IU] via SUBCUTANEOUS
  Administered 2020-02-21 – 2020-02-22 (×3): 1 [IU] via SUBCUTANEOUS
  Administered 2020-02-22 (×2): 2 [IU] via SUBCUTANEOUS
  Administered 2020-02-22: 1 [IU] via SUBCUTANEOUS
  Administered 2020-02-22: 2 [IU] via SUBCUTANEOUS
  Administered 2020-02-23: 3 [IU] via SUBCUTANEOUS
  Administered 2020-02-23: 5 [IU] via SUBCUTANEOUS
  Administered 2020-02-23: 2 [IU] via SUBCUTANEOUS
  Administered 2020-02-23: 1 [IU] via SUBCUTANEOUS
  Administered 2020-02-23 – 2020-02-24 (×4): 2 [IU] via SUBCUTANEOUS
  Administered 2020-02-24: 1 [IU] via SUBCUTANEOUS
  Administered 2020-02-24 (×2): 2 [IU] via SUBCUTANEOUS
  Administered 2020-02-24: 1 [IU] via SUBCUTANEOUS
  Administered 2020-02-25: 2 [IU] via SUBCUTANEOUS
  Administered 2020-02-25: 3 [IU] via SUBCUTANEOUS
  Administered 2020-02-25 – 2020-02-26 (×4): 2 [IU] via SUBCUTANEOUS
  Administered 2020-02-26: 5 [IU] via SUBCUTANEOUS
  Administered 2020-02-26: 1 [IU] via SUBCUTANEOUS
  Administered 2020-02-26 – 2020-02-27 (×3): 2 [IU] via SUBCUTANEOUS

## 2020-01-28 MED ORDER — MIDAZOLAM 50MG/50ML (1MG/ML) PREMIX INFUSION
0.5000 mg/h | INTRAVENOUS | Status: DC
  Administered 2020-01-28 – 2020-01-29 (×2): 0.5 mg/h via INTRAVENOUS
  Administered 2020-01-31 – 2020-02-01 (×3): 2 mg/h via INTRAVENOUS
  Administered 2020-02-02: 3.5 mg/h via INTRAVENOUS
  Administered 2020-02-03: 7.5 mg/h via INTRAVENOUS
  Administered 2020-02-03: 4.5 mg/h via INTRAVENOUS
  Administered 2020-02-04: 9 mg/h via INTRAVENOUS
  Administered 2020-02-04 (×3): 10 mg/h via INTRAVENOUS
  Administered 2020-02-04 – 2020-02-05 (×2): 9 mg/h via INTRAVENOUS
  Administered 2020-02-05 – 2020-02-06 (×4): 10 mg/h via INTRAVENOUS
  Administered 2020-02-07: 3 mg/h via INTRAVENOUS
  Filled 2020-01-28 (×20): qty 50

## 2020-01-28 MED ORDER — FENTANYL CITRATE (PF) 100 MCG/2ML IJ SOLN
50.0000 ug | INTRAMUSCULAR | Status: AC | PRN
  Administered 2020-02-01 (×3): 50 ug via INTRAVENOUS
  Filled 2020-01-28 (×3): qty 2

## 2020-01-28 MED ORDER — PROPOFOL 1000 MG/100ML IV EMUL
0.0000 ug/kg/min | INTRAVENOUS | Status: DC
  Administered 2020-01-28: 15 ug/kg/min via INTRAVENOUS

## 2020-01-28 MED ORDER — FENTANYL CITRATE (PF) 100 MCG/2ML IJ SOLN
50.0000 ug | INTRAMUSCULAR | Status: DC | PRN
  Administered 2020-01-28 – 2020-01-29 (×4): 100 ug via INTRAVENOUS
  Administered 2020-01-29: 50 ug via INTRAVENOUS
  Administered 2020-01-29 (×3): 100 ug via INTRAVENOUS
  Administered 2020-01-29: 50 ug via INTRAVENOUS
  Administered 2020-01-29 – 2020-01-30 (×2): 100 ug via INTRAVENOUS
  Administered 2020-01-30: 200 ug via INTRAVENOUS
  Administered 2020-01-30 – 2020-01-31 (×6): 100 ug via INTRAVENOUS
  Administered 2020-01-31 (×2): 200 ug via INTRAVENOUS
  Administered 2020-02-01 – 2020-02-03 (×7): 100 ug via INTRAVENOUS
  Administered 2020-02-03: 200 ug via INTRAVENOUS
  Administered 2020-02-03 (×4): 100 ug via INTRAVENOUS
  Administered 2020-02-03: 200 ug via INTRAVENOUS
  Administered 2020-02-04 (×3): 100 ug via INTRAVENOUS
  Administered 2020-02-04: 200 ug via INTRAVENOUS
  Administered 2020-02-04: 100 ug via INTRAVENOUS
  Filled 2020-01-28 (×6): qty 2
  Filled 2020-01-28: qty 4
  Filled 2020-01-28 (×3): qty 2
  Filled 2020-01-28: qty 4
  Filled 2020-01-28 (×3): qty 2
  Filled 2020-01-28 (×2): qty 4
  Filled 2020-01-28: qty 2
  Filled 2020-01-28: qty 4
  Filled 2020-01-28 (×15): qty 2
  Filled 2020-01-28: qty 4
  Filled 2020-01-28 (×4): qty 2

## 2020-01-28 MED ORDER — METOPROLOL TARTRATE 5 MG/5ML IV SOLN
5.0000 mg | Freq: Once | INTRAVENOUS | Status: AC
  Administered 2020-01-28: 5 mg via INTRAVENOUS
  Filled 2020-01-28: qty 5

## 2020-01-28 MED ORDER — DILTIAZEM HCL-DEXTROSE 125-5 MG/125ML-% IV SOLN (PREMIX)
5.0000 mg/h | INTRAVENOUS | Status: DC
  Administered 2020-01-28: 10 mg/h via INTRAVENOUS
  Administered 2020-01-28: 5 mg/h via INTRAVENOUS
  Filled 2020-01-28 (×2): qty 125

## 2020-01-28 MED ORDER — HYDROCORTISONE NA SUCCINATE PF 100 MG IJ SOLR
50.0000 mg | Freq: Four times a day (QID) | INTRAMUSCULAR | Status: DC
  Administered 2020-01-28 – 2020-01-29 (×4): 50 mg via INTRAVENOUS
  Filled 2020-01-28 (×4): qty 2

## 2020-01-28 MED ORDER — DOCUSATE SODIUM 50 MG/5ML PO LIQD
100.0000 mg | Freq: Two times a day (BID) | ORAL | Status: DC
  Administered 2020-01-28 – 2020-02-07 (×19): 100 mg
  Filled 2020-01-28 (×20): qty 10

## 2020-01-28 MED ORDER — DILTIAZEM HCL 25 MG/5ML IV SOLN
15.0000 mg | Freq: Once | INTRAVENOUS | Status: AC
  Administered 2020-01-28: 15 mg via INTRAVENOUS

## 2020-01-28 MED ORDER — VALPROIC ACID 250 MG/5ML PO SOLN
500.0000 mg | Freq: Two times a day (BID) | ORAL | Status: DC
  Administered 2020-01-29 – 2020-02-12 (×28): 500 mg
  Filled 2020-01-28 (×34): qty 10

## 2020-01-28 MED ORDER — POLYETHYLENE GLYCOL 3350 17 G PO PACK
17.0000 g | PACK | Freq: Every day | ORAL | Status: DC
  Administered 2020-01-29 – 2020-02-07 (×8): 17 g via ORAL
  Filled 2020-01-28 (×9): qty 1

## 2020-01-28 MED ORDER — DOCUSATE SODIUM 50 MG/5ML PO LIQD: 100.0000 mg | Freq: Two times a day (BID) | ORAL | Status: DC

## 2020-01-28 MED ORDER — ENOXAPARIN SODIUM 40 MG/0.4ML ~~LOC~~ SOLN
40.0000 mg | SUBCUTANEOUS | Status: DC
  Administered 2020-01-28: 40 mg via SUBCUTANEOUS
  Filled 2020-01-28: qty 0.4

## 2020-01-28 MED ORDER — PANTOPRAZOLE SODIUM 40 MG IV SOLR
40.0000 mg | INTRAVENOUS | Status: DC
  Administered 2020-01-28 – 2020-02-03 (×7): 40 mg via INTRAVENOUS
  Filled 2020-01-28 (×7): qty 40

## 2020-01-28 MED ORDER — ORAL CARE MOUTH RINSE
15.0000 mL | OROMUCOSAL | Status: DC
  Administered 2020-01-28 – 2020-02-07 (×91): 15 mL via OROMUCOSAL

## 2020-01-28 MED ORDER — FOLIC ACID 1 MG PO TABS
1.0000 mg | ORAL_TABLET | Freq: Every day | ORAL | Status: DC
  Administered 2020-01-29 – 2020-02-12 (×14): 1 mg
  Filled 2020-01-28 (×18): qty 1

## 2020-01-28 NOTE — Progress Notes (Signed)
PICC line order received. Patient has positive Blood cultures on 9/6. VAST team will attempt to gain better peripheral IV access with ultrasound. Will reassess need for more aggressive IV access if unsuccessful. Would recommend repeat Blood cultures and to have them result negative prior to placing PICC line. MD notified

## 2020-01-28 NOTE — Anesthesia Postprocedure Evaluation (Signed)
Anesthesia Post Note  Patient: Analeah Brame  Procedure(s) Performed: AN AD [place]  Patient location during evaluation: ICU Anesthesia Type: General Level of consciousness: patient remains intubated per anesthesia plan Pain management: pain level controlled Vital Signs Assessment: post-procedure vital signs reviewed and stable Respiratory status: patient remains intubated per anesthesia plan Cardiovascular status: blood pressure returned to baseline Anesthetic complications: no   No complications documented.   Last Vitals:  Vitals:   01/28/20 1118 01/28/20 1257  BP:    Pulse: (!) 142   Resp: (!) 35 (!) 22  Temp: (!) 38.3 C   SpO2: 94% 100%    Last Pain:  Vitals:   01/28/20 1118  TempSrc: Rectal  PainSc:                  Louann Sjogren

## 2020-01-28 NOTE — Progress Notes (Addendum)
[facility] night shift.  The staff reports that the patient has been persistently tachycardic in the 140s and 150s.  She has been febrile and received acetaminophen earlier.  She is allergic to diclofenac.  She is off maintenance fluid after showing signs of volume overload following an initial NS 2000 mL bolus followed by 150 mL/h of NS for about 18 to 19 hours.  Fluids were held and diuretic was given.  She is off of amlodipine due to developing hypotension.  She is currently sleeping on BiPAP ventilation mode and does not seem to be in acute distress.  A 500 mL bolus along with metoprolol 5 mg IVP x1 dose ordered.  I will check her CBC and electrolytes.  Tennis Must, MD.  [phone] addendum:  The staff reports that the patient's heart rate is in the 130s and 140s despite above mentioned treatment.  They also report that the patient has very poor IV axis.  Cardizem 20 mg IVP x1 dose ordered.  PICC line placement was requested.  Tennis Must, MD.

## 2020-01-28 NOTE — Progress Notes (Signed)
Patient's HR too high for echo at 1pm and 4pm. Spoke with nurse, patient on cardizem will attempt echo tomorrow around [place], RDCS

## 2020-01-28 NOTE — Anesthesia Preprocedure Evaluation (Signed)
Anesthesia Evaluation  Preop documentation limited or incomplete due to emergent nature of procedure.  Airway Mallampati: I  TM Distance: >3 FB Neck ROM: full    Dental  (+) Edentulous Upper, Edentulous Lower   Pulmonary shortness of breath and lying, COPD, Current Smoker,      unstable     Cardiovascular hypertension,  Rhythm:regular Rate:Tachycardia     Neuro/Psych  Headaches, PSYCHIATRIC DISORDERS Anxiety Depression Bipolar Disorder    GI/Hepatic negative GI ROS, Neg liver ROS,   Endo/Other  diabetesHypothyroidism   Renal/GU negative Renal ROS     Musculoskeletal   Abdominal   Peds  Hematology negative hematology ROS (+)   Anesthesia Other Findings   Reproductive/Obstetrics                             Anesthesia Physical Anesthesia Plan  ASA: IV and emergent  Anesthesia Plan: Rapid Sequence and Cricoid Pressure   Post-op Pain Management:    Induction:   PONV Risk Score and Plan:   Airway Management Planned:   Additional Equipment:   Intra-op Plan:   Post-operative Plan:   Informed Consent:   Plan Discussed with: CRNA  Anesthesia Plan Comments:         Anesthesia Quick Evaluation

## 2020-01-28 NOTE — Progress Notes (Signed)
Able to place 2 20 gauge IVs in bilateral upper forearms. MD notified. PICC line order will be discontinued for now. VAST team will continue to monitor patient for further vascular access needs.

## 2020-01-28 NOTE — Consult Note (Signed)
NAME:  [name] [name], MRN:  [id number], DOB:  Jul 11, 1955, LOS: 2 ADMISSION DATE:  01/26/2020, CONSULTATION DATE:  01/28/20 REFERRING MD:  O[facility]/ Triad, CHIEF COMPLAINT:  resp distress    Brief History   72 yowf smoker admit 9/5 with R toe pain/ leg swelling p had nails cut with assoc chills/aches/  presumed cellulitis/ sepsis/ RAF with worse wob/sats on 9/7 so intubated by anestesia and PCCM service consulted.   History of present illness   Per H/P as pt intubated prior to PCCM consult:  64 y.o. female with medical history significant for COPD on 2L [place] at bedtime, hypertension, type 2 diabetes, obesity, bipolar disorder/anxiety disorder, ongoing tobacco use, questionable ongoing alcohol abuse, and prior polysubstance abuse with methamphetamine and alcohol who presented to the ED with progressive worsening of pain, erythema, and serosanguineous drainage from her right second toe.  This began to gradually develop after her daughter trimmed her nail too close to her skin approximately 2 weeks ago.  She had noticed some fever, chills, body aches, and mild headache as well as general malaise at home.  She tried soaking her foot at home with no improvement in her symptoms.  She denied any chest pain, shortness of breath, abdominal pain, nausea, vomiting, or dysuria.  She denied any upper respiratory symptoms as well.        Past Medical History  COPD/ 02 dep/ still smoking PTA  HBP DM  Obesity Bipolar ? Etoh abuse  Significant Hospital Events    Consults:  PCCM pm 9/7  Procedures:  Oral ET 9/7   Significant Diagnostic Tests:    Micro Data:  SARS covid   01/26/20 BC x 2  9/5  Group A strep BC x 2  9/7 >>>  Antimicrobials:  Vanc 9/5 only Rocephin 9/5 only  Ancef 9/6 >>>   Scheduled Meds: . amitriptyline  100 mg Oral QHS  . arformoterol  15 mcg Nebulization BID  . benztropine  0.5 mg Oral BID  . budesonide (PULMICORT) nebulizer solution  0.5 mg Nebulization BID  . busPIRone  10  mg Oral TID  . Chlorhexidine Gluconate Cloth  6 each Topical Daily  . divalproex  500 mg Oral BID  . docusate  100 mg Oral BID  . folic acid  1 mg Oral Daily  . furosemide  40 mg Intravenous Q12H  . insulin aspart  0-5 Units Subcutaneous QHS  . insulin aspart  0-9 Units Subcutaneous TID WC  . methylPREDNISolone (SOLU-MEDROL) injection  40 mg Intravenous Q12H  . nicotine  7 mg Transdermal Daily  . pantoprazole (PROTONIX) IV  40 mg Intravenous Q24H  . polyethylene glycol  17 g Oral Daily  . thiamine  100 mg Oral Daily   Or  . thiamine  100 mg Intravenous Daily   Continuous Infusions: . sodium chloride Stopped (01/28/20 0640)  .  ceFAZolin (ANCEF) IV 200 mL/hr at 01/28/20 0649  . diltiazem (CARDIZEM) infusion 5 mg/hr (01/28/20 1308)  . midazolam     PRN Meds:.sodium chloride, acetaminophen **OR** acetaminophen, fentaNYL (SUBLIMAZE) injection, fentaNYL (SUBLIMAZE) injection, levalbuterol, LORazepam **OR** LORazepam, ondansetron **OR** ondansetron (ZOFRAN) IV  Interim history/subjective:  Sedated on vent / still febrile but hemodynamics ok   Objective   Blood pressure (!) 144/116, pulse (!) 142, temperature (!) 100.9 F (38.3 C), temperature source Rectal, resp. rate (!) 22, height 5\' 5"  (1.651 m), weight 99.7 kg, SpO2 100 %.    Vent Mode: PRVC FiO2 (%):  [50 %-100 %] 100 %  Set Rate:  [22 bmp] 22 bmp Vt Set:  [450 mL] 450 mL PEEP:  [5 cmH20] 5 cmH20 Plateau Pressure:  [23 cmH20] 23 cmH20   Intake/Output Summary (Last 24 hours) at 01/28/2020 1402 Last data filed at 01/28/2020 1000 Gross per 24 hour  Intake 950.11 ml  Output 3000 ml  Net -2049.89 ml   Filed Weights   01/26/20 2224 01/27/20 1102 01/28/20 0500  Weight: 100.4 kg 101.1 kg 99.7 kg    Examination: Tmax  102 .4 - slt downard trend General: chronically ill wf sedated on vent HENT: oral et  Lungs: minimal insp/exp rhonchi bilaterally/ no secretions Cardiovascular: IRIR pulse 130-148  Abdomen: soft Extremities:  warm on R with erythema/severe with coalscence to knee extending in patches  Neuro: sedated on vent      I personally reviewed images and agree with radiology impression as follows:  CXR:   9/7 Tube positions as described without pneumothorax. It may be prudent to consider advancing nasogastric tube 4-5 cm to insure that both the tip and side port are well within the stomach.  Underlying apparent interstitial fibrosis. No frank edema or airspace opacity. A degree of interstitial edema superimposed on fibrosis is difficult to exclude in this circumstance. No consolidation.  Stable cardiac prominence.  No adenopathy demonstrable.  Resolved Hospital Problem list      Assessment & Plan:   1)  Acute respiratory failure/ hypoxemia and hypercabia  in setting of sepsis/ gm pos bacteremia from apparent cellulitis RLE  - ARDS protocol ok to use here  - adequate sedation at present - no role for solumedrol here > changed to hydrocortisone   2)  AFib with RAF > cardizem drip, taper up as bp tol, add amiodarone prn   3)  Severe cellulitis RLE p cutting toes/ warm foot and good pulse so no compartment issues yet  ? May need MRI to r/o fasciitis/ underlying osteomyelitis  But for now nothing to add.     Best practice:  Diet: npo Pain/Anxiety/Delirium protocol (if indicated): sedated on vent VAP protocol (if indicated):  DVT prophylaxis: lovenox GI prophylaxis: protonix Glucose control: per triad  Mobility: bed rest Code Status: full  Family Communication: per triad  Disposition: ICU  Labs   CBC: Recent Labs  Lab 01/26/20 1350 01/27/20 0829 01/28/20 0404  WBC 12.9* 10.2 12.3*  NEUTROABS 11.3*  --   --   HGB 15.3* 13.7 14.3  HCT 48.8* 46.5* 48.9*  MCV 92.4 96.3 96.6  PLT 129* 97* 103*    Basic Metabolic Panel: Recent Labs  Lab 01/26/20 1350 01/27/20 0829 01/28/20 0404  NA 137 140 142  K 4.1 4.3 3.7  CL 97* 103 99  CO2 28 27 30   GLUCOSE 166* 132* 171*  BUN 38*  40* 36*  CREATININE 1.10* 0.75 0.61  CALCIUM 9.0 8.2* 9.0  MG  --  1.9 2.2   GFR: Estimated Creatinine Clearance: 84.2 mL/min (by C-G formula based on SCr of 0.61 mg/dL). Recent Labs  Lab 01/26/20 1350 01/26/20 1611 01/27/20 0829 01/28/20 0404  PROCALCITON  --   --  3.34  --   WBC 12.9*  --  10.2 12.3*  LATICACIDVEN 1.5 0.8 0.8  --     Liver Function Tests: Recent Labs  Lab 01/26/20 1350 01/27/20 0829  AST 14* 12*  ALT 17 14  ALKPHOS 96 86  BILITOT 0.5 0.2*  PROT 7.8 6.7  ALBUMIN 3.7 2.9*   No results for input(s): LIPASE, AMYLASE in  the last 168 hours. No results for input(s): AMMONIA in the last 168 hours.  ABG    Component Value Date/Time   PHART 7.203 (L) 01/28/2020 1208   PCO2ART 108 (HH) 01/28/2020 1208   PO2ART 79.9 (L) 01/28/2020 1208   HCO3 31.5 (H) 01/28/2020 1208   O2SAT 92.8 01/28/2020 1208     Coagulation Profile: Recent Labs  Lab 01/26/20 1350 01/27/20 0829  INR 1.1 1.2    Cardiac Enzymes: No results for input(s): CKTOTAL, CKMB, CKMBINDEX, TROPONINI in the last 168 hours.  HbA1C: Hgb A1c MFr Bld  Date/Time Value Ref Range Status  01/26/2020 01:50 PM 5.8 (H) 4.8 - 5.6 % Final    Comment:    (NOTE) Pre diabetes:          5.7%-6.4%  Diabetes:              >6.4%  Glycemic control for   <7.0% adults with diabetes   04/12/2016 03:49 PM 5.6 4.8 - 5.6 % Final    Comment:    (NOTE)         Pre-diabetes: 5.7 - 6.4         Diabetes: >6.4         Glycemic control for adults with diabetes: <7.0     CBG: Recent Labs  Lab 01/26/20 2220 01/27/20 0805 01/27/20 2134 01/28/20 0726 01/28/20 1119  GLUCAP 175* 135* 147* 178* 129*       Past Medical History  She,  has a past medical history of Anxiety, Bipolar 1 disorder ([place]), Cancer ([place]), Chronic diarrhea, Complication of anesthesia, COPD (chronic obstructive pulmonary disease) ([place]), Depression, Diabetes mellitus ([place]), External hemorrhoids, GI bleed, Headache(784.0),  Hyperlipidemia, Hypertension, Hypothyroidism, Insomnia, Internal hemorrhoids, Polysubstance abuse ([place]) (Quit 2008 ), Shortness of breath, Suicidal thoughts, Tobacco abuse, and Tubular adenoma of colon.   Surgical History    Past Surgical History:  Procedure Laterality Date  . ABDOMINAL HYSTERECTOMY    . APPENDECTOMY    . COLON SURGERY     "colon repair"  . COLONOSCOPY WITH PROPOFOL N/A 12/11/2012   Procedure: COLONOSCOPY WITH PROPOFOL;  Surgeon: Jerene Bears, MD;  Location: [facility];  Service: Gastroenterology;  Laterality: N/A;  . FINGER SURGERY     middle left finger  . HERNIA REPAIR     x 2  . KNEE SURGERY Left    Left knee tendon surgery  . TONSILLECTOMY    . TUBAL LIGATION       Social History   reports that she has been smoking cigarettes. She has a 42.00 pack-year smoking history. She has never used smokeless tobacco. She reports current alcohol use. She reports that she does not use drugs.   Family History   Her family history includes Alcoholism in her father; Depression in her brother; Emphysema in her maternal grandfather; Multiple sclerosis in her mother. There is no history of Breast cancer, Colon cancer, Esophageal cancer, Colon polyps, Stomach cancer, Pancreatic cancer, or Liver disease.   Allergies Allergies  Allergen Reactions  . Bactrim [Sulfamethoxazole-Trimethoprim] Anaphylaxis  . Amoxicillin Hives  . Diclofenac Tinitus    swelling in feet  . Morphine And Related Nausea And Vomiting and Other (See Comments)    headache  . Potassium-Containing Compounds Itching     Home Medications  Prior to Admission medications   Medication Sig Start Date End Date Taking? Authorizing Provider  albuterol (PROVENTIL) (5 MG/ML) 0.5% nebulizer solution Take 2.5 mg by nebulization every 6 (six) hours as needed for wheezing  or shortness of breath. 04/16/12  Yes Bonnielee Haff, MD  amitriptyline (ELAVIL) 100 MG tablet Take 100 mg by mouth at bedtime.    Yes [provider]  amLODipine (NORVASC) 5 MG tablet Take 5 mg by mouth daily.   Yes [provider]  benztropine (COGENTIN) 0.5 MG tablet Take 0.5 mg by mouth 2 (two) times daily. 12/11/19  Yes [provider]  busPIRone (BUSPAR) 10 MG tablet Take 10 mg by mouth 3 (three) times daily.    Yes [provider]  divalproex (DEPAKOTE) 500 MG DR tablet Take 500 mg by mouth 2 (two) times daily.    Yes [provider]  famotidine (PEPCID) 20 MG tablet Take 1 tablet (20 mg total) by mouth 2 (two) times daily. 01/10/20  Yes Milton Ferguson, MD  QUEtiapine (SEROQUEL) 50 MG tablet Take 50 mg by mouth 3 (three) times daily.    Yes [provider]  sertraline (ZOLOFT) 100 MG tablet Take 200 mg by mouth daily.    Yes [provider]  SPIRIVA HANDIHALER 18 MCG inhalation capsule Place 1 capsule into inhaler and inhale daily.  12/17/19  Yes [provider]  metFORMIN (GLUCOPHAGE) 850 MG tablet Take 1 tablet by mouth in the morning and at bedtime. Patient not taking: Reported on 01/26/2020 04/27/17   [provider]       The patient is critically ill with multiple organ systems failure and requires high complexity decision making for assessment and support, frequent evaluation and titration of therapies, application of advanced monitoring technologies and extensive interpretation of multiple databases. Critical Care Time devoted to patient care services described in this note is 45 minutes.    Christinia Gully, MD [facility] [place] [phone]   After 7:00 pm call Elink  [phone]

## 2020-01-28 NOTE — Progress Notes (Signed)
Initial Nutrition Assessment  DOCUMENTATION CODES:   Obesity unspecified  INTERVENTION:  If pt unable to wean and is medically stable recommend:  Initiate Vital High Protein @ 40 ml/hr via OGT and increase by 10 ml every 6 hours to goal rate of 60 ml/hr.   Tube feeding regimen provides 1440 kcal, 125 grams of protein, and 1205 ml of H2O.    Sedative provides additional - 237 kcal daily   NUTRITION DIAGNOSIS:   Inadequate oral intake related to inability to eat as evidenced by NPO status.   GOAL:  Provide needs based on ASPEN/SCCM guidelines   MONITOR:  Vent status, Weight trends, TF tolerance, Skin, I & O's, Labs    REASON FOR ASSESSMENT:   Ventilator   ASSESSMENT:  Patient is currently intubated on ventilator support after failing BiPAP. She has a history of polysubstance abuse, COPD, HTN, DM2 and bipolar disorder. Presents Acute respiratory failure, sepsis, severe cellulitis RLE toe.  MV: 11.1 L/min Temp (24hrs), Avg:99.6 F (37.6 C), Min:97.9 F (36.6 C), Max:101.1 F (38.4 C)  Propofol: 8.97 ml/hr -provides 237 kcal lipids.     Intake/Output Summary (Last 24 hours) at 01/29/2020 0830 Last data filed at 01/29/2020 0546 Gross per 24 hour  Intake 666.79 ml  Output 1250 ml  Net -583.21 ml    Weight history reviewed.   Medications reviewed and include: depakote, colace, folvite, lasix, solumedrol, novolog, protonix, miralax, thiamine.  Drips: Cardizem  Versed  Labs: BMP Latest Ref Rng & Units 01/29/2020 01/28/2020 01/27/2020  Glucose 70 - 99 mg/dL - 171(H) 132(H)  BUN 8 - 23 mg/dL - 36(H) 40(H)  Creatinine 0.44 - 1.00 mg/dL 1.07(H) 0.61 0.75  Sodium 135 - 145 mmol/L - 142 140  Potassium 3.5 - 5.1 mmol/L - 3.7 4.3  Chloride 98 - 111 mmol/L - 99 103  CO2 22 - 32 mmol/L - 30 27  Calcium 8.9 - 10.3 mg/dL - 9.0 8.2(L)     NUTRITION - FOCUSED PHYSICAL EXAM:  Deferred until follow-up  Diet Order:   Diet Order            Diet NPO time specified  Diet  effective now                 EDUCATION NEEDS:  No education needs have been identified at this time   Skin:  Skin Assessment: Skin Integrity Issues: Skin Integrity Issues::  (2nd toe right foot cellutitis)  Last BM:  9/5  Height:   Ht Readings from Last 1 Encounters:  01/27/20 5\' 5"  (1.651 m)    Weight:   Wt Readings from Last 1 Encounters:  01/29/20 98.8 kg    Ideal Body Weight:   57 kg  BMI:  Body mass index is 36.25 kg/m.  Estimated Nutritional Needs:   Kcal:  1929  Protein:  120-130 gr  Fluid:  >1900 ml daily  Colman Cater MS,RD,CSG,LDN Pager: Shea Evans

## 2020-01-28 NOTE — Anesthesia Procedure Notes (Signed)
Procedure Name: Intubation Performed by: Louann Sjogren, MD Pre-anesthesia Checklist: Patient identified, Emergency Drugs available, Suction available, Patient being monitored and Timeout performed Patient Re-evaluated:Patient Re-evaluated prior to induction Oxygen Delivery Method: Ambu bag Induction Type: IV induction Ventilation: Mask ventilation without difficulty Laryngoscope Size: Miller and 4 Grade View: Grade III Tube type: Oral Tube size: 7.5 mm Number of attempts: 1 Airway Equipment and Method: Stylet Placement Confirmation: ETT inserted through vocal cords under direct vision,  positive ETCO2 and CO2 detector Secured at: 21 cm Tube secured with: Tape Dental Injury: Teeth and Oropharynx as per pre-operative assessment

## 2020-01-28 NOTE — Progress Notes (Signed)
PROGRESS NOTE    Laverle Pillard  QAS:[id number] DOB: 09-12-1955 DOA: 01/26/2020 PCP: Leonard Downing, MD    Chief Complaint  Patient presents with  . Toe Pain  . Code Sepsis    Brief Narrative:  As per H&P written by Dr. Manuella Ghazi on 01/26/2020 Tilley Faeth is a 64 y.o. female with medical history significant for COPD on 2L [place] at bedtime, hypertension, type 2 diabetes, obesity, bipolar disorder/anxiety disorder, ongoing tobacco use, questionable ongoing alcohol abuse, and prior polysubstance abuse with methamphetamine and alcohol who presented to the ED with progressive worsening of pain, erythema, and serosanguineous drainage from her right second toe.  This began to gradually develop after her daughter trimmed her nail too close to her skin approximately 2 weeks ago.  She has noticed some fever, chills, body aches, and mild headache as well as general malaise at home.  She tried soaking her foot at home with no improvement in her symptoms.  She denies any chest pain, shortness of breath, abdominal pain, nausea, vomiting, or dysuria.  She denies any upper respiratory symptoms as well.  She has not been vaccinated for COVID-19.   ED Course: Vital signs demonstrate ongoing sinus tachycardia and this is confirmed on EKG with heart rate approximately 110 bpm.  Temperature 1-1.5 Fahrenheit.  She is noted to have a leukocytosis of 12,900 as well as a thrombocytopenia of 129,000.  BUN is 38 and creatinine is elevated at 1.1 with baseline 0.3-0.5.  Chest x-ray with mild cardiomegaly and no other acute findings.  Covid testing negative.  Right foot x-ray with no soft tissue swelling or signs of osteomyelitis noted.  Lactic acid is 1.5.  Patient has been ordered and started 3 L fluid bolus as well as vancomycin and Rocephin for suspected sepsis secondary to cellulitis.  Assessment & Plan: 1-sepsis: In the setting of Streptococcus cellulitis/bacteremia.  Present on admission -Patient met sepsis  criteria on admission with sinus tachycardia, elevated white blood cells, febrile and with identified source of infection of cellulitic process.  She had acute kidney injury as organ dysfunction. -No signs of osteomyelitis on foot chest x-ray -Blood culture positive for Streptococcus -Initially started on vancomycin -Continue to follow ESR and CRP -Patient antibiotics adjusted to Ancef, will check Echo and repeat blood cultures. -Continue as needed antipyretics and supportive care. -Follow final culture results and sensitivity.  2-acute on chronic respiratory failure with hypoxia and hypercapnia -Appears to be a combination of COPD exacerbation, Underlying obesity hypoventilation syndrome and component of interstitial pulmonary edema/vascular congestion after fluid resuscitation as per sepsis protocol. -Continue Lasix -Continue treatment for COPD exacerbation -Patient failed the use of BiPAP and due to elevated CO2 and ongoing obtundation from hypercapnia will be intubated. -Ventilatory support will be initiated and PCCM has been consulted. -Follow clinical response.  3-acute kidney injury -After initial fluid resuscitation and improvement in overall sepsis physiology patient renal function is back to normal -Will continue to minimize nephrotoxic agents, avoid hypotension follow renal function trend.  4-mild thrombocytopenia -Likely related to acute sepsis and underlying history of alcohol abuse -No signs of overt bleeding -Avoid the use of heparin products and follow platelets count -Using SCDs for DVT prophylaxis.  5-alcohol abuse -Continue CIWA monitoring -Continue thiamine and folic acid.  6-class II obesity  -Body mass index is 36.58 kg/m. -Low calorie diet, portion control and increase physical activity discussed with patient.  7-history of hypertension -Blood pressure soft after initiation of sedation -Continue holding amlodipine for now -Continue treatment with  Lasix as  blood pressure tolerates. -Using Versed instead of propofol. -Follow vital signs.  8-history of tobacco abuse -Cessation counseling has been provided. -Continue nicotine patch.  9-depression/anxiety/history of seizures -Plan is to resume psychotropic home medications once able to take by mouth. -Continue Depakote  10-type 2 diabetes mellitus -A1c 5.8 -While inpatient continue sliding scale insulin -At discharge will resume the use of Metformin in order to continue assisting with management of insulin resistant and obesity. -Modified carbohydrate diet and lifestyle recommended after discharge.  DVT prophylaxis: SCD's Code Status: Full code Family Communication: daughter over the phone. 01/27/20 Disposition:   Status is: Inpatient  Dispo: The patient is from: Home              Anticipated d/c is to: To be determined              Anticipated d/c date is: to be determined.              Patient currently no medically stable for discharge, continue to demonstrate acute on chronic respiratory failure with hypoxia and hypercarbia; patient has failed the use of BiPAP and will require to be intubated.  ABG with CO2 of 102 and currently having obtunded mentation.  Still with low-grade temperature (even his fever curve has improved).  We will continue IV antibiotics, repeat blood cultures and check 2D echo.  Continue treatment with nebulizer and the steroids for COPD exacerbation and follow recommendations by PCCM.     Consultants:   PCCM   Procedures:  See below for x-ray reports.   Antimicrobials:  Ancef 01/27/20   Subjective: Obtunded, only responsive to painful stimuli; demonstrating respiratory distress with use of accessory muscles and severe hypercapnia by ABG.  Objective: Vitals:   01/28/20 0817 01/28/20 0900 01/28/20 1000 01/28/20 1118  BP: (!) 212/160 (!) 144/116    Pulse: (!) 153 (!) 146 (!) 145 (!) 142  Resp: (!) 26 (!) 35 (!) 32 (!) 35  Temp:    (!) 100.9 F (38.3  C)  TempSrc:    Rectal  SpO2: 95% 95% 93% 94%  Weight:      Height:        Intake/Output Summary (Last 24 hours) at 01/28/2020 1238 Last data filed at 01/28/2020 1000 Gross per 24 hour  Intake 950.11 ml  Output 3000 ml  Net -2049.89 ml   Filed Weights   01/26/20 2224 01/27/20 1102 01/28/20 0500  Weight: 100.4 kg 101.1 kg 99.7 kg    Examination: General exam: Currently not following commands, will retract to pain and is moaning.  Demonstrating respiratory distress and use of accessory muscles. Respiratory system: Positive rhonchi bilaterally; no wheezing appreciated at this time.  Fair air movement.  Using accessory muscles with visible tachypnea.  Fine crackles at the bases auscultated. Cardiovascular system: Sinus tachycardia, no rubs, no gallops, no murmurs.  Unable to assess JVD with body habitus. Gastrointestinal system: Abdomen is obese, nondistended, soft and nontender. No organomegaly or masses felt. Normal bowel sounds heard. Central nervous system: Alert and oriented. No focal neurological deficits. Extremities: No cyanosis or clubbing; no edema. Skin: Right lower extremity with cellulitic process in the anterior lateral aspect of her leg.  Second toe also with discoloration and swelling (improved). Psychiatry: Judgement and insight appear impaired in the setting of acute mental obtundation due to hypercapnia.    Data Reviewed: I have personally reviewed following labs and imaging studies  CBC: Recent Labs  Lab 01/26/20 1350 01/27/20 0829 01/28/20 0404  WBC 12.9* 10.2 12.3*  NEUTROABS 11.3*  --   --   HGB 15.3* 13.7 14.3  HCT 48.8* 46.5* 48.9*  MCV 92.4 96.3 96.6  PLT 129* 97* 103*    Basic Metabolic Panel: Recent Labs  Lab 01/26/20 1350 01/27/20 0829 01/28/20 0404  NA 137 140 142  K 4.1 4.3 3.7  CL 97* 103 99  CO2 _0 GLUCOSE 166* 132* 171*  BUN 38* 40* 36*  CREATININE 1.10* 0.75 0.61  CALCIUM 9.0 8.2* 9.0  MG  --  1.9 2.2    GFR: Estimated  Creatinine Clearance: 84.2 mL/min (by C-G formula based on SCr of 0.61 mg/dL).  Liver Function Tests: Recent Labs  Lab 01/26/20 1350 01/27/20 0829  AST 14* 12*  ALT 17 14  ALKPHOS 96 86  BILITOT 0.5 0.2*  PROT 7.8 6.7  ALBUMIN 3.7 2.9*    CBG: Recent Labs  Lab 01/26/20 2220 01/27/20 0805 01/27/20 2134 01/28/20 0726 01/28/20 1119  GLUCAP 175* 135* 147* 178* 129*     Recent Results (from the past 240 hour(s))  SARS Coronavirus 2 by RT PCR (hospital order, performed in [facility] hospital lab) Nasopharyngeal Nasopharyngeal Swab     Status: None   Collection Time: 01/26/20  1:17 PM   Specimen: Nasopharyngeal Swab  Result Value Ref Range Status   SARS Coronavirus 2 NEGATIVE NEGATIVE Final    Comment: (NOTE) SARS-CoV-2 target nucleic acids are NOT DETECTED.  The SARS-CoV-2 RNA is generally detectable in upper and lower respiratory specimens during the acute phase of infection. The lowest concentration of SARS-CoV-2 viral copies this assay can detect is 250 copies / mL. A negative result does not preclude SARS-CoV-2 infection and should not be used as the sole basis for treatment or other patient management decisions.  A negative result may occur with improper specimen collection / handling, submission of specimen other than nasopharyngeal swab, presence of viral mutation(s) within the areas targeted by this assay, and inadequate number of viral copies (<250 copies / mL). A negative result must be combined with clinical observations, patient history, and epidemiological information.  Fact Sheet for Patients:   StrictlyIdeas.no  Fact Sheet for Healthcare Providers: BankingDealers.co.za  This test is not yet approved or  cleared by the [place] FDA and has been authorized for detection and/or diagnosis of SARS-CoV-2 by FDA under an Emergency Use Authorization (EUA).  This EUA will remain in effect (meaning this test  can be used) for the duration of the COVID-19 declaration under Section 564(b)(1) of the Act, 21 U.S.C. section 360bbb-3(b)(1), unless the authorization is terminated or revoked sooner.  Performed at [facility], [address] [place], [place] [address]   Blood Culture (routine x 2)     Status: Abnormal (Preliminary result)   Collection Time: 01/26/20  1:50 PM   Specimen: Right Antecubital; Blood  Result Value Ref Range Status   Specimen Description   Final    RIGHT ANTECUBITAL BOTTLES DRAWN AEROBIC AND ANAEROBIC Performed at [facility], [address], [place], [place] [address]    Special Requests   Final    Blood Culture adequate volume Performed at [facility], [address] [place], [place] [address]    Culture  Setup Time   Final    GRAM POSITIVE COCCI Gram Stain Report Called to,Read Back By and Verified With: KILMER,T @ 3559 ON 01/27/20 BY JUW IN BOTH AEROBIC AND ANAEROBIC BOTTLES GS DONE @ [facility] Organism ID to follow  Culture (A)  Final    GROUP A STREP (S.PYOGENES) ISOLATED SUSCEPTIBILITIES TO FOLLOW Performed at [facility], [place] [address] [place], [place] [address]    Report Status PENDING  Incomplete  Blood Culture ID Panel (Reflexed)     Status: Abnormal   Collection Time: 01/26/20  1:50 PM  Result Value Ref Range Status   Enterococcus faecalis NOT DETECTED NOT DETECTED Final   Enterococcus Faecium NOT DETECTED NOT DETECTED Final   Listeria monocytogenes NOT DETECTED NOT DETECTED Final   Staphylococcus species NOT DETECTED NOT DETECTED Final   Staphylococcus aureus (BCID) NOT DETECTED NOT DETECTED Final   Staphylococcus epidermidis NOT DETECTED NOT DETECTED Final   Staphylococcus lugdunensis NOT DETECTED NOT DETECTED Final   Streptococcus species DETECTED (A) NOT DETECTED Final    Comment: CRITICAL RESULT CALLED TO, READ BACK BY AND VERIFIED WITH: G. Coffee PharmD 8:25 01/27/20 ([email or web address])    Streptococcus agalactiae NOT DETECTED NOT DETECTED Final     Streptococcus pneumoniae NOT DETECTED NOT DETECTED Final   Streptococcus pyogenes DETECTED (A) NOT DETECTED Final    Comment: CRITICAL RESULT CALLED TO, READ BACK BY AND VERIFIED WITH: G. Coffee PharmD 8:25 01/27/20 ([email or web address])    A.calcoaceticus-baumannii NOT DETECTED NOT DETECTED Final   Bacteroides fragilis NOT DETECTED NOT DETECTED Final   Enterobacterales NOT DETECTED NOT DETECTED Final   Enterobacter cloacae complex NOT DETECTED NOT DETECTED Final   Escherichia coli NOT DETECTED NOT DETECTED Final   Klebsiella aerogenes NOT DETECTED NOT DETECTED Final   Klebsiella oxytoca NOT DETECTED NOT DETECTED Final   Klebsiella pneumoniae NOT DETECTED NOT DETECTED Final   Proteus species NOT DETECTED NOT DETECTED Final   Salmonella species NOT DETECTED NOT DETECTED Final   Serratia marcescens NOT DETECTED NOT DETECTED Final   Haemophilus influenzae NOT DETECTED NOT DETECTED Final   Neisseria meningitidis NOT DETECTED NOT DETECTED Final   Pseudomonas aeruginosa NOT DETECTED NOT DETECTED Final   Stenotrophomonas maltophilia NOT DETECTED NOT DETECTED Final   Candida albicans NOT DETECTED NOT DETECTED Final   Candida auris NOT DETECTED NOT DETECTED Final   Candida glabrata NOT DETECTED NOT DETECTED Final   Candida krusei NOT DETECTED NOT DETECTED Final   Candida parapsilosis NOT DETECTED NOT DETECTED Final   Candida tropicalis NOT DETECTED NOT DETECTED Final   Cryptococcus neoformans/gattii NOT DETECTED NOT DETECTED Final    Comment: Performed at [facility], [address], [place], [place] [address]  Blood Culture (routine x 2)     Status: Abnormal (Preliminary result)   Collection Time: 01/26/20  2:00 PM   Specimen: Left Antecubital; Blood  Result Value Ref Range Status   Specimen Description   Final    LEFT ANTECUBITAL BOTTLES DRAWN AEROBIC AND ANAEROBIC Performed at [facility], [address] [place], [place] [address]    Special Requests   Final    Blood Culture adequate  volume Performed at [facility], [address] [place], [place] [address]    Culture  Setup Time   Final    GRAM POSITIVE COCCI Gram Stain Report Called to,Read Back By and Verified With: KILMER,T @ 0175 ON 01/27/20 BY JUW AEROBIC BOTTLE ONLY GS DONE @ [facility] Performed at [facility], [address], [place],  [address]    Culture GROUP A STREP (S.PYOGENES) ISOLATED (A)  Final   Report Status PENDING  Incomplete     Radiology Studies: DG CHEST PORT 1 VIEW  Result Date: 01/27/2020 CLINICAL DATA:  Shortness of breath.  EXAM: PORTABLE CHEST 1 VIEW COMPARISON:  January 26, 2020. FINDINGS: Stable cardiomegaly. No pneumothorax or pleural effusion is noted. Increased multiple ill-defined opacities are noted throughout both lungs concerning for multifocal pneumonia. Bony thorax is unremarkable. IMPRESSION: Increased multiple ill-defined opacities are noted throughout both lungs concerning for multifocal pneumonia. Electronically Signed   By: Marijo Conception M.D.   On: 01/27/2020 09:47   DG Chest Port 1 View  Result Date: 01/26/2020 CLINICAL DATA:  Possible sepsis.  Right second toe and leg pain. EXAM: PORTABLE CHEST 1 VIEW COMPARISON:  01/10/2020 FINDINGS: Cardiac silhouette is mildly enlarged. No mediastinal or hilar masses. No evidence of adenopathy. Lungs are hyperexpanded. Prominent bilateral interstitial markings are stable from the prior exam. No lung consolidation or evidence of edema. No pleural effusion or pneumothorax. Old healed left mid rib fracture. Skeletal structures are demineralized. IMPRESSION: 1. No acute cardiopulmonary disease. 2. Mild cardiomegaly. Electronically Signed   By: Lajean Manes M.D.   On: 01/26/2020 14:40   DG Foot Complete Right  Result Date: 01/26/2020 CLINICAL DATA:  Pain in the right second toe radiating to the right leg. Right second toe is discolored. Patient states swelling and discoloration began after having her toenails clipped. EXAM: RIGHT FOOT COMPLETE  - 3+ VIEW COMPARISON:  None. FINDINGS: No fracture or bone lesion. There is no bone resorption to suggest osteomyelitis. Joints are normally aligned. There is second toe soft tissue swelling.  No soft tissue air. IMPRESSION: 1. No fracture, dislocation or evidence of osteomyelitis. Electronically Signed   By: Lajean Manes M.D.   On: 01/26/2020 14:42    Scheduled Meds: . amitriptyline  100 mg Oral QHS  . arformoterol  15 mcg Nebulization BID  . benztropine  0.5 mg Oral BID  . budesonide (PULMICORT) nebulizer solution  0.5 mg Nebulization BID  . busPIRone  10 mg Oral TID  . Chlorhexidine Gluconate Cloth  6 each Topical Daily  . divalproex  500 mg Oral BID  . docusate  100 mg Oral BID  . folic acid  1 mg Oral Daily  . furosemide  40 mg Intravenous Q12H  . insulin aspart  0-5 Units Subcutaneous QHS  . insulin aspart  0-9 Units Subcutaneous TID WC  . methylPREDNISolone (SOLU-MEDROL) injection  40 mg Intravenous Q12H  . nicotine  7 mg Transdermal Daily  . pantoprazole (PROTONIX) IV  40 mg Intravenous Q24H  . polyethylene glycol  17 g Oral Daily  . thiamine  100 mg Oral Daily   Or  . thiamine  100 mg Intravenous Daily   Continuous Infusions: . sodium chloride Stopped (01/28/20 0640)  .  ceFAZolin (ANCEF) IV 200 mL/hr at 01/28/20 0649  . diltiazem (CARDIZEM) infusion 10 mg/hr (01/28/20 1130)  . propofol (DIPRIVAN) infusion       LOS: 2 days    Time spent: 40 minutes  Barton Dubois, MD [facility]   To contact the attending provider between 7A-7P or the covering provider during after hours 7P-7A, please log into the web site [email or web address] and access using universal [place] password for that web site. If you do not have the password, please call the hospital operator.  01/28/2020, 12:38 PM

## 2020-01-28 NOTE — Progress Notes (Signed)
Place on cooling blanket yesterday 01/27/20 about 1700 due to continually have fever over 102 even after tylenol. This morning temperature is still over 100 degrees F.

## 2020-01-29 ENCOUNTER — Inpatient Hospital Stay (HOSPITAL_COMMUNITY): Payer: Medicaid Other

## 2020-01-29 DIAGNOSIS — L03115 Cellulitis of right lower limb: Secondary | ICD-10-CM | POA: Diagnosis present

## 2020-01-29 DIAGNOSIS — J9601 Acute respiratory failure with hypoxia: Secondary | ICD-10-CM

## 2020-01-29 DIAGNOSIS — I517 Cardiomegaly: Secondary | ICD-10-CM

## 2020-01-29 DIAGNOSIS — J9602 Acute respiratory failure with hypercapnia: Secondary | ICD-10-CM

## 2020-01-29 LAB — BASIC METABOLIC PANEL
Anion gap: 13 (ref 5–15)
BUN: 63 mg/dL — ABNORMAL HIGH (ref 8–23)
CO2: 34 mmol/L — ABNORMAL HIGH (ref 22–32)
Calcium: 8.8 mg/dL — ABNORMAL LOW (ref 8.9–10.3)
Chloride: 96 mmol/L — ABNORMAL LOW (ref 98–111)
Creatinine, Ser: 1.03 mg/dL — ABNORMAL HIGH (ref 0.44–1.00)
GFR calc Af Amer: >60 mL/min (ref >60)
GFR calc non Af Amer: 58 mL/min — ABNORMAL LOW (ref >60)
Glucose, Bld: 152 mg/dL — ABNORMAL HIGH (ref 70–99)
Potassium: 3 mmol/L — ABNORMAL LOW (ref 3.5–5.1)
Sodium: 143 mmol/L (ref 135–145)

## 2020-01-29 LAB — CBC
HCT: 44.6 % (ref 36.0–46.0)
Hemoglobin: 13.4 g/dL (ref 12.0–15.0)
MCH: 28.3 pg (ref 26.0–34.0)
MCHC: 30 g/dL (ref 30.0–36.0)
MCV: 94.1 fL (ref 80.0–100.0)
Platelets: 103 10*3/uL — ABNORMAL LOW (ref 150–400)
RBC: 4.74 MIL/uL (ref 3.87–5.11)
RDW: 15.6 % — ABNORMAL HIGH (ref 11.5–15.5)
WBC: 12.6 10*3/uL — ABNORMAL HIGH (ref 4.0–10.5)
nRBC: 0 % (ref 0.0–0.2)

## 2020-01-29 LAB — CULTURE, BLOOD (ROUTINE X 2)
Special Requests: ADEQUATE
Special Requests: ADEQUATE

## 2020-01-29 LAB — GLUCOSE, CAPILLARY
Glucose-Capillary: 125 mg/dL — ABNORMAL HIGH (ref 70–99)
Glucose-Capillary: 140 mg/dL — ABNORMAL HIGH (ref 70–99)
Glucose-Capillary: 142 mg/dL — ABNORMAL HIGH (ref 70–99)
Glucose-Capillary: 150 mg/dL — ABNORMAL HIGH (ref 70–99)
Glucose-Capillary: 158 mg/dL — ABNORMAL HIGH (ref 70–99)
Glucose-Capillary: 159 mg/dL — ABNORMAL HIGH (ref 70–99)

## 2020-01-29 LAB — T4, FREE: Free T4: 0.75 ng/dL (ref 0.61–1.12)

## 2020-01-29 LAB — ECHOCARDIOGRAM COMPLETE
Area-P 1/2: 3.99 cm2
Height: 65 in
MV M vel: 4.56 m/s
MV Peak grad: 83.2 mmHg
S' Lateral: 2.08 cm
Weight: 3485.03 oz

## 2020-01-29 LAB — CREATININE, SERUM
Creatinine, Ser: 1.07 mg/dL — ABNORMAL HIGH (ref 0.44–1.00)
GFR calc Af Amer: >60 mL/min (ref >60)
GFR calc non Af Amer: 55 mL/min — ABNORMAL LOW (ref >60)

## 2020-01-29 LAB — MRSA PCR SCREENING: MRSA by PCR: NEGATIVE

## 2020-01-29 LAB — TSH: TSH: 0.294 u[IU]/mL — ABNORMAL LOW (ref 0.350–4.500)

## 2020-01-29 LAB — TRIGLYCERIDES: Triglycerides: 185 mg/dL — ABNORMAL HIGH (ref <150)

## 2020-01-29 LAB — MAGNESIUM
Magnesium: 2.3 mg/dL (ref 1.7–2.4)
Magnesium: 2.4 mg/dL (ref 1.7–2.4)

## 2020-01-29 LAB — PHOSPHORUS
Phosphorus: 2.3 mg/dL — ABNORMAL LOW (ref 2.5–4.6)
Phosphorus: 1.9 mg/dL — ABNORMAL LOW (ref 2.5–4.6)

## 2020-01-29 LAB — HEPARIN LEVEL (UNFRACTIONATED): Heparin Unfractionated: <0.1 IU/mL — ABNORMAL LOW (ref 0.30–0.70)

## 2020-01-29 MED ORDER — LABETALOL HCL 5 MG/ML IV SOLN
10.0000 mg | INTRAVENOUS | Status: DC | PRN
  Administered 2020-02-14 (×2): 10 mg via INTRAVENOUS
  Filled 2020-01-29 (×2): qty 4

## 2020-01-29 MED ORDER — HYDROCORTISONE NA SUCCINATE PF 100 MG IJ SOLR
50.0000 mg | Freq: Two times a day (BID) | INTRAMUSCULAR | Status: DC
  Administered 2020-01-30: 50 mg via INTRAVENOUS
  Filled 2020-01-29: qty 2

## 2020-01-29 MED ORDER — DILTIAZEM HCL 30 MG PO TABS
30.0000 mg | ORAL_TABLET | Freq: Once | ORAL | Status: AC
  Administered 2020-01-29: 30 mg via ORAL

## 2020-01-29 MED ORDER — HEPARIN (PORCINE) 25000 UT/250ML-% IV SOLN
2250.0000 [IU]/h | INTRAVENOUS | Status: AC
  Administered 2020-01-29: 1200 [IU]/h via INTRAVENOUS
  Administered 2020-01-30: 1450 [IU]/h via INTRAVENOUS
  Administered 2020-01-30: 1650 [IU]/h via INTRAVENOUS
  Administered 2020-01-31: 2100 [IU]/h via INTRAVENOUS
  Administered 2020-02-01 – 2020-02-05 (×10): 2250 [IU]/h via INTRAVENOUS
  Filled 2020-01-29 (×15): qty 250

## 2020-01-29 MED ORDER — VITAL HIGH PROTEIN PO LIQD
1000.0000 mL | ORAL | Status: DC
  Administered 2020-01-29 – 2020-02-04 (×10): 1000 mL

## 2020-01-29 MED ORDER — DILTIAZEM HCL 30 MG PO TABS
30.0000 mg | ORAL_TABLET | Freq: Four times a day (QID) | ORAL | Status: DC
  Administered 2020-01-29: 30 mg
  Filled 2020-01-29: qty 1

## 2020-01-29 MED ORDER — CLONIDINE HCL 0.1 MG PO TABS
0.1000 mg | ORAL_TABLET | Freq: Three times a day (TID) | ORAL | Status: DC
  Administered 2020-01-29 – 2020-01-31 (×6): 0.1 mg
  Filled 2020-01-29 (×7): qty 1

## 2020-01-29 MED ORDER — HEPARIN BOLUS VIA INFUSION
4000.0000 [IU] | Freq: Once | INTRAVENOUS | Status: AC
  Administered 2020-01-29: 4000 [IU] via INTRAVENOUS
  Filled 2020-01-29: qty 4000

## 2020-01-29 MED ORDER — LACTATED RINGERS IV SOLN: INTRAVENOUS | Status: DC

## 2020-01-29 MED ORDER — HEPARIN BOLUS VIA INFUSION
2400.0000 [IU] | Freq: Once | INTRAVENOUS | Status: AC
  Administered 2020-01-29: 2400 [IU] via INTRAVENOUS
  Filled 2020-01-29: qty 2400

## 2020-01-29 MED ORDER — DILTIAZEM HCL 60 MG PO TABS
60.0000 mg | ORAL_TABLET | Freq: Four times a day (QID) | ORAL | Status: DC
  Administered 2020-01-29 – 2020-02-13 (×43): 60 mg
  Filled 2020-01-29 (×46): qty 1

## 2020-01-29 MED ORDER — POTASSIUM CHLORIDE 10 MEQ/100ML IV SOLN
10.0000 meq | INTRAVENOUS | Status: AC
  Administered 2020-01-29 (×4): 10 meq via INTRAVENOUS
  Filled 2020-01-29: qty 100

## 2020-01-29 NOTE — Progress Notes (Signed)
Nutrition Follow-up  DOCUMENTATION CODES:   Obesity unspecified  INTERVENTION:  Initiate Vital High Protein @ 35 ml/hr via tube and increase by 10 ml every 6 hours to goal rate of 55 ml/hr (1320 ml/day)  Tube feeding regimen provides 1320 kcal, 116 grams of protein, and 1109 ml free water  Monitor magnesium, potassium, and phosphorus daily for at least 3 days, MD to replete as needed, as pt is at risk for refeeding syndrome given unknown nutrition history, questionable ongoing alcohol abuse, noted hypokalemia    NUTRITION DIAGNOSIS:   Inadequate oral intake related to inability to eat as evidenced by NPO status. -ongoing  GOAL:   Provide needs based on ASPEN/SCCM guidelines -meeting  MONITOR:   Vent status, Weight trends, TF tolerance, Skin, I & O's, Labs  REASON FOR ASSESSMENT:   Rounds, Ventilator, New TF    ASSESSMENT:   Patient is currently intubated on ventilator support after failing BiPAP. She has a history of polysubstance abuse, COPD, HTN, DM2 and bipolar disorder. Presents Acute respiratory failure, sepsis, severe cellulitis RLE toe.  9/7- intubated  Patient discussed in rounds, will start tube feedings today. CXR reviewed by PCCM on 9/07, per note consider advancing NG tube 4-5 cm to insure tip and side port well within stomach. Spoke with RN this morning, RN will order xray to confirm tube placement and advance if needed.    Patient is currently intubated on ventilator support MV: 10 L/min Temp (24hrs), Avg:99.6 F (37.6 C), Min:97.9 F (36.6 C), Max:101.1 F (38.4 C)  BP: 141/72 (cuff) MAP: 95 (cuff)   I/O: +1833.9 ml since admit UOP: 1250 ml x 24 hrs  Propofol: none   Medications reviewed and include: Colace, Miralax, Folic acid, Thiamine, Valproic acid, Lasix IV 40 mg every 12 hrs, SSI Drips: Ancef Cardizem Versed 0.5 mg/hr Labs: CBGs 159,142,140 x 24 hrs, K 3.0 (L), BUN 63 (H), Cr 1.03 (H), WBC 12.6 (H)   Diet Order:   Diet Order             Diet NPO time specified  Diet effective now                 EDUCATION NEEDS:   No education needs have been identified at this time  Skin:  Skin Assessment: Skin Integrity Issues: Skin Integrity Issues::  (2nd toe right foot cellutitis)  Last BM:  9/5  Height:   Ht Readings from Last 1 Encounters:  01/27/20 5\' 5"  (1.651 m)    Weight:   Wt Readings from Last 1 Encounters:  01/29/20 98.8 kg    Ideal Body Weight:  56.8 kg  BMI:  Body mass index is 36.25 kg/m.  Estimated Nutritional Needs:   Kcal:  4268-3419 (12-14 kcal/kg)  Protein:  114 (2g/kg/IBW)  Fluid:  >/= 1.2 L   Lajuan Lines, RD, LDN Clinical Nutrition After Hours/Weekend Pager # in [place]

## 2020-01-29 NOTE — Progress Notes (Signed)
Patient discussed with primary team, contacted regarding afib with RVR in setting of sepsis/bacteremia/respiratory failure on ventilator. Has already converted to SR on dilt drip, started on oral dilt 30mg  every 6 hours. Plenty of room with bp for diltiazem titration, will increase to 60mg  qid. Agree with IV hep in ICU setting, convert to [place] closer to discharge. Would be worth getting an outpatient monitor at follow up to see if recurrent afib outside this acute setting  Regarding request for TEE in setting of bacteremia. Discussed with primary team we would generally reserve TEEs on ICU/intubated patients to more urgent indications. We would postpone procedure until patient is extubated and more medically stable. TTE without signs of vegetations, no significant valvular dysfunction.   We will follow up telemetry tomorrow.   Carlyle Dolly MD

## 2020-01-29 NOTE — Progress Notes (Signed)
ANTICOAGULATION CONSULT NOTE - Initial Consult  Pharmacy Consult for heparin IV  Indication: atrial fibrillation  Allergies  Allergen Reactions  . Bactrim [Sulfamethoxazole-Trimethoprim] Anaphylaxis  . Amoxicillin Hives  . Diclofenac Tinitus    swelling in feet  . Morphine And Related Nausea And Vomiting and Other (See Comments)    headache  . Potassium-Containing Compounds Itching    Patient Measurements: Height: 5\' 5"  (165.1 cm) Weight: 98.8 kg (217 lb 13 oz) IBW/kg (Calculated) : 57 Heparin Dosing Weight: 80 kg  Vital Signs: Temp: 99.2 F (37.3 C) (09/08 1115) Temp Source: Axillary (09/08 1115) BP: 156/96 (09/08 1230) Pulse Rate: 92 (09/08 1230)  Labs: Recent Labs    01/26/20 1350 01/26/20 1350 01/27/20 0829 01/27/20 0829 01/28/20 0404 01/29/20 0614 01/29/20 0908  HGB 15.3*   < > 13.7   < > 14.3  --  13.4  HCT 48.8*   < > 46.5*  --  48.9*  --  44.6  PLT 129*   < > 97*  --  103*  --  103*  APTT 29  --   --   --   --   --   --   LABPROT 13.6  --  14.6  --   --   --   --   INR 1.1  --  1.2  --   --   --   --   CREATININE 1.10*   < > 0.75   < > 0.61 1.07* 1.03*   < > = values in this interval not displayed.    Estimated Creatinine Clearance: 65 mL/min (A) (by C-G formula based on SCr of 1.03 mg/dL (H)).   Medical History: Past Medical History:  Diagnosis Date  . Anxiety   . Bipolar 1 disorder ([place])   . Cancer ([place])    colon  . Chronic diarrhea    Followed by Dr Michail Sermon   . Complication of anesthesia    Hx resp distress during surgery  . COPD (chronic obstructive pulmonary disease) ([place])   . Depression   . Diabetes mellitus ([place])   . External hemorrhoids   . GI bleed   . Headache(784.0)   . Hyperlipidemia   . Hypertension   . Hypothyroidism   . Insomnia   . Internal hemorrhoids   . Polysubstance abuse ([place]) Quit 2008    H/O methamphetamine and alcohol abuse, clean x 6 years  . Shortness of breath   . Suicidal thoughts   . Tobacco abuse   .  Tubular adenoma of colon     Medications:  Medications Prior to Admission  Medication Sig Dispense Refill Last Dose  . albuterol (PROVENTIL) (5 MG/ML) 0.5% nebulizer solution Take 2.5 mg by nebulization every 6 (six) hours as needed for wheezing or shortness of breath.   01/26/2020 at Unknown time  . amitriptyline (ELAVIL) 100 MG tablet Take 100 mg by mouth at bedtime.    01/25/2020 at Unknown time  . amLODipine (NORVASC) 5 MG tablet Take 5 mg by mouth daily.   01/26/2020 at Unknown time  . benztropine (COGENTIN) 0.5 MG tablet Take 0.5 mg by mouth 2 (two) times daily.   01/26/2020 at 0900  . busPIRone (BUSPAR) 10 MG tablet Take 10 mg by mouth 3 (three) times daily.    01/26/2020 at 0900  . divalproex (DEPAKOTE) 500 MG DR tablet Take 500 mg by mouth 2 (two) times daily.    01/26/2020 at 0900  . famotidine (PEPCID) 20 MG tablet Take 1 tablet (  20 mg total) by mouth 2 (two) times daily. 10 tablet 0 01/26/2020 at Unknown time  . QUEtiapine (SEROQUEL) 50 MG tablet Take 50 mg by mouth 3 (three) times daily.    01/26/2020 at 0900  . sertraline (ZOLOFT) 100 MG tablet Take 200 mg by mouth daily.    01/26/2020 at 0900  . SPIRIVA HANDIHALER 18 MCG inhalation capsule Place 1 capsule into inhaler and inhale daily.    01/26/2020 at Unknown time  . metFORMIN (GLUCOPHAGE) 850 MG tablet Take 1 tablet by mouth in the morning and at bedtime. (Patient not taking: Reported on 01/26/2020)   Not Taking at Unknown time    Assessment: Pharmacy consulted to dose heparin in patient with atrial fibrillation.  Patient is not on anticoagulation prior to admission.  Goal of Therapy:  Heparin level 0.3-0.7 units/ml Monitor platelets by anticoagulation protocol: Yes   Plan:  Give 4000 units bolus x 1  Start heparin infusion at 1200 units/hr Check anti-Xa level in 6 hours and daily. Continue to monitor H&H and platelets.   Margot Ables, PharmD Clinical Pharmacist 01/29/2020 1:02 PM

## 2020-01-29 NOTE — Progress Notes (Signed)
PROGRESS NOTE    [name] [name]  DJM:[id number] DOB: 18-Oct-1955 DOA: 01/26/2020 PCP: Leonard Downing, MD   Brief Narrative:  As per H&P written by Dr. Manuella Ghazi on 01/26/2020 [name] [name] a 64 y.o.femalewith medical history significant forCOPD on 2L [place] at bedtime, hypertension, type 2 diabetes, obesity, bipolar disorder/anxiety disorder, ongoing tobacco use,questionable ongoing alcohol abuse,and prior polysubstance abuse with methamphetamine and alcohol who presented to the ED with progressive worsening of pain, erythema, and serosanguineous drainage from her right second toe. This began to gradually develop after her daughter trimmed her nail too close to her skin approximately 2 weeks ago. She has noticed some fever, chills, body aches, and mild headache as well as general malaise at home. She tried soaking her foot at home with no improvement in her symptoms. She denies any chest pain, shortness of breath, abdominal pain, nausea, vomiting, or dysuria. She denies any upper respiratory symptoms as well. She has not been vaccinated for COVID-19.  ED Course:Vital signs demonstrate ongoing sinus tachycardia and this is confirmed on EKG with heart rate approximately 110 bpm. Temperature 1-1.5 Fahrenheit. She is noted to have a leukocytosis of 12,900 as well as a thrombocytopenia of 129,000. BUN is 38 and creatinine is elevated at 1.1 with baseline 0.3-0.5. Chest x-ray with mild cardiomegaly and no other acute findings. Covid testing negative. Right foot x-ray with no soft tissue swelling or signs of osteomyelitis noted. Lactic acid is 1.5. Patient has been ordered and started 3 L fluid bolus as well as vancomycin and Rocephin for suspected sepsis secondary to cellulitis.  9/8: Patient remains comfortable on ventilator and is noted to be in sinus rhythm this morning.  Plan to discontinue IV Cardizem drip and transition to oral as ordered.  2D echocardiogram with LVEF 55-60%  with no vegetations noted.  She is being treated with Ancef for group A strep bacteremia related to cellulitis.  Plan to consult cardiology today for planning of TEE.  Start tube feedings as well.  Appreciate further PCCM recommendations.  Assessment & Plan:   Active Problems:   Acute respiratory failure with hypoxia and hypercapnia (HCC)   Severe sepsis (HCC)   1-sepsis: In the setting of Streptococcus cellulitis/bacteremia.  Present on admission -Patient met sepsis criteria on admission with sinus tachycardia, elevated white blood cells, febrile and with identified source of infection of cellulitic process.  She had acute kidney injury as organ dysfunction. -No signs of osteomyelitis on foot chest x-ray -Blood culture positive for Streptococcus -Initially started on vancomycin which has been DC since MRSA negative -Continue to follow ESR and CRP -Continue on Ancef based on sensitivities -Transthoracic echocardiogram with LVEF 55 to 60% and no vegetations noted, appreciate cardiology consultation for TEE -Continue as needed antipyretics and supportive care.  2-acute on chronic respiratory failure with hypoxia and hypercapnia s/p intubation -Appears to be a combination of COPD exacerbation, Underlying obesity hypoventilation syndrome and component of interstitial pulmonary edema/vascular congestion after fluid resuscitation as per sepsis protocol. -Hold further lasix -PCCM following  3-acute kidney injury -Baseline creatinine 0.6-0.7, currently around 1 -Hold further Lasix and administer gentle fluid resuscitation  4-mild thrombocytopenia -Likely related to acute sepsis and underlying history of alcohol abuse -No signs of overt bleeding -Monitor carefully while on heparin drip -Repeat CBC in a.m.  5-alcohol abuse -Continue CIWA monitoring -Continue thiamine and folic acid.  6-class II obesity  -Body mass index is 36.58 kg/m. -Low calorie diet, portion control and  increase physical activity discussed with patient.  7-history  of hypertension -Holding home amlodipine for now -Hold further Lasix -Wean off hydrocortisone given elevated blood pressure readings -Cardizem IV to per tube -Labetalol as needed  8-history of tobacco abuse -Cessation counseling has been provided. -Continue nicotine patch.  9-depression/anxiety/history of seizures -Plan is to resume psychotropic home medications once able to take by mouth. -Continue Depakote  10-type 2 diabetes mellitus -A1c 5.8 -While inpatient continue sliding scale insulin -At discharge will resume the use of Metformin in order to continue assisting with management of insulin resistant and obesity. -Modified carbohydrate diet and lifestyle recommended after discharge.  11-new onset atrial fibrillation with RVR -Currently in sinus rhythm -Plan to discontinue IV Cardizem and transition to oral -TSH 0.294, check free T4 and T3 -2D echocardiogram with LVEF 55-60% -CHA2DS2-VASc score of 3 for which I will start heparin drip -Appreciate further cardiology recommendations  12-Hypokalemia -Replete IV -Recheck in am -Hold further Lasix  DVT prophylaxis:Heparin drip Code Status: Full Family Communication: Discussed with daughter at bedside. Disposition Plan:  Status is: Inpatient  Remains inpatient appropriate because:Ongoing diagnostic testing needed not appropriate for outpatient work up, IV treatments appropriate due to intensity of illness or inability to take PO and Inpatient level of care appropriate due to severity of illness   Dispo: The patient is from: Home              Anticipated d/c is to: SNF              Anticipated d/c date is: > 3 days              Patient currently is not medically stable to d/c.  Patient currently remains intubated on the ventilator and will need further evaluation per cardiology along with ongoing evaluation per PCCM.  Consultants:   PCCM  Procedures:     2D echocardiogram 9/8 with LVEF 55-60%.  No vegetations.  Antimicrobials:  Anti-infectives (From admission, onward)   Start     Dose/Rate Route Frequency Ordered Stop   01/27/20 1500  vancomycin (VANCOREADY) IVPB 1500 mg/300 mL  Status:  Discontinued        1,500 mg 150 mL/hr over 120 Minutes Intravenous Every 24 hours 01/26/20 1428 01/27/20 0827   01/27/20 0830  ceFAZolin (ANCEF) IVPB 2g/100 mL premix        2 g 200 mL/hr over 30 Minutes Intravenous Every 8 hours 01/27/20 0827     01/26/20 1545  vancomycin (VANCOCIN) IVPB 1000 mg/200 mL premix  Status:  Discontinued        1,000 mg 200 mL/hr over 60 Minutes Intravenous  Once 01/26/20 1541 01/26/20 1553   01/26/20 1415  vancomycin (VANCOCIN) IVPB 1000 mg/200 mL premix  Status:  Discontinued        1,000 mg 200 mL/hr over 60 Minutes Intravenous  Once 01/26/20 1408 01/26/20 1413   01/26/20 1415  cefTRIAXone (ROCEPHIN) 2 g in sodium chloride 0.9 % 100 mL IVPB        2 g 200 mL/hr over 30 Minutes Intravenous  Once 01/26/20 1408 01/26/20 2011   01/26/20 1415  vancomycin (VANCOREADY) IVPB 2000 mg/400 mL        2,000 mg 200 mL/hr over 120 Minutes Intravenous  Once 01/26/20 1413 01/26/20 2011      Subjective: Patient seen and evaluated today and appears to be in sinus rhythm with good rate control noted while on Cardizem drip.  No other acute overnight events otherwise noted.  She remains sedated on the ventilator.  Objective: Vitals:  01/29/20 1147 01/29/20 1200 01/29/20 1215 01/29/20 1230  BP:  (!) 156/86 (!) 153/90 (!) 156/96  Pulse:  93 92 92  Resp:  (!) 22 (!) 21 (!) 24  Temp:      TempSrc:      SpO2: 99% 99% 98% 98%  Weight:      Height:        Intake/Output Summary (Last 24 hours) at 01/29/2020 1247 Last data filed at 01/29/2020 1213 Gross per 24 hour  Intake 975.25 ml  Output 1250 ml  Net -274.75 ml   Filed Weights   01/27/20 1102 01/28/20 0500 01/29/20 0400  Weight: 101.1 kg 99.7 kg 98.8 kg     Examination:  General exam: Appears calm and comfortable, sedated on ventilator Respiratory system: Clear to auscultation. Respiratory effort normal.  Intubated with FiO2 60%. Cardiovascular system: S1 & S2 heard, RRR.  Gastrointestinal system: Abdomen is nondistended, soft and nontender.  Central nervous system: Sedated Extremities: No significant edema.  Skin changes especially with cellulitic changes to right shin and right second toe. Psychiatry: Cannot be assessed given patient condition.    Data Reviewed: I have personally reviewed following labs and imaging studies  CBC: Recent Labs  Lab 01/26/20 1350 01/27/20 0829 01/28/20 0404 01/29/20 0908  WBC 12.9* 10.2 12.3* 12.6*  NEUTROABS 11.3*  --   --   --   HGB 15.3* 13.7 14.3 13.4  HCT 48.8* 46.5* 48.9* 44.6  MCV 92.4 96.3 96.6 94.1  PLT 129* 97* 103* 121*   Basic Metabolic Panel: Recent Labs  Lab 01/26/20 1350 01/27/20 0829 01/28/20 0404 01/29/20 0614 01/29/20 0908  NA 137 140 142  --  143  K 4.1 4.3 3.7  --  3.0*  CL 97* 103 99  --  96*  CO2 _0 --  34*  GLUCOSE 166* 132* 171*  --  152*  BUN 38* 40* 36*  --  63*  CREATININE 1.10* 0.75 0.61 1.07* 1.03*  CALCIUM 9.0 8.2* 9.0  --  8.8*  MG  --  1.9 2.2  --  2.3  PHOS  --   --   --   --  2.3*   GFR: Estimated Creatinine Clearance: 65 mL/min (A) (by C-G formula based on SCr of 1.03 mg/dL (H)). Liver Function Tests: Recent Labs  Lab 01/26/20 1350 01/27/20 0829  AST 14* 12*  ALT 17 14  ALKPHOS 96 86  BILITOT 0.5 0.2*  PROT 7.8 6.7  ALBUMIN 3.7 2.9*   No results for input(s): LIPASE, AMYLASE in the last 168 hours. No results for input(s): AMMONIA in the last 168 hours. Coagulation Profile: Recent Labs  Lab 01/26/20 1350 01/27/20 0829  INR 1.1 1.2   Cardiac Enzymes: No results for input(s): CKTOTAL, CKMB, CKMBINDEX, TROPONINI in the last 168 hours. BNP (last 3 results) No results for input(s): PROBNP in the last 8760  hours. HbA1C: Recent Labs    01/26/20 1350  HGBA1C 5.8*   CBG: Recent Labs  Lab 01/28/20 1947 01/29/20 0013 01/29/20 0518 01/29/20 0712 01/29/20 1132  GLUCAP 134* 140* 142* 159* 125*   Lipid Profile: Recent Labs    01/29/20 0614  TRIG 185*   Thyroid Function Tests: Recent Labs    01/29/20 0908  TSH 0.294*   Anemia Panel: No results for input(s): VITAMINB12, FOLATE, FERRITIN, TIBC, IRON, RETICCTPCT in the last 72 hours. Sepsis Labs: Recent Labs  Lab 01/26/20 1350 01/26/20 1611 01/27/20 0829  PROCALCITON  --   --  3.34  LATICACIDVEN 1.5 0.8 0.8    Recent Results (from the past 240 hour(s))  SARS Coronavirus 2 by RT PCR (hospital order, performed in [facility] hospital lab) Nasopharyngeal Nasopharyngeal Swab     Status: None   Collection Time: 01/26/20  1:17 PM   Specimen: Nasopharyngeal Swab  Result Value Ref Range Status   SARS Coronavirus 2 NEGATIVE NEGATIVE Final    Comment: (NOTE) SARS-CoV-2 target nucleic acids are NOT DETECTED.  The SARS-CoV-2 RNA is generally detectable in upper and lower respiratory specimens during the acute phase of infection. The lowest concentration of SARS-CoV-2 viral copies this assay can detect is 250 copies / mL. A negative result does not preclude SARS-CoV-2 infection and should not be used as the sole basis for treatment or other patient management decisions.  A negative result may occur with improper specimen collection / handling, submission of specimen other than nasopharyngeal swab, presence of viral mutation(s) within the areas targeted by this assay, and inadequate number of viral copies (<250 copies / mL). A negative result must be combined with clinical observations, patient history, and epidemiological information.  Fact Sheet for Patients:   StrictlyIdeas.no  Fact Sheet for Healthcare Providers: BankingDealers.co.za  This test is not yet approved or  cleared by  the [place] FDA and has been authorized for detection and/or diagnosis of SARS-CoV-2 by FDA under an Emergency Use Authorization (EUA).  This EUA will remain in effect (meaning this test can be used) for the duration of the COVID-19 declaration under Section 564(b)(1) of the Act, 21 U.S.C. section 360bbb-3(b)(1), unless the authorization is terminated or revoked sooner.  Performed at [facility], [address], [place], [place] [address]   Blood Culture (routine x 2)     Status: Abnormal   Collection Time: 01/26/20  1:50 PM   Specimen: Right Antecubital; Blood  Result Value Ref Range Status   Specimen Description   Final    RIGHT ANTECUBITAL BOTTLES DRAWN AEROBIC AND ANAEROBIC Performed at [facility], [address] [place], [place] [address]    Special Requests   Final    Blood Culture adequate volume Performed at [facility], [address] [place], [place] [address]    Culture  Setup Time   Final    GRAM POSITIVE COCCI Gram Stain Report Called to,Read Back By and Verified With: KILMER,T @ 7026 ON 01/27/20 BY JUW IN BOTH AEROBIC AND ANAEROBIC BOTTLES GS DONE @ [facility] Organism ID to follow Performed at [facility], [place] [address], [place], [place] [address]    Culture GROUP A STREP (S.PYOGENES) ISOLATED (A)  Final   Report Status 01/29/2020 FINAL  Final   Organism ID, Bacteria GROUP A STREP (S.PYOGENES) ISOLATED  Final      Susceptibility   Group a strep (s.pyogenes) isolated - MIC*    PENICILLIN <=0.06 SENSITIVE Sensitive     CEFTRIAXONE <=0.12 SENSITIVE Sensitive     ERYTHROMYCIN >=8 RESISTANT Resistant     LEVOFLOXACIN 0.5 SENSITIVE Sensitive     VANCOMYCIN <=0.12 SENSITIVE Sensitive     * GROUP A STREP (S.PYOGENES) ISOLATED  Blood Culture ID Panel (Reflexed)     Status: Abnormal   Collection Time: 01/26/20  1:50 PM  Result Value Ref Range Status   Enterococcus faecalis NOT DETECTED NOT DETECTED Final   Enterococcus Faecium NOT DETECTED NOT DETECTED Final    Listeria monocytogenes NOT DETECTED NOT DETECTED Final   Staphylococcus species NOT DETECTED NOT DETECTED Final   Staphylococcus  aureus (BCID) NOT DETECTED NOT DETECTED Final   Staphylococcus epidermidis NOT DETECTED NOT DETECTED Final   Staphylococcus lugdunensis NOT DETECTED NOT DETECTED Final   Streptococcus species DETECTED (A) NOT DETECTED Final    Comment: CRITICAL RESULT CALLED TO, READ BACK BY AND VERIFIED WITH: G. Coffee PharmD 8:25 01/27/20 ([email or web address])    Streptococcus agalactiae NOT DETECTED NOT DETECTED Final   Streptococcus pneumoniae NOT DETECTED NOT DETECTED Final   Streptococcus pyogenes DETECTED (A) NOT DETECTED Final    Comment: CRITICAL RESULT CALLED TO, READ BACK BY AND VERIFIED WITH: G. Coffee PharmD 8:25 01/27/20 ([email or web address])    A.calcoaceticus-baumannii NOT DETECTED NOT DETECTED Final   Bacteroides fragilis NOT DETECTED NOT DETECTED Final   Enterobacterales NOT DETECTED NOT DETECTED Final   Enterobacter cloacae complex NOT DETECTED NOT DETECTED Final   Escherichia coli NOT DETECTED NOT DETECTED Final   Klebsiella aerogenes NOT DETECTED NOT DETECTED Final   Klebsiella oxytoca NOT DETECTED NOT DETECTED Final   Klebsiella pneumoniae NOT DETECTED NOT DETECTED Final   Proteus species NOT DETECTED NOT DETECTED Final   Salmonella species NOT DETECTED NOT DETECTED Final   Serratia marcescens NOT DETECTED NOT DETECTED Final   Haemophilus influenzae NOT DETECTED NOT DETECTED Final   Neisseria meningitidis NOT DETECTED NOT DETECTED Final   Pseudomonas aeruginosa NOT DETECTED NOT DETECTED Final   Stenotrophomonas maltophilia NOT DETECTED NOT DETECTED Final   Candida albicans NOT DETECTED NOT DETECTED Final   Candida auris NOT DETECTED NOT DETECTED Final   Candida glabrata NOT DETECTED NOT DETECTED Final   Candida krusei NOT DETECTED NOT DETECTED Final   Candida parapsilosis NOT DETECTED NOT DETECTED Final   Candida tropicalis NOT DETECTED NOT DETECTED Final   Cryptococcus  neoformans/gattii NOT DETECTED NOT DETECTED Final    Comment: Performed at [facility], [address], [place], [place] [address]  Blood Culture (routine x 2)     Status: Abnormal   Collection Time: 01/26/20  2:00 PM   Specimen: Left Antecubital; Blood  Result Value Ref Range Status   Specimen Description   Final    LEFT ANTECUBITAL BOTTLES DRAWN AEROBIC AND ANAEROBIC Performed at [facility], [address] [place], [place] [address]    Special Requests   Final    Blood Culture adequate volume Performed at [facility], [address], [place], [place] [address]    Culture  Setup Time   Final    GRAM POSITIVE COCCI Gram Stain Report Called to,Read Back By and Verified With: KILMER,T @ 9390 ON 01/27/20 BY JUW AEROBIC BOTTLE ONLY GS DONE @ [facility] Performed at [facility], [address] [place], [place] [address]    Culture (A)  Final    GROUP A STREP (S.PYOGENES) ISOLATED SUSCEPTIBILITIES PERFORMED ON PREVIOUS CULTURE WITHIN THE LAST 5 DAYS. Performed at [facility], [place] [address], [place], [place] [address]    Report Status 01/29/2020 FINAL  Final  Culture, blood (Routine X 2) w Reflex to ID Panel     Status: None (Preliminary result)   Collection Time: 01/28/20  1:01 PM   Specimen: BLOOD RIGHT HAND  Result Value Ref Range Status   Specimen Description BLOOD RIGHT HAND  Final   Special Requests   Final    BOTTLES DRAWN AEROBIC ONLY Blood Culture results may not be optimal due to an inadequate volume of blood received in culture bottles   Culture   Final    NO GROWTH < 24 HOURS Performed at [facility], [address], [place], [place] [address]    Report Status PENDING  Incomplete  Culture, blood (Routine X 2) w Reflex to ID Panel     Status: None (Preliminary result)   Collection Time: 01/28/20  1:01 PM   Specimen: BLOOD RIGHT WRIST  Result Value Ref Range Status   Specimen Description BLOOD RIGHT WRIST  Final   Special Requests   Final    BOTTLES DRAWN  AEROBIC AND ANAEROBIC Blood Culture adequate volume   Culture   Final    NO GROWTH < 24 HOURS Performed at [facility], [address] [place], [place] [address]    Report Status PENDING  Incomplete         Radiology Studies: DG CHEST PORT 1 VIEW  Result Date: 01/29/2020 CLINICAL DATA:  Nasogastric tube placement EXAM: PORTABLE CHEST 1 VIEW COMPARISON:  Yesterday FINDINGS: Nasogastric tube tip reaches the stomach at least. The side port is not visualized on this study. The endotracheal tube tip is at the clavicular heads. Patchy infiltrate at the left base. Interstitial coarsening on both sides. Borderline heart size, stable. No visible effusion or pneumothorax. IMPRESSION: 1. Unremarkable hardware positioning. 2. Left base infiltrate. Electronically Signed   By: Monte Fantasia M.D.   On: 01/29/2020 11:41   DG CHEST PORT 1 VIEW  Result Date: 01/28/2020 CLINICAL DATA:  Hypoxia EXAM: PORTABLE CHEST 1 VIEW COMPARISON:  January 27, 2020 and January 10, 2020; May 27, 2018 FINDINGS: Endotracheal tube tip is 7.7 cm above the carina. Nasogastric tube tip is in the proximal stomach with the side port at the gastroesophageal junction. No pneumothorax. There is interstitial thickening throughout the lungs bilaterally, likely due to a degree of underlying fibrosis. There is no frank edema or airspace opacity. Heart is borderline enlarged with pulmonary vascularity normal. No adenopathy. No bone lesions. IMPRESSION: Tube positions as described without pneumothorax. It may be prudent to consider advancing nasogastric tube 4-5 cm to insure that both the tip and side port are well within the stomach. Underlying apparent interstitial fibrosis. No frank edema or airspace opacity. A degree of interstitial edema superimposed on fibrosis is difficult to exclude in this circumstance. No consolidation. Stable cardiac prominence.  No adenopathy demonstrable. Electronically Signed   By: Lowella Grip III M.D.   On:  01/28/2020 13:22   ECHOCARDIOGRAM COMPLETE  Result Date: 01/29/2020    ECHOCARDIOGRAM REPORT   Patient Name:   [name] [name] Date of Exam: 01/29/2020 Medical Rec #:  [id number]         Height:       65.0 in Accession #:    [id number]        Weight:       217.8 lb Date of Birth:  1955-05-31         BSA:          2.051 m Patient Age:    38 years          BP:           126/66 mmHg Patient Gender: F                 HR:           88 bpm. Exam Location:  [facility] Procedure: 2D Echo Indications:    Cardiomegaly 429.3 / I51.7,  History:        Patient has no prior history of Echocardiogram examinations.  COPD; Risk Factors:Current Smoker, Dyslipidemia, Hypertension                 and Diabetes. Severe Sepsis, ETOH, Bipolar, sepsis/ gm pos                 bacteremia.  Sonographer:    Leavy Cella RDCS (AE) Referring Phys: [place]  1. Left ventricular ejection fraction, by estimation, is 55 to 60%. The left ventricle has normal function. The left ventricle has no regional wall motion abnormalities. There is mild left ventricular hypertrophy. Left ventricular diastolic parameters are indeterminate.  2. Right ventricular systolic function is normal. The right ventricular size is normal. Tricuspid regurgitation signal is inadequate for assessing PA pressure.  3. Left atrial size was mildly dilated.  4. The mitral valve is abnormal, mildly calcified. Trivial mitral valve regurgitation. Moderate mitral annular calcification.  5. Tricuspid valve is mildly calcified with trivial tricuspid regurgitation.  6. The aortic valve is tricuspid. Aortic valve regurgitation is not visualized. Mild to moderate aortic valve sclerosis/calcification is present, without any evidence of aortic stenosis.  7. The inferior vena cava is normal in size with <50% respiratory variability, suggesting right atrial pressure of 8 mmHg.  8. No definite valvular vegetations. FINDINGS  Left Ventricle: Left  ventricular ejection fraction, by estimation, is 55 to 60%. The left ventricle has normal function. The left ventricle has no regional wall motion abnormalities. The left ventricular internal cavity size was normal in size. There is  mild left ventricular hypertrophy. Left ventricular diastolic parameters are indeterminate. Right Ventricle: The right ventricular size is normal. No increase in right ventricular wall thickness. Right ventricular systolic function is normal. Tricuspid regurgitation signal is inadequate for assessing PA pressure. Left Atrium: Left atrial size was mildly dilated. Right Atrium: Right atrial size was normal in size. Pericardium: There is no evidence of pericardial effusion. Mitral Valve: The mitral valve is abnormal. There is mild thickening of the mitral valve leaflet(s). Moderate mitral annular calcification. Trivial mitral valve regurgitation. Tricuspid Valve: Tricuspid valve is mildly calcified with trivial tricuspid regurgitation. The tricuspid valve is grossly normal. Tricuspid valve regurgitation is trivial. Aortic Valve: The aortic valve is tricuspid. There is mild to moderate aortic valve annular calcification. Aortic valve regurgitation is not visualized. Mild to moderate aortic valve sclerosis/calcification is present, without any evidence of aortic stenosis. Pulmonic Valve: The pulmonic valve was grossly normal. Pulmonic valve regurgitation is trivial. Aorta: The aortic root is normal in size and structure. Venous: The inferior vena cava is normal in size with less than 50% respiratory variability, suggesting right atrial pressure of 8 mmHg. IAS/Shunts: No atrial level shunt detected by color flow Doppler.  LEFT VENTRICLE PLAX 2D LVIDd:         3.71 cm  Diastology LVIDs:         2.08 cm  LV e' medial:    4.87 cm/s LV PW:         1.28 cm  LV E/e' medial:  22.2 LV IVS:        1.23 cm  LV e' lateral:   4.95 cm/s LVOT diam:     2.00 cm  LV E/e' lateral: 21.8 LVOT Area:     3.14 cm   RIGHT VENTRICLE RV S prime:     11.70 cm/s TAPSE (M-mode): 2.7 cm LEFT ATRIUM             Index       RIGHT ATRIUM  Index LA diam:        3.70 cm 1.80 cm/m  RA Area:     20.40 cm LA Vol (A2C):   72.8 ml 35.49 ml/m RA Volume:   66.90 ml  32.62 ml/m LA Vol (A4C):   76.6 ml 37.34 ml/m LA Biplane Vol: 76.9 ml 37.49 ml/m   AORTA Ao Root diam: 2.80 cm MITRAL VALVE MV Area (PHT): 3.99 cm     SHUNTS MV Decel Time: 190 msec     Systemic Diam: 2.00 cm MR Peak grad: 83.2 mmHg MR Vmax:      456.00 cm/s MV E velocity: 108.00 cm/s MV A velocity: 124.00 cm/s MV E/A ratio:  0.87 Rozann Lesches MD Electronically signed by Rozann Lesches MD Signature Date/Time: 01/29/2020/10:08:48 AM    Final         Scheduled Meds: . arformoterol  15 mcg Nebulization BID  . benztropine  0.5 mg Per Tube BID  . budesonide (PULMICORT) nebulizer solution  0.5 mg Nebulization BID  . chlorhexidine gluconate (MEDLINE KIT)  15 mL Mouth Rinse BID  . Chlorhexidine Gluconate Cloth  6 each Topical Daily  . diltiazem  30 mg Per Tube Q6H  . docusate  100 mg Per Tube BID  . feeding supplement (VITAL HIGH PROTEIN)  1,000 mL Per Tube Q24H  . folic acid  1 mg Per Tube Daily  . furosemide  40 mg Intravenous Q12H  . hydrocortisone sodium succinate  50 mg Intravenous Q6H  . insulin aspart  0-9 Units Subcutaneous Q4H  . mouth rinse  15 mL Mouth Rinse 10 times per day  . nicotine  7 mg Transdermal Daily  . pantoprazole (PROTONIX) IV  40 mg Intravenous Q24H  . polyethylene glycol  17 g Oral Daily  . thiamine  100 mg Oral Daily   Or  . thiamine  100 mg Intravenous Daily  . valproic acid  500 mg Per Tube BID   Continuous Infusions: . sodium chloride 10 mL/hr at 01/29/20 1120  .  ceFAZolin (ANCEF) IV Stopped (01/29/20 0547)  . diltiazem (CARDIZEM) infusion Stopped (01/29/20 1022)  . midazolam 0.5 mg/hr (01/29/20 1120)     LOS: 3 days    Time spent: 35 minutes    [name] Lindseth Darleen Crocker, DO [facility]  If 7PM-7AM,  please contact night-coverage [email or web address] 01/29/2020, 12:47 PM

## 2020-01-29 NOTE — Progress Notes (Signed)
*  PRELIMINARY RESULTS* Echocardiogram 2D Echocardiogram has been performed.  [name] [name] 01/29/2020, 9:23 AM

## 2020-01-29 NOTE — Progress Notes (Signed)
NAME:  [name] [name], MRN:  [id number], DOB:  05-Sep-1955, LOS: 3 ADMISSION DATE:  01/26/2020, CONSULTATION DATE:  01/28/20 REFERRING MD:  [place]/ [facility], CHIEF COMPLAINT:  resp distress    Brief History   7 yowf smoker admit 9/5 with R toe pain/ leg swelling p had nails cut with assoc chills/aches/  presumed cellulitis/ sepsis/ RAF with worse wob/sats on 9/7 so intubated by anestesia and PCCM service consulted.   History of present illness   Per H/P as pt intubated prior to PCCM consult:  64 y.o. female with medical history significant for COPD on 2L [place] at bedtime, hypertension, type 2 diabetes, obesity, bipolar disorder/anxiety disorder, ongoing tobacco use, questionable ongoing alcohol abuse, and prior polysubstance abuse with methamphetamine and alcohol who presented to the ED with progressive worsening of pain, erythema, and serosanguineous drainage from her right second toe.  This began to gradually develop after her daughter trimmed her nail too close to her skin approximately 2 weeks ago.  She had noticed some fever, chills, body aches, and mild headache as well as general malaise at home.  She tried soaking her foot at home with no improvement in her symptoms.  She denied any chest pain, shortness of breath, abdominal pain, nausea, vomiting, or dysuria.  She denied any upper respiratory symptoms as well.        Past Medical History  COPD/ 02 dep/ still smoking PTA  HBP DM  Obesity Bipolar ? Etoh abuse  Significant Hospital Events    Consults:  PCCM pm 9/7  Procedures:  Oral ET 9/7   Significant Diagnostic Tests:    Micro Data:  SARS covid   01/26/20 BC x 2  9/5  Group A strep sensitive to cephalosporins BC x 2  9/7 >>> MRSA PCR  9/8 >> neg   Antimicrobials:  Vanc 9/5 only Rocephin 9/5 only  Ancef 9/6 >>>   Scheduled Meds: . arformoterol  15 mcg Nebulization BID  . benztropine  0.5 mg Per Tube BID  . budesonide (PULMICORT) nebulizer solution  0.5 mg Nebulization  BID  . chlorhexidine gluconate (MEDLINE KIT)  15 mL Mouth Rinse BID  . Chlorhexidine Gluconate Cloth  6 each Topical Daily  . cloNIDine  0.1 mg Per Tube TID  . diltiazem  60 mg Per Tube Q6H  . docusate  100 mg Per Tube BID  . feeding supplement (VITAL HIGH PROTEIN)  1,000 mL Per Tube Q24H  . folic acid  1 mg Per Tube Daily  . hydrocortisone sodium succinate  50 mg Intravenous Q12H  . insulin aspart  0-9 Units Subcutaneous Q4H  . mouth rinse  15 mL Mouth Rinse 10 times per day  . nicotine  7 mg Transdermal Daily  . pantoprazole (PROTONIX) IV  40 mg Intravenous Q24H  . polyethylene glycol  17 g Oral Daily  . thiamine  100 mg Oral Daily   Or  . thiamine  100 mg Intravenous Daily  . valproic acid  500 mg Per Tube BID   Continuous Infusions: . sodium chloride 10 mL/hr at 01/29/20 1120  .  ceFAZolin (ANCEF) IV 2 g (01/29/20 1331)  . heparin 1,200 Units/hr (01/29/20 1339)  . lactated ringers    . midazolam 0.5 mg/hr (01/29/20 1120)  . potassium chloride 10 mEq (01/29/20 1349)   PRN Meds:.sodium chloride, acetaminophen **OR** acetaminophen, fentaNYL (SUBLIMAZE) injection, fentaNYL (SUBLIMAZE) injection, labetalol, levalbuterol, LORazepam **OR** LORazepam, ondansetron **OR** ondansetron (ZOFRAN) IV  Interim history/subjective:  Sedated on vent / still febrile  but hemodynamics ok   Objective   Blood pressure (!) 156/96, pulse 92, temperature 99.2 F (37.3 C), temperature source Axillary, resp. rate (!) 24, height '5\' 5"'  (1.651 m), weight 98.8 kg, SpO2 98 %.    Vent Mode: PRVC FiO2 (%):  [50 %-94 %] 50 % Set Rate:  [22 bmp] 22 bmp Vt Set:  [450 mL] 450 mL PEEP:  [5 cmH20] 5 cmH20 Plateau Pressure:  [18 cmH20-24 cmH20] 23 cmH20   Intake/Output Summary (Last 24 hours) at 01/29/2020 1401 Last data filed at 01/29/2020 1213 Gross per 24 hour  Intake 975.25 ml  Output 1250 ml  Net -274.75 ml   Filed Weights   01/27/20 1102 01/28/20 0500 01/29/20 0400  Weight: 101.1 kg 99.7 kg 98.8 kg     Examination: Tmax  100.1 -   downard trend Pt sedated on vent No jvd Oropharynx  Et/ OG Lungs with a minimal  scattered exp > insp rhonchi bilaterally RRR no s3 or or sign murmur Abd soft/ pos bowel sounds  Extr warm on R , no change cellulitis outlines Neuro  Sedated   I personally reviewed images and agree with radiology impression as follows:  CXR:   9/ 8 1. Unremarkable hardware positioning. 2. Left base infiltrate.  Resolved Hospital Problem list      Assessment & Plan:   1)  Acute respiratory failure/ hypoxemia and hypercabia  in setting of sepsis/ gm pos bacteremia from apparent cellulitis RLE  - ARDS protocol ok to use here  - adequate sedation at present - added clonidine anticipating wean off benzo's in AM 9/9 ? Try for extubation -  changed to hydrocortisone 9/7   2)  AFib with RAF > SR rx per Cards/ [facility]   3)  Severe cellulitis RLE p cutting toes/ warm foot and good pulse so no compartment issues yet    4)  HBP  Added clonidine 0.1 mg tid 9/8   Best practice:  Diet: tf started by nutrition  Pain/Anxiety/Delirium protocol (if indicated): sedated on vent VAP protocol (if indicated):  DVT prophylaxis: lovenox GI prophylaxis: protonix Glucose control: per [facility]  Mobility: bed rest Code Status: full  Family Communication: per [facility]  Disposition: ICU  Labs   CBC: Recent Labs  Lab 01/26/20 1350 01/27/20 0829 01/28/20 0404 01/29/20 0908  WBC 12.9* 10.2 12.3* 12.6*  NEUTROABS 11.3*  --   --   --   HGB 15.3* 13.7 14.3 13.4  HCT 48.8* 46.5* 48.9* 44.6  MCV 92.4 96.3 96.6 94.1  PLT 129* 97* 103* 103*    Basic Metabolic Panel: Recent Labs  Lab 01/26/20 1350 01/27/20 0829 01/28/20 0404 01/29/20 0614 01/29/20 0908  NA 137 140 142  --  143  K 4.1 4.3 3.7  --  3.0*  CL 97* 103 99  --  96*  CO2 '28 27 30  ' --  34*  GLUCOSE 166* 132* 171*  --  152*  BUN 38* 40* 36*  --  63*  CREATININE 1.10* 0.75 0.61 1.07* 1.03*  CALCIUM 9.0 8.2* 9.0  --   8.8*  MG  --  1.9 2.2  --  2.3  PHOS  --   --   --   --  2.3*   GFR: Estimated Creatinine Clearance: 65 mL/min (A) (by C-G formula based on SCr of 1.03 mg/dL (H)). Recent Labs  Lab 01/26/20 1350 01/26/20 1611 01/27/20 0829 01/28/20 0404 01/29/20 0908  PROCALCITON  --   --  3.34  --   --  WBC 12.9*  --  10.2 12.3* 12.6*  LATICACIDVEN 1.5 0.8 0.8  --   --     Liver Function Tests: Recent Labs  Lab 01/26/20 1350 01/27/20 0829  AST 14* 12*  ALT 17 14  ALKPHOS 96 86  BILITOT 0.5 0.2*  PROT 7.8 6.7  ALBUMIN 3.7 2.9*   No results for input(s): LIPASE, AMYLASE in the last 168 hours. No results for input(s): AMMONIA in the last 168 hours.  ABG    Component Value Date/Time   PHART 7.300 (L) 01/28/2020 1358   PCO2ART 78.6 (HH) 01/28/2020 1358   PO2ART 170 (H) 01/28/2020 1358   HCO3 31.8 (H) 01/28/2020 1358   O2SAT 99.1 01/28/2020 1358     Coagulation Profile: Recent Labs  Lab 01/26/20 1350 01/27/20 0829  INR 1.1 1.2    Cardiac Enzymes: No results for input(s): CKTOTAL, CKMB, CKMBINDEX, TROPONINI in the last 168 hours.  HbA1C: Hgb A1c MFr Bld  Date/Time Value Ref Range Status  01/26/2020 01:50 PM 5.8 (H) 4.8 - 5.6 % Final    Comment:    (NOTE) Pre diabetes:          5.7%-6.4%  Diabetes:              >6.4%  Glycemic control for   <7.0% adults with diabetes   04/12/2016 03:49 PM 5.6 4.8 - 5.6 % Final    Comment:    (NOTE)         Pre-diabetes: 5.7 - 6.4         Diabetes: >6.4         Glycemic control for adults with diabetes: <7.0     CBG: Recent Labs  Lab 01/28/20 1947 01/29/20 0013 01/29/20 0518 01/29/20 0712 01/29/20 1132  GLUCAP 134* 140* 142* 159* 125*        The patient is critically ill with multiple organ systems failure and requires high complexity decision making for assessment and support, frequent evaluation and titration of therapies, application of advanced monitoring technologies and extensive interpretation of multiple  databases. Critical Care Time devoted to patient care services described in this note is 35 minutes.   Christinia Gully, MD [facility] [place] [phone]   After 7:00 pm call Elink  [phone]

## 2020-01-29 NOTE — Progress Notes (Signed)
ANTICOAGULATION CONSULT NOTE - Follow Up Consult  Pharmacy Consult for Heparin Indication: atrial fibrillation  Allergies  Allergen Reactions  . Bactrim [Sulfamethoxazole-Trimethoprim] Anaphylaxis  . Amoxicillin Hives  . Diclofenac Tinitus    swelling in feet  . Morphine And Related Nausea And Vomiting and Other (See Comments)    headache  . Potassium-Containing Compounds Itching    Patient Measurements: Height: 5\' 5"  (165.1 cm) Weight: 98.8 kg (217 lb 13 oz) IBW/kg (Calculated) : 57 Heparin Dosing Weight:  80 kg  Vital Signs: Temp: 99.1 F (37.3 C) (09/08 1600) Temp Source: Rectal (09/08 1600) BP: 132/78 (09/08 2115) Pulse Rate: 91 (09/08 1946)  Labs: Recent Labs    01/27/20 0829 01/27/20 0829 01/28/20 0404 01/29/20 0614 01/29/20 0908  HGB 13.7   < > 14.3  --  13.4  HCT 46.5*  --  48.9*  --  44.6  PLT 97*  --  103*  --  103*  LABPROT 14.6  --   --   --   --   INR 1.2  --   --   --   --   CREATININE 0.75   < > 0.61 1.07* 1.03*   < > = values in this interval not displayed.    Estimated Creatinine Clearance: 65 mL/min (A) (by C-G formula based on SCr of 1.03 mg/dL (H)). }  Assessment: Anticoag: Afib. Baseline CBC WNL. Started IV heparin. Hep level <0.1.   Goal of Therapy:  Heparin level 0.3-0.7 units/ml Monitor platelets by anticoagulation protocol: Yes   Plan:  Give 2400 units bolus x 1  Increase heparin infusion to 1450 units/hr Daily HL and CBC Plan DOAC at discharge.   Carline Dura S. Alford Highland, PharmD, BCPS Clinical Staff Pharmacist [email or web address] Alford Highland, [place] 01/29/2020,9:35 PM

## 2020-01-30 DIAGNOSIS — I4891 Unspecified atrial fibrillation: Secondary | ICD-10-CM

## 2020-01-30 LAB — MAGNESIUM
Magnesium: 2.2 mg/dL (ref 1.7–2.4)
Magnesium: 2.1 mg/dL (ref 1.7–2.4)

## 2020-01-30 LAB — BASIC METABOLIC PANEL
Anion gap: 9 (ref 5–15)
BUN: 72 mg/dL — ABNORMAL HIGH (ref 8–23)
CO2: 34 mmol/L — ABNORMAL HIGH (ref 22–32)
Calcium: 8.9 mg/dL (ref 8.9–10.3)
Chloride: 97 mmol/L — ABNORMAL LOW (ref 98–111)
Creatinine, Ser: 0.62 mg/dL (ref 0.44–1.00)
GFR calc Af Amer: >60 mL/min (ref >60)
GFR calc non Af Amer: >60 mL/min (ref >60)
Glucose, Bld: 204 mg/dL — ABNORMAL HIGH (ref 70–99)
Potassium: 3.5 mmol/L (ref 3.5–5.1)
Sodium: 140 mmol/L (ref 135–145)

## 2020-01-30 LAB — GLUCOSE, CAPILLARY
Glucose-Capillary: 128 mg/dL — ABNORMAL HIGH (ref 70–99)
Glucose-Capillary: 138 mg/dL — ABNORMAL HIGH (ref 70–99)
Glucose-Capillary: 146 mg/dL — ABNORMAL HIGH (ref 70–99)
Glucose-Capillary: 157 mg/dL — ABNORMAL HIGH (ref 70–99)
Glucose-Capillary: 178 mg/dL — ABNORMAL HIGH (ref 70–99)
Glucose-Capillary: 205 mg/dL — ABNORMAL HIGH (ref 70–99)

## 2020-01-30 LAB — HEPARIN LEVEL (UNFRACTIONATED)
Heparin Unfractionated: <0.1 IU/mL — ABNORMAL LOW (ref 0.30–0.70)
Heparin Unfractionated: <0.1 IU/mL — ABNORMAL LOW (ref 0.30–0.70)

## 2020-01-30 LAB — T3, FREE: T3, Free: 0.7 pg/mL — ABNORMAL LOW (ref 2.0–4.4)

## 2020-01-30 LAB — PHOSPHORUS
Phosphorus: 1.9 mg/dL — ABNORMAL LOW (ref 2.5–4.6)
Phosphorus: 2.3 mg/dL — ABNORMAL LOW (ref 2.5–4.6)

## 2020-01-30 LAB — CBC
HCT: 42.3 % (ref 36.0–46.0)
Hemoglobin: 12.7 g/dL (ref 12.0–15.0)
MCH: 28.3 pg (ref 26.0–34.0)
MCHC: 30 g/dL (ref 30.0–36.0)
MCV: 94.4 fL (ref 80.0–100.0)
Platelets: 113 10*3/uL — ABNORMAL LOW (ref 150–400)
RBC: 4.48 MIL/uL (ref 3.87–5.11)
RDW: 16 % — ABNORMAL HIGH (ref 11.5–15.5)
WBC: 14.2 10*3/uL — ABNORMAL HIGH (ref 4.0–10.5)
nRBC: 0 % (ref 0.0–0.2)

## 2020-01-30 LAB — TRIGLYCERIDES: Triglycerides: 313 mg/dL — ABNORMAL HIGH (ref <150)

## 2020-01-30 MED ORDER — HEPARIN BOLUS VIA INFUSION
2500.0000 [IU] | Freq: Once | INTRAVENOUS | Status: AC
  Administered 2020-01-30: 2500 [IU] via INTRAVENOUS
  Filled 2020-01-30: qty 2500

## 2020-01-30 NOTE — Progress Notes (Addendum)
ANTICOAGULATION CONSULT NOTE - Follow Up Consult  Pharmacy Consult for Heparin Indication: atrial fibrillation  Allergies  Allergen Reactions  . Bactrim [Sulfamethoxazole-Trimethoprim] Anaphylaxis  . Amoxicillin Hives  . Diclofenac Tinitus    swelling in feet  . Morphine And Related Nausea And Vomiting and Other (See Comments)    headache  . Potassium-Containing Compounds Itching    Patient Measurements: Height: 5\' 5"  (165.1 cm) Weight: 100.2 kg (220 lb 14.4 oz) IBW/kg (Calculated) : 57 Heparin Dosing Weight:  80 kg  Vital Signs: Temp: 99.8 F (37.7 C) (09/09 1600) Temp Source: Rectal (09/09 1600) BP: 117/59 (09/09 1630) Pulse Rate: 85 (09/09 1630)  Labs: Recent Labs    01/28/20 0404 01/28/20 0404 01/29/20 7824 01/29/20 0908 01/29/20 2025 01/30/20 0505 01/30/20 1512  HGB 14.3   < >  --  13.4  --  12.7  --   HCT 48.9*  --   --  44.6  --  42.3  --   PLT 103*  --   --  103*  --  113*  --   HEPARINUNFRC  --   --   --   --  <0.10* <0.10* <0.10*  CREATININE 0.61   < > 1.07* 1.03*  --  0.62  --    < > = values in this interval not displayed.    Estimated Creatinine Clearance: 84.4 mL/min (by C-G formula based on SCr of 0.62 mg/dL). }  Assessment: Anticoag: Afib. Baseline CBC WNL. Started IV heparin.   Hep level still <0.1.  Verified no issues with RN but says patient is a bit swollen and a hard stick. May try to find alternate IV site just in case  Goal of Therapy:  Heparin level 0.3-0.7 units/ml Monitor platelets by anticoagulation protocol: Yes   Plan:  Give 2500 units bolus x 1  Increase heparin infusion to 1900 units/hr HL in 6 hours and daily Plan DOAC at discharge.   Margot Ables, PharmD Clinical Pharmacist 01/30/2020 5:06 PM

## 2020-01-30 NOTE — Progress Notes (Signed)
ANTICOAGULATION CONSULT NOTE - Follow Up Consult  Pharmacy Consult for Heparin Indication: atrial fibrillation  Allergies  Allergen Reactions  . Bactrim [Sulfamethoxazole-Trimethoprim] Anaphylaxis  . Amoxicillin Hives  . Diclofenac Tinitus    swelling in feet  . Morphine And Related Nausea And Vomiting and Other (See Comments)    headache  . Potassium-Containing Compounds Itching    Patient Measurements: Height: 5\' 5"  (165.1 cm) Weight: 100.2 kg (220 lb 14.4 oz) IBW/kg (Calculated) : 57 Heparin Dosing Weight:  80 kg  Vital Signs: Temp: 99.4 F (37.4 C) (09/09 0742) Temp Source: Rectal (09/09 0742) BP: 127/73 (09/09 0800) Pulse Rate: 84 (09/09 0800)  Labs: Recent Labs    01/27/20 0829 01/27/20 0829 01/28/20 0404 01/28/20 0404 01/29/20 0614 01/29/20 0908 01/29/20 2025 01/30/20 0505  HGB 13.7   < > 14.3   < >  --  13.4  --  12.7  HCT 46.5*   < > 48.9*  --   --  44.6  --  42.3  PLT 97*   < > 103*  --   --  103*  --  113*  LABPROT 14.6  --   --   --   --   --   --   --   INR 1.2  --   --   --   --   --   --   --   HEPARINUNFRC  --   --   --   --   --   --  <0.10* <0.10*  CREATININE 0.75   < > 0.61   < > 1.07* 1.03*  --  0.62   < > = values in this interval not displayed.    Estimated Creatinine Clearance: 84.4 mL/min (by C-G formula based on SCr of 0.62 mg/dL). }  Assessment: Anticoag: Afib. Baseline CBC WNL. Started IV heparin. Hep level <0.1.   Goal of Therapy:  Heparin level 0.3-0.7 units/ml Monitor platelets by anticoagulation protocol: Yes   Plan:  Give 2500 units bolus x 1  Increase heparin infusion to 1650 units/hr Daily HL and CBC Plan DOAC at discharge.   Margot Ables, PharmD Clinical Pharmacist 01/30/2020 8:16 AM

## 2020-01-30 NOTE — Progress Notes (Signed)
Progress Note  Patient Name: [name] [name] Date of Encounter: 01/30/2020  Primary Cardiologist: Carlyle Dolly, MD  Subjective   Patient sedated on the ventilator.  I spoke with her daughter present in the room.  Inpatient Medications    Scheduled Meds:  arformoterol  15 mcg Nebulization BID   benztropine  0.5 mg Per Tube BID   budesonide (PULMICORT) nebulizer solution  0.5 mg Nebulization BID   chlorhexidine gluconate (MEDLINE KIT)  15 mL Mouth Rinse BID   Chlorhexidine Gluconate Cloth  6 each Topical Daily   cloNIDine  0.1 mg Per Tube TID   diltiazem  60 mg Per Tube Q6H   docusate  100 mg Per Tube BID   feeding supplement (VITAL HIGH PROTEIN)  1,000 mL Per Tube X32G   folic acid  1 mg Per Tube Daily   heparin  2,500 Units Intravenous Once   hydrocortisone sodium succinate  50 mg Intravenous Q12H   insulin aspart  0-9 Units Subcutaneous Q4H   mouth rinse  15 mL Mouth Rinse 10 times per day   nicotine  7 mg Transdermal Daily   pantoprazole (PROTONIX) IV  40 mg Intravenous Q24H   polyethylene glycol  17 g Oral Daily   thiamine  100 mg Oral Daily   Or   thiamine  100 mg Intravenous Daily   valproic acid  500 mg Per Tube BID   Continuous Infusions:  sodium chloride 10 mL/hr at 01/29/20 1401    ceFAZolin (ANCEF) IV 200 mL/hr at 01/30/20 0630   heparin 1,450 Units/hr (01/30/20 0630)   lactated ringers Stopped (01/30/20 0601)   midazolam 0.5 mg/hr (01/30/20 0630)   PRN Meds: sodium chloride, acetaminophen **OR** acetaminophen, fentaNYL (SUBLIMAZE) injection, fentaNYL (SUBLIMAZE) injection, labetalol, levalbuterol, ondansetron **OR** ondansetron (ZOFRAN) IV   Vital Signs    Vitals:   01/30/20 0730 01/30/20 0742 01/30/20 0800 01/30/20 0817  BP: 125/75  127/73   Pulse: 80 81 84   Resp: (!) 29 (!) 28 (!) 28   Temp:  99.4 F (37.4 C)    TempSrc:  Rectal    SpO2: 92% 93% 93% 93%  Weight:      Height:        Intake/Output Summary (Last  24 hours) at 01/30/2020 0837 Last data filed at 01/30/2020 0630 Gross per 24 hour  Intake 2321.13 ml  Output 1000 ml  Net 1321.13 ml   Filed Weights   01/28/20 0500 01/29/20 0400 01/30/20 0500  Weight: 99.7 kg 98.8 kg 100.2 kg    Telemetry    Sinus rhythm.  Personally reviewed.  ECG    An ECG dated January 26, 2020 was personally reviewed today and demonstrated:  Sinus tachycardia with LVH.  Physical Exam   GEN:  Patient intubated on the ventilator, no distress.   Neck: No obvious JVD. Cardiac: RRR, no gallop.  Respiratory: Nonlabored. Clear to auscultation anteriorly. GI: Soft, nontender, bowel sounds present. MS: No edema; No deformity. Neuro:   Sedated.  Labs    Chemistry Recent Labs  Lab 01/26/20 1350 01/26/20 1350 01/27/20 0829 01/27/20 0829 01/28/20 0404 01/28/20 0404 01/29/20 4010 01/29/20 0908 01/30/20 0505  NA 137   < > 140   < > 142  --   --  143 140  K 4.1   < > 4.3   < > 3.7  --   --  3.0* 3.5  CL 97*   < > 103   < > 99  --   --  96* 97*  CO2 28   < > 27   < > 30  --   --  34* 34*  GLUCOSE 166*   < > 132*   < > 171*  --   --  152* 204*  BUN 38*   < > 40*   < > 36*  --   --  63* 72*  CREATININE 1.10*   < > 0.75   < > 0.61   < > 1.07* 1.03* 0.62  CALCIUM 9.0   < > 8.2*   < > 9.0  --   --  8.8* 8.9  PROT 7.8  --  6.7  --   --   --   --   --   --   ALBUMIN 3.7  --  2.9*  --   --   --   --   --   --   AST 14*  --  12*  --   --   --   --   --   --   ALT 17  --  14  --   --   --   --   --   --   ALKPHOS 96  --  86  --   --   --   --   --   --   BILITOT 0.5  --  0.2*  --   --   --   --   --   --   GFRNONAA 53*   < > >60   < > >60   < > 55* 58* >60  GFRAA >60   < > >60   < > >60   < > >60 >60 >60  ANIONGAP 12   < > 10   < > 13  --   --  13 9   < > = values in this interval not displayed.     Hematology Recent Labs  Lab 01/28/20 0404 01/29/20 0908 01/30/20 0505  WBC 12.3* 12.6* 14.2*  RBC 5.06 4.74 4.48  HGB 14.3 13.4 12.7  HCT 48.9* 44.6 42.3    MCV 96.6 94.1 94.4  MCH 28.3 28.3 28.3  MCHC 29.2* 30.0 30.0  RDW 15.2 15.6* 16.0*  PLT 103* 103* 113*    Radiology    DG CHEST PORT 1 VIEW  Result Date: 01/29/2020 CLINICAL DATA:  Nasogastric tube placement EXAM: PORTABLE CHEST 1 VIEW COMPARISON:  Yesterday FINDINGS: Nasogastric tube tip reaches the stomach at least. The side port is not visualized on this study. The endotracheal tube tip is at the clavicular heads. Patchy infiltrate at the left base. Interstitial coarsening on both sides. Borderline heart size, stable. No visible effusion or pneumothorax. IMPRESSION: 1. Unremarkable hardware positioning. 2. Left base infiltrate. Electronically Signed   By: Monte Fantasia M.D.   On: 01/29/2020 11:41   DG CHEST PORT 1 VIEW  Result Date: 01/28/2020 CLINICAL DATA:  Hypoxia EXAM: PORTABLE CHEST 1 VIEW COMPARISON:  January 27, 2020 and January 10, 2020; May 27, 2018 FINDINGS: Endotracheal tube tip is 7.7 cm above the carina. Nasogastric tube tip is in the proximal stomach with the side port at the gastroesophageal junction. No pneumothorax. There is interstitial thickening throughout the lungs bilaterally, likely due to a degree of underlying fibrosis. There is no frank edema or airspace opacity. Heart is borderline enlarged with pulmonary vascularity normal. No adenopathy. No bone lesions. IMPRESSION: Tube positions as described without pneumothorax. It may be prudent to consider  advancing nasogastric tube 4-5 cm to insure that both the tip and side port are well within the stomach. Underlying apparent interstitial fibrosis. No frank edema or airspace opacity. A degree of interstitial edema superimposed on fibrosis is difficult to exclude in this circumstance. No consolidation. Stable cardiac prominence.  No adenopathy demonstrable. Electronically Signed   By: Lowella Grip III M.D.   On: 01/28/2020 13:22   ECHOCARDIOGRAM COMPLETE  Result Date: 01/29/2020    ECHOCARDIOGRAM REPORT   Patient  Name:   [name] [name] Date of Exam: 01/29/2020 Medical Rec #:  [id number]         Height:       65.0 in Accession #:    [id number]        Weight:       217.8 lb Date of Birth:  09/25/1955         BSA:          2.051 m Patient Age:    62 years          BP:           126/66 mmHg Patient Gender: F                 HR:           88 bpm. Exam Location:  [facility] Procedure: 2D Echo Indications:    Cardiomegaly 429.3 / I51.7,  History:        Patient has no prior history of Echocardiogram examinations.                 COPD; Risk Factors:Current Smoker, Dyslipidemia, Hypertension                 and Diabetes. Severe Sepsis, ETOH, Bipolar, sepsis/ gm pos                 bacteremia.  Sonographer:    Leavy Cella RDCS (AE) Referring Phys: [place]  1. Left ventricular ejection fraction, by estimation, is 55 to 60%. The left ventricle has normal function. The left ventricle has no regional wall motion abnormalities. There is mild left ventricular hypertrophy. Left ventricular diastolic parameters are indeterminate.  2. Right ventricular systolic function is normal. The right ventricular size is normal. Tricuspid regurgitation signal is inadequate for assessing PA pressure.  3. Left atrial size was mildly dilated.  4. The mitral valve is abnormal, mildly calcified. Trivial mitral valve regurgitation. Moderate mitral annular calcification.  5. Tricuspid valve is mildly calcified with trivial tricuspid regurgitation.  6. The aortic valve is tricuspid. Aortic valve regurgitation is not visualized. Mild to moderate aortic valve sclerosis/calcification is present, without any evidence of aortic stenosis.  7. The inferior vena cava is normal in size with <50% respiratory variability, suggesting right atrial pressure of 8 mmHg.  8. No definite valvular vegetations. FINDINGS  Left Ventricle: Left ventricular ejection fraction, by estimation, is 55 to 60%. The left ventricle has normal function. The left  ventricle has no regional wall motion abnormalities. The left ventricular internal cavity size was normal in size. There is  mild left ventricular hypertrophy. Left ventricular diastolic parameters are indeterminate. Right Ventricle: The right ventricular size is normal. No increase in right ventricular wall thickness. Right ventricular systolic function is normal. Tricuspid regurgitation signal is inadequate for assessing PA pressure. Left Atrium: Left atrial size was mildly dilated. Right Atrium: Right atrial size was normal in size. Pericardium: There is no evidence of pericardial effusion. Mitral Valve: The mitral  valve is abnormal. There is mild thickening of the mitral valve leaflet(s). Moderate mitral annular calcification. Trivial mitral valve regurgitation. Tricuspid Valve: Tricuspid valve is mildly calcified with trivial tricuspid regurgitation. The tricuspid valve is grossly normal. Tricuspid valve regurgitation is trivial. Aortic Valve: The aortic valve is tricuspid. There is mild to moderate aortic valve annular calcification. Aortic valve regurgitation is not visualized. Mild to moderate aortic valve sclerosis/calcification is present, without any evidence of aortic stenosis. Pulmonic Valve: The pulmonic valve was grossly normal. Pulmonic valve regurgitation is trivial. Aorta: The aortic root is normal in size and structure. Venous: The inferior vena cava is normal in size with less than 50% respiratory variability, suggesting right atrial pressure of 8 mmHg. IAS/Shunts: No atrial level shunt detected by color flow Doppler.  LEFT VENTRICLE PLAX 2D LVIDd:         3.71 cm  Diastology LVIDs:         2.08 cm  LV e' medial:    4.87 cm/s LV PW:         1.28 cm  LV E/e' medial:  22.2 LV IVS:        1.23 cm  LV e' lateral:   4.95 cm/s LVOT diam:     2.00 cm  LV E/e' lateral: 21.8 LVOT Area:     3.14 cm  RIGHT VENTRICLE RV S prime:     11.70 cm/s TAPSE (M-mode): 2.7 cm LEFT ATRIUM             Index       RIGHT  ATRIUM           Index LA diam:        3.70 cm 1.80 cm/m  RA Area:     20.40 cm LA Vol (A2C):   72.8 ml 35.49 ml/m RA Volume:   66.90 ml  32.62 ml/m LA Vol (A4C):   76.6 ml 37.34 ml/m LA Biplane Vol: 76.9 ml 37.49 ml/m   AORTA Ao Root diam: 2.80 cm MITRAL VALVE MV Area (PHT): 3.99 cm     SHUNTS MV Decel Time: 190 msec     Systemic Diam: 2.00 cm MR Peak grad: 83.2 mmHg MR Vmax:      456.00 cm/s MV E velocity: 108.00 cm/s MV A velocity: 124.00 cm/s MV E/A ratio:  0.87 Rozann Lesches MD Electronically signed by Rozann Lesches MD Signature Date/Time: 01/29/2020/10:08:48 AM    Final     Patient Profile     64 y.o. female with a history of COPD and chronic hypoxic respiratory failure, hypertension, type 2 diabetes mellitus, bipolar disorder and anxiety, and previous polysubstance use.  Assessment & Plan    1.  Atrial fibrillation with RVR, spontaneously converted to sinus rhythm.  Occurred in the setting of significant physiologic stressors with sepsis and acute on chronic hypoxic/hypercarbic respiratory failure requiring ventilatory support.  CHA2DS2-VASc score is 3.  Currently on Cardizem 60 mg every 6 hours and IV heparin.  LVEF 55 to 60% with mild left atrial enlargement.  2.  Sepsis with blood cultures positive for Streptococcus pyogenes.  She does have suspected right leg cellulitis and is currently on Ancef.  She is afebrile.  Echocardiogram did not reveal any obvious valvular vegetations.  3.  Acute renal insufficiency, creatinine improving now down to 0.62.  4.  Thrombocytopenia, platelets 113.  Suspected to be related to sepsis.  Would continue to watch closely given recent initiation of heparin.  5.  Acute on chronic hypoxic/hypercarbic respiratory failure.  She has COPD with tobacco abuse at baseline.  Currently intubated on ventilator with follow-up by PCCM.  I reviewed the chart and Dr. Nelly Laurence note from yesterday.  At this point would continue Cardizem and heparin (watch  platelets).  Continue supportive measures and work toward extubation with assistance from PCCM.  She is on Ancef for treatment of Streptococcus pyogenes, currently afebrile, and organism sensitive.  Would agree that a TEE is reasonable eventually, however would hold off until she is more clinically stable and extubated so that optimal images are obtained.  She remains in sinus rhythm today.  Signed, Rozann Lesches, MD  01/30/2020, 8:37 AM

## 2020-01-30 NOTE — Progress Notes (Addendum)
PROGRESS NOTE    [name] [name]  KGM:[id number] DOB: 15-Nov-1955 DOA: 01/26/2020 PCP: Leonard Downing, MD   Brief Narrative:  As per H&P written by Dr. Manuella Ghazi on 01/26/2020 [name] [name] a 64 y.o.femalewith medical history significant forCOPD on 2L [place] at bedtime, hypertension, type 2 diabetes, obesity, bipolar disorder/anxiety disorder, ongoing tobacco use,questionable ongoing alcohol abuse,and prior polysubstance abuse with methamphetamine and alcohol who presented to the ED with progressive worsening of pain, erythema, and serosanguineous drainage from her right second toe. This began to gradually develop after her daughter trimmed her nail too close to her skin approximately 2 weeks ago. She has noticed some fever, chills, body aches, and mild headache as well as general malaise at home. She tried soaking her foot at home with no improvement in her symptoms. She denies any chest pain, shortness of breath, abdominal pain, nausea, vomiting, or dysuria. She denies any upper respiratory symptoms as well. She has not been vaccinated for COVID-19.  ED Course:Vital signs demonstrate ongoing sinus tachycardia and this is confirmed on EKG with heart rate approximately 110 bpm. Temperature 1-1.5 Fahrenheit. She is noted to have a leukocytosis of 12,900 as well as a thrombocytopenia of 129,000. BUN is 38 and creatinine is elevated at 1.1 with baseline 0.3-0.5. Chest x-ray with mild cardiomegaly and no other acute findings. Covid testing negative. Right foot x-ray with no soft tissue swelling or signs of osteomyelitis noted. Lactic acid is 1.5. Patient has been ordered and started 3 L fluid bolus as well as vancomycin and Rocephin for suspected sepsis secondary to cellulitis.  9/8: Patient remains comfortable on ventilator and is noted to be in sinus rhythm this morning.  Plan to discontinue IV Cardizem drip and transition to oral as ordered.  2D echocardiogram with LVEF 55-60%  with no vegetations noted.  She is being treated with Ancef for group A strep bacteremia related to cellulitis.  Plan to consult cardiology today for planning of TEE.  Start tube feedings as well.  Appreciate further PCCM recommendations.  9/9: Patient remains comfortable on the ventilator and at 40% FiO2.  Patient remains in sinus rhythm with Cardizem as ordered by cardiology.  Plans for TEE once patient stable and off ventilator.  Assessment & Plan:   Active Problems:   Acute respiratory failure with hypoxia and hypercapnia (HCC)   Sepsis (HCC)   Cellulitis of right lower leg   1-sepsis: In the setting of Streptococcus cellulitis/bacteremia. Present on admission -Patient met sepsis criteria on admission with sinus tachycardia, elevated white blood cells, febrile and with identified source of infection of cellulitic process. She had acute kidney injury as organ dysfunction. -No signs of osteomyelitis onfootchest x-ray -Blood culture positive for Streptococcus -Initially started on vancomycin which has been DC since MRSA negative -Continue to follow ESR and CRP -Continue on Ancef based on sensitivities -Transthoracic echocardiogram with LVEF 55 to 60% and no vegetations noted, appreciate cardiology consultation for TEE once extubated -Continue as needed antipyretics and supportive care.  2-acute on chronic respiratory failure with hypoxia and hypercapnia s/p intubation -Appears to be a combination of COPD exacerbation, Underlying obesity hypoventilation syndrome and component of interstitial pulmonary edema/vascular congestion after fluid resuscitation as per sepsis protocol. -Hold further lasix -PCCM following  3-acute kidney injury-resolved -Baseline creatinine 0.6-0.7, currently around 0.6 -Continue tube feeds and fluid resuscitation  4-mild thrombocytopenia-stable -Likely related to acute sepsis and underlying history of alcohol abuse -No signs of overt bleeding -Monitor  carefully while on heparin drip -Repeat CBC in a.m.  5-alcohol abuse -Continue CIWA monitoring -Continue thiamine and folic acid.  6-class II obesity  -Body mass index is 36.58 kg/m. -Low calorie diet, portion control and increase physical activity discussed with patient.  7-history of hypertension -Holding home amlodipine for now -Hold further Lasix -Plan to discontinue hydrocortisone 9/9 -Continue oral Cardizem -Labetalol as needed  8-history of tobacco abuse -Cessation counseling has been provided. -Continue nicotine patch.  9-depression/anxiety/history of seizures -Plan is to resume psychotropic home medications once able to take by mouth. -Continue Depakote  10-type 2 diabetes mellitus -A1c 5.8 -While inpatient continue sliding scale insulin -At discharge will resume the use of Metformin in order to continue assisting with management of insulin resistant and obesity. -Modified carbohydrate diet and lifestylerecommended after discharge.  11-new onset atrial fibrillation with RVR-currently in sinus rhythm -Continue oral Cardizem as ordered -TSH 0.294, check free T4 and T3 -2D echocardiogram with LVEF 55-60% -CHA2DS2-VASc score of 3 for which I will start heparin drip -Appreciate cardiology recommendations for eventual TEE once extubated  12-Hypokalemia -Resolved -Monitor  DVT prophylaxis:Heparin drip Code Status: Full Family Communication: Discussed with daughter at bedside 9/9 Disposition Plan:  Status is: Inpatient  Remains inpatient appropriate because:Ongoing diagnostic testing needed not appropriate for outpatient work up, IV treatments appropriate due to intensity of illness or inability to take PO and Inpatient level of care appropriate due to severity of illness   Dispo: The patient is from: Home  Anticipated d/c is to: SNF  Anticipated d/c date is: > 3 days  Patient currently is not medically stable  to d/c.  Patient currently remains intubated on the ventilator and will need further evaluation per cardiology along with ongoing evaluation per PCCM.  Consultants:   PCCM  Cardiology  Procedures:   2D echocardiogram 9/8 with LVEF 55-60%.  No vegetations.  Antimicrobials:  Anti-infectives (From admission, onward)   Start     Dose/Rate Route Frequency Ordered Stop   01/27/20 1500  vancomycin (VANCOREADY) IVPB 1500 mg/300 mL  Status:  Discontinued        1,500 mg 150 mL/hr over 120 Minutes Intravenous Every 24 hours 01/26/20 1428 01/27/20 0827   01/27/20 0830  ceFAZolin (ANCEF) IVPB 2g/100 mL premix        2 g 200 mL/hr over 30 Minutes Intravenous Every 8 hours 01/27/20 0827     01/26/20 1545  vancomycin (VANCOCIN) IVPB 1000 mg/200 mL premix  Status:  Discontinued        1,000 mg 200 mL/hr over 60 Minutes Intravenous  Once 01/26/20 1541 01/26/20 1553   01/26/20 1415  vancomycin (VANCOCIN) IVPB 1000 mg/200 mL premix  Status:  Discontinued        1,000 mg 200 mL/hr over 60 Minutes Intravenous  Once 01/26/20 1408 01/26/20 1413   01/26/20 1415  cefTRIAXone (ROCEPHIN) 2 g in sodium chloride 0.9 % 100 mL IVPB        2 g 200 mL/hr over 30 Minutes Intravenous  Once 01/26/20 1408 01/26/20 2011   01/26/20 1415  vancomycin (VANCOREADY) IVPB 2000 mg/400 mL        2,000 mg 200 mL/hr over 120 Minutes Intravenous  Once 01/26/20 1413 01/26/20 2011      Subjective: Patient seen and evaluated today with no new acute complaints or concerns. No acute concerns or events noted overnight.  She remains sedated on the ventilator.  Objective: Vitals:   01/30/20 0800 01/30/20 0817 01/30/20 0900 01/30/20 0930  BP: 127/73  136/79 138/77  Pulse: 84  87 86  Resp: (!) 28  (!) 29 (!) 29  Temp:      TempSrc:      SpO2: 93% 93% 91% 95%  Weight:      Height:        Intake/Output Summary (Last 24 hours) at 01/30/2020 1013 Last data filed at 01/30/2020 0630 Gross per 24 hour  Intake 2321.13 ml  Output  1000 ml  Net 1321.13 ml   Filed Weights   01/28/20 0500 01/29/20 0400 01/30/20 0500  Weight: 99.7 kg 98.8 kg 100.2 kg    Examination:  General exam: Appears calm and comfortable, sedated Respiratory system: Clear to auscultation. Respiratory effort normal.  Currently intubated on FiO2 40%. Cardiovascular system: S1 & S2 heard, RRR. Gastrointestinal system: Abdomen is nondistended, soft and nontender.  Tube feeds ongoing. Central nervous system: Sedated Extremities: Skin changes as prior.  No edema. Psychiatry: Cannot be assessed    Data Reviewed: I have personally reviewed following labs and imaging studies  CBC: Recent Labs  Lab 01/26/20 1350 01/27/20 0829 01/28/20 0404 01/29/20 0908 01/30/20 0505  WBC 12.9* 10.2 12.3* 12.6* 14.2*  NEUTROABS 11.3*  --   --   --   --   HGB 15.3* 13.7 14.3 13.4 12.7  HCT 48.8* 46.5* 48.9* 44.6 42.3  MCV 92.4 96.3 96.6 94.1 94.4  PLT 129* 97* 103* 103* 710*   Basic Metabolic Panel: Recent Labs  Lab 01/26/20 1350 01/26/20 1350 01/27/20 0829 01/28/20 0404 01/29/20 0614 01/29/20 0908 01/29/20 1632 01/30/20 0505  NA 137  --  140 142  --  143  --  140  K 4.1  --  4.3 3.7  --  3.0*  --  3.5  CL 97*  --  103 99  --  96*  --  97*  CO2 28  --  27 30  --  34*  --  34*  GLUCOSE 166*  --  132* 171*  --  152*  --  204*  BUN 38*  --  40* 36*  --  63*  --  72*  CREATININE 1.10*   < > 0.75 0.61 1.07* 1.03*  --  0.62  CALCIUM 9.0  --  8.2* 9.0  --  8.8*  --  8.9  MG  --   --  1.9 2.2  --  2.3 2.4 2.2  PHOS  --   --   --   --   --  2.3* 1.9* 1.9*   < > = values in this interval not displayed.   GFR: Estimated Creatinine Clearance: 84.4 mL/min (by C-G formula based on SCr of 0.62 mg/dL). Liver Function Tests: Recent Labs  Lab 01/26/20 1350 01/27/20 0829  AST 14* 12*  ALT 17 14  ALKPHOS 96 86  BILITOT 0.5 0.2*  PROT 7.8 6.7  ALBUMIN 3.7 2.9*   No results for input(s): LIPASE, AMYLASE in the last 168 hours. No results for input(s):  AMMONIA in the last 168 hours. Coagulation Profile: Recent Labs  Lab 01/26/20 1350 01/27/20 0829  INR 1.1 1.2   Cardiac Enzymes: No results for input(s): CKTOTAL, CKMB, CKMBINDEX, TROPONINI in the last 168 hours. BNP (last 3 results) No results for input(s): PROBNP in the last 8760 hours. HbA1C: No results for input(s): HGBA1C in the last 72 hours. CBG: Recent Labs  Lab 01/29/20 1545 01/29/20 1949 01/30/20 0014 01/30/20 0408 01/30/20 0741  GLUCAP 158* 150* 157* 178* 205*   Lipid Profile: Recent Labs    01/29/20 6269 01/30/20 0505  TRIG 185* 313*   Thyroid Function Tests: Recent Labs    01/29/20 0908 01/29/20 1632  TSH 0.294*  --   FREET4  --  0.75  T3FREE  --  0.7*   Anemia Panel: No results for input(s): VITAMINB12, FOLATE, FERRITIN, TIBC, IRON, RETICCTPCT in the last 72 hours. Sepsis Labs: Recent Labs  Lab 01/26/20 1350 01/26/20 1611 01/27/20 0829  PROCALCITON  --   --  3.34  LATICACIDVEN 1.5 0.8 0.8    Recent Results (from the past 240 hour(s))  SARS Coronavirus 2 by RT PCR (hospital order, performed in [facility] hospital lab) Nasopharyngeal Nasopharyngeal Swab     Status: None   Collection Time: 01/26/20  1:17 PM   Specimen: Nasopharyngeal Swab  Result Value Ref Range Status   SARS Coronavirus 2 NEGATIVE NEGATIVE Final    Comment: (NOTE) SARS-CoV-2 target nucleic acids are NOT DETECTED.  The SARS-CoV-2 RNA is generally detectable in upper and lower respiratory specimens during the acute phase of infection. The lowest concentration of SARS-CoV-2 viral copies this assay can detect is 250 copies / mL. A negative result does not preclude SARS-CoV-2 infection and should not be used as the sole basis for treatment or other patient management decisions.  A negative result may occur with improper specimen collection / handling, submission of specimen other than nasopharyngeal swab, presence of viral mutation(s) within the areas targeted by this  assay, and inadequate number of viral copies (<250 copies / mL). A negative result must be combined with clinical observations, patient history, and epidemiological information.  Fact Sheet for Patients:   StrictlyIdeas.no  Fact Sheet for Healthcare Providers: BankingDealers.co.za  This test is not yet approved or  cleared by the [place] FDA and has been authorized for detection and/or diagnosis of SARS-CoV-2 by FDA under an Emergency Use Authorization (EUA).  This EUA will remain in effect (meaning this test can be used) for the duration of the COVID-19 declaration under Section 564(b)(1) of the Act, 21 U.S.C. section 360bbb-3(b)(1), unless the authorization is terminated or revoked sooner.  Performed at [facility], [address], [place], [place] [address]   Blood Culture (routine x 2)     Status: Abnormal   Collection Time: 01/26/20  1:50 PM   Specimen: Right Antecubital; Blood  Result Value Ref Range Status   Specimen Description   Final    RIGHT ANTECUBITAL BOTTLES DRAWN AEROBIC AND ANAEROBIC Performed at [facility], [address], [place], [place] [address]    Special Requests   Final    Blood Culture adequate volume Performed at [facility], [address], [place], [place] [address]    Culture  Setup Time   Final    GRAM POSITIVE COCCI Gram Stain Report Called to,Read Back By and Verified With: KILMER,T @ 2924 ON 01/27/20 BY JUW IN BOTH AEROBIC AND ANAEROBIC BOTTLES GS DONE @ [facility] Organism ID to follow Performed at [facility], [place] [address] [place], [place] [address]    Culture GROUP A STREP (S.PYOGENES) ISOLATED (A)  Final   Report Status 01/29/2020 FINAL  Final   Organism ID, Bacteria GROUP A STREP (S.PYOGENES) ISOLATED  Final      Susceptibility   Group a strep (s.pyogenes) isolated - MIC*    PENICILLIN <=0.06 SENSITIVE Sensitive     CEFTRIAXONE <=0.12 SENSITIVE Sensitive     ERYTHROMYCIN >=8  RESISTANT Resistant     LEVOFLOXACIN 0.5 SENSITIVE Sensitive     VANCOMYCIN <=0.12 SENSITIVE Sensitive     *  GROUP A STREP (S.PYOGENES) ISOLATED  Blood Culture ID Panel (Reflexed)     Status: Abnormal   Collection Time: 01/26/20  1:50 PM  Result Value Ref Range Status   Enterococcus faecalis NOT DETECTED NOT DETECTED Final   Enterococcus Faecium NOT DETECTED NOT DETECTED Final   Listeria monocytogenes NOT DETECTED NOT DETECTED Final   Staphylococcus species NOT DETECTED NOT DETECTED Final   Staphylococcus aureus (BCID) NOT DETECTED NOT DETECTED Final   Staphylococcus epidermidis NOT DETECTED NOT DETECTED Final   Staphylococcus lugdunensis NOT DETECTED NOT DETECTED Final   Streptococcus species DETECTED (A) NOT DETECTED Final    Comment: CRITICAL RESULT CALLED TO, READ BACK BY AND VERIFIED WITH: G. Coffee PharmD 8:25 01/27/20 ([email or web address])    Streptococcus agalactiae NOT DETECTED NOT DETECTED Final   Streptococcus pneumoniae NOT DETECTED NOT DETECTED Final   Streptococcus pyogenes DETECTED (A) NOT DETECTED Final    Comment: CRITICAL RESULT CALLED TO, READ BACK BY AND VERIFIED WITH: G. Coffee PharmD 8:25 01/27/20 ([email or web address])    A.calcoaceticus-baumannii NOT DETECTED NOT DETECTED Final   Bacteroides fragilis NOT DETECTED NOT DETECTED Final   Enterobacterales NOT DETECTED NOT DETECTED Final   Enterobacter cloacae complex NOT DETECTED NOT DETECTED Final   Escherichia coli NOT DETECTED NOT DETECTED Final   Klebsiella aerogenes NOT DETECTED NOT DETECTED Final   Klebsiella oxytoca NOT DETECTED NOT DETECTED Final   Klebsiella pneumoniae NOT DETECTED NOT DETECTED Final   Proteus species NOT DETECTED NOT DETECTED Final   Salmonella species NOT DETECTED NOT DETECTED Final   Serratia marcescens NOT DETECTED NOT DETECTED Final   Haemophilus influenzae NOT DETECTED NOT DETECTED Final   Neisseria meningitidis NOT DETECTED NOT DETECTED Final   Pseudomonas aeruginosa NOT DETECTED NOT DETECTED Final    Stenotrophomonas maltophilia NOT DETECTED NOT DETECTED Final   Candida albicans NOT DETECTED NOT DETECTED Final   Candida auris NOT DETECTED NOT DETECTED Final   Candida glabrata NOT DETECTED NOT DETECTED Final   Candida krusei NOT DETECTED NOT DETECTED Final   Candida parapsilosis NOT DETECTED NOT DETECTED Final   Candida tropicalis NOT DETECTED NOT DETECTED Final   Cryptococcus neoformans/gattii NOT DETECTED NOT DETECTED Final    Comment: Performed at [facility], [address] [place], [place] [address]  Blood Culture (routine x 2)     Status: Abnormal   Collection Time: 01/26/20  2:00 PM   Specimen: Left Antecubital; Blood  Result Value Ref Range Status   Specimen Description   Final    LEFT ANTECUBITAL BOTTLES DRAWN AEROBIC AND ANAEROBIC Performed at [facility], [address] [place], [place] [address]    Special Requests   Final    Blood Culture adequate volume Performed at [facility], [address] [place], [place] [address]    Culture  Setup Time   Final    GRAM POSITIVE COCCI Gram Stain Report Called to,Read Back By and Verified With: KILMER,T @ 3614 ON 01/27/20 BY JUW AEROBIC BOTTLE ONLY GS DONE @ [facility] Performed at [facility], [address], [place], [place] [address]    Culture (A)  Final    GROUP A STREP (S.PYOGENES) ISOLATED SUSCEPTIBILITIES PERFORMED ON PREVIOUS CULTURE WITHIN THE LAST 5 DAYS. Performed at [facility], [place] [address], [place],  [address]    Report Status 01/29/2020 FINAL  Final  Culture, blood (Routine X 2) w Reflex to ID Panel     Status: None (Preliminary result)   Collection Time: 01/28/20  1:01 PM  Specimen: BLOOD RIGHT HAND  Result Value Ref Range Status   Specimen Description BLOOD RIGHT HAND  Final   Special Requests   Final    BOTTLES DRAWN AEROBIC ONLY Blood Culture results may not be optimal due to an inadequate volume of blood received in culture bottles   Culture   Final    NO GROWTH < 24  HOURS Performed at [facility], [address] [place], [place] [address]    Report Status PENDING  Incomplete  Culture, blood (Routine X 2) w Reflex to ID Panel     Status: None (Preliminary result)   Collection Time: 01/28/20  1:01 PM   Specimen: BLOOD RIGHT WRIST  Result Value Ref Range Status   Specimen Description BLOOD RIGHT WRIST  Final   Special Requests   Final    BOTTLES DRAWN AEROBIC AND ANAEROBIC Blood Culture adequate volume   Culture   Final    NO GROWTH < 24 HOURS Performed at [facility], [address] [place], [place] [address]    Report Status PENDING  Incomplete  MRSA PCR Screening     Status: None   Collection Time: 01/29/20 11:17 AM   Specimen: Nasal Mucosa; Nasopharyngeal  Result Value Ref Range Status   MRSA by PCR NEGATIVE NEGATIVE Final    Comment:        The GeneXpert MRSA Assay (FDA approved for NASAL specimens only), is one component of a comprehensive MRSA colonization surveillance program. It is not intended to diagnose MRSA infection nor to guide or monitor treatment for MRSA infections. Performed at [facility], [address], [place], [place] [address]          Radiology Studies: DG CHEST PORT 1 VIEW  Result Date: 01/29/2020 CLINICAL DATA:  Nasogastric tube placement EXAM: PORTABLE CHEST 1 VIEW COMPARISON:  Yesterday FINDINGS: Nasogastric tube tip reaches the stomach at least. The side port is not visualized on this study. The endotracheal tube tip is at the clavicular heads. Patchy infiltrate at the left base. Interstitial coarsening on both sides. Borderline heart size, stable. No visible effusion or pneumothorax. IMPRESSION: 1. Unremarkable hardware positioning. 2. Left base infiltrate. Electronically Signed   By: Monte Fantasia M.D.   On: 01/29/2020 11:41   DG CHEST PORT 1 VIEW  Result Date: 01/28/2020 CLINICAL DATA:  Hypoxia EXAM: PORTABLE CHEST 1 VIEW COMPARISON:  January 27, 2020 and January 10, 2020; May 27, 2018 FINDINGS:  Endotracheal tube tip is 7.7 cm above the carina. Nasogastric tube tip is in the proximal stomach with the side port at the gastroesophageal junction. No pneumothorax. There is interstitial thickening throughout the lungs bilaterally, likely due to a degree of underlying fibrosis. There is no frank edema or airspace opacity. Heart is borderline enlarged with pulmonary vascularity normal. No adenopathy. No bone lesions. IMPRESSION: Tube positions as described without pneumothorax. It may be prudent to consider advancing nasogastric tube 4-5 cm to insure that both the tip and side port are well within the stomach. Underlying apparent interstitial fibrosis. No frank edema or airspace opacity. A degree of interstitial edema superimposed on fibrosis is difficult to exclude in this circumstance. No consolidation. Stable cardiac prominence.  No adenopathy demonstrable. Electronically Signed   By: Lowella Grip III M.D.   On: 01/28/2020 13:22   ECHOCARDIOGRAM COMPLETE  Result Date: 01/29/2020    ECHOCARDIOGRAM REPORT   Patient Name:   KALIANA ALBINO Date of Exam: 01/29/2020 Medical Rec #:  [id number]  Height:       65.0 in Accession #:    [id number]        Weight:       217.8 lb Date of Birth:  05/14/1956         BSA:          2.051 m Patient Age:    53 years          BP:           126/66 mmHg Patient Gender: F                 HR:           88 bpm. Exam Location:  [facility] Procedure: 2D Echo Indications:    Cardiomegaly 429.3 / I51.7,  History:        Patient has no prior history of Echocardiogram examinations.                 COPD; Risk Factors:Current Smoker, Dyslipidemia, Hypertension                 and Diabetes. Severe Sepsis, ETOH, Bipolar, sepsis/ gm pos                 bacteremia.  Sonographer:    Leavy Cella RDCS (AE) Referring Phys: Jeff  1. Left ventricular ejection fraction, by estimation, is 55 to 60%. The left ventricle has normal function. The left ventricle has  no regional wall motion abnormalities. There is mild left ventricular hypertrophy. Left ventricular diastolic parameters are indeterminate.  2. Right ventricular systolic function is normal. The right ventricular size is normal. Tricuspid regurgitation signal is inadequate for assessing PA pressure.  3. Left atrial size was mildly dilated.  4. The mitral valve is abnormal, mildly calcified. Trivial mitral valve regurgitation. Moderate mitral annular calcification.  5. Tricuspid valve is mildly calcified with trivial tricuspid regurgitation.  6. The aortic valve is tricuspid. Aortic valve regurgitation is not visualized. Mild to moderate aortic valve sclerosis/calcification is present, without any evidence of aortic stenosis.  7. The inferior vena cava is normal in size with <50% respiratory variability, suggesting right atrial pressure of 8 mmHg.  8. No definite valvular vegetations. FINDINGS  Left Ventricle: Left ventricular ejection fraction, by estimation, is 55 to 60%. The left ventricle has normal function. The left ventricle has no regional wall motion abnormalities. The left ventricular internal cavity size was normal in size. There is  mild left ventricular hypertrophy. Left ventricular diastolic parameters are indeterminate. Right Ventricle: The right ventricular size is normal. No increase in right ventricular wall thickness. Right ventricular systolic function is normal. Tricuspid regurgitation signal is inadequate for assessing PA pressure. Left Atrium: Left atrial size was mildly dilated. Right Atrium: Right atrial size was normal in size. Pericardium: There is no evidence of pericardial effusion. Mitral Valve: The mitral valve is abnormal. There is mild thickening of the mitral valve leaflet(s). Moderate mitral annular calcification. Trivial mitral valve regurgitation. Tricuspid Valve: Tricuspid valve is mildly calcified with trivial tricuspid regurgitation. The tricuspid valve is grossly normal.  Tricuspid valve regurgitation is trivial. Aortic Valve: The aortic valve is tricuspid. There is mild to moderate aortic valve annular calcification. Aortic valve regurgitation is not visualized. Mild to moderate aortic valve sclerosis/calcification is present, without any evidence of aortic stenosis. Pulmonic Valve: The pulmonic valve was grossly normal. Pulmonic valve regurgitation is trivial. Aorta: The aortic root is normal in size and structure. Venous: The inferior vena cava  is normal in size with less than 50% respiratory variability, suggesting right atrial pressure of 8 mmHg. IAS/Shunts: No atrial level shunt detected by color flow Doppler.  LEFT VENTRICLE PLAX 2D LVIDd:         3.71 cm  Diastology LVIDs:         2.08 cm  LV e' medial:    4.87 cm/s LV PW:         1.28 cm  LV E/e' medial:  22.2 LV IVS:        1.23 cm  LV e' lateral:   4.95 cm/s LVOT diam:     2.00 cm  LV E/e' lateral: 21.8 LVOT Area:     3.14 cm  RIGHT VENTRICLE RV S prime:     11.70 cm/s TAPSE (M-mode): 2.7 cm LEFT ATRIUM             Index       RIGHT ATRIUM           Index LA diam:        3.70 cm 1.80 cm/m  RA Area:     20.40 cm LA Vol (A2C):   72.8 ml 35.49 ml/m RA Volume:   66.90 ml  32.62 ml/m LA Vol (A4C):   76.6 ml 37.34 ml/m LA Biplane Vol: 76.9 ml 37.49 ml/m   AORTA Ao Root diam: 2.80 cm MITRAL VALVE MV Area (PHT): 3.99 cm     SHUNTS MV Decel Time: 190 msec     Systemic Diam: 2.00 cm MR Peak grad: 83.2 mmHg MR Vmax:      456.00 cm/s MV E velocity: 108.00 cm/s MV A velocity: 124.00 cm/s MV E/A ratio:  0.87 Rozann Lesches MD Electronically signed by Rozann Lesches MD Signature Date/Time: 01/29/2020/10:08:48 AM    Final         Scheduled Meds: . arformoterol  15 mcg Nebulization BID  . benztropine  0.5 mg Per Tube BID  . budesonide (PULMICORT) nebulizer solution  0.5 mg Nebulization BID  . chlorhexidine gluconate (MEDLINE KIT)  15 mL Mouth Rinse BID  . Chlorhexidine Gluconate Cloth  6 each Topical Daily  . cloNIDine   0.1 mg Per Tube TID  . diltiazem  60 mg Per Tube Q6H  . docusate  100 mg Per Tube BID  . feeding supplement (VITAL HIGH PROTEIN)  1,000 mL Per Tube Q24H  . folic acid  1 mg Per Tube Daily  . hydrocortisone sodium succinate  50 mg Intravenous Q12H  . insulin aspart  0-9 Units Subcutaneous Q4H  . mouth rinse  15 mL Mouth Rinse 10 times per day  . nicotine  7 mg Transdermal Daily  . pantoprazole (PROTONIX) IV  40 mg Intravenous Q24H  . polyethylene glycol  17 g Oral Daily  . thiamine  100 mg Oral Daily   Or  . thiamine  100 mg Intravenous Daily  . valproic acid  500 mg Per Tube BID   Continuous Infusions: . sodium chloride 10 mL/hr at 01/29/20 1401  .  ceFAZolin (ANCEF) IV 200 mL/hr at 01/30/20 0630  . heparin 1,650 Units/hr (01/30/20 0855)  . lactated ringers Stopped (01/30/20 0601)  . midazolam 0.5 mg/hr (01/30/20 0630)     LOS: 4 days    Time spent: 35 minutes    Darlena Koval D Manuella Ghazi, DO [facility]  If 7PM-7AM, please contact night-coverage [email or web address] 01/30/2020, 10:13 AM

## 2020-01-31 ENCOUNTER — Inpatient Hospital Stay (HOSPITAL_COMMUNITY): Payer: Medicaid Other

## 2020-01-31 LAB — BASIC METABOLIC PANEL
Anion gap: 9 (ref 5–15)
BUN: 51 mg/dL — ABNORMAL HIGH (ref 8–23)
CO2: 35 mmol/L — ABNORMAL HIGH (ref 22–32)
Calcium: 8.9 mg/dL (ref 8.9–10.3)
Chloride: 98 mmol/L (ref 98–111)
Creatinine, Ser: 0.51 mg/dL (ref 0.44–1.00)
GFR calc Af Amer: >60 mL/min (ref >60)
GFR calc non Af Amer: >60 mL/min (ref >60)
Glucose, Bld: 192 mg/dL — ABNORMAL HIGH (ref 70–99)
Potassium: 3.4 mmol/L — ABNORMAL LOW (ref 3.5–5.1)
Sodium: 142 mmol/L (ref 135–145)

## 2020-01-31 LAB — TRIGLYCERIDES: Triglycerides: 230 mg/dL — ABNORMAL HIGH (ref <150)

## 2020-01-31 LAB — CBC
HCT: 40 % (ref 36.0–46.0)
Hemoglobin: 11.8 g/dL — ABNORMAL LOW (ref 12.0–15.0)
MCH: 28.2 pg (ref 26.0–34.0)
MCHC: 29.5 g/dL — ABNORMAL LOW (ref 30.0–36.0)
MCV: 95.5 fL (ref 80.0–100.0)
Platelets: 136 10*3/uL — ABNORMAL LOW (ref 150–400)
RBC: 4.19 MIL/uL (ref 3.87–5.11)
RDW: 16.2 % — ABNORMAL HIGH (ref 11.5–15.5)
WBC: 11 10*3/uL — ABNORMAL HIGH (ref 4.0–10.5)
nRBC: 0 % (ref 0.0–0.2)

## 2020-01-31 LAB — GLUCOSE, CAPILLARY
Glucose-Capillary: 166 mg/dL — ABNORMAL HIGH (ref 70–99)
Glucose-Capillary: 166 mg/dL — ABNORMAL HIGH (ref 70–99)
Glucose-Capillary: 180 mg/dL — ABNORMAL HIGH (ref 70–99)
Glucose-Capillary: 139 mg/dL — ABNORMAL HIGH (ref 70–99)
Glucose-Capillary: 138 mg/dL — ABNORMAL HIGH (ref 70–99)
Glucose-Capillary: 170 mg/dL — ABNORMAL HIGH (ref 70–99)
Glucose-Capillary: 185 mg/dL — ABNORMAL HIGH (ref 70–99)

## 2020-01-31 LAB — HEPARIN LEVEL (UNFRACTIONATED)
Heparin Unfractionated: 0.16 IU/mL — ABNORMAL LOW (ref 0.30–0.70)
Heparin Unfractionated: 0.26 IU/mL — ABNORMAL LOW (ref 0.30–0.70)
Heparin Unfractionated: 0.36 IU/mL (ref 0.30–0.70)

## 2020-01-31 MED ORDER — ACETYLCYSTEINE 10 % IN SOLN
2.0000 mL | Freq: Four times a day (QID) | RESPIRATORY_TRACT | Status: DC
  Filled 2020-01-31 (×12): qty 4

## 2020-01-31 MED ORDER — HEPARIN BOLUS VIA INFUSION
1500.0000 [IU] | Freq: Once | INTRAVENOUS | Status: AC
  Administered 2020-01-31: 1500 [IU] via INTRAVENOUS
  Filled 2020-01-31: qty 1500

## 2020-01-31 MED ORDER — HEPARIN BOLUS VIA INFUSION
1000.0000 [IU] | Freq: Once | INTRAVENOUS | Status: AC
  Administered 2020-01-31: 1000 [IU] via INTRAVENOUS
  Filled 2020-01-31: qty 1000

## 2020-01-31 MED ORDER — ACETYLCYSTEINE 20 % IN SOLN
1.0000 mL | Freq: Four times a day (QID) | RESPIRATORY_TRACT | Status: DC
  Administered 2020-01-31 – 2020-02-01 (×2): 1 mL via RESPIRATORY_TRACT
  Filled 2020-01-31 (×2): qty 4

## 2020-01-31 MED ORDER — CLONIDINE HCL 0.2 MG PO TABS
0.2000 mg | ORAL_TABLET | Freq: Three times a day (TID) | ORAL | Status: DC
  Administered 2020-01-31 – 2020-02-21 (×39): 0.2 mg
  Filled 2020-01-31 (×45): qty 1

## 2020-01-31 MED ORDER — POTASSIUM CHLORIDE 10 MEQ/100ML IV SOLN
10.0000 meq | INTRAVENOUS | Status: AC
  Administered 2020-01-31 (×4): 10 meq via INTRAVENOUS
  Filled 2020-01-31 (×4): qty 100

## 2020-01-31 NOTE — Progress Notes (Signed)
PROGRESS NOTE    [name] [name]  QMV:[id number] DOB: 1955/10/10 DOA: 01/26/2020 PCP: Leonard Downing, MD   Brief Narrative:  Per HPI: [name] [name] a 64 y.o.femalewith medical history significant forCOPD on 2L [place] at bedtime, hypertension, type 2 diabetes, obesity, bipolar disorder/anxiety disorder, ongoing tobacco use,questionable ongoing alcohol abuse,and prior polysubstance abuse with methamphetamine and alcohol who presented to the ED with progressive worsening of pain, erythema, and serosanguineous drainage from her right second toe. This began to gradually develop after her daughter trimmed her nail too close to her skin approximately 2 weeks ago. She has noticed some fever, chills, body aches, and mild headache as well as general malaise at home. She tried soaking her foot at home with no improvement in her symptoms. She denies any chest pain, shortness of breath, abdominal pain, nausea, vomiting, or dysuria. She denies any upper respiratory symptoms as well. She has not been vaccinated for COVID-19.  ED Course:Vital signs demonstrate ongoing sinus tachycardia and this is confirmed on EKG with heart rate approximately 110 bpm. Temperature 1-1.5 Fahrenheit. She is noted to have a leukocytosis of 12,900 as well as a thrombocytopenia of 129,000. BUN is 38 and creatinine is elevated at 1.1 with baseline 0.3-0.5. Chest x-ray with mild cardiomegaly and no other acute findings. Covid testing negative. Right foot x-ray with no soft tissue swelling or signs of osteomyelitis noted. Lactic acid is 1.5. Patient has been ordered and started 3 L fluid bolus as well as vancomycin and Rocephin for suspected sepsis secondary to cellulitis.  9/8:Patient remains comfortable on ventilator and is noted to be in sinus rhythm this morning. Plan to discontinue IV Cardizem drip and transition to oral as ordered. 2D echocardiogram with LVEF 55-60% with no vegetations noted. She is  being treated with Ancef for group A strep bacteremia related to cellulitis. Plan to consult cardiology today for planning of TEE. Start tube feedings as well. Appreciate further PCCM recommendations.  9/9: Patient remains comfortable on the ventilator and at 40% FiO2.  Patient remains in sinus rhythm with Cardizem as ordered by cardiology.  Plans for TEE once patient stable and off ventilator.  9/10: Patient is responsive this morning on the ventilator at FiO2 40%. She continues to remain on sinus rhythm with heart rate control that appears adequate. Trial of extubation today per PCCM. Continue current antibiotics.  Assessment & Plan:   Active Problems:   Acute respiratory failure with hypoxia and hypercapnia (HCC)   Sepsis (HCC)   Cellulitis of right lower leg   1-sepsis: In the setting of Streptococcus cellulitis/bacteremia. Present on admission -Patient met sepsis criteria on admission with sinus tachycardia, elevated white blood cells, febrile and with identified source of infection of cellulitic process. She had acute kidney injury as organ dysfunction. -No signs of osteomyelitis onfootchest x-ray -Blood culture positive for Streptococcus -Initially started on vancomycinwhich has been DC since MRSA negative -Continue to follow ESR and CRP -Continue on Ancefbased on sensitivities -Transthoracic echocardiogram with LVEF 55 to 60% and no vegetations noted, appreciate cardiology consultation for TEE once extubated -Continue as needed antipyretics and supportive care.  2-acute on chronic respiratory failure with hypoxia and hypercapnias/p intubation -Appears to be a combination of COPD exacerbation, Underlying obesity hypoventilation syndrome and component of interstitial pulmonary edema/vascular congestion after fluid resuscitation as per sepsis protocol. -Hold further lasix -PCCM following with likely trial of extubation today.  3-acute kidney injury-resolved -Baseline  creatinine 0.6-0.7, currently around 0.5 -Continue tube feeds and fluid resuscitation  4-mild thrombocytopenia-stable/improving -Likely  related to acute sepsis and underlying history of alcohol abuse -No signs of overt bleeding -Monitor carefully while on heparin drip -Repeat CBC in a.m.  5-alcohol abuse -Continue CIWA monitoring -Continue thiamine and folic acid.  6-class II obesity  -Body mass index is 36.58 kg/m. -Low calorie diet, portion control and increase physical activity discussed with patient.  7-history of hypertension -Holding home amlodipine for now -Hold further Lasix -Hydrocortisone discontinued. -Continue oral Cardizem -Labetalol as needed  8-history of tobacco abuse -Cessation counseling has been provided. -Continue nicotine patch.  9-depression/anxiety/history of seizures -Plan is to resume psychotropic home medications once able to take by mouth. -Continue Depakote  10-type 2 diabetes mellitus -A1c 5.8 -While inpatient continue sliding scale insulin -At discharge will resume the use of Metformin in order to continue assisting with management of insulin resistant and obesity. -Modified carbohydrate diet and lifestylerecommended after discharge.  11-new onset atrial fibrillation with RVR-currently in sinus rhythm -Continue oral Cardizem as ordered -TSH 0.294, check free T4 and T3 -2D echocardiogram with LVEF 55-60% -CHA2DS2-VASc score of 3 for which I will start heparin drip -Appreciate cardiology recommendations for eventual TEE once extubated  12-Hypokalemia-mild -supplement -Monitor  DVT prophylaxis:Heparin drip Code Status:Full Family Communication:Discussed with daughter at bedside 9/10 Disposition Plan: Status is: Inpatient  Remains inpatient appropriate because:Ongoing diagnostic testing needed not appropriate for outpatient work up, IV treatments appropriate due to intensity of illness or inability to take PO and  Inpatient level of care appropriate due to severity of illness   Dispo: The patient is from:Home Anticipated d/c is to:SNF Anticipated d/c date is: > 3 days Patient currently is not medically stable to d/c.Patient currently remains intubated on the ventilator and will need further evaluation per cardiology along with ongoing evaluation per PCCM.  Consultants:  PCCM  Cardiology  Procedures:  2D echocardiogram 9/8 with LVEF 55-60%. No vegetations.  Antimicrobials:  Anti-infectives (From admission, onward)   Start     Dose/Rate Route Frequency Ordered Stop   01/27/20 1500  vancomycin (VANCOREADY) IVPB 1500 mg/300 mL  Status:  Discontinued        1,500 mg 150 mL/hr over 120 Minutes Intravenous Every 24 hours 01/26/20 1428 01/27/20 0827   01/27/20 0830  ceFAZolin (ANCEF) IVPB 2g/100 mL premix        2 g 200 mL/hr over 30 Minutes Intravenous Every 8 hours 01/27/20 0827     01/26/20 1545  vancomycin (VANCOCIN) IVPB 1000 mg/200 mL premix  Status:  Discontinued        1,000 mg 200 mL/hr over 60 Minutes Intravenous  Once 01/26/20 1541 01/26/20 1553   01/26/20 1415  vancomycin (VANCOCIN) IVPB 1000 mg/200 mL premix  Status:  Discontinued        1,000 mg 200 mL/hr over 60 Minutes Intravenous  Once 01/26/20 1408 01/26/20 1413   01/26/20 1415  cefTRIAXone (ROCEPHIN) 2 g in sodium chloride 0.9 % 100 mL IVPB        2 g 200 mL/hr over 30 Minutes Intravenous  Once 01/26/20 1408 01/26/20 2011   01/26/20 1415  vancomycin (VANCOREADY) IVPB 2000 mg/400 mL        2,000 mg 200 mL/hr over 120 Minutes Intravenous  Once 01/26/20 1413 01/26/20 2011      Subjective: Patient seen and evaluated today with no new acute complaints or concerns. No acute concerns or events noted overnight. She remains on the ventilator and is responsive this morning and appears more alert.  Objective: Vitals:   01/31/20 0600 01/31/20  0700 01/31/20 0800 01/31/20 0900  BP:  139/67 (!) 150/72 (!) 162/77 (!) 150/78  Pulse: 82 87 94 88  Resp: (!) 28 (!) 23 (!) 23 (!) 21  Temp:   98.6 F (37 C)   TempSrc:   Rectal   SpO2: 91% 92% 93% 93%  Weight:      Height:        Intake/Output Summary (Last 24 hours) at 01/31/2020 0928 Last data filed at 01/31/2020 0849 Gross per 24 hour  Intake 3919.33 ml  Output 1050 ml  Net 2869.33 ml   Filed Weights   01/29/20 0400 01/30/20 0500 01/31/20 0500  Weight: 98.8 kg 100.2 kg 100.8 kg    Examination:  General exam: Appears calm and comfortable  Respiratory system: Clear to auscultation. Respiratory effort normal. Intubated on ventilator FiO2 40% Cardiovascular system: S1 & S2 heard, RRR.  Gastrointestinal system: Abdomen is nondistended, soft and nontender. Central nervous system: Alert and awake Extremities: Skin changes as prior, no edema; erythema to right lower extremity as well as ecchymosis to right second toe. Psychiatry: Difficult to assess    Data Reviewed: I have personally reviewed following labs and imaging studies  CBC: Recent Labs  Lab 01/26/20 1350 01/26/20 1350 01/27/20 0829 01/28/20 0404 01/29/20 0908 01/30/20 0505 01/31/20 0607  WBC 12.9*   < > 10.2 12.3* 12.6* 14.2* 11.0*  NEUTROABS 11.3*  --   --   --   --   --   --   HGB 15.3*   < > 13.7 14.3 13.4 12.7 11.8*  HCT 48.8*   < > 46.5* 48.9* 44.6 42.3 40.0  MCV 92.4   < > 96.3 96.6 94.1 94.4 95.5  PLT 129*   < > 97* 103* 103* 113* 136*   < > = values in this interval not displayed.   Basic Metabolic Panel: Recent Labs  Lab 01/27/20 0829 01/27/20 0829 01/28/20 0404 01/29/20 2449 01/29/20 0908 01/29/20 1632 01/30/20 0505 01/30/20 1512 01/31/20 0607  NA 140  --  142  --  143  --  140  --  142  K 4.3  --  3.7  --  3.0*  --  3.5  --  3.4*  CL 103  --  99  --  96*  --  97*  --  98  CO2 27  --  30  --  34*  --  34*  --  35*  GLUCOSE 132*  --  171*  --  152*  --  204*  --  192*  BUN 40*  --  36*  --  63*  --  72*  --  51*    CREATININE 0.75   < > 0.61 1.07* 1.03*  --  0.62  --  0.51  CALCIUM 8.2*  --  9.0  --  8.8*  --  8.9  --  8.9  MG 1.9   < > 2.2  --  2.3 2.4 2.2 2.1  --   PHOS  --   --   --   --  2.3* 1.9* 1.9* 2.3*  --    < > = values in this interval not displayed.   GFR: Estimated Creatinine Clearance: 84.7 mL/min (by C-G formula based on SCr of 0.51 mg/dL). Liver Function Tests: Recent Labs  Lab 01/26/20 1350 01/27/20 0829  AST 14* 12*  ALT 17 14  ALKPHOS 96 86  BILITOT 0.5 0.2*  PROT 7.8 6.7  ALBUMIN 3.7 2.9*   No results  for input(s): LIPASE, AMYLASE in the last 168 hours. No results for input(s): AMMONIA in the last 168 hours. Coagulation Profile: Recent Labs  Lab 01/26/20 1350 01/27/20 0829  INR 1.1 1.2   Cardiac Enzymes: No results for input(s): CKTOTAL, CKMB, CKMBINDEX, TROPONINI in the last 168 hours. BNP (last 3 results) No results for input(s): PROBNP in the last 8760 hours. HbA1C: No results for input(s): HGBA1C in the last 72 hours. CBG: Recent Labs  Lab 01/30/20 1626 01/30/20 2010 01/31/20 0122 01/31/20 0448 01/31/20 0843  GLUCAP 128* 146* 166* 166* 180*   Lipid Profile: Recent Labs    01/30/20 0505 01/31/20 0607  TRIG 313* 230*   Thyroid Function Tests: Recent Labs    01/29/20 0908 01/29/20 1632  TSH 0.294*  --   FREET4  --  0.75  T3FREE  --  0.7*   Anemia Panel: No results for input(s): VITAMINB12, FOLATE, FERRITIN, TIBC, IRON, RETICCTPCT in the last 72 hours. Sepsis Labs: Recent Labs  Lab 01/26/20 1350 01/26/20 1611 01/27/20 0829  PROCALCITON  --   --  3.34  LATICACIDVEN 1.5 0.8 0.8    Recent Results (from the past 240 hour(s))  SARS Coronavirus 2 by RT PCR (hospital order, performed in [facility] hospital lab) Nasopharyngeal Nasopharyngeal Swab     Status: None   Collection Time: 01/26/20  1:17 PM   Specimen: Nasopharyngeal Swab  Result Value Ref Range Status   SARS Coronavirus 2 NEGATIVE NEGATIVE Final    Comment:  (NOTE) SARS-CoV-2 target nucleic acids are NOT DETECTED.  The SARS-CoV-2 RNA is generally detectable in upper and lower respiratory specimens during the acute phase of infection. The lowest concentration of SARS-CoV-2 viral copies this assay can detect is 250 copies / mL. A negative result does not preclude SARS-CoV-2 infection and should not be used as the sole basis for treatment or other patient management decisions.  A negative result may occur with improper specimen collection / handling, submission of specimen other than nasopharyngeal swab, presence of viral mutation(s) within the areas targeted by this assay, and inadequate number of viral copies (<250 copies / mL). A negative result must be combined with clinical observations, patient history, and epidemiological information.  Fact Sheet for Patients:   StrictlyIdeas.no  Fact Sheet for Healthcare Providers: BankingDealers.co.za  This test is not yet approved or  cleared by the [place] FDA and has been authorized for detection and/or diagnosis of SARS-CoV-2 by FDA under an Emergency Use Authorization (EUA).  This EUA will remain in effect (meaning this test can be used) for the duration of the COVID-19 declaration under Section 564(b)(1) of the Act, 21 U.S.C. section 360bbb-3(b)(1), unless the authorization is terminated or revoked sooner.  Performed at [facility], [address], [place], [place] [address]   Blood Culture (routine x 2)     Status: Abnormal   Collection Time: 01/26/20  1:50 PM   Specimen: Right Antecubital; Blood  Result Value Ref Range Status   Specimen Description   Final    RIGHT ANTECUBITAL BOTTLES DRAWN AEROBIC AND ANAEROBIC Performed at [facility], [address], [place], [place] [address]    Special Requests   Final    Blood Culture adequate volume Performed at [facility], [address], [place], [place] [address]    Culture  Setup Time    Final    GRAM POSITIVE COCCI Gram Stain Report Called to,Read Back By and Verified With: KILMER,T @ 0626 ON 01/27/20 BY JUW IN BOTH AEROBIC  AND ANAEROBIC BOTTLES GS DONE @ [facility] Organism ID to follow Performed at [facility], [place] [address], [place], [place] [address]    Culture GROUP A STREP (S.PYOGENES) ISOLATED (A)  Final   Report Status 01/29/2020 FINAL  Final   Organism ID, Bacteria GROUP A STREP (S.PYOGENES) ISOLATED  Final      Susceptibility   Group a strep (s.pyogenes) isolated - MIC*    PENICILLIN <=0.06 SENSITIVE Sensitive     CEFTRIAXONE <=0.12 SENSITIVE Sensitive     ERYTHROMYCIN >=8 RESISTANT Resistant     LEVOFLOXACIN 0.5 SENSITIVE Sensitive     VANCOMYCIN <=0.12 SENSITIVE Sensitive     * GROUP A STREP (S.PYOGENES) ISOLATED  Blood Culture ID Panel (Reflexed)     Status: Abnormal   Collection Time: 01/26/20  1:50 PM  Result Value Ref Range Status   Enterococcus faecalis NOT DETECTED NOT DETECTED Final   Enterococcus Faecium NOT DETECTED NOT DETECTED Final   Listeria monocytogenes NOT DETECTED NOT DETECTED Final   Staphylococcus species NOT DETECTED NOT DETECTED Final   Staphylococcus aureus (BCID) NOT DETECTED NOT DETECTED Final   Staphylococcus epidermidis NOT DETECTED NOT DETECTED Final   Staphylococcus lugdunensis NOT DETECTED NOT DETECTED Final   Streptococcus species DETECTED (A) NOT DETECTED Final    Comment: CRITICAL RESULT CALLED TO, READ BACK BY AND VERIFIED WITH: G. Coffee PharmD 8:25 01/27/20 ([email or web address])    Streptococcus agalactiae NOT DETECTED NOT DETECTED Final   Streptococcus pneumoniae NOT DETECTED NOT DETECTED Final   Streptococcus pyogenes DETECTED (A) NOT DETECTED Final    Comment: CRITICAL RESULT CALLED TO, READ BACK BY AND VERIFIED WITH: G. Coffee PharmD 8:25 01/27/20 ([email or web address])    A.calcoaceticus-baumannii NOT DETECTED NOT DETECTED Final   Bacteroides fragilis NOT DETECTED NOT DETECTED Final   Enterobacterales NOT DETECTED NOT DETECTED Final    Enterobacter cloacae complex NOT DETECTED NOT DETECTED Final   Escherichia coli NOT DETECTED NOT DETECTED Final   Klebsiella aerogenes NOT DETECTED NOT DETECTED Final   Klebsiella oxytoca NOT DETECTED NOT DETECTED Final   Klebsiella pneumoniae NOT DETECTED NOT DETECTED Final   Proteus species NOT DETECTED NOT DETECTED Final   Salmonella species NOT DETECTED NOT DETECTED Final   Serratia marcescens NOT DETECTED NOT DETECTED Final   Haemophilus influenzae NOT DETECTED NOT DETECTED Final   Neisseria meningitidis NOT DETECTED NOT DETECTED Final   Pseudomonas aeruginosa NOT DETECTED NOT DETECTED Final   Stenotrophomonas maltophilia NOT DETECTED NOT DETECTED Final   Candida albicans NOT DETECTED NOT DETECTED Final   Candida auris NOT DETECTED NOT DETECTED Final   Candida glabrata NOT DETECTED NOT DETECTED Final   Candida krusei NOT DETECTED NOT DETECTED Final   Candida parapsilosis NOT DETECTED NOT DETECTED Final   Candida tropicalis NOT DETECTED NOT DETECTED Final   Cryptococcus neoformans/gattii NOT DETECTED NOT DETECTED Final    Comment: Performed at [facility], [place] [address] [place], [place] [address]  Blood Culture (routine x 2)     Status: Abnormal   Collection Time: 01/26/20  2:00 PM   Specimen: Left Antecubital; Blood  Result Value Ref Range Status   Specimen Description   Final    LEFT ANTECUBITAL BOTTLES DRAWN AEROBIC AND ANAEROBIC Performed at [facility], [address] [place], [place] [address]    Special Requests   Final    Blood Culture adequate volume Performed at [facility], [address] [place], [place]  [address]    Culture  Setup Time   Final  GRAM POSITIVE COCCI Gram Stain Report Called to,Read Back By and Verified With: KILMER,T @ 6378 ON 01/27/20 BY JUW AEROBIC BOTTLE ONLY GS DONE @ [facility] Performed at [facility], [address], [place], [place] [address]    Culture (A)  Final    GROUP A STREP (S.PYOGENES) ISOLATED SUSCEPTIBILITIES PERFORMED  ON PREVIOUS CULTURE WITHIN THE LAST 5 DAYS. Performed at [facility], [place] [address], [place], [place] [address]    Report Status 01/29/2020 FINAL  Final  Culture, blood (Routine X 2) w Reflex to ID Panel     Status: None (Preliminary result)   Collection Time: 01/28/20  1:01 PM   Specimen: BLOOD RIGHT HAND  Result Value Ref Range Status   Specimen Description BLOOD RIGHT HAND  Final   Special Requests   Final    BOTTLES DRAWN AEROBIC ONLY Blood Culture results may not be optimal due to an inadequate volume of blood received in culture bottles   Culture   Final    NO GROWTH 2 DAYS Performed at [facility], [address] [place], [place] [address]    Report Status PENDING  Incomplete  Culture, blood (Routine X 2) w Reflex to ID Panel     Status: None (Preliminary result)   Collection Time: 01/28/20  1:01 PM   Specimen: BLOOD RIGHT WRIST  Result Value Ref Range Status   Specimen Description BLOOD RIGHT WRIST  Final   Special Requests   Final    BOTTLES DRAWN AEROBIC AND ANAEROBIC Blood Culture adequate volume   Culture   Final    NO GROWTH 2 DAYS Performed at [facility], [address] [place], [place] [address]    Report Status PENDING  Incomplete  MRSA PCR Screening     Status: None   Collection Time: 01/29/20 11:17 AM   Specimen: Nasal Mucosa; Nasopharyngeal  Result Value Ref Range Status   MRSA by PCR NEGATIVE NEGATIVE Final    Comment:        The GeneXpert MRSA Assay (FDA approved for NASAL specimens only), is one component of a comprehensive MRSA colonization surveillance program. It is not intended to diagnose MRSA infection nor to guide or monitor treatment for MRSA infections. Performed at [facility], [address] [place], [place] [address]          Radiology Studies: [facility] Chest [facility] 1 View  Result Date: 01/31/2020 CLINICAL DATA:  Respiratory failure. EXAM: PORTABLE CHEST 1 VIEW COMPARISON:  01/29/2020. FINDINGS: Endotracheal tube and NG tube  in stable position. Cardiomegaly with pulmonary venous congestion. Bilateral interstitial prominence again noted. This could be secondary to CHF and or pneumonitis. Tiny left pleural effusion cannot be excluded. No pneumothorax. IMPRESSION: 1. Lines and tubes in stable position. 2. Cardiomegaly with pulmonary venous congestion. Bilateral interstitial prominence again noted. This could be secondary to interstitial edema and or pneumonitis. Tiny left pleural effusion cannot be excluded. Electronically Signed   By: Marcello Moores  Register   On: 01/31/2020 05:15   DG CHEST PORT 1 VIEW  Result Date: 01/29/2020 CLINICAL DATA:  Nasogastric tube placement EXAM: PORTABLE CHEST 1 VIEW COMPARISON:  Yesterday FINDINGS: Nasogastric tube tip reaches the stomach at least. The side port is not visualized on this study. The endotracheal tube tip is at the clavicular heads. Patchy infiltrate at the left base. Interstitial coarsening on both sides. Borderline heart size, stable. No visible effusion or pneumothorax. IMPRESSION: 1. Unremarkable hardware positioning. 2. Left base infiltrate. Electronically Signed   By: Angelica Chessman  Watts M.D.   On: 01/29/2020 11:41        Scheduled Meds:  arformoterol  15 mcg Nebulization BID   benztropine  0.5 mg Per Tube BID   budesonide (PULMICORT) nebulizer solution  0.5 mg Nebulization BID   chlorhexidine gluconate (MEDLINE KIT)  15 mL Mouth Rinse BID   Chlorhexidine Gluconate Cloth  6 each Topical Daily   cloNIDine  0.1 mg Per Tube TID   diltiazem  60 mg Per Tube Q6H   docusate  100 mg Per Tube BID   feeding supplement (VITAL HIGH PROTEIN)  1,000 mL Per Tube N53X   folic acid  1 mg Per Tube Daily   insulin aspart  0-9 Units Subcutaneous Q4H   mouth rinse  15 mL Mouth Rinse 10 times per day   nicotine  7 mg Transdermal Daily   pantoprazole (PROTONIX) IV  40 mg Intravenous Q24H   polyethylene glycol  17 g Oral Daily   thiamine  100 mg Oral Daily   Or   thiamine  100  mg Intravenous Daily   valproic acid  500 mg Per Tube BID   Continuous Infusions:  sodium chloride 10 mL/hr at 01/29/20 1401    ceFAZolin (ANCEF) IV Stopped (01/31/20 0556)   heparin 2,100 Units/hr (01/31/20 0828)   lactated ringers 75 mL/hr at 01/31/20 0828   midazolam 2 mg/hr (01/31/20 0828)     LOS: 5 days    Time spent: 35 minutes    Myeisha Kruser D Manuella Ghazi, DO [facility]  If 7PM-7AM, please contact night-coverage [email or web address] 01/31/2020, 9:28 AM

## 2020-01-31 NOTE — Progress Notes (Signed)
ANTICOAGULATION CONSULT NOTE - Follow Up Consult  Pharmacy Consult for Heparin Indication: atrial fibrillation  Allergies  Allergen Reactions  . Bactrim [Sulfamethoxazole-Trimethoprim] Anaphylaxis  . Amoxicillin Hives  . Diclofenac Tinitus    swelling in feet  . Morphine And Related Nausea And Vomiting and Other (See Comments)    headache  . Potassium-Containing Compounds Itching    Patient Measurements: Height: 5\' 5"  (165.1 cm) Weight: 100.8 kg (222 lb 3.6 oz) IBW/kg (Calculated) : 57 Heparin Dosing Weight:  80 kg  Vital Signs: Temp: 98.6 F (37 C) (09/10 0800) Temp Source: Rectal (09/10 0800) BP: 150/78 (09/10 0900) Pulse Rate: 88 (09/10 0900)  Labs: Recent Labs    01/29/20 0908 01/29/20 2025 01/30/20 0505 01/30/20 0505 01/30/20 1512 01/31/20 0009 01/31/20 0607 01/31/20 1123  HGB 13.4  --  12.7  --   --   --  11.8*  --   HCT 44.6  --  42.3  --   --   --  40.0  --   PLT 103*  --  113*  --   --   --  136*  --   HEPARINUNFRC  --    < > <0.10*   < > <0.10* 0.16*  --  0.26*  CREATININE 1.03*  --  0.62  --   --   --  0.51  --    < > = values in this interval not displayed.    Estimated Creatinine Clearance: 84.7 mL/min (by C-G formula based on SCr of 0.51 mg/dL).  Assessment:Pharmacy consulted to dose heparin in patient with atrial fibrillation.  Patient is not on anticoagulation prior to admission. Anticoag: Afib. Baseline CBC WNL. Started IV heparin.  Heparin level up to 0.26 after heparin bolus and increase still not therapeutic  Goal of Therapy:  Heparin level 0.3-0.7 units/ml Monitor platelets by anticoagulation protocol: Yes   Plan:  Give 1000 units bolus x 1  Increase heparin infusion to 2250 units/hr HL in 6 hours and daily Plan DOAC at discharge.  Isac Sarna, BS [place], BCPS Clinical Pharmacist Pager [phone]

## 2020-01-31 NOTE — Progress Notes (Signed)
Progress Note  Patient Name: [name] [name] Date of Encounter: 01/31/2020  Primary Cardiologist: Carlyle Dolly, MD  Subjective   Patient remains intubated and on the ventilator, sedated.  I spoke with her daughter in the room this morning.  Inpatient Medications    Scheduled Meds: . arformoterol  15 mcg Nebulization BID  . benztropine  0.5 mg Per Tube BID  . budesonide (PULMICORT) nebulizer solution  0.5 mg Nebulization BID  . chlorhexidine gluconate (MEDLINE KIT)  15 mL Mouth Rinse BID  . Chlorhexidine Gluconate Cloth  6 each Topical Daily  . cloNIDine  0.1 mg Per Tube TID  . diltiazem  60 mg Per Tube Q6H  . docusate  100 mg Per Tube BID  . feeding supplement (VITAL HIGH PROTEIN)  1,000 mL Per Tube Q24H  . folic acid  1 mg Per Tube Daily  . insulin aspart  0-9 Units Subcutaneous Q4H  . mouth rinse  15 mL Mouth Rinse 10 times per day  . nicotine  7 mg Transdermal Daily  . pantoprazole (PROTONIX) IV  40 mg Intravenous Q24H  . polyethylene glycol  17 g Oral Daily  . thiamine  100 mg Oral Daily   Or  . thiamine  100 mg Intravenous Daily  . valproic acid  500 mg Per Tube BID   Continuous Infusions: . sodium chloride 10 mL/hr at 01/29/20 1401  .  ceFAZolin (ANCEF) IV 2 g (01/31/20 0526)  . heparin 2,100 Units/hr (01/31/20 0310)  . lactated ringers 75 mL/hr at 01/30/20 1513  . midazolam 2 mg/hr (01/30/20 2230)   PRN Meds: sodium chloride, acetaminophen **OR** acetaminophen, fentaNYL (SUBLIMAZE) injection, fentaNYL (SUBLIMAZE) injection, labetalol, levalbuterol, ondansetron **OR** ondansetron (ZOFRAN) IV   Vital Signs    Vitals:   01/31/20 0500 01/31/20 0600 01/31/20 0700 01/31/20 0800  BP: (!) 146/73 139/67 (!) 150/72 (!) 162/77  Pulse: 85 82 87 94  Resp: (!) 26 (!) 28 (!) 23 (!) 23  Temp:      TempSrc:      SpO2: 93% 91% 92% 93%  Weight: 100.8 kg     Height:        Intake/Output Summary (Last 24 hours) at 01/31/2020 0828 Last data filed at 01/30/2020  1927 Gross per 24 hour  Intake 1046.3 ml  Output 1050 ml  Net -3.7 ml   Filed Weights   01/29/20 0400 01/30/20 0500 01/31/20 0500  Weight: 98.8 kg 100.2 kg 100.8 kg    Telemetry    Sinus rhythm.  Personally reviewed.  ECG    An ECG dated January 26, 2020 was personally reviewed today and demonstrated:  Sinus tachycardia with LVH.  Physical Exam   GEN:  No distress, intubated and on ventilator.   Neck: No obvious JVD. Cardiac: RRR, no gallop.  Respiratory: Nonlabored.  Mild anterior expiratory wheeze. GI: Soft, nontender, bowel sounds present. MS:  Erythema right lower leg as before, consistent with cellulitis. Neuro:   Sedated.  Labs    Chemistry Recent Labs  Lab 01/26/20 1350 01/26/20 1350 01/27/20 0829 01/28/20 0404 01/29/20 0908 01/30/20 0505 01/31/20 0607  NA 137   < > 140   < > 143 140 142  K 4.1   < > 4.3   < > 3.0* 3.5 3.4*  CL 97*   < > 103   < > 96* 97* 98  CO2 28   < > 27   < > 34* 34* 35*  GLUCOSE 166*   < > 132*   < >  152* 204* 192*  BUN 38*   < > 40*   < > 63* 72* 51*  CREATININE 1.10*   < > 0.75   < > 1.03* 0.62 0.51  CALCIUM 9.0   < > 8.2*   < > 8.8* 8.9 8.9  PROT 7.8  --  6.7  --   --   --   --   ALBUMIN 3.7  --  2.9*  --   --   --   --   AST 14*  --  12*  --   --   --   --   ALT 17  --  14  --   --   --   --   ALKPHOS 96  --  86  --   --   --   --   BILITOT 0.5  --  0.2*  --   --   --   --   GFRNONAA 53*   < > >60   < > 58* >60 >60  GFRAA >60   < > >60   < > >60 >60 >60  ANIONGAP 12   < > 10   < > _0 < > = values in this interval not displayed.     Hematology Recent Labs  Lab 01/29/20 0908 01/30/20 0505 01/31/20 0607  WBC 12.6* 14.2* 11.0*  RBC 4.74 4.48 4.19  HGB 13.4 12.7 11.8*  HCT 44.6 42.3 40.0  MCV 94.1 94.4 95.5  MCH 28.3 28.3 28.2  MCHC 30.0 30.0 29.5*  RDW 15.6* 16.0* 16.2*  PLT 103* 113* 136*    Radiology    DG Chest Port 1 View  Result Date: 01/31/2020 CLINICAL DATA:  Respiratory failure. EXAM:  PORTABLE CHEST 1 VIEW COMPARISON:  01/29/2020. FINDINGS: Endotracheal tube and NG tube in stable position. Cardiomegaly with pulmonary venous congestion. Bilateral interstitial prominence again noted. This could be secondary to CHF and or pneumonitis. Tiny left pleural effusion cannot be excluded. No pneumothorax. IMPRESSION: 1. Lines and tubes in stable position. 2. Cardiomegaly with pulmonary venous congestion. Bilateral interstitial prominence again noted. This could be secondary to interstitial edema and or pneumonitis. Tiny left pleural effusion cannot be excluded. Electronically Signed   By: Marcello Moores  Register   On: 01/31/2020 05:15   DG CHEST PORT 1 VIEW  Result Date: 01/29/2020 CLINICAL DATA:  Nasogastric tube placement EXAM: PORTABLE CHEST 1 VIEW COMPARISON:  Yesterday FINDINGS: Nasogastric tube tip reaches the stomach at least. The side port is not visualized on this study. The endotracheal tube tip is at the clavicular heads. Patchy infiltrate at the left base. Interstitial coarsening on both sides. Borderline heart size, stable. No visible effusion or pneumothorax. IMPRESSION: 1. Unremarkable hardware positioning. 2. Left base infiltrate. Electronically Signed   By: Monte Fantasia M.D.   On: 01/29/2020 11:41   ECHOCARDIOGRAM COMPLETE  Result Date: 01/29/2020    ECHOCARDIOGRAM REPORT   Patient Name:   [name] [name] Date of Exam: 01/29/2020 Medical Rec #:  [id number]         Height:       65.0 in Accession #:    [id number]        Weight:       217.8 lb Date of Birth:  12-Oct-1955         BSA:          2.051 m Patient Age:    22 years          BP:  126/66 mmHg Patient Gender: F                 HR:           88 bpm. Exam [facility] Procedure: 2D Echo Indications:    Cardiomegaly 429.3 / I51.7,  History:        Patient has no prior history of Echocardiogram examinations.                 COPD; Risk Factors:Current Smoker, Dyslipidemia, Hypertension                 and Diabetes. Severe  Sepsis, ETOH, Bipolar, sepsis/ gm pos                 bacteremia.  Sonographer:    Jeryl Columbia RDCS (AE) Referring Phys: [id number] CARLOS MADERA IMPRESSIONS  1. Left ventricular ejection fraction, by estimation, is 55 to 60%. The left ventricle has normal function. The left ventricle has no regional wall motion abnormalities. There is mild left ventricular hypertrophy. Left ventricular diastolic parameters are indeterminate.  2. Right ventricular systolic function is normal. The right ventricular size is normal. Tricuspid regurgitation signal is inadequate for assessing PA pressure.  3. Left atrial size was mildly dilated.  4. The mitral valve is abnormal, mildly calcified. Trivial mitral valve regurgitation. Moderate mitral annular calcification.  5. Tricuspid valve is mildly calcified with trivial tricuspid regurgitation.  6. The aortic valve is tricuspid. Aortic valve regurgitation is not visualized. Mild to moderate aortic valve sclerosis/calcification is present, without any evidence of aortic stenosis.  7. The inferior vena cava is normal in size with <50% respiratory variability, suggesting right atrial pressure of 8 mmHg.  8. No definite valvular vegetations. FINDINGS  Left Ventricle: Left ventricular ejection fraction, by estimation, is 55 to 60%. The left ventricle has normal function. The left ventricle has no regional wall motion abnormalities. The left ventricular internal cavity size was normal in size. There is  mild left ventricular hypertrophy. Left ventricular diastolic parameters are indeterminate. Right Ventricle: The right ventricular size is normal. No increase in right ventricular wall thickness. Right ventricular systolic function is normal. Tricuspid regurgitation signal is inadequate for assessing PA pressure. Left Atrium: Left atrial size was mildly dilated. Right Atrium: Right atrial size was normal in size. Pericardium: There is no evidence of pericardial effusion. Mitral Valve: The  mitral valve is abnormal. There is mild thickening of the mitral valve leaflet(s). Moderate mitral annular calcification. Trivial mitral valve regurgitation. Tricuspid Valve: Tricuspid valve is mildly calcified with trivial tricuspid regurgitation. The tricuspid valve is grossly normal. Tricuspid valve regurgitation is trivial. Aortic Valve: The aortic valve is tricuspid. There is mild to moderate aortic valve annular calcification. Aortic valve regurgitation is not visualized. Mild to moderate aortic valve sclerosis/calcification is present, without any evidence of aortic stenosis. Pulmonic Valve: The pulmonic valve was grossly normal. Pulmonic valve regurgitation is trivial. Aorta: The aortic root is normal in size and structure. Venous: The inferior vena cava is normal in size with less than 50% respiratory variability, suggesting right atrial pressure of 8 mmHg. IAS/Shunts: No atrial level shunt detected by color flow Doppler.  LEFT VENTRICLE PLAX 2D LVIDd:         3.71 cm  Diastology LVIDs:         2.08 cm  LV e' medial:    4.87 cm/s LV PW:         1.28 cm  LV E/e' medial:  22.2  LV IVS:        1.23 cm  LV e' lateral:   4.95 cm/s LVOT diam:     2.00 cm  LV E/e' lateral: 21.8 LVOT Area:     3.14 cm  RIGHT VENTRICLE RV S prime:     11.70 cm/s TAPSE (M-mode): 2.7 cm LEFT ATRIUM             Index       RIGHT ATRIUM           Index LA diam:        3.70 cm 1.80 cm/m  RA Area:     20.40 cm LA Vol (A2C):   72.8 ml 35.49 ml/m RA Volume:   66.90 ml  32.62 ml/m LA Vol (A4C):   76.6 ml 37.34 ml/m LA Biplane Vol: 76.9 ml 37.49 ml/m   AORTA Ao Root diam: 2.80 cm MITRAL VALVE MV Area (PHT): 3.99 cm     SHUNTS MV Decel Time: 190 msec     Systemic Diam: 2.00 cm MR Peak grad: 83.2 mmHg MR Vmax:      456.00 cm/s MV E velocity: 108.00 cm/s MV A velocity: 124.00 cm/s MV E/A ratio:  0.87 Rozann Lesches MD Electronically signed by Rozann Lesches MD Signature Date/Time: 01/29/2020/10:08:48 AM    Final     Patient Profile      64 y.o. female with a history of COPD and chronic hypoxic respiratory failure, hypertension, type 2 diabetes mellitus, bipolar disorder and anxiety, and previous polysubstance use.  Assessment & Plan    1.  Transient episode of atrial fibrillation with RVR, spontaneously converted to sinus rhythm.  Occurred in the setting of significant physiologic stressors with sepsis and acute on chronic hypoxic/hypercarbic respiratory failure requiring ventilatory support.  CHA2DS2-VASc score is 3.  Currently on Cardizem 60 mg every 6 hours and IV heparin.  LVEF 55 to 60% with mild left atrial enlargement.  She remains in sinus rhythm without further breakthrough on telemetry.  At this point not certain that she will need long-term anticoagulation.  2.  Sepsis with blood cultures positive for Streptococcus pyogenes.  She does have right leg cellulitis and is currently on Ancef.  She remains afebrile, WBC is coming down.  Echocardiogram did not reveal any obvious valvular vegetations.  TEE requested.  4.  Thrombocytopenia, platelets have come up to 136.  Suspected to be related to sepsis.  5.  Acute on chronic hypoxic/hypercarbic respiratory failure.  She has COPD with tobacco abuse at baseline.  Currently intubated on ventilator with follow-up by PCCM.  Continue supportive measures as per primary team and PCCM.  From a cardiac perspective, she remains on IV heparin and Cardizem 60 mg every 6 hours.  She remains in sinus rhythm, continue to observe.  At this point not entirely clear that she will need long-term anticoagulation.  Will eventually consider TEE once she further stabilizes, she is afebrile with decreasing white blood cell count and is on appropriate antibiotic therapy for identified organism.  Signed, Rozann Lesches, MD  01/31/2020, 8:28 AM

## 2020-01-31 NOTE — Progress Notes (Signed)
ANTICOAGULATION CONSULT NOTE - Follow Up Consult  Pharmacy Consult for Heparin Indication: atrial fibrillation  Allergies  Allergen Reactions  . Bactrim [Sulfamethoxazole-Trimethoprim] Anaphylaxis  . Amoxicillin Hives  . Diclofenac Tinitus    swelling in feet  . Morphine And Related Nausea And Vomiting and Other (See Comments)    headache  . Potassium-Containing Compounds Itching    Patient Measurements: Height: 5\' 5"  (165.1 cm) Weight: 100.2 kg (220 lb 14.4 oz) IBW/kg (Calculated) : 57 Heparin Dosing Weight:  80 kg  Vital Signs: Temp: 97.7 F (36.5 C) (09/09 1929) Temp Source: Rectal (09/09 1929) BP: 142/73 (09/10 0100) Pulse Rate: 88 (09/10 0100)  Labs: Recent Labs    01/28/20 0404 01/28/20 0404 01/29/20 1962 01/29/20 0908 01/29/20 2025 01/30/20 0505 01/30/20 1512 01/31/20 0009  HGB 14.3   < >  --  13.4  --  12.7  --   --   HCT 48.9*  --   --  44.6  --  42.3  --   --   PLT 103*  --   --  103*  --  113*  --   --   HEPARINUNFRC  --   --   --   --    < > <0.10* <0.10* 0.16*  CREATININE 0.61   < > 1.07* 1.03*  --  0.62  --   --    < > = values in this interval not displayed.    Estimated Creatinine Clearance: 84.4 mL/min (by C-G formula based on SCr of 0.62 mg/dL).  Assessment: Anticoag: Afib. Baseline CBC WNL. Started IV heparin.  -heparin level up to 0.16 after heparin bolus and increase   Goal of Therapy:  Heparin level 0.3-0.7 units/ml Monitor platelets by anticoagulation protocol: Yes   Plan:  Give 1500 units bolus x 1  Increase heparin infusion to 2100 units/hr HL in 6 hours and daily Plan DOAC at discharge.  Hildred Laser, PharmD Clinical Pharmacist **Pharmacist phone directory can now be found on [email or web address] ([email or web address]).  Listed under [place].

## 2020-01-31 NOTE — Progress Notes (Signed)
NAME:  [name] [name], MRN:  [id number], DOB:  1956/01/20, LOS: 5 ADMISSION DATE:  01/26/2020, CONSULTATION DATE:  01/28/20 REFERRING MD:  [facility]/ [facility], CHIEF COMPLAINT:  resp distress    Brief History   89 yowf smoker admit 9/5 with R toe pain/ leg swelling p had nails cut with assoc chills/aches/  presumed cellulitis/ sepsis/ RAF with worse wob/sats on 9/7 so intubated by anestesia and PCCM service consulted.   History of present illness   Per H/P as pt intubated prior to PCCM consult:  64 y.o. female with medical history significant for COPD on 2L [place] at bedtime, hypertension, type 2 diabetes, obesity, bipolar disorder/anxiety disorder, ongoing tobacco use, questionable ongoing alcohol abuse, and prior polysubstance abuse with methamphetamine and alcohol who presented to the ED with progressive worsening of pain, erythema, and serosanguineous drainage from her right second toe.  This began to gradually develop after her daughter trimmed her nail too close to her skin approximately 2 weeks ago.  She had noticed some fever, chills, body aches, and mild headache as well as general malaise at home.  She tried soaking her foot at home with no improvement in her symptoms.  She denied any chest pain, shortness of breath, abdominal pain, nausea, vomiting, or dysuria.  She denied any upper respiratory symptoms as well.        Past Medical History  COPD/ 02 dep/ still smoking PTA  HBP DM  Obesity Bipolar ? Etoh abuse    Significant Hospital Events   Afib 9/8 > back to SR 9/9 on cardizem  Consults:  PCCM pm 9/7  Procedures:  Oral ET 9/7 >>  Significant Diagnostic Tests:  Echo 9/8 mild LVH with Mild LAE   Micro Data:  SARS covid   01/26/20 BC x 2  9/5  Group A strep sensitive to cephalosporins BC x 2  9/7 >>> MRSA PCR  9/8 >> neg   Antimicrobials:  Vanc 9/5 only Rocephin 9/5 only  Ancef 9/6 >>>   Scheduled Meds: . arformoterol  15 mcg Nebulization BID  . benztropine  0.5 mg Per  Tube BID  . budesonide (PULMICORT) nebulizer solution  0.5 mg Nebulization BID  . chlorhexidine gluconate (MEDLINE KIT)  15 mL Mouth Rinse BID  . Chlorhexidine Gluconate Cloth  6 each Topical Daily  . cloNIDine  0.1 mg Per Tube TID  . diltiazem  60 mg Per Tube Q6H  . docusate  100 mg Per Tube BID  . feeding supplement (VITAL HIGH PROTEIN)  1,000 mL Per Tube Q24H  . folic acid  1 mg Per Tube Daily  . insulin aspart  0-9 Units Subcutaneous Q4H  . mouth rinse  15 mL Mouth Rinse 10 times per day  . nicotine  7 mg Transdermal Daily  . pantoprazole (PROTONIX) IV  40 mg Intravenous Q24H  . polyethylene glycol  17 g Oral Daily  . thiamine  100 mg Oral Daily   Or  . thiamine  100 mg Intravenous Daily  . valproic acid  500 mg Per Tube BID   Continuous Infusions: . sodium chloride 10 mL/hr at 01/29/20 1401  .  ceFAZolin (ANCEF) IV 2 g (01/31/20 1258)  . heparin 2,250 Units/hr (01/31/20 1304)  . lactated ringers 75 mL/hr at 01/31/20 1010  . midazolam 2 mg/hr (01/31/20 1103)   PRN Meds:.sodium chloride, acetaminophen **OR** acetaminophen, fentaNYL (SUBLIMAZE) injection, fentaNYL (SUBLIMAZE) injection, labetalol, levalbuterol, ondansetron **OR** ondansetron (ZOFRAN) IV  Interim history/subjective:   Increased wob/ severe desats on  wake up assessment / psv attempted this am   Objective   Blood pressure (!) 150/78, pulse 97, temperature 98.6 F (37 C), temperature source Rectal, resp. rate (!) 28, height _0  (1.651 m), weight 100.8 kg, SpO2 93 %.    Vent Mode: PRVC FiO2 (%):  [40 %] 40 % Set Rate:  [22 bmp] 22 bmp Vt Set:  [450 mL] 450 mL PEEP:  [5 cmH20] 5 cmH20 Plateau Pressure:  [21 cmH20-30 cmH20] 25 cmH20   Intake/Output Summary (Last 24 hours) at 01/31/2020 1538 Last data filed at 01/31/2020 0849 Gross per 24 hour  Intake 2873.03 ml  Output 1050 ml  Net 1823.03 ml   Filed Weights   01/29/20 0400 01/30/20 0500 01/31/20 0500  Weight: 98.8 kg 100.2 kg 100.8 kg     Examination: Tmax  99.8    downard trend continues  Pt  sedated No jvd Oropharynx et  Neck supple Lungs with a min  scattered exp > insp rhonchi bilaterally RRR no s3 or or sign murmur Abd obese with limited  excursion  Extr still warm swollen R leg s change cellulitis margins    I personally reviewed images and agree with radiology impression as follows:  CXR:   9/10  Decreased aeration L base   Resolved Hospital Problem list      Assessment & Plan:   1)  Acute respiratory failure/ hypoxemia and hypercabia  in setting of sepsis/ gm pos bacteremia from apparent cellulitis RLE  - ARDS protocol ok to use here  - adequate sedation at present - increased clonidine to 0.2 tid pm 9/10  -  changed to hydrocortisone 9/7   2)  AFib with RAF > SR rx per Cards/ [facility]   3)  Severe cellulitis RLE p cutting toes/ warm foot and good pulse so no compartment issues yet    4)  HBP  Increased  Clonidine to  0.2 mg tid 9/8   5) Atx vs as dz LLL(? HCAP)  post basal segment > add mucomyst/ check sputum   Best practice:  Diet: tf per nutrition  Pain/Anxiety/Delirium protocol (if indicated): sedated on vent VAP protocol (if indicated):  DVT prophylaxis: full IV hep  GI prophylaxis: protonix Glucose control: per [facility]ad  Mobility: bed rest Code Status: full  Family Communication: per [facility]ad  Disposition: ICU  Labs   CBC: Recent Labs  Lab 01/26/20 1350 01/26/20 1350 01/27/20 0829 01/28/20 0404 01/29/20 0908 01/30/20 0505 01/31/20 0607  WBC 12.9*   < > 10.2 12.3* 12.6* 14.2* 11.0*  NEUTROABS 11.3*  --   --   --   --   --   --   HGB 15.3*   < > 13.7 14.3 13.4 12.7 11.8*  HCT 48.8*   < > 46.5* 48.9* 44.6 42.3 40.0  MCV 92.4   < > 96.3 96.6 94.1 94.4 95.5  PLT 129*   < > 97* 103* 103* 113* 136*   < > = values in this interval not displayed.    Basic Metabolic Panel: Recent Labs  Lab 01/27/20 0829 01/27/20 0829 01/28/20 0404 01/29/20 0973 01/29/20 0908 01/29/20 1632  01/30/20 0505 01/30/20 1512 01/31/20 0607  NA 140  --  142  --  143  --  140  --  142  K 4.3  --  3.7  --  3.0*  --  3.5  --  3.4*  CL 103  --  99  --  96*  --  97*  --  98  CO2 27  --  30  --  34*  --  34*  --  35*  GLUCOSE 132*  --  171*  --  152*  --  204*  --  192*  BUN 40*  --  36*  --  63*  --  72*  --  51*  CREATININE 0.75   < > 0.61 1.07* 1.03*  --  0.62  --  0.51  CALCIUM 8.2*  --  9.0  --  8.8*  --  8.9  --  8.9  MG 1.9   < > 2.2  --  2.3 2.4 2.2 2.1  --   PHOS  --   --   --   --  2.3* 1.9* 1.9* 2.3*  --    < > = values in this interval not displayed.   GFR: Estimated Creatinine Clearance: 84.7 mL/min (by C-G formula based on SCr of 0.51 mg/dL). Recent Labs  Lab 01/26/20 1350 01/26/20 1350 01/26/20 1611 01/27/20 0829 01/27/20 0829 01/28/20 0404 01/29/20 0908 01/30/20 0505 01/31/20 0607  PROCALCITON  --   --   --  3.34  --   --   --   --   --   WBC 12.9*   < >  --  10.2   < > 12.3* 12.6* 14.2* 11.0*  LATICACIDVEN 1.5  --  0.8 0.8  --   --   --   --   --    < > = values in this interval not displayed.    Liver Function Tests: Recent Labs  Lab 01/26/20 1350 01/27/20 0829  AST 14* 12*  ALT 17 14  ALKPHOS 96 86  BILITOT 0.5 0.2*  PROT 7.8 6.7  ALBUMIN 3.7 2.9*   No results for input(s): LIPASE, AMYLASE in the last 168 hours. No results for input(s): AMMONIA in the last 168 hours.  ABG    Component Value Date/Time   PHART 7.300 (L) 01/28/2020 1358   PCO2ART 78.6 (HH) 01/28/2020 1358   PO2ART 170 (H) 01/28/2020 1358   HCO3 31.8 (H) 01/28/2020 1358   O2SAT 99.1 01/28/2020 1358     Coagulation Profile: Recent Labs  Lab 01/26/20 1350 01/27/20 0829  INR 1.1 1.2    Cardiac Enzymes: No results for input(s): CKTOTAL, CKMB, CKMBINDEX, TROPONINI in the last 168 hours.  HbA1C: Hgb A1c MFr Bld  Date/Time Value Ref Range Status  01/26/2020 01:50 PM 5.8 (H) 4.8 - 5.6 % Final    Comment:    (NOTE) Pre diabetes:          5.7%-6.4%  Diabetes:               >6.4%  Glycemic control for   <7.0% adults with diabetes   04/12/2016 03:49 PM 5.6 4.8 - 5.6 % Final    Comment:    (NOTE)         Pre-diabetes: 5.7 - 6.4         Diabetes: >6.4         Glycemic control for adults with diabetes: <7.0     CBG: Recent Labs  Lab 01/30/20 2010 01/31/20 0122 01/31/20 0448 01/31/20 0843 01/31/20 1129  GLUCAP 146* 166* 166* 180* 139*       The patient is critically ill with multiple organ systems failure and requires high complexity decision making for assessment and support, frequent evaluation and titration of therapies, application of advanced monitoring technologies and extensive interpretation of multiple databases. Critical Care  Time devoted to patient care services described in this note is 35 minutes.   Christinia Gully, MD Pulmonary and [facility] [phone]   After 7:00 pm call Elink  [phone]

## 2020-01-31 NOTE — Progress Notes (Signed)
ANTICOAGULATION CONSULT NOTE - [place] for Heparin Indication: atrial fibrillation  Allergies  Allergen Reactions  . Bactrim [Sulfamethoxazole-Trimethoprim] Anaphylaxis  . Amoxicillin Hives  . Diclofenac Tinitus    swelling in feet  . Morphine And Related Nausea And Vomiting and Other (See Comments)    headache  . Potassium-Containing Compounds Itching    Patient Measurements: Height: 5\' 5"  (165.1 cm) Weight: 100.8 kg (222 lb 3.6 oz) IBW/kg (Calculated) : 57 Heparin Dosing Weight:  80 kg  Vital Signs: Temp: 98 F (36.7 C) (09/10 1600) Temp Source: Rectal (09/10 1600) BP: 159/69 (09/10 1900) Pulse Rate: 92 (09/10 1900)  Labs: Recent Labs    01/29/20 0908 01/29/20 2025 01/30/20 0505 01/30/20 1512 01/31/20 0009 01/31/20 0607 01/31/20 1123 01/31/20 1918  HGB 13.4  --  12.7  --   --  11.8*  --   --   HCT 44.6  --  42.3  --   --  40.0  --   --   PLT 103*  --  113*  --   --  136*  --   --   HEPARINUNFRC  --    < > <0.10*   < > 0.16*  --  0.26* 0.36  CREATININE 1.03*  --  0.62  --   --  0.51  --   --    < > = values in this interval not displayed.    Estimated Creatinine Clearance: 84.7 mL/min (by C-G formula based on SCr of 0.51 mg/dL).  Assessment: Pharmacy was consulted to dose heparin in 64 yr old female with atrial fibrillation.  Patient was not on anticoagulation prior to admission.  Heparin level ~6 hrs after heparin 1000 units IV bolus X 1, followed by increasing heparin infusion to 2250 units/hr, was 0.36 units/ml, which is within the goal range for this pt. H/H 11.8/40.0, platelets 136. Per RN, no issues with IV or bleeding observed.  Goal of Therapy:  Heparin level 0.3-0.7 units/ml Monitor platelets by anticoagulation protocol: Yes   Plan:  Continue heparin infusion at 2250 units/hr Check confirmatory heparin level in 6 hrs Monitor daily heparin level, CBC Monitor for signs/symptoms of bleeding  Gillermina Hu, PharmD,  BCPS, [facility] Clinical Pharmacist 01/31/20, 20:33 PM

## 2020-02-01 ENCOUNTER — Encounter (HOSPITAL_COMMUNITY): Payer: Self-pay | Admitting: Radiology

## 2020-02-01 ENCOUNTER — Inpatient Hospital Stay (HOSPITAL_COMMUNITY): Payer: Medicaid Other

## 2020-02-01 LAB — URINALYSIS, ROUTINE W REFLEX MICROSCOPIC
Bilirubin Urine: NEGATIVE
Glucose, UA: NEGATIVE mg/dL
Hgb urine dipstick: NEGATIVE
Ketones, ur: NEGATIVE mg/dL
Leukocytes,Ua: NEGATIVE
Nitrite: NEGATIVE
Protein, ur: NEGATIVE mg/dL
Specific Gravity, Urine: 1.031 — ABNORMAL HIGH (ref 1.005–1.030)
pH: 5 (ref 5.0–8.0)

## 2020-02-01 LAB — BASIC METABOLIC PANEL
Anion gap: 10 (ref 5–15)
BUN: 32 mg/dL — ABNORMAL HIGH (ref 8–23)
CO2: 35 mmol/L — ABNORMAL HIGH (ref 22–32)
Calcium: 8.6 mg/dL — ABNORMAL LOW (ref 8.9–10.3)
Chloride: 97 mmol/L — ABNORMAL LOW (ref 98–111)
Creatinine, Ser: 0.38 mg/dL — ABNORMAL LOW (ref 0.44–1.00)
GFR calc Af Amer: >60 mL/min (ref >60)
GFR calc non Af Amer: >60 mL/min (ref >60)
Glucose, Bld: 156 mg/dL — ABNORMAL HIGH (ref 70–99)
Potassium: 4.2 mmol/L (ref 3.5–5.1)
Sodium: 142 mmol/L (ref 135–145)

## 2020-02-01 LAB — CBC
HCT: 40 % (ref 36.0–46.0)
Hemoglobin: 11.7 g/dL — ABNORMAL LOW (ref 12.0–15.0)
MCH: 28.1 pg (ref 26.0–34.0)
MCHC: 29.3 g/dL — ABNORMAL LOW (ref 30.0–36.0)
MCV: 95.9 fL (ref 80.0–100.0)
Platelets: 153 10*3/uL (ref 150–400)
RBC: 4.17 MIL/uL (ref 3.87–5.11)
RDW: 16.2 % — ABNORMAL HIGH (ref 11.5–15.5)
WBC: 10 10*3/uL (ref 4.0–10.5)
nRBC: 0 % (ref 0.0–0.2)

## 2020-02-01 LAB — GLUCOSE, CAPILLARY
Glucose-Capillary: 123 mg/dL — ABNORMAL HIGH (ref 70–99)
Glucose-Capillary: 152 mg/dL — ABNORMAL HIGH (ref 70–99)
Glucose-Capillary: 185 mg/dL — ABNORMAL HIGH (ref 70–99)
Glucose-Capillary: 157 mg/dL — ABNORMAL HIGH (ref 70–99)
Glucose-Capillary: 142 mg/dL — ABNORMAL HIGH (ref 70–99)
Glucose-Capillary: 161 mg/dL — ABNORMAL HIGH (ref 70–99)

## 2020-02-01 LAB — TRIGLYCERIDES: Triglycerides: 124 mg/dL (ref <150)

## 2020-02-01 LAB — HEPARIN LEVEL (UNFRACTIONATED): Heparin Unfractionated: 0.39 IU/mL (ref 0.30–0.70)

## 2020-02-01 MED ORDER — VANCOMYCIN HCL 1500 MG/300ML IV SOLN
1500.0000 mg | INTRAVENOUS | Status: DC
  Administered 2020-02-02 – 2020-02-05 (×4): 1500 mg via INTRAVENOUS
  Filled 2020-02-01 (×4): qty 300

## 2020-02-01 MED ORDER — LEVALBUTEROL HCL 0.63 MG/3ML IN NEBU
0.6300 mg | INHALATION_SOLUTION | Freq: Four times a day (QID) | RESPIRATORY_TRACT | Status: DC
  Administered 2020-02-01 – 2020-02-03 (×7): 0.63 mg via RESPIRATORY_TRACT
  Filled 2020-02-01 (×7): qty 3

## 2020-02-01 MED ORDER — ACETYLCYSTEINE 20 % IN SOLN
2.0000 mL | Freq: Four times a day (QID) | RESPIRATORY_TRACT | Status: DC
  Administered 2020-02-01 – 2020-02-03 (×7): 2 mL via RESPIRATORY_TRACT
  Filled 2020-02-01 (×7): qty 4

## 2020-02-01 MED ORDER — VANCOMYCIN HCL 2000 MG/400ML IV SOLN
2000.0000 mg | Freq: Once | INTRAVENOUS | Status: AC
  Administered 2020-02-01: 2000 mg via INTRAVENOUS
  Filled 2020-02-01: qty 400

## 2020-02-01 MED ORDER — IOHEXOL 300 MG/ML  SOLN
75.0000 mL | Freq: Once | INTRAMUSCULAR | Status: AC | PRN
  Administered 2020-02-01: 75 mL via INTRAVENOUS

## 2020-02-01 NOTE — Progress Notes (Signed)
ANTICOAGULATION CONSULT NOTE - [place] for Heparin Indication: atrial fibrillation  Allergies  Allergen Reactions  . Bactrim [Sulfamethoxazole-Trimethoprim] Anaphylaxis  . Amoxicillin Hives  . Diclofenac Tinitus    swelling in feet  . Morphine And Related Nausea And Vomiting and Other (See Comments)    headache  . Potassium-Containing Compounds Itching    Patient Measurements: Height: 5\' 5"  (165.1 cm) Weight: 105.1 kg (231 lb 11.3 oz) IBW/kg (Calculated) : 57 Heparin Dosing Weight:  80 kg  Vital Signs: BP: 166/80 (09/11 0800) Pulse Rate: 96 (09/11 0800)  Labs: Recent Labs    01/29/20 0908 01/29/20 2025 01/30/20 0505 01/30/20 1512 01/31/20 0009 01/31/20 0607 01/31/20 1123 01/31/20 1918 02/01/20 0613  HGB 13.4  --  12.7  --   --  11.8*  --   --  11.7*  HCT 44.6  --  42.3  --   --  40.0  --   --  40.0  PLT 103*  --  113*  --   --  136*  --   --  153  HEPARINUNFRC  --    < > <0.10*   < >   < >  --  0.26* 0.36 0.39  CREATININE 1.03*  --  0.62  --   --  0.51  --   --   --    < > = values in this interval not displayed.    Estimated Creatinine Clearance: 86.6 mL/min (by C-G formula based on SCr of 0.51 mg/dL).  Assessment: Pharmacy was consulted to dose heparin in 64 yr old female with atrial fibrillation.  Patient was not on anticoagulation prior to admission.  Heparin level 0.39, which is within the goal range for this pt. CBC WNL,  Per RN, no issues with IV or bleeding observed.  Goal of Therapy:  Heparin level 0.3-0.7 units/ml Monitor platelets by anticoagulation protocol: Yes   Plan:  Continue heparin infusion at 2250 units/hr Monitor daily heparin level, CBC Monitor for signs/symptoms of bleeding  Margot Ables, PharmD Clinical Pharmacist 02/01/2020 8:26 AM

## 2020-02-01 NOTE — Progress Notes (Signed)
Pharmacy Antibiotic Note  [name] [name] is a 64 y.o. female admitted on 01/26/2020 with worsening cellulitis while on cefazolin. Pharmacy has been consulted for Vancomycin dosing.  Plan: Vancomycin 2000 mg IV x 1 dose. Vancomycin 1500 mg IV every 24 hours. Expected AUC 421. Monitor labs, c/s, and vanco level as indicated.  Height: 5\' 5"  (165.1 cm) Weight: 105.1 kg (231 lb 11.3 oz) IBW/kg (Calculated) : 57  Temp (24hrs), Avg:98.5 F (36.9 C), Min:98 F (36.7 C), Max:99.4 F (37.4 C)  Recent Labs  Lab 01/26/20 1350 01/26/20 1350 01/26/20 1611 01/27/20 0829 01/27/20 0829 01/28/20 0404 01/28/20 0404 01/29/20 0350 01/29/20 0908 01/30/20 0505 01/31/20 0607 02/01/20 0613  WBC 12.9*   < >  --  10.2   < > 12.3*  --   --  12.6* 14.2* 11.0* 10.0  CREATININE 1.10*   < >  --  0.75   < > 0.61   < > 1.07* 1.03* 0.62 0.51 0.38*  LATICACIDVEN 1.5  --  0.8 0.8  --   --   --   --   --   --   --   --    < > = values in this interval not displayed.    Estimated Creatinine Clearance: 86.6 mL/min (A) (by C-G formula based on SCr of 0.38 mg/dL (L)).    Allergies  Allergen Reactions  . Bactrim [Sulfamethoxazole-Trimethoprim] Anaphylaxis  . Amoxicillin Hives  . Diclofenac Tinitus    swelling in feet  . Morphine And Related Nausea And Vomiting and Other (See Comments)    headache  . Potassium-Containing Compounds Itching    Antimicrobials this admission: Vanco 9/5 >> 9/6 CTX 9/5 x 1 9/6 ancef >>9/11 Vanco 9/11 >>   Microbiology results: 9/5 BCx: BCID Group A strep. Strep pyogenes 9/5: COVID negative  Thank you for allowing pharmacy to be a part of this patient's care.  Ramond Craver 02/01/2020 12:26 PM

## 2020-02-01 NOTE — Progress Notes (Signed)
A small blister on patient's RLE popped and leaked serous fluid. I covered the site with a small piece of vaseline guaze and wrapped it lightly. I also noticed two small blisters on the patients inner thighs and placed an ABD pad between her legs to protect her skin. I also placed heel protectors on patient and have been repositioning with pillows throughout the shift.

## 2020-02-01 NOTE — Progress Notes (Signed)
PROGRESS NOTE    [name] [name]  DXA:[id number] DOB: 1955-07-24 DOA: 01/26/2020 PCP: Leonard Downing, MD   Brief Narrative:  Per HPI: [name] [name] a 64 y.o.femalewith medical history significant forCOPD on 2L [place] at bedtime, hypertension, type 2 diabetes, obesity, bipolar disorder/anxiety disorder, ongoing tobacco use,questionable ongoing alcohol abuse,and prior polysubstance abuse with methamphetamine and alcohol who presented to the ED with progressive worsening of pain, erythema, and serosanguineous drainage from her right second toe. This began to gradually develop after her daughter trimmed her nail too close to her skin approximately 2 weeks ago. She has noticed some fever, chills, body aches, and mild headache as well as general malaise at home. She tried soaking her foot at home with no improvement in her symptoms. She denies any chest pain, shortness of breath, abdominal pain, nausea, vomiting, or dysuria. She denies any upper respiratory symptoms as well. She has not been vaccinated for COVID-19.  ED Course:Vital signs demonstrate ongoing sinus tachycardia and this is confirmed on EKG with heart rate approximately 110 bpm. Temperature 1-1.5 Fahrenheit. She is noted to have a leukocytosis of 12,900 as well as a thrombocytopenia of 129,000. BUN is 38 and creatinine is elevated at 1.1 with baseline 0.3-0.5. Chest x-ray with mild cardiomegaly and no other acute findings. Covid testing negative. Right foot x-ray with no soft tissue swelling or signs of osteomyelitis noted. Lactic acid is 1.5. Patient has been ordered and started 3 L fluid bolus as well as vancomycin and Rocephin for suspected sepsis secondary to cellulitis.  9/8:Patient remains comfortable on ventilator and is noted to be in sinus rhythm this morning. Plan to discontinue IV Cardizem drip and transition to oral as ordered. 2D echocardiogram with LVEF 55-60% with no vegetations noted. She is  being treated with Ancef for group A strep bacteremia related to cellulitis. Plan to consult cardiology today for planning of TEE. Start tube feedings as well. Appreciate further PCCM recommendations.  9/9:Patient remains comfortable on the ventilator and at 40% FiO2. Patient remains in sinus rhythm with Cardizem as ordered by cardiology. Plans for TEE once patient stable and off ventilator.  9/10: Patient is responsive this morning on the ventilator at FiO2 40%. She continues to remain on sinus rhythm with heart rate control that appears adequate. Trial of extubation today per PCCM. Continue current antibiotics.  9/11:Patient remains on ventilator this morning and could not extubate yesterday.  Right lower extremity appears more swollen with worsening cellulitis.  Discussed case with general surgery who has evaluated patient with recommendation for CT of the lower extremity which is still pending.  Plan to add vancomycin on board as well.  Continue monitoring.  Assessment & Plan:   Active Problems:   Acute respiratory failure with hypoxia and hypercapnia (HCC)   Sepsis (HCC)   Cellulitis of right lower leg   1-sepsis: In the setting of Streptococcus cellulitis/bacteremia. Present on admission -Patient met sepsis criteria on admission with sinus tachycardia, elevated white blood cells, febrile and with identified source of infection of cellulitic process. She had acute kidney injury as organ dysfunction. -No signs of osteomyelitis onfootchest x-ray -Blood culture positive for Streptococcus -Continue to follow ESR and CRP -Continue on Ancefbased on sensitivities and add vancomycin 9/11 for additional coverage -CT R LE on 9/11 to assess for abscess -Transthoracic echocardiogram with LVEF 55 to 60% and no vegetations noted, appreciate cardiology consultation for TEEonce extubated -Continue as needed antipyretics and supportive care.  2-acute on chronic respiratory failure with  hypoxia and hypercapnias/p  intubation -Appears to be a combination of COPD exacerbation, Underlying obesity hypoventilation syndrome and component of interstitial pulmonary edema/vascular congestion after fluid resuscitation as per sepsis protocol. -Hold further lasix -PCCM following with likely trial of extubation today.  3-acute kidney injury-resolved -Baseline creatinine 0.6-0.7, currently around0.38 -Continue tube feeds and fluid resuscitation  4-mild thrombocytopenia-resolved -Likely related to acute sepsis and underlying history of alcohol abuse -No signs of overt bleeding -Monitor carefully while on heparin drip -Repeat CBC in a.m.  5-alcohol abuse -Continue CIWA monitoring -Continue thiamine and folic acid.  6-class II obesity  -Body mass index is 36.58 kg/m. -Low calorie diet, portion control and increase physical activity discussed with patient.  7-history of hypertension -Holding home amlodipine for now -Hold further Lasix -Hydrocortisone discontinued. -Continue oral Cardizem -Labetalol as needed  8-history of tobacco abuse -Cessation counseling has been provided. -Continue nicotine patch.  9-depression/anxiety/history of seizures -Plan is to resume psychotropic home medications once able to take by mouth. -Continue Depakote  10-type 2 diabetes mellitus -A1c 5.8 -While inpatient continue sliding scale insulin -At discharge will resume the use of Metformin in order to continue assisting with management of insulin resistant and obesity. -Modified carbohydrate diet and lifestylerecommended after discharge.  11-new onset atrial fibrillation with RVR-currently in sinus rhythm -Continue oral Cardizem as ordered -TSH 0.294, check free T3 0.7, T4 0.75 -2D echocardiogram with LVEF 55-60% -CHA2DS2-VASc score of 3 for which I will start heparin drip -Appreciatecardiology recommendations for eventual TEE once  extubated  12-Hypokalemia-resolved -Monitor  DVT prophylaxis:Heparin drip Code Status:Full Family Communication:Discussed with daughter at bedside9/10 Disposition Plan: Status is: Inpatient  Remains inpatient appropriate because:Ongoing diagnostic testing needed not appropriate for outpatient work up, IV treatments appropriate due to intensity of illness or inability to take PO and Inpatient level of care appropriate due to severity of illness   Dispo: The patient is from:Home Anticipated d/c is to:SNF Anticipated d/c date is: > 3 days Patient currently is not medically stable to d/c.Patient currently remains intubated on the ventilator and will need further evaluation per cardiology along with ongoing evaluation per PCCM.  Consultants:  PCCM  Cardiology  Procedures:  2D echocardiogram 9/8 with LVEF 55-60%. No vegetations.  Antimicrobials:  Anti-infectives (From admission, onward)   Start     Dose/Rate Route Frequency Ordered Stop   02/01/20 1130  vancomycin (VANCOREADY) IVPB 2000 mg/400 mL        2,000 mg 200 mL/hr over 120 Minutes Intravenous  Once 02/01/20 1108     01/27/20 1500  vancomycin (VANCOREADY) IVPB 1500 mg/300 mL  Status:  Discontinued        1,500 mg 150 mL/hr over 120 Minutes Intravenous Every 24 hours 01/26/20 1428 01/27/20 0827   01/27/20 0830  ceFAZolin (ANCEF) IVPB 2g/100 mL premix        2 g 200 mL/hr over 30 Minutes Intravenous Every 8 hours 01/27/20 0827     01/26/20 1545  vancomycin (VANCOCIN) IVPB 1000 mg/200 mL premix  Status:  Discontinued        1,000 mg 200 mL/hr over 60 Minutes Intravenous  Once 01/26/20 1541 01/26/20 1553   01/26/20 1415  vancomycin (VANCOCIN) IVPB 1000 mg/200 mL premix  Status:  Discontinued        1,000 mg 200 mL/hr over 60 Minutes Intravenous  Once 01/26/20 1408 01/26/20 1413   01/26/20 1415  cefTRIAXone (ROCEPHIN) 2 g in sodium chloride 0.9 % 100 mL IVPB        2  g 200 mL/hr over  30 Minutes Intravenous  Once 01/26/20 1408 01/26/20 2011   01/26/20 1415  vancomycin (VANCOREADY) IVPB 2000 mg/400 mL        2,000 mg 200 mL/hr over 120 Minutes Intravenous  Once 01/26/20 1413 01/26/20 2011      Subjective: Patient seen and evaluated today with no new acute complaints or concerns noted overnight.  She remains on the ventilator and sedated.  Right lower extremity swelling appears worsened.  Objective: Vitals:   02/01/20 0900 02/01/20 0923 02/01/20 1000 02/01/20 1151  BP: (!) 141/60  (!) 145/62   Pulse: 87  89 81  Resp: (!) 22  (!) 30 (!) 29  Temp:    99.4 F (37.4 C)  TempSrc:    Axillary  SpO2: 93% 93% 91% 95%  Weight:      Height:        Intake/Output Summary (Last 24 hours) at 02/01/2020 1200 Last data filed at 02/01/2020 1000 Gross per 24 hour  Intake 4313.64 ml  Output 2175 ml  Net 2138.64 ml   Filed Weights   01/30/20 0500 01/31/20 0500 02/01/20 0200  Weight: 100.2 kg 100.8 kg 105.1 kg    Examination:  General exam: Appears calm and comfortable, currently sedated on the ventilator Respiratory system: Clear to auscultation. Respiratory effort normal.  Currently on ventilator FiO2 40% Cardiovascular system: S1 & S2 heard, RRR.  Gastrointestinal system: Abdomen is nondistended, soft and nontender.  Central nervous system: Alert and oriented. No focal neurological deficits. Extremities: Right lower extremity with increased edema and erythema. Skin: No rashes, lesions or ulcers Psychiatry: Cannot be assessed.    Data Reviewed: I have personally reviewed following labs and imaging studies  CBC: Recent Labs  Lab 01/26/20 1350 01/27/20 0829 01/28/20 0404 01/29/20 0908 01/30/20 0505 01/31/20 0607 02/01/20 0613  WBC 12.9*   < > 12.3* 12.6* 14.2* 11.0* 10.0  NEUTROABS 11.3*  --   --   --   --   --   --   HGB 15.3*   < > 14.3 13.4 12.7 11.8* 11.7*  HCT 48.8*   < > 48.9* 44.6 42.3 40.0 40.0  MCV 92.4   < > 96.6 94.1 94.4 95.5  95.9  PLT 129*   < > 103* 103* 113* 136* 153   < > = values in this interval not displayed.   Basic Metabolic Panel: Recent Labs  Lab 01/28/20 0404 01/28/20 0404 01/29/20 1700 01/29/20 0908 01/29/20 1632 01/30/20 0505 01/30/20 1512 01/31/20 0607 02/01/20 0613  NA 142  --   --  143  --  140  --  142 142  K 3.7  --   --  3.0*  --  3.5  --  3.4* 4.2  CL 99  --   --  96*  --  97*  --  98 97*  CO2 30  --   --  34*  --  34*  --  35* 35*  GLUCOSE 171*  --   --  152*  --  204*  --  192* 156*  BUN 36*  --   --  63*  --  72*  --  51* 32*  CREATININE 0.61   < > 1.07* 1.03*  --  0.62  --  0.51 0.38*  CALCIUM 9.0  --   --  8.8*  --  8.9  --  8.9 8.6*  MG 2.2  --   --  2.3 2.4 2.2 2.1  --   --   PHOS  --   --   --  2.3* 1.9* 1.9* 2.3*  --   --    < > = values in this interval not displayed.   GFR: Estimated Creatinine Clearance: 86.6 mL/min (A) (by C-G formula based on SCr of 0.38 mg/dL (L)). Liver Function Tests: Recent Labs  Lab 01/26/20 1350 01/27/20 0829  AST 14* 12*  ALT 17 14  ALKPHOS 96 86  BILITOT 0.5 0.2*  PROT 7.8 6.7  ALBUMIN 3.7 2.9*   No results for input(s): LIPASE, AMYLASE in the last 168 hours. No results for input(s): AMMONIA in the last 168 hours. Coagulation Profile: Recent Labs  Lab 01/26/20 1350 01/27/20 0829  INR 1.1 1.2   Cardiac Enzymes: No results for input(s): CKTOTAL, CKMB, CKMBINDEX, TROPONINI in the last 168 hours. BNP (last 3 results) No results for input(s): PROBNP in the last 8760 hours. HbA1C: No results for input(s): HGBA1C in the last 72 hours. CBG: Recent Labs  Lab 01/31/20 2012 01/31/20 2317 02/01/20 0257 02/01/20 0833 02/01/20 1149  GLUCAP 170* 185* 123* 152* 185*   Lipid Profile: Recent Labs    01/31/20 0607 02/01/20 0613  TRIG 230* 124   Thyroid Function Tests: Recent Labs    01/29/20 1632  FREET4 0.75  T3FREE 0.7*   Anemia Panel: No results for input(s): VITAMINB12, FOLATE, FERRITIN, TIBC, IRON, RETICCTPCT in  the last 72 hours. Sepsis Labs: Recent Labs  Lab 01/26/20 1350 01/26/20 1611 01/27/20 0829  PROCALCITON  --   --  3.34  LATICACIDVEN 1.5 0.8 0.8    Recent Results (from the past 240 hour(s))  SARS Coronavirus 2 by RT PCR (hospital order, performed in [facility] hospital lab) Nasopharyngeal Nasopharyngeal Swab     Status: None   Collection Time: 01/26/20  1:17 PM   Specimen: Nasopharyngeal Swab  Result Value Ref Range Status   SARS Coronavirus 2 NEGATIVE NEGATIVE Final    Comment: (NOTE) SARS-CoV-2 target nucleic acids are NOT DETECTED.  The SARS-CoV-2 RNA is generally detectable in upper and lower respiratory specimens during the acute phase of infection. The lowest concentration of SARS-CoV-2 viral copies this assay can detect is 250 copies / mL. A negative result does not preclude SARS-CoV-2 infection and should not be used as the sole basis for treatment or other patient management decisions.  A negative result may occur with improper specimen collection / handling, submission of specimen other than nasopharyngeal swab, presence of viral mutation(s) within the areas targeted by this assay, and inadequate number of viral copies (<250 copies / mL). A negative result must be combined with clinical observations, patient history, and epidemiological information.  Fact Sheet for Patients:   StrictlyIdeas.no  Fact Sheet for Healthcare Providers: BankingDealers.co.za  This test is not yet approved or  cleared by the [place] FDA and has been authorized for detection and/or diagnosis of SARS-CoV-2 by FDA under an Emergency Use Authorization (EUA).  This EUA will remain in effect (meaning this test can be used) for the duration of the COVID-19 declaration under Section 564(b)(1) of the Act, 21 U.S.C. section 360bbb-3(b)(1), unless the authorization is terminated or revoked sooner.  Performed at [facility], [address], [place], [place] [address]   Blood Culture (routine x 2)     Status: Abnormal   Collection Time: 01/26/20  1:50 PM   Specimen: Right Antecubital; Blood  Result Value Ref Range Status   Specimen Description   Final    RIGHT ANTECUBITAL BOTTLES DRAWN AEROBIC AND ANAEROBIC Performed at [facility], [address]  [place], [place] [address]    Special Requests   Final    Blood Culture adequate volume Performed at [facility], [address] [place], [place] [address]    Culture  Setup Time   Final    GRAM POSITIVE COCCI Gram Stain Report Called to,Read Back By and Verified With: KILMER,T @ 9518 ON 01/27/20 BY JUW IN BOTH AEROBIC AND ANAEROBIC BOTTLES GS DONE @ [facility] Organism ID to follow Performed at [facility], [place] [address], [place], [place] [address]    Culture GROUP A STREP (S.PYOGENES) ISOLATED (A)  Final   Report Status 01/29/2020 FINAL  Final   Organism ID, Bacteria GROUP A STREP (S.PYOGENES) ISOLATED  Final      Susceptibility   Group a strep (s.pyogenes) isolated - MIC*    PENICILLIN <=0.06 SENSITIVE Sensitive     CEFTRIAXONE <=0.12 SENSITIVE Sensitive     ERYTHROMYCIN >=8 RESISTANT Resistant     LEVOFLOXACIN 0.5 SENSITIVE Sensitive     VANCOMYCIN <=0.12 SENSITIVE Sensitive     * GROUP A STREP (S.PYOGENES) ISOLATED  Blood Culture ID Panel (Reflexed)     Status: Abnormal   Collection Time: 01/26/20  1:50 PM  Result Value Ref Range Status   Enterococcus faecalis NOT DETECTED NOT DETECTED Final   Enterococcus Faecium NOT DETECTED NOT DETECTED Final   Listeria monocytogenes NOT DETECTED NOT DETECTED Final   Staphylococcus species NOT DETECTED NOT DETECTED Final   Staphylococcus aureus (BCID) NOT DETECTED NOT DETECTED Final   Staphylococcus epidermidis NOT DETECTED NOT DETECTED Final   Staphylococcus lugdunensis NOT DETECTED NOT DETECTED Final   Streptococcus species DETECTED (A) NOT DETECTED Final    Comment: CRITICAL RESULT CALLED TO, READ BACK BY AND VERIFIED  WITH: G. Coffee PharmD 8:25 01/27/20 ([email or web address])    Streptococcus agalactiae NOT DETECTED NOT DETECTED Final   Streptococcus pneumoniae NOT DETECTED NOT DETECTED Final   Streptococcus pyogenes DETECTED (A) NOT DETECTED Final    Comment: CRITICAL RESULT CALLED TO, READ BACK BY AND VERIFIED WITH: G. Coffee PharmD 8:25 01/27/20 ([email or web address])    A.calcoaceticus-baumannii NOT DETECTED NOT DETECTED Final   Bacteroides fragilis NOT DETECTED NOT DETECTED Final   Enterobacterales NOT DETECTED NOT DETECTED Final   Enterobacter cloacae complex NOT DETECTED NOT DETECTED Final   Escherichia coli NOT DETECTED NOT DETECTED Final   Klebsiella aerogenes NOT DETECTED NOT DETECTED Final   Klebsiella oxytoca NOT DETECTED NOT DETECTED Final   Klebsiella pneumoniae NOT DETECTED NOT DETECTED Final   Proteus species NOT DETECTED NOT DETECTED Final   Salmonella species NOT DETECTED NOT DETECTED Final   Serratia marcescens NOT DETECTED NOT DETECTED Final   Haemophilus influenzae NOT DETECTED NOT DETECTED Final   Neisseria meningitidis NOT DETECTED NOT DETECTED Final   Pseudomonas aeruginosa NOT DETECTED NOT DETECTED Final   Stenotrophomonas maltophilia NOT DETECTED NOT DETECTED Final   Candida albicans NOT DETECTED NOT DETECTED Final   Candida auris NOT DETECTED NOT DETECTED Final   Candida glabrata NOT DETECTED NOT DETECTED Final   Candida krusei NOT DETECTED NOT DETECTED Final   Candida parapsilosis NOT DETECTED NOT DETECTED Final   Candida tropicalis NOT DETECTED NOT DETECTED Final   Cryptococcus neoformans/gattii NOT DETECTED NOT DETECTED Final    Comment: Performed at [facility], [place] [address], [place], [place] [address]  Blood Culture (routine x 2)     Status: Abnormal   Collection Time: 01/26/20  2:00 PM   Specimen: Left Antecubital; Blood  Result Value Ref Range Status  Specimen Description   Final    LEFT ANTECUBITAL BOTTLES DRAWN AEROBIC AND ANAEROBIC Performed at [facility], [address], [place], [place] [address]    Special Requests   Final    Blood Culture adequate volume Performed at [facility], [address], [place], [place] [address]    Culture  Setup Time   Final    GRAM POSITIVE COCCI Gram Stain Report Called to,Read Back By and Verified With: KILMER,T @ 9924 ON 01/27/20 BY JUW AEROBIC BOTTLE ONLY GS DONE @ [facility] Performed at [facility], [address], [place], [place] [address]    Culture (A)  Final    GROUP A STREP (S.PYOGENES) ISOLATED SUSCEPTIBILITIES PERFORMED ON PREVIOUS CULTURE WITHIN THE LAST 5 DAYS. Performed at [facility], [place] [address], [place], [place] [address]    Report Status 01/29/2020 FINAL  Final  Culture, blood (Routine X 2) w Reflex to ID Panel     Status: None (Preliminary result)   Collection Time: 01/28/20  1:01 PM   Specimen: BLOOD RIGHT HAND  Result Value Ref Range Status   Specimen Description BLOOD RIGHT HAND  Final   Special Requests   Final    BOTTLES DRAWN AEROBIC ONLY Blood Culture results may not be optimal due to an inadequate volume of blood received in culture bottles   Culture   Final    NO GROWTH 4 DAYS Performed at [facility], [address] [place], [place] [address]    Report Status PENDING  Incomplete  Culture, blood (Routine X 2) w Reflex to ID Panel     Status: None (Preliminary result)   Collection Time: 01/28/20  1:01 PM   Specimen: BLOOD RIGHT WRIST  Result Value Ref Range Status   Specimen Description BLOOD RIGHT WRIST  Final   Special Requests   Final    BOTTLES DRAWN AEROBIC AND ANAEROBIC Blood Culture adequate volume   Culture   Final    NO GROWTH 4 DAYS Performed at [facility], [address] [place], [place] [address]    Report Status PENDING  Incomplete  MRSA PCR Screening     Status: None   Collection Time: 01/29/20 11:17 AM   Specimen: Nasal Mucosa; Nasopharyngeal  Result Value Ref Range Status   MRSA by PCR NEGATIVE NEGATIVE Final    Comment:        The GeneXpert MRSA Assay  (FDA approved for NASAL specimens only), is one component of a comprehensive MRSA colonization surveillance program. It is not intended to diagnose MRSA infection nor to guide or monitor treatment for MRSA infections. Performed at [facility], [address], [place], [place] [address]          Radiology Studies: [facility] 1 View  Result Date: 01/31/2020 CLINICAL DATA:  Respiratory failure. EXAM: PORTABLE CHEST 1 VIEW COMPARISON:  01/29/2020. FINDINGS: Endotracheal tube and NG tube in stable position. Cardiomegaly with pulmonary venous congestion. Bilateral interstitial prominence again noted. This could be secondary to CHF and or pneumonitis. Tiny left pleural effusion cannot be excluded. No pneumothorax. IMPRESSION: 1. Lines and tubes in stable position. 2. Cardiomegaly with pulmonary venous congestion. Bilateral interstitial prominence again noted. This could be secondary to interstitial edema and or pneumonitis. Tiny left pleural effusion cannot be excluded. Electronically Signed   By: [place]   On: 01/31/2020 05:15        Scheduled Meds:  acetylcysteine  1 mL Nebulization QID   arformoterol  15 mcg  Nebulization BID   benztropine  0.5 mg Per Tube BID   budesonide (PULMICORT) nebulizer solution  0.5 mg Nebulization BID   chlorhexidine gluconate (MEDLINE KIT)  15 mL Mouth Rinse BID   Chlorhexidine Gluconate Cloth  6 each Topical Daily   cloNIDine  0.2 mg Per Tube TID   diltiazem  60 mg Per Tube Q6H   docusate  100 mg Per Tube BID   feeding supplement (VITAL HIGH PROTEIN)  1,000 mL Per Tube P00F   folic acid  1 mg Per Tube Daily   insulin aspart  0-9 Units Subcutaneous Q4H   mouth rinse  15 mL Mouth Rinse 10 times per day   nicotine  7 mg Transdermal Daily   pantoprazole (PROTONIX) IV  40 mg Intravenous Q24H   polyethylene glycol  17 g Oral Daily   thiamine  100 mg Oral Daily   Or   thiamine  100 mg Intravenous Daily   valproic acid  500  mg Per Tube BID   Continuous Infusions:  sodium chloride 10 mL/hr at 01/29/20 1401    ceFAZolin (ANCEF) IV 2 g (02/01/20 0514)   heparin 2,250 Units/hr (02/01/20 0118)   lactated ringers 75 mL/hr at 01/31/20 2336   midazolam 2 mg/hr (01/31/20 2338)   vancomycin 2,000 mg (02/01/20 1139)     LOS: 6 days    Time spent: 35 minutes    Nicodemus Denk D Manuella Ghazi, DO [facility]  If 7PM-7AM, please contact night-coverage [email or web address] 02/01/2020, 12:00 PM

## 2020-02-02 ENCOUNTER — Inpatient Hospital Stay (HOSPITAL_COMMUNITY): Payer: Medicaid Other

## 2020-02-02 LAB — CBC
HCT: 39.3 % (ref 36.0–46.0)
Hemoglobin: 11.5 g/dL — ABNORMAL LOW (ref 12.0–15.0)
MCH: 28.5 pg (ref 26.0–34.0)
MCHC: 29.3 g/dL — ABNORMAL LOW (ref 30.0–36.0)
MCV: 97.3 fL (ref 80.0–100.0)
Platelets: 178 10*3/uL (ref 150–400)
RBC: 4.04 MIL/uL (ref 3.87–5.11)
RDW: 16.1 % — ABNORMAL HIGH (ref 11.5–15.5)
WBC: 10.8 10*3/uL — ABNORMAL HIGH (ref 4.0–10.5)
nRBC: 0 % (ref 0.0–0.2)

## 2020-02-02 LAB — BLOOD GAS, ARTERIAL
Acid-Base Excess: 16.4 mmol/L — ABNORMAL HIGH (ref 0.0–2.0)
Bicarbonate: 38.8 mmol/L — ABNORMAL HIGH (ref 20.0–28.0)
FIO2: 60
O2 Saturation: 97.3 %
Patient temperature: 37.6
pCO2 arterial: 67.7 mmHg (ref 32.0–48.0)
pH, Arterial: 7.412 (ref 7.350–7.450)
pO2, Arterial: 98 mmHg (ref 83.0–108.0)

## 2020-02-02 LAB — BASIC METABOLIC PANEL WITH GFR
Anion gap: 8 (ref 5–15)
BUN: 28 mg/dL — ABNORMAL HIGH (ref 8–23)
CO2: 38 mmol/L — ABNORMAL HIGH (ref 22–32)
Calcium: 8.7 mg/dL — ABNORMAL LOW (ref 8.9–10.3)
Chloride: 96 mmol/L — ABNORMAL LOW (ref 98–111)
Creatinine, Ser: 0.37 mg/dL — ABNORMAL LOW (ref 0.44–1.00)
GFR calc Af Amer: >60 mL/min
GFR calc non Af Amer: >60 mL/min
Glucose, Bld: 155 mg/dL — ABNORMAL HIGH (ref 70–99)
Potassium: 4.3 mmol/L (ref 3.5–5.1)
Sodium: 142 mmol/L (ref 135–145)

## 2020-02-02 LAB — TRIGLYCERIDES: Triglycerides: 129 mg/dL (ref <150)

## 2020-02-02 LAB — HEPARIN LEVEL (UNFRACTIONATED): Heparin Unfractionated: <0.1 IU/mL — ABNORMAL LOW (ref 0.30–0.70)

## 2020-02-02 LAB — GLUCOSE, CAPILLARY
Glucose-Capillary: 163 mg/dL — ABNORMAL HIGH (ref 70–99)
Glucose-Capillary: 139 mg/dL — ABNORMAL HIGH (ref 70–99)
Glucose-Capillary: 182 mg/dL — ABNORMAL HIGH (ref 70–99)
Glucose-Capillary: 162 mg/dL — ABNORMAL HIGH (ref 70–99)
Glucose-Capillary: 136 mg/dL — ABNORMAL HIGH (ref 70–99)

## 2020-02-02 MED ORDER — FUROSEMIDE 10 MG/ML IJ SOLN
40.0000 mg | Freq: Two times a day (BID) | INTRAMUSCULAR | Status: DC
  Administered 2020-02-02 – 2020-02-03 (×3): 40 mg via INTRAVENOUS
  Filled 2020-02-02 (×3): qty 4

## 2020-02-02 NOTE — Progress Notes (Signed)
ANTICOAGULATION CONSULT NOTE - [place] for Heparin Indication: atrial fibrillation  Allergies  Allergen Reactions  . Bactrim [Sulfamethoxazole-Trimethoprim] Anaphylaxis  . Amoxicillin Hives  . Diclofenac Tinitus    swelling in feet  . Morphine And Related Nausea And Vomiting and Other (See Comments)    headache  . Potassium-Containing Compounds Itching    Patient Measurements: Height: 5\' 5"  (165.1 cm) Weight: 106.3 kg (234 lb 5.6 oz) IBW/kg (Calculated) : 57 Heparin Dosing Weight:  80 kg  Vital Signs: Temp: 98.9 F (37.2 C) (09/12 1144) Temp Source: Rectal (09/12 1144) BP: 126/49 (09/12 1000) Pulse Rate: 81 (09/12 1144)  Labs: Recent Labs    01/31/20 0607 01/31/20 0607 01/31/20 1123 01/31/20 1918 02/01/20 0613 02/02/20 0710  HGB 11.8*   < >  --   --  11.7* 11.5*  HCT 40.0  --   --   --  40.0 39.3  PLT 136*  --   --   --  153 178  HEPARINUNFRC  --   --    < > 0.36 0.39 <0.10*  CREATININE 0.51  --   --   --  0.38* 0.37*   < > = values in this interval not displayed.    Estimated Creatinine Clearance: 87.2 mL/min (A) (by C-G formula based on SCr of 0.37 mg/dL (L)).  Assessment: Pharmacy was consulted to dose heparin in 64 yr old female with atrial fibrillation.  Patient was not on anticoagulation prior to admission.   02-02-20 update Heparin level: <0.10 IU/mL, undetectable on heparin at 2250 units/hr d/t drip being turned off ~1hr CBC: Hb: 11.8>11.7> 11.5 (stable)  Plates 190>206>161 RN reports no bleeding complications or issues with infusion site   Goal of Therapy:  Heparin level 0.3-0.7 units/ml Monitor platelets by anticoagulation protocol: Yes   Plan:  Continue heparin infusion rate at 2250 units/hr  Monitor daily heparin level, CBC Monitor for signs/symptoms of bleeding   Despina Pole, Pharm. D. Clinical Pharmacist 02/02/2020 1:14 PM

## 2020-02-02 NOTE — Progress Notes (Signed)
PROGRESS NOTE    [name] [name]  CBS:[id number] DOB: November 17, 1955 DOA: 01/26/2020 PCP: Leonard Downing, MD   Brief Narrative:  Per HPI: [name] [name] a 64 y.o.femalewith medical history significant forCOPD on 2L [place] at bedtime, hypertension, type 2 diabetes, obesity, bipolar disorder/anxiety disorder, ongoing tobacco use,questionable ongoing alcohol abuse,and prior polysubstance abuse with methamphetamine and alcohol who presented to the ED with progressive worsening of pain, erythema, and serosanguineous drainage from her right second toe. This began to gradually develop after her daughter trimmed her nail too close to her skin approximately 2 weeks ago. She has noticed some fever, chills, body aches, and mild headache as well as general malaise at home. She tried soaking her foot at home with no improvement in her symptoms. She denies any chest pain, shortness of breath, abdominal pain, nausea, vomiting, or dysuria. She denies any upper respiratory symptoms as well. She has not been vaccinated for COVID-19.  ED Course:Vital signs demonstrate ongoing sinus tachycardia and this is confirmed on EKG with heart rate approximately 110 bpm. Temperature 1-1.5 Fahrenheit. She is noted to have a leukocytosis of 12,900 as well as a thrombocytopenia of 129,000. BUN is 38 and creatinine is elevated at 1.1 with baseline 0.3-0.5. Chest x-ray with mild cardiomegaly and no other acute findings. Covid testing negative. Right foot x-ray with no soft tissue swelling or signs of osteomyelitis noted. Lactic acid is 1.5. Patient has been ordered and started 3 L fluid bolus as well as vancomycin and Rocephin for suspected sepsis secondary to cellulitis.  9/8:Patient remains comfortable on ventilator and is noted to be in sinus rhythm this morning. Plan to discontinue IV Cardizem drip and transition to oral as ordered. 2D echocardiogram with LVEF 55-60% with no vegetations noted. She is  being treated with Ancef for group A strep bacteremia related to cellulitis. Plan to consult cardiology today for planning of TEE. Start tube feedings as well. Appreciate further PCCM recommendations.  9/9:Patient remains comfortable on the ventilator and at 40% FiO2. Patient remains in sinus rhythm with Cardizem as ordered by cardiology. Plans for TEE once patient stable and off ventilator.  9/10:Patient is responsive this morning on the ventilator at FiO2 40%. She continues to remain on sinus rhythm with heart rate control that appears adequate. Trial of extubation today per PCCM. Continue current antibiotics.  9/11:Patient remains on ventilator this morning and could not extubate yesterday.  Right lower extremity appears more swollen with worsening cellulitis.  Discussed case with general surgery who has evaluated patient with recommendation for CT of the lower extremity which is still pending.  Plan to add vancomycin on board as well.  Continue monitoring.  9/12: Patient noted to have worsening hypoxemia this morning and is requiring FiO2 100%.  Fluid balance is +9 L.  Will discontinue IV fluid and try Lasix today.  Chest x-ray with coarse interstitial markings that appear unchanged.  CT of the lower extremity with diffuse soft tissue edema and findings of moderate mild fasciitis noted.  Continue vancomycin and Ancef as previously ordered.  Assessment & Plan:   Active Problems:   Acute respiratory failure with hypoxia and hypercapnia (HCC)   Sepsis (HCC)   Cellulitis of right lower leg  1-sepsis: In the setting of Streptococcus cellulitis/bacteremia. Present on admission -Patient met sepsis criteria on admission with sinus tachycardia, elevated white blood cells, febrile and with identified source of infection of cellulitic process. She had acute kidney injury as organ dysfunction. -No signs of osteomyelitis onfootchest x-ray -Blood culture positive  for Streptococcus -Continue  to follow ESR and CRP -Continue on Ancefbased on sensitivities and add vancomycin 9/11 for additional coverage -CT R LE on 9/11 to assess for abscess -Transthoracic echocardiogram with LVEF 55 to 60% and no vegetations noted, appreciate cardiology consultation for TEEonce extubated -CT right lower extremity on 9/11 with findings of diffuse soft tissue edema and moderate fasciitis, no abscess or fluid collection noted. -Continue as needed antipyretics and supportive care.  2-acute on chronic respiratory failure with hypoxia and hypercapnias/p intubation -Appears to be a combination of COPD exacerbation, Underlying obesity hypoventilation syndrome and component of interstitial pulmonary edema/vascular congestion after fluid resuscitation as per sepsis protocol. -Worsening hypoxemia on 9/12.  Plan to hold IV fluid and start Lasix. -PCCM followingwith likely trial of extubation today.  3-acute kidney injury-resolved -Baseline creatinine 0.6-0.7, currently around0.38 -Monitor labs closely with Lasix for diuresis -Continue tube feeds and fluid resuscitation  4-mild thrombocytopenia-resolved -Likely related to acute sepsis and underlying history of alcohol abuse -No signs of overt bleeding -Monitor carefully while on heparin drip -Repeat CBC in a.m.  5-alcohol abuse -Continue CIWA monitoring -Continue thiamine and folic acid.  6-class II obesity  -Body mass index is 36.58 kg/m. -Low calorie diet, portion control and increase physical activity discussed with patient.  7-history of hypertension-stable -Monitor closely with IV Lasix ordered -Clonidine 3 times daily per PCCM -Hydrocortisone discontinued. -Continue oral Cardizem -Labetalol as needed  8-history of tobacco abuse -Cessation counseling has been provided. -Continue nicotine patch.  9-depression/anxiety/history of seizures -Plan is to resume psychotropic home medications once able to take by mouth. -Continue  Depakote  10-type 2 diabetes mellitus -A1c 5.8 -While inpatient continue sliding scale insulin -At discharge will resume the use of Metformin in order to continue assisting with management of insulin resistant and obesity. -Modified carbohydrate diet and lifestylerecommended after discharge.  11-new onset atrial fibrillation with RVR-currently in sinus rhythm -Continue oral Cardizem as ordered -TSH 0.294, check free T3 0.7, T4 0.75 -2D echocardiogram with LVEF 55-60% -CHA2DS2-VASc score of 3 for which heparin drip had been started -Appreciatecardiology recommendations for eventual TEE once extubated  12-Hypokalemia-resolved -Monitor  DVT prophylaxis:Heparin drip Code Status:Full Family Communication:Discussed with daughter at bedside9/10 Disposition Plan: Status is: Inpatient  Remains inpatient appropriate because:Ongoing diagnostic testing needed not appropriate for outpatient work up, IV treatments appropriate due to intensity of illness or inability to take PO and Inpatient level of care appropriate due to severity of illness   Dispo: The patient is from:Home Anticipated d/c is to:SNF Anticipated d/c date is: > 3 days Patient currently is not medically stable to d/c.Patient currently remains intubated on the ventilator and will need further evaluation per cardiology along with ongoing evaluation per PCCM.  Patient is noted to be volume overloaded and will require IV Lasix.  Consultants:  PCCM  Cardiology  Procedures:  2D echocardiogram 9/8 with LVEF 55-60%. No vegetations.  Antimicrobials:  Anti-infectives (From admission, onward)   Start     Dose/Rate Route Frequency Ordered Stop   02/02/20 1100  vancomycin (VANCOREADY) IVPB 1500 mg/300 mL        1,500 mg 150 mL/hr over 120 Minutes Intravenous Every 24 hours 02/01/20 1225     02/01/20 1130  vancomycin (VANCOREADY) IVPB 2000 mg/400 mL        2,000 mg 200  mL/hr over 120 Minutes Intravenous  Once 02/01/20 1108 02/01/20 2010   01/27/20 1500  vancomycin (VANCOREADY) IVPB 1500 mg/300 mL  Status:  Discontinued  1,500 mg 150 mL/hr over 120 Minutes Intravenous Every 24 hours 01/26/20 1428 01/27/20 0827   01/27/20 0830  ceFAZolin (ANCEF) IVPB 2g/100 mL premix  Status:  Discontinued        2 g 200 mL/hr over 30 Minutes Intravenous Every 8 hours 01/27/20 0827 02/01/20 1222   01/26/20 1545  vancomycin (VANCOCIN) IVPB 1000 mg/200 mL premix  Status:  Discontinued        1,000 mg 200 mL/hr over 60 Minutes Intravenous  Once 01/26/20 1541 01/26/20 1553   01/26/20 1415  vancomycin (VANCOCIN) IVPB 1000 mg/200 mL premix  Status:  Discontinued        1,000 mg 200 mL/hr over 60 Minutes Intravenous  Once 01/26/20 1408 01/26/20 1413   01/26/20 1415  cefTRIAXone (ROCEPHIN) 2 g in sodium chloride 0.9 % 100 mL IVPB        2 g 200 mL/hr over 30 Minutes Intravenous  Once 01/26/20 1408 01/26/20 2011   01/26/20 1415  vancomycin (VANCOREADY) IVPB 2000 mg/400 mL        2,000 mg 200 mL/hr over 120 Minutes Intravenous  Once 01/26/20 1413 01/26/20 2011       Subjective: Patient seen and evaluated today and is sedated on ventilator.  Her oxygen requirements have increased this morning.  She appears to be volume overloaded.  Objective: Vitals:   02/02/20 0801 02/02/20 0900 02/02/20 1000 02/02/20 1144  BP:  (!) 132/56 (!) 126/49   Pulse:  93 91 81  Resp:  (!) 22 (!) 21 (!) 24  Temp:    98.9 F (37.2 C)  TempSrc:    Rectal  SpO2: 99% 100% 100% 99%  Weight:      Height:        Intake/Output Summary (Last 24 hours) at 02/02/2020 1214 Last data filed at 02/02/2020 0749 Gross per 24 hour  Intake 6429.64 ml  Output 1260 ml  Net 5169.64 ml   Filed Weights   01/31/20 0500 02/01/20 0200 02/02/20 0445  Weight: 100.8 kg 105.1 kg 106.3 kg    Examination:  General exam: Sedated on ventilator Respiratory system: Coarse breath sounds bilaterally.  Intubated  with FiO2 100% Cardiovascular system: S1 & S2 heard, RRR.  Gastrointestinal system: Abdomen is nondistended, soft and nontender. Central nervous system: Sedated Extremities: Right lower extremity worsening edema as well as erythema. Skin: No rashes, lesions or ulcers Psychiatry: Cannot be assessed.    Data Reviewed: I have personally reviewed following labs and imaging studies  CBC: Recent Labs  Lab 01/26/20 1350 01/27/20 0829 01/29/20 0908 01/30/20 0505 01/31/20 0607 02/01/20 0613 02/02/20 0710  WBC 12.9*   < > 12.6* 14.2* 11.0* 10.0 10.8*  NEUTROABS 11.3*  --   --   --   --   --   --   HGB 15.3*   < > 13.4 12.7 11.8* 11.7* 11.5*  HCT 48.8*   < > 44.6 42.3 40.0 40.0 39.3  MCV 92.4   < > 94.1 94.4 95.5 95.9 97.3  PLT 129*   < > 103* 113* 136* 153 178   < > = values in this interval not displayed.   Basic Metabolic Panel: Recent Labs  Lab 01/28/20 0404 01/29/20 5027 01/29/20 0908 01/29/20 1632 01/30/20 0505 01/30/20 1512 01/31/20 0607 02/01/20 0613 02/02/20 0710  NA 142  --  143  --  140  --  142 142 142  K 3.7  --  3.0*  --  3.5  --  3.4* 4.2 4.3  CL 99  --  96*  --  97*  --  98 97* 96*  CO2 30  --  34*  --  34*  --  35* 35* 38*  GLUCOSE 171*  --  152*  --  204*  --  192* 156* 155*  BUN 36*  --  63*  --  72*  --  51* 32* 28*  CREATININE 0.61   < > 1.03*  --  0.62  --  0.51 0.38* 0.37*  CALCIUM 9.0  --  8.8*  --  8.9  --  8.9 8.6* 8.7*  MG 2.2  --  2.3 2.4 2.2 2.1  --   --   --   PHOS  --   --  2.3* 1.9* 1.9* 2.3*  --   --   --    < > = values in this interval not displayed.   GFR: Estimated Creatinine Clearance: 87.2 mL/min (A) (by C-G formula based on SCr of 0.37 mg/dL (L)). Liver Function Tests: Recent Labs  Lab 01/26/20 1350 01/27/20 0829  AST 14* 12*  ALT 17 14  ALKPHOS 96 86  BILITOT 0.5 0.2*  PROT 7.8 6.7  ALBUMIN 3.7 2.9*   No results for input(s): LIPASE, AMYLASE in the last 168 hours. No results for input(s): AMMONIA in the last 168  hours. Coagulation Profile: Recent Labs  Lab 01/26/20 1350 01/27/20 0829  INR 1.1 1.2   Cardiac Enzymes: No results for input(s): CKTOTAL, CKMB, CKMBINDEX, TROPONINI in the last 168 hours. BNP (last 3 results) No results for input(s): PROBNP in the last 8760 hours. HbA1C: No results for input(s): HGBA1C in the last 72 hours. CBG: Recent Labs  Lab 02/01/20 2028 02/01/20 2313 02/02/20 0439 02/02/20 0751 02/02/20 1145  GLUCAP 142* 161* 163* 139* 182*   Lipid Profile: Recent Labs    02/01/20 0613 02/02/20 0710  TRIG 124 129   Thyroid Function Tests: No results for input(s): TSH, T4TOTAL, FREET4, T3FREE, THYROIDAB in the last 72 hours. Anemia Panel: No results for input(s): VITAMINB12, FOLATE, FERRITIN, TIBC, IRON, RETICCTPCT in the last 72 hours. Sepsis Labs: Recent Labs  Lab 01/26/20 1350 01/26/20 1611 01/27/20 0829  PROCALCITON  --   --  3.34  LATICACIDVEN 1.5 0.8 0.8    Recent Results (from the past 240 hour(s))  SARS Coronavirus 2 by RT PCR (hospital order, performed in [facility] hospital lab) Nasopharyngeal Nasopharyngeal Swab     Status: None   Collection Time: 01/26/20  1:17 PM   Specimen: Nasopharyngeal Swab  Result Value Ref Range Status   SARS Coronavirus 2 NEGATIVE NEGATIVE Final    Comment: (NOTE) SARS-CoV-2 target nucleic acids are NOT DETECTED.  The SARS-CoV-2 RNA is generally detectable in upper and lower respiratory specimens during the acute phase of infection. The lowest concentration of SARS-CoV-2 viral copies this assay can detect is 250 copies / mL. A negative result does not preclude SARS-CoV-2 infection and should not be used as the sole basis for treatment or other patient management decisions.  A negative result may occur with improper specimen collection / handling, submission of specimen other than nasopharyngeal swab, presence of viral mutation(s) within the areas targeted by this assay, and inadequate number of viral  copies (<250 copies / mL). A negative result must be combined with clinical observations, patient history, and epidemiological information.  Fact Sheet for Patients:   StrictlyIdeas.no  Fact Sheet for Healthcare Providers: BankingDealers.co.za  This test is not yet approved or  cleared  by the [place] and has been authorized for detection and/or diagnosis of SARS-CoV-2 by FDA under an Emergency Use Authorization (EUA).  This EUA will remain in effect (meaning this test can be used) for the duration of the COVID-19 declaration under Section 564(b)(1) of the Act, 21 U.S.C. section 360bbb-3(b)(1), unless the authorization is terminated or revoked sooner.  Performed at [facility], [address] [place], [place] [address]   Blood Culture (routine x 2)     Status: Abnormal   Collection Time: 01/26/20  1:50 PM   Specimen: Right Antecubital; Blood  Result Value Ref Range Status   Specimen Description   Final    RIGHT ANTECUBITAL BOTTLES DRAWN AEROBIC AND ANAEROBIC Performed at [facility], [address], [place], [place] [address]    Special Requests   Final    Blood Culture adequate volume Performed at [facility], [address] [place], [place] [address]    Culture  Setup Time   Final    GRAM POSITIVE COCCI Gram Stain Report Called to,Read Back By and Verified With: KILMER,T @ 9179 ON 01/27/20 BY JUW IN BOTH AEROBIC AND ANAEROBIC BOTTLES GS DONE @ [facility] Organism ID to follow Performed at [facility], [place] [address] [place], [place] [address]    Culture GROUP A STREP (S.PYOGENES) ISOLATED (A)  Final   Report Status 01/29/2020 FINAL  Final   Organism ID, Bacteria GROUP A STREP (S.PYOGENES) ISOLATED  Final      Susceptibility   Group a strep (s.pyogenes) isolated - MIC*    PENICILLIN <=0.06 SENSITIVE Sensitive     CEFTRIAXONE <=0.12 SENSITIVE Sensitive     ERYTHROMYCIN >=8 RESISTANT Resistant     LEVOFLOXACIN 0.5  SENSITIVE Sensitive     VANCOMYCIN <=0.12 SENSITIVE Sensitive     * GROUP A STREP (S.PYOGENES) ISOLATED  Blood Culture ID Panel (Reflexed)     Status: Abnormal   Collection Time: 01/26/20  1:50 PM  Result Value Ref Range Status   Enterococcus faecalis NOT DETECTED NOT DETECTED Final   Enterococcus Faecium NOT DETECTED NOT DETECTED Final   Listeria monocytogenes NOT DETECTED NOT DETECTED Final   Staphylococcus species NOT DETECTED NOT DETECTED Final   Staphylococcus aureus (BCID) NOT DETECTED NOT DETECTED Final   Staphylococcus epidermidis NOT DETECTED NOT DETECTED Final   Staphylococcus lugdunensis NOT DETECTED NOT DETECTED Final   Streptococcus species DETECTED (A) NOT DETECTED Final    Comment: CRITICAL RESULT CALLED TO, READ BACK BY AND VERIFIED WITH: G. Coffee PharmD 8:25 01/27/20 ([email or web address])    Streptococcus agalactiae NOT DETECTED NOT DETECTED Final   Streptococcus pneumoniae NOT DETECTED NOT DETECTED Final   Streptococcus pyogenes DETECTED (A) NOT DETECTED Final    Comment: CRITICAL RESULT CALLED TO, READ BACK BY AND VERIFIED WITH: G. Coffee PharmD 8:25 01/27/20 ([email or web address])    A.calcoaceticus-baumannii NOT DETECTED NOT DETECTED Final   Bacteroides fragilis NOT DETECTED NOT DETECTED Final   Enterobacterales NOT DETECTED NOT DETECTED Final   Enterobacter cloacae complex NOT DETECTED NOT DETECTED Final   Escherichia coli NOT DETECTED NOT DETECTED Final   Klebsiella aerogenes NOT DETECTED NOT DETECTED Final   Klebsiella oxytoca NOT DETECTED NOT DETECTED Final   Klebsiella pneumoniae NOT DETECTED NOT DETECTED Final   Proteus species NOT DETECTED NOT DETECTED Final   Salmonella species NOT DETECTED NOT DETECTED Final   Serratia marcescens NOT DETECTED NOT DETECTED Final   Haemophilus influenzae NOT DETECTED NOT DETECTED Final   Neisseria meningitidis NOT DETECTED NOT DETECTED  Final   Pseudomonas aeruginosa NOT DETECTED NOT DETECTED Final   Stenotrophomonas maltophilia NOT DETECTED NOT  DETECTED Final   Candida albicans NOT DETECTED NOT DETECTED Final   Candida auris NOT DETECTED NOT DETECTED Final   Candida glabrata NOT DETECTED NOT DETECTED Final   Candida krusei NOT DETECTED NOT DETECTED Final   Candida parapsilosis NOT DETECTED NOT DETECTED Final   Candida tropicalis NOT DETECTED NOT DETECTED Final   Cryptococcus neoformans/gattii NOT DETECTED NOT DETECTED Final    Comment: Performed at [facility], [place] [address] [place], [place] [address]  Blood Culture (routine x 2)     Status: Abnormal   Collection Time: 01/26/20  2:00 PM   Specimen: Left Antecubital; Blood  Result Value Ref Range Status   Specimen Description   Final    LEFT ANTECUBITAL BOTTLES DRAWN AEROBIC AND ANAEROBIC Performed at [facility], [address], [place], [name] [address]    Special Requests   Final    Blood Culture adequate volume Performed at [facility], [address] [place] [address], [place], [place] [address]    Culture  Setup Time   Final    GRAM POSITIVE COCCI Gram Stain Report Called to,Read Back By and Verified With: KILMER,T @ 1102 ON 01/27/20 BY JUW AEROBIC BOTTLE ONLY GS DONE @ [facility] Performed at [facility], [address], [place], [place] [address]    Culture (A)  Final    GROUP A STREP (S.PYOGENES) ISOLATED SUSCEPTIBILITIES PERFORMED ON PREVIOUS CULTURE WITHIN THE LAST 5 DAYS. Performed at [facility], [place] [address] [place], [place] [address]    Report Status 01/29/2020 FINAL  Final  Culture, blood (Routine X 2) w Reflex to ID Panel     Status: None (Preliminary result)   Collection Time: 01/28/20  1:01 PM   Specimen: BLOOD RIGHT HAND  Result Value Ref Range Status   Specimen Description BLOOD RIGHT HAND  Final   Special Requests   Final    BOTTLES DRAWN AEROBIC ONLY Blood Culture results may not be optimal due to an inadequate volume of blood received in culture bottles   Culture   Final    NO GROWTH 4 DAYS Performed at [facility], [address],  [place], [place] [address]    Report Status PENDING  Incomplete  Culture, blood (Routine X 2) w Reflex to ID Panel     Status: None (Preliminary result)   Collection Time: 01/28/20  1:01 PM   Specimen: BLOOD RIGHT WRIST  Result Value Ref Range Status   Specimen Description BLOOD RIGHT WRIST  Final   Special Requests   Final    BOTTLES DRAWN AEROBIC AND ANAEROBIC Blood Culture adequate volume   Culture   Final    NO GROWTH 4 DAYS Performed at [facility], [address] [place], [place] [address]    Report Status PENDING  Incomplete  MRSA PCR Screening     Status: None   Collection Time: 01/29/20 11:17 AM   Specimen: Nasal Mucosa; Nasopharyngeal  Result Value Ref Range Status   MRSA by PCR NEGATIVE NEGATIVE Final    Comment:        The GeneXpert MRSA Assay (FDA approved for NASAL specimens only), is one component of a comprehensive MRSA colonization surveillance program. It is not intended to diagnose MRSA infection nor to guide or monitor treatment for MRSA infections. Performed at [facility], [address] [place], [place] [address]  Radiology Studies: CT TIBIA FIBULA RIGHT W CONTRAST  Result Date: 02/01/2020 CLINICAL DATA:  Right lower extremity pain, swelling and redness. EXAM: CT OF THE LOWER RIGHT EXTREMITY WITH CONTRAST TECHNIQUE: Multidetector CT imaging of the lower right extremity was performed according to the standard protocol following intravenous contrast administration. COMPARISON:  Radiographs 01/26/2020 CONTRAST:  50m OMNIPAQUE IOHEXOL 300 MG/ML  SOLN FINDINGS: The knee and ankle joints are maintained. There are moderate degenerative changes most notably involving the medial compartment of the knee joint. No fracture or osteochondral lesion is identified. The tibia and fibula are intact. No fracture or destructive bony changes. Small knee joint effusion is noted. No erosive findings. No findings suspicious for septic arthritis. Diffuse subcutaneous soft  tissue swelling/edema/fluid and skin thickening consistent with cellulitis. I do not see a discrete drainable fluid collection to suggest an abscess. No gas is seen in the soft tissues. There is also moderate myofasciitis involving the mid to lower calf musculature with fluid in the fascial planes. I do not see any obvious changes of pyomyositis. Suggesting IMPRESSION: 1. Diffuse subcutaneous soft tissue swelling/edema/fluid and skin thickening consistent with cellulitis. No discrete drainable fluid collection to suggest an abscess. 2. Moderate myofasciitis involving the mid to lower calf musculature with fluid in the fascial planes. No obvious changes of pyomyositis. 3. No CT findings suspicious for septic arthritis or osteomyelitis. 4. Small knee joint effusion. Electronically Signed   By: PMarijo SanesM.D.   On: 02/01/2020 10:56   DG CHEST PORT 1 VIEW  Result Date: 02/02/2020 CLINICAL DATA:  Intubated EXAM: PORTABLE CHEST 1 VIEW COMPARISON:  01/31/2020 FINDINGS: Endotracheal tube and NG tube unchanged. Stable cardiac silhouette. Coarse interstitial lung markings are unchanged. No pneumothorax. No focal consolidation. IMPRESSION: 1. Stable support apparatus. 2. Coarse interstitial lung markings. Electronically Signed   By: SSuzy BouchardM.D.   On: 02/02/2020 08:50        Scheduled Meds: . acetylcysteine  2 mL Nebulization Q6H  . arformoterol  15 mcg Nebulization BID  . benztropine  0.5 mg Per Tube BID  . budesonide (PULMICORT) nebulizer solution  0.5 mg Nebulization BID  . chlorhexidine gluconate (MEDLINE KIT)  15 mL Mouth Rinse BID  . Chlorhexidine Gluconate Cloth  6 each Topical Daily  . cloNIDine  0.2 mg Per Tube TID  . diltiazem  60 mg Per Tube Q6H  . docusate  100 mg Per Tube BID  . feeding supplement (VITAL HIGH PROTEIN)  1,000 mL Per Tube Q24H  . folic acid  1 mg Per Tube Daily  . insulin aspart  0-9 Units Subcutaneous Q4H  . levalbuterol  0.63 mg Nebulization Q6H  . mouth rinse   15 mL Mouth Rinse 10 times per day  . nicotine  7 mg Transdermal Daily  . pantoprazole (PROTONIX) IV  40 mg Intravenous Q24H  . polyethylene glycol  17 g Oral Daily  . thiamine  100 mg Oral Daily   Or  . thiamine  100 mg Intravenous Daily  . valproic acid  500 mg Per Tube BID   Continuous Infusions: . sodium chloride 10 mL/hr at 01/29/20 1401  . heparin 2,250 Units/hr (02/02/20 0447)  . lactated ringers 75 mL/hr at 02/02/20 0457  . midazolam 3.5 mg/hr (02/02/20 0447)  . vancomycin 1,500 mg (02/02/20 1113)     LOS: 7 days    Time spent: 40 minutes    Vineta Carone D SManuella Ghazi DO [facility]  If 7PM-7AM, please contact night-coverage [email or web address] 02/02/2020, 12:14 PM

## 2020-02-03 ENCOUNTER — Inpatient Hospital Stay (HOSPITAL_COMMUNITY): Payer: Medicaid Other

## 2020-02-03 DIAGNOSIS — R7881 Bacteremia: Secondary | ICD-10-CM

## 2020-02-03 DIAGNOSIS — A401 Sepsis due to streptococcus, group B: Principal | ICD-10-CM

## 2020-02-03 DIAGNOSIS — G9341 Metabolic encephalopathy: Secondary | ICD-10-CM

## 2020-02-03 DIAGNOSIS — B951 Streptococcus, group B, as the cause of diseases classified elsewhere: Secondary | ICD-10-CM

## 2020-02-03 DIAGNOSIS — J441 Chronic obstructive pulmonary disease with (acute) exacerbation: Secondary | ICD-10-CM

## 2020-02-03 LAB — GLUCOSE, CAPILLARY
Glucose-Capillary: 183 mg/dL — ABNORMAL HIGH (ref 70–99)
Glucose-Capillary: 162 mg/dL — ABNORMAL HIGH (ref 70–99)
Glucose-Capillary: 127 mg/dL — ABNORMAL HIGH (ref 70–99)
Glucose-Capillary: 105 mg/dL — ABNORMAL HIGH (ref 70–99)
Glucose-Capillary: 140 mg/dL — ABNORMAL HIGH (ref 70–99)
Glucose-Capillary: 123 mg/dL — ABNORMAL HIGH (ref 70–99)

## 2020-02-03 LAB — URINE CULTURE: Culture: NO GROWTH

## 2020-02-03 LAB — CBC
HCT: 37.6 % (ref 36.0–46.0)
Hemoglobin: 10.8 g/dL — ABNORMAL LOW (ref 12.0–15.0)
MCH: 27.9 pg (ref 26.0–34.0)
MCHC: 28.7 g/dL — ABNORMAL LOW (ref 30.0–36.0)
MCV: 97.2 fL (ref 80.0–100.0)
Platelets: 181 10*3/uL (ref 150–400)
RBC: 3.87 MIL/uL (ref 3.87–5.11)
RDW: 15.6 % — ABNORMAL HIGH (ref 11.5–15.5)
WBC: 8.9 10*3/uL (ref 4.0–10.5)
nRBC: 0 % (ref 0.0–0.2)

## 2020-02-03 LAB — BASIC METABOLIC PANEL
Anion gap: 9 (ref 5–15)
BUN: 28 mg/dL — ABNORMAL HIGH (ref 8–23)
CO2: 41 mmol/L — ABNORMAL HIGH (ref 22–32)
Calcium: 9.2 mg/dL (ref 8.9–10.3)
Chloride: 92 mmol/L — ABNORMAL LOW (ref 98–111)
Creatinine, Ser: 0.36 mg/dL — ABNORMAL LOW (ref 0.44–1.00)
GFR calc Af Amer: >60 mL/min (ref >60)
GFR calc non Af Amer: >60 mL/min (ref >60)
Glucose, Bld: 180 mg/dL — ABNORMAL HIGH (ref 70–99)
Potassium: 4.1 mmol/L (ref 3.5–5.1)
Sodium: 142 mmol/L (ref 135–145)

## 2020-02-03 LAB — CULTURE, BLOOD (ROUTINE X 2)
Culture: NO GROWTH
Culture: NO GROWTH
Special Requests: ADEQUATE

## 2020-02-03 LAB — HEPARIN LEVEL (UNFRACTIONATED): Heparin Unfractionated: 0.32 IU/mL (ref 0.30–0.70)

## 2020-02-03 LAB — MAGNESIUM: Magnesium: 1.7 mg/dL (ref 1.7–2.4)

## 2020-02-03 LAB — TRIGLYCERIDES: Triglycerides: 145 mg/dL (ref <150)

## 2020-02-03 MED ORDER — ACETAMINOPHEN 325 MG PO TABS
650.0000 mg | ORAL_TABLET | Freq: Four times a day (QID) | ORAL | Status: DC | PRN
  Administered 2020-02-07 – 2020-02-19 (×5): 650 mg
  Filled 2020-02-03 (×7): qty 2

## 2020-02-03 MED ORDER — LEVALBUTEROL HCL 0.63 MG/3ML IN NEBU
0.6300 mg | INHALATION_SOLUTION | Freq: Four times a day (QID) | RESPIRATORY_TRACT | Status: DC | PRN
  Administered 2020-02-04 – 2020-02-16 (×13): 0.63 mg via RESPIRATORY_TRACT
  Filled 2020-02-03 (×15): qty 3

## 2020-02-03 MED ORDER — ACETAMINOPHEN 650 MG RE SUPP: 650.0000 mg | Freq: Four times a day (QID) | RECTAL | Status: DC | PRN

## 2020-02-03 MED ORDER — MAGNESIUM SULFATE 2 GM/50ML IV SOLN
2.0000 g | Freq: Once | INTRAVENOUS | Status: AC
  Administered 2020-02-03: 2 g via INTRAVENOUS
  Filled 2020-02-03: qty 50

## 2020-02-03 MED ORDER — PANTOPRAZOLE SODIUM 40 MG PO PACK
40.0000 mg | PACK | ORAL | Status: DC
  Administered 2020-02-04 – 2020-02-12 (×9): 40 mg
  Filled 2020-02-03 (×9): qty 20

## 2020-02-03 NOTE — Progress Notes (Signed)
Patient had another episode of vomiting. No evidence of vomit in the tube. Looked as if it was bile or bile like. Patient was suctioned orally as well as her airway. The tube feeding was stopped. Patient given Zofran IV. Md notified. Will continue to monitor.

## 2020-02-03 NOTE — Progress Notes (Signed)
Patient's daughter left keys in room, given to security to give to daughter.

## 2020-02-03 NOTE — Progress Notes (Signed)
NAME:  [name] [name], MRN:  [id number], DOB:  04-07-1956, LOS: 8 ADMISSION DATE:  01/26/2020, CONSULTATION DATE:  01/28/20 REFERRING MD:  Ortiz/ [facility], CHIEF COMPLAINT:  resp distress    Brief History   64 yo female smoker presented 9/05 with Rt foot pain and swelling.  This was associated with chills, myalgias from sepsis with cellulitis.  Developed A fib with RV and then hypoxia.  Required intubation 9/07 and PCCM consulted.  Past Medical History  COPD on home oxygen, HTN, DM, Bipolar  Significant Hospital Events   9/05 Admit 9/07 intubated 9/08 back in sinus rhythm  Consults:  Cardiology  Procedures:  ETT 9/07 >>   Significant Diagnostic Tests:   Echo 9/08 >> EF 55 to 60%  CT Rt leg 9/11 >> diffuse subcutaneous soft tissue swelling, moderate myofasciitis involving mid to lower calf, no osteomyelitis or septic arthritis  Micro Data:  COVID 9/05 >> negative MRSA PCR 9/08 >> negative Blood 9/05 >> Group B Streptococcus Blood 9/07 >>   Antimicrobials:  Vancomycin 9/05 Rocephin 9/05 Ancef 9/06 >> 9/10 Vancomycin 9/11 >>   Interim history/subjective:  Remains on vent, heparin gtt, versed gtt.  Objective   Blood pressure (!) 115/46, pulse 79, temperature 98.7 F (37.1 C), temperature source Rectal, resp. rate (!) 22, height 5\' 5"  (1.651 m), weight 105.9 kg, SpO2 97 %.    Vent Mode: PRVC FiO2 (%):  [40 %-50 %] 40 % Set Rate:  [22 bmp] 22 bmp Vt Set:  [450 mL] 450 mL PEEP:  [5 cmH20] 5 cmH20 Plateau Pressure:  [15 cmH20-25 cmH20] 19 cmH20   Intake/Output Summary (Last 24 hours) at 02/03/2020 1353 Last data filed at 02/03/2020 1329 Gross per 24 hour  Intake 3041.96 ml  Output 1620 ml  Net 1421.96 ml   Filed Weights   02/01/20 0200 02/02/20 0445 02/03/20 0500  Weight: 105.1 kg 106.3 kg 105.9 kg    Examination:  General - sedated Eyes - pupils reactive ENT - ETT in place Cardiac - regular rate/rhythm, no murmur Chest - no wheezing Abdomen - soft, non  tender, + bowel sounds Extremities - Rt lower leg in wrap Skin - no rashes Neuro - RASS -2  Resolved Hospital Problem list      Assessment & Plan:   Acute on chronic hypoxic/hypercapnic respiratory failure from acute pulmonary edema in setting of A fib with RVR and COPD exacerbation. - continue brovana, pulmicort, prn xopenex - can d/c mucomyst - f/u CXR - change respiratory rate to 16 - pressure support wean as tolerated  Group B Streptococcal bacteremia 2nd to Rt lower leg cellulitis. - Abx per primary team  Transient A fib with RVR. - back in sinus rhythm  Acute metabolic encephalopathy from sepsis, hypoxia. Hx of ETOH, depression, anxiety, seizures. - versed gtt with prn fentanyl for RASS goal 0 to -1  Best practice:  Diet: tube feeds DVT prophylaxis: heparin gtt GI prophylaxis: protonix Mobility: bed rest Code Status: full  Disposition: ICU  Labs    CMP Latest Ref Rng & Units 02/03/2020 02/02/2020 02/01/2020  Glucose 70 - 99 mg/dL 180(H) 155(H) 156(H)  BUN 8 - 23 mg/dL 28(H) 28(H) 32(H)  Creatinine 0.44 - 1.00 mg/dL 0.36(L) 0.37(L) 0.38(L)  Sodium 135 - 145 mmol/L 142 142 142  Potassium 3.5 - 5.1 mmol/L 4.1 4.3 4.2  Chloride 98 - 111 mmol/L 92(L) 96(L) 97(L)  CO2 22 - 32 mmol/L 41(H) 38(H) 35(H)  Calcium 8.9 - 10.3 mg/dL 9.2 8.7(L) 8.6(L)  Total Protein 6.5 - 8.1 g/dL - - -  Total Bilirubin 0.3 - 1.2 mg/dL - - -  Alkaline Phos 38 - 126 U/L - - -  AST 15 - 41 U/L - - -  ALT 0 - 44 U/L - - -    CBC Latest Ref Rng & Units 02/03/2020 02/02/2020 02/01/2020  WBC 4.0 - 10.5 K/uL 8.9 10.8(H) 10.0  Hemoglobin 12.0 - 15.0 g/dL 10.8(L) 11.5(L) 11.7(L)  Hematocrit 36 - 46 % 37.6 39.3 40.0  Platelets 150 - 400 K/uL 181 178 153    ABG    Component Value Date/Time   PHART 7.412 02/02/2020 1352   PCO2ART 67.7 (HH) 02/02/2020 1352   PO2ART 98.0 02/02/2020 1352   HCO3 38.8 (H) 02/02/2020 1352   O2SAT 97.3 02/02/2020 1352    CBG (last 3)  Recent Labs     02/03/20 0400 02/03/20 0758 02/03/20 1056  GLUCAP 162* 127* 105*    Critical care time: 34 minutes  Chesley Mires, MD [place] Pager - [phone] 02/03/2020, 2:16 PM

## 2020-02-03 NOTE — Progress Notes (Signed)
Patient is resting comfortably and is no longer coughing over ventilator. Patient has had no instances of vomiting since this morning. Tube feed restarted at 57mL/hour. Patient is tolerating it well.

## 2020-02-03 NOTE — Progress Notes (Signed)
      Telemetry reviewed, NSR and no recurrent afib She is on dilt oral 60mg  every 6 hours, hep gtt. Isolated episode in the setting of systemic illness, if no significant recurrence prior to discharge would not start oral anticoag and plan for outpatient monitor. Can consolidate to long acting dilt closer to discharge.   TEE once more stable from a medical standpoint, she does not have an urgent indication.       Merrily Pew, MD  02/03/2020, 8:32 AM

## 2020-02-03 NOTE — Progress Notes (Signed)
Patient had an episode of vomiting. Patient was coughing over tube and started gagging then vomiting.Tube feeding was stopped. Patient was airway and orally suctioned. MD was notified. Orders to hold feeding. Will continue to monitor.

## 2020-02-03 NOTE — Progress Notes (Addendum)
PROGRESS NOTE    [name] [name]  ZGY:[id number] DOB: 1956/03/30 DOA: 01/26/2020 PCP: Leonard Downing, MD   Brief Narrative:  Per HPI: [name] [name] a 64 y.o.femalewith medical history significant forCOPD on 2L [place] at bedtime, hypertension, type 2 diabetes, obesity, bipolar disorder/anxiety disorder, ongoing tobacco use,questionable ongoing alcohol abuse,and prior polysubstance abuse with methamphetamine and alcohol who presented to the ED with progressive worsening of pain, erythema, and serosanguineous drainage from her right second toe. This began to gradually develop after her daughter trimmed her nail too close to her skin approximately 2 weeks ago. She has noticed some fever, chills, body aches, and mild headache as well as general malaise at home. She tried soaking her foot at home with no improvement in her symptoms. She denies any chest pain, shortness of breath, abdominal pain, nausea, vomiting, or dysuria. She denies any upper respiratory symptoms as well. She has not been vaccinated for COVID-19.  ED Course:Vital signs demonstrate ongoing sinus tachycardia and this is confirmed on EKG with heart rate approximately 110 bpm. Temperature 1-1.5 Fahrenheit. She is noted to have a leukocytosis of 12,900 as well as a thrombocytopenia of 129,000. BUN is 38 and creatinine is elevated at 1.1 with baseline 0.3-0.5. Chest x-ray with mild cardiomegaly and no other acute findings. Covid testing negative. Right foot x-ray with no soft tissue swelling or signs of osteomyelitis noted. Lactic acid is 1.5. Patient has been ordered and started 3 L fluid bolus as well as vancomycin and Rocephin for suspected sepsis secondary to cellulitis.  9/8:Patient remains comfortable on ventilator and is noted to be in sinus rhythm this morning. Plan to discontinue IV Cardizem drip and transition to oral as ordered. 2D echocardiogram with LVEF 55-60% with no vegetations noted. She is  being treated with Ancef for group A strep bacteremia related to cellulitis. Plan to consult cardiology today for planning of TEE. Start tube feedings as well. Appreciate further PCCM recommendations.  9/9:Patient remains comfortable on the ventilator and at 40% FiO2. Patient remains in sinus rhythm with Cardizem as ordered by cardiology. Plans for TEE once patient stable and off ventilator.  9/10:Patient is responsive this morning on the ventilator at FiO2 40%. She continues to remain on sinus rhythm with heart rate control that appears adequate. Trial of extubation today per PCCM. Continue current antibiotics.  9/11:Patient remains on ventilatorthis morning and could not extubate yesterday. Right lower extremity appears more swollen with worsening cellulitis. Discussed case with general surgery who has evaluated patient with recommendation for CT of the lower extremity which is still pending. Plan to add vancomycin on board as well. Continue monitoring.  9/12: Patient noted to have worsening hypoxemia this morning and is requiring FiO2 100%.  Fluid balance is +9 L.  Will discontinue IV fluid and try Lasix today.  Chest x-ray with coarse interstitial markings that appear unchanged.  CT of the lower extremity with diffuse soft tissue edema and findings of moderate mild fasciitis noted.  Continue vancomycin and Ancef as previously ordered.  9/13: Patient appears to have diuresed well overnight and IV fluid will remain discontinued.  FiO2 40% this morning.  She is noted to have some nausea and vomiting.  No bowel movements noted as of yet.  Plan to obtain KUB for further evaluation prior to reinitiating tube feeds.  Heart rate remains in sinus and is otherwise stable.  Assessment & Plan:   Active Problems:   Acute respiratory failure with hypoxia and hypercapnia (HCC)   Sepsis ([place])  Cellulitis of right lower leg   1-sepsis: In the setting of Group A Streptococcus  cellulitis/bacteremia. Present on admission -Patient met sepsis criteria on admission with sinus tachycardia, elevated white blood cells, febrile and with identified source of infection of cellulitic process. She had acute kidney injury as organ dysfunction. -No signs of osteomyelitis onfootchest x-ray -Blood culture positive for Group A Streptococcus -Continue on vancomycin for both MRSA coverage along with Group A strep -Transthoracic echocardiogram with LVEF 55 to 60% and no vegetations noted, appreciate cardiology consultation for TEEonce extubated -CT right lower extremity on 9/11 with findings of diffuse soft tissue edema and moderate fasciitis, no abscess or fluid collection noted. -Continue as needed antipyretics and supportive care.  2-acute on chronic respiratory failure with hypoxia and hypercapnias/p intubation -Appears to be a combination of COPD exacerbation, Underlying obesity hypoventilation syndrome and component of interstitial pulmonary edema/vascular congestion after fluid resuscitation as per sepsis protocol. -Worsening hypoxemia on 9/12 that has now improved with use of Lasix and holding of IV fluid -PCCM following with further recommendations appreciated  3-acute kidney injury-resolved -Baseline creatinine 0.6-0.7, currently around0.3 -Monitor labs closely with Lasix for diuresis -Continue tube feeds and fluid resuscitation  4-mild thrombocytopenia-resolved -Likely related to acute sepsis and underlying history of alcohol abuse -No signs of overt bleeding -Monitor carefully while on heparin drip -Repeat CBC in a.m.  5-alcohol abuse -Continue CIWA monitoring -Continue thiamine and folic acid.  6-class II obesity  -Body mass index is 36.58 kg/m. -Low calorie diet, portion control and increase physical activity discussed with patient.  7-history of hypertension-stable -Monitor closely with IV Lasix ordered -Clonidine 3 times daily per  PCCM -Hydrocortisone discontinued. -Continue oral Cardizem -Labetalol as needed  8-history of tobacco abuse -Cessation counseling has been provided. -Continue nicotine patch.  9-depression/anxiety/history of seizures -Plan is to resume psychotropic home medications once able to take by mouth. -Continue Depakote  10-type 2 diabetes mellitus -A1c 5.8 -While inpatient continue sliding scale insulin -At discharge will resume the use of Metformin in order to continue assisting with management of insulin resistant and obesity. -Modified carbohydrate diet and lifestylerecommended after discharge.  11-new onset atrial fibrillation with RVR-currently in sinus rhythm -Continue oral Cardizem as ordered -TSH 0.294, check freeT3 0.7, T4 0.75 -2D echocardiogram with LVEF 55-60% -CHA2DS2-VASc score of 3 for which heparin drip had been started, may not require anticoagulation on discharge if this appears to simply be an isolated event. -Appreciatecardiology recommendations for eventual TEE once extubated  12-Hypokalemia-resolved -Monitor  DVT prophylaxis:Heparin drip Code Status:Full Family Communication:Discussed with daughter at bedside9/10 Disposition Plan: Status is: Inpatient  Remains inpatient appropriate because:Ongoing diagnostic testing needed not appropriate for outpatient work up, IV treatments appropriate due to intensity of illness or inability to take PO and Inpatient level of care appropriate due to severity of illness   Dispo: The patient is from:Home Anticipated d/c is to:SNF Anticipated d/c date is: > 3 days Patient currently is not medically stable to d/c.Patient currently remains intubated on the ventilator and will need further evaluation per cardiology along with ongoing evaluation per PCCM.  Patient is noted to be volume overloaded and will require IV  Lasix.  Consultants:  PCCM  Cardiology  Procedures:  2D echocardiogram 9/8 with LVEF 55-60%. No vegetations.  Antimicrobials:  Anti-infectives (From admission, onward)   Start     Dose/Rate Route Frequency Ordered Stop   02/02/20 1100  vancomycin (VANCOREADY) IVPB 1500 mg/300 mL        1,500 mg 150 mL/hr over  120 Minutes Intravenous Every 24 hours 02/01/20 1225     02/01/20 1130  vancomycin (VANCOREADY) IVPB 2000 mg/400 mL        2,000 mg 200 mL/hr over 120 Minutes Intravenous  Once 02/01/20 1108 02/01/20 2010   01/27/20 1500  vancomycin (VANCOREADY) IVPB 1500 mg/300 mL  Status:  Discontinued        1,500 mg 150 mL/hr over 120 Minutes Intravenous Every 24 hours 01/26/20 1428 01/27/20 0827   01/27/20 0830  ceFAZolin (ANCEF) IVPB 2g/100 mL premix  Status:  Discontinued        2 g 200 mL/hr over 30 Minutes Intravenous Every 8 hours 01/27/20 0827 02/01/20 1222   01/26/20 1545  vancomycin (VANCOCIN) IVPB 1000 mg/200 mL premix  Status:  Discontinued        1,000 mg 200 mL/hr over 60 Minutes Intravenous  Once 01/26/20 1541 01/26/20 1553   01/26/20 1415  vancomycin (VANCOCIN) IVPB 1000 mg/200 mL premix  Status:  Discontinued        1,000 mg 200 mL/hr over 60 Minutes Intravenous  Once 01/26/20 1408 01/26/20 1413   01/26/20 1415  cefTRIAXone (ROCEPHIN) 2 g in sodium chloride 0.9 % 100 mL IVPB        2 g 200 mL/hr over 30 Minutes Intravenous  Once 01/26/20 1408 01/26/20 2011   01/26/20 1415  vancomycin (VANCOREADY) IVPB 2000 mg/400 mL        2,000 mg 200 mL/hr over 120 Minutes Intravenous  Once 01/26/20 1413 01/26/20 2011       Subjective: Patient seen and evaluated today with noted nausea and vomiting this morning.  Tube feeds have been held.  Her oxygen requirements have improved.  She is now on FiO2 40%.  She has diuresed overnight.  Objective: Vitals:   02/03/20 0500 02/03/20 0624 02/03/20 0700 02/03/20 0759  BP: (!) 103/28 138/71 (!) 142/71   Pulse: 81  84 91  Resp:  (!) '25  20 19  ' Temp:    99.6 F (37.6 C)  TempSrc:    Rectal  SpO2: 95%  97% 94%  Weight: 105.9 kg     Height:        Intake/Output Summary (Last 24 hours) at 02/03/2020 0835 Last data filed at 02/03/2020 0724 Gross per 24 hour  Intake 2566.97 ml  Output 1620 ml  Net 946.97 ml   Filed Weights   02/01/20 0200 02/02/20 0445 02/03/20 0500  Weight: 105.1 kg 106.3 kg 105.9 kg    Examination:  General exam: Appears sedated on ventilator Respiratory system: Coarse breath sounds bilaterally, intubated on FiO2 40%. Cardiovascular system: S1 & S2 heard, RRR.  Gastrointestinal system: Abdomen is nondistended, soft and nontender.  Central nervous system: Sedated Extremities: Right-sided edema and erythema noted.  Dressings are clean dry intact. Skin: Skin findings as prior with blistering now noted to right lower extremity and serosanguineous drainage. Psychiatry: Cannot be assessed.    Data Reviewed: I have personally reviewed following labs and imaging studies  CBC: Recent Labs  Lab 01/29/20 0908 01/30/20 0505 01/31/20 0607 02/01/20 0613 02/02/20 0710  WBC 12.6* 14.2* 11.0* 10.0 10.8*  HGB 13.4 12.7 11.8* 11.7* 11.5*  HCT 44.6 42.3 40.0 40.0 39.3  MCV 94.1 94.4 95.5 95.9 97.3  PLT 103* 113* 136* 153 242   Basic Metabolic Panel: Recent Labs  Lab 01/28/20 0404 01/29/20 6834 01/29/20 0908 01/29/20 1632 01/30/20 0505 01/30/20 1512 01/31/20 0607 02/01/20 0613 02/02/20 0710  NA 142  --  143  --  140  --  142 142 142  K 3.7  --  3.0*  --  3.5  --  3.4* 4.2 4.3  CL 99  --  96*  --  97*  --  98 97* 96*  CO2 30  --  34*  --  34*  --  35* 35* 38*  GLUCOSE 171*  --  152*  --  204*  --  192* 156* 155*  BUN 36*  --  63*  --  72*  --  51* 32* 28*  CREATININE 0.61   < > 1.03*  --  0.62  --  0.51 0.38* 0.37*  CALCIUM 9.0  --  8.8*  --  8.9  --  8.9 8.6* 8.7*  MG 2.2  --  2.3 2.4 2.2 2.1  --   --   --   PHOS  --   --  2.3* 1.9* 1.9* 2.3*  --   --   --    < > = values in this  interval not displayed.   GFR: Estimated Creatinine Clearance: 87 mL/min (A) (by C-G formula based on SCr of 0.37 mg/dL (L)). Liver Function Tests: No results for input(s): AST, ALT, ALKPHOS, BILITOT, PROT, ALBUMIN in the last 168 hours. No results for input(s): LIPASE, AMYLASE in the last 168 hours. No results for input(s): AMMONIA in the last 168 hours. Coagulation Profile: No results for input(s): INR, PROTIME in the last 168 hours. Cardiac Enzymes: No results for input(s): CKTOTAL, CKMB, CKMBINDEX, TROPONINI in the last 168 hours. BNP (last 3 results) No results for input(s): PROBNP in the last 8760 hours. HbA1C: No results for input(s): HGBA1C in the last 72 hours. CBG: Recent Labs  Lab 02/02/20 1610 02/02/20 2023 02/03/20 0130 02/03/20 0400 02/03/20 0758  GLUCAP 162* 136* 183* 162* 127*   Lipid Profile: Recent Labs    02/01/20 0613 02/02/20 0710  TRIG 124 129   Thyroid Function Tests: No results for input(s): TSH, T4TOTAL, FREET4, T3FREE, THYROIDAB in the last 72 hours. Anemia Panel: No results for input(s): VITAMINB12, FOLATE, FERRITIN, TIBC, IRON, RETICCTPCT in the last 72 hours. Sepsis Labs: No results for input(s): PROCALCITON, LATICACIDVEN in the last 168 hours.  Recent Results (from the past 240 hour(s))  SARS Coronavirus 2 by RT PCR (hospital order, performed in [facility] hospital lab) Nasopharyngeal Nasopharyngeal Swab     Status: None   Collection Time: 01/26/20  1:17 PM   Specimen: Nasopharyngeal Swab  Result Value Ref Range Status   SARS Coronavirus 2 NEGATIVE NEGATIVE Final    Comment: (NOTE) SARS-CoV-2 target nucleic acids are NOT DETECTED.  The SARS-CoV-2 RNA is generally detectable in upper and lower respiratory specimens during the acute phase of infection. The lowest concentration of SARS-CoV-2 viral copies this assay can detect is 250 copies / mL. A negative result does not preclude SARS-CoV-2 infection and should not be used as the sole  basis for treatment or other patient management decisions.  A negative result may occur with improper specimen collection / handling, submission of specimen other than nasopharyngeal swab, presence of viral mutation(s) within the areas targeted by this assay, and inadequate number of viral copies (<250 copies / mL). A negative result must be combined with clinical observations, patient history, and epidemiological information.  Fact Sheet for Patients:   StrictlyIdeas.no  Fact Sheet for Healthcare Providers: BankingDealers.co.za  This test is not yet approved or  cleared by the [place] FDA and has been authorized for detection and/or  diagnosis of SARS-CoV-2 by FDA under an Emergency Use Authorization (EUA).  This EUA will remain in effect (meaning this test can be used) for the duration of the COVID-19 declaration under Section 564(b)(1) of the Act, 21 U.S.C. section 360bbb-3(b)(1), unless the authorization is terminated or revoked sooner.  Performed at [facility], [address], [place], [place] [address]   Blood Culture (routine x 2)     Status: Abnormal   Collection Time: 01/26/20  1:50 PM   Specimen: Right Antecubital; Blood  Result Value Ref Range Status   Specimen Description   Final    RIGHT ANTECUBITAL BOTTLES DRAWN AEROBIC AND ANAEROBIC Performed at [facility], [address], [place], [place] [address]    Special Requests   Final    Blood Culture adequate volume Performed at [facility] [place], [address], [place], [place] [address]    Culture  Setup Time   Final    GRAM POSITIVE COCCI Gram Stain Report Called to,Read Back By and Verified With: KILMER,T @ 2536 ON 01/27/20 BY JUW IN BOTH AEROBIC AND ANAEROBIC BOTTLES GS DONE @ [facility] Organism ID to follow Performed at [facility], [place] [address], [place], [place] [address]    Culture GROUP A STREP (S.PYOGENES) ISOLATED (A)  Final   Report Status 01/29/2020  FINAL  Final   Organism ID, Bacteria GROUP A STREP (S.PYOGENES) ISOLATED  Final      Susceptibility   Group a strep (s.pyogenes) isolated - MIC*    PENICILLIN <=0.06 SENSITIVE Sensitive     CEFTRIAXONE <=0.12 SENSITIVE Sensitive     ERYTHROMYCIN >=8 RESISTANT Resistant     LEVOFLOXACIN 0.5 SENSITIVE Sensitive     VANCOMYCIN <=0.12 SENSITIVE Sensitive     * GROUP A STREP (S.PYOGENES) ISOLATED  Blood Culture ID Panel (Reflexed)     Status: Abnormal   Collection Time: 01/26/20  1:50 PM  Result Value Ref Range Status   Enterococcus faecalis NOT DETECTED NOT DETECTED Final   Enterococcus Faecium NOT DETECTED NOT DETECTED Final   Listeria monocytogenes NOT DETECTED NOT DETECTED Final   Staphylococcus species NOT DETECTED NOT DETECTED Final   Staphylococcus aureus (BCID) NOT DETECTED NOT DETECTED Final   Staphylococcus epidermidis NOT DETECTED NOT DETECTED Final   Staphylococcus lugdunensis NOT DETECTED NOT DETECTED Final   Streptococcus species DETECTED (A) NOT DETECTED Final    Comment: CRITICAL RESULT CALLED TO, READ BACK BY AND VERIFIED WITH: G. Coffee PharmD 8:25 01/27/20 ([email or web address])    Streptococcus agalactiae NOT DETECTED NOT DETECTED Final   Streptococcus pneumoniae NOT DETECTED NOT DETECTED Final   Streptococcus pyogenes DETECTED (A) NOT DETECTED Final    Comment: CRITICAL RESULT CALLED TO, READ BACK BY AND VERIFIED WITH: G. Coffee PharmD 8:25 01/27/20 ([email or web address])    A.calcoaceticus-baumannii NOT DETECTED NOT DETECTED Final   Bacteroides fragilis NOT DETECTED NOT DETECTED Final   Enterobacterales NOT DETECTED NOT DETECTED Final   Enterobacter cloacae complex NOT DETECTED NOT DETECTED Final   Escherichia coli NOT DETECTED NOT DETECTED Final   Klebsiella aerogenes NOT DETECTED NOT DETECTED Final   Klebsiella oxytoca NOT DETECTED NOT DETECTED Final   Klebsiella pneumoniae NOT DETECTED NOT DETECTED Final   Proteus species NOT DETECTED NOT DETECTED Final   Salmonella species NOT DETECTED  NOT DETECTED Final   Serratia marcescens NOT DETECTED NOT DETECTED Final   Haemophilus influenzae NOT DETECTED NOT DETECTED Final   Neisseria meningitidis NOT DETECTED NOT DETECTED Final   Pseudomonas aeruginosa NOT DETECTED NOT DETECTED Final  Stenotrophomonas maltophilia NOT DETECTED NOT DETECTED Final   Candida albicans NOT DETECTED NOT DETECTED Final   Candida auris NOT DETECTED NOT DETECTED Final   Candida glabrata NOT DETECTED NOT DETECTED Final   Candida krusei NOT DETECTED NOT DETECTED Final   Candida parapsilosis NOT DETECTED NOT DETECTED Final   Candida tropicalis NOT DETECTED NOT DETECTED Final   Cryptococcus neoformans/gattii NOT DETECTED NOT DETECTED Final    Comment: Performed at [facility], [place] [address], [place], [place] [address]  Blood Culture (routine x 2)     Status: Abnormal   Collection Time: 01/26/20  2:00 PM   Specimen: Left Antecubital; Blood  Result Value Ref Range Status   Specimen Description   Final    LEFT ANTECUBITAL BOTTLES DRAWN AEROBIC AND ANAEROBIC Performed at [facility], [address] [place], [place] [address]    Special Requests   Final    Blood Culture adequate volume Performed at [facility], [address], [place], [place] [address]    Culture  Setup Time   Final    GRAM POSITIVE COCCI Gram Stain Report Called to,Read Back By and Verified With: KILMER,T @ 3244 ON 01/27/20 BY JUW AEROBIC BOTTLE ONLY GS DONE @ [facility] Performed at [facility], [address] [place], [place] [address]    Culture (A)  Final    GROUP A STREP (S.PYOGENES) ISOLATED SUSCEPTIBILITIES PERFORMED ON PREVIOUS CULTURE WITHIN THE LAST 5 DAYS. Performed at [facility], [place] [address], [place], [place] [address]    Report Status 01/29/2020 FINAL  Final  Culture, blood (Routine X 2) w Reflex to ID Panel     Status: None (Preliminary result)   Collection Time: 01/28/20  1:01 PM   Specimen: BLOOD RIGHT HAND  Result Value Ref Range Status   Specimen  Description BLOOD RIGHT HAND  Final   Special Requests   Final    BOTTLES DRAWN AEROBIC ONLY Blood Culture results may not be optimal due to an inadequate volume of blood received in culture bottles   Culture   Final    NO GROWTH 4 DAYS Performed at [facility], [address] [place], [place] [address]    Report Status PENDING  Incomplete  Culture, blood (Routine X 2) w Reflex to ID Panel     Status: None (Preliminary result)   Collection Time: 01/28/20  1:01 PM   Specimen: BLOOD RIGHT WRIST  Result Value Ref Range Status   Specimen Description BLOOD RIGHT WRIST  Final   Special Requests   Final    BOTTLES DRAWN AEROBIC AND ANAEROBIC Blood Culture adequate volume   Culture   Final    NO GROWTH 4 DAYS Performed at [facility], [address] [place], [place] [address]    Report Status PENDING  Incomplete  MRSA PCR Screening     Status: None   Collection Time: 01/29/20 11:17 AM   Specimen: Nasal Mucosa; Nasopharyngeal  Result Value Ref Range Status   MRSA by PCR NEGATIVE NEGATIVE Final    Comment:        The GeneXpert MRSA Assay (FDA approved for NASAL specimens only), is one component of a comprehensive MRSA colonization surveillance program. It is not intended to diagnose MRSA infection nor to guide or monitor treatment for MRSA infections. Performed at [facility], [address] [place],  [address]   Urine Culture     Status: None   Collection Time: 02/01/20  8:40 PM   Specimen:  Urine, Clean Catch  Result Value Ref Range Status   Specimen Description   Final    URINE, CLEAN CATCH Performed at [facility], [address], [place], [place] [address]    Special Requests   Final    NONE Performed at [facility], [address] [place], [place] [address]    Culture   Final    NO GROWTH Performed at [facility], [place] [address], [place], [place] [address]    Report Status 02/03/2020 FINAL  Final         Radiology Studies: CT TIBIA FIBULA  RIGHT W CONTRAST  Result Date: 02/01/2020 CLINICAL DATA:  Right lower extremity pain, swelling and redness. EXAM: CT OF THE LOWER RIGHT EXTREMITY WITH CONTRAST TECHNIQUE: Multidetector CT imaging of the lower right extremity was performed according to the standard protocol following intravenous contrast administration. COMPARISON:  Radiographs 01/26/2020 CONTRAST:  59m OMNIPAQUE IOHEXOL 300 MG/ML  SOLN FINDINGS: The knee and ankle joints are maintained. There are moderate degenerative changes most notably involving the medial compartment of the knee joint. No fracture or osteochondral lesion is identified. The tibia and fibula are intact. No fracture or destructive bony changes. Small knee joint effusion is noted. No erosive findings. No findings suspicious for septic arthritis. Diffuse subcutaneous soft tissue swelling/edema/fluid and skin thickening consistent with cellulitis. I do not see a discrete drainable fluid collection to suggest an abscess. No gas is seen in the soft tissues. There is also moderate myofasciitis involving the mid to lower calf musculature with fluid in the fascial planes. I do not see any obvious changes of pyomyositis. Suggesting IMPRESSION: 1. Diffuse subcutaneous soft tissue swelling/edema/fluid and skin thickening consistent with cellulitis. No discrete drainable fluid collection to suggest an abscess. 2. Moderate myofasciitis involving the mid to lower calf musculature with fluid in the fascial planes. No obvious changes of pyomyositis. 3. No CT findings suspicious for septic arthritis or osteomyelitis. 4. Small knee joint effusion. Electronically Signed   By: PMarijo SanesM.D.   On: 02/01/2020 10:56   DG CHEST PORT 1 VIEW  Result Date: 02/02/2020 CLINICAL DATA:  Intubated EXAM: PORTABLE CHEST 1 VIEW COMPARISON:  01/31/2020 FINDINGS: Endotracheal tube and NG tube unchanged. Stable cardiac silhouette. Coarse interstitial lung markings are unchanged. No pneumothorax. No focal  consolidation. IMPRESSION: 1. Stable support apparatus. 2. Coarse interstitial lung markings. Electronically Signed   By: SSuzy BouchardM.D.   On: 02/02/2020 08:50        Scheduled Meds: . acetylcysteine  2 mL Nebulization Q6H  . arformoterol  15 mcg Nebulization BID  . benztropine  0.5 mg Per Tube BID  . budesonide (PULMICORT) nebulizer solution  0.5 mg Nebulization BID  . chlorhexidine gluconate (MEDLINE KIT)  15 mL Mouth Rinse BID  . Chlorhexidine Gluconate Cloth  6 each Topical Daily  . cloNIDine  0.2 mg Per Tube TID  . diltiazem  60 mg Per Tube Q6H  . docusate  100 mg Per Tube BID  . feeding supplement (VITAL HIGH PROTEIN)  1,000 mL Per Tube Q24H  . folic acid  1 mg Per Tube Daily  . furosemide  40 mg Intravenous Q12H  . insulin aspart  0-9 Units Subcutaneous Q4H  . levalbuterol  0.63 mg Nebulization Q6H  . mouth rinse  15 mL Mouth Rinse 10 times per day  . nicotine  7 mg Transdermal Daily  . pantoprazole (PROTONIX) IV  40 mg Intravenous Q24H  . polyethylene glycol  17 g  Oral Daily  . thiamine  100 mg Oral Daily   Or  . thiamine  100 mg Intravenous Daily  . valproic acid  500 mg Per Tube BID   Continuous Infusions: . sodium chloride 10 mL/hr at 01/29/20 1401  . heparin 2,250 Units/hr (02/03/20 0724)  . midazolam 4 mg/hr (02/03/20 0724)  . vancomycin 1,500 mg (02/02/20 1113)     LOS: 8 days    Time spent: 40 minutes    Juanette Urizar D Manuella Ghazi, DO [facility]  If 7PM-7AM, please contact night-coverage [email or web address] 02/03/2020, 8:35 AM

## 2020-02-03 NOTE — Progress Notes (Addendum)
ANTICOAGULATION CONSULT NOTE - [place] for Heparin Indication: atrial fibrillation  Allergies  Allergen Reactions  . Bactrim [Sulfamethoxazole-Trimethoprim] Anaphylaxis  . Amoxicillin Hives  . Diclofenac Tinitus    swelling in feet  . Morphine And Related Nausea And Vomiting and Other (See Comments)    headache  . Potassium-Containing Compounds Itching    Patient Measurements: Height: 5\' 5"  (165.1 cm) Weight: 105.9 kg (233 lb 7.5 oz) IBW/kg (Calculated) : 57 Heparin Dosing Weight:  80 kg  Vital Signs: Temp: 99.6 F (37.6 C) (09/13 0759) Temp Source: Rectal (09/13 0759) BP: 142/71 (09/13 0700) Pulse Rate: 91 (09/13 0759)  Labs: Recent Labs    02/01/20 0613 02/01/20 0613 02/02/20 0710 02/03/20 0611  HGB 11.7*   < > 11.5* 10.8*  HCT 40.0  --  39.3 37.6  PLT 153  --  178 181  HEPARINUNFRC 0.39  --  <0.10* 0.32  CREATININE 0.38*  --  0.37*  --    < > = values in this interval not displayed.    Estimated Creatinine Clearance: 87 mL/min (A) (by C-G formula based on SCr of 0.37 mg/dL (L)).  Assessment: Pharmacy was consulted to dose heparin in 64 yr old female with atrial fibrillation.  Patient was not on anticoagulation prior to admission.   02-03-20 update Heparin level: <0.32 IU/mL,within goal range on heparin at 2250 units/hr  CBC: Hb: 11.8>11.7> 11.5 >10.8 (stable)  Plates 190>206>161>181 RN reports a little bleeding from an IV site that   has  the BP cuff on it.   Goal of Therapy:  Heparin level 0.3-0.7 units/ml Monitor platelets by anticoagulation protocol: Yes   Plan:  Continue heparin infusion rate at 2250 units/hr  Monitor daily heparin level, CBC Monitor for signs/symptoms of bleeding   Despina Pole, Pharm. D. Clinical Pharmacist 02/03/2020 8:57 AM

## 2020-02-04 ENCOUNTER — Inpatient Hospital Stay (HOSPITAL_COMMUNITY): Payer: Medicaid Other

## 2020-02-04 DIAGNOSIS — J9809 Other diseases of bronchus, not elsewhere classified: Secondary | ICD-10-CM

## 2020-02-04 LAB — BASIC METABOLIC PANEL
Anion gap: 12 (ref 5–15)
BUN: 27 mg/dL — ABNORMAL HIGH (ref 8–23)
CO2: 38 mmol/L — ABNORMAL HIGH (ref 22–32)
Calcium: 9 mg/dL (ref 8.9–10.3)
Chloride: 90 mmol/L — ABNORMAL LOW (ref 98–111)
Creatinine, Ser: 0.42 mg/dL — ABNORMAL LOW (ref 0.44–1.00)
GFR calc Af Amer: >60 mL/min (ref >60)
GFR calc non Af Amer: >60 mL/min (ref >60)
Glucose, Bld: 114 mg/dL — ABNORMAL HIGH (ref 70–99)
Potassium: 4.2 mmol/L (ref 3.5–5.1)
Sodium: 140 mmol/L (ref 135–145)

## 2020-02-04 LAB — GLUCOSE, CAPILLARY
Glucose-Capillary: 101 mg/dL — ABNORMAL HIGH (ref 70–99)
Glucose-Capillary: 105 mg/dL — ABNORMAL HIGH (ref 70–99)
Glucose-Capillary: 107 mg/dL — ABNORMAL HIGH (ref 70–99)
Glucose-Capillary: 119 mg/dL — ABNORMAL HIGH (ref 70–99)
Glucose-Capillary: 120 mg/dL — ABNORMAL HIGH (ref 70–99)
Glucose-Capillary: 144 mg/dL — ABNORMAL HIGH (ref 70–99)
Glucose-Capillary: 99 mg/dL (ref 70–99)

## 2020-02-04 LAB — CBC
HCT: 42.6 % (ref 36.0–46.0)
Hemoglobin: 12.2 g/dL (ref 12.0–15.0)
MCH: 28.4 pg (ref 26.0–34.0)
MCHC: 28.6 g/dL — ABNORMAL LOW (ref 30.0–36.0)
MCV: 99.3 fL (ref 80.0–100.0)
Platelets: 187 10*3/uL (ref 150–400)
RBC: 4.29 MIL/uL (ref 3.87–5.11)
RDW: 15.5 % (ref 11.5–15.5)
WBC: 9.1 10*3/uL (ref 4.0–10.5)
nRBC: 0 % (ref 0.0–0.2)

## 2020-02-04 LAB — MAGNESIUM: Magnesium: 2.2 mg/dL (ref 1.7–2.4)

## 2020-02-04 LAB — HEPARIN LEVEL (UNFRACTIONATED): Heparin Unfractionated: 0.46 IU/mL (ref 0.30–0.70)

## 2020-02-04 MED ORDER — ACETYLCYSTEINE 20 % IN SOLN
2.0000 mL | Freq: Two times a day (BID) | RESPIRATORY_TRACT | Status: AC
  Administered 2020-02-04 – 2020-02-06 (×4): 2 mL via RESPIRATORY_TRACT
  Filled 2020-02-04 (×3): qty 4

## 2020-02-04 MED ORDER — BISACODYL 10 MG RE SUPP
10.0000 mg | Freq: Once | RECTAL | Status: AC
  Administered 2020-02-04: 10 mg via RECTAL
  Filled 2020-02-04: qty 1

## 2020-02-04 MED ORDER — CLINDAMYCIN PHOSPHATE 600 MG/50ML IV SOLN
600.0000 mg | Freq: Three times a day (TID) | INTRAVENOUS | Status: AC
  Administered 2020-02-04 – 2020-02-09 (×14): 600 mg via INTRAVENOUS
  Filled 2020-02-04 (×14): qty 50

## 2020-02-04 MED ORDER — FENTANYL 2500MCG IN NS 250ML (10MCG/ML) PREMIX INFUSION
0.0000 ug/h | INTRAVENOUS | Status: DC
  Administered 2020-02-04: 25 ug/h via INTRAVENOUS
  Administered 2020-02-05: 150 ug/h via INTRAVENOUS
  Administered 2020-02-06: 100 ug/h via INTRAVENOUS
  Administered 2020-02-07: 150 ug/h via INTRAVENOUS
  Filled 2020-02-04 (×4): qty 250

## 2020-02-04 NOTE — Progress Notes (Signed)
Telemetry reviewed, NSR and no recurrent afib by review.     She is on dilt oral 60mg  every 6 hours, hep gtt. Isolated episode in the setting of systemic illness, if no significant recurrence prior to discharge would not start oral anticoag and plan for outpatient monitor. Continue short acting dilt, can consolidate to long acting closer to discharge and outside of ICU setting.     TEE once more stable from a medical standpoint, she does not have an urgent indication. Remains intubated.   Carlyle Dolly MD

## 2020-02-04 NOTE — Progress Notes (Signed)
PROGRESS NOTE    [name] [name]  WLN:[id number] DOB: 03-09-1956 DOA: 01/26/2020 PCP: Leonard Downing, MD   Brief Narrative:  Per HPI: [name] [name] a 64 y.o.femalewith medical history significant forCOPD on 2L [place] at bedtime, hypertension, type 2 diabetes, obesity, bipolar disorder/anxiety disorder, ongoing tobacco use,questionable ongoing alcohol abuse,and prior polysubstance abuse with methamphetamine and alcohol who presented to the ED with progressive worsening of pain, erythema, and serosanguineous drainage from her right second toe. This began to gradually develop after her daughter trimmed her nail too close to her skin approximately 2 weeks ago. She has noticed some fever, chills, body aches, and mild headache as well as general malaise at home. She tried soaking her foot at home with no improvement in her symptoms. She denies any chest pain, shortness of breath, abdominal pain, nausea, vomiting, or dysuria. She denies any upper respiratory symptoms as well. She has not been vaccinated for COVID-19.  ED Course:Vital signs demonstrate ongoing sinus tachycardia and this is confirmed on EKG with heart rate approximately 110 bpm. Temperature 1-1.5 Fahrenheit. She is noted to have a leukocytosis of 12,900 as well as a thrombocytopenia of 129,000. BUN is 38 and creatinine is elevated at 1.1 with baseline 0.3-0.5. Chest x-ray with mild cardiomegaly and no other acute findings. Covid testing negative. Right foot x-ray with no soft tissue swelling or signs of osteomyelitis noted. Lactic acid is 1.5. Patient has been ordered and started 3 L fluid bolus as well as vancomycin and Rocephin for suspected sepsis secondary to cellulitis.  9/14: Patient remains sedated on ventilator this morning with PCCM following.  She is started to have some signs of ileus.  Tube feedings have been discontinued.  Plan to restart today and see if this is tolerated.  Try bisacodyl suppository  as well.  Continue IV antibiotics with vancomycin to treat group B strep bacteremia as well as what appears to be worsening cellulitis and mild fasciitis to right lower extremity.  TEE suspended until patient's clinical condition further improved.  Assessment & Plan:   Active Problems:   Acute respiratory failure with hypoxia and hypercapnia (HCC)   Sepsis (HCC)   Cellulitis of right lower leg   1-sepsis: In the setting of Group A Streptococcus cellulitis/bacteremia. Present on admission -Patient met sepsis criteria on admission with sinus tachycardia, elevated white blood cells, febrile and with identified source of infection of cellulitic process. She had acute kidney injury as organ dysfunction. -No signs of osteomyelitis onfootchest x-ray -Blood culture positive for Group A Streptococcus -Continue on vancomycin for both MRSA coverage along with Group A strep -Transthoracic echocardiogram with LVEF 55 to 60% and no vegetations noted, appreciate cardiology consultation for TEEonce extubated -CT right lower extremity on 9/11 with findings of diffuse soft tissue edema and moderate fasciitis, no abscess or fluid collection noted. -Continue as needed antipyretics and supportive care.  2-acute on chronic respiratory failure with hypoxia and hypercapnias/p intubation -Appears to be a combination of COPD exacerbation, Underlying obesity hypoventilation syndrome and component of interstitial pulmonary edema/vascular congestion after fluid resuscitation as per sepsis protocol. -Worsening hypoxemia on 9/12 that has now improved after diuresis, holding further Lasix for now due to poor intake -PCCM following with further recommendations appreciated  3-acute kidney injury-resolved -Baseline creatinine 0.6-0.7, currently around0.4 -Monitor labs closely with Lasix for diuresis prn -Continue to monitor in a.m. -Plan to start IV fluid if patient cannot tolerate tube feeds.  4-mild  thrombocytopenia-resolved -Likely related to acute sepsis and underlying history of alcohol abuse -  No signs of overt bleeding -Monitor carefully while on heparin drip -Repeat CBC in a.m.  5-alcohol abuse -Continue CIWA monitoring -Continue thiamine and folic acid.  6-class II obesity  -Body mass index is 36.58 kg/m. -Low calorie diet, portion control and increase physical activity discussed with patient.  7-history of hypertension-stable -Monitor closely with IV Lasix ordered -Clonidine 3 times daily per PCCM -Hydrocortisone discontinued. -Continue oral Cardizem -Labetalol as needed  8-history of tobacco abuse -Cessation counseling has been provided. -Continue nicotine patch.  9-depression/anxiety/history of seizures -Plan is to resume psychotropic home medications once able to take by mouth. -Continue Depakote  10-type 2 diabetes mellitus -A1c 5.8 -While inpatient continue sliding scale insulin -At discharge will resume the use of Metformin in order to continue assisting with management of insulin resistant and obesity. -Modified carbohydrate diet and lifestylerecommended after discharge.  11-new onset atrial fibrillation with RVR-currently in sinus rhythm -Continue oral Cardizem as ordered -TSH 0.294, check freeT3 0.7, T4 0.75 -2D echocardiogram with LVEF 55-60% -CHA2DS2-VASc score of 3 for which heparin driphad been started, may not require anticoagulation on discharge if this appears to simply be an isolated event. -Appreciatecardiology recommendations for eventual TEE once extubated  12-Hypokalemia-resolved -Monitor  13-ileus with poor tolerance of tube feeds -Trial of bisacodyl suppository today -Maintain magnesium and potassium of 2 and 4 respectively -Try restarting tube feeds, if not tolerated will hold and place OG to low intermittent suction -If not improving, may require general surgery consultation for further evaluation of possible  SBO  DVT prophylaxis:Heparin drip Code Status:Full Family Communication:Discussed with daughter at bedside9/14 Disposition Plan: Status is: Inpatient  Remains inpatient appropriate because:Ongoing diagnostic testing needed not appropriate for outpatient work up, IV treatments appropriate due to intensity of illness or inability to take PO and Inpatient level of care appropriate due to severity of illness   Dispo: The patient is from:Home Anticipated d/c is to:SNF Anticipated d/c date is: > 3 days Patient currently is not medically stable to d/c.Patient currently remains intubated on the ventilator and will need further evaluation per cardiology along with ongoing evaluation per PCCM.  Consultants:  PCCM  Cardiology  Procedures:  2D echocardiogram 9/8 with LVEF 55-60%. No vegetations.  Antimicrobials:  Anti-infectives (From admission, onward)   Start     Dose/Rate Route Frequency Ordered Stop   02/02/20 1100  vancomycin (VANCOREADY) IVPB 1500 mg/300 mL        1,500 mg 150 mL/hr over 120 Minutes Intravenous Every 24 hours 02/01/20 1225     02/01/20 1130  vancomycin (VANCOREADY) IVPB 2000 mg/400 mL        2,000 mg 200 mL/hr over 120 Minutes Intravenous  Once 02/01/20 1108 02/01/20 2010   01/27/20 1500  vancomycin (VANCOREADY) IVPB 1500 mg/300 mL  Status:  Discontinued        1,500 mg 150 mL/hr over 120 Minutes Intravenous Every 24 hours 01/26/20 1428 01/27/20 0827   01/27/20 0830  ceFAZolin (ANCEF) IVPB 2g/100 mL premix  Status:  Discontinued        2 g 200 mL/hr over 30 Minutes Intravenous Every 8 hours 01/27/20 0827 02/01/20 1222   01/26/20 1545  vancomycin (VANCOCIN) IVPB 1000 mg/200 mL premix  Status:  Discontinued        1,000 mg 200 mL/hr over 60 Minutes Intravenous  Once 01/26/20 1541 01/26/20 1553   01/26/20 1415  vancomycin (VANCOCIN) IVPB 1000 mg/200 mL premix  Status:  Discontinued        1,000 mg 200 mL/hr  over 60 Minutes Intravenous  Once 01/26/20 1408 01/26/20 1413   01/26/20 1415  cefTRIAXone (ROCEPHIN) 2 g in sodium chloride 0.9 % 100 mL IVPB        2 g 200 mL/hr over 30 Minutes Intravenous  Once 01/26/20 1408 01/26/20 2011   01/26/20 1415  vancomycin (VANCOREADY) IVPB 2000 mg/400 mL        2,000 mg 200 mL/hr over 120 Minutes Intravenous  Once 01/26/20 1413 01/26/20 2011      Subjective: Patient seen and evaluated today with some nausea and vomiting noted yesterday evening.  Tube feeds have been discontinued as a result.  No bowel movements noted.  No other acute events noted.  Objective: Vitals:   02/04/20 0630 02/04/20 0645 02/04/20 0855 02/04/20 0858  BP: (!) 109/59 123/65    Pulse: 81 86    Resp: 17 (!) 21    Temp:      TempSrc:      SpO2: 93% 94% 90%   Weight:      Height:    '5\' 5"'  (1.651 m)    Intake/Output Summary (Last 24 hours) at 02/04/2020 0934 Last data filed at 02/04/2020 0800 Gross per 24 hour  Intake 1098.55 ml  Output 650 ml  Net 448.55 ml   Filed Weights   02/02/20 0445 02/03/20 0500 02/04/20 0500  Weight: 106.3 kg 105.9 kg 103.6 kg    Examination:  General exam: Appears sedated on ventilator Respiratory system: Coarse bilaterally.  Intubated on FiO2 40% Cardiovascular system: S1 & S2 heard, RRR.  Gastrointestinal system: Abdomen minimally distended, bowel sounds present. Central nervous system: Sedated on ventilator Extremities: Right lower extremity erythema and edema with some serosanguineous seepage.  Dressings currently present and are clean dry and intact. Skin: As above Psychiatry: Cannot be assessed    Data Reviewed: I have personally reviewed following labs and imaging studies  CBC: Recent Labs  Lab 01/31/20 0607 02/01/20 0613 02/02/20 0710 02/03/20 0611 02/04/20 0814  WBC 11.0* 10.0 10.8* 8.9 9.1  HGB 11.8* 11.7* 11.5* 10.8* 12.2  HCT 40.0 40.0 39.3 37.6 42.6  MCV 95.5 95.9 97.3 97.2 99.3  PLT 136* 153 178 181 559   Basic  Metabolic Panel: Recent Labs  Lab 01/29/20 0908 01/29/20 0908 01/29/20 1632 01/30/20 0505 01/30/20 0505 01/30/20 1512 01/31/20 0607 02/01/20 0613 02/02/20 0710 02/03/20 0611 02/04/20 0814  NA 143   < >  --  140   < >  --  142 142 142 142 140  K 3.0*   < >  --  3.5   < >  --  3.4* 4.2 4.3 4.1 4.2  CL 96*   < >  --  97*   < >  --  98 97* 96* 92* 90*  CO2 34*   < >  --  34*   < >  --  35* 35* 38* 41* 38*  GLUCOSE 152*   < >  --  204*   < >  --  192* 156* 155* 180* 114*  BUN 63*   < >  --  72*   < >  --  51* 32* 28* 28* 27*  CREATININE 1.03*   < >  --  0.62   < >  --  0.51 0.38* 0.37* 0.36* 0.42*  CALCIUM 8.8*   < >  --  8.9   < >  --  8.9 8.6* 8.7* 9.2 9.0  MG 2.3   < > 2.4 2.2  --  2.1  --   --   --  1.7 2.2  PHOS 2.3*  --  1.9* 1.9*  --  2.3*  --   --   --   --   --    < > = values in this interval not displayed.   GFR: Estimated Creatinine Clearance: 85.9 mL/min (A) (by C-G formula based on SCr of 0.42 mg/dL (L)). Liver Function Tests: No results for input(s): AST, ALT, ALKPHOS, BILITOT, PROT, ALBUMIN in the last 168 hours. No results for input(s): LIPASE, AMYLASE in the last 168 hours. No results for input(s): AMMONIA in the last 168 hours. Coagulation Profile: No results for input(s): INR, PROTIME in the last 168 hours. Cardiac Enzymes: No results for input(s): CKTOTAL, CKMB, CKMBINDEX, TROPONINI in the last 168 hours. BNP (last 3 results) No results for input(s): PROBNP in the last 8760 hours. HbA1C: No results for input(s): HGBA1C in the last 72 hours. CBG: Recent Labs  Lab 02/03/20 1618 02/03/20 2017 02/04/20 0121 02/04/20 0454 02/04/20 0859  GLUCAP 140* 123* 107* 120* 119*   Lipid Profile: Recent Labs    02/02/20 0710 02/03/20 0611  TRIG 129 145   Thyroid Function Tests: No results for input(s): TSH, T4TOTAL, FREET4, T3FREE, THYROIDAB in the last 72 hours. Anemia Panel: No results for input(s): VITAMINB12, FOLATE, FERRITIN, TIBC, IRON, RETICCTPCT in  the last 72 hours. Sepsis Labs: No results for input(s): PROCALCITON, LATICACIDVEN in the last 168 hours.  Recent Results (from the past 240 hour(s))  SARS Coronavirus 2 by RT PCR (hospital order, performed in [facility] hospital lab) Nasopharyngeal Nasopharyngeal Swab     Status: None   Collection Time: 01/26/20  1:17 PM   Specimen: Nasopharyngeal Swab  Result Value Ref Range Status   SARS Coronavirus 2 NEGATIVE NEGATIVE Final    Comment: (NOTE) SARS-CoV-2 target nucleic acids are NOT DETECTED.  The SARS-CoV-2 RNA is generally detectable in upper and lower respiratory specimens during the acute phase of infection. The lowest concentration of SARS-CoV-2 viral copies this assay can detect is 250 copies / mL. A negative result does not preclude SARS-CoV-2 infection and should not be used as the sole basis for treatment or other patient management decisions.  A negative result may occur with improper specimen collection / handling, submission of specimen other than nasopharyngeal swab, presence of viral mutation(s) within the areas targeted by this assay, and inadequate number of viral copies (<250 copies / mL). A negative result must be combined with clinical observations, patient history, and epidemiological information.  Fact Sheet for Patients:   StrictlyIdeas.no  Fact Sheet for Healthcare Providers: BankingDealers.co.za  This test is not yet approved or  cleared by the [place] FDA and has been authorized for detection and/or diagnosis of SARS-CoV-2 by FDA under an Emergency Use Authorization (EUA).  This EUA will remain in effect (meaning this test can be used) for the duration of the COVID-19 declaration under Section 564(b)(1) of the Act, 21 U.S.C. section 360bbb-3(b)(1), unless the authorization is terminated or revoked sooner.  Performed at [facility], [address], [place], [place] [address]   Blood Culture  (routine x 2)     Status: Abnormal   Collection Time: 01/26/20  1:50 PM   Specimen: Right Antecubital; Blood  Result Value Ref Range Status   Specimen Description   Final    RIGHT ANTECUBITAL BOTTLES DRAWN AEROBIC AND ANAEROBIC Performed at [facility], [address] [place], [place] [address]    Special Requests  Final    Blood Culture adequate volume Performed at [facility], [address] [place], [place] [address]    Culture  Setup Time   Final    GRAM POSITIVE COCCI Gram Stain Report Called to,Read Back By and Verified With: KILMER,T @ 0300 ON 01/27/20 BY JUW IN BOTH AEROBIC AND ANAEROBIC BOTTLES GS DONE @ [facility] Organism ID to follow Performed at [facility], [place] [address], [place], [place] [address]    Culture GROUP A STREP (S.PYOGENES) ISOLATED (A)  Final   Report Status 01/29/2020 FINAL  Final   Organism ID, Bacteria GROUP A STREP (S.PYOGENES) ISOLATED  Final      Susceptibility   Group a strep (s.pyogenes) isolated - MIC*    PENICILLIN <=0.06 SENSITIVE Sensitive     CEFTRIAXONE <=0.12 SENSITIVE Sensitive     ERYTHROMYCIN >=8 RESISTANT Resistant     LEVOFLOXACIN 0.5 SENSITIVE Sensitive     VANCOMYCIN <=0.12 SENSITIVE Sensitive     * GROUP A STREP (S.PYOGENES) ISOLATED  Blood Culture ID Panel (Reflexed)     Status: Abnormal   Collection Time: 01/26/20  1:50 PM  Result Value Ref Range Status   Enterococcus faecalis NOT DETECTED NOT DETECTED Final   Enterococcus Faecium NOT DETECTED NOT DETECTED Final   Listeria monocytogenes NOT DETECTED NOT DETECTED Final   Staphylococcus species NOT DETECTED NOT DETECTED Final   Staphylococcus aureus (BCID) NOT DETECTED NOT DETECTED Final   Staphylococcus epidermidis NOT DETECTED NOT DETECTED Final   Staphylococcus lugdunensis NOT DETECTED NOT DETECTED Final   Streptococcus species DETECTED (A) NOT DETECTED Final    Comment: CRITICAL RESULT CALLED TO, READ BACK BY AND VERIFIED WITH: G. Coffee PharmD 8:25 01/27/20 ([email or web address])      Streptococcus agalactiae NOT DETECTED NOT DETECTED Final   Streptococcus pneumoniae NOT DETECTED NOT DETECTED Final   Streptococcus pyogenes DETECTED (A) NOT DETECTED Final    Comment: CRITICAL RESULT CALLED TO, READ BACK BY AND VERIFIED WITH: G. Coffee PharmD 8:25 01/27/20 ([email or web address])    A.calcoaceticus-baumannii NOT DETECTED NOT DETECTED Final   Bacteroides fragilis NOT DETECTED NOT DETECTED Final   Enterobacterales NOT DETECTED NOT DETECTED Final   Enterobacter cloacae complex NOT DETECTED NOT DETECTED Final   Escherichia coli NOT DETECTED NOT DETECTED Final   Klebsiella aerogenes NOT DETECTED NOT DETECTED Final   Klebsiella oxytoca NOT DETECTED NOT DETECTED Final   Klebsiella pneumoniae NOT DETECTED NOT DETECTED Final   Proteus species NOT DETECTED NOT DETECTED Final   Salmonella species NOT DETECTED NOT DETECTED Final   Serratia marcescens NOT DETECTED NOT DETECTED Final   Haemophilus influenzae NOT DETECTED NOT DETECTED Final   Neisseria meningitidis NOT DETECTED NOT DETECTED Final   Pseudomonas aeruginosa NOT DETECTED NOT DETECTED Final   Stenotrophomonas maltophilia NOT DETECTED NOT DETECTED Final   Candida albicans NOT DETECTED NOT DETECTED Final   Candida auris NOT DETECTED NOT DETECTED Final   Candida glabrata NOT DETECTED NOT DETECTED Final   Candida krusei NOT DETECTED NOT DETECTED Final   Candida parapsilosis NOT DETECTED NOT DETECTED Final   Candida tropicalis NOT DETECTED NOT DETECTED Final   Cryptococcus neoformans/gattii NOT DETECTED NOT DETECTED Final    Comment: Performed at [facility], [place] [address], [place], [place] [address]  Blood Culture (routine x 2)     Status: Abnormal   Collection Time: 01/26/20  2:00 PM   Specimen: Left Antecubital; Blood  Result Value Ref Range Status   Specimen Description   Final  LEFT ANTECUBITAL BOTTLES DRAWN AEROBIC AND ANAEROBIC Performed at [facility], [address], [place], [place] [address]    Special Requests    Final    Blood Culture adequate volume Performed at [facility], [address], [place], [place] [address]    Culture  Setup Time   Final    GRAM POSITIVE COCCI Gram Stain Report Called to,Read Back By and Verified With: KILMER,T @ 0981 ON 01/27/20 BY JUW AEROBIC BOTTLE ONLY GS DONE @ [facility] Performed at [facility], [address], [place], [place] [address]    Culture (A)  Final    GROUP A STREP (S.PYOGENES) ISOLATED SUSCEPTIBILITIES PERFORMED ON PREVIOUS CULTURE WITHIN THE LAST 5 DAYS. Performed at [facility], [place] [address], [place], [place] [address]    Report Status 01/29/2020 FINAL  Final  Culture, blood (Routine X 2) w Reflex to ID Panel     Status: None   Collection Time: 01/28/20  1:01 PM   Specimen: BLOOD RIGHT HAND  Result Value Ref Range Status   Specimen Description BLOOD RIGHT HAND  Final   Special Requests   Final    BOTTLES DRAWN AEROBIC ONLY Blood Culture results may not be optimal due to an inadequate volume of blood received in culture bottles   Culture   Final    NO GROWTH 6 DAYS Performed at [facility], [address] [place], [place] [address]    Report Status 02/03/2020 FINAL  Final  Culture, blood (Routine X 2) w Reflex to ID Panel     Status: None   Collection Time: 01/28/20  1:01 PM   Specimen: BLOOD RIGHT WRIST  Result Value Ref Range Status   Specimen Description BLOOD RIGHT WRIST  Final   Special Requests   Final    BOTTLES DRAWN AEROBIC AND ANAEROBIC Blood Culture adequate volume   Culture   Final    NO GROWTH 6 DAYS Performed at [facility], [address] [place], [place] [address]    Report Status 02/03/2020 FINAL  Final  MRSA PCR Screening     Status: None   Collection Time: 01/29/20 11:17 AM   Specimen: Nasal Mucosa; Nasopharyngeal  Result Value Ref Range Status   MRSA by PCR NEGATIVE NEGATIVE Final    Comment:        The GeneXpert MRSA Assay (FDA approved for NASAL specimens only), is one component of a comprehensive MRSA  colonization surveillance program. It is not intended to diagnose MRSA infection nor to guide or monitor treatment for MRSA infections. Performed at [facility], [address], [place], [place] [address]   Urine Culture     Status: None   Collection Time: 02/01/20  8:40 PM   Specimen: Urine, Clean Catch  Result Value Ref Range Status   Specimen Description   Final    URINE, CLEAN CATCH Performed at [facility], [address], [place], [place] [address]    Special Requests   Final    NONE Performed at [facility], [address], [place], [place] [address]    Culture   Final    NO GROWTH Performed at [facility], [place] [address], [place], [place] [address]    Report Status 02/03/2020 FINAL  Final         Radiology Studies: DG Abd 1 View  Result Date: 02/03/2020 CLINICAL DATA:  Intractable nausea and vomiting. EXAM: ABDOMEN - 1 VIEW COMPARISON:  None. FINDINGS: The NG tube tip is in  the fundal region of the stomach. Scattered air throughout the small bowel and colon without distension may suggest a mild ileus or gastroenteritis. No findings for obstruction or perforation. IMPRESSION: 1. NG tube in good position. 2. Possible mild ileus or gastroenteritis. Electronically Signed   By: Marijo Sanes M.D.   On: 02/03/2020 11:43   DG Chest Port 1 View  Result Date: 02/04/2020 CLINICAL DATA:  Intubation EXAM: PORTABLE CHEST 1 VIEW COMPARISON:  Two days ago FINDINGS: Endotracheal tube with tip at the clavicular heads. The enteric tube reaches the stomach. Worsening aeration at the right base. Cardiomegaly. Remote left rib fracture. IMPRESSION: Worsening aeration at the right base which could be pneumonia or atelectasis. Electronically Signed   By: Monte Fantasia M.D.   On: 02/04/2020 07:27        Scheduled Meds: . arformoterol  15 mcg Nebulization BID  . benztropine  0.5 mg Per Tube BID  . budesonide (PULMICORT) nebulizer solution  0.5 mg Nebulization BID  .  chlorhexidine gluconate (MEDLINE KIT)  15 mL Mouth Rinse BID  . Chlorhexidine Gluconate Cloth  6 each Topical Daily  . cloNIDine  0.2 mg Per Tube TID  . diltiazem  60 mg Per Tube Q6H  . docusate  100 mg Per Tube BID  . feeding supplement (VITAL HIGH PROTEIN)  1,000 mL Per Tube Q24H  . folic acid  1 mg Per Tube Daily  . insulin aspart  0-9 Units Subcutaneous Q4H  . mouth rinse  15 mL Mouth Rinse 10 times per day  . nicotine  7 mg Transdermal Daily  . pantoprazole sodium  40 mg Per Tube Q24H  . polyethylene glycol  17 g Oral Daily  . thiamine  100 mg Oral Daily   Or  . thiamine  100 mg Intravenous Daily  . valproic acid  500 mg Per Tube BID   Continuous Infusions: . sodium chloride 10 mL/hr at 01/29/20 1401  . heparin 2,250 Units/hr (02/04/20 0800)  . midazolam 10 mg/hr (02/04/20 0800)  . vancomycin Stopped (02/03/20 1327)     LOS: 9 days    Time spent: 35 minutes    Demarious Kapur D Manuella Ghazi, DO [facility]  If 7PM-7AM, please contact night-coverage [email or web address] 02/04/2020, 9:34 AM

## 2020-02-04 NOTE — Progress Notes (Signed)
NAME:  [name] [name], MRN:  [id number], DOB:  February 23, 1956, LOS: 9 ADMISSION DATE:  01/26/2020, CONSULTATION DATE:  01/28/20 REFERRING MD:  Ortiz/ Triad, CHIEF COMPLAINT:  resp distress    Brief History   64 yo female smoker presented 9/05 with Rt foot pain and swelling.  This was associated with chills, myalgias from sepsis with cellulitis.  Developed A fib with RV and then hypoxia.  Required intubation 9/07 and PCCM consulted.  Past Medical History  COPD on home oxygen, HTN, DM, Bipolar  Significant Hospital Events   9/05 Admit 9/07 intubated 9/08 back in sinus rhythm  Consults:  Cardiology  Procedures:  ETT 9/07 >>   Significant Diagnostic Tests:   Echo 9/08 >> EF 55 to 60%  CT Rt leg 9/11 >> diffuse subcutaneous soft tissue swelling, moderate myofasciitis involving mid to lower calf, no osteomyelitis or septic arthritis  Micro Data:  COVID 9/05 >> negative MRSA PCR 9/08 >> negative Blood 9/05 >> Group B Streptococcus Blood 9/07 >>   Antimicrobials:  Vancomycin 9/05 Rocephin 9/05 Ancef 9/06 >> 9/10 Vancomycin 9/11 >>   Interim history/subjective:  Agitated with coughing spells during WUA.  Low Vt with SBT.  Objective   Blood pressure 123/65, pulse 80, temperature 98.6 F (37 C), temperature source Oral, resp. rate 19, height 5\' 5"  (1.651 m), weight 103.6 kg, SpO2 92 %.    Vent Mode: PRVC FiO2 (%):  [35 %-50 %] 50 % Set Rate:  [16 bmp] 16 bmp Vt Set:  [450 mL] 450 mL PEEP:  [5 cmH20] 5 cmH20 Plateau Pressure:  [19 cmH20-44 cmH20] 22 cmH20   Intake/Output Summary (Last 24 hours) at 02/04/2020 1424 Last data filed at 02/04/2020 1100 Gross per 24 hour  Intake 684.74 ml  Output 650 ml  Net 34.74 ml   Filed Weights   02/02/20 0445 02/03/20 0500 02/04/20 0500  Weight: 106.3 kg 105.9 kg 103.6 kg    Examination:  General - sedated Eyes - pupils reactive ENT - ETT in place Cardiac - regular rate/rhythm, no murmur Chest - faint b/l wheezing Abdomen -  soft, non tender, + bowel sounds Extremities - Rt lower leg in a wrap Skin - no rashes Neuro - RASS -1  Resolved Hospital Problem list      Assessment & Plan:   Acute on chronic hypoxic/hypercapnic respiratory failure from acute pulmonary edema in setting of A fib with RVR, COPD exacerbation, and mucus plugging. - continue brovana, pulmicort, prn xopenex - f/u CXR - bronchial hygiene; restart mucomyst - might need bronchoscopy if aeration in Rt lower lung doesn't improve - not ready for vent weaning at this time  Group B Streptococcal bacteremia 2nd to Rt lower leg cellulitis. - Abx per primary team  Transient A fib with RVR. - back in sinus rhythm  Acute metabolic encephalopathy from sepsis, hypoxia. Hx of ETOH, depression, anxiety, seizures. - versed gtt with prn fentanyl for RASS goa 0 to -1  Best practice:  Diet: tube feeds DVT prophylaxis: heparin gtt GI prophylaxis: protonix Mobility: bed rest Code Status: full  Disposition: ICU  Labs    CMP Latest Ref Rng & Units 02/04/2020 02/03/2020 02/02/2020  Glucose 70 - 99 mg/dL 114(H) 180(H) 155(H)  BUN 8 - 23 mg/dL 27(H) 28(H) 28(H)  Creatinine 0.44 - 1.00 mg/dL 0.42(L) 0.36(L) 0.37(L)  Sodium 135 - 145 mmol/L 140 142 142  Potassium 3.5 - 5.1 mmol/L 4.2 4.1 4.3  Chloride 98 - 111 mmol/L 90(L) 92(L) 96(L)  CO2 22 -  32 mmol/L 38(H) 41(H) 38(H)  Calcium 8.9 - 10.3 mg/dL 9.0 9.2 8.7(L)  Total Protein 6.5 - 8.1 g/dL - - -  Total Bilirubin 0.3 - 1.2 mg/dL - - -  Alkaline Phos 38 - 126 U/L - - -  AST 15 - 41 U/L - - -  ALT 0 - 44 U/L - - -    CBC Latest Ref Rng & Units 02/04/2020 02/03/2020 02/02/2020  WBC 4.0 - 10.5 K/uL 9.1 8.9 10.8(H)  Hemoglobin 12.0 - 15.0 g/dL 12.2 10.8(L) 11.5(L)  Hematocrit 36 - 46 % 42.6 37.6 39.3  Platelets 150 - 400 K/uL 187 181 178    ABG    Component Value Date/Time   PHART 7.412 02/02/2020 1352   PCO2ART 67.7 (HH) 02/02/2020 1352   PO2ART 98.0 02/02/2020 1352   HCO3 38.8 (H) 02/02/2020  1352   O2SAT 97.3 02/02/2020 1352    CBG (last 3)  Recent Labs    02/04/20 0454 02/04/20 0859 02/04/20 1136  GLUCAP 120* 119* 144*    Critical care time: 32 minutes  Chesley Mires, MD [place] Pager - [phone] 02/04/2020, 2:24 PM

## 2020-02-04 NOTE — Consult Note (Signed)
[facility] Nurse Consult Note: Reason for Consult: Right foot, second digit with erythema, edema approximately 2 weeks post toenail trimming by family member. Wound type: Trauma Pressure Injury POA: NA Measurement: right foot second digit and 2-3 cm onto anterior right foot (erythema) Wound bed:N/A Drainage (amount, consistency, odor): small amount of serous Periwound:erythema, edema, warmth Dressing procedure/placement/frequency: I communicated via Secure Chat with the patient's hospitalist, Dr. Manuella Ghazi and have recommended consultation with Podiatry, Orthopedics or even Vascular surgery as this patient's toe injury is outside the scope of what [facility] can provide. I will provide Nursing with conservative topical care guidance using a betadine swabstick application applied twice daily after soap and water cleanse and pat dry. The area should be allow to air dry after the betadine application and no dressing applied.  Pressure redistribution heel boots are provided.  [facility] nursing team will not follow, but will remain available to this patient, the nursing and medical teams.  Please re-consult if needed. Thanks, Maudie Flakes, MSN, RN, [place], Ar[facility]  Pager# [phone]

## 2020-02-04 NOTE — Progress Notes (Signed)
ANTICOAGULATION CONSULT NOTE - Follow-Up Consult  Pharmacy Consult for Heparin Indication: atrial fibrillation  Allergies  Allergen Reactions   Bactrim [Sulfamethoxazole-Trimethoprim] Anaphylaxis   Amoxicillin Hives   Diclofenac Tinitus    swelling in feet   Morphine And Related Nausea And Vomiting and Other (See Comments)    headache   Potassium-Containing Compounds Itching    Patient Measurements: Height: 5\' 5"  (165.1 cm) Weight: 103.6 kg (228 lb 6.3 oz) IBW/kg (Calculated) : 57 Heparin Dosing Weight:  80 kg  Vital Signs: Temp: 98.8 F (37.1 C) (09/14 0400) Temp Source: Rectal (09/14 0400) BP: 123/65 (09/14 0645) Pulse Rate: 86 (09/14 0645)  Labs: Recent Labs    02/02/20 0710 02/02/20 0710 02/03/20 0611 02/04/20 0814  HGB 11.5*   < > 10.8* 12.2  HCT 39.3  --  37.6 42.6  PLT 178  --  181 187  HEPARINUNFRC <0.10*  --  0.32 0.46  CREATININE 0.37*  --  0.36* 0.42*   < > = values in this interval not displayed.    Estimated Creatinine Clearance: 85.9 mL/min (A) (by C-G formula based on SCr of 0.42 mg/dL (L)).  Assessment: Pharmacy was consulted to dose heparin in 64 yr old female with atrial fibrillation.  Patient was not on anticoagulation prior to admission.   Heparin level: 0.46- therapeutic CBC WNL   Goal of Therapy:  Heparin level 0.3-0.7 units/ml Monitor platelets by anticoagulation protocol: Yes   Plan:  Continue heparin infusion rate at 2250 units/hr  Monitor daily heparin level, CBC Monitor for signs/symptoms of bleeding  Margot Ables, PharmD Clinical Pharmacist 02/04/2020 9:15 AM

## 2020-02-05 ENCOUNTER — Inpatient Hospital Stay (HOSPITAL_COMMUNITY): Payer: Medicaid Other

## 2020-02-05 DIAGNOSIS — J90 Pleural effusion, not elsewhere classified: Secondary | ICD-10-CM | POA: Diagnosis not present

## 2020-02-05 DIAGNOSIS — I96 Gangrene, not elsewhere classified: Secondary | ICD-10-CM

## 2020-02-05 DIAGNOSIS — I4891 Unspecified atrial fibrillation: Secondary | ICD-10-CM | POA: Diagnosis not present

## 2020-02-05 DIAGNOSIS — D72829 Elevated white blood cell count, unspecified: Secondary | ICD-10-CM | POA: Diagnosis present

## 2020-02-05 HISTORY — DX: Unspecified atrial fibrillation: I48.91

## 2020-02-05 LAB — CBC
HCT: 43.2 % (ref 36.0–46.0)
Hemoglobin: 12.5 g/dL (ref 12.0–15.0)
MCH: 27.7 pg (ref 26.0–34.0)
MCHC: 28.9 g/dL — ABNORMAL LOW (ref 30.0–36.0)
MCV: 95.8 fL (ref 80.0–100.0)
Platelets: 211 10*3/uL (ref 150–400)
RBC: 4.51 MIL/uL (ref 3.87–5.11)
RDW: 15 % (ref 11.5–15.5)
WBC: 14 10*3/uL — ABNORMAL HIGH (ref 4.0–10.5)
nRBC: 0 % (ref 0.0–0.2)

## 2020-02-05 LAB — BASIC METABOLIC PANEL
Anion gap: 11 (ref 5–15)
BUN: 23 mg/dL (ref 8–23)
CO2: 35 mmol/L — ABNORMAL HIGH (ref 22–32)
Calcium: 8.9 mg/dL (ref 8.9–10.3)
Chloride: 90 mmol/L — ABNORMAL LOW (ref 98–111)
Creatinine, Ser: 0.34 mg/dL — ABNORMAL LOW (ref 0.44–1.00)
GFR calc Af Amer: >60 mL/min (ref >60)
GFR calc non Af Amer: >60 mL/min (ref >60)
Glucose, Bld: 130 mg/dL — ABNORMAL HIGH (ref 70–99)
Potassium: 4.9 mmol/L (ref 3.5–5.1)
Sodium: 136 mmol/L (ref 135–145)

## 2020-02-05 LAB — GLUCOSE, CAPILLARY
Glucose-Capillary: 110 mg/dL — ABNORMAL HIGH (ref 70–99)
Glucose-Capillary: 100 mg/dL — ABNORMAL HIGH (ref 70–99)
Glucose-Capillary: 108 mg/dL — ABNORMAL HIGH (ref 70–99)
Glucose-Capillary: 92 mg/dL (ref 70–99)
Glucose-Capillary: 87 mg/dL (ref 70–99)

## 2020-02-05 LAB — MAGNESIUM: Magnesium: 1.9 mg/dL (ref 1.7–2.4)

## 2020-02-05 LAB — HEPARIN LEVEL (UNFRACTIONATED): Heparin Unfractionated: 0.34 IU/mL (ref 0.30–0.70)

## 2020-02-05 MED ORDER — IOHEXOL 300 MG/ML  SOLN
75.0000 mL | Freq: Once | INTRAMUSCULAR | Status: AC | PRN
  Administered 2020-02-05: 75 mL via INTRAVENOUS

## 2020-02-05 MED ORDER — CHLORHEXIDINE GLUCONATE CLOTH 2 % EX PADS
6.0000 | MEDICATED_PAD | Freq: Once | CUTANEOUS | Status: AC
  Administered 2020-02-05: 6 via TOPICAL

## 2020-02-05 MED ORDER — CHLORHEXIDINE GLUCONATE CLOTH 2 % EX PADS: 6.0000 | MEDICATED_PAD | Freq: Once | CUTANEOUS | Status: AC

## 2020-02-05 MED ORDER — CEFAZOLIN SODIUM-DEXTROSE 2-4 GM/100ML-% IV SOLN
2.0000 g | Freq: Three times a day (TID) | INTRAVENOUS | Status: DC
  Administered 2020-02-05 – 2020-02-12 (×20): 2 g via INTRAVENOUS
  Filled 2020-02-05 (×21): qty 100

## 2020-02-05 NOTE — Progress Notes (Addendum)
PROGRESS NOTE    [name] [name]  NID:[id number] DOB: 03-29-1956 DOA: 01/26/2020 PCP: Leonard Downing, MD   Brief Narrative:  Per HPI: [name] [name] a 64 y.o.femalewith medical history significant forCOPD on 2L [place] at bedtime, hypertension, type 2 diabetes, obesity, bipolar disorder/anxiety disorder, ongoing tobacco use,questionable ongoing alcohol abuse,and prior polysubstance abuse with methamphetamine and alcohol who presented to the ED with progressive worsening of pain, erythema, and serosanguineous drainage from her right second toe. This began to gradually develop after her daughter trimmed her nail too close to her skin approximately 2 weeks ago. She has noticed some fever, chills, body aches, and mild headache as well as general malaise at home. She tried soaking her foot at home with no improvement in her symptoms. She denies any chest pain, shortness of breath, abdominal pain, nausea, vomiting, or dysuria. She denies any upper respiratory symptoms as well. She has not been vaccinated for COVID-19.  ED Course:Vital signs demonstrate ongoing sinus tachycardia and this is confirmed on EKG with heart rate approximately 110 bpm. Temperature 1-1.5 Fahrenheit. She is noted to have a leukocytosis of 12,900 as well as a thrombocytopenia of 129,000. BUN is 38 and creatinine is elevated at 1.1 with baseline 0.3-0.5. Chest x-ray with mild cardiomegaly and no other acute findings. Covid testing negative. Right foot x-ray with no soft tissue swelling or signs of osteomyelitis noted. Lactic acid is 1.5. Patient has been ordered and started 3 L fluid bolus as well as vancomycin and Rocephin for suspected sepsis secondary to cellulitis.  9/14: Patient remains sedated on ventilator this morning with PCCM following.  She is started to have some signs of ileus.  Tube feedings have been discontinued.  Plan to restart today and see if this is tolerated.  Try bisacodyl suppository  as well.  Continue IV antibiotics with vancomycin to treat group B strep bacteremia as well as what appears to be worsening cellulitis and mild fasciitis to right lower extremity.  TEE suspended until patient's clinical condition further improved.  9/15: Pt remains on ventilator.  Concerning right leg lesions and toe lesion, surgery consult requested.  Palliative medicine consulted for goals of care discussions.    Assessment & Plan:   Active Problems:   Acute respiratory failure with hypoxia and hypercapnia (HCC)   Sepsis (HCC)   Cellulitis of right lower leg   Atrial fibrillation with RVR (HCC)   Pleural effusion on right   Leukocytosis   1-sepsis: In the setting of Group A Streptococcus cellulitis/bacteremia. Present on admission -Patient met sepsis criteria on admission with sinus tachycardia, elevated white blood cells, febrile and with identified source of infection of cellulitic process. She had acute kidney injury as organ dysfunction. -No signs of osteomyelitis onfootchest x-ray -Blood culture positive for Group A Streptococcus -Continue on vancomycin for both MRSA coverage along with Group A strep -Transthoracic echocardiogram with LVEF 55 to 60% and no vegetations noted, appreciate cardiology consultation for TEEonce extubated -CT right lower extremity on 9/11 with findings of diffuse soft tissue edema and moderate fasciitis, no abscess or fluid collection noted. -Continue as needed antipyretics and supportive care.  Wet Gangrene Right 2nd toe and right leg with necrotic black areas concerning and I have asked for a surgical consultation.  Pt likely to go to OR on 9/16 for right 2nd toe amputation  2-acute on chronic respiratory failure with hypoxia and hypercapnias/p intubation -Appears to be a combination of COPD exacerbation, Underlying obesity hypoventilation syndrome and component of interstitial pulmonary edema/vascular congestion  after fluid resuscitation as per  sepsis protocol. -Worsening hypoxemia on 9/12 that has now improved after diuresis, holding further Lasix for now due to poor intake -PCCM following with further recommendations appreciated  3-acute kidney injury-resolved -Baseline creatinine 0.6-0.7, currently around0.4 -Monitor labs closely with Lasix for diuresis prn -Continue to monitor in a.m. -May need to start IV fluid if patient cannot tolerate tube feeds.  4-mild thrombocytopenia-resolved -Likely related to acute sepsis and underlying history of alcohol abuse -No signs of overt bleeding -Monitor carefully while on heparin drip -Follow CBC.  5-alcohol abuse -Continue CIWA monitoring -Continue thiamine and folic acid.  6-class II obesity  -Body mass index is 36.58 kg/m. -Low calorie diet, portion control and increase physical activity discussed with patient.  7-history of hypertension-stable -Monitor closely with IV Lasix ordered -Clonidine 3 times daily per PCCM -Hydrocortisone discontinued. -Continue oral Cardizem -Labetalol as needed  8-history of tobacco abuse -Cessation counseling has been provided. -Continue nicotine patch.  9-depression/anxiety/history of seizures -Plan is to resume psychotropic home medications once able to take by mouth. -Continue Depakote  10-type 2 diabetes mellitus -A1c 5.8 -While inpatient continue sliding scale insulin -At discharge will resume the use of Metformin in order to continue assisting with management of insulin resistant and obesity. -Modified carbohydrate diet and lifestylerecommended after discharge.  CBG (last 3)  Recent Labs    02/05/20 0440 02/05/20 0852 02/05/20 1225  GLUCAP 110* 100* 108*    11-new onset atrial fibrillation with RVR-currently in sinus rhythm -Continue oral Cardizem as ordered -TSH 0.294, check freeT3 0.7, T4 0.75 -2D echocardiogram with LVEF 55-60% -CHA2DS2-VASc score of 3 for which heparin driphad been started, may not  require anticoagulation on discharge if this appears to simply be an isolated event. -Appreciatecardiology recommendations for eventual TEE once extubated  12-Hypokalemia-resolved -Monitor  13-ileus with poor tolerance of tube feeds -Trial of bisacodyl suppository today -Maintain magnesium and potassium of 2 and 4 respectively -Pt continues to vomit tube feeds. HOLD TUBE FEEDS FOR NOW.    DVT prophylaxis:Heparin drip Code Status:DNR after discussion with both daughters they were agreeable to DNR status Family Communication:Discussed with daughter at bedside9/14, 9/15, daughter [name] [phone] Disposition Plan: Status is: Inpatient  Remains inpatient appropriate because:Ongoing diagnostic testing needed not appropriate for outpatient work up, IV treatments appropriate due to intensity of illness or inability to take PO and Inpatient level of care appropriate due to severity of illness  Dispo: The patient is from:Home Anticipated d/c is to:SNF Anticipated d/c date is: > 3 days Patient currently is not medically stable to d/c.Patient currently remains intubated on the ventilator and will need further evaluation per cardiology along with ongoing evaluation per PCCM.  Consultants:  PCCM  Cardiology  Procedures:  2D echocardiogram 9/8 with LVEF 55-60%. No vegetations.  Antimicrobials:  Anti-infectives (From admission, onward)   Start     Dose/Rate Route Frequency Ordered Stop   02/05/20 2200  ceFAZolin (ANCEF) IVPB 2g/100 mL premix        2 g 200 mL/hr over 30 Minutes Intravenous Every 8 hours 02/05/20 1309     02/04/20 2200  clindamycin (CLEOCIN) IVPB 600 mg        600 mg 100 mL/hr over 30 Minutes Intravenous Every 8 hours 02/04/20 2018     02/02/20 1100  vancomycin (VANCOREADY) IVPB 1500 mg/300 mL  Status:  Discontinued        1,500 mg 150 mL/hr over 120 Minutes Intravenous Every 24 hours 02/01/20 1225 02/05/20  1308  02/01/20 1130  vancomycin (VANCOREADY) IVPB 2000 mg/400 mL        2,000 mg 200 mL/hr over 120 Minutes Intravenous  Once 02/01/20 1108 02/01/20 2010   01/27/20 1500  vancomycin (VANCOREADY) IVPB 1500 mg/300 mL  Status:  Discontinued        1,500 mg 150 mL/hr over 120 Minutes Intravenous Every 24 hours 01/26/20 1428 01/27/20 0827   01/27/20 0830  ceFAZolin (ANCEF) IVPB 2g/100 mL premix  Status:  Discontinued        2 g 200 mL/hr over 30 Minutes Intravenous Every 8 hours 01/27/20 0827 02/01/20 1222   01/26/20 1545  vancomycin (VANCOCIN) IVPB 1000 mg/200 mL premix  Status:  Discontinued        1,000 mg 200 mL/hr over 60 Minutes Intravenous  Once 01/26/20 1541 01/26/20 1553   01/26/20 1415  vancomycin (VANCOCIN) IVPB 1000 mg/200 mL premix  Status:  Discontinued        1,000 mg 200 mL/hr over 60 Minutes Intravenous  Once 01/26/20 1408 01/26/20 1413   01/26/20 1415  cefTRIAXone (ROCEPHIN) 2 g in sodium chloride 0.9 % 100 mL IVPB        2 g 200 mL/hr over 30 Minutes Intravenous  Once 01/26/20 1408 01/26/20 2011   01/26/20 1415  vancomycin (VANCOREADY) IVPB 2000 mg/400 mL        2,000 mg 200 mL/hr over 120 Minutes Intravenous  Once 01/26/20 1413 01/26/20 2011      Subjective: Patient seen and evaluated today with some nausea and vomiting noted yesterday evening.  Tube feeds have been discontinued as a result.  No bowel movements noted.  No other acute events noted.  Objective: Vitals:   02/05/20 0906 02/05/20 0908 02/05/20 1228 02/05/20 1300  BP:    (!) 121/48  Pulse:   94 95  Resp:   18 18  Temp:   98.3 F (36.8 C)   TempSrc:   Oral   SpO2: 93% 93% 95% 92%  Weight:      Height:        Intake/Output Summary (Last 24 hours) at 02/05/2020 1421 Last data filed at 02/05/2020 0400 Gross per 24 hour  Intake 1039.77 ml  Output 650 ml  Net 389.77 ml   Filed Weights   02/03/20 0500 02/04/20 0500 02/05/20 0500  Weight: 105.9 kg 103.6 kg 104.8 kg    Examination:  General  exam: Appears sedated on ventilator Respiratory system: Coarse bilaterally.  Intubated on FiO2 40% Cardiovascular system: S1 & S2 heard, RRR.  Gastrointestinal system: Abdomen minimally distended, bowel sounds present. Central nervous system: Sedated on ventilator Extremities: necrotic black areas on right leg with right 2nd toe appearing to have wet gangrene Skin:       As above Psychiatry: Cannot be assessed  Data Reviewed: I have personally reviewed following labs and imaging studies  CBC: Recent Labs  Lab 02/01/20 0613 02/02/20 0710 02/03/20 0611 02/04/20 0814 02/05/20 0424  WBC 10.0 10.8* 8.9 9.1 14.0*  HGB 11.7* 11.5* 10.8* 12.2 12.5  HCT 40.0 39.3 37.6 42.6 43.2  MCV 95.9 97.3 97.2 99.3 95.8  PLT 153 178 181 187 176   Basic Metabolic Panel: Recent Labs  Lab 01/29/20 1632 01/29/20 1632 01/30/20 0505 01/30/20 1512 01/31/20 0607 02/01/20 0613 02/02/20 0710 02/03/20 0611 02/04/20 0814 02/05/20 0424  NA  --   --  140  --    < > 142 142 142 140 136  K  --   --  3.5  --    < >  4.2 4.3 4.1 4.2 4.9  CL  --   --  97*  --    < > 97* 96* 92* 90* 90*  CO2  --   --  34*  --    < > 35* 38* 41* 38* 35*  GLUCOSE  --   --  204*  --    < > 156* 155* 180* 114* 130*  BUN  --   --  72*  --    < > 32* 28* 28* 27* 23  CREATININE  --   --  0.62  --    < > 0.38* 0.37* 0.36* 0.42* 0.34*  CALCIUM  --   --  8.9  --    < > 8.6* 8.7* 9.2 9.0 8.9  MG 2.4   < > 2.2 2.1  --   --   --  1.7 2.2 1.9  PHOS 1.9*  --  1.9* 2.3*  --   --   --   --   --   --    < > = values in this interval not displayed.   GFR: Estimated Creatinine Clearance: 86.5 mL/min (A) (by C-G formula based on SCr of 0.34 mg/dL (L)). Liver Function Tests: No results for input(s): AST, ALT, ALKPHOS, BILITOT, PROT, ALBUMIN in the last 168 hours. No results for input(s): LIPASE, AMYLASE in the last 168 hours. No results for input(s): AMMONIA in the last 168 hours. Coagulation Profile: No results for input(s): INR,  PROTIME in the last 168 hours. Cardiac Enzymes: No results for input(s): CKTOTAL, CKMB, CKMBINDEX, TROPONINI in the last 168 hours. BNP (last 3 results) No results for input(s): PROBNP in the last 8760 hours. HbA1C: No results for input(s): HGBA1C in the last 72 hours. CBG: Recent Labs  Lab 02/04/20 1946 02/04/20 2348 02/05/20 0440 02/05/20 0852 02/05/20 1225  GLUCAP 99 101* 110* 100* 108*   Lipid Profile: Recent Labs    02/03/20 0611  TRIG 145   Thyroid Function Tests: No results for input(s): TSH, T4TOTAL, FREET4, T3FREE, THYROIDAB in the last 72 hours. Anemia Panel: No results for input(s): VITAMINB12, FOLATE, FERRITIN, TIBC, IRON, RETICCTPCT in the last 72 hours. Sepsis Labs: No results for input(s): PROCALCITON, LATICACIDVEN in the last 168 hours.  Recent Results (from the past 240 hour(s))  Culture, blood (Routine X 2) w Reflex to ID Panel     Status: None   Collection Time: 01/28/20  1:01 PM   Specimen: BLOOD RIGHT HAND  Result Value Ref Range Status   Specimen Description BLOOD RIGHT HAND  Final   Special Requests   Final    BOTTLES DRAWN AEROBIC ONLY Blood Culture results may not be optimal due to an inadequate volume of blood received in culture bottles   Culture   Final    NO GROWTH 6 DAYS Performed at [facility], [address] [place], [place] [address]    Report Status 02/03/2020 FINAL  Final  Culture, blood (Routine X 2) w Reflex to ID Panel     Status: None   Collection Time: 01/28/20  1:01 PM   Specimen: BLOOD RIGHT WRIST  Result Value Ref Range Status   Specimen Description BLOOD RIGHT WRIST  Final   Special Requests   Final    BOTTLES DRAWN AEROBIC AND ANAEROBIC Blood Culture adequate volume   Culture   Final    NO GROWTH 6 DAYS Performed at [facility], [address] [place], [place] [address]    Report Status 02/03/2020  FINAL  Final  MRSA PCR Screening     Status: None   Collection Time: 01/29/20 11:17 AM   Specimen: Nasal Mucosa;  Nasopharyngeal  Result Value Ref Range Status   MRSA by PCR NEGATIVE NEGATIVE Final    Comment:        The GeneXpert MRSA Assay (FDA approved for NASAL specimens only), is one component of a comprehensive MRSA colonization surveillance program. It is not intended to diagnose MRSA infection nor to guide or monitor treatment for MRSA infections. Performed at [facility], [address], [place], [place] [address]   Urine Culture     Status: None   Collection Time: 02/01/20  8:40 PM   Specimen: Urine, Clean Catch  Result Value Ref Range Status   Specimen Description   Final    URINE, CLEAN CATCH Performed at [facility], [address] [place], [place] [address]    Special Requests   Final    NONE Performed at [facility], [address] [place], [place] [address]    Culture   Final    NO GROWTH Performed at [facility], [place] [address] [place], [place] [address]    Report Status 02/03/2020 FINAL  Final    Radiology Studies: DG CHEST PORT 1 VIEW  Result Date: 02/05/2020 CLINICAL DATA:  ICU patient with negative COVID test, currently intubated EXAM: PORTABLE CHEST 1 VIEW COMPARISON:  02/05/2020 FINDINGS: Endotracheal tube projects between clavicular heads approximately 5.4 cm above the carina. Gastric tube terminating in the upper abdomen the area of the proximal stomach. Cardiomediastinal contours are enlarged accentuated by portable technique and rotation to the RIGHT. The engorgement of central pulmonary vascular structures, a RIGHT heart border obscured by RIGHT lower lobe airspace disease and effusion. Background interstitial markings increased. No lobar consolidation or sign of effusion on the LEFT. Evidence of prior trauma to the LEFT lateral ribs as before.  The IMPRESSION: 1. Findings of RIGHT-sided effusion and basilar collapse/consolidation with worsening over a series of prior studies is similar to the most recent exam. 2. Endotracheal tube in place, tip between  clavicular heads. 3. Background interstitial prominence may represent pneumonitis or edema similar to the prior study. Electronically Signed   By: Zetta Bills M.D.   On: 02/05/2020 09:53   DG Chest Port 1 View  Result Date: 02/05/2020 CLINICAL DATA:  Check endotracheal tube placement EXAM: PORTABLE CHEST 1 VIEW COMPARISON:  02/04/2020 FINDINGS: Cardiac shadow is stable. Endotracheal tube and gastric catheter are seen and stable. Increasing right-sided pleural effusion and likely underlying infiltrate is noted. Left lung remains clear. No bony abnormality is noted. IMPRESSION: Increasing right-sided pleural effusion and likely underlying infiltrate. Electronically Signed   By: Inez Catalina M.D.   On: 02/05/2020 06:50   DG Chest Port 1 View  Result Date: 02/04/2020 CLINICAL DATA:  Intubation EXAM: PORTABLE CHEST 1 VIEW COMPARISON:  Two days ago FINDINGS: Endotracheal tube with tip at the clavicular heads. The enteric tube reaches the stomach. Worsening aeration at the right base. Cardiomegaly. Remote left rib fracture. IMPRESSION: Worsening aeration at the right base which could be pneumonia or atelectasis. Electronically Signed   By: Monte Fantasia M.D.   On: 02/04/2020 07:27   Scheduled Meds: . acetylcysteine  2 mL Nebulization BID  . arformoterol  15 mcg Nebulization BID  . benztropine  0.5 mg Per Tube BID  . budesonide (PULMICORT) nebulizer solution  0.5 mg Nebulization BID  . chlorhexidine gluconate (MEDLINE KIT)  15 mL  Mouth Rinse BID  . Chlorhexidine Gluconate Cloth  6 each Topical Daily  . Chlorhexidine Gluconate Cloth  6 each Topical Once   And  . Chlorhexidine Gluconate Cloth  6 each Topical Once  . cloNIDine  0.2 mg Per Tube TID  . diltiazem  60 mg Per Tube Q6H  . docusate  100 mg Per Tube BID  . feeding supplement (VITAL HIGH PROTEIN)  1,000 mL Per Tube Q24H  . folic acid  1 mg Per Tube Daily  . insulin aspart  0-9 Units Subcutaneous Q4H  . mouth rinse  15 mL Mouth Rinse 10  times per day  . nicotine  7 mg Transdermal Daily  . pantoprazole sodium  40 mg Per Tube Q24H  . polyethylene glycol  17 g Oral Daily  . thiamine  100 mg Oral Daily   Or  . thiamine  100 mg Intravenous Daily  . valproic acid  500 mg Per Tube BID   Continuous Infusions: . sodium chloride 10 mL/hr at 01/29/20 1401  .  ceFAZolin (ANCEF) IV    . clindamycin (CLEOCIN) IV 600 mg (02/05/20 0557)  . fentaNYL infusion INTRAVENOUS 150 mcg/hr (02/05/20 1121)  . heparin 2,250 Units/hr (02/05/20 1227)  . midazolam 10 mg/hr (02/05/20 0815)     LOS: 10 days   Critical Care Procedure Note Authorized and Performed by: Murvin Natal MD  Total Critical Care time:  40 mins Due to a high probability of clinically significant, life threatening deterioration, the patient required my highest level of preparedness to intervene emergently and I personally spent this critical care time directly and personally managing the patient.  This critical care time included obtaining a history; examining the patient, pulse oximetry; ordering and review of studies; arranging urgent treatment with development of a management plan; evaluation of patient's response of treatment; frequent reassessment; and discussions with other providers.  This critical care time was performed to assess and manage the high probability of imminent and life threatening deterioration that could result in multi-organ failure.  It was exclusive of separately billable procedures and treating other patients and teaching time.    Irwin Brakeman, MD How to contact the [facility] Attending or Consulting provider [place] or covering provider during after hours [place], for this patient?  1. Check the care team in [facility] and look for a) attending/consulting [facility] provider listed and b) the [facility] team listed 2. Log into [email or web address] and use [facility] universal password to access. If you do not have the password, please contact the hospital operator. 3. Locate the [facility]  provider you are looking for under [facility] and page to a number that you can be directly reached. 4. If you still have difficulty reaching the provider, please page the [facility] (Director on Call) for the Hospitalists listed on amion for assistance.   If 7PM-7AM, please contact night-coverage [email or web address] 02/05/2020, 2:21 PM

## 2020-02-05 NOTE — Progress Notes (Signed)
NAME:  Kalijah Westfall, MRN:  [id number], DOB:  12-29-1955, LOS: 6 ADMISSION DATE:  01/26/2020, CONSULTATION DATE:  01/28/20 REFERRING MD:  [facility]/ [facility], CHIEF COMPLAINT:  resp distress    Brief History   64 yo female smoker presented 9/05 with Rt foot pain and swelling.  This was associated with chills, myalgias from sepsis with cellulitis.  Developed A fib with RV and then hypoxia.  Required intubation 9/07 and PCCM consulted.  Past Medical History  COPD on home oxygen, HTN, DM, Bipolar  Significant Hospital Events   9/05 Admit 9/07 intubated 9/08 back in sinus rhythm 9/14 fever, Abx changed  Consults:  Cardiology  Procedures:  ETT 9/07 >>   Significant Diagnostic Tests:   Echo 9/08 >> EF 55 to 60%  CT Rt leg 9/11 >> diffuse subcutaneous soft tissue swelling, moderate myofasciitis involving mid to lower calf, no osteomyelitis or septic arthritis  Micro Data:  COVID 9/05 >> negative MRSA PCR 9/08 >> negative Blood 9/05 >> Group B Streptococcus Blood 9/07 >>   Antimicrobials:  Vancomycin 9/05 Rocephin 9/05 Ancef 9/06 >> 9/10 Vancomycin 9/11 >>  Clindamycin 9/14 >>  Ancef 9/15 >>   Interim history/subjective:  Fever overnight.  Remains on sedation.  Objective   Blood pressure (!) 115/44, pulse 93, temperature 98.3 F (36.8 C), temperature source Oral, resp. rate 20, height 5\' 5"  (1.651 m), weight 104.8 kg, SpO2 93 %.    Vent Mode: PRVC FiO2 (%):  [45 %-50 %] 45 % Set Rate:  [16 bmp] 16 bmp Vt Set:  [450 mL] 450 mL PEEP:  [5 cmH20] 5 cmH20 Plateau Pressure:  [20 cmH20-23 cmH20] 21 cmH20   Intake/Output Summary (Last 24 hours) at 02/05/2020 1447 Last data filed at 02/05/2020 1435 Gross per 24 hour  Intake 1891.18 ml  Output 650 ml  Net 1241.18 ml   Filed Weights   02/03/20 0500 02/04/20 0500 02/05/20 0500  Weight: 105.9 kg 103.6 kg 104.8 kg    Examination:  General - sedated Eyes - pupils pinpoint ENT - ETT in place Cardiac - regular rate/rhythm,  no murmur Chest - decreased BS Rt base Abdomen - soft, non tender, + bowel sounds Extremities - 1+ edema Skin - Rt lower leg erythematous with eschar Neuro - RASS -2  Resolved Hospital Problem list      Assessment & Plan:   Acute on chronic hypoxic/hypercapnic respiratory failure from acute pulmonary edema in setting of A fib with RVR, COPD exacerbation, and mucus plugging. - continue BDs - f/u CXR - continue mucomyst for now - might need bronchoscopy if Rt lower lung doesn't clear up more - not ready for vent weaning at this time  Group B Streptococcal bacteremia 2nd to Rt lower leg cellulitis. - ABx per primary team  Transient A fib with RVR. - back in sinus rhythm  Acute metabolic encephalopathy from sepsis, hypoxia. Hx of ETOH, depression, anxiety, seizures. - versed gtt with prn fentanyl for RASS goa 0 to -1  Best practice:  Diet: tube feeds DVT prophylaxis: heparin gtt GI prophylaxis: protonix Mobility: bed rest Code Status: full  Disposition: ICU  Labs    CMP Latest Ref Rng & Units 02/05/2020 02/04/2020 02/03/2020  Glucose 70 - 99 mg/dL 130(H) 114(H) 180(H)  BUN 8 - 23 mg/dL 23 27(H) 28(H)  Creatinine 0.44 - 1.00 mg/dL 0.34(L) 0.42(L) 0.36(L)  Sodium 135 - 145 mmol/L 136 140 142  Potassium 3.5 - 5.1 mmol/L 4.9 4.2 4.1  Chloride 98 - 111 mmol/L  90(L) 90(L) 92(L)  CO2 22 - 32 mmol/L 35(H) 38(H) 41(H)  Calcium 8.9 - 10.3 mg/dL 8.9 9.0 9.2  Total Protein 6.5 - 8.1 g/dL - - -  Total Bilirubin 0.3 - 1.2 mg/dL - - -  Alkaline Phos 38 - 126 U/L - - -  AST 15 - 41 U/L - - -  ALT 0 - 44 U/L - - -    CBC Latest Ref Rng & Units 02/05/2020 02/04/2020 02/03/2020  WBC 4.0 - 10.5 K/uL 14.0(H) 9.1 8.9  Hemoglobin 12.0 - 15.0 g/dL 12.5 12.2 10.8(L)  Hematocrit 36 - 46 % 43.2 42.6 37.6  Platelets 150 - 400 K/uL 211 187 181    ABG    Component Value Date/Time   PHART 7.412 02/02/2020 1352   PCO2ART 67.7 (HH) 02/02/2020 1352   PO2ART 98.0 02/02/2020 1352   HCO3 38.8  (H) 02/02/2020 1352   O2SAT 97.3 02/02/2020 1352    CBG (last 3)  Recent Labs    02/05/20 0440 02/05/20 0852 02/05/20 1225  GLUCAP 110* 100* 108*    Critical care time: 32 minutes  Chesley Mires, MD [place] Pager - [phone] 02/05/2020, 2:47 PM

## 2020-02-05 NOTE — Progress Notes (Signed)
Telemetry reviewed, no recurrent afib. A few short runs of SVT.    She is on dilt oral 60mg  every 6 hours, hep gtt. Isolated episode in the setting of systemic illness, if no significant recurrence prior to discharge would not start oral anticoag and plan for outpatient monitor. Continue short acting dilt, can consolidate to long acting closer to discharge and outside of ICU setting.     TEE once extubated and more stable from a respiratory standpoint, please call us back when she gets to this point or if recurrent issues with afib issues  Carlyle Dolly MD

## 2020-02-05 NOTE — Progress Notes (Signed)
Phlebotomy in this morning to draw labs when patient vomited over ET tube. This nurse made aware. Assessed patient and suctioned both oral and airway. Vital signs stable at this time. This is now the third day in a row in which patient has vomited over ET tube. Tube feedings cut off again this morning. Dr. Wynetta Emery notified.

## 2020-02-05 NOTE — Progress Notes (Signed)
RT transported patient to and from CT with no complications noted with RN at bedside.

## 2020-02-05 NOTE — Progress Notes (Signed)
[facility]  No gas on CT. Extensive cellulitis suggested. Will not be able to do MRI due to availability for intubated patient. Will have to monitor exam and determine course of action with debridement and toe amp tomorrow versus above knee as long as family wants to proceed.  Curlene Labrum, MD

## 2020-02-05 NOTE — H&P (View-Only) (Signed)
[facility] Consult  Reason for Consult: Right 2nd toe infection; right leg cellulitis  Referring Physician:  Dr. Wynetta Emery  Chief Complaint    Toe Pain; Code Sepsis      HPI: [name] [name] is a 64 y.o. female with COPD, HTN, DM, obesity and admission for cellulitis of the right lower extremity over 1 week ago who also reported drainage from the right second toe. She had been having fevers and body aches. She was found to have a fever on arrival and was admitted for IV antibiotics At that time she was also found to have AKI and was septic. She was admitted to the floor but soon developed some aspiration and was intubated and transferred to the ICU. She now has been found to be bacteremic with Group B strep presumably from her infected toe and has a right sided consolidation/ PNA and worsening effusion. She has not been able to wean on the ventilator per report from the RN.  A CT was done of the Right tib/fib on 9/11 that demonstrated some signs of cellulitis and myositis but the toe was not included in this evaluation. Xrays of the foot did not indicate any concern for osteo per H&P.   Over the last day the RNs says her right lower extremity redness and swelling has worsened. There are also blisters on this now. She has been on heparin gtt for A fib. She has been febrile and is sedated and not really responsive.   I called her daughter, [name] to discuss the patient's current status and prognosis.    Past Medical History:  Diagnosis Date  . Anxiety   . Bipolar 1 disorder ([place])   . Cancer ([place])    colon  . Chronic diarrhea    Followed by Dr Michail Sermon   . Complication of anesthesia    Hx resp distress during surgery  . COPD (chronic obstructive pulmonary disease) ([place])   . Depression   . Diabetes mellitus ([place])   . External hemorrhoids   . GI bleed   . Headache(784.0)   . Hyperlipidemia   . Hypertension   . Hypothyroidism   . Insomnia   . Internal hemorrhoids   .  Polysubstance abuse ([place]) Quit 2008    H/O methamphetamine and alcohol abuse, clean x 6 years  . Shortness of breath   . Suicidal thoughts   . Tobacco abuse   . Tubular adenoma of colon     Past Surgical History:  Procedure Laterality Date  . ABDOMINAL HYSTERECTOMY    . APPENDECTOMY    . COLON SURGERY     "colon repair"  . COLONOSCOPY WITH PROPOFOL N/A 12/11/2012   Procedure: COLONOSCOPY WITH PROPOFOL;  Surgeon: Jerene Bears, MD;  Location: [facility];  Service: Gastroenterology;  Laterality: N/A;  . FINGER SURGERY     middle left finger  . HERNIA REPAIR     x 2  . KNEE SURGERY Left    Left knee tendon surgery  . TONSILLECTOMY    . TUBAL LIGATION      Family History  Problem Relation Age of Onset  . Multiple sclerosis Mother   . Alcoholism Father   . Depression Brother   . Emphysema Maternal Grandfather        smoked  . Breast cancer Neg Hx   . Colon cancer Neg Hx   . Esophageal cancer Neg Hx   . Colon polyps Neg Hx   . Stomach cancer Neg Hx   .  Pancreatic cancer Neg Hx   . Liver disease Neg Hx     Social History   Tobacco Use  . Smoking status: Current Every Day Smoker    Packs/day: 1.00    Years: 42.00    Pack years: 42.00    Types: Cigarettes  . Smokeless tobacco: Never Used  Substance Use Topics  . Alcohol use: Yes    Comment: occ  . Drug use: No    Medications: I have reviewed the patient's current medications. Current Facility-Administered Medications  Medication Dose Route Frequency Provider Last Rate Last Admin  . 0.9 %  sodium chloride infusion   Intravenous PRN Barton Dubois, MD 10 mL/hr at 01/29/20 1401 Restarted at 01/29/20 1401  . acetaminophen (TYLENOL) tablet 650 mg  650 mg Per Tube Q6H PRN Chesley Mires, MD       Or  . acetaminophen (TYLENOL) suppository 650 mg  650 mg Rectal Q6H PRN Chesley Mires, MD      . acetylcysteine (MUCOMYST) 20 % nebulizer / oral solution 2 mL  2 mL Nebulization BID Chesley Mires, MD   2 mL at 02/05/20 0854  .  arformoterol (BROVANA) nebulizer solution 15 mcg  15 mcg Nebulization BID Barton Dubois, MD   15 mcg at 02/05/20 0908  . benztropine (COGENTIN) tablet 0.5 mg  0.5 mg Per Tube BID Lang Snow, FNP   0.5 mg at 02/05/20 0957  . budesonide (PULMICORT) nebulizer solution 0.5 mg  0.5 mg Nebulization BID Barton Dubois, MD   0.5 mg at 02/05/20 0905  . ceFAZolin (ANCEF) IVPB 2g/100 mL premix  2 g Intravenous Q8H Johnson, Clanford L, MD      . chlorhexidine gluconate (MEDLINE KIT) (PERIDEX) 0.12 % solution 15 mL  15 mL Mouth Rinse BID Barton Dubois, MD   15 mL at 02/05/20 0818  . Chlorhexidine Gluconate Cloth 2 % PADS 6 each  6 each Topical Daily Barton Dubois, MD   6 each at 02/05/20 [phone]  . Chlorhexidine Gluconate Cloth 2 % PADS 6 each  6 each Topical Once Virl Cagey, MD      . clindamycin (CLEOCIN) IVPB 600 mg  600 mg Intravenous Q8H Shah, Pratik D, DO 100 mL/hr at 02/05/20 1435 Rate Verify at 02/05/20 1435  . cloNIDine (CATAPRES) tablet 0.2 mg  0.2 mg Per Tube TID Tanda Rockers, MD   0.2 mg at 02/05/20 0957  . diltiazem (CARDIZEM) tablet 60 mg  60 mg Per Tube Q6H Arnoldo Lenis, MD   60 mg at 02/05/20 0556  . docusate (COLACE) 50 MG/5ML liquid 100 mg  100 mg Per Tube BID Lang Snow, FNP   100 mg at 02/05/20 0957  . feeding supplement (VITAL HIGH PROTEIN) liquid 1,000 mL  1,000 mL Per Tube Q24H Manuella Ghazi, Pratik D, DO   1,000 mL at 02/04/20 1223  . fentaNYL 2564mg in NS 2525m(1053mml) infusion-PREMIX  0-400 mcg/hr Intravenous Continuous ShaHeath Lark DO 15 mL/hr at 02/05/20 1435 150 mcg/hr at 02/05/20 1435  . folic acid (FOLVITE) tablet 1 mg  1 mg Per Tube Daily DenLang SnowNP   1 mg at 02/05/20 0957  . heparin ADULT infusion 100 units/mL (25000 units/250m2mdium chloride 0.45%)  2,250 Units/hr Intravenous Continuous Johnson, Clanford L, MD 22.5 mL/hr at 02/05/20 1435 2,250 Units/hr at 02/05/20 1435  . insulin aspart (novoLOG) injection 0-9 Units  0-9 Units Subcutaneous Q4H  MadeBarton Dubois   1 Units at 02/04/20 1230  . labetalol (NORMODYNE) injection 10  mg  10 mg Intravenous Q2H PRN Manuella Ghazi, Pratik D, DO      . levalbuterol (XOPENEX) nebulizer solution 0.63 mg  0.63 mg Nebulization Q6H PRN Chesley Mires, MD   0.63 mg at 02/05/20 0854  . MEDLINE mouth rinse  15 mL Mouth Rinse 10 times per day Barton Dubois, MD   15 mL at 02/05/20 1435  . midazolam (VERSED) 50 mg/50 mL (1 mg/mL) premix infusion  0.5-10 mg/hr Intravenous Continuous Barton Dubois, MD 9 mL/hr at 02/05/20 1435 9 mg/hr at 02/05/20 1435  . nicotine (NICODERM CQ - dosed in mg/24 hr) patch 7 mg  7 mg Transdermal Daily Barton Dubois, MD   7 mg at 02/05/20 0956  . ondansetron (ZOFRAN) tablet 4 mg  4 mg Oral Q6H PRN Barton Dubois, MD       Or  . ondansetron [facility]) injection 4 mg  4 mg Intravenous Q6H PRN Barton Dubois, MD   4 mg at 02/05/20 0816  . pantoprazole sodium (PROTONIX) 40 mg/20 mL oral suspension 40 mg  40 mg Per Tube Q24H Chesley Mires, MD   40 mg at 02/05/20 1434  . polyethylene glycol (MIRALAX / GLYCOLAX) packet 17 g  17 g Oral Daily Barton Dubois, MD   17 g at 02/05/20 0957  . thiamine tablet 100 mg  100 mg Oral Daily Barton Dubois, MD   100 mg at 02/04/20 0910   Or  . thiamine (B-1) injection 100 mg  100 mg Intravenous Daily Barton Dubois, MD   100 mg at 02/05/20 0955  . valproic acid (DEPAKENE) 250 MG/5ML solution 500 mg  500 mg Per Tube BID Barton Dubois, MD   500 mg at 02/05/20 1552    Allergies  Allergen Reactions  . Bactrim [Sulfamethoxazole-Trimethoprim] Anaphylaxis  . Amoxicillin Hives  . Diclofenac Tinitus    swelling in feet  . Morphine And Related Nausea And Vomiting and Other (See Comments)    headache  . Potassium-Containing Compounds Itching     ROS:  Review of systems not obtained due to patient factors.  Blood pressure (!) 119/47, pulse 94, temperature 98.3 F (36.8 C), temperature source Oral, resp. rate 18, height _0  (1.651 m), weight 104.8 kg, SpO2 95  %. Physical Exam Vitals reviewed.  Constitutional:      Comments: sedated  HENT:     Head: Atraumatic.     Nose: Nose normal.  Cardiovascular:     Rate and Rhythm: Normal rate.     Pulses:          Dorsalis pedis pulses are 2+ on the right side and 2+ on the left side.  Pulmonary:     Comments: On ventilator Abdominal:     General: There is distension.     Palpations: Abdomen is soft.  Musculoskeletal:     Right lower leg: Edema present.     Comments: 2nd toe on right with wet gangrene, and sloughing skin, right lower leg with cellulitis and epidermal necrosis/ blistering, new from previous per the RNs report  Skin:    General: Skin is warm.  Neurological:     Comments: 3T, does not withdrawal or respond to pain           Results: Results for orders placed or performed during the hospital encounter of 01/26/20 (from the past 48 hour(s))  Glucose, capillary     Status: Abnormal   Collection Time: 02/03/20  4:18 PM  Result Value Ref Range   Glucose-Capillary 140 (H) 70 -  99 mg/dL    Comment: Glucose reference range applies only to samples taken after fasting for at least 8 hours.  Glucose, capillary     Status: Abnormal   Collection Time: 02/03/20  8:17 PM  Result Value Ref Range   Glucose-Capillary 123 (H) 70 - 99 mg/dL    Comment: Glucose reference range applies only to samples taken after fasting for at least 8 hours.  Glucose, capillary     Status: Abnormal   Collection Time: 02/04/20  1:21 AM  Result Value Ref Range   Glucose-Capillary 107 (H) 70 - 99 mg/dL    Comment: Glucose reference range applies only to samples taken after fasting for at least 8 hours.  Glucose, capillary     Status: Abnormal   Collection Time: 02/04/20  4:54 AM  Result Value Ref Range   Glucose-Capillary 120 (H) 70 - 99 mg/dL    Comment: Glucose reference range applies only to samples taken after fasting for at least 8 hours.  Heparin level (unfractionated)     Status: None    Collection Time: 02/04/20  8:14 AM  Result Value Ref Range   Heparin Unfractionated 0.46 0.30 - 0.70 IU/mL    Comment: (NOTE) If heparin results are below expected values, and patient dosage has  been confirmed, suggest follow up testing of antithrombin III levels. Performed at [facility], [address], [place], [place] [address]   Basic metabolic panel     Status: Abnormal   Collection Time: 02/04/20  8:14 AM  Result Value Ref Range   Sodium 140 135 - 145 mmol/L   Potassium 4.2 3.5 - 5.1 mmol/L   Chloride 90 (L) 98 - 111 mmol/L   CO2 38 (H) 22 - 32 mmol/L   Glucose, Bld 114 (H) 70 - 99 mg/dL    Comment: Glucose reference range applies only to samples taken after fasting for at least 8 hours.   BUN 27 (H) 8 - 23 mg/dL   Creatinine, Ser 0.42 (L) 0.44 - 1.00 mg/dL   Calcium 9.0 8.9 - 10.3 mg/dL   GFR calc non Af Amer >60 >60 mL/min   GFR calc Af Amer >60 >60 mL/min   Anion gap 12 5 - 15    Comment: Performed at [facility], [address], [place], [place] [address]  Magnesium     Status: None   Collection Time: 02/04/20  8:14 AM  Result Value Ref Range   Magnesium 2.2 1.7 - 2.4 mg/dL    Comment: Performed at [facility], [address] [place], [place] [address]  CBC     Status: Abnormal   Collection Time: 02/04/20  8:14 AM  Result Value Ref Range   WBC 9.1 4.0 - 10.5 K/uL   RBC 4.29 3.87 - 5.11 MIL/uL   Hemoglobin 12.2 12.0 - 15.0 g/dL   HCT 42.6 36 - 46 %   MCV 99.3 80.0 - 100.0 fL   MCH 28.4 26.0 - 34.0 pg   MCHC 28.6 (L) 30.0 - 36.0 g/dL   RDW 15.5 11.5 - 15.5 %   Platelets 187 150 - 400 K/uL   nRBC 0.0 0.0 - 0.2 %    Comment: Performed at [facility], [address] [place], [place] [address]  Glucose, capillary     Status: Abnormal   Collection Time: 02/04/20  8:59 AM  Result Value Ref Range   Glucose-Capillary 119 (H) 70 - 99 mg/dL    Comment: Glucose reference range applies only to  samples taken after fasting for at least 8 hours.  Glucose, capillary      Status: Abnormal   Collection Time: 02/04/20 11:36 AM  Result Value Ref Range   Glucose-Capillary 144 (H) 70 - 99 mg/dL    Comment: Glucose reference range applies only to samples taken after fasting for at least 8 hours.  Glucose, capillary     Status: Abnormal   Collection Time: 02/04/20  5:17 PM  Result Value Ref Range   Glucose-Capillary 105 (H) 70 - 99 mg/dL    Comment: Glucose reference range applies only to samples taken after fasting for at least 8 hours.  Glucose, capillary     Status: None   Collection Time: 02/04/20  7:46 PM  Result Value Ref Range   Glucose-Capillary 99 70 - 99 mg/dL    Comment: Glucose reference range applies only to samples taken after fasting for at least 8 hours.  Glucose, capillary     Status: Abnormal   Collection Time: 02/04/20 11:48 PM  Result Value Ref Range   Glucose-Capillary 101 (H) 70 - 99 mg/dL    Comment: Glucose reference range applies only to samples taken after fasting for at least 8 hours.  Basic metabolic panel     Status: Abnormal   Collection Time: 02/05/20  4:24 AM  Result Value Ref Range   Sodium 136 135 - 145 mmol/L   Potassium 4.9 3.5 - 5.1 mmol/L   Chloride 90 (L) 98 - 111 mmol/L   CO2 35 (H) 22 - 32 mmol/L   Glucose, Bld 130 (H) 70 - 99 mg/dL    Comment: Glucose reference range applies only to samples taken after fasting for at least 8 hours.   BUN 23 8 - 23 mg/dL   Creatinine, Ser 0.34 (L) 0.44 - 1.00 mg/dL   Calcium 8.9 8.9 - 10.3 mg/dL   GFR calc non Af Amer >60 >60 mL/min   GFR calc Af Amer >60 >60 mL/min   Anion gap 11 5 - 15    Comment: Performed at [facility], [address] [place], [place] [address]  Magnesium     Status: None   Collection Time: 02/05/20  4:24 AM  Result Value Ref Range   Magnesium 1.9 1.7 - 2.4 mg/dL    Comment: Performed at [facility], [address] [place], [place] [address]  CBC     Status: Abnormal   Collection Time: 02/05/20  4:24 AM  Result Value Ref Range   WBC 14.0 (H) 4.0 -  10.5 K/uL   RBC 4.51 3.87 - 5.11 MIL/uL   Hemoglobin 12.5 12.0 - 15.0 g/dL   HCT 43.2 36 - 46 %   MCV 95.8 80.0 - 100.0 fL   MCH 27.7 26.0 - 34.0 pg   MCHC 28.9 (L) 30.0 - 36.0 g/dL   RDW 15.0 11.5 - 15.5 %   Platelets 211 150 - 400 K/uL   nRBC 0.0 0.0 - 0.2 %    Comment: Performed at [facility], [address], [place], [place] [address]  Glucose, capillary     Status: Abnormal   Collection Time: 02/05/20  4:40 AM  Result Value Ref Range   Glucose-Capillary 110 (H) 70 - 99 mg/dL    Comment: Glucose reference range applies only to samples taken after fasting for at least 8 hours.  Heparin level (unfractionated)     Status: None   Collection Time: 02/05/20  7:25 AM  Result Value Ref Range   Heparin  Unfractionated 0.34 0.30 - 0.70 IU/mL    Comment: (NOTE) If heparin results are below expected values, and patient dosage has  been confirmed, suggest follow up testing of antithrombin III levels. Performed at [facility], [address] [place], [place] [address]   Glucose, capillary     Status: Abnormal   Collection Time: 02/05/20  8:52 AM  Result Value Ref Range   Glucose-Capillary 100 (H) 70 - 99 mg/dL    Comment: Glucose reference range applies only to samples taken after fasting for at least 8 hours.  Glucose, capillary     Status: Abnormal   Collection Time: 02/05/20 12:25 PM  Result Value Ref Range   Glucose-Capillary 108 (H) 70 - 99 mg/dL    Comment: Glucose reference range applies only to samples taken after fasting for at least 8 hours.   CT - right Tib/fib - reviewed and soft tissue swelling some swelling along muscle and between muscles, no gas, reveiwed with Dr. Thornton Papas too  DG CHEST PORT 1 VIEW  Result Date: 02/05/2020 CLINICAL DATA:  ICU patient with negative COVID test, currently intubated EXAM: PORTABLE CHEST 1 VIEW COMPARISON:  02/05/2020 FINDINGS: Endotracheal tube projects between clavicular heads approximately 5.4 cm above the carina. Gastric tube terminating in  the upper abdomen the area of the proximal stomach. Cardiomediastinal contours are enlarged accentuated by portable technique and rotation to the RIGHT. The engorgement of central pulmonary vascular structures, a RIGHT heart border obscured by RIGHT lower lobe airspace disease and effusion. Background interstitial markings increased. No lobar consolidation or sign of effusion on the LEFT. Evidence of prior trauma to the LEFT lateral ribs as before.  The IMPRESSION: 1. Findings of RIGHT-sided effusion and basilar collapse/consolidation with worsening over a series of prior studies is similar to the most recent exam. 2. Endotracheal tube in place, tip between clavicular heads. 3. Background interstitial prominence may represent pneumonitis or edema similar to the prior study. Electronically Signed   By: Zetta Bills M.D.   On: 02/05/2020 09:53   DG Chest Port 1 View  Result Date: 02/05/2020 CLINICAL DATA:  Check endotracheal tube placement EXAM: PORTABLE CHEST 1 VIEW COMPARISON:  02/04/2020 FINDINGS: Cardiac shadow is stable. Endotracheal tube and gastric catheter are seen and stable. Increasing right-sided pleural effusion and likely underlying infiltrate is noted. Left lung remains clear. No bony abnormality is noted. IMPRESSION: Increasing right-sided pleural effusion and likely underlying infiltrate. Electronically Signed   By: Inez Catalina M.D.   On: 02/05/2020 06:50   DG Chest Port 1 View  Result Date: 02/04/2020 CLINICAL DATA:  Intubation EXAM: PORTABLE CHEST 1 VIEW COMPARISON:  Two days ago FINDINGS: Endotracheal tube with tip at the clavicular heads. The enteric tube reaches the stomach. Worsening aeration at the right base. Cardiomegaly. Remote left rib fracture. IMPRESSION: Worsening aeration at the right base which could be pneumonia or atelectasis. Electronically Signed   By: Monte Fantasia M.D.   On: 02/04/2020 07:27    Assessment & Plan:  [name] [name] is a 64 y.o. female with a  necrotic wet gangrenous 2nd toe on the right and concerning worsening cellulitis with blistering and some epidermal necrosis that could be from swelling but could be a necrotizing infection. This is the first I am seeing it so it is hard to say. She is not responsive to stimuli.  -Discussed the patient's case for a long time with daughter [name] and discussed that her leg is worsening and her 2nd toe is definitive necrotic and  needs amputated. We discussed further imaging of the leg and plan for amputation of the toe tomorrow, but I warned her the leg is looking worse and she may need a higher amputation which will probably be above the knee. -Discussed with the daughter that the patient is not progressing and that she will likely need a tracheostomy as she is not weaning on the vent. Discussed code status and goals of care. -Discussed longterm/ permanent SNF placement as a high likelihood if she survives  -Discussed risk of amputations including bleeding, worsening infection, needing further amputation.  -Right now the daughter wants everything done. -Recommend Palliative care consult to help with goals of care as this patient has multiple system organ failure and is not improving.  -Discussion with Dr. Nita Sickle Radiology regarding best plan for evaluation, and given intubation unable to do MRI here. MRI would give most information about necrotizing infection, but will have to do CT with contrast to assess.   All questions were answered to the satisfaction of the family.  Greater than 50% of the 110 minute visit was spent in counseling/ coordination of care regarding the patient's current state and discussion with the daughter and team about the goals of care, code status, and overall prognosis. We discussed SNF placement discussed the palliative care consult. Plan for CT now and may have to transfer for MRI. May have to amputate more than toe.     Virl Cagey 02/05/2020, 12:52 PM

## 2020-02-05 NOTE — Progress Notes (Signed)
[facility]  Spoke with patient's daughter [name] again in person with Dr. Wynetta Emery. Discussed code status and has made DNR. Discussed concerns again about worsening infection, possible necrotizing infection and need for more than toe amp and possible above knee amp. She understands the severity and understands issues with weaning from vent. She says her mom did not want to be on machines.   Discussed for over 20 minutes.  We are getting CT now. Unable to get MRI at [facility] for intubated patient as we do not have transport available or staff /RN to go with the patient to get MRI. Will have to clinically watch leg pending CT and decide in AM about whether amputation above knee is needed.   Curlene Labrum, MD [facility] [address] [place], [place] [address] [phone] (office)

## 2020-02-05 NOTE — Consult Note (Addendum)
[facility] Consult  Reason for Consult: Right 2nd toe infection; right leg cellulitis  Referring Physician:  Dr. Wynetta Emery  Chief Complaint    Toe Pain; Code Sepsis      HPI: [name] [name] is a 64 y.o. female with COPD, HTN, DM, obesity and admission for cellulitis of the right lower extremity over 1 week ago who also reported drainage from the right second toe. She had been having fevers and body aches. She was found to have a fever on arrival and was admitted for IV antibiotics At that time she was also found to have AKI and was septic. She was admitted to the floor but soon developed some aspiration and was intubated and transferred to the ICU. She now has been found to be bacteremic with Group B strep presumably from her infected toe and has a right sided consolidation/ PNA and worsening effusion. She has not been able to wean on the ventilator per report from the RN.  A CT was done of the Right tib/fib on 9/11 that demonstrated some signs of cellulitis and myositis but the toe was not included in this evaluation. Xrays of the foot did not indicate any concern for osteo per H&P.   Over the last day the RNs says her right lower extremity redness and swelling has worsened. There are also blisters on this now. She has been on heparin gtt for A fib. She has been febrile and is sedated and not really responsive.   I called her daughter, [name] to discuss the patient's current status and prognosis.    Past Medical History:  Diagnosis Date  . Anxiety   . Bipolar 1 disorder ([place])   . Cancer ([place])    colon  . Chronic diarrhea    Followed by Dr Michail Sermon   . Complication of anesthesia    Hx resp distress during surgery  . COPD (chronic obstructive pulmonary disease) ([place])   . Depression   . Diabetes mellitus ([place])   . External hemorrhoids   . GI bleed   . Headache(784.0)   . Hyperlipidemia   . Hypertension   . Hypothyroidism   . Insomnia   . Internal hemorrhoids   .  Polysubstance abuse ([place]) Quit 2008    H/O methamphetamine and alcohol abuse, clean x 6 years  . Shortness of breath   . Suicidal thoughts   . Tobacco abuse   . Tubular adenoma of colon     Past Surgical History:  Procedure Laterality Date  . ABDOMINAL HYSTERECTOMY    . APPENDECTOMY    . COLON SURGERY     "colon repair"  . COLONOSCOPY WITH PROPOFOL N/A 12/11/2012   Procedure: COLONOSCOPY WITH PROPOFOL;  Surgeon: Jerene Bears, MD;  Location: [facility];  Service: Gastroenterology;  Laterality: N/A;  . FINGER SURGERY     middle left finger  . HERNIA REPAIR     x 2  . KNEE SURGERY Left    Left knee tendon surgery  . TONSILLECTOMY    . TUBAL LIGATION      Family History  Problem Relation Age of Onset  . Multiple sclerosis Mother   . Alcoholism Father   . Depression Brother   . Emphysema Maternal Grandfather        smoked  . Breast cancer Neg Hx   . Colon cancer Neg Hx   . Esophageal cancer Neg Hx   . Colon polyps Neg Hx   . Stomach cancer Neg Hx   .  Pancreatic cancer Neg Hx   . Liver disease Neg Hx     Social History   Tobacco Use  . Smoking status: Current Every Day Smoker    Packs/day: 1.00    Years: 42.00    Pack years: 42.00    Types: Cigarettes  . Smokeless tobacco: Never Used  Substance Use Topics  . Alcohol use: Yes    Comment: occ  . Drug use: No    Medications: I have reviewed the patient's current medications. Current Facility-Administered Medications  Medication Dose Route Frequency Provider Last Rate Last Admin  . 0.9 %  sodium chloride infusion   Intravenous PRN Barton Dubois, MD 10 mL/hr at 01/29/20 1401 Restarted at 01/29/20 1401  . acetaminophen (TYLENOL) tablet 650 mg  650 mg Per Tube Q6H PRN Chesley Mires, MD       Or  . acetaminophen (TYLENOL) suppository 650 mg  650 mg Rectal Q6H PRN Chesley Mires, MD      . acetylcysteine (MUCOMYST) 20 % nebulizer / oral solution 2 mL  2 mL Nebulization BID Chesley Mires, MD   2 mL at 02/05/20 0854  .  arformoterol (BROVANA) nebulizer solution 15 mcg  15 mcg Nebulization BID Barton Dubois, MD   15 mcg at 02/05/20 0908  . benztropine (COGENTIN) tablet 0.5 mg  0.5 mg Per Tube BID Lang Snow, FNP   0.5 mg at 02/05/20 0957  . budesonide (PULMICORT) nebulizer solution 0.5 mg  0.5 mg Nebulization BID Barton Dubois, MD   0.5 mg at 02/05/20 0905  . ceFAZolin (ANCEF) IVPB 2g/100 mL premix  2 g Intravenous Q8H Johnson, Clanford L, MD      . chlorhexidine gluconate (MEDLINE KIT) (PERIDEX) 0.12 % solution 15 mL  15 mL Mouth Rinse BID Barton Dubois, MD   15 mL at 02/05/20 0818  . Chlorhexidine Gluconate Cloth 2 % PADS 6 each  6 each Topical Daily Barton Dubois, MD   6 each at 02/05/20 [phone]  . Chlorhexidine Gluconate Cloth 2 % PADS 6 each  6 each Topical Once Virl Cagey, MD      . clindamycin (CLEOCIN) IVPB 600 mg  600 mg Intravenous Q8H Shah, Pratik D, DO 100 mL/hr at 02/05/20 1435 Rate Verify at 02/05/20 1435  . cloNIDine (CATAPRES) tablet 0.2 mg  0.2 mg Per Tube TID Tanda Rockers, MD   0.2 mg at 02/05/20 0957  . diltiazem (CARDIZEM) tablet 60 mg  60 mg Per Tube Q6H Arnoldo Lenis, MD   60 mg at 02/05/20 0556  . docusate (COLACE) 50 MG/5ML liquid 100 mg  100 mg Per Tube BID Lang Snow, FNP   100 mg at 02/05/20 0957  . feeding supplement (VITAL HIGH PROTEIN) liquid 1,000 mL  1,000 mL Per Tube Q24H Manuella Ghazi, Pratik D, DO   1,000 mL at 02/04/20 1223  . fentaNYL 2564mg in NS 2525m(1053mml) infusion-PREMIX  0-400 mcg/hr Intravenous Continuous ShaHeath Lark DO 15 mL/hr at 02/05/20 1435 150 mcg/hr at 02/05/20 1435  . folic acid (FOLVITE) tablet 1 mg  1 mg Per Tube Daily DenLang SnowNP   1 mg at 02/05/20 0957  . heparin ADULT infusion 100 units/mL (25000 units/250m2mdium chloride 0.45%)  2,250 Units/hr Intravenous Continuous Johnson, Clanford L, MD 22.5 mL/hr at 02/05/20 1435 2,250 Units/hr at 02/05/20 1435  . insulin aspart (novoLOG) injection 0-9 Units  0-9 Units Subcutaneous Q4H  MadeBarton Dubois   1 Units at 02/04/20 1230  . labetalol (NORMODYNE) injection 10  mg  10 mg Intravenous Q2H PRN Manuella Ghazi, Pratik D, DO      . levalbuterol (XOPENEX) nebulizer solution 0.63 mg  0.63 mg Nebulization Q6H PRN Chesley Mires, MD   0.63 mg at 02/05/20 0854  . MEDLINE mouth rinse  15 mL Mouth Rinse 10 times per day Barton Dubois, MD   15 mL at 02/05/20 1435  . midazolam (VERSED) 50 mg/50 mL (1 mg/mL) premix infusion  0.5-10 mg/hr Intravenous Continuous Barton Dubois, MD 9 mL/hr at 02/05/20 1435 9 mg/hr at 02/05/20 1435  . nicotine (NICODERM CQ - dosed in mg/24 hr) patch 7 mg  7 mg Transdermal Daily Barton Dubois, MD   7 mg at 02/05/20 0956  . ondansetron (ZOFRAN) tablet 4 mg  4 mg Oral Q6H PRN Barton Dubois, MD       Or  . ondansetron [facility]) injection 4 mg  4 mg Intravenous Q6H PRN Barton Dubois, MD   4 mg at 02/05/20 0816  . pantoprazole sodium (PROTONIX) 40 mg/20 mL oral suspension 40 mg  40 mg Per Tube Q24H Chesley Mires, MD   40 mg at 02/05/20 1434  . polyethylene glycol (MIRALAX / GLYCOLAX) packet 17 g  17 g Oral Daily Barton Dubois, MD   17 g at 02/05/20 0957  . thiamine tablet 100 mg  100 mg Oral Daily Barton Dubois, MD   100 mg at 02/04/20 0910   Or  . thiamine (B-1) injection 100 mg  100 mg Intravenous Daily Barton Dubois, MD   100 mg at 02/05/20 0955  . valproic acid (DEPAKENE) 250 MG/5ML solution 500 mg  500 mg Per Tube BID Barton Dubois, MD   500 mg at 02/05/20 1552    Allergies  Allergen Reactions  . Bactrim [Sulfamethoxazole-Trimethoprim] Anaphylaxis  . Amoxicillin Hives  . Diclofenac Tinitus    swelling in feet  . Morphine And Related Nausea And Vomiting and Other (See Comments)    headache  . Potassium-Containing Compounds Itching     ROS:  Review of systems not obtained due to patient factors.  Blood pressure (!) 119/47, pulse 94, temperature 98.3 F (36.8 C), temperature source Oral, resp. rate 18, height _0  (1.651 m), weight 104.8 kg, SpO2 95  %. Physical Exam Vitals reviewed.  Constitutional:      Comments: sedated  HENT:     Head: Atraumatic.     Nose: Nose normal.  Cardiovascular:     Rate and Rhythm: Normal rate.     Pulses:          Dorsalis pedis pulses are 2+ on the right side and 2+ on the left side.  Pulmonary:     Comments: On ventilator Abdominal:     General: There is distension.     Palpations: Abdomen is soft.  Musculoskeletal:     Right lower leg: Edema present.     Comments: 2nd toe on right with wet gangrene, and sloughing skin, right lower leg with cellulitis and epidermal necrosis/ blistering, new from previous per the RNs report  Skin:    General: Skin is warm.  Neurological:     Comments: 3T, does not withdrawal or respond to pain           Results: Results for orders placed or performed during the hospital encounter of 01/26/20 (from the past 48 hour(s))  Glucose, capillary     Status: Abnormal   Collection Time: 02/03/20  4:18 PM  Result Value Ref Range   Glucose-Capillary 140 (H) 70 -  99 mg/dL    Comment: Glucose reference range applies only to samples taken after fasting for at least 8 hours.  Glucose, capillary     Status: Abnormal   Collection Time: 02/03/20  8:17 PM  Result Value Ref Range   Glucose-Capillary 123 (H) 70 - 99 mg/dL    Comment: Glucose reference range applies only to samples taken after fasting for at least 8 hours.  Glucose, capillary     Status: Abnormal   Collection Time: 02/04/20  1:21 AM  Result Value Ref Range   Glucose-Capillary 107 (H) 70 - 99 mg/dL    Comment: Glucose reference range applies only to samples taken after fasting for at least 8 hours.  Glucose, capillary     Status: Abnormal   Collection Time: 02/04/20  4:54 AM  Result Value Ref Range   Glucose-Capillary 120 (H) 70 - 99 mg/dL    Comment: Glucose reference range applies only to samples taken after fasting for at least 8 hours.  Heparin level (unfractionated)     Status: None    Collection Time: 02/04/20  8:14 AM  Result Value Ref Range   Heparin Unfractionated 0.46 0.30 - 0.70 IU/mL    Comment: (NOTE) If heparin results are below expected values, and patient dosage has  been confirmed, suggest follow up testing of antithrombin III levels. Performed at [facility], [address], [place], [place] [address]   Basic metabolic panel     Status: Abnormal   Collection Time: 02/04/20  8:14 AM  Result Value Ref Range   Sodium 140 135 - 145 mmol/L   Potassium 4.2 3.5 - 5.1 mmol/L   Chloride 90 (L) 98 - 111 mmol/L   CO2 38 (H) 22 - 32 mmol/L   Glucose, Bld 114 (H) 70 - 99 mg/dL    Comment: Glucose reference range applies only to samples taken after fasting for at least 8 hours.   BUN 27 (H) 8 - 23 mg/dL   Creatinine, Ser 0.42 (L) 0.44 - 1.00 mg/dL   Calcium 9.0 8.9 - 10.3 mg/dL   GFR calc non Af Amer >60 >60 mL/min   GFR calc Af Amer >60 >60 mL/min   Anion gap 12 5 - 15    Comment: Performed at [facility], [address], [place], [place] [address]  Magnesium     Status: None   Collection Time: 02/04/20  8:14 AM  Result Value Ref Range   Magnesium 2.2 1.7 - 2.4 mg/dL    Comment: Performed at [facility], [address] [place], [place] [address]  CBC     Status: Abnormal   Collection Time: 02/04/20  8:14 AM  Result Value Ref Range   WBC 9.1 4.0 - 10.5 K/uL   RBC 4.29 3.87 - 5.11 MIL/uL   Hemoglobin 12.2 12.0 - 15.0 g/dL   HCT 42.6 36 - 46 %   MCV 99.3 80.0 - 100.0 fL   MCH 28.4 26.0 - 34.0 pg   MCHC 28.6 (L) 30.0 - 36.0 g/dL   RDW 15.5 11.5 - 15.5 %   Platelets 187 150 - 400 K/uL   nRBC 0.0 0.0 - 0.2 %    Comment: Performed at [facility], [address] [place],  [address]  Glucose, capillary     Status: Abnormal   Collection Time: 02/04/20  8:59 AM  Result Value Ref Range   Glucose-Capillary 119 (H) 70 - 99 mg/dL    Comment: Glucose reference range applies only to  samples taken after fasting for at least 8 hours.  Glucose, capillary      Status: Abnormal   Collection Time: 02/04/20 11:36 AM  Result Value Ref Range   Glucose-Capillary 144 (H) 70 - 99 mg/dL    Comment: Glucose reference range applies only to samples taken after fasting for at least 8 hours.  Glucose, capillary     Status: Abnormal   Collection Time: 02/04/20  5:17 PM  Result Value Ref Range   Glucose-Capillary 105 (H) 70 - 99 mg/dL    Comment: Glucose reference range applies only to samples taken after fasting for at least 8 hours.  Glucose, capillary     Status: None   Collection Time: 02/04/20  7:46 PM  Result Value Ref Range   Glucose-Capillary 99 70 - 99 mg/dL    Comment: Glucose reference range applies only to samples taken after fasting for at least 8 hours.  Glucose, capillary     Status: Abnormal   Collection Time: 02/04/20 11:48 PM  Result Value Ref Range   Glucose-Capillary 101 (H) 70 - 99 mg/dL    Comment: Glucose reference range applies only to samples taken after fasting for at least 8 hours.  Basic metabolic panel     Status: Abnormal   Collection Time: 02/05/20  4:24 AM  Result Value Ref Range   Sodium 136 135 - 145 mmol/L   Potassium 4.9 3.5 - 5.1 mmol/L   Chloride 90 (L) 98 - 111 mmol/L   CO2 35 (H) 22 - 32 mmol/L   Glucose, Bld 130 (H) 70 - 99 mg/dL    Comment: Glucose reference range applies only to samples taken after fasting for at least 8 hours.   BUN 23 8 - 23 mg/dL   Creatinine, Ser 0.34 (L) 0.44 - 1.00 mg/dL   Calcium 8.9 8.9 - 10.3 mg/dL   GFR calc non Af Amer >60 >60 mL/min   GFR calc Af Amer >60 >60 mL/min   Anion gap 11 5 - 15    Comment: Performed at [facility], [address] [place], [place] [address]  Magnesium     Status: None   Collection Time: 02/05/20  4:24 AM  Result Value Ref Range   Magnesium 1.9 1.7 - 2.4 mg/dL    Comment: Performed at [facility], [address] [place], [place] [address]  CBC     Status: Abnormal   Collection Time: 02/05/20  4:24 AM  Result Value Ref Range   WBC 14.0 (H) 4.0 -  10.5 K/uL   RBC 4.51 3.87 - 5.11 MIL/uL   Hemoglobin 12.5 12.0 - 15.0 g/dL   HCT 43.2 36 - 46 %   MCV 95.8 80.0 - 100.0 fL   MCH 27.7 26.0 - 34.0 pg   MCHC 28.9 (L) 30.0 - 36.0 g/dL   RDW 15.0 11.5 - 15.5 %   Platelets 211 150 - 400 K/uL   nRBC 0.0 0.0 - 0.2 %    Comment: Performed at [facility], [address], [place], [place] [address]  Glucose, capillary     Status: Abnormal   Collection Time: 02/05/20  4:40 AM  Result Value Ref Range   Glucose-Capillary 110 (H) 70 - 99 mg/dL    Comment: Glucose reference range applies only to samples taken after fasting for at least 8 hours.  Heparin level (unfractionated)     Status: None   Collection Time: 02/05/20  7:25 AM  Result Value Ref Range   Heparin  Unfractionated 0.34 0.30 - 0.70 IU/mL    Comment: (NOTE) If heparin results are below expected values, and patient dosage has  been confirmed, suggest follow up testing of antithrombin III levels. Performed at [facility], [address] [place], [place] [address]   Glucose, capillary     Status: Abnormal   Collection Time: 02/05/20  8:52 AM  Result Value Ref Range   Glucose-Capillary 100 (H) 70 - 99 mg/dL    Comment: Glucose reference range applies only to samples taken after fasting for at least 8 hours.  Glucose, capillary     Status: Abnormal   Collection Time: 02/05/20 12:25 PM  Result Value Ref Range   Glucose-Capillary 108 (H) 70 - 99 mg/dL    Comment: Glucose reference range applies only to samples taken after fasting for at least 8 hours.   CT - right Tib/fib - reviewed and soft tissue swelling some swelling along muscle and between muscles, no gas, reveiwed with Dr. Thornton Papas too  DG CHEST PORT 1 VIEW  Result Date: 02/05/2020 CLINICAL DATA:  ICU patient with negative COVID test, currently intubated EXAM: PORTABLE CHEST 1 VIEW COMPARISON:  02/05/2020 FINDINGS: Endotracheal tube projects between clavicular heads approximately 5.4 cm above the carina. Gastric tube terminating in  the upper abdomen the area of the proximal stomach. Cardiomediastinal contours are enlarged accentuated by portable technique and rotation to the RIGHT. The engorgement of central pulmonary vascular structures, a RIGHT heart border obscured by RIGHT lower lobe airspace disease and effusion. Background interstitial markings increased. No lobar consolidation or sign of effusion on the LEFT. Evidence of prior trauma to the LEFT lateral ribs as before.  The IMPRESSION: 1. Findings of RIGHT-sided effusion and basilar collapse/consolidation with worsening over a series of prior studies is similar to the most recent exam. 2. Endotracheal tube in place, tip between clavicular heads. 3. Background interstitial prominence may represent pneumonitis or edema similar to the prior study. Electronically Signed   By: Zetta Bills M.D.   On: 02/05/2020 09:53   DG Chest Port 1 View  Result Date: 02/05/2020 CLINICAL DATA:  Check endotracheal tube placement EXAM: PORTABLE CHEST 1 VIEW COMPARISON:  02/04/2020 FINDINGS: Cardiac shadow is stable. Endotracheal tube and gastric catheter are seen and stable. Increasing right-sided pleural effusion and likely underlying infiltrate is noted. Left lung remains clear. No bony abnormality is noted. IMPRESSION: Increasing right-sided pleural effusion and likely underlying infiltrate. Electronically Signed   By: Inez Catalina M.D.   On: 02/05/2020 06:50   DG Chest Port 1 View  Result Date: 02/04/2020 CLINICAL DATA:  Intubation EXAM: PORTABLE CHEST 1 VIEW COMPARISON:  Two days ago FINDINGS: Endotracheal tube with tip at the clavicular heads. The enteric tube reaches the stomach. Worsening aeration at the right base. Cardiomegaly. Remote left rib fracture. IMPRESSION: Worsening aeration at the right base which could be pneumonia or atelectasis. Electronically Signed   By: Monte Fantasia M.D.   On: 02/04/2020 07:27    Assessment & Plan:  [name] [name] is a 64 y.o. female with a  necrotic wet gangrenous 2nd toe on the right and concerning worsening cellulitis with blistering and some epidermal necrosis that could be from swelling but could be a necrotizing infection. This is the first I am seeing it so it is hard to say. She is not responsive to stimuli.  -Discussed the patient's case for a long time with daughter [name] and discussed that her leg is worsening and her 2nd toe is definitive necrotic and  needs amputated. We discussed further imaging of the leg and plan for amputation of the toe tomorrow, but I warned her the leg is looking worse and she may need a higher amputation which will probably be above the knee. -Discussed with the daughter that the patient is not progressing and that she will likely need a tracheostomy as she is not weaning on the vent. Discussed code status and goals of care. -Discussed longterm/ permanent SNF placement as a high likelihood if she survives  -Discussed risk of amputations including bleeding, worsening infection, needing further amputation.  -Right now the daughter wants everything done. -Recommend Palliative care consult to help with goals of care as this patient has multiple system organ failure and is not improving.  -Discussion with Dr. Nita Sickle Radiology regarding best plan for evaluation, and given intubation unable to do MRI here. MRI would give most information about necrotizing infection, but will have to do CT with contrast to assess.   All questions were answered to the satisfaction of the family.  Greater than 50% of the 110 minute visit was spent in counseling/ coordination of care regarding the patient's current state and discussion with the daughter and team about the goals of care, code status, and overall prognosis. We discussed SNF placement discussed the palliative care consult. Plan for CT now and may have to transfer for MRI. May have to amputate more than toe.     Virl Cagey 02/05/2020, 12:52 PM

## 2020-02-05 NOTE — Progress Notes (Signed)
ANTICOAGULATION CONSULT NOTE - [place] for Heparin Indication: atrial fibrillation  Allergies  Allergen Reactions  . Bactrim [Sulfamethoxazole-Trimethoprim] Anaphylaxis  . Amoxicillin Hives  . Diclofenac Tinitus    swelling in feet  . Morphine And Related Nausea And Vomiting and Other (See Comments)    headache  . Potassium-Containing Compounds Itching    Patient Measurements: Height: 5\' 5"  (165.1 cm) Weight: 104.8 kg (231 lb 0.7 oz) IBW/kg (Calculated) : 57 Heparin Dosing Weight:  80 kg  Vital Signs: Temp: 102 F (38.9 C) (09/15 0400) Temp Source: Rectal (09/15 0400) BP: 119/47 (09/15 0700) Pulse Rate: 101 (09/15 0700)  Labs: Recent Labs    02/03/20 0611 02/03/20 0611 02/04/20 0814 02/05/20 0424 02/05/20 0725  HGB 10.8*   < > 12.2 12.5  --   HCT 37.6  --  42.6 43.2  --   PLT 181  --  187 211  --   HEPARINUNFRC 0.32  --  0.46  --  0.34  CREATININE 0.36*  --  0.42* 0.34*  --    < > = values in this interval not displayed.    Estimated Creatinine Clearance: 86.5 mL/min (A) (by C-G formula based on SCr of 0.34 mg/dL (L)).  Assessment: Pharmacy was consulted to dose heparin in 64 yr old female with atrial fibrillation.  Patient was not on anticoagulation prior to admission.   Heparin level: 0.34- therapeutic CBC WNL   Goal of Therapy:  Heparin level 0.3-0.7 units/ml Monitor platelets by anticoagulation protocol: Yes   Plan:  Continue heparin infusion rate at 2250 units/hr  Monitor daily heparin level, CBC Monitor for signs/symptoms of bleeding  Margot Ables, PharmD Clinical Pharmacist 02/05/2020 8:42 AM

## 2020-02-06 ENCOUNTER — Inpatient Hospital Stay (HOSPITAL_COMMUNITY): Payer: Medicaid Other | Admitting: Certified Registered"

## 2020-02-06 ENCOUNTER — Inpatient Hospital Stay (HOSPITAL_COMMUNITY): Payer: Medicaid Other

## 2020-02-06 ENCOUNTER — Encounter (HOSPITAL_COMMUNITY)
Admission: EM | Disposition: A | Discharge status: Home Health | Payer: Self-pay | Source: Home / Self Care | Attending: Internal Medicine

## 2020-02-06 DIAGNOSIS — Z20822 Contact with and (suspected) exposure to covid-19: Secondary | ICD-10-CM | POA: Diagnosis not present

## 2020-02-06 DIAGNOSIS — I96 Gangrene, not elsewhere classified: Secondary | ICD-10-CM

## 2020-02-06 DIAGNOSIS — Z515 Encounter for palliative care: Secondary | ICD-10-CM | POA: Diagnosis not present

## 2020-02-06 DIAGNOSIS — Z7189 Other specified counseling: Secondary | ICD-10-CM

## 2020-02-06 DIAGNOSIS — J90 Pleural effusion, not elsewhere classified: Secondary | ICD-10-CM

## 2020-02-06 DIAGNOSIS — Z66 Do not resuscitate: Secondary | ICD-10-CM

## 2020-02-06 DIAGNOSIS — L089 Local infection of the skin and subcutaneous tissue, unspecified: Secondary | ICD-10-CM

## 2020-02-06 DIAGNOSIS — A401 Sepsis due to streptococcus, group B: Secondary | ICD-10-CM | POA: Diagnosis not present

## 2020-02-06 DIAGNOSIS — R233 Spontaneous ecchymoses: Secondary | ICD-10-CM

## 2020-02-06 DIAGNOSIS — Z978 Presence of other specified devices: Secondary | ICD-10-CM

## 2020-02-06 HISTORY — PX: WOUND DEBRIDEMENT: SHX247

## 2020-02-06 LAB — COMPREHENSIVE METABOLIC PANEL
ALT: 29 U/L (ref 0–44)
AST: 19 U/L (ref 15–41)
Albumin: 2.1 g/dL — ABNORMAL LOW (ref 3.5–5.0)
Alkaline Phosphatase: 131 U/L — ABNORMAL HIGH (ref 38–126)
Anion gap: 10 (ref 5–15)
BUN: 20 mg/dL (ref 8–23)
CO2: 35 mmol/L — ABNORMAL HIGH (ref 22–32)
Calcium: 8.6 mg/dL — ABNORMAL LOW (ref 8.9–10.3)
Chloride: 90 mmol/L — ABNORMAL LOW (ref 98–111)
Creatinine, Ser: 0.38 mg/dL — ABNORMAL LOW (ref 0.44–1.00)
GFR calc Af Amer: >60 mL/min (ref >60)
GFR calc non Af Amer: >60 mL/min (ref >60)
Glucose, Bld: 117 mg/dL — ABNORMAL HIGH (ref 70–99)
Potassium: 4.4 mmol/L (ref 3.5–5.1)
Sodium: 135 mmol/L (ref 135–145)
Total Bilirubin: 1.4 mg/dL — ABNORMAL HIGH (ref 0.3–1.2)
Total Protein: 6.3 g/dL — ABNORMAL LOW (ref 6.5–8.1)

## 2020-02-06 LAB — CBC WITH DIFFERENTIAL/PLATELET
Abs Immature Granulocytes: 0.3 10*3/uL — ABNORMAL HIGH (ref 0.00–0.07)
Basophils Absolute: 0.1 10*3/uL (ref 0.0–0.1)
Basophils Relative: 0 %
Eosinophils Absolute: 0.1 10*3/uL (ref 0.0–0.5)
Eosinophils Relative: 0 %
HCT: 35.4 % — ABNORMAL LOW (ref 36.0–46.0)
Hemoglobin: 10.2 g/dL — ABNORMAL LOW (ref 12.0–15.0)
Immature Granulocytes: 3 %
Lymphocytes Relative: 2 %
Lymphs Abs: 0.3 10*3/uL — ABNORMAL LOW (ref 0.7–4.0)
MCH: 28.2 pg (ref 26.0–34.0)
MCHC: 28.8 g/dL — ABNORMAL LOW (ref 30.0–36.0)
MCV: 97.8 fL (ref 80.0–100.0)
Monocytes Absolute: 0.4 10*3/uL (ref 0.1–1.0)
Monocytes Relative: 4 %
Neutro Abs: 10.8 10*3/uL — ABNORMAL HIGH (ref 1.7–7.7)
Neutrophils Relative %: 91 %
Platelets: 194 10*3/uL (ref 150–400)
RBC: 3.62 MIL/uL — ABNORMAL LOW (ref 3.87–5.11)
RDW: 14.8 % (ref 11.5–15.5)
WBC: 11.9 10*3/uL — ABNORMAL HIGH (ref 4.0–10.5)
nRBC: 0 % (ref 0.0–0.2)

## 2020-02-06 LAB — GLUCOSE, CAPILLARY
Glucose-Capillary: 106 mg/dL — ABNORMAL HIGH (ref 70–99)
Glucose-Capillary: 123 mg/dL — ABNORMAL HIGH (ref 70–99)
Glucose-Capillary: 130 mg/dL — ABNORMAL HIGH (ref 70–99)
Glucose-Capillary: 144 mg/dL — ABNORMAL HIGH (ref 70–99)
Glucose-Capillary: 147 mg/dL — ABNORMAL HIGH (ref 70–99)
Glucose-Capillary: 97 mg/dL (ref 70–99)

## 2020-02-06 LAB — HEPARIN LEVEL (UNFRACTIONATED): Heparin Unfractionated: 0.26 IU/mL — ABNORMAL LOW (ref 0.30–0.70)

## 2020-02-06 SURGERY — DEBRIDEMENT, WOUND
Anesthesia: General | Site: Leg Lower | Laterality: Right

## 2020-02-06 MED ORDER — DEXAMETHASONE SODIUM PHOSPHATE 10 MG/ML IJ SOLN
INTRAMUSCULAR | Status: AC
  Filled 2020-02-06: qty 1

## 2020-02-06 MED ORDER — VALPROATE SODIUM 250 MG/5ML PO SOLN
ORAL | Status: AC
  Filled 2020-02-06: qty 10

## 2020-02-06 MED ORDER — SODIUM CHLORIDE 0.9 % IR SOLN
Status: DC | PRN
  Administered 2020-02-06: 1000 mL

## 2020-02-06 MED ORDER — KETAMINE HCL 10 MG/ML IJ SOLN
INTRAMUSCULAR | Status: DC | PRN
  Administered 2020-02-06: 20 mg via INTRAVENOUS
  Administered 2020-02-06: 30 mg via INTRAVENOUS

## 2020-02-06 MED ORDER — FENTANYL CITRATE (PF) 100 MCG/2ML IJ SOLN
INTRAMUSCULAR | Status: AC
  Filled 2020-02-06: qty 2

## 2020-02-06 MED ORDER — KETAMINE HCL 50 MG/5ML IJ SOSY
PREFILLED_SYRINGE | INTRAMUSCULAR | Status: AC
  Filled 2020-02-06: qty 5

## 2020-02-06 MED ORDER — DEXAMETHASONE SODIUM PHOSPHATE 10 MG/ML IJ SOLN
INTRAMUSCULAR | Status: DC | PRN
  Administered 2020-02-06: 10 mg via INTRAVENOUS

## 2020-02-06 MED ORDER — LACTATED RINGERS IV SOLN: INTRAVENOUS | Status: DC | PRN

## 2020-02-06 MED ORDER — HEMOSTATIC AGENTS (NO CHARGE) OPTIME
TOPICAL | Status: DC | PRN
  Administered 2020-02-06: 1 via TOPICAL

## 2020-02-06 MED ORDER — ONDANSETRON HCL 4 MG/2ML IJ SOLN
INTRAMUSCULAR | Status: DC | PRN
  Administered 2020-02-06: 4 mg via INTRAVENOUS

## 2020-02-06 MED ORDER — ROCURONIUM BROMIDE 100 MG/10ML IV SOLN
INTRAVENOUS | Status: DC | PRN
  Administered 2020-02-06: 50 mg via INTRAVENOUS

## 2020-02-06 MED ORDER — FENTANYL CITRATE (PF) 100 MCG/2ML IJ SOLN
INTRAMUSCULAR | Status: DC | PRN
  Administered 2020-02-06 (×2): 50 ug via INTRAVENOUS

## 2020-02-06 MED ORDER — BACITRACIN ZINC 500 UNIT/GM EX OINT
TOPICAL_OINTMENT | CUTANEOUS | Status: AC
  Filled 2020-02-06: qty 0.9

## 2020-02-06 MED ORDER — ROCURONIUM BROMIDE 10 MG/ML (PF) SYRINGE
PREFILLED_SYRINGE | INTRAVENOUS | Status: AC
  Filled 2020-02-06: qty 10

## 2020-02-06 MED ORDER — HEPARIN (PORCINE) 25000 UT/250ML-% IV SOLN
2500.0000 [IU]/h | INTRAVENOUS | Status: DC
  Administered 2020-02-06 – 2020-02-07 (×3): 2350 [IU]/h via INTRAVENOUS
  Administered 2020-02-08 – 2020-02-09 (×4): 2450 [IU]/h via INTRAVENOUS
  Administered 2020-02-10: 2500 [IU]/h via INTRAVENOUS
  Administered 2020-02-10: 2450 [IU]/h via INTRAVENOUS
  Administered 2020-02-11 – 2020-02-12 (×4): 2500 [IU]/h via INTRAVENOUS
  Filled 2020-02-06 (×13): qty 250

## 2020-02-06 MED ORDER — BACITRACIN-NEOMYCIN-POLYMYXIN 400-5-5000 EX OINT
TOPICAL_OINTMENT | CUTANEOUS | Status: DC | PRN
  Administered 2020-02-06: 6 via TOPICAL

## 2020-02-06 MED ORDER — HEPARIN (PORCINE) 25000 UT/250ML-% IV SOLN
2250.0000 [IU]/h | INTRAVENOUS | Status: DC
  Administered 2020-02-06: 2250 [IU]/h via INTRAVENOUS
  Filled 2020-02-06: qty 250

## 2020-02-06 MED ORDER — PROPOFOL 10 MG/ML IV BOLUS
INTRAVENOUS | Status: AC
  Filled 2020-02-06: qty 20

## 2020-02-06 MED ORDER — ONDANSETRON HCL 4 MG/2ML IJ SOLN
INTRAMUSCULAR | Status: AC
  Filled 2020-02-06: qty 2

## 2020-02-06 SURGICAL SUPPLY — 40 items
BANDAGE ELASTIC 4 VELCRO NS (GAUZE/BANDAGES/DRESSINGS) ×6 IMPLANT
BANDAGE ELASTIC 6 VELCRO NS (GAUZE/BANDAGES/DRESSINGS) ×3 IMPLANT
BLADE AVERAGE 25MMX9MM (BLADE) ×1
BLADE AVERAGE 25X9 (BLADE) ×3 IMPLANT
BLADE SURG 15 STRL LF DISP TIS (BLADE) ×2 IMPLANT
BLADE SURG 15 STRL SS (BLADE) ×2
BNDG CONFORM 2 STRL LF (GAUZE/BANDAGES/DRESSINGS) ×4 IMPLANT
BNDG ELASTIC 4X5.8 VLCR NS LF (GAUZE/BANDAGES/DRESSINGS) ×4 IMPLANT
BNDG GAUZE ELAST 4 BULKY (GAUZE/BANDAGES/DRESSINGS) ×9 IMPLANT
CLOTH BEACON ORANGE TIMEOUT ST (SAFETY) ×4 IMPLANT
COVER LIGHT HANDLE STERIS (MISCELLANEOUS) ×8 IMPLANT
COVER WAND RF STERILE (DRAPES) ×4 IMPLANT
DRSG ADAPTIC 3X8 NADH LF (GAUZE/BANDAGES/DRESSINGS) ×4 IMPLANT
ELECT REM PT RETURN 9FT ADLT (ELECTROSURGICAL) ×4
ELECTRODE REM PT RTRN 9FT ADLT (ELECTROSURGICAL) ×2 IMPLANT
GAUZE SPONGE 4X4 12PLY STRL (GAUZE/BANDAGES/DRESSINGS) ×8 IMPLANT
GAUZE XEROFORM 5X9 LF (GAUZE/BANDAGES/DRESSINGS) ×15 IMPLANT
GLOVE BIO SURGEON STRL SZ 6.5 (GLOVE) ×3 IMPLANT
GLOVE BIO SURGEON STRL SZ7 (GLOVE) ×6 IMPLANT
GLOVE BIO SURGEONS STRL SZ 6.5 (GLOVE) ×1
GLOVE BIOGEL PI IND STRL 6.5 (GLOVE) ×2 IMPLANT
GLOVE BIOGEL PI IND STRL 7.0 (GLOVE) ×4 IMPLANT
GLOVE BIOGEL PI INDICATOR 6.5 (GLOVE) ×2
GLOVE BIOGEL PI INDICATOR 7.0 (GLOVE) ×4
GOWN STRL REUS W/TWL LRG LVL3 (GOWN DISPOSABLE) ×11 IMPLANT
HEMOSTAT SURGICEL 4X8 (HEMOSTASIS) ×3 IMPLANT
KIT TURNOVER KIT A (KITS) ×4 IMPLANT
MANIFOLD NEPTUNE II (INSTRUMENTS) ×4 IMPLANT
NDL HYPO 25X1 1.5 SAFETY (NEEDLE) ×1 IMPLANT
NEEDLE HYPO 25X1 1.5 SAFETY (NEEDLE) ×4 IMPLANT
NS IRRIG 1000ML POUR BTL (IV SOLUTION) ×4 IMPLANT
PACK BASIC LIMB (CUSTOM PROCEDURE TRAY) ×3 IMPLANT
PAD ARMBOARD 7.5X6 YLW CONV (MISCELLANEOUS) ×4 IMPLANT
RASP SM TEAR CROSS CUT (RASP) ×4 IMPLANT
SET BASIN LINEN APH (SET/KITS/TRAYS/PACK) ×4 IMPLANT
SOL PREP PROV IODINE SCRUB 4OZ (MISCELLANEOUS) ×3 IMPLANT
SPONGE LAP 18X18 RF (DISPOSABLE) ×4 IMPLANT
SUT ETHILON 3 0 FSL (SUTURE) ×4 IMPLANT
SUT VIC AB 2-0 CT2 27 (SUTURE) ×4 IMPLANT
SYR CONTROL 10ML LL (SYRINGE) ×4 IMPLANT

## 2020-02-06 NOTE — Op Note (Signed)
[facility] Operative Note  02/06/20  Preoperative Diagnosis:  Right 2nd toe possible wet gangrene, right lower extremity cellulitis with blistering and possible epidermal necrosis     Postoperative Diagnosis: Right 2nd toe abscess, skin infection; right lower extremity cellulitis with petechia type rash   Procedure(s) Performed: Debridement of epidermis and dermis of right 2nd toe with evacuation of abscess (1X3cm), superficial debridement of epidermis of right leg and skin punch biopsy X 2    Surgeon: Ria Comment C. Constance Haw, MD   Assistants: No qualified resident was available    Anesthesia: General endotracheal   Anesthesiologist: Denese Killings, MD    Specimens: Right leg skin biopsy (over petechial rash area)    Estimated Blood Loss: Minimal   Blood Replacement: None    Complications: None   Wound Class: Dirty/  Infected    Operative Indications: [name] is a 64 yo who has been in the hospital with right lower extremity cellulitis and toe infection that had worsened acutely and was concerning for wet gangrene of the right 2nd toe and also concerning for possible necrotizing infection in the lower extremity. We were unable to get MRI yesterday due to the patient being intubated and opted to take her back to the OR this Am to see if the leg declared itself. This AM the toe still had purulent drainage and felt boggy, and the skin with blistering was stable. We opted to debridement and possible toe amputation. I had discussed with the daughter the risk of bleeding, worsening infection, need for more surgery or amputation, and discussed that her mother was sick and had been intubated for an extended period of time and would potentially need a tracheostomy. At this time the family wanted to pursue all measures.   Findings: Superficial abscess of the right 2nd toe with debridement down to the subcutaneous tissue, dry eschar and toenail on tip, no purulence  expressed; sloughing of epidermis and possible epidermal necrosis with dermal petechia/ rash            Procedure: The patient was taken to the operating room and placed supine. General endotracheal anesthesia was already in place as she has been intubated for over a week and came from the ICU. Anesthesia took over her care and monitoring for the procedure.. Intravenous antibiotics were not given as she was on antibiotics and cultures were potentially planned. The right leg and foot were prepared and draped in the usual sterile fashion.   On examining the toe there was purulent drainage and sloughing of the dermis and epidermis but no abscess cavity tracking deeper and no purulence expressed. The tip of the toe had a dry eschar and a dry toenail, no purulence was noted. Given this it did not appear that the infection went deeper and that this was superficial so an amputation was not performed. The debridement area was approximately 1cm X 3cm in size.    The right lower extremity was then examined. There was sloughing of the epidermis with weeping of serous fluid. The epidermis was easily wiped off with saline and a laparotomy pad revealing the dermal layer which appeared to have a petechial type rash. This was well demarcated. This covered the medial and lateral portions of the leg. Two separate full thickeness punch skin biopsies were taken and sent fresh for pathology to review as I am unsure if this is rash, some sort of vasculitis or related to in cellulitis. The skin punch biopsy sites were made hemostatic and  surgicel was packed into the wounds. Bacitracin and Xeroform were placed over the petechia area on the leg and over the 2nd toe. The leg and toe were wrapped with kerlix and ACE bandage.    Final inspection revealed acceptable hemostasis. All counts were correct at the end of the case. The patient was taken back to the ICU without complication.     Curlene Labrum, MD [facility] [address] [place],  [id number] [phone] (office)

## 2020-02-06 NOTE — Progress Notes (Signed)
ANTICOAGULATION CONSULT NOTE - [name] [name] for Heparin Indication: atrial fibrillation  Allergies  Allergen Reactions  . Bactrim [Sulfamethoxazole-Trimethoprim] Anaphylaxis  . Amoxicillin Hives  . Diclofenac Tinitus    swelling in feet  . Morphine And Related Nausea And Vomiting and Other (See Comments)    headache  . Potassium-Containing Compounds Itching    Patient Measurements: Height: 5\' 5"  (165.1 cm) Weight: 105.1 kg (231 lb 11.3 oz) IBW/kg (Calculated) : 57 Heparin Dosing Weight:  80 kg  Vital Signs: Temp: 98.3 F (36.8 C) (09/16 1050) Temp Source: Axillary (09/16 1050) BP: 103/40 (09/16 1000) Pulse Rate: 85 (09/16 1050)  Labs: Recent Labs    02/04/20 0814 02/05/20 0424 02/05/20 0725  HGB 12.2 12.5  --   HCT 42.6 43.2  --   PLT 187 211  --   HEPARINUNFRC 0.46  --  0.34  CREATININE 0.42* 0.34*  --     Estimated Creatinine Clearance: 86.6 mL/min (A) (by C-G formula based on SCr of 0.34 mg/dL (L)).  Assessment: Pharmacy was consulted to dose heparin in 64 yr old female with atrial fibrillation.  Patient was not on anticoagulation prior to admission. 9/16 Heparin held for debridement of toe in OR. Surgeon okay to restart at noon.  CBC WNL   Goal of Therapy:  Heparin level 0.3-0.7 units/ml Monitor platelets by anticoagulation protocol: Yes   Plan:  Restart heparin infusion rate at 2250 units/hr  Monitor heparin level in ~6-8 hours after restart and daily heparin level, CBC Monitor for signs/symptoms of bleeding  Isac Sarna, BS [place], BCPS Clinical Pharmacist Pager [phone] 02/06/2020 11:01 AM

## 2020-02-06 NOTE — Progress Notes (Signed)
NAME:  [name] [name], MRN:  [id number], DOB:  1955-07-17, LOS: 10 ADMISSION DATE:  01/26/2020, CONSULTATION DATE:  01/28/20 REFERRING MD:  Ortiz/ Triad, CHIEF COMPLAINT:  resp distress    Brief History   64 yo female smoker presented 9/05 with Rt foot pain and swelling.  This was associated with chills, myalgias from sepsis with cellulitis.  Developed A fib with RV and then hypoxia.  Required intubation 9/07 and PCCM consulted.  Past Medical History  COPD on home oxygen, HTN, DM, Bipolar  Significant Hospital Events   9/05 Admit 9/07 intubated 9/08 back in sinus rhythm 9/14 fever, Abx changed 9/16 debridement of Rt 2nd toe with evacuation of abscess, superficial debridement of Rt lung with punch biopsy x 2  Consults:  Cardiology Surgery Palliative care  Procedures:  ETT 9/07 >>  Lt PICC 9/12 >>   Significant Diagnostic Tests:   Echo 9/08 >> EF 55 to 60%  CT Rt leg 9/11 >> diffuse subcutaneous soft tissue swelling, moderate myofasciitis involving mid to lower calf, no osteomyelitis or septic arthritis  Micro Data:  COVID 9/05 >> negative MRSA PCR 9/08 >> negative Blood 9/05 >> Group B Streptococcus Blood 9/07 >> negative  Antimicrobials:  Vancomycin 9/05 Rocephin 9/05 Ancef 9/06 >> 9/10 Vancomycin 9/11 >>  Clindamycin 9/14 >>  Ancef 9/15 >>   Interim history/subjective:  Went to OR this morning.  Low Vt and increased RR with SBT.  Objective   Blood pressure (!) 115/43, pulse 84, temperature 98.3 F (36.8 C), temperature source Axillary, resp. rate 20, height 5\' 5"  (1.651 m), weight 105.1 kg, SpO2 93 %.    Vent Mode: PRVC FiO2 (%):  [45 %-60 %] 60 % Set Rate:  [16 bmp] 16 bmp Vt Set:  [450 mL] 450 mL PEEP:  [5 cmH20] 5 cmH20 Plateau Pressure:  [19 cmH20-23 cmH20] 23 cmH20   Intake/Output Summary (Last 24 hours) at 02/06/2020 1440 Last data filed at 02/06/2020 0825 Gross per 24 hour  Intake 1159.5 ml  Output 600 ml  Net 559.5 ml   Filed Weights    02/04/20 0500 02/05/20 0500 02/06/20 0500  Weight: 103.6 kg 104.8 kg 105.1 kg    Examination:  General - sedated Eyes - pupils reactive ENT - ETT in place Cardiac - regular rate/rhythm, no murmur Chest - decreased BS at bases, scattered rhonchi Abdomen - soft, non tender, + bowel sounds Extremities - 1+ edema Skin - Rt lower leg in wrap Neuro - RASS -1, follows simple commands  Resolved Hospital Problem list      Assessment & Plan:   Acute on chronic hypoxic/hypercapnic respiratory failure from acute pulmonary edema in setting of A fib with RVR, COPD exacerbation, and mucus plugging. - not tolerating SBT at this time - f/u CXR intermittently - scheduled BDs, bronchial hygiene  Group B Streptococcal bacteremia 2nd to Rt lower leg cellulitis. - ABx per primary team - s/p debridement on 9/16; post op care per surgery and wound care  Transient A fib with RVR. - back in sinus rhythm  Acute metabolic encephalopathy from sepsis, hypoxia. Hx of ETOH, depression, anxiety, seizures. - versed gtt with prn fentanyl for RASS goa 0 to -1  Goals of care. - palliative care consulted - DNR - family would not want trach/peg/long term vent support - will see what progress she makes over the next few days; if no significant improvement by 9/20 then family might opt for one way extubation  Best practice:  Diet: tube feeds  DVT prophylaxis: heparin gtt GI prophylaxis: protonix Mobility: bed rest Code Status: DNR Disposition: ICU  Labs    CMP Latest Ref Rng & Units 02/06/2020 02/05/2020 02/04/2020  Glucose 70 - 99 mg/dL 117(H) 130(H) 114(H)  BUN 8 - 23 mg/dL 20 23 27(H)  Creatinine 0.44 - 1.00 mg/dL 0.38(L) 0.34(L) 0.42(L)  Sodium 135 - 145 mmol/L 135 136 140  Potassium 3.5 - 5.1 mmol/L 4.4 4.9 4.2  Chloride 98 - 111 mmol/L 90(L) 90(L) 90(L)  CO2 22 - 32 mmol/L 35(H) 35(H) 38(H)  Calcium 8.9 - 10.3 mg/dL 8.6(L) 8.9 9.0  Total Protein 6.5 - 8.1 g/dL 6.3(L) - -  Total Bilirubin 0.3  - 1.2 mg/dL 1.4(H) - -  Alkaline Phos 38 - 126 U/L 131(H) - -  AST 15 - 41 U/L 19 - -  ALT 0 - 44 U/L 29 - -    CBC Latest Ref Rng & Units 02/06/2020 02/05/2020 02/04/2020  WBC 4.0 - 10.5 K/uL 11.9(H) 14.0(H) 9.1  Hemoglobin 12.0 - 15.0 g/dL 10.2(L) 12.5 12.2  Hematocrit 36 - 46 % 35.4(L) 43.2 42.6  Platelets 150 - 400 K/uL 194 211 187    ABG    Component Value Date/Time   PHART 7.412 02/02/2020 1352   PCO2ART 67.7 (HH) 02/02/2020 1352   PO2ART 98.0 02/02/2020 1352   HCO3 38.8 (H) 02/02/2020 1352   O2SAT 97.3 02/02/2020 1352    CBG (last 3)  Recent Labs    02/06/20 0218 02/06/20 0445 02/06/20 1150  GLUCAP 106* 97 123*    Critical care time: 31 minutes  Chesley Mires, MD [place] Pager - [phone] 02/06/2020, 2:40 PM

## 2020-02-06 NOTE — Progress Notes (Signed)
ANTICOAGULATION CONSULT NOTE - Follow Up Consult  Pharmacy Consult for Heparin Indication: atrial fibrillation  Allergies  Allergen Reactions  . Bactrim [Sulfamethoxazole-Trimethoprim] Anaphylaxis  . Amoxicillin Hives  . Diclofenac Tinitus    swelling in feet  . Morphine And Related Nausea And Vomiting and Other (See Comments)    headache  . Potassium-Containing Compounds Itching    Patient Measurements: Height: 5\' 5"  (165.1 cm) Weight: 105.1 kg (231 lb 11.3 oz) IBW/kg (Calculated) : 57 Heparin Dosing Weight: 80 kg  Vital Signs: Temp: 98.5 F (36.9 C) (09/16 1541) Temp Source: Axillary (09/16 1541) BP: 142/89 (09/16 2113) Pulse Rate: 79 (09/16 2100)  Labs: Recent Labs    02/04/20 0814 02/04/20 0814 02/05/20 0424 02/05/20 0725 02/06/20 1040 02/06/20 1943  HGB 12.2   < > 12.5  --  10.2*  --   HCT 42.6  --  43.2  --  35.4*  --   PLT 187  --  211  --  194  --   HEPARINUNFRC 0.46  --   --  0.34  --  0.26*  CREATININE 0.42*  --  0.34*  --  0.38*  --    < > = values in this interval not displayed.    Estimated Creatinine Clearance: 86.6 mL/min (A) (by C-G formula based on SCr of 0.38 mg/dL (L)).  Assessment:  Pharmacy was consulted to dose heparin in 64 yr old female with atrial fibrillation. Patient was not on anticoagulation prior to admission.   Heparin held 9/15 pm for debridement of toe in OR. Surgeon okay to restart at noon.  Resumed at prior rate of 2250 units/hr.    Heparin level tonight is 0.26, just below target range.  Goal of Therapy:  Heparin level 0.3-0.7 units/ml Monitor platelets by anticoagulation protocol: Yes   Plan:  Increase heparin drip to 2350 units/hr. Next heparin level and CBC in am. Monitor for signs/symptoms of bleeding.  Arty Baumgartner, [place] 02/06/2020,9:47 PM

## 2020-02-06 NOTE — Anesthesia Postprocedure Evaluation (Signed)
Anesthesia Post Note  Patient: [name] [name]  Procedure(s) Performed: SKIN BIOPSY WITH DEBRIDEMENT RIGHT LOWER LEG (Right Leg Lower)  Patient location during evaluation: ICU Anesthesia Type: General Level of consciousness: patient remains intubated per anesthesia plan Pain management: pain level controlled Vital Signs Assessment: post-procedure vital signs reviewed and stable Respiratory status: patient remains intubated per anesthesia plan Cardiovascular status: blood pressure returned to baseline and stable Postop Assessment: no headache and no backache Anesthetic complications: no   No complications documented.   Last Vitals:  Vitals:   02/06/20 0600 02/06/20 0615  BP: (!) 129/56 125/65  Pulse: (!) 102 (!) 104  Resp: 18 (!) 22  Temp:    SpO2: 93% 93%    Last Pain:  Vitals:   02/06/20 0400  TempSrc: Oral  PainSc:                  Tacy Learn

## 2020-02-06 NOTE — Progress Notes (Addendum)
PROGRESS NOTE    [name] [name]  XAJ:[id number] DOB: 12-05-1955 DOA: 01/26/2020 PCP: Leonard Downing, MD   Brief Narrative:  Per HPI: [name] [name] a 64 y.o.femalewith medical history significant forCOPD on 2L [place] at bedtime, hypertension, type 2 diabetes, obesity, bipolar disorder/anxiety disorder, ongoing tobacco use,questionable ongoing alcohol abuse,and prior polysubstance abuse with methamphetamine and alcohol who presented to the ED with progressive worsening of pain, erythema, and serosanguineous drainage from her right second toe. This began to gradually develop after her daughter trimmed her nail too close to her skin approximately 2 weeks ago. She has noticed some fever, chills, body aches, and mild headache as well as general malaise at home. She tried soaking her foot at home with no improvement in her symptoms. She denies any chest pain, shortness of breath, abdominal pain, nausea, vomiting, or dysuria. She denies any upper respiratory symptoms as well. She has not been vaccinated for COVID-19.  ED Course:Vital signs demonstrate ongoing sinus tachycardia and this is confirmed on EKG with heart rate approximately 110 bpm. Temperature 1-1.5 Fahrenheit. She is noted to have a leukocytosis of 12,900 as well as a thrombocytopenia of 129,000. BUN is 38 and creatinine is elevated at 1.1 with baseline 0.3-0.5. Chest x-ray with mild cardiomegaly and no other acute findings. Covid testing negative. Right foot x-ray with no soft tissue swelling or signs of osteomyelitis noted. Lactic acid is 1.5. Patient has been ordered and started 3 L fluid bolus as well as vancomycin and Rocephin for suspected sepsis secondary to cellulitis.  9/14: Patient remains sedated on ventilator this morning with PCCM following.  She is started to have some signs of ileus.  Tube feedings have been discontinued.  Plan to restart today and see if this is tolerated.  Try bisacodyl suppository  as well.  Continue IV antibiotics with vancomycin to treat group B strep bacteremia as well as what appears to be worsening cellulitis and mild fasciitis to right lower extremity.  TEE suspended until patient's clinical condition further improved.  9/15: Pt remains on ventilator.  Concerning right leg lesions and toe lesion, surgery consult requested.  Palliative medicine consulted for goals of care discussions.    Assessment & Plan:   Active Problems:   Acute respiratory failure with hypoxia and hypercapnia (HCC)   Sepsis (HCC)   Cellulitis of right lower leg   Atrial fibrillation with RVR (HCC)   Pleural effusion on right   Leukocytosis   Gangrene of toe of right foot (HCC)   Petechial rash   Toe infection   1-sepsis: In the setting of Group A Streptococcus cellulitis/bacteremia. Present on admission -Patient met sepsis criteria on admission with sinus tachycardia, elevated white blood cells, febrile and with identified source of infection of cellulitic process. She had acute kidney injury as organ dysfunction. -No signs of osteomyelitis onfootchest x-ray -Blood culture positive for Group A Streptococcus -Continue on vancomycin for both MRSA coverage along with Group A strep -Transthoracic echocardiogram with LVEF 55 to 60% and no vegetations noted, appreciate cardiology consultation for TEEonce extubated -CT right lower extremity on 9/11 with findings of diffuse soft tissue edema and moderate fasciitis, no abscess or fluid collection noted. -Continue as needed antipyretics and supportive care.  Right 2nd toe infection and right leg with necrotic black areas concerning and I have asked for a surgical consultation.  -POD#0 s/p OR debridement and skin biopsy by Dr. Constance Haw. Follow up pathology.    2-acute on chronic respiratory failure with hypoxia and hypercapnias/p intubation -Appears  to be a combination of COPD exacerbation, Underlying obesity hypoventilation syndrome and  component of interstitial pulmonary edema/vascular congestion after fluid resuscitation as per sepsis protocol. -Worsening hypoxemia on 9/12 that has now improved after diuresis, holding further Lasix for now due to poor intake -PCCM following with further recommendations appreciated  3-acute kidney injury-resolved -Baseline creatinine 0.6-0.7, currently around0.4 -Monitor labs closely with Lasix for diuresis prn -Continue to monitor in a.m. -May need to start IV fluid if patient cannot tolerate tube feeds.  4-mild thrombocytopenia-resolved -Likely related to acute sepsis and underlying history of alcohol abuse -No signs of overt bleeding -Monitor carefully while on heparin drip -Follow CBC.  5-alcohol abuse -CIWA monitoring -Continue thiamine and folic acid.  6-class II obesity  -Body mass index is 36.58 kg/m. -Low calorie diet, portion control and increase physical activity discussed with patient.  7-history of hypertension-stable -Monitor closely with IV Lasix ordered -Clonidine 3 times daily per PCCM -Hydrocortisone discontinued. -Continue oral Cardizem -Labetalol as needed  8-history of tobacco abuse -Cessation counseling has been provided. -Continue nicotine patch.  9-depression/anxiety/history of seizures -Plan is to resume psychotropic home medications once able to take by mouth. -Continue Depakote  10-type 2 diabetes mellitus -A1c 5.8 -While inpatient continue sliding scale insulin -At discharge will resume the use of Metformin in order to continue assisting with management of insulin resistant and obesity. -Modified carbohydrate diet and lifestylerecommended after discharge.  CBG (last 3)  Recent Labs    02/05/20 2120 02/06/20 0218 02/06/20 0445  GLUCAP 87 106* 97   11-new onset atrial fibrillation with RVR-currently in sinus rhythm -Continue oral Cardizem as ordered -TSH 0.294, check freeT3 0.7, T4 0.75 -2D echocardiogram with LVEF  55-60% -CHA2DS2-VASc score of 3 for which heparin driphad been started, may not require anticoagulation on discharge if this appears to simply be an isolated event. -Appreciatecardiology recommendations for eventual TEE once extubated  12-Hypokalemia-resolved -Monitor  13-ileus with poor tolerance of tube feeds -Trial of bisacodyl suppository today -Maintain magnesium and potassium of 2 and 4 respectively -Pt continues to vomit tube feeds. HOLDING TUBE FEEDS FOR NOW.    DVT prophylaxis:Heparin drip (temporarily held for surgery) Code Status:DNR after discussion with both daughters they were agreeable to DNR status Family Communication:Discussed with daughter at bedside9/14, 9/15, 9/16 daughter [name] [phone] Disposition Plan: Status is: Inpatient  Remains inpatient appropriate because:Ongoing diagnostic testing needed not appropriate for outpatient work up, IV treatments appropriate due to intensity of illness or inability to take PO and Inpatient level of care appropriate due to severity of illness.  Palliative medicine consulted for goals of care discussions.   Dispo: The patient is from:Home Anticipated d/c is to:SNF Anticipated d/c date is: > 3 days Patient currently is not medically stable to d/c.Patient currently remains intubated on the ventilator and will need further evaluation per cardiology along with ongoing evaluation per PCCM.  Consultants:  PCCM  Cardiology  Procedures:  2D echocardiogram 9/8 with LVEF 55-60%. No vegetations.  Antimicrobials:  Anti-infectives (From admission, onward)   Start     Dose/Rate Route Frequency Ordered Stop   02/05/20 2200  ceFAZolin (ANCEF) IVPB 2g/100 mL premix        2 g 200 mL/hr over 30 Minutes Intravenous Every 8 hours 02/05/20 1309     02/04/20 2200  clindamycin (CLEOCIN) IVPB 600 mg        600 mg 100 mL/hr over 30 Minutes Intravenous Every 8 hours 02/04/20  2018     02/02/20 1100  vancomycin (VANCOREADY) IVPB  1500 mg/300 mL  Status:  Discontinued        1,500 mg 150 mL/hr over 120 Minutes Intravenous Every 24 hours 02/01/20 1225 02/05/20 1308   02/01/20 1130  vancomycin (VANCOREADY) IVPB 2000 mg/400 mL        2,000 mg 200 mL/hr over 120 Minutes Intravenous  Once 02/01/20 1108 02/01/20 2010   01/27/20 1500  vancomycin (VANCOREADY) IVPB 1500 mg/300 mL  Status:  Discontinued        1,500 mg 150 mL/hr over 120 Minutes Intravenous Every 24 hours 01/26/20 1428 01/27/20 0827   01/27/20 0830  ceFAZolin (ANCEF) IVPB 2g/100 mL premix  Status:  Discontinued        2 g 200 mL/hr over 30 Minutes Intravenous Every 8 hours 01/27/20 0827 02/01/20 1222   01/26/20 1545  vancomycin (VANCOCIN) IVPB 1000 mg/200 mL premix  Status:  Discontinued        1,000 mg 200 mL/hr over 60 Minutes Intravenous  Once 01/26/20 1541 01/26/20 1553   01/26/20 1415  vancomycin (VANCOCIN) IVPB 1000 mg/200 mL premix  Status:  Discontinued        1,000 mg 200 mL/hr over 60 Minutes Intravenous  Once 01/26/20 1408 01/26/20 1413   01/26/20 1415  cefTRIAXone (ROCEPHIN) 2 g in sodium chloride 0.9 % 100 mL IVPB        2 g 200 mL/hr over 30 Minutes Intravenous  Once 01/26/20 1408 01/26/20 2011   01/26/20 1415  vancomycin (VANCOREADY) IVPB 2000 mg/400 mL        2,000 mg 200 mL/hr over 120 Minutes Intravenous  Once 01/26/20 1413 01/26/20 2011      Subjective: Patient remains sedated on vent.    Objective: Vitals:   02/06/20 0530 02/06/20 0545 02/06/20 0600 02/06/20 0615  BP: (!) 105/47 (!) 122/57 (!) 129/56 125/65  Pulse: 85 95 (!) 102 (!) 104  Resp: '18 18 18 ' (!) 22  Temp:      TempSrc:      SpO2: 91% 94% 93% 93%  Weight:      Height:        Intake/Output Summary (Last 24 hours) at 02/06/2020 0857 Last data filed at 02/06/2020 0825 Gross per 24 hour  Intake 2010.91 ml  Output 600 ml  Net 1410.91 ml   Filed Weights   02/04/20 0500 02/05/20 0500 02/06/20 0500  Weight:  103.6 kg 104.8 kg 105.1 kg    Examination:  General exam: Appears sedated on ventilator Respiratory system: Coarse bilaterally.  Intubated on FiO2 40% Cardiovascular system: S1 & S2 heard. Gastrointestinal system: Abdomen minimally distended, bowel sounds present. Central nervous system: Sedated on ventilator Extremities: necrotic black areas on right leg with right 2nd toe with purulent drainage seen. Skin:      As above Psychiatry: Cannot be assessed  Data Reviewed: I have personally reviewed following labs and imaging studies  CBC: Recent Labs  Lab 02/01/20 0613 02/02/20 0710 02/03/20 0611 02/04/20 0814 02/05/20 0424  WBC 10.0 10.8* 8.9 9.1 14.0*  HGB 11.7* 11.5* 10.8* 12.2 12.5  HCT 40.0 39.3 37.6 42.6 43.2  MCV 95.9 97.3 97.2 99.3 95.8  PLT 153 178 181 187 497   Basic Metabolic Panel: Recent Labs  Lab 01/30/20 1512 01/31/20 0607 02/01/20 0613 02/02/20 0710 02/03/20 0611 02/04/20 0814 02/05/20 0424  NA  --    < > 142 142 142 140 136  K  --    < > 4.2 4.3 4.1 4.2 4.9  CL  --    < >  97* 96* 92* 90* 90*  CO2  --    < > 35* 38* 41* 38* 35*  GLUCOSE  --    < > 156* 155* 180* 114* 130*  BUN  --    < > 32* 28* 28* 27* 23  CREATININE  --    < > 0.38* 0.37* 0.36* 0.42* 0.34*  CALCIUM  --    < > 8.6* 8.7* 9.2 9.0 8.9  MG 2.1  --   --   --  1.7 2.2 1.9  PHOS 2.3*  --   --   --   --   --   --    < > = values in this interval not displayed.   GFR: Estimated Creatinine Clearance: 86.6 mL/min (A) (by C-G formula based on SCr of 0.34 mg/dL (L)). Liver Function Tests: No results for input(s): AST, ALT, ALKPHOS, BILITOT, PROT, ALBUMIN in the last 168 hours. No results for input(s): LIPASE, AMYLASE in the last 168 hours. No results for input(s): AMMONIA in the last 168 hours. Coagulation Profile: No results for input(s): INR, PROTIME in the last 168 hours. Cardiac Enzymes: No results for input(s): CKTOTAL, CKMB, CKMBINDEX, TROPONINI in the last 168 hours. BNP (last  3 results) No results for input(s): PROBNP in the last 8760 hours. HbA1C: No results for input(s): HGBA1C in the last 72 hours. CBG: Recent Labs  Lab 02/05/20 1225 02/05/20 1608 02/05/20 2120 02/06/20 0218 02/06/20 0445  GLUCAP 108* 92 87 106* 97   Lipid Profile: No results for input(s): CHOL, HDL, LDLCALC, TRIG, CHOLHDL, LDLDIRECT in the last 72 hours. Thyroid Function Tests: No results for input(s): TSH, T4TOTAL, FREET4, T3FREE, THYROIDAB in the last 72 hours. Anemia Panel: No results for input(s): VITAMINB12, FOLATE, FERRITIN, TIBC, IRON, RETICCTPCT in the last 72 hours. Sepsis Labs: No results for input(s): PROCALCITON, LATICACIDVEN in the last 168 hours.  Recent Results (from the past 240 hour(s))  Culture, blood (Routine X 2) w Reflex to ID Panel     Status: None   Collection Time: 01/28/20  1:01 PM   Specimen: BLOOD RIGHT HAND  Result Value Ref Range Status   Specimen Description BLOOD RIGHT HAND  Final   Special Requests   Final    BOTTLES DRAWN AEROBIC ONLY Blood Culture results may not be optimal due to an inadequate volume of blood received in culture bottles   Culture   Final    NO GROWTH 6 DAYS Performed at [facility], [address], [place], [place] [address]    Report Status 02/03/2020 FINAL  Final  Culture, blood (Routine X 2) w Reflex to ID Panel     Status: None   Collection Time: 01/28/20  1:01 PM   Specimen: BLOOD RIGHT WRIST  Result Value Ref Range Status   Specimen Description BLOOD RIGHT WRIST  Final   Special Requests   Final    BOTTLES DRAWN AEROBIC AND ANAEROBIC Blood Culture adequate volume   Culture   Final    NO GROWTH 6 DAYS Performed at [facility], [address], [place], [place] [address]    Report Status 02/03/2020 FINAL  Final  MRSA PCR Screening     Status: None   Collection Time: 01/29/20 11:17 AM   Specimen: Nasal Mucosa; Nasopharyngeal  Result Value Ref Range Status   MRSA by PCR NEGATIVE NEGATIVE Final    Comment:          The GeneXpert MRSA Assay (FDA approved for NASAL specimens only), is  one component of a comprehensive MRSA colonization surveillance program. It is not intended to diagnose MRSA infection nor to guide or monitor treatment for MRSA infections. Performed at [facility], [address], [place], [place] [address]   Urine Culture     Status: None   Collection Time: 02/01/20  8:40 PM   Specimen: Urine, Clean Catch  Result Value Ref Range Status   Specimen Description   Final    URINE, CLEAN CATCH Performed at [facility], [address], [place], [place] [address]    Special Requests   Final    NONE Performed at [facility], [address] [place], [place] [address]    Culture   Final    NO GROWTH Performed at [facility], [place] [address], [place], [place] [address]    Report Status 02/03/2020 FINAL  Final    Radiology Studies: DG Chest Port 1 View  Result Date: 02/06/2020 CLINICAL DATA:  Respiratory distress. EXAM: PORTABLE CHEST 1 VIEW COMPARISON:  02/05/2020 FINDINGS: The endotracheal tube and NG tubes are in good position, unchanged. The cardiac silhouette, mediastinal and hilar contours are stable. Persistent right lower lobe airspace consolidation/pneumonia and parapneumonic effusion. The left lung remains relatively clear. IMPRESSION: 1. Stable support apparatus. 2. Persistent right lower lobe airspace consolidation and parapneumonic effusion. Electronically Signed   By: Marijo Sanes M.D.   On: 02/06/2020 07:36   DG CHEST PORT 1 VIEW  Result Date: 02/05/2020 CLINICAL DATA:  ICU patient with negative COVID test, currently intubated EXAM: PORTABLE CHEST 1 VIEW COMPARISON:  02/05/2020 FINDINGS: Endotracheal tube projects between clavicular heads approximately 5.4 cm above the carina. Gastric tube terminating in the upper abdomen the area of the proximal stomach. Cardiomediastinal contours are enlarged accentuated by portable technique and rotation to the RIGHT. The  engorgement of central pulmonary vascular structures, a RIGHT heart border obscured by RIGHT lower lobe airspace disease and effusion. Background interstitial markings increased. No lobar consolidation or sign of effusion on the LEFT. Evidence of prior trauma to the LEFT lateral ribs as before.  The IMPRESSION: 1. Findings of RIGHT-sided effusion and basilar collapse/consolidation with worsening over a series of prior studies is similar to the most recent exam. 2. Endotracheal tube in place, tip between clavicular heads. 3. Background interstitial prominence may represent pneumonitis or edema similar to the prior study. Electronically Signed   By: Zetta Bills M.D.   On: 02/05/2020 09:53   DG Chest Port 1 View  Result Date: 02/05/2020 CLINICAL DATA:  Check endotracheal tube placement EXAM: PORTABLE CHEST 1 VIEW COMPARISON:  02/04/2020 FINDINGS: Cardiac shadow is stable. Endotracheal tube and gastric catheter are seen and stable. Increasing right-sided pleural effusion and likely underlying infiltrate is noted. Left lung remains clear. No bony abnormality is noted. IMPRESSION: Increasing right-sided pleural effusion and likely underlying infiltrate. Electronically Signed   By: Inez Catalina M.D.   On: 02/05/2020 06:50   CT EXTREMITY LOWER RIGHT W CONTRAST  Result Date: 02/05/2020 CLINICAL DATA:  Cellulitis of the lower extremity fever and body aches EXAM: CT OF THE LOWER RIGHT EXTREMITY WITH CONTRAST TECHNIQUE: Multidetector CT imaging of the lower right extremity was performed according to the standard protocol following intravenous contrast administration. COMPARISON:  None. CONTRAST:  65m OMNIPAQUE IOHEXOL 300 MG/ML  SOLN FINDINGS: Bones/Joint/Cartilage No fracture or dislocation. No areas of cortical destruction or periosteal reaction. Ill-defined sclerotic lesion within the tibial metadiaphysis, likely benign fibro-osseous lesion. 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 A small knee joint effusion is. Ligaments Suboptimally assessed by CT. Muscles and Tendons The muscles surrounding the lower extremity are intact without focal atrophy or tear. The visualized portions of the tendons are intact. Soft tissues Extensive subcutaneous edema seen surrounding the lower extremity extending to the deep fascial layers. Non loculated fluid seen within the subcutaneous soft tissues. No large loculated collections or subcutaneous emphysema. Overlying skin thickening is seen. IMPRESSION: Findings suggestive of diffuse extensive cellulitis involving the lower extremity. Non loculated fluid/phlegmon within the subcutaneous tissues. No evidence abscess or osteomyelitis. Electronically Signed   By: Prudencio Pair M.D.   On: 02/05/2020 17:39   Scheduled Meds: . acetylcysteine  2 mL Nebulization BID  . arformoterol  15 mcg Nebulization BID  . benztropine  0.5 mg Per Tube BID  . budesonide (PULMICORT) nebulizer solution  0.5 mg Nebulization BID  . chlorhexidine gluconate (MEDLINE KIT)  15 mL Mouth Rinse BID  . Chlorhexidine Gluconate Cloth  6 each Topical Daily  . cloNIDine  0.2 mg Per Tube TID  . diltiazem  60 mg Per Tube Q6H  . docusate  100 mg Per Tube BID  . feeding supplement (VITAL HIGH PROTEIN)  1,000 mL Per Tube Q24H  . folic acid  1 mg Per Tube Daily  . insulin aspart  0-9 Units Subcutaneous Q4H  . mouth rinse  15 mL Mouth Rinse 10 times per day  . nicotine  7 mg Transdermal Daily  . pantoprazole sodium  40 mg Per Tube Q24H  . polyethylene glycol  17 g Oral Daily  . thiamine  100 mg Oral Daily   Or  . thiamine  100 mg Intravenous Daily  . valproic acid  500 mg Per Tube BID   Continuous Infusions: . sodium chloride 10 mL/hr at 01/29/20 1401  .  ceFAZolin (ANCEF) IV 2 g (02/06/20 0555)  . clindamycin (CLEOCIN) IV 600 mg (02/06/20 0521)  . fentaNYL infusion INTRAVENOUS 50 mcg/hr (02/06/20 0521)  . midazolam  5 mg/hr (02/06/20 0521)     LOS: 11 days   Critical Care Procedure Note Authorized and Performed by: Murvin Natal MD  Total Critical Care time:  35 mins Due to a high probability of clinically significant, life threatening deterioration, the patient required my highest level of preparedness to intervene emergently and I personally spent this critical care time directly and personally managing the patient.  This critical care time included obtaining a history; examining the patient, pulse oximetry; ordering and review of studies; arranging urgent treatment with development of a management plan; evaluation of patient's response of treatment; frequent reassessment; and discussions with other providers.  This critical care time was performed to assess and manage the high probability of imminent and life threatening deterioration that could result in multi-organ failure.  It was exclusive of separately billable procedures and treating other patients and teaching time.    Irwin Brakeman, MD How to contact the [facility] Attending or Consulting provider [place] or covering provider during after hours [place], for this patient?  1. Check the care team in [facility] and look for a) attending/consulting [facility] provider listed and b) the [facility] team listed 2. Log into [email or web address] and use [place]'s universal password to access. If you do not have the password, please contact the hospital operator. 3. Locate the [facility] provider you are looking for under [facility] and page to a number that you can be directly reached. 4. If  you still have difficulty reaching the provider, please page the [facility] (Director on Call) for the Hospitalists listed on amion for assistance.   If 7PM-7AM, please contact night-coverage [email or web address] 02/06/2020, 8:57 AM

## 2020-02-06 NOTE — Anesthesia Preprocedure Evaluation (Signed)
Anesthesia Evaluation  Patient identified by MRN, date of birth, ID band Patient awake    History of Anesthesia Complications (+) history of anesthetic complications  Airway Mallampati: Intubated       Dental  (+) Dental Advisory Given   Pulmonary shortness of breath, COPD, Current Smoker,   Intubated on vent     + intubated    Cardiovascular hypertension, Pt. on medications Normal cardiovascular exam Rhythm:Regular Rate:Normal     Neuro/Psych  Headaches, PSYCHIATRIC DISORDERS Anxiety Depression Bipolar Disorder    GI/Hepatic negative GI ROS, Neg liver ROS,   Endo/Other  diabetes, Type 2, Insulin DependentHypothyroidism   Renal/GU negative Renal ROS     Musculoskeletal   Abdominal   Peds  Hematology   Anesthesia Other Findings   Reproductive/Obstetrics                             Anesthesia Physical Anesthesia Plan  ASA: IV  Anesthesia Plan: General   Post-op Pain Management:    Induction: Intravenous and Inhalational  PONV Risk Score and Plan:   Airway Management Planned: Oral ETT  Additional Equipment:   Intra-op Plan:   Post-operative Plan: Post-operative intubation/ventilation  Informed Consent:    Discussed DNR with power of attorney and Suspend DNR.     Plan Discussed with: CRNA and Surgeon  Anesthesia Plan Comments:         Anesthesia Quick Evaluation

## 2020-02-06 NOTE — Transfer of Care (Signed)
Immediate Anesthesia Transfer of Care Note  Patient: [name] [name]  Procedure(s) Performed: SKIN BIOPSY WITH DEBRIDEMENT RIGHT LOWER LEG (Right Leg Lower)  Patient Location: ICU  Anesthesia Type:General  Level of Consciousness: sedated and Patient remains intubated per anesthesia plan  Airway & Oxygen Therapy: Patient remains intubated per anesthesia plan  Post-op Assessment: Report given to RN and Post -op Vital signs reviewed and stable  Post vital signs: Reviewed and stable  Last Vitals:  Vitals Value Taken Time  BP 123/47 02/06/20 0848  Temp    Pulse 97 02/06/20 0849  Resp 20 02/06/20 0849  SpO2 89 % 02/06/20 0849  Vitals shown include unvalidated device data.  Last Pain:  Vitals:   02/06/20 0400  TempSrc: Oral  PainSc:       Patients Stated Pain Goal: 0 (58/25/18 9842)  Complications: No complications documented.

## 2020-02-06 NOTE — Consult Note (Signed)
Consultation Note Date: 02/06/2020   Patient Name: [name] [name]  DOB: May 10, 1956  MRN: [id number]  Age / Sex: 64 y.o., female  PCP: [name] Downing, MD Referring Physician: Murlean Iba, MD  Reason for Consultation: Establishing goals of care  HPI/Patient Profile: 64 y.o. female  with past medical history of COPD on HS oxygen, HTN, DM type 2, bipolar, obesity, ETOH/polysubstance abuse history and current smoker admitted on 01/26/2020 with worsening toe wound. This gradually developed after her daughter trimmed nail too close to skin approximately 2 weeks ago. Hospital admission for sepsis, Group A Streptococcus cellulitis/bacteremia, acute on chronic respiratory failure requiring intubation/mechaical ventilation 9/7. Surgery following for right 2nd toe infection and necrotic areas of right leg s/p OR debridement and skin biopsy 9/16. Surgery was questioning need for amputation. Receiving aggressive IV antibiotics. Palliative medicine consultation for goals of care.   Clinical Assessment and Goals of Care:  I have reviewed medical records, discussed extensively with care team, and visited with patient at bedside. [name] [name] remains intubated and sedated. She will open eyes to stimuli but not currently following commands. Vent weans have not been attempted in a few days because of critical condition.   No family at bedside. Daughter, [name] [name] has difficulty finding child care. [name] [name] and I met outside cafeteria to discuss goals of care.   Also spoke with daughter [name] [name] via telephone. She lives in [place].    I introduced Palliative Medicine as specialized medical care for people living with serious illness. It focuses on providing relief from the symptoms and stress of a serious illness. The goal is to improve quality of life for both the patient and the family.  We discussed a brief life  review of the patient. [name] [name] shares that her mother has not been in good health with underlying COPD, infrequent use of oxygen, and current smoker. She admits that she is 'clean' but history of drug and alcohol use. Lives in low income housing down the street from [place]. [name] [name] reports she would not let PCP evaluate her feet.   Discussed events leading up to admission and course of hospitalization in detail including diagnoses, interventions, plan of care. Frankly and compassionately discussed concern with poor prognosis not only from sepsis but acute on chronic respiratory failure. She has been on the ventilator 9 days now. Reviewed recommendations from specialists.  I attempted to elicit values and goals of care important to the patient and family. Advanced directives, concepts specific to code status, artifical feeding and hydration were discussed. Patient does not have a documented living will. No spouse. Two daughters [name] [name] and [name] [name]). [name] [name] has also been in contact with providers this admission.  The difference between aggressive medical intervention and comfort care pathways were discussed, explaining concern that we are nearing a point for consideration of trach/peg vs. Comfort.   [name] [name] and [name] [name] both agree that their mother would NOT wish for prolonged interventions including trach/prolonged ventilator or feeding tube. They share she would be furious if she woke up with a  trach and dependent on others to take care of her in a nursing home. She would not wish for machines to keep her alive and they speak of this being poor quality of life for her. We discussed challenges with weaning her from the ventilator with underlying COPD and current smoker. [name] [name] and [name] [name] are very clear on decision against trach/peg placement if she is unable to wean off ventilator this weekend. Discussed comfort focused pathway and compassionate extubation.   Discussed code status and medical recommendation for DNR,  knowing there mother's wishes. [name] [name] says "let her go to heaven." [name] [name] and [name] [name] agree with code status change to DNR.   Discussed ongoing medical management, watchful waiting through the weekend, and compassionate extubation to comfort measures if no improvement by early next week. Daughters understand and agree with this plan. [name] [name] plans to talk with family this weekend about help with child care so she can be with her mother. [name] [name] would not plan to visit from [place].  Therapeutic listening and emotional/spiritual support provided. PMT contact information given to [name] [name] and explained that this NP is off service through the weekend but will f/u Monday.   Updated Dr. Wynetta Emery, Dr. Constance Haw, Dr. Halford Chessman, and RN.   SUMMARY OF RECOMMENDATIONS    No documented living will. Patient's daughters [name] [name] and [name] [name]) are default HCPOA's. Discussed goals with both daughters.  Daughters agree their mother would NOT wish for prolonged aggressive interventions.  NO trach/prolonged ventilator support or PEG.  Continue current plan of care, watchful waiting through the weekend. Continue wake up assessment/vent wean attempts. Ventilator day 9.   Likely compassionate extubation to comfort early next week if patient fails to show improvement over the weekend.   [facility] PMT provider off service until Monday 9/20 but will f/u with family on Monday.   Code Status/Advance Care Planning:  DNR  Symptom Management:   Per attending  Palliative Prophylaxis:   Aspiration, Bowel Regimen, Delirium Protocol, Frequent Pain Assessment, Oral Care and Turn Reposition  Psycho-social/Spiritual:   Desire for further Chaplaincy support: yes  Additional Recommendations: Caregiving  Support/Resources, Compassionate Wean Education and Education on Hospice  Prognosis:   Poor prognosis  Discharge Planning: To Be Determined      Primary Diagnoses: Present on Admission: . Sepsis ([place]) . Acute respiratory  failure with hypoxia and hypercapnia ([facility]) . Cellulitis of right lower leg . Leukocytosis   I have reviewed the medical record, interviewed the patient and family, and examined the patient. The following aspects are pertinent.  Past Medical History:  Diagnosis Date  . Anxiety   . Bipolar 1 disorder ([place])   . Cancer ([place])    colon  . Chronic diarrhea    Followed by Dr Michail Sermon   . Complication of anesthesia    Hx resp distress during surgery  . COPD (chronic obstructive pulmonary disease) ([place])   . Depression   . Diabetes mellitus ([place])   . External hemorrhoids   . GI bleed   . Headache(784.0)   . Hyperlipidemia   . Hypertension   . Hypothyroidism   . Insomnia   . Internal hemorrhoids   . Polysubstance abuse ([place]) Quit 2008    H/O methamphetamine and alcohol abuse, clean x 6 years  . Shortness of breath   . Suicidal thoughts   . Tobacco abuse   . Tubular adenoma of colon    Social History   Socioeconomic History  . Marital status: Divorced    Spouse name: Not on file  .  Number of children: 2  . Years of education: Not on file  . Highest education level: Not on file  Occupational History  . Occupation: Disabled.  Tobacco Use  . Smoking status: Current Every Day Smoker    Packs/day: 1.00    Years: 42.00    Pack years: 42.00    Types: Cigarettes  . Smokeless tobacco: Never Used  Substance and Sexual Activity  . Alcohol use: Yes    Comment: occ  . Drug use: No  . Sexual activity: Not on file  Other Topics Concern  . Not on file  Social History Narrative   Divorced.  Lives with daughter in [place]. Patient moved from [place] to [place] in August of 2013. Patient has remained sober from alcohol and amphetamine for about 6 years.   Social Determinants of Health   Financial Resource Strain:   . Difficulty of Paying Living Expenses: Not on file  Food Insecurity:   . Worried About Charity fundraiser in the Last Year: Not on file  . Ran  Out of Food in the Last Year: Not on file  Transportation Needs:   . Lack of Transportation (Medical): Not on file  . Lack of Transportation (Non-Medical): Not on file  Physical Activity:   . Days of Exercise per Week: Not on file  . Minutes of Exercise per Session: Not on file  Stress:   . Feeling of Stress : Not on file  Social Connections:   . Frequency of Communication with Friends and Family: Not on file  . Frequency of Social Gatherings with Friends and Family: Not on file  . Attends Religious Services: Not on file  . Active Member of Clubs or Organizations: Not on file  . Attends Archivist Meetings: Not on file  . Marital Status: Not on file   Family History  Problem Relation Age of Onset  . Multiple sclerosis Mother   . Alcoholism Father   . Depression Brother   . Emphysema Maternal Grandfather        smoked  . Breast cancer Neg Hx   . Colon cancer Neg Hx   . Esophageal cancer Neg Hx   . Colon polyps Neg Hx   . Stomach cancer Neg Hx   . Pancreatic cancer Neg Hx   . Liver disease Neg Hx    Scheduled Meds: . arformoterol  15 mcg Nebulization BID  . benztropine  0.5 mg Per Tube BID  . budesonide (PULMICORT) nebulizer solution  0.5 mg Nebulization BID  . chlorhexidine gluconate (MEDLINE KIT)  15 mL Mouth Rinse BID  . Chlorhexidine Gluconate Cloth  6 each Topical Daily  . cloNIDine  0.2 mg Per Tube TID  . diltiazem  60 mg Per Tube Q6H  . docusate  100 mg Per Tube BID  . feeding supplement (VITAL HIGH PROTEIN)  1,000 mL Per Tube Q24H  . folic acid  1 mg Per Tube Daily  . insulin aspart  0-9 Units Subcutaneous Q4H  . mouth rinse  15 mL Mouth Rinse 10 times per day  . nicotine  7 mg Transdermal Daily  . pantoprazole sodium  40 mg Per Tube Q24H  . polyethylene glycol  17 g Oral Daily  . thiamine  100 mg Oral Daily   Or  . thiamine  100 mg Intravenous Daily  . valproic acid  500 mg Per Tube BID   Continuous Infusions: . sodium chloride 10 mL/hr at  01/29/20 1401  .  ceFAZolin (ANCEF) IV 2 g (02/06/20 0555)  . clindamycin (CLEOCIN) IV 600 mg (02/06/20 0521)  . fentaNYL infusion INTRAVENOUS 75 mcg/hr (02/06/20 1042)  . heparin 2,250 Units/hr (02/06/20 1118)  . midazolam 5 mg/hr (02/06/20 0521)   PRN Meds:.sodium chloride, acetaminophen **OR** acetaminophen, hemostatic agents, labetalol, levalbuterol, ondansetron **OR** ondansetron (ZOFRAN) IV Medications Prior to Admission:  Prior to Admission medications   Medication Sig Start Date End Date Taking? Authorizing Provider  albuterol (PROVENTIL) (5 MG/ML) 0.5% nebulizer solution Take 2.5 mg by nebulization every 6 (six) hours as needed for wheezing or shortness of breath. 04/16/12  Yes Bonnielee Haff, MD  amitriptyline (ELAVIL) 100 MG tablet Take 100 mg by mouth at bedtime.    Yes [provider]  amLODipine (NORVASC) 5 MG tablet Take 5 mg by mouth daily.   Yes [provider]  benztropine (COGENTIN) 0.5 MG tablet Take 0.5 mg by mouth 2 (two) times daily. 12/11/19  Yes [provider]  busPIRone (BUSPAR) 10 MG tablet Take 10 mg by mouth 3 (three) times daily.    Yes [provider]  divalproex (DEPAKOTE) 500 MG DR tablet Take 500 mg by mouth 2 (two) times daily.    Yes [provider]  famotidine (PEPCID) 20 MG tablet Take 1 tablet (20 mg total) by mouth 2 (two) times daily. 01/10/20  Yes Milton Ferguson, MD  QUEtiapine (SEROQUEL) 50 MG tablet Take 50 mg by mouth 3 (three) times daily.    Yes [provider]  sertraline (ZOLOFT) 100 MG tablet Take 200 mg by mouth daily.    Yes [provider]  SPIRIVA HANDIHALER 18 MCG inhalation capsule Place 1 capsule into inhaler and inhale daily.  12/17/19  Yes [provider]  metFORMIN (GLUCOPHAGE) 850 MG tablet Take 1 tablet by mouth in the morning and at bedtime. Patient not taking: Reported on 01/26/2020 04/27/17   [provider]   Allergies  Allergen Reactions  . Bactrim  [Sulfamethoxazole-Trimethoprim] Anaphylaxis  . Amoxicillin Hives  . Diclofenac Tinitus    swelling in feet  . Morphine And Related Nausea And Vomiting and Other (See Comments)    headache  . Potassium-Containing Compounds Itching   Review of Systems  Unable to perform ROS: Acuity of condition    Physical Exam Constitutional:      Interventions: She is sedated and intubated.  HENT:     Head: Normocephalic and atraumatic.  Cardiovascular:     Rate and Rhythm: Rhythm irregularly irregular.  Pulmonary:     Effort: No tachypnea, accessory muscle usage or respiratory distress. She is intubated.     Breath sounds: Decreased breath sounds present.     Comments: intubated Skin:    General: Skin is warm and dry.     Comments: R foot/leg dressing C/D/I  Neurological:     Comments: Opens eyes to stimuli    Vital Signs: BP (!) 115/43 (BP Location: Left Arm)   Pulse 84   Temp 98.3 F (36.8 C) (Axillary)   Resp 20   Ht '5\' 5"'  (1.651 m)   Wt 105.1 kg   SpO2 96%   BMI 38.56 kg/m  Pain Scale: CPOT POSS *See Group Information*: 2-Acceptable,Slightly drowsy, easily aroused Pain Score: Asleep   SpO2: SpO2: 96 % O2 Device:SpO2: 96 % O2 Flow Rate: .O2 Flow Rate (L/min): 5 L/min  IO: Intake/output summary:   Intake/Output Summary (Last 24 hours) at 02/06/2020 1518 Last data filed at 02/06/2020 0825 Gross per 24 hour  Intake 1159.5  ml  Output 600 ml  Net 559.5 ml    LBM: Last BM Date: 02/04/20 Baseline Weight: Weight: 99.8 kg Most recent weight: Weight: 105.1 kg     Palliative Assessment/Data: PPS 30%   Flowsheet Rows     Most Recent Value  Intake Tab  Referral Department Hospitalist  Unit at Time of Referral ICU  Palliative Care Primary Diagnosis Pulmonary  Palliative Care Type New Palliative care  Reason for referral Clarify Goals of Care  Date first seen by Palliative Care 02/06/20  Clinical Assessment  Palliative Performance Scale Score 30%  Psychosocial &  Spiritual Assessment  Palliative Care Outcomes  Patient/Family meeting held? Yes  Who was at the meeting? two daughters (dana and [name] [name])  Palliative Care Outcomes Clarified goals of care, Provided end of life care assistance, Provided psychosocial or spiritual support, Changed CPR status, ACP counseling assistance      Time In: 1145 Time Out: 1315 Time Total: 40mn Greater than 50%  of this time was spent counseling and coordinating care related to the above assessment and plan.  Signed by:  MIhor Dow DNP, FNP-C Palliative Medicine Team  Phone: [phone]: [phone]  Please contact Palliative Medicine Team phone at [phone] questions and concerns.  For individual provider: See AShea Evans

## 2020-02-06 NOTE — Interval H&P Note (Signed)
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  The various methods of treatment have been discussed with the patient and family. After consideration of risks, benefits and other options for treatment, the patient has consented to  Procedure(s): AMPUTATION TOE right 2nd toe (Right) DEBRIDEMENT RIGHT LEG, POSSIBLE ABOVE KNEE AMPUTATION (Right) as a surgical intervention.  The patient's history has been reviewed, patient examined, no change in status, stable for surgery.  I have reviewed the patient's chart and labs.  Questions were answered to the patient's satisfaction.    Spoke with her daughter [name] and going to proceed with toe amputation on the right and debridement of the leg. She wants her to be Full code right now for surgery and around time of surgery. She will readdress this with palliative care this afternoon. The blistering is no worse and the redness is potentially better. Patient marked.   Virl Cagey

## 2020-02-07 ENCOUNTER — Inpatient Hospital Stay (HOSPITAL_COMMUNITY): Payer: Medicaid Other

## 2020-02-07 DIAGNOSIS — J9589 Other postprocedural complications and disorders of respiratory system, not elsewhere classified: Secondary | ICD-10-CM

## 2020-02-07 LAB — CBC WITH DIFFERENTIAL/PLATELET
Abs Immature Granulocytes: 0.14 10*3/uL — ABNORMAL HIGH (ref 0.00–0.07)
Basophils Absolute: 0 10*3/uL (ref 0.0–0.1)
Basophils Relative: 0 %
Eosinophils Absolute: 0 10*3/uL (ref 0.0–0.5)
Eosinophils Relative: 0 %
HCT: 32.8 % — ABNORMAL LOW (ref 36.0–46.0)
Hemoglobin: 9.7 g/dL — ABNORMAL LOW (ref 12.0–15.0)
Immature Granulocytes: 2 %
Lymphocytes Relative: 8 %
Lymphs Abs: 0.6 10*3/uL — ABNORMAL LOW (ref 0.7–4.0)
MCH: 28.4 pg (ref 26.0–34.0)
MCHC: 29.6 g/dL — ABNORMAL LOW (ref 30.0–36.0)
MCV: 96.2 fL (ref 80.0–100.0)
Monocytes Absolute: 0.5 10*3/uL (ref 0.1–1.0)
Monocytes Relative: 7 %
Neutro Abs: 6.1 10*3/uL (ref 1.7–7.7)
Neutrophils Relative %: 83 %
Platelets: 179 10*3/uL (ref 150–400)
RBC: 3.41 MIL/uL — ABNORMAL LOW (ref 3.87–5.11)
RDW: 14.3 % (ref 11.5–15.5)
WBC: 7.4 10*3/uL (ref 4.0–10.5)
nRBC: 0 % (ref 0.0–0.2)

## 2020-02-07 LAB — COMPREHENSIVE METABOLIC PANEL
ALT: 22 U/L (ref 0–44)
AST: 14 U/L — ABNORMAL LOW (ref 15–41)
Albumin: 2.1 g/dL — ABNORMAL LOW (ref 3.5–5.0)
Alkaline Phosphatase: 116 U/L (ref 38–126)
Anion gap: 8 (ref 5–15)
BUN: 29 mg/dL — ABNORMAL HIGH (ref 8–23)
CO2: 38 mmol/L — ABNORMAL HIGH (ref 22–32)
Calcium: 8.5 mg/dL — ABNORMAL LOW (ref 8.9–10.3)
Chloride: 93 mmol/L — ABNORMAL LOW (ref 98–111)
Creatinine, Ser: 0.39 mg/dL — ABNORMAL LOW (ref 0.44–1.00)
GFR calc Af Amer: >60 mL/min (ref >60)
GFR calc non Af Amer: >60 mL/min (ref >60)
Glucose, Bld: 131 mg/dL — ABNORMAL HIGH (ref 70–99)
Potassium: 4 mmol/L (ref 3.5–5.1)
Sodium: 139 mmol/L (ref 135–145)
Total Bilirubin: 0.8 mg/dL (ref 0.3–1.2)
Total Protein: 6.3 g/dL — ABNORMAL LOW (ref 6.5–8.1)

## 2020-02-07 LAB — GLUCOSE, CAPILLARY
Glucose-Capillary: 116 mg/dL — ABNORMAL HIGH (ref 70–99)
Glucose-Capillary: 116 mg/dL — ABNORMAL HIGH (ref 70–99)
Glucose-Capillary: 109 mg/dL — ABNORMAL HIGH (ref 70–99)
Glucose-Capillary: 129 mg/dL — ABNORMAL HIGH (ref 70–99)
Glucose-Capillary: 94 mg/dL (ref 70–99)

## 2020-02-07 LAB — HEPARIN LEVEL (UNFRACTIONATED): Heparin Unfractionated: 0.35 IU/mL (ref 0.30–0.70)

## 2020-02-07 MED ORDER — MIDAZOLAM 50MG/50ML (1MG/ML) PREMIX INFUSION
0.0000 mg/h | INTRAVENOUS | Status: DC
  Administered 2020-02-07: 4 mg/h via INTRAVENOUS
  Administered 2020-02-07: 6 mg/h via INTRAVENOUS
  Administered 2020-02-08: 4 mg/h via INTRAVENOUS
  Administered 2020-02-09: 2 mg/h via INTRAVENOUS
  Administered 2020-02-10: 5 mg/h via INTRAVENOUS
  Administered 2020-02-10: 3 mg/h via INTRAVENOUS
  Administered 2020-02-10: 5 mg/h via INTRAVENOUS
  Administered 2020-02-11: 6 mg/h via INTRAVENOUS
  Administered 2020-02-11 – 2020-02-12 (×2): 5 mg/h via INTRAVENOUS
  Filled 2020-02-07 (×11): qty 50

## 2020-02-07 MED ORDER — FENTANYL 2500MCG IN NS 250ML (10MCG/ML) PREMIX INFUSION
50.0000 ug/h | INTRAVENOUS | Status: DC
  Administered 2020-02-07 – 2020-02-12 (×10): 200 ug/h via INTRAVENOUS
  Administered 2020-02-12 – 2020-02-13 (×2): 100 ug/h via INTRAVENOUS
  Administered 2020-02-14: 175 ug/h via INTRAVENOUS
  Administered 2020-02-15: 50 ug/h via INTRAVENOUS
  Administered 2020-02-16: 100 ug/h via INTRAVENOUS
  Administered 2020-02-17: 75 ug/h via INTRAVENOUS
  Filled 2020-02-07 (×18): qty 250

## 2020-02-07 MED ORDER — DOCUSATE SODIUM 50 MG/5ML PO LIQD
100.0000 mg | Freq: Two times a day (BID) | ORAL | Status: DC
  Administered 2020-02-07 – 2020-02-11 (×8): 100 mg via ORAL
  Filled 2020-02-07 (×8): qty 10

## 2020-02-07 MED ORDER — MIDAZOLAM BOLUS VIA INFUSION
1.0000 mg | INTRAVENOUS | Status: DC | PRN
  Administered 2020-02-07 – 2020-02-12 (×12): 2 mg via INTRAVENOUS
  Filled 2020-02-07: qty 2

## 2020-02-07 MED ORDER — ORAL CARE MOUTH RINSE
15.0000 mL | OROMUCOSAL | Status: DC
  Administered 2020-02-07 – 2020-02-13 (×57): 15 mL via OROMUCOSAL

## 2020-02-07 MED ORDER — FENTANYL BOLUS VIA INFUSION
50.0000 ug | INTRAVENOUS | Status: DC | PRN
  Administered 2020-02-10: 50 ug via INTRAVENOUS
  Filled 2020-02-07: qty 50

## 2020-02-07 MED ORDER — RACEPINEPHRINE HCL 2.25 % IN NEBU
INHALATION_SOLUTION | RESPIRATORY_TRACT | Status: AC
  Administered 2020-02-07: 0.5 mL via RESPIRATORY_TRACT
  Filled 2020-02-07: qty 0.5

## 2020-02-07 MED ORDER — MUPIROCIN 2 % EX OINT
TOPICAL_OINTMENT | Freq: Every day | CUTANEOUS | Status: DC
  Administered 2020-02-07: 1 via NASAL
  Filled 2020-02-07 (×6): qty 22

## 2020-02-07 MED ORDER — RACEPINEPHRINE HCL 2.25 % IN NEBU: 0.5000 mL | INHALATION_SOLUTION | Freq: Once | RESPIRATORY_TRACT | Status: AC

## 2020-02-07 MED ORDER — MIDAZOLAM HCL 2 MG/2ML IJ SOLN
INTRAMUSCULAR | Status: AC
  Filled 2020-02-07: qty 2

## 2020-02-07 MED ORDER — PROSOURCE TF PO LIQD
45.0000 mL | Freq: Three times a day (TID) | ORAL | Status: DC
  Administered 2020-02-07 – 2020-02-10 (×9): 45 mL
  Filled 2020-02-07 (×8): qty 45

## 2020-02-07 MED ORDER — CHLORHEXIDINE GLUCONATE 0.12% ORAL RINSE (MEDLINE KIT)
15.0000 mL | Freq: Two times a day (BID) | OROMUCOSAL | Status: DC
  Administered 2020-02-07 – 2020-02-24 (×27): 15 mL via OROMUCOSAL

## 2020-02-07 MED ORDER — PROSOURCE TF PO LIQD: 45.0000 mL | Freq: Two times a day (BID) | ORAL | Status: DC

## 2020-02-07 MED ORDER — VITAL AF 1.2 CAL PO LIQD
1000.0000 mL | ORAL | Status: DC
  Administered 2020-02-07 – 2020-02-09 (×3): 1000 mL

## 2020-02-07 MED ORDER — POLYETHYLENE GLYCOL 3350 17 G PO PACK
17.0000 g | PACK | Freq: Every day | ORAL | Status: DC
  Administered 2020-02-08 – 2020-02-11 (×3): 17 g via ORAL
  Filled 2020-02-07 (×2): qty 1

## 2020-02-07 MED ORDER — MIDAZOLAM HCL (PF) 10 MG/2ML IJ SOLN
INTRAMUSCULAR | Status: AC
  Filled 2020-02-07: qty 2

## 2020-02-07 MED ORDER — VITAL HIGH PROTEIN PO LIQD: 1000.0000 mL | ORAL | Status: DC

## 2020-02-07 MED ORDER — FUROSEMIDE 10 MG/ML IJ SOLN
40.0000 mg | Freq: Once | INTRAMUSCULAR | Status: AC
  Administered 2020-02-07: 40 mg via INTRAVENOUS
  Filled 2020-02-07: qty 4

## 2020-02-07 MED ORDER — LORAZEPAM 2 MG/ML IJ SOLN: 2.0000 mg | INTRAMUSCULAR | Status: DC | PRN

## 2020-02-07 MED ORDER — SACCHAROMYCES BOULARDII 250 MG PO CAPS
250.0000 mg | ORAL_CAPSULE | Freq: Two times a day (BID) | ORAL | Status: DC
  Administered 2020-02-07 – 2020-02-12 (×12): 250 mg
  Filled 2020-02-07 (×14): qty 1

## 2020-02-07 MED ORDER — FENTANYL CITRATE (PF) 100 MCG/2ML IJ SOLN
INTRAMUSCULAR | Status: AC
  Filled 2020-02-07: qty 2

## 2020-02-07 MED ORDER — DEXAMETHASONE SODIUM PHOSPHATE 4 MG/ML IJ SOLN: 4.0000 mg | Freq: Four times a day (QID) | INTRAMUSCULAR | Status: DC

## 2020-02-07 NOTE — Procedures (Signed)
Intubation Procedure Note  [name] [name]  [id number]  12/24/1955  Date:02/07/20  Time:3:10 PM   Provider Performing:Manville Rico    Procedure: Intubation (31500)  Indication(s) Respiratory Failure  Consent Risks of the procedure as well as the alternatives and risks of each were explained to the patient and/or caregiver.  Consent for the procedure was obtained and is signed in the bedside chart   Anesthesia Etomidate, Versed, Fentanyl and Rocuronium   Time Out Verified patient identification, verified procedure, site/side was marked, verified correct patient position, special equipment/implants available, medications/allergies/relevant history reviewed, required imaging and test results available.   Sterile Technique Usual hand hygeine, masks, and gloves were used   Procedure Description Patient positioned in bed supine.  Sedation given as noted above.  Patient was intubated with endotracheal tube using Glidescope.  View was Grade 1 full glottis .  Number of attempts was 1.  Inserted 7.5 ETT to 23 cm at lip.  Colorimetric CO2 detector was consistent with tracheal placement and confirmed with ausculation.   Complications/Tolerance None; patient tolerated the procedure well. Chest X-ray is ordered to verify placement.  [name] Mires, MD [place] Pager - [phone] 02/07/2020, 3:11 PM

## 2020-02-07 NOTE — Progress Notes (Signed)
Nutrition Follow-up  DOCUMENTATION CODES:   Obesity unspecified  INTERVENTION:  Resume Vital 1.2 @ 20 ml/hr via OGT, advance 10 ml every 8 hrs as tolerated to goal rate 45 ml/hr (1080 ml/hr) with 45 ml ProSource TF 3x/daily  Regimen provides1416kcal (meeting 102% of estimated kcal needs) 114grams of protein (100% of estimated protein needs) and 814ml free water  Free water flush per MD  Pt is at high risk for refeeding, recommend continuing to monitor magnesium, potassium, and phosphorus daily as tube feeds progress towards goal  NUTRITION DIAGNOSIS:   Inadequate oral intake related to inability to eat as evidenced by NPO status. -ongoing  GOAL:   Provide needs based on ASPEN/SCCM guidelines -meeting  MONITOR:   Vent status, Weight trends, TF tolerance, Skin, I & O's, Labs  REASON FOR ASSESSMENT:   Rounds, Ventilator, New TF    ASSESSMENT:   Patient is currently intubated on ventilator support after failing BiPAP. She has a history of polysubstance abuse, COPD, HTN, DM2 and bipolar disorder. Presents Acute respiratory failure, sepsis, severe cellulitis RLE toe.   Significant Events:  9/05 Admit 9/07 Intubated 9/08 Started TF 9/11 New onset Afib with RVR 9/13 TF held d/t episodes of vomiting; trial suppository 9/16 OR debridement;skin biopsy of R toe/leg 9/17 Extubated/Reintubated   Pt attempting to pulling at ET tube this morning, weaning trails continuing and pt extubated to 5L O2 at 1335. Initially did well, then developed stridor with increased work of breathing, attempted racepinephrine neb and BiPAP w/out improvement. Daughter's of pt agreeable to reintubation and tracheostomy.    +1 BUE;LLE  +3RLE per RN assessment  Admit wt 100.4 kg     Current wt 105.9 kg   I/O: +17,301 ml since admit UOP: 375 ml x 24 hrs  Patient is currently intubated on ventilator support MV: 7.2 L/min Temp (24hrs), Avg:98.5 F (36.9 C), Min:97.9 F (36.6 C), Max:99.1 F  (37.3 C) BP: 140/52 (cuff) MAP: 82 (cuff) Propofol: none  Medications reviewed and include: Colace, Miralax, Cardizem, Folic acid, SSI, Thiamine, Depakene, Florastor Drips: Ancef, Clindamycin Versed  Fentanyl Heparin Labs: CBGs [phone]   Diet Order:   Diet Order            Diet NPO time specified  Diet effective now                 EDUCATION NEEDS:   No education needs have been identified at this time  Skin:  Skin Assessment: Skin Integrity Issues: Skin Integrity Issues::  (2nd toe right foot cellutitis)  Last BM:  9/14  Height:   Ht Readings from Last 1 Encounters:  02/07/20 5\' 5"  (1.651 m)    Weight:   Wt Readings from Last 1 Encounters:  02/07/20 105.9 kg    Ideal Body Weight:  56.8 kg  BMI:  Body mass index is 38.85 kg/m.  Estimated Nutritional Needs:   Kcal:  0258-5277 (12-14 kcal/kg)  Protein:  114 (2g/kg/IBW)  Fluid:  >/= 1.2 L   Lajuan Lines, RD, LDN Clinical Nutrition After Hours/Weekend Pager # in [place]

## 2020-02-07 NOTE — Progress Notes (Signed)
[facility]  Looking better today. More awake. Wounds look better/ petechial rash on leg remains. Xerofrom changed today on legs but will leave in place and treat like a burn wound for now.  Bactracin to the 2nd toe right foot and cover with xeroform gauze; LEAVE IN PLACE XEROFORM ON THE LEG- THIS WILL DRY OUT AND BE LEFT IN PLACE, wrap leg and foot with kerlix, gauze between toes, ACE wrap from foot to knee.  Hopefully can get extubated. Discussed with Dr. Wynetta Emery.   Curlene Labrum, MD [facility] [address] [place], [place] [address] [phone] (office)

## 2020-02-07 NOTE — Progress Notes (Signed)
ANTICOAGULATION CONSULT NOTE - Follow Up Consult  Pharmacy Consult for heparin Indication: atrial fibrillation  Labs: Recent Labs    02/04/20 0814 02/05/20 0424 02/05/20 0424 02/05/20 0725 02/06/20 1040 02/06/20 1943 02/07/20 0614  HGB  --  12.5   < >  --  10.2*  --  9.7*  HCT  --  43.2  --   --  35.4*  --  32.8*  PLT  --  211  --   --  194  --  179  HEPARINUNFRC   < >  --   --  0.34  --  0.26* 0.35  CREATININE  --  0.34*  --   --  0.38*  --  0.39*   < > = values in this interval not displayed.    Assessment/Plan:  64yo female therapeutic on heparin after rate change. Will continue gtt at current rate and monitor daily level.   Wynona Neat, PharmD, BCPS  02/07/2020,7:06 AM

## 2020-02-07 NOTE — Progress Notes (Signed)
ANTICOAGULATION CONSULT NOTE - [place] for Heparin Indication: atrial fibrillation  Allergies  Allergen Reactions  . Bactrim [Sulfamethoxazole-Trimethoprim] Anaphylaxis  . Amoxicillin Hives  . Diclofenac Tinitus    swelling in feet  . Morphine And Related Nausea And Vomiting and Other (See Comments)    headache  . Potassium-Containing Compounds Itching    Patient Measurements: Height: 5\' 5"  (165.1 cm) Weight: 105.9 kg (233 lb 7.5 oz) IBW/kg (Calculated) : 57 Heparin Dosing Weight:  80 kg  Vital Signs: Temp: 98.5 F (36.9 C) (09/17 0750) Temp Source: Rectal (09/17 0750) BP: 142/59 (09/17 0700) Pulse Rate: 87 (09/17 0750)  Labs: Recent Labs    02/05/20 0424 02/05/20 0424 02/05/20 0725 02/06/20 1040 02/06/20 1943 02/07/20 0614  HGB 12.5   < >  --  10.2*  --  9.7*  HCT 43.2  --   --  35.4*  --  32.8*  PLT 211  --   --  194  --  179  HEPARINUNFRC  --   --  0.34  --  0.26* 0.35  CREATININE 0.34*  --   --  0.38*  --  0.39*   < > = values in this interval not displayed.    Estimated Creatinine Clearance: 87 mL/min (A) (by C-G formula based on SCr of 0.39 mg/dL (L)).  Assessment: Pharmacy was consulted to dose heparin in 64 yr old female with atrial fibrillation.  Patient was not on anticoagulation prior to admission. 9/16 Heparin held for debridement of toe in OR. Surgeon okay to restart at noon. HL 0.35 this AM CBC WNL   Goal of Therapy:  Heparin level 0.3-0.7 units/ml Monitor platelets by anticoagulation protocol: Yes   Plan:  Continue heparin infusion rate at 2350 units/hr  Monitor daily heparin level, CBC Monitor for signs/symptoms of bleeding  Isac Sarna, BS [place], BCPS Clinical Pharmacist Pager [phone] 02/07/2020 8:55 AM

## 2020-02-07 NOTE — Progress Notes (Signed)
NAME:  [name] [name], MRN:  [id number], DOB:  22-May-1956, LOS: 66 ADMISSION DATE:  01/26/2020, CONSULTATION DATE:  01/28/20 REFERRING MD:  Ortiz/ Triad, CHIEF COMPLAINT:  resp distress    Brief History   64 yo female smoker presented 9/05 with Rt foot pain and swelling.  This was associated with chills, myalgias from sepsis with cellulitis.  Developed A fib with RV and then hypoxia.  Required intubation 9/07 and PCCM consulted.  Past Medical History  COPD on home oxygen, HTN, DM, Bipolar  Significant Hospital Events   9/05 Admit 9/07 intubated 9/08 back in sinus rhythm 9/14 fever, Abx changed 9/16 debridement of Rt 2nd toe with evacuation of abscess, superficial debridement of Rt lung with punch biopsy x 2 9/17 Extubate  Consults:  Cardiology Surgery Palliative care  Procedures:  ETT 9/07 >> 9/17 Lt PICC 9/12 >>   Significant Diagnostic Tests:   Echo 9/08 >> EF 55 to 60%  CT Rt leg 9/11 >> diffuse subcutaneous soft tissue swelling, moderate myofasciitis involving mid to lower calf, no osteomyelitis or septic arthritis  Micro Data:  COVID 9/05 >> negative MRSA PCR 9/08 >> negative Blood 9/05 >> Group B Streptococcus Blood 9/07 >> negative  Antimicrobials:  Vancomycin 9/05 Rocephin 9/05 Ancef 9/06 >> 9/10 Vancomycin 9/11 >>  Clindamycin 9/14 >>  Ancef 9/15 >>   Interim history/subjective:  Pressure support.  Objective   Blood pressure (!) 107/48, pulse 79, temperature 99.1 F (37.3 C), temperature source Rectal, resp. rate 18, height 5\' 5"  (1.651 m), weight 105.9 kg, SpO2 94 %.    Vent Mode: CPAP FiO2 (%):  [40 %-60 %] 45 % Set Rate:  [16 bmp] 16 bmp Vt Set:  [450 mL] 450 mL PEEP:  [5 cmH20] 5 cmH20 Pressure Support:  [5 cmH20-10 cmH20] 10 cmH20 Plateau Pressure:  [17 cmH20-24 cmH20] 17 cmH20   Intake/Output Summary (Last 24 hours) at 02/07/2020 1315 Last data filed at 02/07/2020 0748 Gross per 24 hour  Intake 1049.72 ml  Output 455 ml  Net 594.72 ml     Filed Weights   02/05/20 0500 02/06/20 0500 02/07/20 0500  Weight: 104.8 kg 105.1 kg 105.9 kg    Examination:  General - sedated Eyes - pupils reactive ENT - ETT in place Cardiac - regular rate/rhythm, no murmur Chest - better air movement on Rt, no wheeze Abdomen - soft, non tender, + bowel sounds Extremities - 1+ edema Skin - Rt foot in wrap Neuro - RASS -1, follows commands  Resolved Hospital Problem list      Assessment & Plan:   Acute on chronic hypoxic/hypercapnic respiratory failure from acute pulmonary edema in setting of A fib with RVR, COPD exacerbation, and mucus plugging. - extubation trial 9/17 - goal SpO2 > 92% - f/u CXR intermittently - scheduled BDs  Group B Streptococcal bacteremia 2nd to Rt lower leg cellulitis. - ABx per primary team - s/p debridement on 9/16; post op care per surgery and wound care  Transient A fib with RVR. - back in sinus rhythm - monitor on telemetry  Acute metabolic encephalopathy from sepsis, hypoxia. Hx of ETOH, depression, anxiety, seizures. - CIWA q4h with prn ativan after extubation - if needed could also add precedex  Goals of care. - palliative care consulted - DNR  Dysphagia. - speech therapy to assess swallowing  Deconditioning. - PT/OT to assess  Best practice:  Diet: NPO DVT prophylaxis: heparin gtt GI prophylaxis: protonix Mobility: bed rest Code Status: DNR Disposition: ICU  Labs    CMP Latest Ref Rng & Units 02/07/2020 02/06/2020 02/05/2020  Glucose 70 - 99 mg/dL 131(H) 117(H) 130(H)  BUN 8 - 23 mg/dL 29(H) 20 23  Creatinine 0.44 - 1.00 mg/dL 0.39(L) 0.38(L) 0.34(L)  Sodium 135 - 145 mmol/L 139 135 136  Potassium 3.5 - 5.1 mmol/L 4.0 4.4 4.9  Chloride 98 - 111 mmol/L 93(L) 90(L) 90(L)  CO2 22 - 32 mmol/L 38(H) 35(H) 35(H)  Calcium 8.9 - 10.3 mg/dL 8.5(L) 8.6(L) 8.9  Total Protein 6.5 - 8.1 g/dL 6.3(L) 6.3(L) -  Total Bilirubin 0.3 - 1.2 mg/dL 0.8 1.4(H) -  Alkaline Phos 38 - 126 U/L 116  131(H) -  AST 15 - 41 U/L 14(L) 19 -  ALT 0 - 44 U/L 22 29 -    CBC Latest Ref Rng & Units 02/07/2020 02/06/2020 02/05/2020  WBC 4.0 - 10.5 K/uL 7.4 11.9(H) 14.0(H)  Hemoglobin 12.0 - 15.0 g/dL 9.7(L) 10.2(L) 12.5  Hematocrit 36 - 46 % 32.8(L) 35.4(L) 43.2  Platelets 150 - 400 K/uL 179 194 211    ABG    Component Value Date/Time   PHART 7.412 02/02/2020 1352   PCO2ART 67.7 (HH) 02/02/2020 1352   PO2ART 98.0 02/02/2020 1352   HCO3 38.8 (H) 02/02/2020 1352   O2SAT 97.3 02/02/2020 1352    CBG (last 3)  Recent Labs    02/07/20 0345 02/07/20 0750 02/07/20 1153  GLUCAP 116* 116* 109*    Critical care time: 32 minutes  Chesley Mires, MD [place] Pager - [phone] 02/07/2020, 1:15 PM

## 2020-02-07 NOTE — Progress Notes (Signed)
PROGRESS NOTE    [name]  MAY:[id number] DOB: [birth year] DOA: 01/26/2020 PCP: Leonard Downing, MD   Brief Narrative:  Per HPI: [name] [name] a 64 y.o.femalewith medical history significant forCOPD on 2L [place] at bedtime, hypertension, type 2 diabetes, obesity, bipolar disorder/anxiety disorder, ongoing tobacco use,questionable ongoing alcohol abuse,and prior polysubstance abuse with methamphetamine and alcohol who presented to the ED with progressive worsening of pain, erythema, and serosanguineous drainage from her right second toe. This began to gradually develop after her daughter trimmed her nail too close to her skin approximately 2 weeks ago. She has noticed some fever, chills, body aches, and mild headache as well as general malaise at home. She tried soaking her foot at home with no improvement in her symptoms. She denies any chest pain, shortness of breath, abdominal pain, nausea, vomiting, or dysuria. She denies any upper respiratory symptoms as well. She has not been vaccinated for COVID-19.  ED Course:Vital signs demonstrate ongoing sinus tachycardia and this is confirmed on EKG with heart rate approximately 110 bpm. Temperature 1-1.5 Fahrenheit. She is noted to have a leukocytosis of 12,900 as well as a thrombocytopenia of 129,000. BUN is 38 and creatinine is elevated at 1.1 with baseline 0.3-0.5. Chest x-ray with mild cardiomegaly and no other acute findings. Covid testing negative. Right foot x-ray with no soft tissue swelling or signs of osteomyelitis noted. Lactic acid is 1.5. Patient has been ordered and started 3 L fluid bolus as well as vancomycin and Rocephin for suspected sepsis secondary to cellulitis.  9/14: Patient remains sedated on ventilator this morning with PCCM following.  She is started to have some signs of ileus.  Tube feedings have been discontinued.  Plan to restart today and see if this is tolerated.  Try bisacodyl suppository  as well.  Continue IV antibiotics with vancomycin to treat group B strep bacteremia as well as what appears to be worsening cellulitis and mild fasciitis to right lower extremity.  TEE suspended until patient's clinical condition further improved.  9/15: Pt remains on ventilator.  Concerning right leg lesions and toe lesion, surgery consult requested.  Palliative medicine consulted for goals of care discussions.    9/16:  Pt attempting to pull out ET tube.    Assessment & Plan:   Active Problems:   Acute respiratory failure with hypoxia and hypercapnia (HCC)   Sepsis (HCC)   Cellulitis of right lower leg   Atrial fibrillation with RVR (HCC)   Pleural effusion on right   Leukocytosis   Gangrene of toe of right foot (HCC)   Petechial rash   Toe infection   Endotracheally intubated   Palliative care by specialist   Goals of care, counseling/discussion   DNR (do not resuscitate)  1-sepsis: In the setting of Group A Streptococcus cellulitis/bacteremia. Present on admission -Patient met sepsis criteria on admission with sinus tachycardia, elevated white blood cells, febrile and with identified source of infection of cellulitic process. She had acute kidney injury as organ dysfunction. -No signs of osteomyelitis onfootchest x-ray -Blood culture positive for Group A Streptococcus -Continue on cefazolin IV.  -Transthoracic echocardiogram with LVEF 55 to 60% and no vegetations noted, appreciate cardiology consultation for TEEonce extubated -CT right lower extremity on 9/11 with findings of diffuse soft tissue edema and moderate fasciitis, no abscess or fluid collection noted. -Continue as needed antipyretics and supportive care.  Right 2nd toe infection and right leg with necrotic black areas concerning and I have asked for a surgical consultation.  -POD#1  s/p OR debridement and skin biopsy by Dr. Constance Haw. Follow up pathology.    2-acute on chronic respiratory failure with hypoxia and  hypercapnias/p intubation -Appears to be a combination of COPD exacerbation, Underlying obesity hypoventilation syndrome and component of interstitial pulmonary edema/vascular congestion after fluid resuscitation as per sepsis protocol. -Worsening hypoxemia on 9/12 that has now improved after diuresis, holding further Lasix for now due to poor intake -Palliative team met with family on 9/16.  Try to wean vent 9/17.   -PCCM following with further recommendations appreciated  3-acute kidney injury-resolved -Baseline creatinine 0.6-0.7, currently around0.4 -Monitor labs closely with Lasix for diuresis prn -Continue to monitor in a.m. -May need to start IV fluid if patient cannot tolerate tube feeds.  4-mild thrombocytopenia-resolved -Likely related to acute sepsis and underlying history of alcohol abuse -No signs of overt bleeding -Monitor carefully while on heparin drip -Follow CBC.  5-alcohol abuse -CIWA monitoring -Continue thiamine and folic acid.  6-class II obesity  -Body mass index is 36.58 kg/m. -Low calorie diet, portion control and increase physical activity discussed with patient.  7-history of hypertension-stable -Monitor closely with IV Lasix ordered -Clonidine 3 times daily per PCCM -Hydrocortisone discontinued. -Continue oral Cardizem -Labetalol as needed  8-history of tobacco abuse -Cessation counseling has been provided. -Continue nicotine patch.  9-depression/anxiety/history of seizures -Plan is to resume psychotropic home medications once able to take by mouth. -Continue Depakote  10-type 2 diabetes mellitus -A1c 5.8 -While inpatient continue sliding scale insulin -At discharge will resume the use of Metformin in order to continue assisting with management of insulin resistant and obesity. -Modified carbohydrate diet and lifestylerecommended after discharge.  CBG (last 3)  Recent Labs    02/07/20 0345 02/07/20 0750 02/07/20 1153   GLUCAP 116* 116* 109*   11-new onset atrial fibrillation with RVR-currently in sinus rhythm -Continue oral Cardizem as ordered -TSH 0.294, check freeT3 0.7, T4 0.75 -2D echocardiogram with LVEF 55-60% -CHA2DS2-VASc score of 3 for which heparin driphad been started, may not require anticoagulation on discharge if this appears to simply be an isolated event. -Appreciatecardiology recommendations for eventual TEE once extubated  12-Hypokalemia-resolved -Monitor  13-ileus with poor tolerance of tube feeds -Trial of bisacodyl suppository today -Maintain magnesium and potassium of 2 and 4 respectively -Pt continues to vomit tube feeds.  -Pt had BM 9/17.  Try to restart trickle tube feeds 9/17.     DVT prophylaxis:Heparin drip Code Status:DNR after discussion with both daughters they were agreeable to DNR status Family Communication:Discussed with daughter at bedside9/14, 9/15, 9/16, 9/17 daughter [name] [phone] Disposition Plan: Status is: Inpatient  Remains inpatient appropriate because:Ongoing diagnostic testing needed not appropriate for outpatient work up, IV treatments appropriate due to intensity of illness or inability to take PO and Inpatient level of care appropriate due to severity of illness.  Palliative medicine consulted for goals of care discussions.   Dispo: The patient is from:Home Anticipated d/c is to:SNF Anticipated d/c date is: > 3 days Patient currently is not medically stable to d/c.Patient currently remains intubated on the ventilator and will need further evaluation per cardiology along with ongoing evaluation per PCCM.  Consultants:  PCCM  Cardiology  Procedures:  2D echocardiogram 9/8 with LVEF 55-60%. No vegetations.  Antimicrobials:  Anti-infectives (From admission, onward)   Start     Dose/Rate Route Frequency Ordered Stop   02/05/20 2200  ceFAZolin (ANCEF) IVPB 2g/100 mL premix         2 g 200 mL/hr over 30 Minutes Intravenous  Every 8 hours 02/05/20 1309     02/04/20 2200  clindamycin (CLEOCIN) IVPB 600 mg        600 mg 100 mL/hr over 30 Minutes Intravenous Every 8 hours 02/04/20 2018 02/09/20 0714   02/02/20 1100  vancomycin (VANCOREADY) IVPB 1500 mg/300 mL  Status:  Discontinued        1,500 mg 150 mL/hr over 120 Minutes Intravenous Every 24 hours 02/01/20 1225 02/05/20 1308   02/01/20 1130  vancomycin (VANCOREADY) IVPB 2000 mg/400 mL        2,000 mg 200 mL/hr over 120 Minutes Intravenous  Once 02/01/20 1108 02/01/20 2010   01/27/20 1500  vancomycin (VANCOREADY) IVPB 1500 mg/300 mL  Status:  Discontinued        1,500 mg 150 mL/hr over 120 Minutes Intravenous Every 24 hours 01/26/20 1428 01/27/20 0827   01/27/20 0830  ceFAZolin (ANCEF) IVPB 2g/100 mL premix  Status:  Discontinued        2 g 200 mL/hr over 30 Minutes Intravenous Every 8 hours 01/27/20 0827 02/01/20 1222   01/26/20 1545  vancomycin (VANCOCIN) IVPB 1000 mg/200 mL premix  Status:  Discontinued        1,000 mg 200 mL/hr over 60 Minutes Intravenous  Once 01/26/20 1541 01/26/20 1553   01/26/20 1415  vancomycin (VANCOCIN) IVPB 1000 mg/200 mL premix  Status:  Discontinued        1,000 mg 200 mL/hr over 60 Minutes Intravenous  Once 01/26/20 1408 01/26/20 1413   01/26/20 1415  cefTRIAXone (ROCEPHIN) 2 g in sodium chloride 0.9 % 100 mL IVPB        2 g 200 mL/hr over 30 Minutes Intravenous  Once 01/26/20 1408 01/26/20 2011   01/26/20 1415  vancomycin (VANCOREADY) IVPB 2000 mg/400 mL        2,000 mg 200 mL/hr over 120 Minutes Intravenous  Once 01/26/20 1413 01/26/20 2011      Subjective: Patient remains sedated on vent.    Objective: Vitals:   02/07/20 0900 02/07/20 1000 02/07/20 1100 02/07/20 1152  BP: (!) 119/50 (!) 130/47 (!) 107/48   Pulse: 84 93 79 79  Resp: 16 (!) '21 18 18  ' Temp:    99.1 F (37.3 C)  TempSrc:    Rectal  SpO2: 96% 94% 94% 94%  Weight:      Height:        Intake/Output  Summary (Last 24 hours) at 02/07/2020 1228 Last data filed at 02/07/2020 0748 Gross per 24 hour  Intake 1049.72 ml  Output 455 ml  Net 594.72 ml   Filed Weights   02/05/20 0500 02/06/20 0500 02/07/20 0500  Weight: 104.8 kg 105.1 kg 105.9 kg    Examination:  General exam: Appears sedated on ventilator Respiratory system: Coarse bilaterally.  Intubated on FiO2 40% Cardiovascular system: S1 & S2 heard. Gastrointestinal system: Abdomen minimally distended, bowel sounds present. Central nervous system: Sedated on ventilator Extremities: necrotic black areas on right leg with right 2nd toe with purulent drainage seen. Skin: wounds clean and dry and intact.  As above Psychiatry: Cannot be assessed  Data Reviewed: I have personally reviewed following labs and imaging studies  CBC: Recent Labs  Lab 02/03/20 0611 02/04/20 0814 02/05/20 0424 02/06/20 1040 02/07/20 0614  WBC 8.9 9.1 14.0* 11.9* 7.4  NEUTROABS  --   --   --  10.8* 6.1  HGB 10.8* 12.2 12.5 10.2* 9.7*  HCT 37.6 42.6 43.2 35.4* 32.8*  MCV 97.2 99.3 95.8 97.8 96.2  PLT 181 187 211 194 616   Basic Metabolic Panel: Recent Labs  Lab 02/03/20 0611 02/04/20 0814 02/05/20 0424 02/06/20 1040 02/07/20 0614  NA 142 140 136 135 139  K 4.1 4.2 4.9 4.4 4.0  CL 92* 90* 90* 90* 93*  CO2 41* 38* 35* 35* 38*  GLUCOSE 180* 114* 130* 117* 131*  BUN 28* 27* 23 20 29*  CREATININE 0.36* 0.42* 0.34* 0.38* 0.39*  CALCIUM 9.2 9.0 8.9 8.6* 8.5*  MG 1.7 2.2 1.9  --   --    GFR: Estimated Creatinine Clearance: 87 mL/min (A) (by C-G formula based on SCr of 0.39 mg/dL (L)). Liver Function Tests: Recent Labs  Lab 02/06/20 1040 02/07/20 0614  AST 19 14*  ALT 29 22  ALKPHOS 131* 116  BILITOT 1.4* 0.8  PROT 6.3* 6.3*  ALBUMIN 2.1* 2.1*   No results for input(s): LIPASE, AMYLASE in the last 168 hours. No results for input(s): AMMONIA in the last 168 hours. Coagulation Profile: No results for input(s): INR, PROTIME in the last  168 hours. Cardiac Enzymes: No results for input(s): CKTOTAL, CKMB, CKMBINDEX, TROPONINI in the last 168 hours. BNP (last 3 results) No results for input(s): PROBNP in the last 8760 hours. HbA1C: No results for input(s): HGBA1C in the last 72 hours. CBG: Recent Labs  Lab 02/06/20 2045 02/06/20 2344 02/07/20 0345 02/07/20 0750 02/07/20 1153  GLUCAP 130* 147* 116* 116* 109*   Lipid Profile: No results for input(s): CHOL, HDL, LDLCALC, TRIG, CHOLHDL, LDLDIRECT in the last 72 hours. Thyroid Function Tests: No results for input(s): TSH, T4TOTAL, FREET4, T3FREE, THYROIDAB in the last 72 hours. Anemia Panel: No results for input(s): VITAMINB12, FOLATE, FERRITIN, TIBC, IRON, RETICCTPCT in the last 72 hours. Sepsis Labs: No results for input(s): PROCALCITON, LATICACIDVEN in the last 168 hours.  Recent Results (from the past 240 hour(s))  Culture, blood (Routine X 2) w Reflex to ID Panel     Status: None   Collection Time: 01/28/20  1:01 PM   Specimen: BLOOD RIGHT HAND  Result Value Ref Range Status   Specimen Description BLOOD RIGHT HAND  Final   Special Requests   Final    BOTTLES DRAWN AEROBIC ONLY Blood Culture results may not be optimal due to an inadequate volume of blood received in culture bottles   Culture   Final    NO GROWTH 6 DAYS Performed at [facility], [address] [place], [place] [address]    Report Status 02/03/2020 FINAL  Final  Culture, blood (Routine X 2) w Reflex to ID Panel     Status: None   Collection Time: 01/28/20  1:01 PM   Specimen: BLOOD RIGHT WRIST  Result Value Ref Range Status   Specimen Description BLOOD RIGHT WRIST  Final   Special Requests   Final    BOTTLES DRAWN AEROBIC AND ANAEROBIC Blood Culture adequate volume   Culture   Final    NO GROWTH 6 DAYS Performed at [facility], [address], [place], [place] [address]    Report Status 02/03/2020 FINAL  Final  MRSA PCR Screening     Status: None   Collection Time: 01/29/20 11:17 AM    Specimen: Nasal Mucosa; Nasopharyngeal  Result Value Ref Range Status   MRSA by PCR NEGATIVE NEGATIVE Final    Comment:        The GeneXpert MRSA Assay (FDA approved for NASAL specimens only), is one component of a comprehensive MRSA colonization surveillance program. It is not  intended to diagnose MRSA infection nor to guide or monitor treatment for MRSA infections. Performed at [facility], [address], [place], [place] [address]   Urine Culture     Status: None   Collection Time: 02/01/20  8:40 PM   Specimen: Urine, Clean Catch  Result Value Ref Range Status   Specimen Description   Final    URINE, CLEAN CATCH Performed at [facility], [address] [place], [place] [address]    Special Requests   Final    NONE Performed at [facility], [address] [place], [place] [address]    Culture   Final    NO GROWTH Performed at [facility], [place] [address] [place], [place] [address]    Report Status 02/03/2020 FINAL  Final    Radiology Studies: DG Chest Port 1 View  Result Date: 02/06/2020 CLINICAL DATA:  Respiratory distress. EXAM: PORTABLE CHEST 1 VIEW COMPARISON:  02/05/2020 FINDINGS: The endotracheal tube and NG tubes are in good position, unchanged. The cardiac silhouette, mediastinal and hilar contours are stable. Persistent right lower lobe airspace consolidation/pneumonia and parapneumonic effusion. The left lung remains relatively clear. IMPRESSION: 1. Stable support apparatus. 2. Persistent right lower lobe airspace consolidation and parapneumonic effusion. Electronically Signed   By: Marijo Sanes M.D.   On: 02/06/2020 07:36   DG Abd Portable 1V  Result Date: 02/07/2020 CLINICAL DATA:  OG tube placement. EXAM: PORTABLE ABDOMEN - 1 VIEW COMPARISON:  02/03/2020 FINDINGS: The tip of an enteric tube projects over the gastric body with side hole also below the diaphragm. Mild gaseous distension of small bowel loops in the left hemiabdomen has decreased from the  prior study with the lower abdomen and pelvis not included on today's examination. Consolidation in the right lung base and a right pleural effusion were more fully evaluated on yesterday's chest radiographs. IMPRESSION: Enteric tube in the stomach. Electronically Signed   By: Logan Bores M.D.   On: 02/07/2020 10:15   CT EXTREMITY LOWER RIGHT W CONTRAST  Result Date: 02/05/2020 CLINICAL DATA:  Cellulitis of the lower extremity fever and body aches EXAM: CT OF THE LOWER RIGHT EXTREMITY WITH CONTRAST TECHNIQUE: Multidetector CT imaging of the lower right extremity was performed according to the standard protocol following intravenous contrast administration. COMPARISON:  None. CONTRAST:  29m OMNIPAQUE IOHEXOL 300 MG/ML  SOLN FINDINGS: Bones/Joint/Cartilage No fracture or dislocation. No areas of cortical destruction or periosteal reaction. Ill-defined sclerotic lesion within the tibial metadiaphysis, likely benign fibro-osseous lesion. 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 A small knee joint effusion is. Ligaments Suboptimally assessed by CT. Muscles and Tendons The muscles surrounding the lower extremity are intact without focal atrophy or tear. The visualized portions of the tendons are intact. Soft tissues Extensive subcutaneous edema seen surrounding the lower extremity extending to the deep fascial layers. Non loculated fluid seen within the subcutaneous soft tissues. No large loculated collections or subcutaneous emphysema. Overlying skin thickening is seen. IMPRESSION: Findings suggestive of diffuse extensive cellulitis involving the lower extremity. Non loculated fluid/phlegmon within the subcutaneous tissues. No evidence abscess or osteomyelitis. Electronically Signed   By: BPrudencio PairM.D.   On: 02/05/2020 17:39   Scheduled Meds: . arformoterol  15 mcg Nebulization BID  . benztropine  0.5 mg Per Tube  BID  . budesonide (PULMICORT) nebulizer solution  0.5 mg Nebulization BID  . chlorhexidine gluconate (MEDLINE KIT)  15 mL Mouth Rinse BID  .  Chlorhexidine Gluconate Cloth  6 each Topical Daily  . cloNIDine  0.2 mg Per Tube TID  . diltiazem  60 mg Per Tube Q6H  . docusate  100 mg Per Tube BID  . feeding supplement (VITAL HIGH PROTEIN)  1,000 mL Per Tube Q24H  . folic acid  1 mg Per Tube Daily  . insulin aspart  0-9 Units Subcutaneous Q4H  . mouth rinse  15 mL Mouth Rinse 10 times per day  . mupirocin ointment   Nasal Daily  . nicotine  7 mg Transdermal Daily  . pantoprazole sodium  40 mg Per Tube Q24H  . polyethylene glycol  17 g Oral Daily  . saccharomyces boulardii  250 mg Per Tube BID  . thiamine  100 mg Oral Daily   Or  . thiamine  100 mg Intravenous Daily  . valproic acid  500 mg Per Tube BID   Continuous Infusions: . sodium chloride 10 mL/hr at 01/29/20 1401  .  ceFAZolin (ANCEF) IV 2 g (02/07/20 0509)  . clindamycin (CLEOCIN) IV 600 mg (02/07/20 0515)  . fentaNYL infusion INTRAVENOUS 200 mcg/hr (02/07/20 0400)  . heparin 2,350 Units/hr (02/07/20 1013)  . midazolam 3 mg/hr (02/07/20 0740)     LOS: 12 days   Critical Care Procedure Note Authorized and Performed by: Murvin Natal MD  Total Critical Care time:  37 mins Due to a high probability of clinically significant, life threatening deterioration, the patient required my highest level of preparedness to intervene emergently and I personally spent this critical care time directly and personally managing the patient.  This critical care time included obtaining a history; examining the patient, pulse oximetry; ordering and review of studies; arranging urgent treatment with development of a management plan; evaluation of patient's response of treatment; frequent reassessment; and discussions with other providers.  This critical care time was performed to assess and manage the high probability of imminent and life threatening  deterioration that could result in multi-organ failure.  It was exclusive of separately billable procedures and treating other patients and teaching time.    Irwin Brakeman, MD How to contact the [facility] Attending or Consulting provider [place] or covering provider during after hours [employer], for this patient?  1. Check the care team in [facility] and look for a) attending/consulting [facility] provider listed and b) the [facility] team listed 2. Log into [email or web address] and use [place]'s universal password to access. If you do not have the password, please contact the hospital operator. 3. Locate the [facility] provider you are looking for under [facility] and page to a number that you can be directly reached. 4. If you still have difficulty reaching the provider, please page the [facility] (Director on Call) for the Hospitalists listed on amion for assistance.   If 7PM-7AM, please contact night-coverage [email or web address] 02/07/2020, 12:27 PM

## 2020-02-07 NOTE — Progress Notes (Signed)
RT extubated patient at 1335 to 5L O2 via nasal cannula. Patient remained stable throughout, RR 24 HR 101 and SATs 93-94%. RN at bedside, no complications noted at this time. RT will continue to monitor and assess.

## 2020-02-07 NOTE — Progress Notes (Signed)
Extubated earlier today and initially did well.  Then developed stridor with increased WOB.  Tried giving racemic epinephrine and Bipap w/o improvement.  Dr. Wynetta Emery spoke with her daughters and they confirmed they are agreeable to reintubation and tracheostomy.  Patient reintubated w/o difficulty.  Dr. Wynetta Emery will contact surgery next week to arrange for tracheostomy.    Chesley Mires, MD [place] Pager - [phone] 02/07/2020, 3:13 PM

## 2020-02-07 NOTE — Progress Notes (Signed)
PT Cancellation Note  Patient Details Name: Shanecia Hoganson MRN: [id number] DOB: 1956-01-06   Cancelled Treatment:    Reason Eval/Treat Not Completed: Patient at procedure or test/unavailable; Patient with respiratory therapist and RN for treatment, will check back later if time permits for PT evaluation.  2:29 PM, 02/07/20 Mearl Latin PT, DPT Physical Therapist at [facility]

## 2020-02-07 NOTE — Plan of Care (Signed)
Pt re-intubated with 2mg  versed, 50mg  fent, 10mg  Etomidate & 70mg  Rocuronium. ET placed 23 @ lip. OG external measurement 61cm, verified via xray.

## 2020-02-08 LAB — CBC WITH DIFFERENTIAL/PLATELET
Abs Immature Granulocytes: 0.08 10*3/uL — ABNORMAL HIGH (ref 0.00–0.07)
Basophils Absolute: 0 10*3/uL (ref 0.0–0.1)
Basophils Relative: 0 %
Eosinophils Absolute: 0.1 10*3/uL (ref 0.0–0.5)
Eosinophils Relative: 1 %
HCT: 33.3 % — ABNORMAL LOW (ref 36.0–46.0)
Hemoglobin: 9.7 g/dL — ABNORMAL LOW (ref 12.0–15.0)
Immature Granulocytes: 1 %
Lymphocytes Relative: 16 %
Lymphs Abs: 1.2 10*3/uL (ref 0.7–4.0)
MCH: 28.6 pg (ref 26.0–34.0)
MCHC: 29.1 g/dL — ABNORMAL LOW (ref 30.0–36.0)
MCV: 98.2 fL (ref 80.0–100.0)
Monocytes Absolute: 0.5 10*3/uL (ref 0.1–1.0)
Monocytes Relative: 7 %
Neutro Abs: 5.8 10*3/uL (ref 1.7–7.7)
Neutrophils Relative %: 75 %
Platelets: 200 10*3/uL (ref 150–400)
RBC: 3.39 MIL/uL — ABNORMAL LOW (ref 3.87–5.11)
RDW: 14.8 % (ref 11.5–15.5)
WBC: 7.6 10*3/uL (ref 4.0–10.5)
nRBC: 0 % (ref 0.0–0.2)

## 2020-02-08 LAB — HEPARIN LEVEL (UNFRACTIONATED)
Heparin Unfractionated: 0.29 IU/mL — ABNORMAL LOW (ref 0.30–0.70)
Heparin Unfractionated: 0.42 IU/mL (ref 0.30–0.70)

## 2020-02-08 LAB — COMPREHENSIVE METABOLIC PANEL
ALT: 17 U/L (ref 0–44)
AST: 10 U/L — ABNORMAL LOW (ref 15–41)
Albumin: 2.3 g/dL — ABNORMAL LOW (ref 3.5–5.0)
Alkaline Phosphatase: 112 U/L (ref 38–126)
Anion gap: 8 (ref 5–15)
BUN: 30 mg/dL — ABNORMAL HIGH (ref 8–23)
CO2: 39 mmol/L — ABNORMAL HIGH (ref 22–32)
Calcium: 8.5 mg/dL — ABNORMAL LOW (ref 8.9–10.3)
Chloride: 93 mmol/L — ABNORMAL LOW (ref 98–111)
Creatinine, Ser: 0.37 mg/dL — ABNORMAL LOW (ref 0.44–1.00)
GFR calc Af Amer: >60 mL/min (ref >60)
GFR calc non Af Amer: >60 mL/min (ref >60)
Glucose, Bld: 118 mg/dL — ABNORMAL HIGH (ref 70–99)
Potassium: 3.5 mmol/L (ref 3.5–5.1)
Sodium: 140 mmol/L (ref 135–145)
Total Bilirubin: 0.6 mg/dL (ref 0.3–1.2)
Total Protein: 6.2 g/dL — ABNORMAL LOW (ref 6.5–8.1)

## 2020-02-08 LAB — GLUCOSE, CAPILLARY
Glucose-Capillary: 103 mg/dL — ABNORMAL HIGH (ref 70–99)
Glucose-Capillary: 106 mg/dL — ABNORMAL HIGH (ref 70–99)
Glucose-Capillary: 124 mg/dL — ABNORMAL HIGH (ref 70–99)
Glucose-Capillary: 130 mg/dL — ABNORMAL HIGH (ref 70–99)
Glucose-Capillary: 150 mg/dL — ABNORMAL HIGH (ref 70–99)
Glucose-Capillary: 156 mg/dL — ABNORMAL HIGH (ref 70–99)

## 2020-02-08 LAB — MAGNESIUM: Magnesium: 1.9 mg/dL (ref 1.7–2.4)

## 2020-02-08 LAB — PHOSPHORUS: Phosphorus: 2.8 mg/dL (ref 2.5–4.6)

## 2020-02-08 MED ORDER — FREE WATER
100.0000 mL | Status: DC
  Administered 2020-02-08 – 2020-02-13 (×29): 100 mL

## 2020-02-08 NOTE — Progress Notes (Signed)
ANTICOAGULATION CONSULT NOTE - [place] for Heparin Indication: atrial fibrillation  Allergies  Allergen Reactions  . Bactrim [Sulfamethoxazole-Trimethoprim] Anaphylaxis  . Amoxicillin Hives  . Diclofenac Tinitus    swelling in feet  . Morphine And Related Nausea And Vomiting and Other (See Comments)    headache  . Potassium-Containing Compounds Itching    Patient Measurements: Height: 5\' 5"  (165.1 cm) Weight: 105.9 kg (233 lb 7.5 oz) IBW/kg (Calculated) : 57 Heparin Dosing Weight:  80 kg  Vital Signs: Temp: 100 F (37.8 C) (09/18 1659) Temp Source: Rectal (09/18 1659) BP: 111/41 (09/18 1700) Pulse Rate: 73 (09/18 1700)  Labs: Recent Labs    02/06/20 1040 02/06/20 1943 02/07/20 0614 02/08/20 0442 02/08/20 0443 02/08/20 1617  HGB 10.2*  --  9.7* 9.7*  --   --   HCT 35.4*  --  32.8* 33.3*  --   --   PLT 194  --  179 200  --   --   HEPARINUNFRC  --    < > 0.35  --  0.29* 0.42  CREATININE 0.38*  --  0.39* 0.37*  --   --    < > = values in this interval not displayed.    Estimated Creatinine Clearance: 87 mL/min (A) (by C-G formula based on SCr of 0.37 mg/dL (L)).  Assessment: Pharmacy was consulted to dose heparin in 65 yr old female with atrial fibrillation.  Patient was not on anticoagulation prior to admission. 9/16 Heparin held for debridement of toe in OR. Surgeon okay to restart at noon. HL 0.42 this PM, therapeutic CBC WNL   Goal of Therapy:  Heparin level 0.3-0.7 units/ml Monitor platelets by anticoagulation protocol: Yes   Plan:  IContinue heparin infusion rate at 2450 units/hr  Monitor anti-Xa level  daily heparin level, CBC Monitor for signs/symptoms of bleeding  Isac Sarna, BS [place]ena [place], BCPS Clinical Pharmacist Pager [phone] 02/08/2020 5:43 PM

## 2020-02-08 NOTE — Progress Notes (Addendum)
PROGRESS NOTE    [name] [name]  FBP:[id number] DOB: Mar 02, 1956 DOA: 01/26/2020 PCP: [name] Downing, MD   Brief Narrative:  Per HPI: [name] [name] a 63 y.o.femalewith medical history significant forCOPD on 2L [place] at bedtime, hypertension, type 2 diabetes, obesity, bipolar disorder/anxiety disorder, ongoing tobacco use,questionable ongoing alcohol abuse,and prior polysubstance abuse with methamphetamine and alcohol who presented to the ED with progressive worsening of pain, erythema, and serosanguineous drainage from her right second toe. This began to gradually develop after her daughter trimmed her nail too close to her skin approximately 2 weeks ago. She has noticed some fever, chills, body aches, and mild headache as well as general malaise at home. She tried soaking her foot at home with no improvement in her symptoms. She denies any chest pain, shortness of breath, abdominal pain, nausea, vomiting, or dysuria. She denies any upper respiratory symptoms as well. She has not been vaccinated for COVID-19.  ED Course:Vital signs demonstrate ongoing sinus tachycardia and this is confirmed on EKG with heart rate approximately 110 bpm. Temperature 1-1.5 Fahrenheit. She is noted to have a leukocytosis of 12,900 as well as a thrombocytopenia of 129,000. BUN is 38 and creatinine is elevated at 1.1 with baseline 0.3-0.5. Chest x-ray with mild cardiomegaly and no other acute findings. Covid testing negative. Right foot x-ray with no soft tissue swelling or signs of osteomyelitis noted. Lactic acid is 1.5. Patient has been ordered and started 3 L fluid bolus as well as vancomycin and Rocephin for suspected sepsis secondary to cellulitis.  9/14: Patient remains sedated on ventilator this morning with PCCM following.  She is started to have some signs of ileus.  Tube feedings have been discontinued.  Plan to restart today and see if this is tolerated.  Try bisacodyl suppository  as well.  Continue IV antibiotics with vancomycin to treat group B strep bacteremia as well as what appears to be worsening cellulitis and mild fasciitis to right lower extremity.  TEE suspended until patient's clinical condition further improved.  9/15: Pt remains on ventilator.  Concerning right leg lesions and toe lesion, surgery consult requested.  Palliative medicine consulted for goals of care discussions.  Cardiology awaiting for patient to come off vent for consideration of TEE, remains on IV heparin for atrial fibrillation.   9/16:  Pt attempting to pull out ET tube.    9/17: Pt extubated briefly and then re-intubated, trickle tube feed restarted   9/18: Pt remains on vent. Family still wants to pursue trach. Partial code status.    Assessment & Plan:   Active Problems:   Acute respiratory failure with hypoxia and hypercapnia   Sepsis   Cellulitis of right lower leg   Atrial fibrillation with RVR    Pleural effusion on right   Leukocytosis   Gangrene of toe of right foot   Petechial rash   Toe infection   Endotracheally intubated   Palliative care by specialist   Goals of care, counseling/discussion    1-sepsis: In the setting of Group A Streptococcus cellulitis/bacteremia. Present on admission -Patient met sepsis criteria on admission with sinus tachycardia, elevated white blood cells, febrile and with identified source of infection of cellulitic process. She had acute kidney injury as organ dysfunction. -No signs of osteomyelitis onfootchest x-ray -Blood culture positive for Group A Streptococcus -Continue on cefazolin IV.  Completed clindamycin.  -Transthoracic echocardiogram with LVEF 55 to 60% and no vegetations noted, appreciate cardiology consultation for TEEonce extubated -CT right lower extremity on 9/11 with  findings of diffuse soft tissue edema and moderate fasciitis, no abscess or fluid collection noted. -Continue as needed antipyretics and supportive  care.  Right 2nd toe infection and right leg with necrotic black areas concerning and I have asked for a surgical consultation.  -POD#2 s/p OR debridement and skin biopsy by Dr. Constance Haw. Follow up pathology.    2-acute on chronic respiratory failure with hypoxia and hypercapnias/p intubation -Appears to be a combination of COPD exacerbation, Underlying obesity hypoventilation syndrome and component of interstitial pulmonary edema/vascular congestion after fluid resuscitation as per sepsis protocol. -Worsening hypoxemia on 9/12 that has now improved after diuresis, holding further Lasix for now due to poor intake -Palliative team met with family on 9/16.  Try to wean vent 9/17.   -PCCM following with further recommendations appreciated  3-acute kidney injury-resolved -Baseline creatinine 0.6-0.7, currently around0.4 -Monitor labs closely with Lasix for diuresis prn -Continue to monitor in a.m.  4-mild thrombocytopenia-resolved -Likely related to acute sepsis and underlying history of alcohol abuse -No signs of overt bleeding -Monitor carefully while on heparin drip -Follow CBC.  5-alcohol abuse -CIWA monitoring -Continue thiamine and folic acid.  6-class II obesity  -Body mass index is 36.58 kg/m. -Low calorie diet, portion control and increase physical activity discussed with patient.  7-history of hypertension-stable -Monitor closely with IV Lasix ordered -Clonidine 3 times daily per PCCM -Hydrocortisone discontinued. -Continue oral Cardizem -Labetalol as needed  8-history of tobacco abuse -Cessation counseling has been provided. -Continue nicotine patch.  9-depression/anxiety/history of seizures -Plan is to resume psychotropic home medications once able to take by mouth. -Continue Depakote  10-type 2 diabetes mellitus -A1c 5.8 -While inpatient continue sliding scale insulin -At discharge will resume the use of Metformin in order to continue assisting with  management of insulin resistant and obesity. -Modified carbohydrate diet and lifestylerecommended after discharge.  CBG (last 3)  Recent Labs    02/08/20 0742 02/08/20 1216 02/08/20 1524  GLUCAP 124* 156* 150*   11-new onset atrial fibrillation with RVR-currently in sinus rhythm -Continue oral Cardizem as ordered -TSH 0.294, check freeT3 0.7, T4 0.75 -2D echocardiogram with LVEF 55-60% -CHA2DS2-VASc score of 3 for which heparin driphad been started, may not require anticoagulation on discharge if this appears to simply be an isolated event. -Appreciatecardiology recommendations for eventual TEE once extubated  12-Hypokalemia-resolved -Monitor  13-ileus with poor tolerance of tube feeds -Trial of bisacodyl suppository today -Maintain magnesium and potassium of 2 and 4 respectively -Pt continues to vomit tube feeds.  -Pt had BM 9/17.  Try to restart trickle tube feeds 9/17.     DVT prophylaxis:Heparin drip Code Status:Partial  Family Communication:Discussed with daughter at bedside9/14, 9/15, 9/16, 9/17 daughter [name] [phone], 9/18 daughter [name] at bedside Disposition Plan: Status is: Inpatient  Remains inpatient appropriate because:Ongoing diagnostic testing needed not appropriate for outpatient work up, IV treatments appropriate due to intensity of illness or inability to take PO and Inpatient level of care appropriate due to severity of illness.  Palliative medicine consulted for goals of care discussions.   Dispo: The patient is from:Home Anticipated d/c is to:SNF Anticipated d/c date is: > 3 days Patient currently is not medically stable to d/c.Patient currently remains intubated on the ventilator and will need further evaluation per cardiology along with ongoing evaluation per PCCM.  Consultants:  PCCM  Cardiology  Procedures:  2D echocardiogram 9/8 with LVEF 55-60%. No  vegetations.  Antimicrobials:  Anti-infectives (From admission, onward)   Start     Dose/Rate Route Frequency  Ordered Stop   02/05/20 2200  ceFAZolin (ANCEF) IVPB 2g/100 mL premix        2 g 200 mL/hr over 30 Minutes Intravenous Every 8 hours 02/05/20 1309     02/04/20 2200  clindamycin (CLEOCIN) IVPB 600 mg        600 mg 100 mL/hr over 30 Minutes Intravenous Every 8 hours 02/04/20 2018 02/09/20 0714   02/02/20 1100  vancomycin (VANCOREADY) IVPB 1500 mg/300 mL  Status:  Discontinued        1,500 mg 150 mL/hr over 120 Minutes Intravenous Every 24 hours 02/01/20 1225 02/05/20 1308   02/01/20 1130  vancomycin (VANCOREADY) IVPB 2000 mg/400 mL        2,000 mg 200 mL/hr over 120 Minutes Intravenous  Once 02/01/20 1108 02/01/20 2010   01/27/20 1500  vancomycin (VANCOREADY) IVPB 1500 mg/300 mL  Status:  Discontinued        1,500 mg 150 mL/hr over 120 Minutes Intravenous Every 24 hours 01/26/20 1428 01/27/20 0827   01/27/20 0830  ceFAZolin (ANCEF) IVPB 2g/100 mL premix  Status:  Discontinued        2 g 200 mL/hr over 30 Minutes Intravenous Every 8 hours 01/27/20 0827 02/01/20 1222   01/26/20 1545  vancomycin (VANCOCIN) IVPB 1000 mg/200 mL premix  Status:  Discontinued        1,000 mg 200 mL/hr over 60 Minutes Intravenous  Once 01/26/20 1541 01/26/20 1553   01/26/20 1415  vancomycin (VANCOCIN) IVPB 1000 mg/200 mL premix  Status:  Discontinued        1,000 mg 200 mL/hr over 60 Minutes Intravenous  Once 01/26/20 1408 01/26/20 1413   01/26/20 1415  cefTRIAXone (ROCEPHIN) 2 g in sodium chloride 0.9 % 100 mL IVPB        2 g 200 mL/hr over 30 Minutes Intravenous  Once 01/26/20 1408 01/26/20 2011   01/26/20 1415  vancomycin (VANCOREADY) IVPB 2000 mg/400 mL        2,000 mg 200 mL/hr over 120 Minutes Intravenous  Once 01/26/20 1413 01/26/20 2011      Subjective: Patient remains sedated on vent.    Objective: Vitals:   02/08/20 1100 02/08/20 1200 02/08/20 1300 02/08/20 1400  BP: (!) 98/41 (!)  95/41 (!) 101/42 (!) 98/45  Pulse: 69 68 66 67  Resp: _0 Temp:      TempSrc:      SpO2: 92% 90% 94% 95%  Weight:      Height:        Intake/Output Summary (Last 24 hours) at 02/08/2020 1651 Last data filed at 02/08/2020 1500 Gross per 24 hour  Intake 6378.47 ml  Output --  Net 6378.47 ml   Filed Weights   02/05/20 0500 02/06/20 0500 02/07/20 0500  Weight: 104.8 kg 105.1 kg 105.9 kg    Examination:  General exam: Appears sedated on ventilator Respiratory system: Coarse bilaterally.  Intubated on FiO2 40% Cardiovascular system: S1 & S2 heard. Gastrointestinal system: Abdomen minimally distended, bowel sounds present. Central nervous system: Sedated on ventilator Extremities: necrotic black areas on right leg with right 2nd toe with purulent drainage seen. Skin: wounds clean and dry and intact.  As above Psychiatry: Pt medically sedated.   Data Reviewed: I have personally reviewed following labs and imaging studies  CBC: Recent Labs  Lab 02/04/20 0814 02/05/20 0424 02/06/20 1040 02/07/20 0614 02/08/20 0442  WBC 9.1 14.0* 11.9* 7.4 7.6  NEUTROABS  --   --  10.8* 6.1  5.8  HGB 12.2 12.5 10.2* 9.7* 9.7*  HCT 42.6 43.2 35.4* 32.8* 33.3*  MCV 99.3 95.8 97.8 96.2 98.2  PLT 187 211 194 179 737   Basic Metabolic Panel: Recent Labs  Lab 02/03/20 0611 02/03/20 0611 02/04/20 0814 02/05/20 0424 02/06/20 1040 02/07/20 0614 02/08/20 0442  NA 142   < > 140 136 135 139 140  K 4.1   < > 4.2 4.9 4.4 4.0 3.5  CL 92*   < > 90* 90* 90* 93* 93*  CO2 41*   < > 38* 35* 35* 38* 39*  GLUCOSE 180*   < > 114* 130* 117* 131* 118*  BUN 28*   < > 27* 23 20 29* 30*  CREATININE 0.36*   < > 0.42* 0.34* 0.38* 0.39* 0.37*  CALCIUM 9.2   < > 9.0 8.9 8.6* 8.5* 8.5*  MG 1.7  --  2.2 1.9  --   --  1.9  PHOS  --   --   --   --   --   --  2.8   < > = values in this interval not displayed.   GFR: Estimated Creatinine Clearance: 87 mL/min (A) (by C-G formula based on SCr of 0.37  mg/dL (L)). Liver Function Tests: Recent Labs  Lab 02/06/20 1040 02/07/20 0614 02/08/20 0442  AST 19 14* 10*  ALT _0 ALKPHOS 131* 116 112  BILITOT 1.4* 0.8 0.6  PROT 6.3* 6.3* 6.2*  ALBUMIN 2.1* 2.1* 2.3*   No results for input(s): LIPASE, AMYLASE in the last 168 hours. No results for input(s): AMMONIA in the last 168 hours. Coagulation Profile: No results for input(s): INR, PROTIME in the last 168 hours. Cardiac Enzymes: No results for input(s): CKTOTAL, CKMB, CKMBINDEX, TROPONINI in the last 168 hours. BNP (last 3 results) No results for input(s): PROBNP in the last 8760 hours. HbA1C: No results for input(s): HGBA1C in the last 72 hours. CBG: Recent Labs  Lab 02/08/20 0040 02/08/20 0322 02/08/20 0742 02/08/20 1216 02/08/20 1524  GLUCAP 103* 106* 124* 156* 150*   Lipid Profile: No results for input(s): CHOL, HDL, LDLCALC, TRIG, CHOLHDL, LDLDIRECT in the last 72 hours. Thyroid Function Tests: No results for input(s): TSH, T4TOTAL, FREET4, T3FREE, THYROIDAB in the last 72 hours. Anemia Panel: No results for input(s): VITAMINB12, FOLATE, FERRITIN, TIBC, IRON, RETICCTPCT in the last 72 hours. Sepsis Labs: No results for input(s): PROCALCITON, LATICACIDVEN in the last 168 hours.  Recent Results (from the past 240 hour(s))  Urine Culture     Status: None   Collection Time: 02/01/20  8:40 PM   Specimen: Urine, Clean Catch  Result Value Ref Range Status   Specimen Description   Final    URINE, CLEAN CATCH Performed at [facility], [address] [place], [place] [address]    Special Requests   Final    NONE Performed at [facility], [address] [place], [place] [address]    Culture   Final    NO GROWTH Performed at [facility], [place] [address], [place], [place] [address]    Report Status 02/03/2020 FINAL  Final    Radiology Studies: DG Chest Port 1 View  Result Date: 02/07/2020 CLINICAL DATA:  Endotracheal tube and NG tube placement EXAM:  PORTABLE CHEST 1 VIEW COMPARISON:  02/06/2020 FINDINGS: Endotracheal tube in good position. NG tube enters the stomach with the tip not visualized. Right pleural effusion and right lower lobe collapse unchanged. Mild left lower  lobe atelectasis and pleural effusion has progressed. No pulmonary edema. IMPRESSION: Endotracheal tube in good position. Right lower lobe collapse and right pleural effusion unchanged. Progressive left lower lobe atelectasis and effusion. Electronically Signed   By: Franchot Gallo M.D.   On: 02/07/2020 15:52   DG Abd Portable 1V  Result Date: 02/07/2020 CLINICAL DATA:  NG tube placement. EXAM: PORTABLE ABDOMEN - 1 VIEW COMPARISON:  02/07/2020 FINDINGS: NG has been advanced. Tip is now in the gastric antrum. No dilated bowel loops identified. Bibasilar airspace disease right greater than left. IMPRESSION: NG has been advanced with the tip now in the gastric antrum. No dilated bowel loops. Electronically Signed   By: Franchot Gallo M.D.   On: 02/07/2020 15:53   DG Abd Portable 1V  Result Date: 02/07/2020 CLINICAL DATA:  OG tube placement. EXAM: PORTABLE ABDOMEN - 1 VIEW COMPARISON:  02/03/2020 FINDINGS: The tip of an enteric tube projects over the gastric body with side hole also below the diaphragm. Mild gaseous distension of small bowel loops in the left hemiabdomen has decreased from the prior study with the lower abdomen and pelvis not included on today's examination. Consolidation in the right lung base and a right pleural effusion were more fully evaluated on yesterday's chest radiographs. IMPRESSION: Enteric tube in the stomach. Electronically Signed   By: Logan Bores M.D.   On: 02/07/2020 10:15   Scheduled Meds: . arformoterol  15 mcg Nebulization BID  . benztropine  0.5 mg Per Tube BID  . budesonide (PULMICORT) nebulizer solution  0.5 mg Nebulization BID  . chlorhexidine gluconate (MEDLINE KIT)  15 mL Mouth Rinse BID  . Chlorhexidine Gluconate Cloth  6 each Topical  Daily  . cloNIDine  0.2 mg Per Tube TID  . diltiazem  60 mg Per Tube Q6H  . docusate  100 mg Oral BID  . feeding supplement (PROSource TF)  45 mL Per Tube TID  . folic acid  1 mg Per Tube Daily  . free water  100 mL Per Tube Q4H  . insulin aspart  0-9 Units Subcutaneous Q4H  . mouth rinse  15 mL Mouth Rinse 10 times per day  . mupirocin ointment   Nasal Daily  . nicotine  7 mg Transdermal Daily  . pantoprazole sodium  40 mg Per Tube Q24H  . polyethylene glycol  17 g Oral Daily  . saccharomyces boulardii  250 mg Per Tube BID  . thiamine  100 mg Oral Daily   Or  . thiamine  100 mg Intravenous Daily  . valproic acid  500 mg Per Tube BID   Continuous Infusions: . sodium chloride 10 mL/hr at 01/29/20 1401  .  ceFAZolin (ANCEF) IV 2 g (02/08/20 1523)  . clindamycin (CLEOCIN) IV 600 mg (02/08/20 1324)  . feeding supplement (VITAL AF 1.2 CAL) 1,000 mL (02/07/20 0550)  . fentaNYL infusion INTRAVENOUS 200 mcg/hr (02/08/20 1321)  . heparin 2,450 Units/hr (02/08/20 0914)  . midazolam 4 mg/hr (02/07/20 2117)     LOS: 13 days   Critical Care Procedure Note Authorized and Performed by: Murvin Natal MD  Total Critical Care time:  32 mins Due to a high probability of clinically significant, life threatening deterioration, the patient required my highest level of preparedness to intervene emergently and I personally spent this critical care time directly and personally managing the patient.  This critical care time included obtaining a history; examining the patient, pulse oximetry; ordering and review of studies; arranging urgent treatment with development of  a management plan; evaluation of patient's response of treatment; frequent reassessment; and discussions with other providers.  This critical care time was performed to assess and manage the high probability of imminent and life threatening deterioration that could result in multi-organ failure.  It was exclusive of separately billable procedures  and treating other patients and teaching time.   Irwin Brakeman, MD How to contact the [facility] Attending or Consulting provider [place] or covering provider during after hours [place], for this patient?  1. Check the care team in [facility] and look for a) attending/consulting [facility] provider listed and b) the [facility] team listed 2. Log into [email or web address] and use [place]'s universal password to access. If you do not have the password, please contact the hospital operator. 3. Locate the [facility] provider you are looking for under [facility] and page to a number that you can be directly reached. 4. If you still have difficulty reaching the provider, please page the [facility] (Director on Call) for the Hospitalists listed on amion for assistance.   If 7PM-7AM, please contact night-coverage [email or web address] 02/08/2020, 4:51 PM

## 2020-02-08 NOTE — Progress Notes (Signed)
[facility]  Re-intubated yesterday. Dressing orders in for toe. Leave xerofrom on leg. Pathology pending. Dr. Arnoldo Morale aware patient may require trach if family decides that route.   Curlene Labrum, MD [facility] [address] [place], [place] [address] [phone] (office)

## 2020-02-08 NOTE — Progress Notes (Signed)
ANTICOAGULATION CONSULT NOTE - [place] for Heparin Indication: atrial fibrillation  Allergies  Allergen Reactions  . Bactrim [Sulfamethoxazole-Trimethoprim] Anaphylaxis  . Amoxicillin Hives  . Diclofenac Tinitus    swelling in feet  . Morphine And Related Nausea And Vomiting and Other (See Comments)    headache  . Potassium-Containing Compounds Itching    Patient Measurements: Height: 5\' 5"  (165.1 cm) Weight: 105.9 kg (233 lb 7.5 oz) IBW/kg (Calculated) : 57 Heparin Dosing Weight:  80 kg  Vital Signs: Temp: 98.8 F (37.1 C) (09/18 0400) Temp Source: Rectal (09/18 0400) BP: 98/45 (09/18 0600) Pulse Rate: 66 (09/18 0600)  Labs: Recent Labs    02/06/20 1040 02/06/20 1040 02/06/20 1943 02/07/20 0614 02/08/20 0442 02/08/20 0443  HGB 10.2*   < >  --  9.7* 9.7*  --   HCT 35.4*  --   --  32.8* 33.3*  --   PLT 194  --   --  179 200  --   HEPARINUNFRC  --   --  0.26* 0.35  --  0.29*  CREATININE 0.38*  --   --  0.39* 0.37*  --    < > = values in this interval not displayed.    Estimated Creatinine Clearance: 87 mL/min (A) (by C-G formula based on SCr of 0.37 mg/dL (L)).  Assessment: Pharmacy was consulted to dose heparin in 64 yr old female with atrial fibrillation.  Patient was not on anticoagulation prior to admission. 9/16 Heparin held for debridement of toe in OR. Surgeon okay to restart at noon. HL 0.29 this AM, just slightly subtherapeutic CBC WNL   Goal of Therapy:  Heparin level 0.3-0.7 units/ml Monitor platelets by anticoagulation protocol: Yes   Plan:  Increase heparin infusion rate to 2450 units/hr  Monitor anti-Xa level in 8 hours and daily heparin level, CBC Monitor for signs/symptoms of bleeding  Isac Sarna, BS [place], BCPS Clinical Pharmacist Pager [phone] 02/08/2020 7:35 AM

## 2020-02-09 DIAGNOSIS — J441 Chronic obstructive pulmonary disease with (acute) exacerbation: Secondary | ICD-10-CM | POA: Diagnosis not present

## 2020-02-09 DIAGNOSIS — N179 Acute kidney failure, unspecified: Secondary | ICD-10-CM | POA: Diagnosis not present

## 2020-02-09 DIAGNOSIS — Z515 Encounter for palliative care: Secondary | ICD-10-CM | POA: Diagnosis not present

## 2020-02-09 DIAGNOSIS — Z20822 Contact with and (suspected) exposure to covid-19: Secondary | ICD-10-CM | POA: Diagnosis not present

## 2020-02-09 DIAGNOSIS — L02611 Cutaneous abscess of right foot: Secondary | ICD-10-CM | POA: Diagnosis not present

## 2020-02-09 DIAGNOSIS — X58XXXA Exposure to other specified factors, initial encounter: Secondary | ICD-10-CM | POA: Diagnosis not present

## 2020-02-09 DIAGNOSIS — J440 Chronic obstructive pulmonary disease with acute lower respiratory infection: Secondary | ICD-10-CM | POA: Diagnosis not present

## 2020-02-09 DIAGNOSIS — F10239 Alcohol dependence with withdrawal, unspecified: Secondary | ICD-10-CM | POA: Diagnosis not present

## 2020-02-09 DIAGNOSIS — I96 Gangrene, not elsewhere classified: Secondary | ICD-10-CM | POA: Diagnosis not present

## 2020-02-09 DIAGNOSIS — G9341 Metabolic encephalopathy: Secondary | ICD-10-CM | POA: Diagnosis not present

## 2020-02-09 DIAGNOSIS — M726 Necrotizing fasciitis: Secondary | ICD-10-CM | POA: Diagnosis not present

## 2020-02-09 DIAGNOSIS — J9811 Atelectasis: Secondary | ICD-10-CM | POA: Diagnosis not present

## 2020-02-09 DIAGNOSIS — E1152 Type 2 diabetes mellitus with diabetic peripheral angiopathy with gangrene: Secondary | ICD-10-CM | POA: Diagnosis not present

## 2020-02-09 DIAGNOSIS — Y92230 Patient room in hospital as the place of occurrence of the external cause: Secondary | ICD-10-CM | POA: Diagnosis not present

## 2020-02-09 DIAGNOSIS — Z23 Encounter for immunization: Secondary | ICD-10-CM | POA: Diagnosis not present

## 2020-02-09 DIAGNOSIS — L97919 Non-pressure chronic ulcer of unspecified part of right lower leg with unspecified severity: Secondary | ICD-10-CM | POA: Diagnosis not present

## 2020-02-09 DIAGNOSIS — J9621 Acute and chronic respiratory failure with hypoxia: Secondary | ICD-10-CM | POA: Diagnosis not present

## 2020-02-09 DIAGNOSIS — J9622 Acute and chronic respiratory failure with hypercapnia: Secondary | ICD-10-CM | POA: Diagnosis not present

## 2020-02-09 DIAGNOSIS — E662 Morbid (severe) obesity with alveolar hypoventilation: Secondary | ICD-10-CM | POA: Diagnosis not present

## 2020-02-09 DIAGNOSIS — J811 Chronic pulmonary edema: Secondary | ICD-10-CM | POA: Diagnosis not present

## 2020-02-09 DIAGNOSIS — J810 Acute pulmonary edema: Secondary | ICD-10-CM | POA: Diagnosis not present

## 2020-02-09 DIAGNOSIS — J189 Pneumonia, unspecified organism: Secondary | ICD-10-CM | POA: Diagnosis not present

## 2020-02-09 DIAGNOSIS — A401 Sepsis due to streptococcus, group B: Secondary | ICD-10-CM | POA: Diagnosis not present

## 2020-02-09 DIAGNOSIS — L089 Local infection of the skin and subcutaneous tissue, unspecified: Secondary | ICD-10-CM | POA: Diagnosis present

## 2020-02-09 DIAGNOSIS — T17590A Other foreign object in bronchus causing asphyxiation, initial encounter: Secondary | ICD-10-CM | POA: Diagnosis not present

## 2020-02-09 LAB — GLUCOSE, CAPILLARY
Glucose-Capillary: 121 mg/dL — ABNORMAL HIGH (ref 70–99)
Glucose-Capillary: 134 mg/dL — ABNORMAL HIGH (ref 70–99)
Glucose-Capillary: 135 mg/dL — ABNORMAL HIGH (ref 70–99)
Glucose-Capillary: 135 mg/dL — ABNORMAL HIGH (ref 70–99)
Glucose-Capillary: 142 mg/dL — ABNORMAL HIGH (ref 70–99)
Glucose-Capillary: 146 mg/dL — ABNORMAL HIGH (ref 70–99)
Glucose-Capillary: 177 mg/dL — ABNORMAL HIGH (ref 70–99)

## 2020-02-09 LAB — PHOSPHORUS: Phosphorus: 3.4 mg/dL (ref 2.5–4.6)

## 2020-02-09 LAB — COMPREHENSIVE METABOLIC PANEL
ALT: 14 U/L (ref 0–44)
AST: 11 U/L — ABNORMAL LOW (ref 15–41)
Albumin: 2.1 g/dL — ABNORMAL LOW (ref 3.5–5.0)
Alkaline Phosphatase: 112 U/L (ref 38–126)
Anion gap: 9 (ref 5–15)
BUN: 25 mg/dL — ABNORMAL HIGH (ref 8–23)
CO2: 39 mmol/L — ABNORMAL HIGH (ref 22–32)
Calcium: 8.4 mg/dL — ABNORMAL LOW (ref 8.9–10.3)
Chloride: 91 mmol/L — ABNORMAL LOW (ref 98–111)
Creatinine, Ser: 0.35 mg/dL — ABNORMAL LOW (ref 0.44–1.00)
GFR calc Af Amer: >60 mL/min (ref >60)
GFR calc non Af Amer: >60 mL/min (ref >60)
Glucose, Bld: 145 mg/dL — ABNORMAL HIGH (ref 70–99)
Potassium: 3.7 mmol/L (ref 3.5–5.1)
Sodium: 139 mmol/L (ref 135–145)
Total Bilirubin: 0.6 mg/dL (ref 0.3–1.2)
Total Protein: 6.1 g/dL — ABNORMAL LOW (ref 6.5–8.1)

## 2020-02-09 LAB — CBC WITH DIFFERENTIAL/PLATELET
Abs Immature Granulocytes: 0.05 10*3/uL (ref 0.00–0.07)
Basophils Absolute: 0.1 10*3/uL (ref 0.0–0.1)
Basophils Relative: 1 %
Eosinophils Absolute: 0 10*3/uL (ref 0.0–0.5)
Eosinophils Relative: 1 %
HCT: 32.3 % — ABNORMAL LOW (ref 36.0–46.0)
Hemoglobin: 9.5 g/dL — ABNORMAL LOW (ref 12.0–15.0)
Immature Granulocytes: 1 %
Lymphocytes Relative: 21 %
Lymphs Abs: 1.3 10*3/uL (ref 0.7–4.0)
MCH: 28.6 pg (ref 26.0–34.0)
MCHC: 29.4 g/dL — ABNORMAL LOW (ref 30.0–36.0)
MCV: 97.3 fL (ref 80.0–100.0)
Monocytes Absolute: 0.5 10*3/uL (ref 0.1–1.0)
Monocytes Relative: 9 %
Neutro Abs: 4 10*3/uL (ref 1.7–7.7)
Neutrophils Relative %: 67 %
Platelets: 179 10*3/uL (ref 150–400)
RBC: 3.32 MIL/uL — ABNORMAL LOW (ref 3.87–5.11)
RDW: 14.8 % (ref 11.5–15.5)
WBC: 5.9 10*3/uL (ref 4.0–10.5)
nRBC: 0 % (ref 0.0–0.2)

## 2020-02-09 LAB — HEPARIN LEVEL (UNFRACTIONATED): Heparin Unfractionated: 0.51 IU/mL (ref 0.30–0.70)

## 2020-02-09 LAB — MAGNESIUM: Magnesium: 2 mg/dL (ref 1.7–2.4)

## 2020-02-09 MED ORDER — IPRATROPIUM BROMIDE 0.02 % IN SOLN
RESPIRATORY_TRACT | Status: AC
  Administered 2020-02-09: 0.5 mg
  Filled 2020-02-09: qty 2.5

## 2020-02-09 MED ORDER — DEXAMETHASONE SODIUM PHOSPHATE 4 MG/ML IJ SOLN
4.0000 mg | Freq: Two times a day (BID) | INTRAMUSCULAR | Status: DC
  Administered 2020-02-09 – 2020-02-15 (×12): 4 mg via INTRAVENOUS
  Filled 2020-02-09 (×13): qty 1

## 2020-02-09 NOTE — Progress Notes (Signed)
Pt arrived to unit from [facility]. Pt placed on vent settings from previous hospital. RT to continue to monitor.

## 2020-02-09 NOTE — Progress Notes (Signed)
ANTICOAGULATION CONSULT NOTE - [place] for Heparin Indication: atrial fibrillation  Allergies  Allergen Reactions  . Bactrim [Sulfamethoxazole-Trimethoprim] Anaphylaxis  . Amoxicillin Hives  . Diclofenac Tinitus    swelling in feet  . Morphine And Related Nausea And Vomiting and Other (See Comments)    headache  . Potassium-Containing Compounds Itching    Patient Measurements: Height: 5\' 5"  (165.1 cm) Weight: 106.9 kg (235 lb 10.8 oz) IBW/kg (Calculated) : 57 Heparin Dosing Weight:  80 kg  Vital Signs: Temp: 97.8 F (36.6 C) (09/19 0400) Temp Source: Rectal (09/19 0400) BP: 101/46 (09/19 0600) Pulse Rate: 60 (09/19 0600)  Labs: Recent Labs    02/07/20 0614 02/07/20 8937 02/08/20 0442 02/08/20 0443 02/08/20 1617 02/09/20 0615  HGB 9.7*   < > 9.7*  --   --  9.5*  HCT 32.8*  --  33.3*  --   --  32.3*  PLT 179  --  200  --   --  179  HEPARINUNFRC 0.35   < >  --  0.29* 0.42 0.51  CREATININE 0.39*  --  0.37*  --   --  0.35*   < > = values in this interval not displayed.    Estimated Creatinine Clearance: 87.5 mL/min (A) (by C-G formula based on SCr of 0.35 mg/dL (L)).  Assessment: Pharmacy was consulted to dose heparin in 64 yr old female with atrial fibrillation.  Patient was not on anticoagulation prior to admission. 9/16 Heparin held for debridement of toe in OR. Surgeon okay to restart at noon. HL 0.51 this PM, therapeutic CBC WNL   Goal of Therapy:  Heparin level 0.3-0.7 units/ml Monitor platelets by anticoagulation protocol: Yes   Plan:  IContinue heparin infusion rate at 2450 units/hr  Monitor anti-Xa level  daily heparin level, CBC Monitor for signs/symptoms of bleeding  Isac Sarna, BS [place], BCPS Clinical Pharmacist Pager [phone] 02/09/2020 8:42 AM

## 2020-02-09 NOTE — Progress Notes (Signed)
PROGRESS NOTE    [name] [name]  BLT:[id number] DOB: [birth year] DOA: 01/26/2020 PCP: [name] Downing, MD   Brief Narrative:  Per HPI: [name] [name] a 64 y.o.femalewith medical history significant forCOPD on 2L [place] at bedtime, hypertension, type 2 diabetes, obesity, bipolar disorder/anxiety disorder, ongoing tobacco use,questionable ongoing alcohol abuse,and prior polysubstance abuse with methamphetamine and alcohol who presented to the ED with progressive worsening of pain, erythema, and serosanguineous drainage from her right second toe. This began to gradually develop after her daughter trimmed her nail too close to her skin approximately 2 weeks ago. She has noticed some fever, chills, body aches, and mild headache as well as general malaise at home. She tried soaking her foot at home with no improvement in her symptoms. She denies any chest pain, shortness of breath, abdominal pain, nausea, vomiting, or dysuria. She denies any upper respiratory symptoms as well. She has not been vaccinated for COVID-19.  ED Course:Vital signs demonstrate ongoing sinus tachycardia and this is confirmed on EKG with heart rate approximately 110 bpm. Temperature 1-1.5 Fahrenheit. She is noted to have a leukocytosis of 12,900 as well as a thrombocytopenia of 129,000. BUN is 38 and creatinine is elevated at 1.1 with baseline 0.3-0.5. Chest x-ray with mild cardiomegaly and no other acute findings. Covid testing negative. Right foot x-ray with no soft tissue swelling or signs of osteomyelitis noted. Lactic acid is 1.5. Patient has been ordered and started 3 L fluid bolus as well as vancomycin and Rocephin for suspected sepsis secondary to cellulitis.  9/14: Patient remains sedated on ventilator this morning with PCCM following.  She is started to have some signs of ileus.  Tube feedings have been discontinued.  Plan to restart today and see if this is tolerated.  Try bisacodyl suppository  as well.  Continue IV antibiotics with vancomycin to treat group B strep bacteremia as well as what appears to be worsening cellulitis and mild fasciitis to right lower extremity.  TEE suspended until patient's clinical condition further improved.  9/15: Pt remains on ventilator.  Concerning right leg lesions and toe lesion, surgery consult requested.  Palliative medicine consulted for goals of care discussions.  Cardiology awaiting for patient to come off vent for consideration of TEE, remains on IV heparin for atrial fibrillation.   9/16:  Pt attempting to pull out ET tube.    9/17: Pt extubated briefly and then re-intubated, trickle tube feed restarted   9/18: Pt remains on vent. Family still wants to pursue trach. Partial code status established.  9/19: Pt to transfer to [facility] ICU for tracheostomy.  I spoke with [name] [name] critical care team and they agreed to accept patient in transfer.     Assessment & Plan:   Active Problems:   Acute respiratory failure with hypoxia and hypercapnia   Sepsis   Cellulitis of right lower leg   Atrial fibrillation with RVR    Pleural effusion on right   Leukocytosis   Gangrene of toe of right foot   Petechial rash   Toe infection   Endotracheally intubated   Palliative care by specialist   Goals of care, counseling/discussion    1-sepsis: In the setting of Group A Streptococcus cellulitis/bacteremia. Present on admission -Patient met sepsis criteria on admission with sinus tachycardia, elevated white blood cells, febrile and with identified source of infection of cellulitic process. She had acute kidney injury as organ dysfunction. -No signs of osteomyelitis onfootchest x-ray -Blood culture positive for Group A Streptococcus -Continue on cefazolin IV.  Completed clindamycin.  -Transthoracic echocardiogram with LVEF 55 to 60% and no vegetations noted, appreciate cardiology consultation for TEEonce extubated -CT right lower extremity on 9/11 with  findings of diffuse soft tissue edema and moderate fasciitis, no abscess or fluid collection noted. -Continue as needed antipyretics and supportive care.  Right 2nd toe infection and right leg with necrotic black areas concerning and I have asked for a surgical consultation.  -POD#3 s/p OR debridement and skin biopsy by Dr. Constance [name]. Follow up pathology.    2-acute on chronic respiratory failure with hypoxia and hypercapnias/p intubation -Appears to be a combination of COPD exacerbation, Underlying obesity hypoventilation syndrome and component of interstitial pulmonary edema/vascular congestion after fluid resuscitation as per sepsis protocol. -Worsening hypoxemia on 9/12 that has now improved after diuresis, holding further Lasix for now due to poor intake -Palliative team met with family on 9/16.  Try to wean vent 9/17.   -PCCM following with further recommendations appreciated  3-acute kidney injury-resolved -Baseline creatinine 0.6-0.7, currently around0.4 -Monitor labs closely with Lasix for diuresis prn -Follow BMP.  UOP adequate.   4-mild thrombocytopenia-resolved -Likely related to acute sepsis and underlying history of alcohol abuse -No signs of overt bleeding -Monitor carefully while on heparin drip -Follow CBC.  5-alcohol abuse -CIWA monitoring -Continue thiamine and folic acid.  6-class II obesity  -Body mass index is 36.58 kg/m. -Low calorie diet, portion control and increase physical activity discussed with patient.  7-history of hypertension-stable -Monitor closely with IV Lasix ordered -Clonidine 3 times daily per PCCM -Hydrocortisone discontinued. -Continue oral Cardizem -Labetalol as needed  8-history of tobacco abuse -Cessation counseling has been provided. -Continue nicotine patch.  9-depression/anxiety/history of seizures -Plan is to resume psychotropic home medications once able to take by mouth. -Continue Depakote  10-type 2 diabetes  mellitus -A1c 5.8 -While inpatient continue sliding scale insulin -At discharge will resume the use of Metformin in order to continue assisting with management of insulin resistant and obesity. -Modified carbohydrate diet and lifestylerecommended after discharge.  CBG (last 3)  Recent Labs    02/09/20 0042 02/09/20 0408 02/09/20 0805  GLUCAP 146* 121* 134*   11-new onset atrial fibrillation with RVR-currently in sinus rhythm -Continue oral Cardizem as ordered -TSH 0.294, check freeT3 0.7, T4 0.75 -2D echocardiogram with LVEF 55-60% -CHA2DS2-VASc score of 3 for which heparin driphad been started, may not require anticoagulation on discharge if this appears to simply be an isolated event. -Appreciatecardiology recommendations for eventual TEE once extubated  12-Hypokalemia-resolved -Monitor  13-ileus with poor tolerance of tube feeds -Trial of bisacodyl suppository today -Maintain magnesium and potassium of 2 and 4 respectively -Pt continues to vomit tube feeds.  -Pt had BM 9/17.  Try to restart trickle tube feeds 9/17.   -9/19: pt tolerating tube feeds, no emesis.    DVT prophylaxis:Heparin drip Code Status:Partial  Family Communication:Discussed with daughter at bedside9/14, 9/15, 9/16, 9/17 daughter [name] [phone], 9/18 daughter [name] at bedside Disposition Plan:transfer to [name], discussed with critical care team  Status is: Inpatient  Remains inpatient appropriate because:Ongoing diagnostic testing needed not appropriate for outpatient work up, IV treatments appropriate due to intensity of illness or inability to take PO and Inpatient level of care appropriate due to severity of illness.  Palliative medicine consulted for goals of care discussions.   Dispo: The patient is from:Home Anticipated d/c is to:SNF Anticipated d/c date is: > 3 days Patient currently is not medically stable to d/c.Patient currently  remains intubated on the ventilator and will  need further evaluation per cardiology along with ongoing evaluation per PCCM.  Consultants:  PCCM  Cardiology  Procedures:  2D echocardiogram 9/8 with LVEF 55-60%. No vegetations.  Antimicrobials:  Anti-infectives (From admission, onward)   Start     Dose/Rate Route Frequency Ordered Stop   02/05/20 2200  ceFAZolin (ANCEF) IVPB 2g/100 mL premix        2 g 200 mL/hr over 30 Minutes Intravenous Every 8 hours 02/05/20 1309     02/04/20 2200  clindamycin (CLEOCIN) IVPB 600 mg        600 mg 100 mL/hr over 30 Minutes Intravenous Every 8 hours 02/04/20 2018 02/09/20 0630   02/02/20 1100  vancomycin (VANCOREADY) IVPB 1500 mg/300 mL  Status:  Discontinued        1,500 mg 150 mL/hr over 120 Minutes Intravenous Every 24 hours 02/01/20 1225 02/05/20 1308   02/01/20 1130  vancomycin (VANCOREADY) IVPB 2000 mg/400 mL        2,000 mg 200 mL/hr over 120 Minutes Intravenous  Once 02/01/20 1108 02/01/20 2010   01/27/20 1500  vancomycin (VANCOREADY) IVPB 1500 mg/300 mL  Status:  Discontinued        1,500 mg 150 mL/hr over 120 Minutes Intravenous Every 24 hours 01/26/20 1428 01/27/20 0827   01/27/20 0830  ceFAZolin (ANCEF) IVPB 2g/100 mL premix  Status:  Discontinued        2 g 200 mL/hr over 30 Minutes Intravenous Every 8 hours 01/27/20 0827 02/01/20 1222   01/26/20 1545  vancomycin (VANCOCIN) IVPB 1000 mg/200 mL premix  Status:  Discontinued        1,000 mg 200 mL/hr over 60 Minutes Intravenous  Once 01/26/20 1541 01/26/20 1553   01/26/20 1415  vancomycin (VANCOCIN) IVPB 1000 mg/200 mL premix  Status:  Discontinued        1,000 mg 200 mL/hr over 60 Minutes Intravenous  Once 01/26/20 1408 01/26/20 1413   01/26/20 1415  cefTRIAXone (ROCEPHIN) 2 g in sodium chloride 0.9 % 100 mL IVPB        2 g 200 mL/hr over 30 Minutes Intravenous  Once 01/26/20 1408 01/26/20 2011   01/26/20 1415  vancomycin (VANCOREADY) IVPB 2000 mg/400 mL        2,000  mg 200 mL/hr over 120 Minutes Intravenous  Once 01/26/20 1413 01/26/20 2011      Subjective: Patient remains sedated on vent.    Objective: Vitals:   02/09/20 0500 02/09/20 0535 02/09/20 0600 02/09/20 0641  BP: (!) 111/45 (!) 114/45 (!) 101/46   Pulse: 73  60   Resp: 18  16   Temp:      TempSrc:      SpO2: 92%  93% 95%  Weight: 106.9 kg     Height:        Intake/Output Summary (Last 24 hours) at 02/09/2020 0954 Last data filed at 02/09/2020 0533 Gross per 24 hour  Intake 3764.85 ml  Output 450 ml  Net 3314.85 ml   Filed Weights   02/06/20 0500 02/07/20 0500 02/09/20 0500  Weight: 105.1 kg 105.9 kg 106.9 kg    Examination:  General exam: Appears sedated on ventilator but easily arousable. Respiratory system: Coarse bilaterally.  Cardiovascular system: S1 & S2 heard. Gastrointestinal system: Abdomen minimally distended, bowel sounds present. Central nervous system: Sedated on ventilator Extremities: wound clean dry intact. Skin: wounds clean and dry and intact.  As above Psychiatry: Pt medically sedated.   Data Reviewed: I have personally reviewed following labs and  imaging studies  CBC: Recent Labs  Lab 02/05/20 0424 02/06/20 1040 02/07/20 0614 02/08/20 0442 02/09/20 0615  WBC 14.0* 11.9* 7.4 7.6 5.9  NEUTROABS  --  10.8* 6.1 5.8 4.0  HGB 12.5 10.2* 9.7* 9.7* 9.5*  HCT 43.2 35.4* 32.8* 33.3* 32.3*  MCV 95.8 97.8 96.2 98.2 97.3  PLT 211 194 179 200 891   Basic Metabolic Panel: Recent Labs  Lab 02/03/20 0611 02/03/20 0611 02/04/20 0814 02/04/20 0814 02/05/20 0424 02/06/20 1040 02/07/20 0614 02/08/20 0442 02/09/20 0615  NA 142   < > 140   < > 136 135 139 140 139  K 4.1   < > 4.2   < > 4.9 4.4 4.0 3.5 3.7  CL 92*   < > 90*   < > 90* 90* 93* 93* 91*  CO2 41*   < > 38*   < > 35* 35* 38* 39* 39*  GLUCOSE 180*   < > 114*   < > 130* 117* 131* 118* 145*  BUN 28*   < > 27*   < > 23 20 29* 30* 25*  CREATININE 0.36*   < > 0.42*   < > 0.34* 0.38* 0.39*  0.37* 0.35*  CALCIUM 9.2   < > 9.0   < > 8.9 8.6* 8.5* 8.5* 8.4*  MG 1.7  --  2.2  --  1.9  --   --  1.9 2.0  PHOS  --   --   --   --   --   --   --  2.8 3.4   < > = values in this interval not displayed.   GFR: Estimated Creatinine Clearance: 87.5 mL/min (A) (by C-G formula based on SCr of 0.35 mg/dL (L)). Liver Function Tests: Recent Labs  Lab 02/06/20 1040 02/07/20 0614 02/08/20 0442 02/09/20 0615  AST 19 14* 10* 11*  ALT _0 ALKPHOS 131* 116 112 112  BILITOT 1.4* 0.8 0.6 0.6  PROT 6.3* 6.3* 6.2* 6.1*  ALBUMIN 2.1* 2.1* 2.3* 2.1*   No results for input(s): LIPASE, AMYLASE in the last 168 hours. No results for input(s): AMMONIA in the last 168 hours. Coagulation Profile: No results for input(s): INR, PROTIME in the last 168 hours. Cardiac Enzymes: No results for input(s): CKTOTAL, CKMB, CKMBINDEX, TROPONINI in the last 168 hours. BNP (last 3 results) No results for input(s): PROBNP in the last 8760 hours. HbA1C: No results for input(s): HGBA1C in the last 72 hours. CBG: Recent Labs  Lab 02/08/20 1524 02/08/20 1947 02/09/20 0042 02/09/20 0408 02/09/20 0805  GLUCAP 150* 130* 146* 121* 134*   Lipid Profile: No results for input(s): CHOL, HDL, LDLCALC, TRIG, CHOLHDL, LDLDIRECT in the last 72 hours. Thyroid Function Tests: No results for input(s): TSH, T4TOTAL, FREET4, T3FREE, THYROIDAB in the last 72 hours. Anemia Panel: No results for input(s): VITAMINB12, FOLATE, FERRITIN, TIBC, IRON, RETICCTPCT in the last 72 hours. Sepsis Labs: No results for input(s): PROCALCITON, LATICACIDVEN in the last 168 hours.  Recent Results (from the past 240 hour(s))  Urine Culture     Status: None   Collection Time: 02/01/20  8:40 PM   Specimen: Urine, Clean Catch  Result Value Ref Range Status   Specimen Description   Final    URINE, CLEAN CATCH Performed at [facility], [address] [place], [place] [address]    Special Requests   Final    NONE Performed at  [name], [address], [place], [place] [address]  Culture   Final    NO GROWTH Performed at [facility], [place] [address], [place], [place] [address]    Report Status 02/03/2020 FINAL  Final    Radiology Studies: DG Chest Port 1 View  Result Date: 02/07/2020 CLINICAL DATA:  Endotracheal tube and NG tube placement EXAM: PORTABLE CHEST 1 VIEW COMPARISON:  02/06/2020 FINDINGS: Endotracheal tube in good position. NG tube enters the stomach with the tip not visualized. Right pleural effusion and right lower lobe collapse unchanged. Mild left lower lobe atelectasis and pleural effusion has progressed. No pulmonary edema. IMPRESSION: Endotracheal tube in good position. Right lower lobe collapse and right pleural effusion unchanged. Progressive left lower lobe atelectasis and effusion. Electronically Signed   By: Franchot Gallo M.D.   On: 02/07/2020 15:52   DG Abd Portable 1V  Result Date: 02/07/2020 CLINICAL DATA:  NG tube placement. EXAM: PORTABLE ABDOMEN - 1 VIEW COMPARISON:  02/07/2020 FINDINGS: NG has been advanced. Tip is now in the gastric antrum. No dilated bowel loops identified. Bibasilar airspace disease right greater than left. IMPRESSION: NG has been advanced with the tip now in the gastric antrum. No dilated bowel loops. Electronically Signed   By: Franchot Gallo M.D.   On: 02/07/2020 15:53   DG Abd Portable 1V  Result Date: 02/07/2020 CLINICAL DATA:  OG tube placement. EXAM: PORTABLE ABDOMEN - 1 VIEW COMPARISON:  02/03/2020 FINDINGS: The tip of an enteric tube projects over the gastric body with side hole also below the diaphragm. Mild gaseous distension of small bowel loops in the left hemiabdomen has decreased from the prior study with the lower abdomen and pelvis not included on today's examination. Consolidation in the right lung base and a right pleural effusion were more fully evaluated on yesterday's chest radiographs. IMPRESSION: Enteric tube in the stomach.  Electronically Signed   By: Logan Bores M.D.   On: 02/07/2020 10:15   Scheduled Meds: . arformoterol  15 mcg Nebulization BID  . benztropine  0.5 mg Per Tube BID  . budesonide (PULMICORT) nebulizer solution  0.5 mg Nebulization BID  . chlorhexidine gluconate (MEDLINE KIT)  15 mL Mouth Rinse BID  . Chlorhexidine Gluconate Cloth  6 each Topical Daily  . cloNIDine  0.2 mg Per Tube TID  . diltiazem  60 mg Per Tube Q6H  . docusate  100 mg Oral BID  . feeding supplement (PROSource TF)  45 mL Per Tube TID  . folic acid  1 mg Per Tube Daily  . free water  100 mL Per Tube Q4H  . insulin aspart  0-9 Units Subcutaneous Q4H  . mouth rinse  15 mL Mouth Rinse 10 times per day  . mupirocin ointment   Nasal Daily  . nicotine  7 mg Transdermal Daily  . pantoprazole sodium  40 mg Per Tube Q24H  . polyethylene glycol  17 g Oral Daily  . saccharomyces boulardii  250 mg Per Tube BID  . thiamine  100 mg Oral Daily   Or  . thiamine  100 mg Intravenous Daily  . valproic acid  500 mg Per Tube BID   Continuous Infusions: . sodium chloride 10 mL/hr at 01/29/20 1401  .  ceFAZolin (ANCEF) IV 2 g (02/09/20 0533)  . feeding supplement (VITAL AF 1.2 CAL) 1,000 mL (02/08/20 2200)  . fentaNYL infusion INTRAVENOUS 150 mcg/hr (02/09/20 0403)  . heparin 2,450 Units/hr (02/09/20 0727)  . midazolam 2 mg/hr (02/09/20 0821)    LOS: 14 days  Critical Care Procedure Note Authorized and Performed by: Murvin Natal MD  Total Critical Care time: 35 mins Due to a high probability of clinically significant, life threatening deterioration, the patient required my highest level of preparedness to intervene emergently and I personally spent this critical care time directly and personally managing the patient.  This critical care time included obtaining a history; examining the patient, pulse oximetry; ordering and review of studies; arranging urgent treatment with development of a management plan; evaluation of patient's  response of treatment; frequent reassessment; and discussions with other providers.  This critical care time was performed to assess and manage the high probability of imminent and life threatening deterioration that could result in multi-organ failure.  It was exclusive of separately billable procedures and treating other patients and teaching time.   Irwin Brakeman, MD How to contact the [name] Attending or Consulting provider [place] or covering provider during after hours [place], for this patient?  1. Check the care team in [name] and look for a) attending/consulting TR[facility] provider listed and b) the [facility] team listed 2. Log into [email or web address] and use [facility]'s universal password to access. If you do not have the password, please contact the hospital operator. 3. Locate the [name] provider you are looking for under [facility] and page to a number that you can be directly reached. 4. If you still have difficulty reaching the provider, please page the [name] (Director on Call) for the Hospitalists listed on amion for assistance.   If 7PM-7AM, please contact night-coverage [email or web address] 02/09/2020, 9:54 AM

## 2020-02-09 NOTE — Discharge Summary (Deleted)
PROGRESS NOTE   [name] [name]  GYB:[id number] DOB: 12/02/1955 DOA: 01/26/2020 PCP: Leonard Downing, MD  Brief Narrative:  Per HPI: [name] [name] a 65 y.o.femalewith medical history significant forCOPD on 2L [place] at bedtime, hypertension, type 2 diabetes, obesity, bipolar disorder/anxiety disorder, ongoing tobacco use,questionable ongoing alcohol abuse,and prior polysubstance abuse with methamphetamine and alcohol who presented to the ED with progressive worsening of pain, erythema, and serosanguineous drainage from her right second toe. This began to gradually develop after her daughter trimmed her nail too close to her skin approximately 2 weeks ago. She has noticed some fever, chills, body aches, and mild headache as well as general malaise at home. She tried soaking her foot at home with no improvement in her symptoms. She denies any chest pain, shortness of breath, abdominal pain, nausea, vomiting, or dysuria. She denies any upper respiratory symptoms as well. She has not been vaccinated for COVID-19.  ED Course:Vital signs demonstrate ongoing sinus tachycardia and this is confirmed on EKG with heart rate approximately 110 bpm. Temperature 1-1.5 Fahrenheit. She is noted to have a leukocytosis of 12,900 as well as a thrombocytopenia of 129,000. BUN is 38 and creatinine is elevated at 1.1 with baseline 0.3-0.5. Chest x-ray with mild cardiomegaly and no other acute findings. Covid testing negative. Right foot x-ray with no soft tissue swelling or signs of osteomyelitis noted. Lactic acid is 1.5. Patient has been ordered and started 3 L fluid bolus as well as vancomycin and Rocephin for suspected sepsis secondary to cellulitis.  9/14: Patient remains sedated on ventilator this morning with PCCM following.  She is started to have some signs of ileus.  Tube feedings have been discontinued.  Plan to restart today and see if this is tolerated.  Try bisacodyl suppository as  well.  Continue IV antibiotics with vancomycin to treat group B strep bacteremia as well as what appears to be worsening cellulitis and mild fasciitis to right lower extremity.  TEE suspended until patient's clinical condition further improved.  9/15: Pt remains on ventilator.  Concerning right leg lesions and toe lesion, surgery consult requested.  Palliative medicine consulted for goals of care discussions.    9/16:  Pt attempting to pull out ET tube.    9/17: Pt extubated briefly and then re-intubated, trickle tube feed restarted   9/18: Pt remains on vent. Family still wants to pursue trach.  9/19: Pt transferring to ICU at [facility] for ENT consult for trach. I spoke and updated critical care team at [facility] and they have agreed to take patient in transfer.   Assessment & Plan:   Active Problems:   Acute respiratory failure with hypoxia and hypercapnia   Sepsis   Cellulitis of right lower leg   Atrial fibrillation with RVR    Pleural effusion on right   Leukocytosis   Gangrene of toe of right foot   Petechial rash   Toe infection   Endotracheally intubated   Palliative care by specialist   Goals of care, counseling/discussion    1-sepsis: In the setting of Group A Streptococcus cellulitis/bacteremia. Present on admission -Patient met sepsis criteria on admission with sinus tachycardia, elevated white blood cells, febrile and with identified source of infection of cellulitic process. She had acute kidney injury as organ dysfunction. -No signs of osteomyelitis onfootchest x-ray -Blood culture positive for Group A Streptococcus -Continue on cefazolin IV.  -Transthoracic echocardiogram with LVEF 55 to 60% and no vegetations noted, appreciate cardiology consultation for TEEonce extubated -CT right lower extremity  on 9/11 with findings of diffuse soft tissue edema and moderate fasciitis, no abscess or fluid collection noted. -Continue as needed antipyretics and supportive care.  Right  2nd toe infection and right leg with necrotic black areas concerning and I have asked for a surgical consultation.  -POD#3 s/p OR debridement and skin biopsy by Dr. Constance Haw. Follow up pathology from skin biopsy.    2-acute on chronic respiratory failure with hypoxia and hypercapnias/p intubation -Appears to be a combination of COPD exacerbation, Underlying obesity hypoventilation syndrome and component of interstitial pulmonary edema/vascular congestion after fluid resuscitation as per sepsis protocol. -Worsening hypoxemia on 9/12 that has now improved after diuresis, holding further Lasix for now due to poor intake -Palliative team met with family on 9/16.  Try to wean vent 9/17.   -PCCM following with further recommendations appreciated  3-acute kidney injury-resolved -Baseline creatinine 0.6-0.7, currently around0.4 -Monitor labs closely with Lasix for diuresis prn -Monitor BMP, UOP has been adequate  4-mild thrombocytopenia-resolved -Likely related to acute sepsis and underlying history of alcohol abuse -No signs of overt bleeding -Monitor carefully while on heparin drip -Follow CBC.  5-alcohol abuse -CIWA monitoring -Continue thiamine and folic acid.  6-class II obesity  -Body mass index is 36.58 kg/m. -Low calorie diet, portion control and increase physical activity discussed with patient.  7-history of hypertension-stable -Monitor closely with IV Lasix ordered -Clonidine 3 times daily per PCCM -Hydrocortisone discontinued. -Continue oral Cardizem -Labetalol as needed  8-history of tobacco abuse -Cessation counseling has been provided. -Continue nicotine patch.  9-depression/anxiety/history of seizures -Plan is to resume psychotropic home medications once able to take by mouth. -Continue Depakote  10-type 2 diabetes mellitus -A1c 5.8 -While inpatient continue sliding scale insulin -At discharge will resume the use of Metformin in order to continue  assisting with management of insulin resistant and obesity. -Modified carbohydrate diet and lifestylerecommended after discharge.  CBG (last 3)  Recent Labs    02/09/20 0042 02/09/20 0408 02/09/20 0805  GLUCAP 146* 121* 134*   11-new onset atrial fibrillation with RVR-currently in sinus rhythm -Continue oral Cardizem as ordered -TSH 0.294, check freeT3 0.7, T4 0.75 -2D echocardiogram with LVEF 55-60% -CHA2DS2-VASc score of 3 for which heparin driphad been started, may not require anticoagulation on discharge if this appears to simply be an isolated event. -Appreciatecardiology recommendations for eventual TEE once extubated  12-Hypokalemia-resolved -Monitor  13-ileus with poor tolerance of tube feeds -Trial of bisacodyl suppository today -Maintain magnesium and potassium of 2 and 4 respectively -Pt continues to vomit tube feeds.  -Pt had BM 9/17.  Restarted trickle tube feeds 9/17.    -9/19: Pt seems to be tolerating tube feeds, no further emesis.   DVT prophylaxis:Heparin drip Code Status:Partial  Family Communication:Discussed with daughter at bedside9/14, 9/15, 9/16, 9/17 daughter [name] [phone], 9/18 daughter [name] at bedside Disposition Plan: Status is: Inpatient  Remains inpatient appropriate because:Ongoing diagnostic testing needed not appropriate for outpatient work up, IV treatments appropriate due to intensity of illness or inability to take PO and Inpatient level of care appropriate due to severity of illness.  Palliative medicine consulted for goals of care discussions.   Dispo: The patient is from:Home Anticipated d/c is to:SNF Anticipated d/c date is: > 3 days Patient currently is not medically stable to d/c.Patient currently remains intubated on the ventilator and will need further evaluation per cardiology along with ongoing evaluation per  PCCM.  Consultants:  PCCM  Cardiology  Procedures:  2D echocardiogram 9/8 with LVEF 55-60%. No vegetations.  Antimicrobials:  Anti-infectives (From admission, onward)   Start     Dose/Rate Route Frequency Ordered Stop   02/05/20 2200  ceFAZolin (ANCEF) IVPB 2g/100 mL premix        2 g 200 mL/hr over 30 Minutes Intravenous Every 8 hours 02/05/20 1309     02/04/20 2200  clindamycin (CLEOCIN) IVPB 600 mg        600 mg 100 mL/hr over 30 Minutes Intravenous Every 8 hours 02/04/20 2018 02/09/20 0630   02/02/20 1100  vancomycin (VANCOREADY) IVPB 1500 mg/300 mL  Status:  Discontinued        1,500 mg 150 mL/hr over 120 Minutes Intravenous Every 24 hours 02/01/20 1225 02/05/20 1308   02/01/20 1130  vancomycin (VANCOREADY) IVPB 2000 mg/400 mL        2,000 mg 200 mL/hr over 120 Minutes Intravenous  Once 02/01/20 1108 02/01/20 2010   01/27/20 1500  vancomycin (VANCOREADY) IVPB 1500 mg/300 mL  Status:  Discontinued        1,500 mg 150 mL/hr over 120 Minutes Intravenous Every 24 hours 01/26/20 1428 01/27/20 0827   01/27/20 0830  ceFAZolin (ANCEF) IVPB 2g/100 mL premix  Status:  Discontinued        2 g 200 mL/hr over 30 Minutes Intravenous Every 8 hours 01/27/20 0827 02/01/20 1222   01/26/20 1545  vancomycin (VANCOCIN) IVPB 1000 mg/200 mL premix  Status:  Discontinued        1,000 mg 200 mL/hr over 60 Minutes Intravenous  Once 01/26/20 1541 01/26/20 1553   01/26/20 1415  vancomycin (VANCOCIN) IVPB 1000 mg/200 mL premix  Status:  Discontinued        1,000 mg 200 mL/hr over 60 Minutes Intravenous  Once 01/26/20 1408 01/26/20 1413   01/26/20 1415  cefTRIAXone (ROCEPHIN) 2 g in sodium chloride 0.9 % 100 mL IVPB        2 g 200 mL/hr over 30 Minutes Intravenous  Once 01/26/20 1408 01/26/20 2011   01/26/20 1415  vancomycin (VANCOREADY) IVPB 2000 mg/400 mL        2,000 mg 200 mL/hr over 120 Minutes Intravenous  Once 01/26/20 1413 01/26/20 2011      Subjective: Pt remains on vent  sedated.      Objective: Vitals:   02/09/20 0500 02/09/20 0535 02/09/20 0600 02/09/20 0641  BP: (!) 111/45 (!) 114/45 (!) 101/46   Pulse: 73  60   Resp: 18  16   Temp:      TempSrc:      SpO2: 92%  93% 95%  Weight: 106.9 kg     Height:        Intake/Output Summary (Last 24 hours) at 02/09/2020 0935 Last data filed at 02/09/2020 0533 Gross per 24 hour  Intake 3764.85 ml  Output 450 ml  Net 3314.85 ml   Filed Weights   02/06/20 0500 02/07/20 0500 02/09/20 0500  Weight: 105.1 kg 105.9 kg 106.9 kg   Examination:  General exam: Appears sedated on ventilator but easily arousable.  Respiratory system: Coarse BS bilaterally.  Intubated on FiO2 40% Cardiovascular system: S1 & S2 heard. Gastrointestinal system: Abdomen minimally distended, bowel sounds present. Central nervous system: Sedated on ventilator Extremities: wounds clean and dry and intact. Skin: wounds clean and dry and intact.  As above Psychiatry: Cannot be assessed  Data Reviewed: I have personally reviewed following labs and imaging studies  CBC: Recent Labs  Lab 02/05/20 0424 02/06/20 1040 02/07/20 0614 02/08/20 0442 02/09/20 0615  WBC 14.0*  11.9* 7.4 7.6 5.9  NEUTROABS  --  10.8* 6.1 5.8 4.0  HGB 12.5 10.2* 9.7* 9.7* 9.5*  HCT 43.2 35.4* 32.8* 33.3* 32.3*  MCV 95.8 97.8 96.2 98.2 97.3  PLT 211 194 179 200 789   Basic Metabolic Panel: Recent Labs  Lab 02/03/20 0611 02/03/20 0611 02/04/20 0814 02/04/20 0814 02/05/20 0424 02/06/20 1040 02/07/20 0614 02/08/20 0442 02/09/20 0615  NA 142   < > 140   < > 136 135 139 140 139  K 4.1   < > 4.2   < > 4.9 4.4 4.0 3.5 3.7  CL 92*   < > 90*   < > 90* 90* 93* 93* 91*  CO2 41*   < > 38*   < > 35* 35* 38* 39* 39*  GLUCOSE 180*   < > 114*   < > 130* 117* 131* 118* 145*  BUN 28*   < > 27*   < > 23 20 29* 30* 25*  CREATININE 0.36*   < > 0.42*   < > 0.34* 0.38* 0.39* 0.37* 0.35*  CALCIUM 9.2   < > 9.0   < > 8.9 8.6* 8.5* 8.5* 8.4*  MG 1.7  --  2.2  --  1.9   --   --  1.9 2.0  PHOS  --   --   --   --   --   --   --  2.8 3.4   < > = values in this interval not displayed.   GFR: Estimated Creatinine Clearance: 87.5 mL/min (A) (by C-G formula based on SCr of 0.35 mg/dL (L)). Liver Function Tests: Recent Labs  Lab 02/06/20 1040 02/07/20 0614 02/08/20 0442 02/09/20 0615  AST 19 14* 10* 11*  ALT _0 ALKPHOS 131* 116 112 112  BILITOT 1.4* 0.8 0.6 0.6  PROT 6.3* 6.3* 6.2* 6.1*  ALBUMIN 2.1* 2.1* 2.3* 2.1*   No results for input(s): LIPASE, AMYLASE in the last 168 hours. No results for input(s): AMMONIA in the last 168 hours. Coagulation Profile: No results for input(s): INR, PROTIME in the last 168 hours. Cardiac Enzymes: No results for input(s): CKTOTAL, CKMB, CKMBINDEX, TROPONINI in the last 168 hours. BNP (last 3 results) No results for input(s): PROBNP in the last 8760 hours. HbA1C: No results for input(s): HGBA1C in the last 72 hours. CBG: Recent Labs  Lab 02/08/20 1524 02/08/20 1947 02/09/20 0042 02/09/20 0408 02/09/20 0805  GLUCAP 150* 130* 146* 121* 134*   Lipid Profile: No results for input(s): CHOL, HDL, LDLCALC, TRIG, CHOLHDL, LDLDIRECT in the last 72 hours. Thyroid Function Tests: No results for input(s): TSH, T4TOTAL, FREET4, T3FREE, THYROIDAB in the last 72 hours. Anemia Panel: No results for input(s): VITAMINB12, FOLATE, FERRITIN, TIBC, IRON, RETICCTPCT in the last 72 hours. Sepsis Labs: No results for input(s): PROCALCITON, LATICACIDVEN in the last 168 hours.  Recent Results (from the past 240 hour(s))  Urine Culture     Status: None   Collection Time: 02/01/20  8:40 PM   Specimen: Urine, Clean Catch  Result Value Ref Range Status   Specimen Description   Final    URINE, CLEAN CATCH Performed at [facility], [address] [place], [place] [address]    Special Requests   Final    NONE Performed at [facility], [address], [place], [place] [address]    Culture   Final    NO  GROWTH Performed at [facility], [place]  [address] [place],  [address]    Report Status 02/03/2020 FINAL  Final    Radiology Studies: DG Chest Port 1 View  Result Date: 02/07/2020 CLINICAL DATA:  Endotracheal tube and NG tube placement EXAM: PORTABLE CHEST 1 VIEW COMPARISON:  02/06/2020 FINDINGS: Endotracheal tube in good position. NG tube enters the stomach with the tip not visualized. Right pleural effusion and right lower lobe collapse unchanged. Mild left lower lobe atelectasis and pleural effusion has progressed. No pulmonary edema. IMPRESSION: Endotracheal tube in good position. Right lower lobe collapse and right pleural effusion unchanged. Progressive left lower lobe atelectasis and effusion. Electronically Signed   By: Franchot Gallo M.D.   On: 02/07/2020 15:52   DG Abd Portable 1V  Result Date: 02/07/2020 CLINICAL DATA:  NG tube placement. EXAM: PORTABLE ABDOMEN - 1 VIEW COMPARISON:  02/07/2020 FINDINGS: NG has been advanced. Tip is now in the gastric antrum. No dilated bowel loops identified. Bibasilar airspace disease right greater than left. IMPRESSION: NG has been advanced with the tip now in the gastric antrum. No dilated bowel loops. Electronically Signed   By: Franchot Gallo M.D.   On: 02/07/2020 15:53   DG Abd Portable 1V  Result Date: 02/07/2020 CLINICAL DATA:  OG tube placement. EXAM: PORTABLE ABDOMEN - 1 VIEW COMPARISON:  02/03/2020 FINDINGS: The tip of an enteric tube projects over the gastric body with side hole also below the diaphragm. Mild gaseous distension of small bowel loops in the left hemiabdomen has decreased from the prior study with the lower abdomen and pelvis not included on today's examination. Consolidation in the right lung base and a right pleural effusion were more fully evaluated on yesterday's chest radiographs. IMPRESSION: Enteric tube in the stomach. Electronically Signed   By: Logan Bores M.D.   On: 02/07/2020 10:15   Scheduled Meds: .  arformoterol  15 mcg Nebulization BID  . benztropine  0.5 mg Per Tube BID  . budesonide (PULMICORT) nebulizer solution  0.5 mg Nebulization BID  . chlorhexidine gluconate (MEDLINE KIT)  15 mL Mouth Rinse BID  . Chlorhexidine Gluconate Cloth  6 each Topical Daily  . cloNIDine  0.2 mg Per Tube TID  . diltiazem  60 mg Per Tube Q6H  . docusate  100 mg Oral BID  . feeding supplement (PROSource TF)  45 mL Per Tube TID  . folic acid  1 mg Per Tube Daily  . free water  100 mL Per Tube Q4H  . insulin aspart  0-9 Units Subcutaneous Q4H  . mouth rinse  15 mL Mouth Rinse 10 times per day  . mupirocin ointment   Nasal Daily  . nicotine  7 mg Transdermal Daily  . pantoprazole sodium  40 mg Per Tube Q24H  . polyethylene glycol  17 g Oral Daily  . saccharomyces boulardii  250 mg Per Tube BID  . thiamine  100 mg Oral Daily   Or  . thiamine  100 mg Intravenous Daily  . valproic acid  500 mg Per Tube BID   Continuous Infusions: . sodium chloride 10 mL/hr at 01/29/20 1401  .  ceFAZolin (ANCEF) IV 2 g (02/09/20 0533)  . feeding supplement (VITAL AF 1.2 CAL) 1,000 mL (02/08/20 2200)  . fentaNYL infusion INTRAVENOUS 150 mcg/hr (02/09/20 0403)  . heparin 2,450 Units/hr (02/09/20 0727)  . midazolam 2 mg/hr (02/09/20 0821)    LOS: 14 days   Critical Care Procedure Note Authorized and Performed by: Murvin Natal MD  Total Critical Care time:  36  mins Due to a high probability of clinically significant, life threatening deterioration, the patient required my highest level of preparedness to intervene emergently and I personally spent this critical care time directly and personally managing the patient.  This critical care time included obtaining a history; examining the patient, pulse oximetry; ordering and review of studies; arranging urgent treatment with development of a management plan; evaluation of patient's response of treatment; frequent reassessment; and discussions with other providers.  This critical  care time was performed to assess and manage the high probability of imminent and life threatening deterioration that could result in multi-organ failure.  It was exclusive of separately billable procedures and treating other patients and teaching time.   Irwin Brakeman, MD How to contact the [facility] Attending or Consulting provider [place] or covering provider during after hours [place], for this patient?  1. Check the care team in [facility] and look for a) attending/consulting [facility] provider listed and b) the [facility] team listed 2. Log into [email or web address] and use [place]'s universal password to access. If you do not have the password, please contact the hospital operator. 3. Locate the [facility] provider you are looking for under [facility] and page to a number that you can be directly reached. 4. If you still have difficulty reaching the provider, please page the [facility] (Director on Call) for the Hospitalists listed on amion for assistance.  If 7PM-7AM, please contact night-coverage [email or web address] 02/09/2020, 9:35 AM

## 2020-02-09 NOTE — Progress Notes (Addendum)
NAME:  [name] [name], MRN:  [id number], DOB:  05-04-1956, LOS: 64 ADMISSION DATE:  01/26/2020, CONSULTATION DATE:  01/28/20 REFERRING MD:  Ortiz/ Triad, CHIEF COMPLAINT:  resp distress    Brief History   64 yo female smoker presented 9/05 with Rt foot pain and swelling.  This was associated with chills, myalgias from sepsis with cellulitis.  Developed A fib with RV and then hypoxia.  Required intubation 9/07 and PCCM consulted.  H&P   [name] [name] is a 64 y.o. 64 year old female with past medical history patient at home, hypertension, type 2 diabetes,, tobacco abuse, questionable, and chronic substance abuse who presented to the emergency department.  Symptoms began after hours to 2 weeks prior to and imaging.  Patient also reported fever, chills, body aches, and mild headache with general malaise prior to admission.  Patient attempted soaking foot with little to no improvement prior to admission.  She denied any chest pain, shortness of breath,.  Initially on arrival patient was treated for sepsis and required mechanical intubation.  PCCM was consulted morning of 9/07 originally.  Extubation was attempted 9/17 patient became stridorous progressed with strokes requiring ventilation.  Patient was transferred to [facility], 9/19 for further evaluation of possible tracheostomy placement and continued treatment for sepsis.  Past Medical History  COPD on home oxygen, HTN, DM, Bipolar  Significant Hospital Events   9/05 Admit 9/07 intubated 9/08 back in sinus rhythm 9/14 fever, Abx changed 9/16 debridement of Rt 2nd toe with evacuation of abscess, superficial debridement of Rt lung with punch biopsy x 2 9/17 Extubate, required reintubation due to stridor distress 9/19 transferred to [facility] for further evaluation and possible tracheostomy  Consults:  Cardiology Surgery Palliative care  Procedures:  ETT 9/07 >> 9/17 9/17 >  Lt PICC 9/12 >>   Significant Diagnostic Tests:   Echo 9/08  >> EF 55 to 60%  CT Rt leg 9/11 >> diffuse subcutaneous soft tissue swelling, moderate myofasciitis involving mid to lower calf, no osteomyelitis or septic arthritis  CRX 9/17 > Right lower lobe collapse and right pleural effusion unchanged. Progressive left lower lobe atelectasis and effusion.  Micro Data:  COVID 9/05 >> negative MRSA PCR 9/08 >> negative Blood 9/05 >> Group B Streptococcus Blood 9/07 >> negative  Antimicrobials:  Vancomycin 9/05 Rocephin 9/05 Ancef 9/06 >> 9/10 Vancomycin 9/11 >> 9/15 Clindamycin 9/14 >>  Ancef 9/15 >>   Interim history/subjective:  Lying in bed In no acute distress Tolerating vent well   Objective   Blood pressure 108/61, pulse 63, temperature 98.9 F (37.2 C), resp. rate 15, height 5\' 5"  (1.651 m), weight 106.9 kg, SpO2 97 %.    Vent Mode: PRVC FiO2 (%):  [50 %-60 %] 60 % Set Rate:  [16 bmp] 16 bmp Vt Set:  [450 mL] 450 mL PEEP:  [5 cmH20] 5 cmH20 Pressure Support:  [15 cmH20] 15 cmH20 Plateau Pressure:  [19 cmH20-25 cmH20] 24 cmH20   Intake/Output Summary (Last 24 hours) at 02/09/2020 1553 Last data filed at 02/09/2020 1500 Gross per 24 hour  Intake 3640.51 ml  Output 1275 ml  Net 2365.51 ml   Filed Weights   02/06/20 0500 02/07/20 0500 02/09/20 0500  Weight: 105.1 kg 105.9 kg 106.9 kg    Examination: General: Chronically ill appearing elderly female on mechanical ventilation, in NAD HEENT: ETT, MM pink/moist, PERRL,  Neuro: Will track movement in room and attempts to follow commands on sedation  CV: s1s2 regular rate and rhythm, no murmur, rubs,  or gallops,  PULM:  Clear to ascultation, tolerating vent well,  GI: soft, bowel sounds active in all 4 quadrants, non-tender, non-distended Extremities: warm/dry, right lower extremity wrapped in ace bandage Skin: no rashes or lesions  Resolved Hospital Problem list      Assessment & Plan:   Acute on chronic hypoxic/hypercapnic respiratory failure from  -Secondary to acute  pulmonary edema in setting of A fib with RVR, COPD exacerbation, and mucus plugging. P: Continue ventilator support with lung protective strategies  Wean PEEP and FiO2 for sats greater than 90%. Head of bed elevated 30 degrees. Plateau pressures less than 30 cm H20.  Follow intermittent chest x-ray and ABG.   SAT/SBT as tolerated, mentation preclude extubation  Ensure adequate pulmonary hygiene  Follow cultures  VAP bundle in place  PAD protocol, currently on fent and versed can trial on precedex Will need to evaluate need for trach   Group B Streptococcal bacteremia  -Secondary to Rt lower leg cellulitis. -s/p debridement on 9/16 P: Continue antibiotics per primary team Wound care per surgery   Transient A fib with RVR. -Per chart review patient has remained in NRS P: Continuous telemetry   Acute metabolic encephalopathy from sepsis, hypoxia. Hx of ETOH, depression, anxiety, seizures. -Currently appears comfortable  P: Continue with PAD protoclo as above  Monitor for signs of withdrawal   Goals of care. P: Palliative care consulted   Dysphagia. P: SLP eval once extubated   Deconditioning. P: PT/OT evals when appropriate   Best practice:  Diet: NPO DVT prophylaxis: heparin gtt GI prophylaxis: protonix Mobility: bed rest Code Status: PCR  Disposition: ICU  Labs    CMP Latest Ref Rng & Units 02/09/2020 02/08/2020 02/07/2020  Glucose 70 - 99 mg/dL 145(H) 118(H) 131(H)  BUN 8 - 23 mg/dL 25(H) 30(H) 29(H)  Creatinine 0.44 - 1.00 mg/dL 0.35(L) 0.37(L) 0.39(L)  Sodium 135 - 145 mmol/L 139 140 139  Potassium 3.5 - 5.1 mmol/L 3.7 3.5 4.0  Chloride 98 - 111 mmol/L 91(L) 93(L) 93(L)  CO2 22 - 32 mmol/L 39(H) 39(H) 38(H)  Calcium 8.9 - 10.3 mg/dL 8.4(L) 8.5(L) 8.5(L)  Total Protein 6.5 - 8.1 g/dL 6.1(L) 6.2(L) 6.3(L)  Total Bilirubin 0.3 - 1.2 mg/dL 0.6 0.6 0.8  Alkaline Phos 38 - 126 U/L 112 112 116  AST 15 - 41 U/L 11(L) 10(L) 14(L)  ALT 0 - 44 U/L 14 17 22      CBC Latest Ref Rng & Units 02/09/2020 02/08/2020 02/07/2020  WBC 4.0 - 10.5 K/uL 5.9 7.6 7.4  Hemoglobin 12.0 - 15.0 g/dL 9.5(L) 9.7(L) 9.7(L)  Hematocrit 36 - 46 % 32.3(L) 33.3(L) 32.8(L)  Platelets 150 - 400 K/uL 179 200 179    ABG    Component Value Date/Time   PHART 7.412 02/02/2020 1352   PCO2ART 67.7 (HH) 02/02/2020 1352   PO2ART 98.0 02/02/2020 1352   HCO3 38.8 (H) 02/02/2020 1352   O2SAT 97.3 02/02/2020 1352    CBG (last 3)  Recent Labs    02/09/20 0805 02/09/20 1213 02/09/20 1509  GLUCAP 134* 142* 135*    Critical care time:   CRITICAL CARE Performed by: Johnsie Cancel  Total critical care time: 38 minutes  Critical care time was exclusive of separately billable procedures and treating other patients.  Critical care was necessary to treat or prevent imminent or life-threatening deterioration.  Critical care was time spent personally by me on the following activities: development of treatment plan with patient and/or surrogate as well  as nursing, discussions with consultants, evaluation of patient's response to treatment, examination of patient, obtaining history from patient or surrogate, ordering and performing treatments and interventions, ordering and review of laboratory studies, ordering and review of radiographic studies, pulse oximetry and re-evaluation of patient's condition.  Johnsie Cancel, NP-C [facility] Contact / Pager information can be found on Amion  02/09/2020, 4:21 PM

## 2020-02-10 ENCOUNTER — Inpatient Hospital Stay (HOSPITAL_COMMUNITY): Payer: Medicaid Other

## 2020-02-10 ENCOUNTER — Encounter (HOSPITAL_COMMUNITY): Payer: Self-pay | Admitting: General Surgery

## 2020-02-10 ENCOUNTER — Other Ambulatory Visit: Payer: Self-pay

## 2020-02-10 DIAGNOSIS — R0902 Hypoxemia: Secondary | ICD-10-CM

## 2020-02-10 DIAGNOSIS — E8770 Fluid overload, unspecified: Secondary | ICD-10-CM

## 2020-02-10 LAB — COMPREHENSIVE METABOLIC PANEL
ALT: 16 U/L (ref 0–44)
AST: 16 U/L (ref 15–41)
Albumin: 2.2 g/dL — ABNORMAL LOW (ref 3.5–5.0)
Alkaline Phosphatase: 143 U/L — ABNORMAL HIGH (ref 38–126)
Anion gap: 9 (ref 5–15)
BUN: 21 mg/dL (ref 8–23)
CO2: 37 mmol/L — ABNORMAL HIGH (ref 22–32)
Calcium: 8.9 mg/dL (ref 8.9–10.3)
Chloride: 92 mmol/L — ABNORMAL LOW (ref 98–111)
Creatinine, Ser: 0.35 mg/dL — ABNORMAL LOW (ref 0.44–1.00)
GFR calc Af Amer: >60 mL/min (ref >60)
GFR calc non Af Amer: >60 mL/min (ref >60)
Glucose, Bld: 181 mg/dL — ABNORMAL HIGH (ref 70–99)
Potassium: 4.5 mmol/L (ref 3.5–5.1)
Sodium: 138 mmol/L (ref 135–145)
Total Bilirubin: 0.5 mg/dL (ref 0.3–1.2)
Total Protein: 6.7 g/dL (ref 6.5–8.1)

## 2020-02-10 LAB — CBC WITH DIFFERENTIAL/PLATELET
Abs Immature Granulocytes: 0.11 10*3/uL — ABNORMAL HIGH (ref 0.00–0.07)
Basophils Absolute: 0.1 10*3/uL (ref 0.0–0.1)
Basophils Relative: 1 %
Eosinophils Absolute: 0 10*3/uL (ref 0.0–0.5)
Eosinophils Relative: 1 %
HCT: 33.5 % — ABNORMAL LOW (ref 36.0–46.0)
Hemoglobin: 9.8 g/dL — ABNORMAL LOW (ref 12.0–15.0)
Immature Granulocytes: 2 %
Lymphocytes Relative: 9 %
Lymphs Abs: 0.6 10*3/uL — ABNORMAL LOW (ref 0.7–4.0)
MCH: 27.6 pg (ref 26.0–34.0)
MCHC: 29.3 g/dL — ABNORMAL LOW (ref 30.0–36.0)
MCV: 94.4 fL (ref 80.0–100.0)
Monocytes Absolute: 0.2 10*3/uL (ref 0.1–1.0)
Monocytes Relative: 3 %
Neutro Abs: 5.4 10*3/uL (ref 1.7–7.7)
Neutrophils Relative %: 84 %
Platelets: 207 10*3/uL (ref 150–400)
RBC: 3.55 MIL/uL — ABNORMAL LOW (ref 3.87–5.11)
RDW: 14.6 % (ref 11.5–15.5)
WBC: 6.4 10*3/uL (ref 4.0–10.5)
nRBC: 0 % (ref 0.0–0.2)

## 2020-02-10 LAB — BODY FLUID CELL COUNT WITH DIFFERENTIAL
Eos, Fluid: 0 %
Lymphs, Fluid: 35 %
Monocyte-Macrophage-Serous Fluid: 26 % — ABNORMAL LOW (ref 50–90)
Neutrophil Count, Fluid: 39 % — ABNORMAL HIGH (ref 0–25)
Total Nucleated Cell Count, Fluid: 298 cu mm (ref 0–1000)

## 2020-02-10 LAB — GLUCOSE, CAPILLARY
Glucose-Capillary: 188 mg/dL — ABNORMAL HIGH (ref 70–99)
Glucose-Capillary: 186 mg/dL — ABNORMAL HIGH (ref 70–99)
Glucose-Capillary: 153 mg/dL — ABNORMAL HIGH (ref 70–99)
Glucose-Capillary: 190 mg/dL — ABNORMAL HIGH (ref 70–99)
Glucose-Capillary: 159 mg/dL — ABNORMAL HIGH (ref 70–99)

## 2020-02-10 LAB — MAGNESIUM: Magnesium: 2 mg/dL (ref 1.7–2.4)

## 2020-02-10 LAB — PHOSPHORUS: Phosphorus: 3.8 mg/dL (ref 2.5–4.6)

## 2020-02-10 LAB — HEPARIN LEVEL (UNFRACTIONATED): Heparin Unfractionated: 0.31 IU/mL (ref 0.30–0.70)

## 2020-02-10 MED ORDER — POTASSIUM CHLORIDE 20 MEQ/15ML (10%) PO SOLN
40.0000 meq | Freq: Two times a day (BID) | ORAL | Status: AC
  Administered 2020-02-10 – 2020-02-11 (×3): 40 meq via ORAL
  Filled 2020-02-10 (×2): qty 30

## 2020-02-10 MED ORDER — VITAL AF 1.2 CAL PO LIQD
1000.0000 mL | ORAL | Status: DC
  Administered 2020-02-10 – 2020-02-12 (×11): 1000 mL

## 2020-02-10 MED ORDER — PROSOURCE TF PO LIQD
45.0000 mL | Freq: Every day | ORAL | Status: DC
  Administered 2020-02-11 – 2020-02-12 (×2): 45 mL
  Filled 2020-02-10 (×2): qty 45

## 2020-02-10 MED ORDER — COLLAGENASE 250 UNIT/GM EX OINT: TOPICAL_OINTMENT | Freq: Every day | CUTANEOUS | Status: DC

## 2020-02-10 MED ORDER — FUROSEMIDE 10 MG/ML IJ SOLN
40.0000 mg | Freq: Four times a day (QID) | INTRAMUSCULAR | Status: AC
  Administered 2020-02-10 – 2020-02-11 (×6): 40 mg via INTRAVENOUS
  Filled 2020-02-10 (×6): qty 4

## 2020-02-10 MED ORDER — METOLAZONE 5 MG PO TABS
5.0000 mg | ORAL_TABLET | Freq: Once | ORAL | Status: AC
  Administered 2020-02-10: 5 mg via ORAL
  Filled 2020-02-10: qty 1

## 2020-02-10 NOTE — Progress Notes (Signed)
Initial Nutrition Assessment  DOCUMENTATION CODES:   Not applicable  INTERVENTION:   Tube feeding:  -Vital AF 1.2 @ 60 ml/hr via OG (1440 ml) -45 ml ProSource daily -Free water flushes 100 ml Q4   Provides: 1768 kcals, 119 grams protein, 1168 ml free water (1768 ml with flushes)  NUTRITION DIAGNOSIS:   Inadequate oral intake related to post-op healing as evidenced by estimated needs.  Ongoing  GOAL:   Patient will meet greater than or equal to 90% of their needs  Addressed via TF  MONITOR:   Vent status, Labs, I & O's, Skin, Weight trends, TF tolerance  REASON FOR ASSESSMENT:   Ventilator    ASSESSMENT:   Patient with PMH significant for COPD, DM, HTN, ETOH use, depression, and bipolar disorder. Presents to [facility] with R toe cellulitis and acute pulmonary edema. Transferred to [facility] for possible trach placement.   9/5- admit [facility] 9/7- intubation 9/16- debridement of R 2nd toe with evacuation of abscess and R lunch superficial debridement  9/17- failed extubation 9/19- transfer to [facility]  Pt discussed during ICU rounds and with RN.   Had broch this am, no obstruction or collapse. Holding off on trach for now. Had vomiting episode at [facility], now tolerating Vital AF 1.2 @ goal rate of 45 ml/hr. Rate adjusted to better meet needs.  OG confirmed in stomach per xray.   Admission weight: 100.4 kg  Current weight: 106.9 kg   Patient is currently intubated on ventilator support MV: 7.0 L/min Temp (24hrs), Avg:99.5 F (37.5 C), Min:98.2 F (36.8 C), Max:101.1 F (38.4 C)  UOP: 1,015 ml x 24 hrs   Medications: decadron, colace, folic acid, 40 mg lasix BID, SS novolog, miralax, thiamine  Labs: CBG 135-188  NUTRITION - FOCUSED PHYSICAL EXAM:    Most Recent Value  Orbital Region No depletion  Upper Arm Region No depletion  Thoracic and Lumbar Region Unable to assess  Buccal Region No depletion  Temple Region No depletion  Clavicle Bone Region No depletion   Clavicle and Acromion Bone Region No depletion  Scapular Bone Region Unable to assess  Dorsal Hand No depletion  Patellar Region No depletion  Anterior Thigh Region No depletion  Posterior Calf Region Unable to assess  Edema (RD Assessment) Unable to assess  Hair Reviewed  Eyes Unable to assess  Mouth Unable to assess  Skin Reviewed  Nails Reviewed     Diet Order:   Diet Order            Diet NPO time specified  Diet effective now                 EDUCATION NEEDS:   No education needs have been identified at this time  Skin:  Skin Assessment: Skin Integrity Issues: Other: non pressure R pretibial wound, non pressure R toe wound  Last BM:  9/14  Height:   Ht Readings from Last 1 Encounters:  02/09/20 5\' 5"  (1.651 m)    Weight:   Wt Readings from Last 1 Encounters:  02/09/20 106.9 kg    Ideal Body Weight:  56.8 kg  BMI:  Body mass index is 39.22 kg/m.  Estimated Nutritional Needs:   Kcal:  1797 kcal  Protein:  114-142 grams  Fluid:  >/= 1.7 L/day   Mariana Single RD, LDN Clinical Nutrition Pager listed in [place]

## 2020-02-10 NOTE — Consult Note (Addendum)
WOC Consult requested for a complex wound to right leg.  Pt previously went to the OR for debridement of right 2nd toe while at [facility] 3 days ago and was followed by the surgical team at that time; they ordered a biopsy and xeroform dressings.  A new surgical consult has been requested now that the patient has been transferred to [facility]. Please refer to their team for further plan of care. Please re-consult if further assistance is needed.  Thank-you,  Julien Girt MSN, [place], [place], [place], [place]

## 2020-02-10 NOTE — Progress Notes (Signed)
ANTICOAGULATION CONSULT NOTE - [facility] for Heparin Indication: atrial fibrillation  Allergies  Allergen Reactions  . Bactrim [Sulfamethoxazole-Trimethoprim] Anaphylaxis  . Amoxicillin Hives  . Diclofenac Tinitus    swelling in feet  . Morphine And Related Nausea And Vomiting and Other (See Comments)    headache  . Potassium-Containing Compounds Itching    Patient Measurements: Height: 5\' 5"  (165.1 cm) Weight: 106.9 kg (235 lb 10.8 oz) IBW/kg (Calculated) : 57 Heparin Dosing Weight:  80 kg  Vital Signs: Temp: 99.5 F (37.5 C) (09/20 0731) Temp Source: Oral (09/20 0328) BP: 116/64 (09/20 0800) Pulse Rate: 57 (09/20 0800)  Labs: Recent Labs    02/08/20 0442 02/08/20 0442 02/08/20 0443 02/08/20 1617 02/09/20 0615 02/10/20 0138  HGB 9.7*   < >  --   --  9.5* 9.8*  HCT 33.3*  --   --   --  32.3* 33.5*  PLT 200  --   --   --  179 207  HEPARINUNFRC  --   --    < > 0.42 0.51 0.31  CREATININE 0.37*  --   --   --  0.35* 0.35*   < > = values in this interval not displayed.    Estimated Creatinine Clearance: 87.5 mL/min (A) (by C-G formula based on SCr of 0.35 mg/dL (L)).  Assessment: Pharmacy was consulted to dose heparin in 64 yr old female with atrial fibrillation. Patient was not on anticoagulation prior to admission.  Heparin level at low end of goal this morning at 0.31. CBC stable. No bleeding issues noted.    Goal of Therapy:  Heparin level 0.3-0.7 units/ml Monitor platelets by anticoagulation protocol: Yes   Plan:  Increase heparin infusion rate to 2500 units/hr Monitor anti-Xa level  daily heparin level, CBC  Erin Hearing PharmD., BCPS Clinical Pharmacist 02/10/2020 9:54 AM

## 2020-02-10 NOTE — Procedures (Signed)
Bronchoscopy Procedure Note  [name] [name]  [id number]  02-Jan-1956  Date:02/10/20  Time:9:50 AM   Provider Performing:Arpi Diebold L Georgie Haque   Procedure(s):  Flexible bronchoscopy with bronchial alveolar lavage (35597)  Indication(s) RLL collapse on CXR   Consent Risks of the procedure as well as the alternatives and risks of each were explained to the patient and/or caregiver.  Consent for the procedure was obtained and is signed in the bedside chart  Anesthesia Versed and Fentanyl   Time Out Verified patient identification, verified procedure, site/side was marked, verified correct patient position, special equipment/implants available, medications/allergies/relevant history reviewed, required imaging and test results available.  Sterile Technique Usual hand hygiene, masks, gowns, and gloves were used  Procedure Description Bronchoscope advanced through endotracheal tube and into airway.  Airways were examined down to subsegmental level with findings noted below.   Following diagnostic evaluation, BAL(s) performed in RML with normal saline and return of moderate fluid  Findings:  Mucus plugging in the right mainstem.  Suctioned clear therapeutically   Complications/Tolerance None; patient tolerated the procedure well. Chest X-ray is needed post procedure.  EBL None   Specimen(s) BAL to RML sent for cultures  Garner Nash, DO [facility] 02/10/2020 11:05 AM

## 2020-02-10 NOTE — Progress Notes (Signed)
PCCM:  Look with [place] on right chest. Not enough fluid to consider drainage or catheter placement  Garner Nash, DO [facility] 02/10/2020 9:58 AM

## 2020-02-10 NOTE — Progress Notes (Addendum)
NAME:  [name] [name], MRN:  [id number], DOB:  February 05, 1956, LOS: 26 ADMISSION DATE:  01/26/2020, CONSULTATION DATE:  01/28/20 REFERRING MD:  Ortiz/ Triad, CHIEF COMPLAINT:  resp distress    Brief History   64 yo female smoker presented 9/05 with Rt foot pain and swelling.  This was associated with chills, myalgias from sepsis with cellulitis.  Developed A fib with RV and then hypoxia.  Required intubation 9/07 and PCCM consulted.  H&P   [name] [name] is a 64 y.o. 64 year old female with past medical history patient at home, hypertension, type 2 diabetes,, tobacco abuse, questionable, and chronic substance abuse who presented to the emergency department.  Symptoms began after hours to 2 weeks prior to and imaging.  Patient also reported fever, chills, body aches, and mild headache with general malaise prior to admission.  Patient attempted soaking foot with little to no improvement prior to admission.  She denied any chest pain, shortness of breath,.  Initially on arrival patient was treated for sepsis and required mechanical intubation.  PCCM was consulted morning of 9/07 originally.  Extubation was attempted 9/17 patient became stridorous progressed with strokes requiring ventilation.  Patient was transferred to [facility], 9/19 for further evaluation of possible tracheostomy placement and continued treatment for sepsis.  Past Medical History  COPD on home oxygen, HTN, DM, Bipolar  Significant Hospital Events   9/05 Admit 9/07 intubated 9/08 back in sinus rhythm 9/14 fever, Abx changed 9/16 debridement of Rt 2nd toe with evacuation of abscess, superficial debridement of Rt lung with punch biopsy x 2 9/17 Extubate, required reintubation due to stridor distress 9/19 transferred to [facility] for further evaluation and possible tracheostomy  Consults:  Cardiology Surgery Palliative care  Procedures:  ETT 9/07 >> 9/17 9/17 >  Lt PICC 9/12 >>   Significant Diagnostic Tests:  Echo 9/08 >>  EF 55 to 60% CT Rt leg 9/11 >> diffuse subcutaneous soft tissue swelling, moderate myofasciitis involving mid to lower calf, no osteomyelitis or septic arthritis CRX 9/17 > Right lower lobe collapse and right pleural effusion unchanged. Progressive left lower lobe atelectasis and effusion. Bronch 9/20 > clear secretions, no obstruction or collapse POCUS bedside chest [place] 9/20 > minimal fluid  Micro Data:  COVID 9/05 >> negative MRSA PCR 9/08 >> negative Blood 9/05 >> Group B Streptococcus Blood 9/07 >> negative BAL 9/20 >   Antimicrobials:  Vancomycin 9/05 Rocephin 9/05 Ancef 9/06 >> 9/10 Vancomycin 9/11 >> 9/15 Clindamycin 9/14 >>  Ancef 9/15 >>   Interim history/subjective:  Awake despite heavy sedation. Bronch this AM was non-diagnostic. POCUS chest US showed minimal right pleural fluid, some atx.  Objective   Blood pressure 104/61, pulse (!) 57, temperature 99.5 F (37.5 C), resp. rate 16, height _0  (1.651 m), weight 106.9 kg, SpO2 98 %.    Vent Mode: PRVC FiO2 (%):  [50 %-100 %] 100 % Set Rate:  [16 bmp] 16 bmp Vt Set:  [450 mL] 450 mL PEEP:  [5 cmH20] 5 cmH20 Pressure Support:  [15 cmH20] 15 cmH20 Plateau Pressure:  [22 cmH20-25 cmH20] 22 cmH20   Intake/Output Summary (Last 24 hours) at 02/10/2020 1043 Last data filed at 02/10/2020 0800 Gross per 24 hour  Intake 2491.59 ml  Output 1015 ml  Net 1476.59 ml   Filed Weights   02/06/20 0500 02/07/20 0500 02/09/20 0500  Weight: 105.1 kg 105.9 kg 106.9 kg    Examination: General: Chronically ill appearing elderly female on mechanical ventilation, in NAD HEENT: ETT,  MM pink/moist, PERRL. Neuro: Opens eyes and attempts to follow commands on sedation  CV: RRR, no M/R/G PULM:  Clear to ascultation, tolerating vent well GI: soft, bowel sounds active in all 4 quadrants, non-tender, non-distended Extremities: warm/dry, right lower extremity wrapped in ace bandage Skin: no rashes or lesions  Assessment & Plan:    Acute on chronic hypoxic/hypercapnic respiratory failure - Secondary to acute pulmonary edema in setting of A fib with RVR, COPD exacerbation, and mucus plugging.  Bronch 9/20 showed no obstruction or collapse.  POCUS chest US showed minimal fluid / effusion on right Stridor - post intubation. P: Continue ventilator support Wean as able Diuresis Follow BAL culture No role for thora or chest tube drainage CXR Defer trach for now, will follow over next few days and see how she progresses with diuresis etc.  Also did have discussion with daughter stating that prognosis with trach would be poor given pt's debilitated state, hx COPD with chronic O2 dependence, ongoing tobacco use, etc.  Daughter understanding and is going to discuss things further with family today.  Group B Streptococcal bacteremia - Secondary to Rt lower leg cellulitis s/p debridement on 9/16. P: Continue abx. Wound care per surgery.  Transient A fib with RVR. P: Supportive care. Continue heparin gtt, diltiazem.  Acute metabolic encephalopathy from sepsis, hypoxia. Hx of ETOH, depression, anxiety, seizures. -Currently appears comfortable  P: Continue with PAD protocol. Thiamine / Folate. Monitor for signs of withdrawal.   Goals of care. P: Palliative care consulted.   Dysphagia. P: SLP eval once extubated.  Deconditioning. P: PT/OT evals when appropriate.   Best practice:  Diet: TFs DVT prophylaxis: heparin gtt GI prophylaxis: protonix Mobility: bed rest Code Status: Partial (no CPR) Disposition: ICU Family updates:  Daughter updated at bedside.  CC time: 40 min.   [name] [name], [name] 02/10/2020, 11:06 AM    PCCM:  64 year old current smoker.  Per daughter lives at home, watches TV.  Does not necessary to complete her ADLs.  Does not leave the house.  Has oxygen dependent chronic hypoxemic respiratory failure secondary to COPD at baseline.  Still  smoking.  [facility]ially admitted to [facility].  She had debridement of the right toe for an abscess.  She developed respiratory failure was reintubated on the 17th.  She had stridor.  Also chest x-ray with right lower lobe collapse.  She had a normal chest x-ray on the 10th.  BP 104/61   Pulse (!) 57   Temp 99.5 F (37.5 C)   Resp 16   Ht _0  (1.651 m)   Wt 106.9 kg   SpO2 98%   BMI 39.22 kg/m   General: Elderly female intubated on mechanical life support HEENT: Tracking appropriately Heart: Regular rhythm S1-S2 Lungs: Bilateral mechanically really breath sounds  Labs: Reviewed in epic, sodium 138, white blood cell count 6.4, hemoglobin 9.8.  Chest x-ray: Right lower lobe collapse, possible small effusion, some shift of the mediastinum. The patient's images have been independently reviewed by me.    Assessment: Acute hypoxemic respiratory failure requiring intubation mechanical ventilation. Right lower lobe collapse, possible small effusion. History of COPD, chronic hypoxemic respiratory failure at baseline on home oxygen therapy. Group B streptococcus bacteremia cellulitis debridement of right lower leg. A. fib with RVR appears to be transient on heparin rate controlled with diltiazem. Acute metabolic cephalopathy secondary to above. History of alcohol use, depression and anxiety.,  Patient is currently on  5 mg of Versed an hour and is awake. Positive cumulative fluid balance  Plan: Bronchoscopy today please see separate procedure note Bedside ultrasound of right chest with minimal effusion no need to tap or place pigtail at this time. Repeat chest x-ray pending Wound care for lower extremity Continue antimicrobials. Heparin continued Sedation plan to stop Versed infusion/titrate off Start Precedex Continue thiamine folate.  Scheduled diuresis with potassium replacement. Follow UOP   This patient is critically ill with multiple organ system failure; which,  requires frequent high complexity decision making, assessment, support, evaluation, and titration of therapies. This was completed through the application of advanced monitoring technologies and extensive interpretation of multiple databases. During this encounter critical care time was devoted to patient care services described in this note for 34 minutes.  [facility] 02/10/2020 11:21 AM

## 2020-02-10 NOTE — Procedures (Signed)
Bedside Bronchoscopy Procedure Note [name] [name] [id number] 1955-07-21  Procedure: Bronchoscopy Indications: Diagnostic evaluation of the airways and Obtain specimens for culture and/or other diagnostic studies  Procedure Details: ET Tube Size: 7.5 ET Tube secured at lip (cm): 23 Bite block in place: No In preparation for procedure, Patient hyper-oxygenated with 100 % FiO2 and Saline given via ETT (40 ml) Airway entered and the following bronchi were examined: RUL, RML, RLL, LUL, LLL and Bronchi.   Bronchoscope removed.  , Patient remained on 100% FiO2 throughout procedure.    Evaluation BP 116/64   Pulse (!) 57   Temp 99.5 F (37.5 C)   Resp 16   Ht 5\' 5"  (1.651 m)   Wt 106.9 kg   SpO2 97%   BMI 39.22 kg/m  Breath Sounds:Rhonch O2 sats: stable throughout Patient's Current Condition: stable Specimens:  Sent  Complications: No apparent complications Patient did tolerate procedure well.   Kathie Dike 02/10/2020, 9:55 AM

## 2020-02-11 DIAGNOSIS — J9600 Acute respiratory failure, unspecified whether with hypoxia or hypercapnia: Secondary | ICD-10-CM

## 2020-02-11 LAB — COMPREHENSIVE METABOLIC PANEL
ALT: 15 U/L (ref 0–44)
AST: 17 U/L (ref 15–41)
Albumin: 2.4 g/dL — ABNORMAL LOW (ref 3.5–5.0)
Alkaline Phosphatase: 126 U/L (ref 38–126)
Anion gap: 14 (ref 5–15)
BUN: 23 mg/dL (ref 8–23)
CO2: 34 mmol/L — ABNORMAL HIGH (ref 22–32)
Calcium: 9 mg/dL (ref 8.9–10.3)
Chloride: 86 mmol/L — ABNORMAL LOW (ref 98–111)
Creatinine, Ser: 0.37 mg/dL — ABNORMAL LOW (ref 0.44–1.00)
GFR calc Af Amer: >60 mL/min (ref >60)
GFR calc non Af Amer: >60 mL/min (ref >60)
Glucose, Bld: 202 mg/dL — ABNORMAL HIGH (ref 70–99)
Potassium: 4.8 mmol/L (ref 3.5–5.1)
Sodium: 134 mmol/L — ABNORMAL LOW (ref 135–145)
Total Bilirubin: 0.5 mg/dL (ref 0.3–1.2)
Total Protein: 6.8 g/dL (ref 6.5–8.1)

## 2020-02-11 LAB — CBC WITH DIFFERENTIAL/PLATELET
Abs Immature Granulocytes: 0.1 10*3/uL — ABNORMAL HIGH (ref 0.00–0.07)
Basophils Absolute: 0 10*3/uL (ref 0.0–0.1)
Basophils Relative: 1 %
Eosinophils Absolute: 0 10*3/uL (ref 0.0–0.5)
Eosinophils Relative: 0 %
HCT: 33.4 % — ABNORMAL LOW (ref 36.0–46.0)
Hemoglobin: 10.3 g/dL — ABNORMAL LOW (ref 12.0–15.0)
Immature Granulocytes: 2 %
Lymphocytes Relative: 11 %
Lymphs Abs: 0.7 10*3/uL (ref 0.7–4.0)
MCH: 28.4 pg (ref 26.0–34.0)
MCHC: 30.8 g/dL (ref 30.0–36.0)
MCV: 92 fL (ref 80.0–100.0)
Monocytes Absolute: 0.3 10*3/uL (ref 0.1–1.0)
Monocytes Relative: 5 %
Neutro Abs: 4.8 10*3/uL (ref 1.7–7.7)
Neutrophils Relative %: 81 %
Platelets: 208 10*3/uL (ref 150–400)
RBC: 3.63 MIL/uL — ABNORMAL LOW (ref 3.87–5.11)
RDW: 14.5 % (ref 11.5–15.5)
WBC: 5.8 10*3/uL (ref 4.0–10.5)
nRBC: 0 % (ref 0.0–0.2)

## 2020-02-11 LAB — PHOSPHORUS: Phosphorus: 3.4 mg/dL (ref 2.5–4.6)

## 2020-02-11 LAB — GLUCOSE, CAPILLARY
Glucose-Capillary: 159 mg/dL — ABNORMAL HIGH (ref 70–99)
Glucose-Capillary: 185 mg/dL — ABNORMAL HIGH (ref 70–99)
Glucose-Capillary: 188 mg/dL — ABNORMAL HIGH (ref 70–99)
Glucose-Capillary: 191 mg/dL — ABNORMAL HIGH (ref 70–99)
Glucose-Capillary: 197 mg/dL — ABNORMAL HIGH (ref 70–99)
Glucose-Capillary: 201 mg/dL — ABNORMAL HIGH (ref 70–99)

## 2020-02-11 LAB — HEPARIN LEVEL (UNFRACTIONATED): Heparin Unfractionated: 0.41 IU/mL (ref 0.30–0.70)

## 2020-02-11 LAB — MAGNESIUM: Magnesium: 1.7 mg/dL (ref 1.7–2.4)

## 2020-02-11 LAB — SURGICAL PATHOLOGY

## 2020-02-11 MED ORDER — THIAMINE HCL 100 MG/ML IJ SOLN
100.0000 mg | Freq: Every day | INTRAMUSCULAR | Status: DC
  Administered 2020-02-12 – 2020-02-23 (×7): 100 mg via INTRAVENOUS
  Filled 2020-02-11 (×10): qty 2

## 2020-02-11 MED ORDER — CHLORHEXIDINE GLUCONATE CLOTH 2 % EX PADS
6.0000 | MEDICATED_PAD | Freq: Every day | CUTANEOUS | Status: DC
  Administered 2020-02-11 – 2020-02-18 (×7): 6 via TOPICAL

## 2020-02-11 MED ORDER — METOLAZONE 5 MG PO TABS
5.0000 mg | ORAL_TABLET | Freq: Once | ORAL | Status: AC
  Administered 2020-02-11: 5 mg
  Filled 2020-02-11: qty 1

## 2020-02-11 MED ORDER — THIAMINE HCL 100 MG PO TABS
100.0000 mg | ORAL_TABLET | Freq: Every day | ORAL | Status: DC
  Administered 2020-02-18 – 2020-02-26 (×7): 100 mg
  Filled 2020-02-11 (×8): qty 1

## 2020-02-11 MED ORDER — DOCUSATE SODIUM 50 MG/5ML PO LIQD
100.0000 mg | Freq: Two times a day (BID) | ORAL | Status: DC
  Administered 2020-02-12 – 2020-02-19 (×4): 100 mg
  Filled 2020-02-11 (×9): qty 10

## 2020-02-11 MED ORDER — POTASSIUM CHLORIDE 20 MEQ/15ML (10%) PO SOLN
40.0000 meq | Freq: Once | ORAL | Status: AC
  Administered 2020-02-11: 40 meq
  Filled 2020-02-11: qty 30

## 2020-02-11 NOTE — Progress Notes (Signed)
ANTICOAGULATION CONSULT NOTE - [place] for Heparin Indication: atrial fibrillation  Allergies  Allergen Reactions  . Bactrim [Sulfamethoxazole-Trimethoprim] Anaphylaxis  . Amoxicillin Hives  . Diclofenac Tinitus    swelling in feet  . Morphine And Related Nausea And Vomiting and Other (See Comments)    headache  . Potassium-Containing Compounds Itching    Patient Measurements: Height: 5\' 5"  (165.1 cm) Weight: 102.4 kg (225 lb 12 oz) IBW/kg (Calculated) : 57 Heparin Dosing Weight:  80 kg  Vital Signs: Temp: 97.8 F (36.6 C) (09/21 0806) Temp Source: Oral (09/21 0806) BP: 123/65 (09/21 0900) Pulse Rate: 54 (09/21 0915)  Labs: Recent Labs    02/09/20 0615 02/09/20 0615 02/10/20 0138 02/11/20 0039  HGB 9.5*   < > 9.8* 10.3*  HCT 32.3*  --  33.5* 33.4*  PLT 179  --  207 208  HEPARINUNFRC 0.51  --  0.31 0.41  CREATININE 0.35*  --  0.35* 0.37*   < > = values in this interval not displayed.    Estimated Creatinine Clearance: 85.4 mL/min (A) (by C-G formula based on SCr of 0.37 mg/dL (L)).  Assessment: Pharmacy was consulted to dose heparin in 64 yr old female with atrial fibrillation. Patient was not on anticoagulation prior to admission.  Heparin level at goal this morning at 0.41. CBC stable. No bleeding issues noted.    Goal of Therapy:  Heparin level 0.3-0.7 units/ml Monitor platelets by anticoagulation protocol: Yes   Plan:  Continue heparin infusion at 2500 units/hr Monitor anti-Xa level  daily heparin level, CBC  Erin Hearing PharmD., BCPS Clinical Pharmacist 02/11/2020 11:09 AM

## 2020-02-11 NOTE — Progress Notes (Signed)
NAME:  [name] [name], MRN:  [id number], DOB:  04-Nov-1955, LOS: 58 ADMISSION DATE:  01/26/2020, CONSULTATION DATE:  01/28/20 REFERRING MD:  [place]/ [facility], CHIEF COMPLAINT:  resp distress    Brief History   64 yo female smoker presented 9/05 with Rt foot pain and swelling.  This was associated with chills, myalgias from sepsis with cellulitis.  Developed A fib with RV and then hypoxia.  Required intubation 9/07 and PCCM consulted.  H&P   [name] [name] is a 64 y.o. 64 year old female with past medical history patient at home, hypertension, type 2 diabetes,, tobacco abuse, questionable, and chronic substance abuse who presented to the emergency department.  Symptoms began after hours to 2 weeks prior to and imaging.  Patient also reported fever, chills, body aches, and mild headache with general malaise prior to admission.  Patient attempted soaking foot with little to no improvement prior to admission.  She denied any chest pain, shortness of breath,.  Initially on arrival patient was treated for sepsis and required mechanical intubation.  PCCM was consulted morning of 9/07 originally.  Extubation was attempted 9/17 patient became stridorous progressed with strokes requiring ventilation.  Patient was transferred to [facility], 9/19 for further evaluation of possible tracheostomy placement and continued treatment for sepsis.  Past Medical History  COPD on home oxygen, HTN, DM, Bipolar  Significant Hospital Events   9/05 Admit 9/07 intubated 9/08 back in sinus rhythm 9/14 fever, Abx changed 9/16 debridement of Rt 2nd toe with evacuation of abscess, superficial debridement of Rt lung with punch biopsy x 2 9/17 Extubate, required reintubation due to stridor distress 9/19 transferred to [facility] for further evaluation and possible tracheostomy  Consults:  Cardiology Surgery Palliative care  Procedures:  ETT 9/07 >> 9/17 9/17 >  Lt PICC 9/12 >>   Significant Diagnostic Tests:  Echo 9/08 >>  EF 55 to 60% CT Rt leg 9/11 >> diffuse subcutaneous soft tissue swelling, moderate myofasciitis involving mid to lower calf, no osteomyelitis or septic arthritis CRX 9/17 > Right lower lobe collapse and right pleural effusion unchanged. Progressive left lower lobe atelectasis and effusion. Bronch 9/20 > clear secretions, no obstruction or collapse POCUS bedside chest [place] 9/20 > minimal fluid  Micro Data:  COVID 9/05 >> negative MRSA PCR 9/08 >> negative Blood 9/05 >> Group B Streptococcus Blood 9/07 >> negative BAL 9/20 >   Antimicrobials:  Vancomycin 9/05 Rocephin 9/05 Ancef 9/06 >> 9/10 Vancomycin 9/11 >> 9/15 Clindamycin 9/14 >>  Ancef 9/15 >>   Interim history/subjective:   No issues overnight.  Afebrile diuresed well with good urine output.  Objective   Blood pressure 123/65, pulse (!) 54, temperature 97.8 F (36.6 C), temperature source Oral, resp. rate 16, height _0  (1.651 m), weight 102.4 kg, SpO2 94 %.    Vent Mode: PRVC FiO2 (%):  [50 %-60 %] 50 % Set Rate:  [16 bmp] 16 bmp Vt Set:  [450 mL] 450 mL PEEP:  [5 cmH20] 5 cmH20 Plateau Pressure:  [21 cmH20-25 cmH20] 23 cmH20   Intake/Output Summary (Last 24 hours) at 02/11/2020 0945 Last data filed at 02/11/2020 0900 Gross per 24 hour  Intake 3749.96 ml  Output 6130 ml  Net -2380.04 ml   Filed Weights   02/07/20 0500 02/09/20 0500 02/11/20 0500  Weight: 105.9 kg 106.9 kg 102.4 kg    Examination: General: Chronically ill-appearing female on mechanical ventilation HEENT: Endotracheal tube in place, tracking appropriately Neuro: Opens eyes to voice, attempts to follow commands CV:  Regular rate rhythm, S1-S2 PULM: Clear to auscultation bilaterally no crackles no wheeze, bilateral mechanically ventilated breath sounds GI: Soft bowel sounds present nontender nondistended Extremities: Right leg wrapped trace edema  Assessment & Plan:   Acute on chronic hypoxic/hypercapnic respiratory failure  - Secondary to  acute pulmonary edema in setting of A fib with RVR, COPD exacerbation, and mucus plugging.   - Bronch 9/20 showed no obstruction or collapse.  POCUS chest US showed minimal fluid / effusion on right  Stridor on the 9/17 which led to reintubation. She was also significantly fluid positive. P:  Adult ventilator protocol Continue to wean FiO2 to maintain SPO2 greater than 90% Follow-up BAL culture results from 02/10/2020 Follow chest x-ray Continue diuresis as she is +20 L. She had good response to Lasix yesterday at 5.8 L out.  Continue to follow electrolytes There was discussion with family at some point about tracheostomy tube.  At this point daughter is not really sure that she would want that mainly due to prolonged mechanical needs as well as facility care.  Group B Streptococcal bacteremia - Secondary to Rt lower leg cellulitis s/p debridement on 9/16. 01/29/2020: Surface echo negative normal EF no veg. P: Continue antimicrobials, continue Ancef. Wound care consultation/ Surgery follow up? I will reach out to them  Transient A fib with RVR. P: Rate controlled Continue heparin Believe we can consider stopping this.  Since it appears A. fib was transient.  Will discuss with pharmacy.  Acute metabolic encephalopathy from sepsis, hypoxia. Hx of ETOH, depression, anxiety, seizures. -Currently appears comfortable  P: Remains comfortable on low-dose Versed plus fentanyl Attempted to use Precedex but patient become bradycardic and this was discontinued.  Goals of care. P: Palliative care following Patient's functional status at baseline she does not leave the home.  Sits and watches TV most of the day.  Oxygen dependent chronic respiratory failure and COPD.  History of dysphagia. P: SLP eval once extubated  Deconditioning. P: We will need PT OT at some point  Best practice:  Diet: TFs DVT prophylaxis: heparin gtt GI prophylaxis: protonix Mobility: bed rest Code Status:  Partial (no CPR) Disposition: ICU Family updates: Daughter updated  This patient is critically ill with multiple organ system failure; which, requires frequent high complexity decision making, assessment, support, evaluation, and titration of therapies. This was completed through the application of advanced monitoring technologies and extensive interpretation of multiple databases. During this encounter critical care time was devoted to patient care services described in this note for 34 minutes.  Garner Nash, DO [facility] 02/11/2020 9:45 AM

## 2020-02-11 NOTE — Consult Note (Signed)
[facility] Nurse Consult Note: I communicated with Dr. Valeta Harms via [place].  The [facility] team understands surgery is directing care on the leg and foot; and, there are orders in the chart. Dr. Valeta Harms to reach out to surgery. Reason for Consult: cellulitis lower extremity. Val Riles, RN, MSN, CWOCN, CNS-BC, pager [phone]

## 2020-02-11 NOTE — Progress Notes (Signed)
Patient ID: [name] [name], female   DOB: Oct 11, 1955, 64 y.o.   MRN: [id number]  This NP visited patient at the bedside as a follow up for palliative medicine needs and emotional support.  Patient is oxygen dependant at home/COPD,  currently intubated.  Today is day 16 of this hospital stay  Meet at bedside as scheduled with daughter/[name] Pricilla Handler continued conversation regarding current medical situation.  Education regarding there seriousness of the current medical situation.  Questions and concerns were addressed.     I spoke to her other daughter/ Hoover Browns by telephone for repeat conversation as above.  Plan of Care: - continue with all offered and available medical interventions to prolong life, family is hopeful for improvement - daughters agree that patient would not desire trach now or in the future.  I discussed the importance and consideration of decision related to reintubation in the future.   This will need ongoing conversation - treatment  to preserve right foot and leg, avoid amputation "at all cost" -rehab may be indicated when stable for discharge  [name] shares with me that she is under "a lot of stress" dealing with her substance abuse disorder.  She has a 57 yo daughter.   Emotional support offered  Discussed with daughters  the importance of continued conversation with each other and the medical providers regarding overall plan of care and treatment options,  ensuring decisions are within the context of the patients values and GOCs.  Questions and concerns addressed   Discussed with Dr Valeta Harms and bedsdie RN/Andruw  Total time spent on the unit was 35 minutes    PMT will continue to support holisically  Greater than 50% of the time was spent in counseling and coordination of care  [name] NP  Palliative Medicine Team Team Phone # [phone] Pager [phone]

## 2020-02-12 ENCOUNTER — Telehealth: Payer: Self-pay | Admitting: Podiatry

## 2020-02-12 DIAGNOSIS — R6521 Severe sepsis with septic shock: Secondary | ICD-10-CM

## 2020-02-12 DIAGNOSIS — M726 Necrotizing fasciitis: Secondary | ICD-10-CM

## 2020-02-12 LAB — CBC WITH DIFFERENTIAL/PLATELET
Abs Immature Granulocytes: 0.14 10*3/uL — ABNORMAL HIGH (ref 0.00–0.07)
Basophils Absolute: 0.1 10*3/uL (ref 0.0–0.1)
Basophils Relative: 1 %
Eosinophils Absolute: 0 10*3/uL (ref 0.0–0.5)
Eosinophils Relative: 0 %
HCT: 39.4 % (ref 36.0–46.0)
Hemoglobin: 12.4 g/dL (ref 12.0–15.0)
Immature Granulocytes: 2 %
Lymphocytes Relative: 23 %
Lymphs Abs: 1.5 10*3/uL (ref 0.7–4.0)
MCH: 28.2 pg (ref 26.0–34.0)
MCHC: 31.5 g/dL (ref 30.0–36.0)
MCV: 89.5 fL (ref 80.0–100.0)
Monocytes Absolute: 0.5 10*3/uL (ref 0.1–1.0)
Monocytes Relative: 7 %
Neutro Abs: 4.4 10*3/uL (ref 1.7–7.7)
Neutrophils Relative %: 67 %
Platelets: 182 10*3/uL (ref 150–400)
RBC: 4.4 MIL/uL (ref 3.87–5.11)
RDW: 14.2 % (ref 11.5–15.5)
WBC: 6.6 10*3/uL (ref 4.0–10.5)
nRBC: 0 % (ref 0.0–0.2)

## 2020-02-12 LAB — GLUCOSE, CAPILLARY
Glucose-Capillary: 117 mg/dL — ABNORMAL HIGH (ref 70–99)
Glucose-Capillary: 120 mg/dL — ABNORMAL HIGH (ref 70–99)
Glucose-Capillary: 153 mg/dL — ABNORMAL HIGH (ref 70–99)
Glucose-Capillary: 188 mg/dL — ABNORMAL HIGH (ref 70–99)
Glucose-Capillary: 192 mg/dL — ABNORMAL HIGH (ref 70–99)
Glucose-Capillary: 80 mg/dL (ref 70–99)
Glucose-Capillary: 95 mg/dL (ref 70–99)

## 2020-02-12 LAB — HEPARIN LEVEL (UNFRACTIONATED): Heparin Unfractionated: 0.28 IU/mL — ABNORMAL LOW (ref 0.30–0.70)

## 2020-02-12 LAB — CULTURE, RESPIRATORY W GRAM STAIN

## 2020-02-12 LAB — CREATININE, SERUM
Creatinine, Ser: 0.4 mg/dL — ABNORMAL LOW (ref 0.44–1.00)
GFR calc Af Amer: >60 mL/min (ref >60)
GFR calc non Af Amer: >60 mL/min (ref >60)

## 2020-02-12 LAB — BASIC METABOLIC PANEL
Anion gap: 10 (ref 5–15)
BUN: 24 mg/dL — ABNORMAL HIGH (ref 8–23)
CO2: 39 mmol/L — ABNORMAL HIGH (ref 22–32)
Calcium: 9.4 mg/dL (ref 8.9–10.3)
Chloride: 83 mmol/L — ABNORMAL LOW (ref 98–111)
Creatinine, Ser: 0.43 mg/dL — ABNORMAL LOW (ref 0.44–1.00)
GFR calc Af Amer: >60 mL/min (ref >60)
GFR calc non Af Amer: >60 mL/min (ref >60)
Glucose, Bld: 155 mg/dL — ABNORMAL HIGH (ref 70–99)
Potassium: 3.8 mmol/L (ref 3.5–5.1)
Sodium: 132 mmol/L — ABNORMAL LOW (ref 135–145)

## 2020-02-12 LAB — TRIGLYCERIDES: Triglycerides: 195 mg/dL — ABNORMAL HIGH (ref <150)

## 2020-02-12 LAB — MAGNESIUM: Magnesium: 1.5 mg/dL — ABNORMAL LOW (ref 1.7–2.4)

## 2020-02-12 MED ORDER — METOLAZONE 5 MG PO TABS
5.0000 mg | ORAL_TABLET | Freq: Once | ORAL | Status: AC
  Administered 2020-02-12: 5 mg
  Filled 2020-02-12: qty 1

## 2020-02-12 MED ORDER — POTASSIUM CHLORIDE 20 MEQ/15ML (10%) PO SOLN
40.0000 meq | Freq: Once | ORAL | Status: AC
  Administered 2020-02-12: 40 meq
  Filled 2020-02-12: qty 30

## 2020-02-12 MED ORDER — FUROSEMIDE 10 MG/ML IJ SOLN
40.0000 mg | Freq: Four times a day (QID) | INTRAMUSCULAR | Status: AC
  Administered 2020-02-12 – 2020-02-14 (×8): 40 mg via INTRAVENOUS
  Filled 2020-02-12 (×8): qty 4

## 2020-02-12 MED ORDER — ENOXAPARIN SODIUM 40 MG/0.4ML ~~LOC~~ SOLN
40.0000 mg | SUBCUTANEOUS | Status: DC
  Administered 2020-02-12 – 2020-02-27 (×16): 40 mg via SUBCUTANEOUS
  Filled 2020-02-12 (×16): qty 0.4

## 2020-02-12 MED ORDER — PROPOFOL 1000 MG/100ML IV EMUL
5.0000 ug/kg/min | INTRAVENOUS | Status: DC
  Administered 2020-02-12: 50 ug/kg/min via INTRAVENOUS
  Administered 2020-02-12: 5 ug/kg/min via INTRAVENOUS
  Administered 2020-02-12: 50 ug/kg/min via INTRAVENOUS
  Administered 2020-02-12: 55 ug/kg/min via INTRAVENOUS
  Administered 2020-02-13 (×2): 60 ug/kg/min via INTRAVENOUS
  Administered 2020-02-13: 70 ug/kg/min via INTRAVENOUS
  Administered 2020-02-13: 80 ug/kg/min via INTRAVENOUS
  Filled 2020-02-12 (×8): qty 100

## 2020-02-12 MED ORDER — POLYETHYLENE GLYCOL 3350 17 G PO PACK
17.0000 g | PACK | Freq: Every day | ORAL | Status: DC
  Administered 2020-02-18 – 2020-02-26 (×4): 17 g
  Filled 2020-02-12 (×7): qty 1

## 2020-02-12 MED ORDER — SODIUM CHLORIDE 0.9 % IV SOLN
2.0000 g | Freq: Three times a day (TID) | INTRAVENOUS | Status: AC
  Administered 2020-02-12 – 2020-02-19 (×20): 2 g via INTRAVENOUS
  Filled 2020-02-12 (×24): qty 2

## 2020-02-12 MED ORDER — PROPOFOL 1000 MG/100ML IV EMUL
INTRAVENOUS | Status: AC
  Filled 2020-02-12: qty 100

## 2020-02-12 NOTE — Telephone Encounter (Signed)
Hospital Consult for toe was debridded at [facility] and its a wound. They need direction.

## 2020-02-12 NOTE — Consult Note (Signed)
ORTHOPAEDIC CONSULTATION  REQUESTING PHYSICIAN: Candee Furbish, MD  Chief Complaint: Ulceration and necrosis of the right leg.  HPI: [name] [name] is a 64 y.o. female who presents with necrotizing fasciitis involving the right leg with a previous debridement of the right fourth toe.  Patient initially presented at [facility] on 01/26/2020 with complaints of pain and swelling in the right lower extremity.  She underwent debridement of the fourth toe ulcer.  Cultures were positive for group B strep.  With patient's deteriorating medical condition she was transferred to [facility] and is currently in the ICU on a ventilator with increased pulmonary secretions.  Patient has a history of COPD smoking, type 2 diabetes, severe protein caloric malnutrition.  Past Medical History:  Diagnosis Date  . Anxiety   . Bipolar 1 disorder ([place])   . Cancer ([place])    colon  . Chronic diarrhea    Followed by Dr Michail Sermon   . Complication of anesthesia    Hx resp distress during surgery  . COPD (chronic obstructive pulmonary disease) ([place])   . Depression   . Diabetes mellitus ([place])   . External hemorrhoids   . GI bleed   . Headache(784.0)   . Hyperlipidemia   . Hypertension   . Hypothyroidism   . Insomnia   . Internal hemorrhoids   . Polysubstance abuse ([place]) Quit 2008    H/O methamphetamine and alcohol abuse, clean x 6 years  . Shortness of breath   . Suicidal thoughts   . Tobacco abuse   . Tubular adenoma of colon    Past Surgical History:  Procedure Laterality Date  . ABDOMINAL HYSTERECTOMY    . APPENDECTOMY    . COLON SURGERY     "colon repair"  . COLONOSCOPY WITH PROPOFOL N/A 12/11/2012   Procedure: COLONOSCOPY WITH PROPOFOL;  Surgeon: Jerene Bears, MD;  Location: [facility] ENDOSCOPY;  Service: Gastroenterology;  Laterality: N/A;  . FINGER SURGERY     middle left finger  . HERNIA REPAIR     x 2  . KNEE SURGERY Left    Left knee tendon surgery  . TONSILLECTOMY    .  TUBAL LIGATION    . WOUND DEBRIDEMENT Right 02/06/2020   Procedure: SKIN BIOPSY WITH DEBRIDEMENT RIGHT LOWER LEG;  Surgeon: Virl Cagey, MD;  Location: [facility] ORS;  Service: General;  Laterality: Right;   Social History   Socioeconomic History  . Marital status: Divorced    Spouse name: Not on file  . Number of children: 2  . Years of education: Not on file  . Highest education level: Not on file  Occupational History  . Occupation: Disabled.  Tobacco Use  . Smoking status: Current Every Day Smoker    Packs/day: 1.00    Years: 42.00    Pack years: 42.00    Types: Cigarettes  . Smokeless tobacco: Never Used  Substance and Sexual Activity  . Alcohol use: Yes    Comment: occ  . Drug use: No  . Sexual activity: Not on file  Other Topics Concern  . Not on file  Social History Narrative   Divorced.  Lives with daughter in [place]. Patient moved from [place] state to [place] in August of 2013. Patient has remained sober from alcohol and amphetamine for about 6 years.   Social Determinants of Health   Financial Resource Strain:   . Difficulty of Paying Living Expenses: Not on file  Food Insecurity:   . Worried  About Running Out of Food in the Last Year: Not on file  . Ran Out of Food in the Last Year: Not on file  Transportation Needs:   . Lack of Transportation (Medical): Not on file  . Lack of Transportation (Non-Medical): Not on file  Physical Activity:   . Days of Exercise per Week: Not on file  . Minutes of Exercise per Session: Not on file  Stress:   . Feeling of Stress : Not on file  Social Connections:   . Frequency of Communication with Friends and Family: Not on file  . Frequency of Social Gatherings with Friends and Family: Not on file  . Attends Religious Services: Not on file  . Active Member of Clubs or Organizations: Not on file  . Attends Archivist Meetings: Not on file  . Marital Status: Not on file   Family History  Problem  Relation Age of Onset  . Multiple sclerosis Mother   . Alcoholism Father   . Depression Brother   . Emphysema Maternal Grandfather        smoked  . Breast cancer Neg Hx   . Colon cancer Neg Hx   . Esophageal cancer Neg Hx   . Colon polyps Neg Hx   . Stomach cancer Neg Hx   . Pancreatic cancer Neg Hx   . Liver disease Neg Hx    - negative except otherwise stated in the family history section Allergies  Allergen Reactions  . Bactrim [Sulfamethoxazole-Trimethoprim] Anaphylaxis  . Amoxicillin Hives  . Diclofenac Tinitus    swelling in feet  . Morphine And Related Nausea And Vomiting and Other (See Comments)    headache  . Potassium-Containing Compounds Itching   Prior to Admission medications   Medication Sig Start Date End Date Taking? Authorizing Provider  albuterol (PROVENTIL) (5 MG/ML) 0.5% nebulizer solution Take 2.5 mg by nebulization every 6 (six) hours as needed for wheezing or shortness of breath. 04/16/12  Yes Bonnielee Haff, MD  amitriptyline (ELAVIL) 100 MG tablet Take 100 mg by mouth at bedtime.    Yes [provider]  amLODipine (NORVASC) 5 MG tablet Take 5 mg by mouth daily.   Yes [provider]  benztropine (COGENTIN) 0.5 MG tablet Take 0.5 mg by mouth 2 (two) times daily. 12/11/19  Yes [provider]  busPIRone (BUSPAR) 10 MG tablet Take 10 mg by mouth 3 (three) times daily.    Yes [provider]  divalproex (DEPAKOTE) 500 MG DR tablet Take 500 mg by mouth 2 (two) times daily.    Yes [provider]  famotidine (PEPCID) 20 MG tablet Take 1 tablet (20 mg total) by mouth 2 (two) times daily. 01/10/20  Yes Milton Ferguson, MD  QUEtiapine (SEROQUEL) 50 MG tablet Take 50 mg by mouth 3 (three) times daily.    Yes [provider]  sertraline (ZOLOFT) 100 MG tablet Take 200 mg by mouth daily.    Yes [provider]  SPIRIVA HANDIHALER 18 MCG inhalation capsule Place 1 capsule into inhaler and inhale daily.   12/17/19  Yes [provider]  metFORMIN (GLUCOPHAGE) 850 MG tablet Take 1 tablet by mouth in the morning and at bedtime. Patient not taking: Reported on 01/26/2020 04/27/17   [provider]   No results found. - pertinent xrays, CT, MRI studies were reviewed and independently interpreted  Positive ROS: All other systems have been reviewed and were otherwise negative with the exception of those mentioned in the HPI  and as above.  Physical Exam: General: Alert, no acute distress Psychiatric: Patient is competent for consent with normal mood and affect Lymphatic: No axillary or cervical lymphadenopathy Cardiovascular: No pedal edema Respiratory: No cyanosis, no use of accessory musculature GI: No organomegaly, abdomen is soft and non-tender    Images:  @ENCIMAGES @  Labs:  Lab Results  Component Value Date   HGBA1C 5.8 (H) 01/26/2020   HGBA1C 5.6 04/12/2016   HGBA1C 6.8 (H) 08/29/2012   ESRSEDRATE 55 (H) 01/28/2020   ESRSEDRATE 52 (H) 01/27/2020   ESRSEDRATE 40 (H) 01/26/2020   CRP 68.5 (H) 01/28/2020   CRP 44 (H) 01/27/2020   CRP 43.8 (H) 01/26/2020   REPTSTATUS 02/12/2020 FINAL 02/10/2020   GRAMSTAIN  02/10/2020    RARE WBC PRESENT,BOTH PMN AND MONONUCLEAR RARE GRAM POSITIVE COCCI Performed at [facility], [place] [address], [place], [place] [address]    CULT  02/10/2020    ABUNDANT ACINETOBACTER CALCOACETICUS/BAUMANNII COMPLEX   LABORGA ACINETOBACTER CALCOACETICUS/BAUMANNII COMPLEX 02/10/2020    Lab Results  Component Value Date   ALBUMIN 2.4 (L) 02/11/2020   ALBUMIN 2.2 (L) 02/10/2020   ALBUMIN 2.1 (L) 02/09/2020    Neurologic: Patient does not have protective sensation bilateral lower extremities.   MUSCULOSKELETAL:   Skin: Examination patient has necrotic skin with black eschar involving three quarters of the circumference of the right leg.  There is no crepitation or ascending cellulitis in the thigh the calf is tender to palpation.   There is purulent drainage from both the medial and lateral aspect of the necrotic ulcer.  Patient has a strong dorsalis pedis pulse.  There is no cellulitis or swelling of the fourth toe.  Patient's blood cultures on initial presentation were positive for group B strep.  Patient's albumin has ranged from 2.1-2.4.  Hemoglobin A1c most recently 5.8.  Assessment: Assessment: Necrotizing fasciitis involving the right lower extremity with purulent drainage with no vascular insufficiency and no evidence of extension of the infection proximal to the knee.  Plan: Discussed with her primary care physician that ideal treatment options for this extensive necrotizing fasciitis to the right leg would be to proceed with an above-knee amputation.  Medicine wants to try to improve her medical status prior to proceeding with surgical intervention.  I will follow up on Monday.  Anticipate above-the-knee amputation to the right lower extremity next week.  Patient is not felt to be stable for surgical intervention today.  Thank you for the consult and the opportunity to see Ms. Hiram Gash, MD [place] [phone] 11:22 AM

## 2020-02-12 NOTE — Progress Notes (Signed)
NAME:  [name] [name], MRN:  [id number], DOB:  [birth year], LOS: 33 ADMISSION DATE:  01/26/2020, CONSULTATION DATE:  01/28/20 REFERRING MD:  Ortiz/ Triad, CHIEF COMPLAINT:  resp distress    Brief History   64 yo female smoker presented 9/05 with Rt foot pain and swelling.  This was associated with chills, myalgias from sepsis with cellulitis.  Developed A fib with RV and then hypoxia.  Required intubation 9/07 and PCCM consulted.  H&P   [name] [name] is a 64 y.o. 64 year old female with past medical history patient at home, hypertension, type 2 diabetes,, tobacco abuse, questionable, and chronic substance abuse who presented to the emergency department.  Symptoms began after hours to 2 weeks prior to and imaging.  Patient also reported fever, chills, body aches, and mild headache with general malaise prior to admission.  Patient attempted soaking foot with little to no improvement prior to admission.  She denied any chest pain, shortness of breath,.  Initially on arrival patient was treated for sepsis and required mechanical intubation.  PCCM was consulted morning of 9/07 originally.  Extubation was attempted 9/17 patient became stridorous progressed with strokes requiring ventilation.  Patient was transferred to [facility], 9/19 for further evaluation of possible tracheostomy placement and continued treatment for sepsis.  Past Medical History  COPD on home oxygen, HTN, DM, Bipolar  Significant Hospital Events   9/05 Admit 9/07 intubated 9/08 back in sinus rhythm 9/14 fever, Abx changed 9/16 debridement of Rt 2nd toe with evacuation of abscess, superficial debridement of Rt lung with punch biopsy x 2 9/17 Extubate, required reintubation due to stridor distress 9/19 transferred to [facility] for further evaluation and possible tracheostomy  Consults:  Cardiology Surgery Palliative care  Procedures:  ETT 9/07 >> 9/17 9/17 >  Lt PICC 9/12 >>   Significant Diagnostic Tests:  Echo 9/08 >>  EF 55 to 60% CT Rt leg 9/11 >> diffuse subcutaneous soft tissue swelling, moderate myofasciitis involving mid to lower calf, no osteomyelitis or septic arthritis CRX 9/17 > Right lower lobe collapse and right pleural effusion unchanged. Progressive left lower lobe atelectasis and effusion. Bronch 9/20 > clear secretions, no obstruction or collapse POCUS bedside chest [place] 9/20 > minimal fluid  Micro Data:  COVID 9/05 >> negative MRSA PCR 9/08 >> negative Blood 9/05 >> Group B Streptococcus Blood 9/07 >> negative BAL 9/20 >   Antimicrobials:  Vancomycin 9/05 Rocephin 9/05 Ancef 9/06 >> 9/10 Vancomycin 9/11 >> 9/15 Clindamycin 9/14 >>  Ancef 9/15 >>   Interim history/subjective:   Continues to diurese. On PS trial. Denies pain. Mildly anxious.  Objective   Blood pressure (!) 171/91, pulse 74, temperature (!) 97.4 F (36.3 C), temperature source Oral, resp. rate (!) 22, height _0  (1.651 m), weight 102.4 kg, SpO2 96 %.    Vent Mode: PSV;CPAP FiO2 (%):  [40 %] 40 % Set Rate:  [16 bmp] 16 bmp Vt Set:  [450 mL] 450 mL PEEP:  [5 cmH20] 5 cmH20 Pressure Support:  [10 cmH20] 10 cmH20 Plateau Pressure:  [20 cmH20-27 cmH20] 26 cmH20   Intake/Output Summary (Last 24 hours) at 02/12/2020 0842 Last data filed at 02/12/2020 0800 Gross per 24 hour  Intake 2169.26 ml  Output 5025 ml  Net -2855.74 ml   Filed Weights   02/07/20 0500 02/09/20 0500 02/11/20 0500  Weight: 105.9 kg 106.9 kg 102.4 kg    Examination: Constitutional: chronically ill woman in no acute distress Eyes: eyes are anicteric, reactive to light Ears, nose,  mouth, and throat: mucous membranes moist, trachea midline Cardiovascular: heart sounds are regular, ext are warm to touch. trace edema Respiratory: Diminished bases, R lung US showing elevated R hemidiaphragm and minimal fluid Gastrointestinal: abdomen is soft with + BS Skin: No rashes, normal turgor Neurologic: Moves all 4 ext to command Psychiatric:  mildly anxious  BMP pending Cr okay CBC benign CBG okay -1.5L    Assessment & Plan:   Acute on chronic hypoxic/hypercapnic respiratory failure - Secondary to acute pulmonary edema in setting of A fib with RVR, sepsis resuscitation, COPD exacerbation, and mucus plugging.   - Continue to push diuresis as renal function allows - Repeat therapeutic bronchoscopy today - Continue vent support, VAP prevention bundle  Group B Streptococcal bacteremia - Secondary to Rt lower leg cellulitis, ulcer, abscess s/p debridement on 9/16. 01/29/2020: Surface echo negative normal EF no veg. -Continue antimicrobials, continue Ancef. -Surgery was asked regarding wound care who deferred to wound care who deferred to orthopedics, I am awaiting call back from orhtopedics; apparently they were paged yesterday, will ask podiiatry to see as I think this is a pretty distal process that should fall within their purview  Transient A fib with RVR. - Has not recurred - Okay to stop heparin gtt as this was likely reactive - Can consider holter monitor as OP  Acute metabolic encephalopathy from sepsis, hypoxia. Hx of ETOH, depression, anxiety, seizures. - Resolved - Continue meds as ordered for sedaation while on vent  Goals of care. -Palliative care following -Patient's functional status at baseline she does not leave the home.  Sits and watches TV most of the day.  Oxygen dependent chronic respiratory failure and COPD.  History of dysphagia. -SLP eval once extubated  Deconditioning. -We will need PT OT at some point  Best practice:  Diet: TFs DVT prophylaxis: heparin ppx GI prophylaxis: protonix Mobility: bed rest Code Status: Partial (no CPR) Disposition: ICU Family updates: Daughter updated  This patient is critically ill with multiple organ system failure; which, requires frequent high complexity decision making, assessment, support, evaluation, and titration of therapies. This was completed  through the application of advanced monitoring technologies and extensive interpretation of multiple databases. During this encounter critical care time was devoted to patient care services described in this note for 35 minutes.  Candee Furbish, MD [facility] 02/12/2020 8:42 AM

## 2020-02-13 ENCOUNTER — Inpatient Hospital Stay (HOSPITAL_COMMUNITY): Payer: Medicaid Other

## 2020-02-13 LAB — CBC WITH DIFFERENTIAL/PLATELET
Abs Immature Granulocytes: 0.08 K/uL — ABNORMAL HIGH (ref 0.00–0.07)
Basophils Absolute: 0.1 K/uL (ref 0.0–0.1)
Basophils Relative: 1 %
Eosinophils Absolute: 0 K/uL (ref 0.0–0.5)
Eosinophils Relative: 1 %
HCT: 40.6 % (ref 36.0–46.0)
Hemoglobin: 12.7 g/dL (ref 12.0–15.0)
Immature Granulocytes: 1 %
Lymphocytes Relative: 12 %
Lymphs Abs: 0.8 K/uL (ref 0.7–4.0)
MCH: 28.1 pg (ref 26.0–34.0)
MCHC: 31.3 g/dL (ref 30.0–36.0)
MCV: 89.8 fL (ref 80.0–100.0)
Monocytes Absolute: 0.4 K/uL (ref 0.1–1.0)
Monocytes Relative: 7 %
Neutro Abs: 4.8 K/uL (ref 1.7–7.7)
Neutrophils Relative %: 78 %
Platelets: 230 K/uL (ref 150–400)
RBC: 4.52 MIL/uL (ref 3.87–5.11)
RDW: 14.6 % (ref 11.5–15.5)
WBC: 6.1 K/uL (ref 4.0–10.5)
nRBC: 0 % (ref 0.0–0.2)

## 2020-02-13 LAB — MAGNESIUM: Magnesium: 1.7 mg/dL (ref 1.7–2.4)

## 2020-02-13 LAB — BASIC METABOLIC PANEL
Anion gap: 12 (ref 5–15)
BUN: 29 mg/dL — ABNORMAL HIGH (ref 8–23)
CO2: 41 mmol/L — ABNORMAL HIGH (ref 22–32)
Calcium: 9.9 mg/dL (ref 8.9–10.3)
Chloride: 80 mmol/L — ABNORMAL LOW (ref 98–111)
Creatinine, Ser: 0.48 mg/dL (ref 0.44–1.00)
GFR calc Af Amer: >60 mL/min (ref >60)
GFR calc non Af Amer: >60 mL/min (ref >60)
Glucose, Bld: 161 mg/dL — ABNORMAL HIGH (ref 70–99)
Potassium: 4.3 mmol/L (ref 3.5–5.1)
Sodium: 133 mmol/L — ABNORMAL LOW (ref 135–145)

## 2020-02-13 LAB — GLUCOSE, CAPILLARY
Glucose-Capillary: 131 mg/dL — ABNORMAL HIGH (ref 70–99)
Glucose-Capillary: 135 mg/dL — ABNORMAL HIGH (ref 70–99)
Glucose-Capillary: 141 mg/dL — ABNORMAL HIGH (ref 70–99)
Glucose-Capillary: 150 mg/dL — ABNORMAL HIGH (ref 70–99)
Glucose-Capillary: 165 mg/dL — ABNORMAL HIGH (ref 70–99)
Glucose-Capillary: 184 mg/dL — ABNORMAL HIGH (ref 70–99)

## 2020-02-13 MED ORDER — LORAZEPAM 2 MG/ML IJ SOLN
1.0000 mg | INTRAMUSCULAR | Status: DC | PRN
  Administered 2020-02-13 – 2020-02-19 (×9): 2 mg via INTRAVENOUS
  Administered 2020-02-20: 1 mg via INTRAVENOUS
  Filled 2020-02-13 (×11): qty 1

## 2020-02-13 MED ORDER — GERHARDT'S BUTT CREAM
TOPICAL_CREAM | Freq: Two times a day (BID) | CUTANEOUS | Status: DC
  Administered 2020-02-22: 1 via TOPICAL
  Filled 2020-02-13 (×2): qty 1

## 2020-02-13 MED ORDER — METOLAZONE 5 MG PO TABS
5.0000 mg | ORAL_TABLET | Freq: Once | ORAL | Status: DC
  Filled 2020-02-13: qty 1

## 2020-02-13 MED ORDER — ORAL CARE MOUTH RINSE
15.0000 mL | Freq: Two times a day (BID) | OROMUCOSAL | Status: DC
  Administered 2020-02-14 – 2020-02-27 (×26): 15 mL via OROMUCOSAL

## 2020-02-13 MED ORDER — POTASSIUM CHLORIDE 20 MEQ/15ML (10%) PO SOLN
40.0000 meq | Freq: Once | ORAL | Status: DC
  Filled 2020-02-13: qty 30

## 2020-02-13 NOTE — Telephone Encounter (Signed)
Did you following up on this patient or speak with Dr. Tamala Julian?

## 2020-02-13 NOTE — Progress Notes (Signed)
NAME:  [name] [name], MRN:  [id number], DOB:  1955-06-24, LOS: 35 ADMISSION DATE:  01/26/2020, CONSULTATION DATE:  01/28/20 REFERRING MD:  Ortiz/ Triad, CHIEF COMPLAINT:  resp distress    Brief History   64 yo female smoker presented 9/05 with Rt foot pain and swelling.  This was associated with chills, myalgias from sepsis with cellulitis.  Developed A fib with RV and then hypoxia.  Required intubation 9/07 and PCCM consulted.  H&P   [name] [name] is a 64 y.o. 64 year old female with past medical history patient at home, hypertension, type 2 diabetes,, tobacco abuse, questionable, and chronic substance abuse who presented to the emergency department.  Symptoms began after hours to 2 weeks prior to and imaging.  Patient also reported fever, chills, body aches, and mild headache with general malaise prior to admission.  Patient attempted soaking foot with little to no improvement prior to admission.  She denied any chest pain, shortness of breath,.  Initially on arrival patient was treated for sepsis and required mechanical intubation.  PCCM was consulted morning of 9/07 originally.  Extubation was attempted 9/17 patient became stridorous progressed with strokes requiring ventilation.  Patient was transferred to [facility], 9/19 for further evaluation of possible tracheostomy placement and continued treatment for sepsis.  Past Medical History  COPD on home oxygen, HTN, DM, Bipolar  Significant Hospital Events   9/05 Admit 9/07 intubated 9/08 back in sinus rhythm 9/14 fever, Abx changed 9/16 debridement of Rt 2nd toe with evacuation of abscess, superficial debridement of Rt lung with punch biopsy x 2 9/17 Extubate, required reintubation due to stridor distress 9/19 transferred to [facility] for further evaluation and possible tracheostomy  Consults:  Cardiology Surgery Palliative care  Procedures:  ETT 9/07 >> 9/17 9/17 >  Lt PICC 9/12 >>   Significant Diagnostic Tests:  Echo 9/08 >>  EF 55 to 60% CT Rt leg 9/11 >> diffuse subcutaneous soft tissue swelling, moderate myofasciitis involving mid to lower calf, no osteomyelitis or septic arthritis CRX 9/17 > Right lower lobe collapse and right pleural effusion unchanged. Progressive left lower lobe atelectasis and effusion. Bronch 9/20 > clear secretions, no obstruction or collapse POCUS bedside chest [place] 9/20 > minimal fluid  Micro Data:  COVID 9/05 >> negative MRSA PCR 9/08 >> negative Blood 9/05 >> Group B Streptococcus Blood 9/07 >> negative BAL 9/20 >   Antimicrobials:  Vancomycin 9/05 Rocephin 9/05 Ancef 9/06 >> 9/10 Vancomycin 9/11 >> 9/15 Clindamycin 9/14 >>  Ancef 9/15 >>   Interim history/subjective:   Diuresed well.   Objective   Blood pressure 120/83, pulse 72, temperature 98.3 F (36.8 C), temperature source Oral, resp. rate 19, height _0  (1.651 m), weight 91.4 kg, SpO2 99 %.    Vent Mode: PRVC FiO2 (%):  [40 %] 40 % Set Rate:  [16 bmp] 16 bmp Vt Set:  [450 mL] 450 mL PEEP:  [5 cmH20] 5 cmH20 Pressure Support:  [10 cmH20] 10 cmH20 Plateau Pressure:  [19 cmH20-25 cmH20] 25 cmH20   Intake/Output Summary (Last 24 hours) at 02/13/2020 0755 Last data filed at 02/13/2020 0600 Gross per 24 hour  Intake 2322.35 ml  Output 5650 ml  Net -3327.65 ml   Filed Weights   02/09/20 0500 02/11/20 0500 02/13/20 0500  Weight: 106.9 kg 102.4 kg 91.4 kg    Examination: Constitutional: chronically ill woman in no acute distress Eyes: eyes are anicteric, reactive to light Ears, nose, mouth, and throat: mucous membranes moist, trachea midline Cardiovascular: heart  sounds are regular, ext are warm to touch. trace edema Respiratory: Diminished bases, R lung US showing elevated R hemidiaphragm and minimal fluid Gastrointestinal: abdomen is soft with + BS Skin: No rashes, normal turgor Neurologic: Moves all 4 ext to command Psychiatric: mildly anxious  BMP pending Cr okay CBC benign CBG okay -3.3 L     Assessment & Plan:   Acute on chronic hypoxic/hypercapnic respiratory failure - Secondary to acute pulmonary edema in setting of A fib with RVR, sepsis resuscitation, COPD exacerbation, and mucus plugging.   - Continue to push diuresis as renal function allows - Recheck CXR, did not get chance to do bronch yesterday due to time issues. May consider again today. - Continue vent support, VAP prevention bundle - I think she may be able to avoid a trach  Group B Streptococcal bacteremia - Secondary to Rt lower leg cellulitis, ulcer, abscess s/p debridement on 9/16. 01/29/2020: Surface echo negative normal EF no veg. -Continue antimicrobials, broadened to meropenem. - Appreciate Dr. Jess Barters input, will potentially need AKA, will try to optimize her status to undergo this next week when he returns from conference, do not see any emergent need  Acinetobacter in BAL- unclear if colonized or true pathogen, meropenem started  Transient A fib with RVR. - Has not recurred - Okay to stop heparin gtt as this was likely reactive - Can consider holter monitor as OP  Acute metabolic encephalopathy from sepsis, hypoxia. Hx of ETOH, depression, anxiety, seizures. - Resolved - Continue meds as ordered for sedation while on vent  Goals of care. -Palliative care following -Patient's functional status at baseline she does not leave the home.  Sits and watches TV most of the day.  Oxygen dependent chronic respiratory failure and COPD.  History of dysphagia. -SLP eval once extubated  Deconditioning. -We will need PT OT at some point  Best practice:  Diet: TFs DVT prophylaxis: heparin ppx GI prophylaxis: protonix Mobility: bed rest Code Status: Partial (no CPR) Disposition: ICU Family updates: Daughter updated 9/22  This patient is critically ill with multiple organ system failure; which, requires frequent high complexity decision making, assessment, support, evaluation, and titration of  therapies. This was completed through the application of advanced monitoring technologies and extensive interpretation of multiple databases. During this encounter critical care time was devoted to patient care services described in this note for 37 minutes.  Candee Furbish, MD [facility] 02/13/2020 7:55 AM

## 2020-02-13 NOTE — Procedures (Signed)
Bronchoscopy Procedure Note  [name] [name]  [id number]  17-Oct-1955  Date:02/13/20  Time:11:30 AM   Provider Performing:Shea Swalley C Tamala Julian   Procedure(s):  Subsequent Therapeutic Aspiration of Tracheobronchial Tree [phone])  Indication(s) Mucus plugging of bronchi  Consent Risks of the procedure as well as the alternatives and risks of each were explained to the patient and/or caregiver.  Consent for the procedure was obtained and is signed in the bedside chart  Anesthesia Propofol and fentanyl in place for mechanical ventilation   Time Out Verified patient identification, verified procedure, site/side was marked, verified correct patient position, special equipment/implants available, medications/allergies/relevant history reviewed, required imaging and test results available.   Sterile Technique Usual hand hygiene, masks, gowns, and gloves were used   Procedure Description Bronchoscope advanced through endotracheal tube and into airway.  Airways were examined down to subsegmental level with findings noted below.   Following diagnostic evaluation, Therapeutic aspiration performed in RLL, RML, RUL with return of thick mucus plugs, remainder of right lung down to subsegmental level were open but inflammed  Findings:  - Mucus plugging of segmental bronchi on right successfully suctioned   Complications/Tolerance None; patient tolerated the procedure well. Chest X-ray is not needed post procedure.   EBL Minimal   Specimen(s) None

## 2020-02-13 NOTE — Progress Notes (Signed)
eLink Physician-Brief Progress Note Patient Name: [name] [name] DOB: 02/22/1956 MRN: [id number]   Date of Service  02/13/2020  HPI/Events of Note  Patient had difficulty with swallowing tonight.  eICU Interventions  NPO tonight, MD to re-evaluate in a.m.        Frederik Pear 02/13/2020, 9:27 PM

## 2020-02-13 NOTE — Progress Notes (Signed)
Discussed case with daughter about trial of extubation and what to do if she fails again: trach/PEG/AKA vs. Let patient pass in peace.  She does not want her mother to be in [facility] on trach/PEG so we will extubate and if she takes a turn we will focus on comfort.  Should patient do well with extubation we will revisit surgical options for her leg.  Erskine Emery MD PCCM

## 2020-02-13 NOTE — Procedures (Signed)
Extubation Procedure Note  Patient Details:   Name: [name] [name] DOB: 04/20/1956 MRN: [id number]   Airway Documentation:    Vent end date: 02/13/20 Vent end time: 1110   Evaluation  O2 sats: stable throughout Complications: No apparent complications Patient did tolerate procedure well. BBS clear and diminished, no stridor noted.    No   Patient extubated per order and family/patient wishes to 4L [place] with no apparent complications. Positive cuff leak was noted prior to extubation. Patient's family and RN are at bedside. Vitals are stable. RT will continue to monitor.   Aivan Fillingim Clyda Greener 02/13/2020, 11:15 AM

## 2020-02-13 NOTE — Progress Notes (Signed)
Patient ID: [name] [name], female   DOB: 1955/08/29, 64 y.o.   MRN: [id number]  This NP visited patient at the bedside as a follow up for palliative medicine needs and emotional support.  After discussion at bedside including  myself, the  daughter/[name] and Dr Smith/CCM decision is made to maximize medically and to one way extubate.  Hope is that patient will remain medically stable without vent support, and show signs of improvement.  Education offered to daughter regarding the patient's high risk for decompensation, she understands.  The difference between an aggressive medcial intervetnion path and a comfort path was detail.   I also spoke by phone with daughter in [place] state/ [name].   Grandmother/[name] is in the room.   Both verbalize support for a comfort path.  They both speak to knowing that patient would never want to live on machines, in a nursing home  or lose her leg.  [name] tells me "I don't think she is going to make it"  Education regarding the natural trajectory and expectations at EOL offered.  Education offered on utilization of mediations to enhance comfort.   Emotional support offered  Plan of Care:            Comfort is the priority.  Family understand the seriousness but daughter is hoping for improvement.   -DNR/DNI-- no BiPap -comfort is the main focus of care "I don't want my mother to suffer" -no artifical feeding now or in the future -continue IV antibiotics -oral medication as tolerated  - no consideration for  amputation  -symptom management for comfort measures, see orders per CCM  Questions and concerns addressed   Discussed with Dr Tamala Julian and bedside RN   Total time spent on the unit was 60 minutes    PMT will continue to support holisically  Greater than 50% of the time was spent in counseling and coordination of care  [name] NP  Palliative Medicine Team Team Phone # [phone] Pager [phone]

## 2020-02-13 NOTE — Telephone Encounter (Signed)
I'm just seeing this now. I was in the OR all day. Let me look at it

## 2020-02-14 LAB — CBC WITH DIFFERENTIAL/PLATELET
Abs Immature Granulocytes: 0.09 10*3/uL — ABNORMAL HIGH (ref 0.00–0.07)
Basophils Absolute: 0.1 10*3/uL (ref 0.0–0.1)
Basophils Relative: 1 %
Eosinophils Absolute: 0 10*3/uL (ref 0.0–0.5)
Eosinophils Relative: 0 %
HCT: 43.6 % (ref 36.0–46.0)
Hemoglobin: 14 g/dL (ref 12.0–15.0)
Immature Granulocytes: 1 %
Lymphocytes Relative: 8 %
Lymphs Abs: 0.6 10*3/uL — ABNORMAL LOW (ref 0.7–4.0)
MCH: 28.2 pg (ref 26.0–34.0)
MCHC: 32.1 g/dL (ref 30.0–36.0)
MCV: 87.7 fL (ref 80.0–100.0)
Monocytes Absolute: 0.4 10*3/uL (ref 0.1–1.0)
Monocytes Relative: 5 %
Neutro Abs: 6.1 10*3/uL (ref 1.7–7.7)
Neutrophils Relative %: 85 %
Platelets: 230 10*3/uL (ref 150–400)
RBC: 4.97 MIL/uL (ref 3.87–5.11)
RDW: 14.5 % (ref 11.5–15.5)
WBC: 7.2 10*3/uL (ref 4.0–10.5)
nRBC: 0 % (ref 0.0–0.2)

## 2020-02-14 LAB — BASIC METABOLIC PANEL
Anion gap: 12 (ref 5–15)
BUN: 27 mg/dL — ABNORMAL HIGH (ref 8–23)
CO2: 40 mmol/L — ABNORMAL HIGH (ref 22–32)
Calcium: 9.8 mg/dL (ref 8.9–10.3)
Chloride: 82 mmol/L — ABNORMAL LOW (ref 98–111)
Creatinine, Ser: 0.48 mg/dL (ref 0.44–1.00)
GFR calc Af Amer: >60 mL/min (ref >60)
GFR calc non Af Amer: >60 mL/min (ref >60)
Glucose, Bld: 189 mg/dL — ABNORMAL HIGH (ref 70–99)
Potassium: 3.4 mmol/L — ABNORMAL LOW (ref 3.5–5.1)
Sodium: 134 mmol/L — ABNORMAL LOW (ref 135–145)

## 2020-02-14 LAB — GLUCOSE, CAPILLARY
Glucose-Capillary: 183 mg/dL — ABNORMAL HIGH (ref 70–99)
Glucose-Capillary: 160 mg/dL — ABNORMAL HIGH (ref 70–99)
Glucose-Capillary: 132 mg/dL — ABNORMAL HIGH (ref 70–99)
Glucose-Capillary: 164 mg/dL — ABNORMAL HIGH (ref 70–99)
Glucose-Capillary: 128 mg/dL — ABNORMAL HIGH (ref 70–99)

## 2020-02-14 LAB — MAGNESIUM: Magnesium: 1.7 mg/dL (ref 1.7–2.4)

## 2020-02-14 MED ORDER — MAGNESIUM SULFATE 2 GM/50ML IV SOLN
2.0000 g | Freq: Once | INTRAVENOUS | Status: AC
  Administered 2020-02-14: 2 g via INTRAVENOUS
  Filled 2020-02-14: qty 50

## 2020-02-14 MED ORDER — POTASSIUM CHLORIDE 10 MEQ/100ML IV SOLN
10.0000 meq | INTRAVENOUS | Status: AC
  Administered 2020-02-14 (×4): 10 meq via INTRAVENOUS
  Filled 2020-02-14 (×8): qty 100

## 2020-02-14 MED ORDER — DEXMEDETOMIDINE HCL IN NACL 400 MCG/100ML IV SOLN
0.4000 ug/kg/h | INTRAVENOUS | Status: DC
  Administered 2020-02-14 (×2): 0.4 ug/kg/h via INTRAVENOUS
  Administered 2020-02-15: 0.6 ug/kg/h via INTRAVENOUS
  Administered 2020-02-15: 0.4 ug/kg/h via INTRAVENOUS
  Administered 2020-02-16 (×2): 0.6 ug/kg/h via INTRAVENOUS
  Administered 2020-02-17: 0.4 ug/kg/h via INTRAVENOUS
  Administered 2020-02-17: 0.2 ug/kg/h via INTRAVENOUS
  Filled 2020-02-14: qty 300
  Filled 2020-02-14 (×7): qty 100

## 2020-02-14 MED ORDER — FUROSEMIDE 10 MG/ML IJ SOLN
40.0000 mg | Freq: Four times a day (QID) | INTRAMUSCULAR | Status: AC
  Administered 2020-02-14 – 2020-02-16 (×7): 40 mg via INTRAVENOUS
  Filled 2020-02-14 (×7): qty 4

## 2020-02-14 NOTE — Progress Notes (Signed)
Palliative:  Received update by RN [name]. Ms. [name] [name] appears to be resting comfortably without BiPAP after episode of distress this morning. No family at bedside. Will continue to shadow.   No charge  Vinie Sill, NP Palliative Medicine Team Pager [phone] (Please see [email or web address] for schedule) Team Phone [phone]

## 2020-02-14 NOTE — Progress Notes (Signed)
Chaplain stopped by patient's room to check-in. The patient was sleep and the chaplain spoke with nurse [name]) to get an update. Nurse is concerned about daughter and realizes she anxious about decision making concerning moms care. The nurses are very supportive and recommended I call and speak to her. The daughter and I spent more than 3 hours together yesterday so we have build a rapport. Chaplain called daughter [name]) in order to check-in with her and provide spiritual, social, and emotional care. Chaplain left a message because the daughter was unavailable.

## 2020-02-14 NOTE — Progress Notes (Signed)
NAME:  [name] [name], MRN:  [id number], DOB:  1955/06/09, LOS: 40 ADMISSION DATE:  01/26/2020, CONSULTATION DATE:  01/28/20 REFERRING MD:  Ortiz/ Triad, CHIEF COMPLAINT:  resp distress    Brief History   64 yo female smoker presented 9/05 with Rt foot pain and swelling.  This was associated with chills, myalgias from sepsis with cellulitis.  Developed A fib with RV and then hypoxia.  Required intubation 9/07 and PCCM consulted.  H&P   [name] [name] is a 64 y.o. 64 year old female with past medical history patient at home, hypertension, type 2 diabetes,, tobacco abuse, questionable, and chronic substance abuse who presented to the emergency department.  Symptoms began after hours to 2 weeks prior to and imaging.  Patient also reported fever, chills, body aches, and mild headache with general malaise prior to admission.  Patient attempted soaking foot with little to no improvement prior to admission.  She denied any chest pain, shortness of breath,.  Initially on arrival patient was treated for sepsis and required mechanical intubation.  PCCM was consulted morning of 9/07 originally.  Extubation was attempted 9/17 patient became stridorous progressed with strokes requiring ventilation.  Patient was transferred to [facility], 9/19 for further evaluation of possible tracheostomy placement and continued treatment for sepsis.  Past Medical History  COPD on home oxygen, HTN, DM, Bipolar  Significant Hospital Events   9/05 Admit 9/07 intubated 9/08 back in sinus rhythm 9/14 fever, Abx changed 9/16 debridement of Rt 2nd toe with evacuation of abscess, superficial debridement of Rt lung with punch biopsy x 2 9/17 Extubate, required reintubation due to stridor distress 9/19 transferred to [facility] for further evaluation and possible tracheostomy  Consults:  Cardiology Surgery Palliative care  Procedures:  ETT 9/07 >> 9/17 9/17 >  Lt PICC 9/12 >>   Significant Diagnostic Tests:  Echo 9/08 >>  EF 55 to 60% CT Rt leg 9/11 >> diffuse subcutaneous soft tissue swelling, moderate myofasciitis involving mid to lower calf, no osteomyelitis or septic arthritis CRX 9/17 > Right lower lobe collapse and right pleural effusion unchanged. Progressive left lower lobe atelectasis and effusion. Bronch 9/20 > clear secretions, no obstruction or collapse POCUS bedside chest [place] 9/20 > minimal fluid  Micro Data:  COVID 9/05 >> negative MRSA PCR 9/08 >> negative Blood 9/05 >> Group B Streptococcus Blood 9/07 >> negative BAL 9/20 >   Antimicrobials:  Vancomycin 9/05 Rocephin 9/05 Ancef 9/06 >> 9/10 Vancomycin 9/11 >> 9/15 Clindamycin 9/14 >>  Ancef 9/15 >>   Interim history/subjective:  1 way extubation yesterday Lots of anxiety Shallow rapid inspiratory efforts with her panic attacks  Objective   Blood pressure (!) 151/88, pulse 71, temperature (!) 96.9 F (36.1 C), temperature source Axillary, resp. rate 17, height _0  (1.651 m), weight 89 kg, SpO2 97 %.    FiO2 (%):  [36 %] 36 %   Intake/Output Summary (Last 24 hours) at 02/14/2020 0923 Last data filed at 02/14/2020 0700 Gross per 24 hour  Intake 861.22 ml  Output 3675 ml  Net -2813.78 ml   Filed Weights   02/11/20 0500 02/13/20 0500 02/14/20 0500  Weight: 102.4 kg 91.4 kg 89 kg    Examination: Constitutional: chronically ill woman in no acute distress Eyes: eyes are anicteric, reactive to light Ears, nose, mouth, and throat: mucous membranes moist, trachea midline Cardiovascular: heart sounds are regular, ext are warm to touch. trace edema Respiratory: wheezing bilaterally with poor air movement, +accessory muscle use Gastrointestinal: abdomen is soft with +  BS Skin: No rashes, normal turgor Neurologic: Moves all 4 ext to command Psychiatric: anxious  Renal function stable CBC benign K/mg low and repleting  Assessment & Plan:   Acute on chronic hypoxic/hypercapnic respiratory failure - Secondary to acute  pulmonary edema in setting of A fib with RVR, sepsis resuscitation, COPD exacerbation, and mucus plugging.  S/p multiple therapeutic bronchoscopies. Acinetobacter in culture. - Continue to push diuresis as renal function allows - Meropenem x 7 days - See next issue  Underlying anxiety, air hunger, weakness, secretion clearance problems- family does not want to pursue trach/PEG LTACH so we are giving her trial off vent and if she languishes she will be transitioned to full comfort care - Opiates PRN air hunger - Start precedex - BIPAP can be tried for comfort - Nebs as ordered  Group B Streptococcal bacteremia - Secondary to Rt lower leg cellulitis, ulcer, abscess s/p debridement on 9/16. 01/29/2020: Surface echo negative normal EF no veg. -Continue antimicrobials, broadened to meropenem. - Appreciate Dr. Jess Barters input, will potentially need AKA if she stabilizes from a respiratory perspective  Transient A fib with RVR. - Has not recurred - Okay to stop heparin gtt as this was likely reactive - Can consider holter monitor as OP  Goals of care. -Palliative care following -Patient's functional status at baseline she does not leave the home.  Sits and watches TV most of the day, noncompliant with home O2.  Severe COPD.  History of dysphagia. -SLP eval once extubated  Deconditioning. -We will need PT OT at some point  Best practice:  Diet: TFs DVT prophylaxis: heparin ppx GI prophylaxis: protonix Mobility: bed rest Code Status: DNR Disposition: ICU Family updates: Daughter updated 9/24  This patient is critically ill with multiple organ system failure; which, requires frequent high complexity decision making, assessment, support, evaluation, and titration of therapies. This was completed through the application of advanced monitoring technologies and extensive interpretation of multiple databases. During this encounter critical care time was devoted to patient care services described  in this note for 38 minutes.  Candee Furbish, MD [facility] 02/14/2020 9:23 AM

## 2020-02-14 NOTE — Progress Notes (Addendum)
Pt experiencing resp distress upon shift change this AM, retractions/accessory muscle use, nasal flaring, orthopneic.  RR-38, SaO2 78% on 4L [place], and diaphoretic.  Paged RT and MD to bedside,   Patient improving: Nebs, Lasix, [place] to 8L, Steroids, Ativan IVP PRN, Precedex gtt started, Fentanyl gtt continued.    MD reviewed CPAP/Bipap with [name], daughter, if needed for distress/SOB again.  Device at bedside.   P/S:  [name], daughter, at bedside this AM voiced concerns over care from prior day: stated "mom's IV came out and no one knew, then (after the RN dressed the site) they put the glove right back on".  Referring to a hand PIV the patient pulled out and the protective hand mitts placed back on after the nurse cleansed and dressed the IV site.  Reviewed concerns w/daughter, Dr Tamala Julian, and RT at bedside (notifying unit manager for care compliance).  Daughter stated no additional concerns and all concerns met today, all questions answered, and she is "happy with care now".  Daughter declined staying w/mother today though in respiratory distress this AM, "needed to go get psychiatric treatment for myself" she stated.   -Called to provide additional update an hour after daughter left to let her know patient was stable and resting comfortably.   -Noon: Daughter visited after "her appointment" for short time. Progress report provided, offered bedside chir and availability to stay. Daughter declined stating she had wanted to "go on a date".

## 2020-02-14 NOTE — Progress Notes (Signed)
Nutrition Brief Note  Patient has been extubated. 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 Per MD notes, if respiratory status declines, plan to transition to comfort care.   Patient current NPO. If surgical intervention warranted recommend insertion of Cortrak tube with EN given increased nutritional needs.    If nutrition issues arise, please consult RD.   Mariana Single RD, LDN Clinical Nutrition Pager listed in [place]

## 2020-02-15 LAB — CBC WITH DIFFERENTIAL/PLATELET
Abs Immature Granulocytes: 0.05 10*3/uL (ref 0.00–0.07)
Basophils Absolute: 0.1 10*3/uL (ref 0.0–0.1)
Basophils Relative: 1 %
Eosinophils Absolute: 0 10*3/uL (ref 0.0–0.5)
Eosinophils Relative: 1 %
HCT: 44.4 % (ref 36.0–46.0)
Hemoglobin: 13.7 g/dL (ref 12.0–15.0)
Immature Granulocytes: 1 %
Lymphocytes Relative: 25 %
Lymphs Abs: 1.3 10*3/uL (ref 0.7–4.0)
MCH: 27.3 pg (ref 26.0–34.0)
MCHC: 30.9 g/dL (ref 30.0–36.0)
MCV: 88.4 fL (ref 80.0–100.0)
Monocytes Absolute: 0.3 10*3/uL (ref 0.1–1.0)
Monocytes Relative: 7 %
Neutro Abs: 3.5 10*3/uL (ref 1.7–7.7)
Neutrophils Relative %: 65 %
Platelets: 210 10*3/uL (ref 150–400)
RBC: 5.02 MIL/uL (ref 3.87–5.11)
RDW: 14.2 % (ref 11.5–15.5)
WBC: 5.3 10*3/uL (ref 4.0–10.5)
nRBC: 0 % (ref 0.0–0.2)

## 2020-02-15 LAB — BASIC METABOLIC PANEL
Anion gap: 14 (ref 5–15)
BUN: 39 mg/dL — ABNORMAL HIGH (ref 8–23)
CO2: 35 mmol/L — ABNORMAL HIGH (ref 22–32)
Calcium: 9.5 mg/dL (ref 8.9–10.3)
Chloride: 84 mmol/L — ABNORMAL LOW (ref 98–111)
Creatinine, Ser: 0.42 mg/dL — ABNORMAL LOW (ref 0.44–1.00)
GFR calc Af Amer: >60 mL/min (ref >60)
GFR calc non Af Amer: >60 mL/min (ref >60)
Glucose, Bld: 155 mg/dL — ABNORMAL HIGH (ref 70–99)
Potassium: 3.8 mmol/L (ref 3.5–5.1)
Sodium: 133 mmol/L — ABNORMAL LOW (ref 135–145)

## 2020-02-15 LAB — MAGNESIUM: Magnesium: 2 mg/dL (ref 1.7–2.4)

## 2020-02-15 LAB — GLUCOSE, CAPILLARY
Glucose-Capillary: 124 mg/dL — ABNORMAL HIGH (ref 70–99)
Glucose-Capillary: 140 mg/dL — ABNORMAL HIGH (ref 70–99)
Glucose-Capillary: 149 mg/dL — ABNORMAL HIGH (ref 70–99)
Glucose-Capillary: 150 mg/dL — ABNORMAL HIGH (ref 70–99)
Glucose-Capillary: 164 mg/dL — ABNORMAL HIGH (ref 70–99)
Glucose-Capillary: 174 mg/dL — ABNORMAL HIGH (ref 70–99)
Glucose-Capillary: 181 mg/dL — ABNORMAL HIGH (ref 70–99)

## 2020-02-15 LAB — TRIGLYCERIDES: Triglycerides: 165 mg/dL — ABNORMAL HIGH (ref <150)

## 2020-02-15 MED ORDER — QUETIAPINE FUMARATE 25 MG PO TABS
25.0000 mg | ORAL_TABLET | Freq: Three times a day (TID) | ORAL | Status: DC
  Administered 2020-02-17 – 2020-02-20 (×9): 25 mg
  Filled 2020-02-15 (×10): qty 1

## 2020-02-15 MED ORDER — METOLAZONE 5 MG PO TABS: 5.0000 mg | ORAL_TABLET | Freq: Once | ORAL | Status: DC

## 2020-02-15 MED ORDER — LORAZEPAM 2 MG/ML IJ SOLN
0.5000 mg/h | INTRAVENOUS | Status: DC
  Administered 2020-02-16: 0.8 mg/h via INTRAVENOUS
  Filled 2020-02-15: qty 50

## 2020-02-15 MED ORDER — LEVETIRACETAM IN NACL 500 MG/100ML IV SOLN
500.0000 mg | Freq: Two times a day (BID) | INTRAVENOUS | Status: DC
  Administered 2020-02-15 – 2020-02-19 (×8): 500 mg via INTRAVENOUS
  Filled 2020-02-15 (×8): qty 100

## 2020-02-15 MED ORDER — QUETIAPINE FUMARATE 25 MG PO TABS: 25.0000 mg | ORAL_TABLET | Freq: Three times a day (TID) | ORAL | Status: DC

## 2020-02-15 MED ORDER — LORAZEPAM 2 MG/ML IJ SOLN
0.5000 mg/h | INTRAVENOUS | Status: DC
  Administered 2020-02-15: 0.5 mg/h via INTRAVENOUS
  Filled 2020-02-15: qty 25

## 2020-02-15 MED ORDER — DEXAMETHASONE SODIUM PHOSPHATE 4 MG/ML IJ SOLN
4.0000 mg | INTRAMUSCULAR | Status: DC
  Administered 2020-02-16 – 2020-02-20 (×5): 4 mg via INTRAVENOUS
  Filled 2020-02-15 (×5): qty 1

## 2020-02-15 NOTE — Progress Notes (Signed)
Palliative:  HPI: 64 y.o. female  with past medical history of COPD on HS oxygen, HTN, DM type 2, bipolar, obesity, ETOH/polysubstance abuse history and current smoker admitted on 01/26/2020 with worsening toe wound. This gradually developed after her daughter trimmed nail too close to skin approximately 2 weeks ago. Hospital admission for sepsis, Group A Streptococcus cellulitis/bacteremia, acute on chronic respiratory failure requiring intubation/mechaical ventilation 9/7. Surgery following for right 2nd toe infection and necrotic areas of right leg s/p OR debridement and skin biopsy 9/16. Surgery was questioning need for amputation. Receiving aggressive IV antibiotics. Extubation 02/13/20 with goals for no reintubation, tracheostomy, PEG. Palliative medicine consultation for goals of care.   I spoke today with daughter, [name] [name], privately outside her mother's room. She shares with me that her mother is doing better and that she wants her mother to have feeding tube placed for nutrition. She is concerned that everything is not being done to give her mother a chance for improvement. She steps away from unit.   I met today at [facility] bedside. She is very short of breath and struggles to focus and answer my questions. I asked [name] [name] about desire for feeding tube and she does not give a direct answer for her desires. I asked her if she felt hunger in her belly vs just thirsty as the water feels good to her throat and she tells me "thirsty." She is using accessory muscles and struggling to speak during my interactive - very short of breath. Per RN at bedside this has been her baseline for past 2 days.   I spoke further with daughter, [name] [name]. I explained my interaction with her mother and concern for her overall breathing status and explained use of breathing muscles. We discussed disease trajectory of COPD and that even without leg infection her prognosis is poor given her lung disease. We discussed intubation  and how sick lungs get lazy and happy on breathing machine support and the lungs do not want to go back to working so hard which is why people with sick lungs do not do well with breathing machines or after being on a breathing machine. [name] [name] understands but also wanting to give her mother more time to see if she will improve. I reassured [name] [name] that this is exactly what we will do. We discussed that her mother will show improvement or will continue to decline and have more trouble with her breathing and then our hands are tied.   [name] [name] is not yet ready for comfort care and struggling with her mother's poor prognosis. She will need ongoing support. She also agrees to hold off for now on feeding tube and I assured her that her mother was getting nutrition until her breathing tube was removed so this is okay to just let her rest for a couple days. We also discussed that the breathing tube will not alleviate the discomfort in her mother's throat but potentially make this worse. Will consider increasing anxiety medication. Also consider comfort sips when breathing optimized.   All questions/concerns addressed. Emotional support provided. Discussed with [name] Brawn, RN and Dr. Tamala Julian.   Exam: Alert, orientation difficult to determine due to fairly significant shortness of breath at rest with use of accessory muscles. Pale. Fatigued. RLE wrapped and dressing clean and intact.   Plan: - Comfort focus with further decline.  - Ongoing support to daughter, [name] [name]. - Continue supportive measures to optimize patient.   [place], NP Palliative Medicine Team Pager [phone] (Please see  [email or web address] for schedule) Team Phone [phone]    Greater than 50%  of this time was spent counseling and coordinating care related to the above assessment and plan

## 2020-02-15 NOTE — Progress Notes (Addendum)
NAME:  [name] [name], MRN:  [id number], DOB:  [birth year], LOS: 23 ADMISSION DATE:  01/26/2020, CONSULTATION DATE:  01/28/20 REFERRING MD:  Ortiz/ Triad, CHIEF COMPLAINT:  resp distress    Brief History   64 yo female smoker presented 9/05 with Rt foot pain and swelling.  This was associated with chills, myalgias from sepsis with cellulitis.  Developed A fib with RV and then hypoxia.  Required intubation 9/07 and PCCM consulted.  H&P   [name] [name] is a 64 y.o. 64 year old female with past medical history patient at home, hypertension, type 2 diabetes,, tobacco abuse, questionable, and chronic substance abuse who presented to the emergency department.  Symptoms began after hours to 2 weeks prior to and imaging.  Patient also reported fever, chills, body aches, and mild headache with general malaise prior to admission.  Patient attempted soaking foot with little to no improvement prior to admission.  She denied any chest pain, shortness of breath,.  Initially on arrival patient was treated for sepsis and required mechanical intubation.  PCCM was consulted morning of 9/07 originally.  Extubation was attempted 9/17 patient became stridorous progressed with strokes requiring ventilation.  Patient was transferred to [facility], 9/19 for further evaluation of possible tracheostomy placement and continued treatment for sepsis.  Past Medical History  COPD on home oxygen, HTN, DM, Bipolar  Significant Hospital Events   9/05 Admit 9/07 intubated 9/08 back in sinus rhythm 9/14 fever, Abx changed 9/16 debridement of Rt 2nd toe with evacuation of abscess, superficial debridement of Rt lung with punch biopsy x 2 9/17 Extubate, required reintubation due to stridor distress 9/19 transferred to [facility] for further evaluation and possible tracheostomy  Consults:  Cardiology Surgery Palliative care  Procedures:  ETT 9/07 >> 9/17 9/17 >  Lt PICC 9/12 >>   Significant Diagnostic Tests:  Echo 9/08 >>  EF 55 to 60% CT Rt leg 9/11 >> diffuse subcutaneous soft tissue swelling, moderate myofasciitis involving mid to lower calf, no osteomyelitis or septic arthritis CRX 9/17 > Right lower lobe collapse and right pleural effusion unchanged. Progressive left lower lobe atelectasis and effusion. Bronch 9/20 > clear secretions, no obstruction or collapse POCUS bedside chest [place] 9/20 > minimal fluid  Micro Data:  COVID 9/05 >> negative MRSA PCR 9/08 >> negative Blood 9/05 >> Group B Streptococcus Blood 9/07 >> negative BAL 9/20 >   Antimicrobials:  Vancomycin 9/05 Rocephin 9/05 Ancef 9/06 >> 9/10 Vancomycin 9/11 >> 9/15 Clindamycin 9/14 >>  Ancef 9/15 >>   Interim history/subjective:  Doing okay with precedex and fentanyl drips. Uses bipap intermittently. Denies pain or anxiety.  Objective   Blood pressure 104/66, pulse (!) 51, temperature 97.9 F (36.6 C), temperature source Axillary, resp. rate 20, height _0  (1.651 m), weight 89 kg, SpO2 97 %.        Intake/Output Summary (Last 24 hours) at 02/15/2020 1042 Last data filed at 02/15/2020 0900 Gross per 24 hour  Intake 672.42 ml  Output 2390 ml  Net -1717.58 ml   Filed Weights   02/11/20 0500 02/13/20 0500 02/14/20 0500  Weight: 102.4 kg 91.4 kg 89 kg    Examination: Constitutional: chronically ill woman in no acute distress Eyes: eyes are anicteric, reactive to light Ears, nose, mouth, and throat: mucous membranes dry, trachea midline Cardiovascular: heart sounds are regular, ext are warm to touch. trace edema Respiratory: wheezing bilaterally with poor air movement, less accessory muscle use today Gastrointestinal: abdomen is soft with + BS Skin: No rashes,  normal turgor, RLE wrapped Neurologic: Moves all 4 ext to command Psychiatric: anxious  Renal function stable CBC benign K/mg low and repleting  Assessment & Plan:   Acute on chronic hypoxic/hypercapnic respiratory failure - Secondary to acute pulmonary edema  in setting of A fib with RVR, sepsis resuscitation, COPD exacerbation, and mucus plugging.  S/p multiple therapeutic bronchoscopies. Acinetobacter in culture. - Continue to push diuresis as renal function allows - Meropenem x 7 days - See next issue  Underlying anxiety, air hunger, weakness, secretion clearance problems- family does not want to pursue trach/PEG LTACH so we are giving her trial off vent and if she languishes she will be transitioned to full comfort care - Opiates for air hunger - precedex - BIPAP can be tried for comfort - Nebs as ordered - Restart home seroquel  Group B Streptococcal bacteremia - Secondary to Rt lower leg cellulitis, ulcer, abscess s/p debridement on 9/16. 01/29/2020: Surface echo negative normal EF no veg. -Continue antimicrobials, broadened to meropenem. - Appreciate Dr. Jess Barters input, will potentially need AKA if she stabilizes from a respiratory perspective.  This is still in question.  Transient A fib with RVR. - Has not recurred - Okay to stop heparin gtt as this was likely reactive - Can consider holter monitor as OP - Stop dilt as she is bradycardic  Goals of care. - Palliative care following - Patient's functional status at baseline she does not leave the home.  Sits and watches TV most of the day, noncompliant with home O2.  Severe COPD. - Daughter is having hard time coming to grips with her mother's mortality  History of dysphagia. Ferrel Logan for comfort, may need cortrak  Deconditioning. -We will need PT OT at some point  Best practice:  Diet: sips okay DVT prophylaxis: heparin ppx GI prophylaxis: protonix Mobility: bed rest Code Status: DNR Disposition: ICU due to titration of various drips, re-affirming family wishes, and intermittent PRN BIPAP use Family updates: Daughter updated 9/24 at length  This patient is critically ill with multiple organ system failure; which, requires frequent high complexity decision making, assessment,  support, evaluation, and titration of therapies. This was completed through the application of advanced monitoring technologies and extensive interpretation of multiple databases. During this encounter critical care time was devoted to patient care services described in this note for 39 minutes.  Candee Furbish, MD [facility] 02/15/2020 10:42 AM

## 2020-02-15 NOTE — Progress Notes (Addendum)
Patient on 4L  w/moderate DOE and at rest, grunting with expiratory wheezes, diaphoresis, accessory muscle use, and orthopnea. SaO2 maintained at >90%.  Denies pain or anxiety, but appears uncomfortable and shows discomfort with work of breathing and frequently asks for "my medicine".  Daughter says she takes something (unknown) at home.    Managing w/PRN IVP Ativan, Precedex gtt, Fentanyl gtt.  Transitioned to Ativan gtt today - tolerating better with decreased accessory muscle use. C/W Fentanyl gtt, Precedex gtt.  RR stablized, patient resting comfortably, easily arousable. VSS.  Daughter updated, visited, called, updated.

## 2020-02-15 NOTE — Progress Notes (Addendum)
Fentanyl IV bag, spiked for administration, leaked (hole in the bag noted after spiked). Wasted immediately after puncture in appropriate waste bin with [employer] and [employer]. [place] witnessed in room with Charlynne Cousins.    Prior empty Fentanyl bag (zero waste) discarded per protocol - witnessed with Charlynne Cousins.

## 2020-02-16 LAB — GLUCOSE, CAPILLARY
Glucose-Capillary: 117 mg/dL — ABNORMAL HIGH (ref 70–99)
Glucose-Capillary: 127 mg/dL — ABNORMAL HIGH (ref 70–99)
Glucose-Capillary: 128 mg/dL — ABNORMAL HIGH (ref 70–99)
Glucose-Capillary: 133 mg/dL — ABNORMAL HIGH (ref 70–99)
Glucose-Capillary: 139 mg/dL — ABNORMAL HIGH (ref 70–99)
Glucose-Capillary: 143 mg/dL — ABNORMAL HIGH (ref 70–99)

## 2020-02-16 LAB — CBC WITH DIFFERENTIAL/PLATELET
Abs Immature Granulocytes: 0.07 10*3/uL (ref 0.00–0.07)
Basophils Absolute: 0.1 10*3/uL (ref 0.0–0.1)
Basophils Relative: 2 %
Eosinophils Absolute: 0.1 10*3/uL (ref 0.0–0.5)
Eosinophils Relative: 2 %
HCT: 44 % (ref 36.0–46.0)
Hemoglobin: 13.6 g/dL (ref 12.0–15.0)
Immature Granulocytes: 1 %
Lymphocytes Relative: 39 %
Lymphs Abs: 2.7 10*3/uL (ref 0.7–4.0)
MCH: 27.6 pg (ref 26.0–34.0)
MCHC: 30.9 g/dL (ref 30.0–36.0)
MCV: 89.4 fL (ref 80.0–100.0)
Monocytes Absolute: 0.7 10*3/uL (ref 0.1–1.0)
Monocytes Relative: 10 %
Neutro Abs: 3.2 10*3/uL (ref 1.7–7.7)
Neutrophils Relative %: 46 %
Platelets: 203 10*3/uL (ref 150–400)
RBC: 4.92 MIL/uL (ref 3.87–5.11)
RDW: 14 % (ref 11.5–15.5)
WBC: 6.8 10*3/uL (ref 4.0–10.5)
nRBC: 0 % (ref 0.0–0.2)

## 2020-02-16 LAB — BASIC METABOLIC PANEL
Anion gap: 9 (ref 5–15)
BUN: 43 mg/dL — ABNORMAL HIGH (ref 8–23)
CO2: 40 mmol/L — ABNORMAL HIGH (ref 22–32)
Calcium: 9.7 mg/dL (ref 8.9–10.3)
Chloride: 87 mmol/L — ABNORMAL LOW (ref 98–111)
Creatinine, Ser: 0.42 mg/dL — ABNORMAL LOW (ref 0.44–1.00)
GFR calc Af Amer: >60 mL/min (ref >60)
GFR calc non Af Amer: >60 mL/min (ref >60)
Glucose, Bld: 133 mg/dL — ABNORMAL HIGH (ref 70–99)
Potassium: 3.2 mmol/L — ABNORMAL LOW (ref 3.5–5.1)
Sodium: 136 mmol/L (ref 135–145)

## 2020-02-16 LAB — MAGNESIUM: Magnesium: 1.9 mg/dL (ref 1.7–2.4)

## 2020-02-16 MED ORDER — POTASSIUM CHLORIDE 10 MEQ/100ML IV SOLN
10.0000 meq | INTRAVENOUS | Status: AC
  Administered 2020-02-16 (×2): 10 meq via INTRAVENOUS
  Filled 2020-02-16 (×2): qty 100

## 2020-02-16 NOTE — Progress Notes (Signed)
NAME:  [name] [name], MRN:  [id number], DOB:  May 23, 1956, LOS: 21 ADMISSION DATE:  01/26/2020, CONSULTATION DATE:  01/28/20 REFERRING MD:  Ortiz/ Triad, CHIEF COMPLAINT:  resp distress    Brief History   64 yo female smoker presented 9/05 with Rt foot pain and swelling.  This was associated with chills, myalgias from sepsis with cellulitis.  Developed A fib with RV and then hypoxia.  Required intubation 9/07 and PCCM consulted.  H&P   [name] [name] is a 64 y.o. 64 year old female with past medical history patient at home, hypertension, type 2 diabetes,, tobacco abuse, questionable, and chronic substance abuse who presented to the emergency department.  Symptoms began after hours to 2 weeks prior to and imaging.  Patient also reported fever, chills, body aches, and mild headache with general malaise prior to admission.  Patient attempted soaking foot with little to no improvement prior to admission.  She denied any chest pain, shortness of breath,.  Initially on arrival patient was treated for sepsis and required mechanical intubation.  PCCM was consulted morning of 9/07 originally.  Extubation was attempted 9/17 patient became stridorous progressed with strokes requiring ventilation.  Patient was transferred to [facility], 9/19 for further evaluation of possible tracheostomy placement and continued treatment for sepsis.  Past Medical History  COPD on home oxygen, HTN, DM, Bipolar  Significant Hospital Events   9/05 Admit 9/07 intubated 9/08 back in sinus rhythm 9/14 fever, Abx changed 9/16 debridement of Rt 2nd toe with evacuation of abscess, superficial debridement of Rt lung with punch biopsy x 2 9/17 Extubate, required reintubation due to stridor distress 9/19 transferred to [facility] for further evaluation and possible tracheostomy  Consults:  Cardiology Surgery Palliative care  Procedures:  ETT 9/07 >> 9/17 9/17 >  Lt PICC 9/12 >>   Significant Diagnostic Tests:  Echo 9/08 >>  EF 55 to 60% CT Rt leg 9/11 >> diffuse subcutaneous soft tissue swelling, moderate myofasciitis involving mid to lower calf, no osteomyelitis or septic arthritis CRX 9/17 > Right lower lobe collapse and right pleural effusion unchanged. Progressive left lower lobe atelectasis and effusion. Bronch 9/20 > clear secretions, no obstruction or collapse POCUS bedside chest [place] 9/20 > minimal fluid  Micro Data:  COVID 9/05 >> negative MRSA PCR 9/08 >> negative Blood 9/05 >> Group B Streptococcus Blood 9/07 >> negative BAL 9/20 >   Antimicrobials:  Vancomycin 9/05 Rocephin 9/05 Ancef 9/06 >> 9/10 Vancomycin 9/11 >> 9/15 Clindamycin 9/14 >>  Ancef 9/15 >>   Interim history/subjective:  No events. Now on ativan, fentanyl, and precedex drips. Denies dyspnea or pain, awakens easily to voice.  Objective   Blood pressure 98/69, pulse (!) 59, temperature 97.8 F (36.6 C), temperature source Axillary, resp. rate 18, height _0  (1.651 m), weight 88.8 kg, SpO2 98 %.        Intake/Output Summary (Last 24 hours) at 02/16/2020 0850 Last data filed at 02/16/2020 0751 Gross per 24 hour  Intake 1372.17 ml  Output 1785 ml  Net -412.83 ml   Filed Weights   02/13/20 0500 02/14/20 0500 02/16/20 0500  Weight: 91.4 kg 89 kg 88.8 kg    Examination: Constitutional: chronically ill woman in no acute distress Eyes: eyes are anicteric, reactive to light Ears, nose, mouth, and throat: mucous membranes dry, trachea midline Cardiovascular: heart sounds are regular, ext are warm to touch. trace edema Respiratory: wheezing bilaterally with poor air movement, less accessory muscle use today Gastrointestinal: abdomen is soft with + BS  Skin: No rashes, normal turgor, RLE wrapped Neurologic: Moves all 4 ext to command Psychiatric: anxious  Renal function stable CBC benign K/mg low and repleting  Assessment & Plan:   Acute on chronic hypoxic/hypercapnic respiratory failure - Secondary to acute  pulmonary edema in setting of A fib with RVR, sepsis resuscitation, COPD exacerbation, and mucus plugging.  S/p multiple therapeutic bronchoscopies. Acinetobacter in culture. - Continue to push diuresis as renal function allows - Meropenem x 7 days - See next issue  Group B Streptococcal bacteremia - Secondary to Rt lower leg cellulitis, ulcer, abscess s/p debridement on 9/16. 01/29/2020: Surface echo negative normal EF no veg. -Continue antimicrobials, broadened to meropenem. - Appreciate Dr. Jess Barters input, will potentially need AKA if she stabilizes from a respiratory perspective.  This is still in question.  Transient A fib with RVR. - Has not recurred - Okay to stop heparin gtt as this was likely reactive  Goals of care. - Palliative care following - Patient's functional status at baseline she does not leave the home.  Sits and watches TV most of the day, noncompliant with home O2.  Severe COPD. - Daughter is having hard time coming to grips with her mother's mortality  History of dysphagia. -Not protecting airway well  Underlying anxiety, air hunger, weakness, secretion clearance problems, approaching end of life- family does not want to pursue trach/PEG LTACH so we are giving her trial off vent and if she languishes she will be transitioned to full comfort care.  Daughter who is local has emotional and psychiatric instability are making this process difficult. - Will reach out to daughter in [place] today to see if we can all come to a conclusion whether to work toward making [place] full comfort care    Best practice:  Diet: sips okay DVT prophylaxis: heparin ppx GI prophylaxis: protonix Mobility: bed rest Code Status: DNR Disposition: ICU due to titration of various drips, re-affirming family wishes, and intermittent PRN BIPAP use Family updates: Daughter updated 9/24 at length  This patient is critically ill with multiple organ system failure; which, requires frequent  high complexity decision making, assessment, support, evaluation, and titration of therapies. This was completed through the application of advanced monitoring technologies and extensive interpretation of multiple databases. During this encounter critical care time was devoted to patient care services described in this note for 48 minutes.  Candee Furbish, MD [facility] 02/16/2020 8:50 AM

## 2020-02-16 NOTE — Progress Notes (Signed)
Met with daughter and patient who is awake and oriented. We discussed her leg and she is comfortable with proceeding with AKA. When discussing weaning from ventilator and tracheostomy, she is agreeable to this as well. In the event of terminal decline in interim, she would not want CPR/shock/emergent intubation. Continue current measures including PRN BIPAP as patient is doing reasonably well with them. Will touch base with Dr. Sharol Given tomorrow and let him know patient is interested in proceeding with surgery.  Erskine Emery MD PCCM

## 2020-02-17 ENCOUNTER — Other Ambulatory Visit: Payer: Self-pay | Admitting: Physician Assistant

## 2020-02-17 DIAGNOSIS — L03115 Cellulitis of right lower limb: Secondary | ICD-10-CM | POA: Diagnosis not present

## 2020-02-17 DIAGNOSIS — R627 Adult failure to thrive: Secondary | ICD-10-CM | POA: Diagnosis not present

## 2020-02-17 DIAGNOSIS — R0600 Dyspnea, unspecified: Secondary | ICD-10-CM | POA: Diagnosis not present

## 2020-02-17 DIAGNOSIS — Z515 Encounter for palliative care: Secondary | ICD-10-CM | POA: Diagnosis not present

## 2020-02-17 LAB — CBC WITH DIFFERENTIAL/PLATELET
Abs Immature Granulocytes: 0.06 10*3/uL (ref 0.00–0.07)
Basophils Absolute: 0.1 10*3/uL (ref 0.0–0.1)
Basophils Relative: 1 %
Eosinophils Absolute: 0.1 10*3/uL (ref 0.0–0.5)
Eosinophils Relative: 2 %
HCT: 48.1 % — ABNORMAL HIGH (ref 36.0–46.0)
Hemoglobin: 14.6 g/dL (ref 12.0–15.0)
Immature Granulocytes: 1 %
Lymphocytes Relative: 36 %
Lymphs Abs: 2.3 10*3/uL (ref 0.7–4.0)
MCH: 27.3 pg (ref 26.0–34.0)
MCHC: 30.4 g/dL (ref 30.0–36.0)
MCV: 90.1 fL (ref 80.0–100.0)
Monocytes Absolute: 0.7 10*3/uL (ref 0.1–1.0)
Monocytes Relative: 11 %
Neutro Abs: 3.2 10*3/uL (ref 1.7–7.7)
Neutrophils Relative %: 49 %
Platelets: 199 10*3/uL (ref 150–400)
RBC: 5.34 MIL/uL — ABNORMAL HIGH (ref 3.87–5.11)
RDW: 13.8 % (ref 11.5–15.5)
WBC: 6.4 10*3/uL (ref 4.0–10.5)
nRBC: 0 % (ref 0.0–0.2)

## 2020-02-17 LAB — BASIC METABOLIC PANEL
Anion gap: 10 (ref 5–15)
BUN: 41 mg/dL — ABNORMAL HIGH (ref 8–23)
CO2: 36 mmol/L — ABNORMAL HIGH (ref 22–32)
Calcium: 9.6 mg/dL (ref 8.9–10.3)
Chloride: 92 mmol/L — ABNORMAL LOW (ref 98–111)
Creatinine, Ser: 0.41 mg/dL — ABNORMAL LOW (ref 0.44–1.00)
GFR calc Af Amer: >60 mL/min (ref >60)
GFR calc non Af Amer: >60 mL/min (ref >60)
Glucose, Bld: 119 mg/dL — ABNORMAL HIGH (ref 70–99)
Potassium: 5.4 mmol/L — ABNORMAL HIGH (ref 3.5–5.1)
Sodium: 138 mmol/L (ref 135–145)

## 2020-02-17 LAB — MAGNESIUM: Magnesium: 1.9 mg/dL (ref 1.7–2.4)

## 2020-02-17 LAB — GLUCOSE, CAPILLARY
Glucose-Capillary: 112 mg/dL — ABNORMAL HIGH (ref 70–99)
Glucose-Capillary: 120 mg/dL — ABNORMAL HIGH (ref 70–99)
Glucose-Capillary: 127 mg/dL — ABNORMAL HIGH (ref 70–99)
Glucose-Capillary: 142 mg/dL — ABNORMAL HIGH (ref 70–99)
Glucose-Capillary: 144 mg/dL — ABNORMAL HIGH (ref 70–99)
Glucose-Capillary: 149 mg/dL — ABNORMAL HIGH (ref 70–99)

## 2020-02-17 LAB — POTASSIUM: Potassium: 4.1 mmol/L (ref 3.5–5.1)

## 2020-02-17 MED ORDER — MAGNESIUM SULFATE 2 GM/50ML IV SOLN
2.0000 g | Freq: Once | INTRAVENOUS | Status: AC
  Administered 2020-02-17: 2 g via INTRAVENOUS
  Filled 2020-02-17: qty 50

## 2020-02-17 MED ORDER — CLINDAMYCIN PHOSPHATE 900 MG/50ML IV SOLN: 900.0000 mg | INTRAVENOUS | Status: DC

## 2020-02-17 MED ORDER — JEVITY 1.2 CAL PO LIQD
1000.0000 mL | ORAL | Status: DC
  Administered 2020-02-18 – 2020-02-25 (×7): 1000 mL
  Filled 2020-02-17 (×16): qty 1000

## 2020-02-17 MED ORDER — PROSOURCE TF PO LIQD
45.0000 mL | Freq: Three times a day (TID) | ORAL | Status: DC
  Administered 2020-02-17 – 2020-02-25 (×23): 45 mL
  Filled 2020-02-17 (×25): qty 45

## 2020-02-17 MED ORDER — FOLIC ACID 5 MG/ML IJ SOLN
1.0000 mg | Freq: Every day | INTRAMUSCULAR | Status: DC
  Administered 2020-02-17: 1 mg via INTRAVENOUS
  Filled 2020-02-17 (×2): qty 0.2

## 2020-02-17 MED ORDER — GUAIFENESIN 100 MG/5ML PO SOLN
10.0000 mL | ORAL | Status: DC | PRN
  Administered 2020-02-17: 200 mg
  Filled 2020-02-17: qty 10

## 2020-02-17 MED ORDER — CHLORHEXIDINE GLUCONATE 0.12 % MT SOLN
OROMUCOSAL | Status: AC
  Administered 2020-02-17: 15 mL via OROMUCOSAL
  Filled 2020-02-17: qty 15

## 2020-02-17 MED ORDER — PANTOPRAZOLE SODIUM 40 MG IV SOLR
40.0000 mg | INTRAVENOUS | Status: DC
  Administered 2020-02-17 – 2020-02-24 (×8): 40 mg via INTRAVENOUS
  Filled 2020-02-17 (×9): qty 40

## 2020-02-17 NOTE — Evaluation (Signed)
Clinical/Bedside Swallow Evaluation Patient Details  Name: [name] [name] MRN: [id number] Date of Birth: [birth year]  Today's Date: 02/17/2020 Time: SLP Start Time (ACUTE ONLY): 1200 SLP Stop Time (ACUTE ONLY): 1225 SLP Time Calculation (min) (ACUTE ONLY): 25 min  Past Medical History:  Past Medical History:  Diagnosis Date  . Anxiety   . Bipolar 1 disorder ([place])   . Cancer ([place])    colon  . Chronic diarrhea    Followed by Dr [name] [name]   . Complication of anesthesia    Hx resp distress during surgery  . COPD (chronic obstructive pulmonary disease) ([place])   . Depression   . Diabetes mellitus ([place])   . External hemorrhoids   . GI bleed   . Headache(784.0)   . Hyperlipidemia   . Hypertension   . Hypothyroidism   . Insomnia   . Internal hemorrhoids   . Polysubstance abuse ([place]) Quit 2008    H/O methamphetamine and alcohol abuse, clean x 6 years  . Shortness of breath   . Suicidal thoughts   . Tobacco abuse   . Tubular adenoma of colon    Past Surgical History:  Past Surgical History:  Procedure Laterality Date  . ABDOMINAL HYSTERECTOMY    . APPENDECTOMY    . COLON SURGERY     "colon repair"  . COLONOSCOPY WITH PROPOFOL N/A 12/11/2012   Procedure: COLONOSCOPY WITH PROPOFOL;  Surgeon: [name] Bears, MD;  Location: [facility];  Service: Gastroenterology;  Laterality: N/A;  . FINGER SURGERY     middle left finger  . HERNIA REPAIR     x 2  . KNEE SURGERY Left    Left knee tendon surgery  . TONSILLECTOMY    . TUBAL LIGATION    . WOUND DEBRIDEMENT Right 02/06/2020   Procedure: SKIN BIOPSY WITH DEBRIDEMENT RIGHT LOWER LEG;  Surgeon: [name] Cagey, MD;  Location: [facility];  Service: General;  Laterality: Right;   HPI:  64yo female admitted 01/26/20 with right foot pain and swelling, as well as chills, myalgias. Multiple intubations 9/7-17, 9/17-23. PMH: COPD HTN, DM2, tobacco abuse, chronic substance abuse, BiPolar, colon cancer, anxiety/depression   Assessment / Plan /  Recommendation Clinical Impression  Pt seen at bedside for assessment of PO readiness. RN consulted and chart reviewed. Pt with history of multiple intubations, and currently exhibiting aphonic voice quality. This raises concern for poor airway protection due to lack of vocal fold closure. Pt exhibits poor breath support for speech, with rapid shallow breathing noted. CN exam is unremarkable. Volitional cough is significantly weak. Oral care was completed with suction. Pt is edentulous. Oral cavity pink, moist, and healthy. Following oral care, pt accepted trials of ice chips. Oral manipulation was noted to be functional, without anterior leakage or oral residue. Laryngeal elevation seemed reduced to palpation. Pt exhibited a very weak nonproductive cough following ice chip trials, with shallow rapid breaths observed. Pt is at significantly high risk of aspiration at this time, and does not appear to have the respiratory strength to remove penetrated or aspirated material.   At this time, po intake is not recommended, including medications. Pt may be given individual ice chips after oral care for comfort and oral moisture, when awake and seated upright. RN reports pt may have surgery tomorrow for AKA. SLP will follow up after surgery to determine readiness for po intake/instrumental assessment. FEES may be beneficial to visualize vocal folds, given voice quality after intubation. RN and MD informed.   SLP Visit Diagnosis: Dysphagia,  unspecified (R13.10)    Aspiration Risk  Severe aspiration risk    Diet Recommendation Ice chips PRN after oral care;NPO except meds   Medication Administration: Via alternative means    Other  Recommendations Oral Care Recommendations: Oral care QID;Oral care prior to ice chip/H20;Staff/trained caregiver to provide oral care Other Recommendations: Have oral suction available   Follow up Recommendations Other (comment) (TBD)      Frequency and Duration min 2x/week   2 weeks       Prognosis Prognosis for Safe Diet Advancement: Fair Barriers to Reach Goals: Severity of deficits      Swallow Study   General Date of Onset: 01/26/20 HPI: 64yo female admitted 01/26/20 with right foot pain and swelling, as well as chills, myalgias. Multiple intubations 9/7-17, 9/17-23. PMH: COPD HTN, DM2, tobacco abuse, chronic substance abuse, BiPolar, colon cancer, anxiety/depression Type of Study: Bedside Swallow Evaluation Previous Swallow Assessment: none Diet Prior to this Study: NPO Temperature Spikes Noted: No Respiratory Status: Nasal cannula History of Recent Intubation: Yes Length of Intubations (days): 16 days Date extubated: 02/13/20 Behavior/Cognition: Alert;Cooperative Oral Cavity Assessment: Within Functional Limits Oral Care Completed by SLP: Yes Oral Cavity - Dentition: Edentulous Vision: Functional for self-feeding Self-Feeding Abilities: Total assist Patient Positioning: Upright in bed Baseline Vocal Quality: Breathy;Aphonic Volitional Cough: Weak Volitional Swallow: Able to elicit    Oral/Motor/Sensory Function Overall Oral Motor/Sensory Function: Within functional limits   Ice Chips Ice chips: Impaired Presentation: Spoon Pharyngeal Phase Impairments: Suspected delayed Swallow;Cough - Immediate    [name] [name]. [name] [name], [facility], [place] Speech Language Pathologist Office: [phone]  [name] [name] 02/17/2020,12:49 PM

## 2020-02-17 NOTE — Procedures (Signed)
Cortrak  Person Inserting Tube:  Teniqua Marron, RD Tube Type:  Cortrak - 43 inches Tube Location:  Left nare Initial Placement:  Stomach Secured by: Bridle Technique Used to Measure Tube Placement:  Documented cm marking at nare/ corner of mouth Cortrak Secured At:  65 cm   No x-ray is required. RN may begin using tube.   If the tube becomes dislodged please keep the tube and contact the Cortrak team at [email or web address] (password [email or web address]) for replacement.  If after hours and replacement cannot be delayed, place a NG tube and confirm placement with an abdominal x-ray.    Delorean Knutzen RD, LDN Clinical Nutrition Pager listed in AMION    

## 2020-02-17 NOTE — Progress Notes (Signed)
Nutrition Follow-up  DOCUMENTATION CODES:   Not applicable  INTERVENTION:   Initiate tube feeding via Cortrak: Jevity 1.2 at 30 ml/h, increase by 10 ml every 4 hours to goal rate of 70 ml/h (1680 ml per day) Prosource TF 45 ml TID  Provides 2136 kcal, 126 gm protein, 1361 ml free water daily  NUTRITION DIAGNOSIS:   Increased nutrient needs related to post-op healing as evidenced by estimated needs.  Ongoing   GOAL:   Patient will meet greater than or equal to 90% of their needs  Progressing   MONITOR:   TF tolerance, Diet advancement, Labs, Skin  REASON FOR ASSESSMENT:   Ventilator    ASSESSMENT:   Patient with PMH significant for COPD, DM, HTN, and bipolar disorder. Presents to [facility] with R toe cellulitis and acute pulmonary edema. Transferred to [facility] for possible trach placement.  Discussed patient in ICU rounds and with RN today. Extubated 9/23. Palliative Care team following. Plans to continue supportive care, but no re-intubation, trach, or PEG.  S/P bedside swallow evaluation with SLP this morning. Patient is not safe for PO intake at this time. Remains NPO. Cortrak placed today with tip in the stomach.  Plans for orthopedic surgeon to consult with patient this week to determine timing of amputation.   Labs reviewed. K 5.4 CBG: 112-120-127-149  Medications reviewed and include decadron, folic acid, novolog, thiamine, keppra.  Weight 85.9 kg currently 99.8 kg on admission I/O +14.4 L since admission UOP 1,040 ml x 24 hours   Diet Order:   Diet Order            Diet NPO time specified  Diet effective now                 EDUCATION NEEDS:   No education needs have been identified at this time  Skin:  Skin Assessment: Skin Integrity Issues: Skin Integrity Issues:: Other (Comment) Other: non pressure wound R pretibial; cellutitis 2nd toe right foot  Last BM:  9/26 rectal pouch  Height:   Ht Readings from Last 1 Encounters:   02/09/20 5\' 5"  (1.651 m)    Weight:   Wt Readings from Last 1 Encounters:  02/17/20 85.9 kg    Ideal Body Weight:  56.8 kg  BMI:  Body mass index is 31.51 kg/m.  Estimated Nutritional Needs:   Kcal:  2000-2200  Protein:  110-140 gm  Fluid:  >/= 2 L/day    Lucas Mallow, RD, LDN, CNSC Please refer to Amion for contact information.

## 2020-02-17 NOTE — Progress Notes (Signed)
eLink Physician-Brief Progress Note Patient Name: [name] [name] DOB: [birth year] MRN: [id number]   Date of Service  02/17/2020  HPI/Events of Note  Nursing request for expectorant.   eICU Interventions  Plan: 1. Robitussin 10 mL per tube Q 4 hour PRN cough or lossen phlegm.      Intervention Category Major Interventions: Other:  Lysle Dingwall 02/17/2020, 9:59 PM

## 2020-02-17 NOTE — Progress Notes (Signed)
NAME:  [name] [name], MRN:  [id number], DOB:  [birth year], LOS: 70 ADMISSION DATE:  01/26/2020, CONSULTATION DATE:  01/28/20 REFERRING MD:  Ortiz/ Triad, CHIEF COMPLAINT:  resp distress    Brief History   64 yo female smoker presented 9/05 with Rt foot pain and swelling.  This was associated with chills, myalgias from sepsis with cellulitis.  Developed A fib with RV and then hypoxia.  Required intubation 9/07 and PCCM consulted.  H&P   [name] [name] is a 64 y.o. 64 year old female with past medical history patient at home, hypertension, type 2 diabetes,, tobacco abuse, questionable, and chronic substance abuse who presented to the emergency department.  Symptoms began after hours to 2 weeks prior to and imaging.  Patient also reported fever, chills, body aches, and mild headache with general malaise prior to admission.  Patient attempted soaking foot with little to no improvement prior to admission.  She denied any chest pain, shortness of breath,.  Initially on arrival patient was treated for sepsis and required mechanical intubation.  PCCM was consulted morning of 9/07 originally.  Extubation was attempted 9/17 patient became stridorous progressed with strokes requiring ventilation.  Patient was transferred to [facility], 9/19 for further evaluation of possible tracheostomy placement and continued treatment for sepsis.  Past Medical History  COPD on home oxygen, HTN, DM, Bipolar  Significant Hospital Events   9/05 Admit 9/07 intubated 9/08 back in sinus rhythm 9/14 fever, Abx changed 9/16 debridement of Rt 2nd toe with evacuation of abscess, superficial debridement of Rt lung with punch biopsy x 2 9/17 Extubate, required reintubation due to stridor distress 9/19 transferred to [facility] for further evaluation and possible tracheostomy  Consults:  Cardiology Surgery Palliative care  Procedures:  ETT 9/07 >> 9/17 9/17 >  Lt PICC 9/12 >>   Significant Diagnostic Tests:  Echo 9/08 >>  EF 55 to 60% CT Rt leg 9/11 >> diffuse subcutaneous soft tissue swelling, moderate myofasciitis involving mid to lower calf, no osteomyelitis or septic arthritis CRX 9/17 > Right lower lobe collapse and right pleural effusion unchanged. Progressive left lower lobe atelectasis and effusion. Bronch 9/20 > clear secretions, no obstruction or collapse POCUS bedside chest [place] 9/20 > minimal fluid  Micro Data:  COVID 9/05 >> negative MRSA PCR 9/08 >> negative Blood 9/05 >> Group B Streptococcus Blood 9/07 >> negative BAL 9/20 >   Antimicrobials:  Vancomycin 9/05 Rocephin 9/05 Ancef 9/06 >> 9/10 Vancomycin 9/11 >> 9/15 Clindamycin 9/14 >>  Ancef 9/15 >>   Interim history/subjective:  No events, anxious this AM regarding surgery. Breathing status stable.  Objective   Blood pressure 131/76, pulse 85, temperature 98.6 F (37 C), temperature source Axillary, resp. rate 16, height _0  (1.651 m), weight 85.9 kg, SpO2 98 %.        Intake/Output Summary (Last 24 hours) at 02/17/2020 1150 Last data filed at 02/17/2020 0700 Gross per 24 hour  Intake 886.28 ml  Output 840 ml  Net 46.28 ml   Filed Weights   02/16/20 0500 02/17/20 0500 02/17/20 0644  Weight: 88.8 kg 88.6 kg 85.9 kg    Examination: Constitutional: chronically ill woman in no acute distress Eyes: eyes are anicteric, reactive to light Ears, nose, mouth, and throat: mucous membranes dry, trachea midline Cardiovascular: heart sounds are regular, ext are warm to touch. trace edema Respiratory: still wheezing, no accessory muscle use Gastrointestinal: abdomen is soft with + BS Skin: No rashes, normal turgor, RLE wrapped Neurologic: Moves all 4 ext to  command Psychiatric: anxious  Renal function stable CBC benign  Assessment & Plan:   Acute on chronic hypoxic/hypercapnic respiratory failure - Secondary to acute pulmonary edema in setting of A fib with RVR, sepsis resuscitation, COPD exacerbation, and mucus plugging.   S/p multiple therapeutic bronchoscopies. Acinetobacter in culture. - Continue to push diuresis as renal function allows - Meropenem x 7 days - See next issue  Group B Streptococcal bacteremia - Secondary to Rt lower leg cellulitis, ulcer, abscess s/p debridement on 9/16. 01/29/2020: Surface echo negative normal EF no veg. -Continue antimicrobials, broadened to meropenem. - Dr. Sharol Given to see patient tomorrow to discuss timing of AKA  Transient A fib with RVR. - Has not recurred - Okay to stop heparin gtt as this was likely reactive  Goals of care. - Palliative care following - Patient's functional status at baseline she does not leave the home.  Sits and watches TV most of the day, noncompliant with home O2.  Severe COPD. - Daughter is having hard time coming to grips with her mother's mortality - Plan is surgery and if she has trouble with weaning from vent we will do tracheostomy  History of dysphagia. -Not protecting airway well, SLP consult but doubt safe to take PO  Underlying anxiety, air hunger, weakness, secretion clearance problems - See discussion 9/26 regarding path forward, remains DNR, continue drips for comfort as ordered but not full comfort care  Best practice:  Diet: sips okay DVT prophylaxis: heparin ppx GI prophylaxis: protonix Mobility: bed rest Code Status: DNR Disposition: ICU due to titration of various drips, re-affirming family wishes, and intermittent PRN BIPAP use Family updates: Daughter updated 9/24 at length  This patient is critically ill with multiple organ system failure; which, requires frequent high complexity decision making, assessment, support, evaluation, and titration of therapies. This was completed through the application of advanced monitoring technologies and extensive interpretation of multiple databases. During this encounter critical care time was devoted to patient care services described in this note for 38 minutes.  [name] Furbish,  MD [place] [facility] 02/17/2020 11:50 AM

## 2020-02-17 NOTE — Plan of Care (Signed)

## 2020-02-17 NOTE — Progress Notes (Signed)
Patient ID: [name] [name], female   DOB: 01-23-1956, 64 y.o.   MRN: [id number]  This NP visited patient at the bedside as a follow up for palliative medicine needs and emotional support.    Patient is alert,  speech is garbled and she is short of breath on minimal exertion.  I attempted to include her in conversation regarding her current medical situation but she does not have capacity at this time, with intermittent confusion.   [name]/daughter  is at the bedside, many questions and concerns.    Concerns regarding "I want to talk with the surgeon and I him to look at her foot before decision is made for amputation" ( discussed with APP working with Dr Sharol Given, he will see patient first thing in the morning) and questions regarding initiation of orals addressed, (speech evaluation ordered.)   Education offered regarding risk of aspiration.   Again education offered to daughter regarding the patient's frailty and  high risk for decompensation. We discussed failure to thrive.    This is a very hard time for the daughter.   Emotional support offered  This patient and her family will need ongoing support in navigating health care decisions .           Questions and concerns addressed   Discussed with Dr Tamala Julian and bedside RN   Total time spent on the unit was 25 minutes    PMT will continue to support holisically  Greater than 50% of the time was spent in counseling and coordination of care  [name] NP  Palliative Medicine Team Team Phone # [phone] Pager [phone]

## 2020-02-18 LAB — CBC WITH DIFFERENTIAL/PLATELET
Abs Immature Granulocytes: 0.03 10*3/uL (ref 0.00–0.07)
Basophils Absolute: 0.1 10*3/uL (ref 0.0–0.1)
Basophils Relative: 1 %
Eosinophils Absolute: 0.1 10*3/uL (ref 0.0–0.5)
Eosinophils Relative: 2 %
HCT: 45 % (ref 36.0–46.0)
Hemoglobin: 13.8 g/dL (ref 12.0–15.0)
Immature Granulocytes: 1 %
Lymphocytes Relative: 32 %
Lymphs Abs: 1.8 10*3/uL (ref 0.7–4.0)
MCH: 28.1 pg (ref 26.0–34.0)
MCHC: 30.7 g/dL (ref 30.0–36.0)
MCV: 91.6 fL (ref 80.0–100.0)
Monocytes Absolute: 0.6 10*3/uL (ref 0.1–1.0)
Monocytes Relative: 11 %
Neutro Abs: 3 10*3/uL (ref 1.7–7.7)
Neutrophils Relative %: 53 %
Platelets: 196 10*3/uL (ref 150–400)
RBC: 4.91 MIL/uL (ref 3.87–5.11)
RDW: 14.2 % (ref 11.5–15.5)
WBC: 5.6 10*3/uL (ref 4.0–10.5)
nRBC: 0 % (ref 0.0–0.2)

## 2020-02-18 LAB — GLUCOSE, CAPILLARY
Glucose-Capillary: 118 mg/dL — ABNORMAL HIGH (ref 70–99)
Glucose-Capillary: 147 mg/dL — ABNORMAL HIGH (ref 70–99)
Glucose-Capillary: 182 mg/dL — ABNORMAL HIGH (ref 70–99)
Glucose-Capillary: 178 mg/dL — ABNORMAL HIGH (ref 70–99)
Glucose-Capillary: 143 mg/dL — ABNORMAL HIGH (ref 70–99)

## 2020-02-18 LAB — TRIGLYCERIDES: Triglycerides: 149 mg/dL (ref <150)

## 2020-02-18 MED ORDER — FOLIC ACID 1 MG PO TABS
1.0000 mg | ORAL_TABLET | Freq: Every day | ORAL | Status: DC
  Administered 2020-02-18 – 2020-02-26 (×9): 1 mg
  Filled 2020-02-18 (×8): qty 1

## 2020-02-18 MED ORDER — CLONAZEPAM 0.5 MG PO TABS: 0.5000 mg | ORAL_TABLET | Freq: Two times a day (BID) | ORAL | Status: DC

## 2020-02-18 MED ORDER — CLONAZEPAM 0.5 MG PO TABS
0.5000 mg | ORAL_TABLET | Freq: Two times a day (BID) | ORAL | Status: DC
  Administered 2020-02-18 – 2020-02-26 (×16): 0.5 mg
  Filled 2020-02-18 (×17): qty 1

## 2020-02-18 NOTE — Progress Notes (Signed)
   02/13/20 0947  Clinical Encounter Type  Visited With Patient and family together;Health care provider  Visit Type Initial  Referral From Nurse  Consult/Referral To Chaplain  Spiritual Encounters  Spiritual Needs Emotional;Prayer  Patient will be extubated. Daughter [name]) and Mother-in-Law Lelan Pons) are at bedside. Chaplain provided social, emotional, and mental support. Daughter is having a hard time with the decisions she needs to make. Patient may not make it so chaplain stayed to offer spiritual care. Chaplain offered prayer and spoke privately with both daughter and mother-in.

## 2020-02-18 NOTE — Progress Notes (Signed)
   02/18/20 1700  Clinical Encounter Type  Visited With Patient  Visit Type Follow-up  Consult/Referral To Chaplain  Spiritual Encounters  Spiritual Needs Prayer  The chaplain stopped by to check on the patient. The patient thanks the chaplain for continued prayers. Chaplain will continue to follow up with the patient. Chaplain will try to see patient's daughter tomorrow if needed.

## 2020-02-18 NOTE — Progress Notes (Signed)
NAME:  [name] [name], MRN:  [id number], DOB:  Aug 07, 1955, LOS: 38 ADMISSION DATE:  01/26/2020, CONSULTATION DATE:  01/28/20 REFERRING MD:  Ortiz/ Triad, CHIEF COMPLAINT:  resp distress    Brief History   64 yo female smoker presented 9/05 with Rt foot pain and swelling.  This was associated with chills, myalgias from sepsis with cellulitis.  Developed A fib with RV and then hypoxia.  Required intubation 9/07 and PCCM consulted.  H&P   [name] [name] is a 64 y.o. 64 year old female with past medical history patient at home, hypertension, type 2 diabetes,, tobacco abuse, questionable, and chronic substance abuse who presented to the emergency department.  Symptoms began after hours to 2 weeks prior to and imaging.  Patient also reported fever, chills, body aches, and mild headache with general malaise prior to admission.  Patient attempted soaking foot with little to no improvement prior to admission.  She denied any chest pain, shortness of breath,.  Initially on arrival patient was treated for sepsis and required mechanical intubation.  PCCM was consulted morning of 9/07 originally.  Extubation was attempted 9/17 patient became stridorous progressed with strokes requiring ventilation.  Patient was transferred to [facility], 9/19 for further evaluation of possible tracheostomy placement and continued treatment for sepsis.  Past Medical History  COPD on home oxygen, HTN, DM, Bipolar  Significant Hospital Events   9/05 Admit 9/07 intubated 9/08 back in sinus rhythm 9/14 fever, Abx changed 9/16 debridement of Rt 2nd toe with evacuation of abscess, superficial debridement of Rt lung with punch biopsy x 2 9/17 Extubate, required reintubation due to stridor distress 9/19 transferred to [facility] for further evaluation and possible tracheostomy  Consults:  Cardiology Surgery Palliative care  Procedures:  ETT 9/07 >> 9/17 9/17 >  Lt PICC 9/12 >>   Significant Diagnostic Tests:  Echo 9/08 >>  EF 55 to 60% CT Rt leg 9/11 >> diffuse subcutaneous soft tissue swelling, moderate myofasciitis involving mid to lower calf, no osteomyelitis or septic arthritis CRX 9/17 > Right lower lobe collapse and right pleural effusion unchanged. Progressive left lower lobe atelectasis and effusion. Bronch 9/20 > clear secretions, no obstruction or collapse POCUS bedside chest [place] 9/20 > minimal fluid  Micro Data:  COVID 9/05 >> negative MRSA PCR 9/08 >> negative Blood 9/05 >> Group B Streptococcus Blood 9/07 >> negative BAL 9/20 >   Antimicrobials:  Vancomycin 9/05 Rocephin 9/05 Ancef 9/06 >> 9/10 Vancomycin 9/11 >> 9/15 Clindamycin 9/14 >>  Ancef 9/15 >>   Interim history/subjective:  No events. Awaiting surgery to visit with patient. She remains mildly anxious but stable on fentanyl, ativan, and precedex drips.  Objective   Blood pressure (!) 143/69, pulse 81, temperature 98.9 F (37.2 C), temperature source Oral, resp. rate (!) 27, height _0  (1.651 m), weight 86.2 kg, SpO2 96 %.        Intake/Output Summary (Last 24 hours) at 02/18/2020 0803 Last data filed at 02/18/2020 0700 Gross per 24 hour  Intake 1001.86 ml  Output 1245 ml  Net -243.14 ml   Filed Weights   02/17/20 0500 02/17/20 0644 02/18/20 0405  Weight: 88.6 kg 85.9 kg 86.2 kg    Examination: Constitutional: chronically ill woman in no acute distress Eyes: eyes are anicteric, reactive to light Ears, nose, mouth, and throat: mucous membranes dry, trachea midline Cardiovascular: heart sounds are regular, ext are warm to touch. trace edema Respiratory: still wheezing, no accessory muscle use Gastrointestinal: abdomen is soft with + BS Skin:  No rashes, normal turgor, RLE wrapped Neurologic: Moves all 4 ext to command Psychiatric: stable mild anxiety  Renal function stable CBC benign  Assessment & Plan:   Acute on chronic hypoxic/hypercapnic respiratory failure - Secondary to acute pulmonary edema in setting  of A fib with RVR, sepsis resuscitation, COPD exacerbation, and mucus plugging.  S/p multiple therapeutic bronchoscopies. Acinetobacter in culture. - Continue to push diuresis as renal function allows - Meropenem x 7 days - See next issue  Group B Streptococcal bacteremia - Secondary to Rt lower leg cellulitis, ulcer, abscess s/p debridement on 9/16. 01/29/2020: Surface echo negative normal EF no veg. - Continue antimicrobials, broadened to meropenem. - Once leg is off I think we can give 24h abx then DC as source control will have been achieved  Transient A fib with RVR. - Has not recurred - Okay to stop heparin gtt as this was likely reactive  Goals of care. - Palliative care following - Patient's functional status at baseline she does not leave the home.  Sits and watches TV most of the day, noncompliant with home O2.  Severe COPD. - Daughter is having hard time coming to grips with her mother's mortality - Plan is surgery and if she has trouble with weaning from vent we will do tracheostomy  History of dysphagia. -Not protecting airway well, SLP consult but doubt safe to take PO  Underlying anxiety, air hunger, weakness, secretion clearance problems - See discussion 9/26 regarding path forward, remains DNR, continue drips for comfort as ordered but not full comfort care  Best practice:  Diet: sips okay DVT prophylaxis: heparin ppx GI prophylaxis: protonix Mobility: bed rest Code Status: DNR Disposition: ICU due to titration of various drips, re-affirming family wishes, and intermittent PRN BIPAP use Family updates: Daughter updated daily at length  This patient is critically ill with multiple organ system failure; which, requires frequent high complexity decision making, assessment, support, evaluation, and titration of therapies. This was completed through the application of advanced monitoring technologies and extensive interpretation of multiple databases. During this  encounter critical care time was devoted to patient care services described in this note for 35 minutes.  [name] Furbish, MD [facility] 02/18/2020 8:03 AM

## 2020-02-18 NOTE — Progress Notes (Signed)
Patient ID: [name] [name], female   DOB: 1956/04/15, 64 y.o.   MRN: [id number] Patient seen in follow-up for necrotic skin on the medial lateral aspect of the right leg with purulent drainage from the medial and lateral leg wounds.  Examination at this time the skin is improving the necrotic skin layer is showing epithelialization around the wound edges.  The 2 draining ulcers were debrided back to healthy viable granulation tissue there was a small amount of purulence expressed but essentially resolution of the abscess both medially and laterally there is good healthy granulation tissue.  She has a strong pulse the ulcers over the lesser toes have healed she does have a blister over the dorsum of the second toe.  Fourth toe has completely healed.  I reviewed the findings with her daughter and will cancel the amputation for tomorrow, patient appears to be healing.  And should continue to resolve with nutritional support.  Patient may begin physical therapy weightbearing as tolerated on the right.  I will follow-up in the office after discharge.

## 2020-02-18 NOTE — Plan of Care (Signed)
  Problem: Education: Goal: Knowledge of General Education information will improve Description: Including pain rating scale, medication(s)/side effects and non-pharmacologic comfort measures Outcome: Progressing   Problem: Clinical Measurements: Goal: Ability to maintain clinical measurements within normal limits will improve Outcome: Progressing Goal: Cardiovascular complication will be avoided Outcome: Progressing   Problem: Activity: Goal: Risk for activity intolerance will decrease Outcome: Progressing   Problem: Nutrition: Goal: Adequate nutrition will be maintained Outcome: Progressing   Problem: Coping: Goal: Level of anxiety will decrease Outcome: Progressing   Problem: Elimination: Goal: Will not experience complications related to urinary retention Outcome: Progressing   Problem: Pain Managment: Goal: General experience of comfort will improve Outcome: Progressing   Problem: Safety: Goal: Ability to remain free from injury will improve Outcome: Progressing   Problem: Skin Integrity: Goal: Risk for impaired skin integrity will decrease Outcome: Progressing   Problem: Health Behavior/Discharge Planning: Goal: Ability to manage health-related needs will improve Outcome: Not Progressing   Problem: Clinical Measurements: Goal: Will remain free from infection Outcome: Not Progressing Goal: Diagnostic test results will improve Outcome: Not Progressing Goal: Respiratory complications will improve Outcome: Not Progressing   Problem: Elimination: Goal: Will not experience complications related to bowel motility Outcome: Not Progressing

## 2020-02-18 NOTE — Progress Notes (Signed)
Reviewed Dr. Jess Barters note: no plans to amputate, will work on weaning sedation drips. If we can get her off precedex can work toward getting her out of unit.  Erskine Emery MD PCCM

## 2020-02-18 NOTE — Progress Notes (Deleted)
   02/13/20 0947  Clinical Encounter Type  Visited With Patient and family together;Health care provider  Visit Type Initial  Referral From Nurse  Consult/Referral To Chaplain  Spiritual Encounters  Spiritual Needs Emotional;Prayer  Patient will be extubated. Daughter [name]) and Mother-in-Law Lelan Pons) are at bedside. Chaplain provided social, emotional, and mental support. Daughter is having a hard time with the decisions she needs to make. Patient may not make it so chaplain stayed to offer spiritual care. Chaplain offered prayer and spoke privately with both daughter and mother-in.

## 2020-02-18 NOTE — Progress Notes (Signed)
Wasted 150 Fentanyl and 75 of Ativan with [employer], Therapist, sports.  Eden Emms, RN

## 2020-02-19 ENCOUNTER — Encounter (HOSPITAL_COMMUNITY)
Admission: EM | Disposition: A | Discharge status: Home Health | Payer: Medicaid Other | Source: Home / Self Care | Attending: Internal Medicine

## 2020-02-19 DIAGNOSIS — Z515 Encounter for palliative care: Secondary | ICD-10-CM | POA: Diagnosis not present

## 2020-02-19 DIAGNOSIS — R1310 Dysphagia, unspecified: Secondary | ICD-10-CM

## 2020-02-19 DIAGNOSIS — Z978 Presence of other specified devices: Secondary | ICD-10-CM | POA: Diagnosis not present

## 2020-02-19 DIAGNOSIS — R627 Adult failure to thrive: Secondary | ICD-10-CM | POA: Diagnosis not present

## 2020-02-19 LAB — CBC WITH DIFFERENTIAL/PLATELET
Abs Immature Granulocytes: 0.04 10*3/uL (ref 0.00–0.07)
Basophils Absolute: 0.1 10*3/uL (ref 0.0–0.1)
Basophils Relative: 1 %
Eosinophils Absolute: 0.2 10*3/uL (ref 0.0–0.5)
Eosinophils Relative: 2 %
HCT: 50.7 % — ABNORMAL HIGH (ref 36.0–46.0)
Hemoglobin: 14.8 g/dL (ref 12.0–15.0)
Immature Granulocytes: 1 %
Lymphocytes Relative: 20 %
Lymphs Abs: 1.8 10*3/uL (ref 0.7–4.0)
MCH: 27.9 pg (ref 26.0–34.0)
MCHC: 29.2 g/dL — ABNORMAL LOW (ref 30.0–36.0)
MCV: 95.5 fL (ref 80.0–100.0)
Monocytes Absolute: 0.9 10*3/uL (ref 0.1–1.0)
Monocytes Relative: 10 %
Neutro Abs: 5.7 10*3/uL (ref 1.7–7.7)
Neutrophils Relative %: 66 %
Platelets: 212 10*3/uL (ref 150–400)
RBC: 5.31 MIL/uL — ABNORMAL HIGH (ref 3.87–5.11)
RDW: 14.5 % (ref 11.5–15.5)
WBC: 8.7 10*3/uL (ref 4.0–10.5)
nRBC: 0 % (ref 0.0–0.2)

## 2020-02-19 LAB — BASIC METABOLIC PANEL
Anion gap: 11 (ref 5–15)
BUN: 26 mg/dL — ABNORMAL HIGH (ref 8–23)
CO2: 36 mmol/L — ABNORMAL HIGH (ref 22–32)
Calcium: 9.2 mg/dL (ref 8.9–10.3)
Chloride: 98 mmol/L (ref 98–111)
Creatinine, Ser: 0.32 mg/dL — ABNORMAL LOW (ref 0.44–1.00)
GFR calc Af Amer: >60 mL/min (ref >60)
GFR calc non Af Amer: >60 mL/min (ref >60)
Glucose, Bld: 150 mg/dL — ABNORMAL HIGH (ref 70–99)
Potassium: 4.7 mmol/L (ref 3.5–5.1)
Sodium: 145 mmol/L (ref 135–145)

## 2020-02-19 LAB — MAGNESIUM: Magnesium: 1.9 mg/dL (ref 1.7–2.4)

## 2020-02-19 LAB — GLUCOSE, CAPILLARY
Glucose-Capillary: 101 mg/dL — ABNORMAL HIGH (ref 70–99)
Glucose-Capillary: 134 mg/dL — ABNORMAL HIGH (ref 70–99)
Glucose-Capillary: 157 mg/dL — ABNORMAL HIGH (ref 70–99)
Glucose-Capillary: 159 mg/dL — ABNORMAL HIGH (ref 70–99)
Glucose-Capillary: 166 mg/dL — ABNORMAL HIGH (ref 70–99)
Glucose-Capillary: 221 mg/dL — ABNORMAL HIGH (ref 70–99)

## 2020-02-19 SURGERY — AMPUTATION, ABOVE KNEE
Anesthesia: Choice | Site: Knee | Laterality: Right

## 2020-02-19 MED ORDER — VALPROIC ACID 250 MG/5ML PO SOLN
500.0000 mg | Freq: Two times a day (BID) | ORAL | Status: DC
  Administered 2020-02-19 – 2020-02-26 (×16): 500 mg
  Filled 2020-02-19 (×17): qty 10

## 2020-02-19 MED ORDER — MOMETASONE FUROATE 0.1 % EX SOLN: Freq: Every day | CUTANEOUS | Status: DC

## 2020-02-19 NOTE — Evaluation (Addendum)
Physical Therapy Evaluation Patient Details Name: [name] [name] MRN: [id number] DOB: [birth year] Today's Date: 02/19/2020   History of Present Illness  64 yo admitted 6/5 with RLE wound, sepsis due to cellulitis. Pt with Afib with RVR and required intubation (9/7-9/17, 9/17-9/23). Transfer to [facility] 9/19 for possible trach and potential amputation. As of 9/28 amputation currently being avoided. PMHx: smoker, COPD on home O2, bipolar, DM, HTN  Clinical Impression  Pt lethargic on arrival with difficulty maintaining eyes open and answering questions. Pt not oriented to time and providing false home setup per daughter. Pt with very limited functional mobility due to cognition this session with inability to transfer out of bed without 2 person assist. Pt with decreased cognition, mobility, balance, and safety who will benefit from acute therapy to maximize mobility and independence. Pt lives alone and daughter works with daughter requesting SNF in [place] for proximity.   Pt on 2L throughout with SpO2 92-99% HR 87 BP 147/81 after mobility    Follow Up Recommendations SNF;Supervision/Assistance - 24 hour    Equipment Recommendations  3in1 (PT)    Recommendations for Other Services OT consult     Precautions / Restrictions Precautions Precautions: Fall Precaution Comments: cortrak, RLE wounds Restrictions Weight Bearing Restrictions: No RLE Weight Bearing: Weight bearing as tolerated      Mobility  Bed Mobility Overal bed mobility: Needs Assistance Bed Mobility: Supine to Sit;Sit to Supine     Supine to sit: Mod assist Sit to supine: Mod assist   General bed mobility comments: physical assist to bring legs off of bed and elevate trunk and assist to bring legs back to surface with return. Min assist to scoot toward [facility] with increased time and cues  Transfers Overall transfer level: Needs assistance   Transfers: Sit to/from Stand Sit to Stand: Mod assist          General transfer comment: physical assist with knees blocked and use of pad to achieve partial stand from elevated EOB x 2 trials with pt unable to fully rise. Scoot toward HOB x 3 at EOB with mod assist  Ambulation/Gait             General Gait Details: unable this date  Stairs            Wheelchair Mobility    Modified Rankin (Stroke Patients Only)       Balance Overall balance assessment: Needs assistance Sitting-balance support: Bilateral upper extremity supported;Feet supported Sitting balance-Leahy Scale: Poor Sitting balance - Comments: pt with min-mod assist for balance with tendencey for posterior right lean                                     Pertinent Vitals/Pain Pain Assessment: Faces Faces Pain Scale: Hurts even more Pain Location: grimace, groaning with weight bearing on RLE Pain Descriptors / Indicators: Discomfort;Grimacing Pain Intervention(s): Limited activity within patient's tolerance;Monitored during session;Repositioned    Home Living                        Prior Function                 Hand Dominance        Extremity/Trunk Assessment   Upper Extremity Assessment Upper Extremity Assessment: Generalized weakness    Lower Extremity Assessment Lower Extremity Assessment: Generalized weakness    Cervical / Trunk Assessment Cervical / Trunk Assessment:  Kyphotic  Communication      Cognition Arousal/Alertness: Lethargic Behavior During Therapy: Flat affect Overall Cognitive Status: Impaired/Different from baseline Area of Impairment: Orientation;Memory;Following commands;Safety/judgement;Attention                 Orientation Level: Disoriented to;Time;Situation Current Attention Level: Focused Memory: Decreased short-term memory Following Commands: Follows one step commands inconsistently;Follows one step commands with increased time Safety/Judgement: Decreased awareness of  safety;Decreased awareness of deficits            General Comments      Exercises     Assessment/Plan    PT Assessment Patient needs continued PT services  PT Problem List Decreased strength;Decreased mobility;Decreased safety awareness;Decreased knowledge of precautions;Decreased coordination;Decreased activity tolerance;Decreased cognition;Decreased balance;Decreased knowledge of use of DME;Pain       PT Treatment Interventions Gait training;Balance training;Functional mobility training;Cognitive remediation;Therapeutic activities;Patient/family education;DME instruction;Therapeutic exercise    PT Goals (Current goals can be found in the Care Plan section)  Acute Rehab PT Goals Patient Stated Goal: return home and be able to walk PT Goal Formulation: With family Time For Goal Achievement: 03/04/20 Potential to Achieve Goals: Fair    Frequency Min 3X/week   Barriers to discharge Decreased caregiver support      Co-evaluation               AM-PAC PT "6 Clicks" Mobility  Outcome Measure Help needed turning from your back to your side while in a flat bed without using bedrails?: A Lot Help needed moving from lying on your back to sitting on the side of a flat bed without using bedrails?: A Lot Help needed moving to and from a bed to a chair (including a wheelchair)?: Total Help needed standing up from a chair using your arms (e.g., wheelchair or bedside chair)?: Total Help needed to walk in hospital room?: Total Help needed climbing 3-5 steps with a railing? : Total 6 Click Score: 8    End of Session Equipment Utilized During Treatment: Gait belt Activity Tolerance: Patient limited by lethargy Patient left: in bed;with call bell/phone within reach;with bed alarm set;with family/visitor present;with nursing/sitter in room Nurse Communication: Mobility status;Need for lift equipment PT Visit Diagnosis: Other abnormalities of gait and mobility (R26.89);Difficulty  in walking, not elsewhere classified (R26.2);Muscle weakness (generalized) (M62.81)    Time: 3818-4037 PT Time Calculation (min) (ACUTE ONLY): 32 min   Charges:   PT Evaluation $PT Eval Moderate Complexity: 1 Mod          [name] [name], PT Acute Rehabilitation Services Pager: [phone] Office: [phone]   [name] [name] 02/19/2020, 12:00 PM

## 2020-02-19 NOTE — Progress Notes (Signed)
NAME:  [name] [name], MRN:  [id number], DOB:  05/10/1956, LOS: 24 ADMISSION DATE:  01/26/2020, CONSULTATION DATE:  01/28/20 REFERRING MD:  Ortiz/ Triad, CHIEF COMPLAINT:  resp distress    Brief History   64 yo female smoker presented 9/05 with Rt foot pain and swelling.  This was associated with chills, myalgias from sepsis with cellulitis.  Developed A fib with RV and then hypoxia.  Required intubation 9/07 and PCCM consulted.  H&P   [name] [name] is a 64 y.o. 64 year old female with past medical history patient at home, hypertension, type 2 diabetes,, tobacco abuse, questionable, and chronic substance abuse who presented to the emergency department.  Symptoms began after hours to 2 weeks prior to and imaging.  Patient also reported fever, chills, body aches, and mild headache with general malaise prior to admission.  Patient attempted soaking foot with little to no improvement prior to admission.  She denied any chest pain, shortness of breath,.  Initially on arrival patient was treated for sepsis and required mechanical intubation.  PCCM was consulted morning of 9/07 originally.  Extubation was attempted 9/17 patient became stridorous progressed with strokes requiring ventilation.  Patient was transferred to [facility], 9/19 for further evaluation of possible tracheostomy placement and continued treatment for sepsis.  Past Medical History  COPD on home oxygen, HTN, DM, Bipolar  Significant Hospital Events   9/05 Admit 9/07 intubated 9/08 back in sinus rhythm 9/14 fever, Abx changed 9/16 debridement of Rt 2nd toe with evacuation of abscess, superficial debridement of Rt lung with punch biopsy x 2 9/17 Extubate, required reintubation due to stridor distress 9/19 transferred to [facility] for further evaluation and possible tracheostomy  Consults:  Cardiology Surgery Palliative care  Procedures:  ETT 9/07 >> 9/17  Lt midline 9/12 >>   Significant Diagnostic Tests:  Echo 9/08 >> EF  55 to 60% CT Rt leg 9/11 >> diffuse subcutaneous soft tissue swelling, moderate myofasciitis involving mid to lower calf, no osteomyelitis or septic arthritis CRX 9/17 > Right lower lobe collapse and right pleural effusion unchanged. Progressive left lower lobe atelectasis and effusion. Bronch 9/20 > clear secretions, no obstruction or collapse POCUS bedside chest [place] 9/20 > minimal fluid  Micro Data:  COVID 9/05 >> negative MRSA PCR 9/08 >> negative Blood 9/05 >> Group B Streptococcus Blood 9/07 >> negative BAL 9/20 > acinetobacter  Antimicrobials:  Vancomycin 9/05 Rocephin 9/05 Ancef 9/06 >> 9/10 Vancomycin 9/11 >> 9/15 Clindamycin 9/14 >> 9/18 Ancef 9/15 >> 9/21 Merrem 9/22->  Interim history/subjective:  9/29: off all continuous infusions, transfer from ICU. Will see consult ID pharmacy to see if we can get off merrem in favor of unasyn to cover leg and acinetobacter or monitor off abx esp since on keppra.   Update: d/w family revealed that pt is on depakote 2/2 bipolar rather than sz. She has no h/o sz. Will d/c keppra as could be contributing to her hypoactive delirium/encephalopathy. Will resume home depakote for bipolar at this time.   Objective   Blood pressure (!) 141/78, pulse 84, temperature 98 F (36.7 C), temperature source Oral, resp. rate (!) 26, height _0  (1.651 m), weight 86.3 kg, SpO2 97 %.        Intake/Output Summary (Last 24 hours) at 02/19/2020 0837 Last data filed at 02/19/2020 0600 Gross per 24 hour  Intake 821.7 ml  Output 880 ml  Net -58.3 ml   Filed Weights   02/17/20 0644 02/18/20 0405 02/19/20 0500  Weight: 85.9 kg  86.2 kg 86.3 kg    Examination: Constitutional: chronically ill woman in no acute distress, sitting up in bed on home 2L Eyes: eyes are anicteric, reactive to light, EOMI Ears, nose, mouth, and throat: mucous membranes dry, trachea midline Cardiovascular: heart sounds are regular, ext are warm to touch. trace  edema Respiratory: no wheezing, no accessory muscle use on 2L (home dose) Gastrointestinal: abdomen is soft with + BS Skin: No rashes, normal turgor, RLE wrapped Neurologic: Moves all 4 ext to command  Lab Results  Component Value Date   WBC 5.6 02/18/2020   HGB 13.8 02/18/2020   HCT 45.0 02/18/2020   MCV 91.6 02/18/2020   PLT 196 02/18/2020   Lab Results  Component Value Date   CREATININE 0.41 (L) 02/17/2020   BUN 41 (H) 02/17/2020   NA 138 02/17/2020   K 4.1 02/17/2020   CL 92 (L) 02/17/2020   CO2 36 (H) 02/17/2020    Assessment & Plan:   Acute on chronic hypoxic/hypercapnic respiratory failure - Secondary to acute pulmonary edema in setting of A fib with RVR, sepsis resuscitation, COPD exacerbation, and mucus plugging.  S/p multiple therapeutic bronchoscopies. Acinetobacter in culture. - Continue to push diuresis as renal function allows - Meropenem x 7 days completed.  (not best choice if h/o sz, will clarify) - See next issue -back on home 2L  Group B Streptococcal bacteremia - Secondary to Rt lower leg cellulitis, ulcer, abscess s/p debridement on 9/16. 01/29/2020: Surface echo negative normal EF no veg. - Continue antimicrobials, broadened to meropenem, but could likely de-escalate -no surgery planned. Per Dr duda's note pt wound is healing. She has been afebrile and without wbc, suspect we will be able to stop abx and closely monitor off.  -completed course for this.  LE wound -appreciate Dr Sharol Given -per note, no indication for amputation at this time, wound healing -afebrile/no leukocytosis. Will stop abx and monitor.   Transient A fib with RVR. - Has not recurred -hold systemic a/c  Goals of care. - Palliative care following - Patient's functional status at baseline she does not leave the home.  Sits and watches TV most of the day, noncompliant with home O2.  Severe COPD. - Daughter is having hard time coming to grips with her mother's mortality   History of  dysphagia. -cortrak  Underlying anxiety, air hunger, weakness, secretion clearance problems - See discussion 9/26 regarding path forward, remains DNR,   Best practice:  Diet:tube feeds DVT prophylaxis: lovenox sq GI prophylaxis: protonix Mobility: bed rest Code Status: DNR Disposition: floor, will ask TRH to assume care and CCM will sign off at this time.  Family updates: Daughter updated daily at length  Care time 22mn.   JAudria Nine MD [facility] 02/19/2020 8:37 AM

## 2020-02-19 NOTE — Progress Notes (Signed)
Notified MD Ruthann Cancer via secure chat that bladder scan was 375 mL. Awaiting response.

## 2020-02-19 NOTE — Progress Notes (Signed)
Pt transferred from [facility]. Pt axox2 (not to time or situation), on 2L O2 via [place] (dependent), and has tube feedings @60mL . Dressing and p[revalon boot on R leg. Pt denies any pain at the moment. Bed in lowest position, bed alarm on and call light within reach.

## 2020-02-19 NOTE — Progress Notes (Signed)
  Speech Language Pathology Treatment: Dysphagia  Patient Details Name: [name] [name] MRN: [id number] DOB: Mar 28, 1956 Today's Date: 02/19/2020 Time: 2563-8937 SLP Time Calculation (min) (ACUTE ONLY): 26 min  Assessment / Plan / Recommendation Clinical Impression  Pt was seen for dysphagia treatment. Pt's nurse, [place], reported that the pt has been more congested today and has been demonstrating coughing with ice chips. Pt exhibited extensive weak coughing at baseline and when she was placed in supine position for repositioning. Aspiration of secretions is likely. Pt had recently received oral care so it was deferred during this session. Pt consistently demonstrated leaning the right and this appeared to negatively impact swallow function. Pt tolerated puree solids without overt s/sx of aspiration; however, an oral/pharyngeal delay is suspected due to >30seconds elapsing prior to any hyolaryngeal movement. Pt's bolus awareness was reduced and cueing was intermittently needed for bolus manipulation. Extensive coughing was noted with ice chips and thin liquids via tsp and desaturation to 89% was observed. With physical support, pt was able to maintain a midline position and tolerated individual boluses of small ice chips without overt s/sx of aspiration. It is recommended that the pt's NPO status be maintained and ice chips be limited considering the severity of coughing; pt may have small, individual ice chips following thorough oral care. Additional boluses should be deferred if extensive coughing and/or increased respiratory difficulty is demonstrated. SLP will continue to follow pt.    HPI HPI: 64yo female admitted 01/26/20 with right foot pain and swelling, as well as chills, myalgias. Multiple intubations 9/7-17, 9/17-23. PMH: COPD HTN, DM2, tobacco abuse, chronic substance abuse, BiPolar, colon cancer, anxiety/depression      SLP Plan  Continue with current plan of care        Recommendations  Diet recommendations: NPO (with individual, small ice chips) Medication Administration: Via alternative means                Oral Care Recommendations: Oral care QID;Oral care prior to ice chip/H20;Staff/trained caregiver to provide oral care Follow up Recommendations: Other (comment) (TBD) SLP Visit Diagnosis: Dysphagia, unspecified (R13.10) Plan: Continue with current plan of care       Kobi Aller I. Hardin Negus, [place], [place] Office number [phone] Pager [place] 02/19/2020, 12:39 PM

## 2020-02-20 DIAGNOSIS — A419 Sepsis, unspecified organism: Secondary | ICD-10-CM | POA: Diagnosis not present

## 2020-02-20 DIAGNOSIS — J9601 Acute respiratory failure with hypoxia: Secondary | ICD-10-CM | POA: Diagnosis not present

## 2020-02-20 DIAGNOSIS — L03115 Cellulitis of right lower limb: Secondary | ICD-10-CM | POA: Diagnosis not present

## 2020-02-20 DIAGNOSIS — Z978 Presence of other specified devices: Secondary | ICD-10-CM | POA: Diagnosis not present

## 2020-02-20 LAB — BASIC METABOLIC PANEL
Anion gap: 5 (ref 5–15)
BUN: 22 mg/dL (ref 8–23)
CO2: 39 mmol/L — ABNORMAL HIGH (ref 22–32)
Calcium: 9.1 mg/dL (ref 8.9–10.3)
Chloride: 101 mmol/L (ref 98–111)
Creatinine, Ser: <0.3 mg/dL — ABNORMAL LOW (ref 0.44–1.00)
Glucose, Bld: 147 mg/dL — ABNORMAL HIGH (ref 70–99)
Potassium: 4 mmol/L (ref 3.5–5.1)
Sodium: 145 mmol/L (ref 135–145)

## 2020-02-20 LAB — CBC WITH DIFFERENTIAL/PLATELET
Abs Immature Granulocytes: 0.02 10*3/uL (ref 0.00–0.07)
Basophils Absolute: 0.1 10*3/uL (ref 0.0–0.1)
Basophils Relative: 1 %
Eosinophils Absolute: 0.3 10*3/uL (ref 0.0–0.5)
Eosinophils Relative: 5 %
HCT: 49.1 % — ABNORMAL HIGH (ref 36.0–46.0)
Hemoglobin: 14.4 g/dL (ref 12.0–15.0)
Immature Granulocytes: 0 %
Lymphocytes Relative: 38 %
Lymphs Abs: 2.3 10*3/uL (ref 0.7–4.0)
MCH: 28 pg (ref 26.0–34.0)
MCHC: 29.3 g/dL — ABNORMAL LOW (ref 30.0–36.0)
MCV: 95.5 fL (ref 80.0–100.0)
Monocytes Absolute: 0.5 10*3/uL (ref 0.1–1.0)
Monocytes Relative: 8 %
Neutro Abs: 2.8 10*3/uL (ref 1.7–7.7)
Neutrophils Relative %: 48 %
Platelets: 193 10*3/uL (ref 150–400)
RBC: 5.14 MIL/uL — ABNORMAL HIGH (ref 3.87–5.11)
RDW: 14.2 % (ref 11.5–15.5)
WBC: 6 10*3/uL (ref 4.0–10.5)
nRBC: 0 % (ref 0.0–0.2)

## 2020-02-20 LAB — GLUCOSE, CAPILLARY
Glucose-Capillary: 149 mg/dL — ABNORMAL HIGH (ref 70–99)
Glucose-Capillary: 149 mg/dL — ABNORMAL HIGH (ref 70–99)
Glucose-Capillary: 166 mg/dL — ABNORMAL HIGH (ref 70–99)
Glucose-Capillary: 168 mg/dL — ABNORMAL HIGH (ref 70–99)
Glucose-Capillary: 178 mg/dL — ABNORMAL HIGH (ref 70–99)
Glucose-Capillary: 200 mg/dL — ABNORMAL HIGH (ref 70–99)

## 2020-02-20 LAB — MAGNESIUM: Magnesium: 1.9 mg/dL (ref 1.7–2.4)

## 2020-02-20 MED ORDER — ACETAMINOPHEN 650 MG RE SUPP: 650.0000 mg | Freq: Four times a day (QID) | RECTAL | Status: DC | PRN

## 2020-02-20 MED ORDER — ACETAMINOPHEN 160 MG/5ML PO SOLN
650.0000 mg | Freq: Four times a day (QID) | ORAL | Status: DC | PRN
  Administered 2020-02-20 – 2020-02-26 (×7): 650 mg
  Filled 2020-02-20 (×7): qty 20.3

## 2020-02-20 MED ORDER — QUETIAPINE FUMARATE 25 MG PO TABS
25.0000 mg | ORAL_TABLET | Freq: Every day | ORAL | Status: DC
  Administered 2020-02-21 – 2020-02-26 (×6): 25 mg
  Filled 2020-02-20 (×6): qty 1

## 2020-02-20 MED ORDER — DEXAMETHASONE SODIUM PHOSPHATE 4 MG/ML IJ SOLN
4.0000 mg | INTRAMUSCULAR | Status: DC
  Administered 2020-02-21: 4 mg via INTRAVENOUS
  Filled 2020-02-20: qty 1

## 2020-02-20 MED ORDER — CHLORHEXIDINE GLUCONATE CLOTH 2 % EX PADS
6.0000 | MEDICATED_PAD | Freq: Every day | CUTANEOUS | Status: DC
  Administered 2020-02-22: 6 via TOPICAL

## 2020-02-20 NOTE — Progress Notes (Signed)
PROGRESS NOTE    [name] [name]  ENI:[id number] DOB: 01-03-1956 DOA: 01/26/2020 PCP: Leonard Downing, MD  Brief Narrative: 64 year old female with history of heavy tobacco abuse, severe COPD/chronic respiratory failure on 4 L home O2, polysubstance abuse, type 2 diabetes mellitus, obesity, alcohol abuse, atrial fibrillation was admitted to [facility] on 9/5 with worsening pain swelling and drainage from her right foot, second toe and multiple ulcers on her lower leg along with fevers and chills. -She was found to have sepsis and group A strep bacteremia from cellulitis and mild fasciitis of right lower extremity, underwent debridement of epidermis and dermis of second toe on 9/16 by Dr. Constance Haw. [facility] course complicated by respiratory failure requiring mechanical ventilation on 9/7, she was extubated on 9/17 and then required reintubation due to stridor/respiratory distress, on 9/19 she was transferred to [facility], she was also followed by palliative care due to overall poor prognosis, eventually extubated on 9/23 -Transferred from PCCM to [facility] service today 9/30   Assessment & Plan:     Acute respiratory failure with hypoxia and hypercapnia ([place]) -Secondary to pulmonary edema, sepsis, A. fib RVR, COPD exacerbation and mucous plugging -Required prolonged mechanical ventilation from 9/7, extubated and reintubated on 9/17 and finally extubated on 9/23, now DNR -Treated with diuretics also underwent multiple therapeutic bronchoscopies -Treated with multiple antibiotic courses, most recently on IV meropenem, now completed -Appears euvolemic at this time, will use Lasix as needed -Also had stridor in ICU, on IV Decadron for this, no further stridor noted at this time, continue for 2 more days and then stop  Sepsis Group B strep bacteremia -Right lower extremity ulcer, cellulitis -Underwent debridement on 9/16 -2D echocardiogram was unremarkable on 9/8 -Orthopedics  consulted, amputation deferred at this time -Antibiotics discontinued yesterday in ICU, has been on multiple different antibiotics for 24 days now  Transient A. fib with RVR -No further recurrence, systemic anticoagulation on hold  Severe COPD/chronic respiratory failure -On 4 L home O2 at baseline -High risk of decline in decompensation  Dysphagia -Currently with core track, palliative following, anticipate some improvement in dysphagia to allow p.o. -SLP following, plan for MBS/F EES tomorrow  Anxiety, depression, mood disorder -Resumed Cogentin, Depakote -On low-dose Klonopin now -Change Seroquel to nightly  Type 2 diabetes mellitus -Last hemoglobin A1c was 5.8 -Continue sliding scale insulin  DVT prophylaxis: Lovenox Code Status: DNR Family Communication: No family at bedside Disposition Plan:  Status is: Inpatient  Remains inpatient appropriate because:Inpatient level of care appropriate due to severity of illness   Dispo: The patient is from: Home              Anticipated d/c is to: SNF              Anticipated d/c date is: > 3 days              Patient currently is not medically stable to d/c.  Consultants:   Palliative care, PCCM, general surgery, orthopedics Dr. Sharol Given   Procedures: Bedside debridement 9/16  Antimicrobials:    Subjective: -[place], wants to eat, no events overnight  Objective: Vitals:   02/19/20 2008 02/20/20 0023 02/20/20 0422 02/20/20 1036  BP: (!) 123/57 120/66 123/60 139/61  Pulse: 73 75 74 77  Resp: 18 (!) 25 (!) 24 18  Temp: 99 F (37.2 C) 98.5 F (36.9 C) 98.2 F (36.8 C) 98.6 F (37 C)  TempSrc: Oral Oral  Oral  SpO2: 96% 97% 97% 98%  Weight:  Height:        Intake/Output Summary (Last 24 hours) at 02/20/2020 1150 Last data filed at 02/20/2020 0900 Gross per 24 hour  Intake 2527 ml  Output 850 ml  Net 1677 ml   Filed Weights   02/18/20 0405 02/19/20 0500 02/19/20 1609  Weight: 86.2 kg 86.3 kg 88.4 kg     Examination:  General exam: Chronically ill obese female sitting up in bed, awake alert oriented to self and partly to place HEENT: No JVD, NG tube noted Respiratory system: Decreased breath sounds the bases  cardiovascular system: S1 & S2 heard, RRR.  Gastrointestinal system: Soft, nontender, bowel sounds present Central nervous system: Awake alert  extremities: Trace edema, multiple wounds noted over her right leg Skin: As above Psychiatry:  Mood & affect appropriate.     Data Reviewed:   CBC: Recent Labs  Lab 02/16/20 0249 02/17/20 0233 02/18/20 0309 02/19/20 0935 02/20/20 0513  WBC 6.8 6.4 5.6 8.7 6.0  NEUTROABS 3.2 3.2 3.0 5.7 2.8  HGB 13.6 14.6 13.8 14.8 14.4  HCT 44.0 48.1* 45.0 50.7* 49.1*  MCV 89.4 90.1 91.6 95.5 95.5  PLT 203 199 196 212 149   Basic Metabolic Panel: Recent Labs  Lab 02/15/20 0542 02/15/20 0542 02/16/20 0249 02/17/20 0233 02/17/20 0957 02/19/20 0935 02/20/20 0513  NA 133*  --  136 138  --  145 145  K 3.8   < > 3.2* 5.4* 4.1 4.7 4.0  CL 84*  --  87* 92*  --  98 101  CO2 35*  --  40* 36*  --  36* 39*  GLUCOSE 155*  --  133* 119*  --  150* 147*  BUN 39*  --  43* 41*  --  26* 22  CREATININE 0.42*  --  0.42* 0.41*  --  0.32* <0.30*  CALCIUM 9.5  --  9.7 9.6  --  9.2 9.1  MG 2.0  --  1.9 1.9  --  1.9 1.9   < > = values in this interval not displayed.   GFR: CrCl cannot be calculated (This lab value cannot be used to calculate CrCl because it is not a number: <0.30). Liver Function Tests: No results for input(s): AST, ALT, ALKPHOS, BILITOT, PROT, ALBUMIN in the last 168 hours. No results for input(s): LIPASE, AMYLASE in the last 168 hours. No results for input(s): AMMONIA in the last 168 hours. Coagulation Profile: No results for input(s): INR, PROTIME in the last 168 hours. Cardiac Enzymes: No results for input(s): CKTOTAL, CKMB, CKMBINDEX, TROPONINI in the last 168 hours. BNP (last 3 results) No results for input(s): PROBNP in  the last 8760 hours. HbA1C: No results for input(s): HGBA1C in the last 72 hours. CBG: Recent Labs  Lab 02/19/20 2010 02/20/20 0012 02/20/20 0422 02/20/20 0727 02/20/20 1133  GLUCAP 157* 200* 168* 149* 178*   Lipid Profile: Recent Labs    02/18/20 0309  TRIG 149   Thyroid Function Tests: No results for input(s): TSH, T4TOTAL, FREET4, T3FREE, THYROIDAB in the last 72 hours. Anemia Panel: No results for input(s): VITAMINB12, FOLATE, FERRITIN, TIBC, IRON, RETICCTPCT in the last 72 hours. Urine analysis:    Component Value Date/Time   COLORURINE YELLOW 02/01/2020 2040   APPEARANCEUR CLEAR 02/01/2020 2040   LABSPEC 1.031 (H) 02/01/2020 2040   PHURINE 5.0 02/01/2020 2040   GLUCOSEU NEGATIVE 02/01/2020 2040   HGBUR NEGATIVE 02/01/2020 2040   BILIRUBINUR NEGATIVE 02/01/2020 2040   KETONESUR NEGATIVE 02/01/2020 2040   PROTEINUR  NEGATIVE 02/01/2020 2040   UROBILINOGEN 1.0 10/31/2012 1157   NITRITE NEGATIVE 02/01/2020 2040   LEUKOCYTESUR NEGATIVE 02/01/2020 2040   Sepsis Labs: _0 (procalcitonin:4,lacticidven:4)  )No results found for this or any previous visit (from the past 240 hour(s)).    Scheduled Meds:  benztropine  0.5 mg Per Tube BID   chlorhexidine gluconate (MEDLINE KIT)  15 mL Mouth Rinse BID   [START ON 02/21/2020] Chlorhexidine Gluconate Cloth  6 each Topical Q0600   clonazePAM  0.5 mg Per Tube BID   cloNIDine  0.2 mg Per Tube TID   dexamethasone (DECADRON) injection  4 mg Intravenous Q24H   docusate  100 mg Per Tube BID   enoxaparin (LOVENOX) injection  40 mg Subcutaneous Q24H   feeding supplement (PROSource TF)  45 mL Per Tube TID   folic acid  1 mg Per Tube Daily   Gerhardt's butt cream   Topical BID   insulin aspart  0-9 Units Subcutaneous Q4H   mouth rinse  15 mL Mouth Rinse BID   mupirocin ointment   Nasal Daily   pantoprazole (PROTONIX) IV  40 mg Intravenous Q24H   polyethylene glycol  17 g Per Tube Daily   QUEtiapine  25 mg  Per Tube TID   thiamine  100 mg Per Tube Daily   Or   thiamine  100 mg Intravenous Daily   valproic acid  500 mg Per Tube BID   Continuous Infusions:  sodium chloride 40 mL/hr (02/15/20 0900)   feeding supplement (JEVITY 1.2 CAL) 60 mL/hr at 02/19/20 1543     LOS: 25 days    Time spent: 53mn  PDomenic Polite MD [facility]  02/20/2020, 11:50 AM

## 2020-02-20 NOTE — Progress Notes (Signed)
   02/20/20 0422  Vitals  Temp 98.2 F (36.8 C)  BP 123/60  MAP (mmHg) 80  BP Location Right Arm  BP Method Automatic  Patient Position (if appropriate) Lying  Pulse Rate 74  Pulse Rate Source Monitor  Resp (!) 24  Level of Consciousness  Level of Consciousness Alert  MEWS COLOR  MEWS Score Color Green  Oxygen Therapy  SpO2 97 %  Pain Assessment  Pain Scale PAINAD  Pain Location Head  PAINAD (Pain Assessment in Advanced Dementia)  Breathing 2  Negative Vocalization 1  Facial Expression 1  Body Language 1  Consolability 0  PAINAD Score 5  MEWS Score  MEWS Temp 0  MEWS Systolic 0  MEWS Pulse 0  MEWS RR 1  MEWS LOC 0  MEWS Score 1  Note  Observations Pt has had no urine output for this shift. Bladder scan: 85cc

## 2020-02-20 NOTE — Evaluation (Signed)
Occupational Therapy Evaluation Patient Details Name: [name] [name] MRN: [id number] DOB: 09-Jul-1955 Today's Date: 02/20/2020    History of Present Illness 64 yo admitted 6/5 with RLE wound, sepsis due to cellulitis. Pt with Afib with RVR and required intubation (9/7-9/17, 9/17-9/23). Transfer to [facility] 9/19 for possible trach and potential amputation. As of 9/28 amputation currently being avoided. PMHx: smoker, COPD on home O2, bipolar, DM, HTN   Clinical Impression   Patient admitted for above and limited by problem list below, including impaired cognition, decreased activity tolerance, generalized weakness and impaired balance. Pt on 2L O2 via [place], VSS during session, but limited by lethargy.  Requires cueing to maintain eyes open supine, improved sitting EOB.  Max assist required for bed mobility, max assist for washing face given hand over hand assist, mod-max assist for sitting balance statically at EOB, and total assist for remainder of ADLs at this time.  Patient oriented to self only, follows minimal simple 1 step commands with poor task attention, awareness or recall. Pt unable to provide home setup or PLOF, but per PT note patient independent PTA. Believe she will benefit from continued OT services while admitted and after dc at SNF level to optimize independence and safety with ADLs, mobility. Will follow.     Follow Up Recommendations  SNF;Supervision/Assistance - 24 hour    Equipment Recommendations  Other (comment) (TBD at next venue of care )    Recommendations for Other Services       Precautions / Restrictions Precautions Precautions: Fall Precaution Comments: cortrak, RLE wounds Restrictions Weight Bearing Restrictions: No RLE Weight Bearing: Weight bearing as tolerated      Mobility Bed Mobility Overal bed mobility: Needs Assistance Bed Mobility: Supine to Sit;Sit to Supine     Supine to sit: Max assist Sit to supine: Max assist   General bed mobility  comments: max assist to bring BLEs and trunk to/from sitting EOB, increased time and cueing   Transfers Overall transfer level: Needs assistance               General transfer comment: deferred due to safety and impaired balance     Balance Overall balance assessment: Needs assistance Sitting-balance support: Bilateral upper extremity supported;Feet supported Sitting balance-Leahy Scale: Poor Sitting balance - Comments: mod assist at best, max assist overall with R lateral and posterior lean; able to correct with multimodal cueing but poor ability to sustain  Postural control: Posterior lean;Right lateral lean                                 ADL either performed or assessed with clinical judgement   ADL Overall ADL's : Needs assistance/impaired Eating/Feeding: NPO   Grooming: Maximal assistance;Bed level Grooming Details (indicate cue type and reason): wash face only, minimal initation after HOH support but max assist to fully complete task                              Functional mobility during ADLs: Maximal assistance (limited to EOB ) General ADL Comments: total assist for all other self care at this time      Vision   Additional Comments: further assessment required, pt requires cueing to maintain eyes open      Perception     Praxis      Pertinent Vitals/Pain Pain Assessment: Faces Faces Pain Scale: No hurt  Hand Dominance Right   Extremity/Trunk Assessment Upper Extremity Assessment Upper Extremity Assessment: Generalized weakness   Lower Extremity Assessment Lower Extremity Assessment: Defer to PT evaluation   Cervical / Trunk Assessment Cervical / Trunk Assessment: Kyphotic   Communication Communication Communication: Expressive difficulties   Cognition Arousal/Alertness: Lethargic Behavior During Therapy: Flat affect Overall Cognitive Status: Impaired/Different from baseline Area of Impairment:  Orientation;Memory;Following commands;Safety/judgement;Attention;Awareness;Problem solving                 Orientation Level: Disoriented to;Time;Situation;Place Current Attention Level: Focused Memory: Decreased short-term memory Following Commands: Follows one step commands inconsistently;Follows one step commands with increased time Safety/Judgement: Decreased awareness of safety;Decreased awareness of deficits Awareness: Intellectual Problem Solving: Slow processing;Decreased initiation;Difficulty sequencing;Requires tactile cues;Requires verbal cues General Comments: pt lethargic, following minimal 1 step simple commands with poor attention to task; cueing to maintain eyes open during session    General Comments  VSS on 2L [place]     Exercises     Shoulder Instructions      Home Living Family/patient expects to be discharged to:: Private residence Living Arrangements: Alone Available Help at Discharge: Family;Available PRN/intermittently Type of Home: Apartment Home Access: Level entry     Home Layout: One level     Bathroom Shower/Tub: Teacher, early years/pre: Handicapped height     Home Equipment: None   Additional Comments: per PT report, pt unable to provide home setup      Prior Functioning/Environment Level of Independence: Independent        Comments: per PT report, pt unable to to provide PLOF         OT Problem List: Decreased strength;Decreased activity tolerance;Impaired balance (sitting and/or standing);Decreased coordination;Decreased cognition;Decreased safety awareness;Decreased knowledge of use of DME or AE;Decreased knowledge of precautions;Impaired UE functional use;Cardiopulmonary status limiting activity;Obesity      OT Treatment/Interventions: Self-care/ADL training;Energy conservation;DME and/or AE instruction;Therapeutic activities;Cognitive remediation/compensation;Patient/family education;Balance training;Therapeutic  exercise    OT Goals(Current goals can be found in the care plan section) Acute Rehab OT Goals Patient Stated Goal: none stated  OT Goal Formulation: Patient unable to participate in goal setting Time For Goal Achievement: 03/05/20 Potential to Achieve Goals: Fair  OT Frequency: Min 2X/week   Barriers to D/C:            Co-evaluation              AM-PAC OT "6 Clicks" Daily Activity     Outcome Measure Help from another person eating meals?: Total Help from another person taking care of personal grooming?: A Lot Help from another person toileting, which includes using toliet, bedpan, or urinal?: Total Help from another person bathing (including washing, rinsing, drying)?: Total Help from another person to put on and taking off regular upper body clothing?: Total Help from another person to put on and taking off regular lower body clothing?: Total 6 Click Score: 7   End of Session Equipment Utilized During Treatment: Oxygen Nurse Communication: Mobility status  Activity Tolerance: Patient limited by lethargy Patient left: in bed;with call bell/phone within reach;with bed alarm set;with SCD's reapplied  OT Visit Diagnosis: Other abnormalities of gait and mobility (R26.89);Muscle weakness (generalized) (M62.81);Other symptoms and signs involving cognitive function                Time: 3716-9678 OT Time Calculation (min): 23 min Charges:  OT General Charges $OT Visit: 1 Visit OT Evaluation $OT Eval Moderate Complexity: 1 Mod OT Treatments $Self Care/Home Management : 8-22 mins  Jolaine Artist, OT Acute Rehabilitation Services Pager [phone] Office [phone]   Delight Stare 02/20/2020, 1:07 PM

## 2020-02-20 NOTE — Progress Notes (Signed)
  Speech Language Pathology Treatment: Dysphagia  Patient Details Name: [name] [name] MRN: [id number] DOB: 11/27/1955 Today's Date: 02/20/2020 Time: 2023-3435 SLP Time Calculation (min) (ACUTE ONLY): 18 min  Assessment / Plan / Recommendation Clinical Impression  Pt was seen for dysphagia treatment. She was alert and cooperative during the session and her oxygen needs have improved to 2L via nasal cannula. 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 Pt tolerated puree solids and honey thick liquids via cup and straw without overt s/sx of aspiration. Coughing was consistently noted with multiple ice chips and thin liquids via cup. Pt inconsistently exhibited throat clearing and coughing with with nectar thick liquids via cup and straw. Pt's swallow function appears improved compared on 9/29. An instrumental assessment is recommended for 10/1 to assess swallowing physiology and determine safety of p.o. intake.    HPI HPI: 64yo female admitted 01/26/20 with right foot pain and swelling, as well as chills, myalgias. Multiple intubations 9/7-17, 9/17-23. PMH: COPD HTN, DM2, tobacco abuse, chronic substance abuse, BiPolar, colon cancer, anxiety/depression      SLP Plan  Other (Comment) (Instrumental assessment; MBS/FEES)       Recommendations  Diet recommendations: NPO (pt may have individual small ice chips) Medication Administration: Via alternative means                Oral Care Recommendations: Oral care QID;Oral care prior to ice chip/H20;Staff/trained caregiver to provide oral care Follow up Recommendations: Other (comment) (TBD) SLP Visit Diagnosis: Dysphagia, unspecified (R13.10) Plan: Other (Comment) (Instrumental assessment; MBS/FEES)       Tobie Poet I. Hardin Negus, [place], [place] Office number [phone] Pager [place] 02/20/2020, 10:05 AM

## 2020-02-21 DIAGNOSIS — J9601 Acute respiratory failure with hypoxia: Secondary | ICD-10-CM | POA: Diagnosis not present

## 2020-02-21 DIAGNOSIS — A419 Sepsis, unspecified organism: Secondary | ICD-10-CM | POA: Diagnosis not present

## 2020-02-21 DIAGNOSIS — Z978 Presence of other specified devices: Secondary | ICD-10-CM | POA: Diagnosis not present

## 2020-02-21 DIAGNOSIS — L03115 Cellulitis of right lower limb: Secondary | ICD-10-CM | POA: Diagnosis not present

## 2020-02-21 DIAGNOSIS — R1310 Dysphagia, unspecified: Secondary | ICD-10-CM

## 2020-02-21 LAB — CBC WITH DIFFERENTIAL/PLATELET
Abs Immature Granulocytes: 0.03 10*3/uL (ref 0.00–0.07)
Basophils Absolute: 0.1 10*3/uL (ref 0.0–0.1)
Basophils Relative: 1 %
Eosinophils Absolute: 0.3 10*3/uL (ref 0.0–0.5)
Eosinophils Relative: 5 %
HCT: 47.8 % — ABNORMAL HIGH (ref 36.0–46.0)
Hemoglobin: 14.1 g/dL (ref 12.0–15.0)
Immature Granulocytes: 1 %
Lymphocytes Relative: 37 %
Lymphs Abs: 2.3 10*3/uL (ref 0.7–4.0)
MCH: 27.3 pg (ref 26.0–34.0)
MCHC: 29.5 g/dL — ABNORMAL LOW (ref 30.0–36.0)
MCV: 92.6 fL (ref 80.0–100.0)
Monocytes Absolute: 0.4 10*3/uL (ref 0.1–1.0)
Monocytes Relative: 6 %
Neutro Abs: 3.2 10*3/uL (ref 1.7–7.7)
Neutrophils Relative %: 50 %
Platelets: 199 10*3/uL (ref 150–400)
RBC: 5.16 MIL/uL — ABNORMAL HIGH (ref 3.87–5.11)
RDW: 13.9 % (ref 11.5–15.5)
WBC: 6.3 10*3/uL (ref 4.0–10.5)
nRBC: 0 % (ref 0.0–0.2)

## 2020-02-21 LAB — BASIC METABOLIC PANEL
Anion gap: 9 (ref 5–15)
BUN: 15 mg/dL (ref 8–23)
CO2: 37 mmol/L — ABNORMAL HIGH (ref 22–32)
Calcium: 9.2 mg/dL (ref 8.9–10.3)
Chloride: 95 mmol/L — ABNORMAL LOW (ref 98–111)
Creatinine, Ser: <0.3 mg/dL — ABNORMAL LOW (ref 0.44–1.00)
Glucose, Bld: 170 mg/dL — ABNORMAL HIGH (ref 70–99)
Potassium: 4.2 mmol/L (ref 3.5–5.1)
Sodium: 141 mmol/L (ref 135–145)

## 2020-02-21 LAB — MAGNESIUM: Magnesium: 1.6 mg/dL — ABNORMAL LOW (ref 1.7–2.4)

## 2020-02-21 LAB — GLUCOSE, CAPILLARY
Glucose-Capillary: 140 mg/dL — ABNORMAL HIGH (ref 70–99)
Glucose-Capillary: 156 mg/dL — ABNORMAL HIGH (ref 70–99)
Glucose-Capillary: 166 mg/dL — ABNORMAL HIGH (ref 70–99)
Glucose-Capillary: 180 mg/dL — ABNORMAL HIGH (ref 70–99)
Glucose-Capillary: 180 mg/dL — ABNORMAL HIGH (ref 70–99)

## 2020-02-21 MED ORDER — CLONIDINE HCL 0.2 MG PO TABS
0.2000 mg | ORAL_TABLET | Freq: Two times a day (BID) | ORAL | Status: DC
  Administered 2020-02-21 – 2020-02-26 (×10): 0.2 mg
  Filled 2020-02-21 (×10): qty 1

## 2020-02-21 MED ORDER — DEXAMETHASONE SODIUM PHOSPHATE 4 MG/ML IJ SOLN
4.0000 mg | INTRAMUSCULAR | Status: AC
  Administered 2020-02-22 – 2020-02-23 (×2): 4 mg via INTRAVENOUS
  Filled 2020-02-21 (×2): qty 1

## 2020-02-21 NOTE — Progress Notes (Signed)
PROGRESS NOTE    [name] [name]  XTG:[id number] DOB: 1956/05/03 DOA: 01/26/2020 PCP: Leonard Downing, MD  Brief Narrative: 64 year old female with history of heavy tobacco abuse, severe COPD/chronic respiratory failure on 4 L home O2, polysubstance abuse, type 2 diabetes mellitus, obesity, alcohol abuse, atrial fibrillation was admitted to [facility] on 9/5 with worsening pain swelling and drainage from her right foot, second toe and multiple ulcers on her lower leg along with fevers and chills. -She was found to have sepsis and group A strep bacteremia from cellulitis and mild fasciitis of right lower extremity, underwent debridement of epidermis and dermis of second toe on 9/16 by Dr. Constance Haw. [facility] course complicated by respiratory failure requiring mechanical ventilation on 9/7, she was extubated on 9/17 and then required reintubation due to stridor/respiratory distress, on 9/19 she was transferred to [facility], she was also followed by palliative care due to overall poor prognosis, eventually extubated on 9/23, has been on IV antibiotics for 24 days, finally discontinued on 9/29 -Transferred from PCCM to [facility] service  9/30 -Now with dysphagia, has a cortrak getting tube feeds  Assessment & Plan:     Acute respiratory failure with hypoxia and hypercapnia ([place]) -Secondary to pulmonary edema, sepsis, A. fib RVR, COPD exacerbation and mucous plugging -Required prolonged mechanical ventilation from 9/7, extubated and reintubated on 9/17 and finally extubated on 9/23, now DNR -Treated with diuretics also underwent multiple therapeutic bronchoscopies -Treated with multiple antibiotic courses, most recently on IV meropenem, stopped on 9/29 -Appears euvolemic at this time, will use Lasix prn -Also had stridor in ICU, on IV Decadron for this, no further stridor noted at this time, continue for 2 more days  -Stable from a pulmonary standpoint at this time however remains  at high risk of decompensation -Aspiration precautions  Sepsis Group B strep bacteremia -Right lower extremity ulcer, cellulitis -Underwent debridement on 9/16 -2D echocardiogram was unremarkable on 9/8 -Orthopedics consulted, right below-knee amputation was being considered, amputation deferred at this time -Antibiotics discontinued 9/29 by PCCM, has been on multiple different antibiotics for 24 days now, initially Rocephin and vancomycin from 9/5 and then vancomycin from 9/5 through 915 and then IV Ancef from 9/15-9/21, followed by IV meropenem from 9/27-9/29 -Continue local wound care -Palliative following as well  Transient A. fib with RVR -No further recurrence, systemic anticoagulation on hold  Severe COPD/chronic respiratory failure -On 4 L home O2 at baseline -High risk of decline in decompensation  Dysphagia -Currently on tube feeds via cortrak, palliative following, anticipate some improvement in dysphagia to allow p.o. -SLP following, plan for MBS/F EES today -No plans for PEG tube placement -Palliative following as well  Anxiety, depression, mood disorder -Resumed Cogentin, Depakote -On low-dose Klonopin now -Change Seroquel to nightly  Type 2 diabetes mellitus -Last hemoglobin A1c was 5.8 -Continue sliding scale insulin  DVT prophylaxis: Lovenox Code Status: DNR Family Communication: Discussed with daughter at bedside Disposition Plan:  Status is: Inpatient  Remains inpatient appropriate because:Inpatient level of care appropriate due to severity of illness   Dispo: The patient is from: Home              Anticipated d/c is to: SNF              Anticipated d/c date is: > 3 days              Patient currently is not medically stable to d/c.  Consultants:   Palliative care, PCCM, general surgery, orthopedics  Dr. Sharol Given   Procedures: Bedside debridement 9/16  Antimicrobials:    Subjective: -About to have a swallowing test today, breathing overall  improving, in good spirits  Objective: Vitals:   02/20/20 1036 02/20/20 2119 02/21/20 0536 02/21/20 1059  BP: 139/61 127/67 (!) 142/68 134/78  Pulse: 77 72 75 71  Resp: 18 14 (!) 24 20  Temp: 98.6 F (37 C) 97.9 F (36.6 C) 98 F (36.7 C) 99 F (37.2 C)  TempSrc: Oral Oral Oral Oral  SpO2: 98% 97% 97% 97%  Weight:  89.9 kg    Height:        Intake/Output Summary (Last 24 hours) at 02/21/2020 1414 Last data filed at 02/21/2020 0600 Gross per 24 hour  Intake 1980 ml  Output 550 ml  Net 1430 ml   Filed Weights   02/19/20 0500 02/19/20 1609 02/20/20 2119  Weight: 86.3 kg 88.4 kg 89.9 kg    Examination:  General exam: Chronically ill obese female sitting up in bed, awake alert oriented to self and place, no distress HEENT: No JVD, NG tube noted CVS: S1-S2, regular rate rhythm Lungs: Decreased breath sounds to bases, otherwise clear Abdomen: Soft, nontender, bowel sounds present Extremities: Trace edema, multiple wounds noted over her right leg Skin: As above Psychiatry:  Mood & affect appropriate.     Data Reviewed:   CBC: Recent Labs  Lab 02/17/20 0233 02/18/20 0309 02/19/20 0935 02/20/20 0513 02/21/20 0907  WBC 6.4 5.6 8.7 6.0 6.3  NEUTROABS 3.2 3.0 5.7 2.8 3.2  HGB 14.6 13.8 14.8 14.4 14.1  HCT 48.1* 45.0 50.7* 49.1* 47.8*  MCV 90.1 91.6 95.5 95.5 92.6  PLT 199 196 212 193 423   Basic Metabolic Panel: Recent Labs  Lab 02/16/20 0249 02/16/20 0249 02/17/20 0233 02/17/20 0957 02/19/20 0935 02/20/20 0513 02/21/20 0907  NA 136  --  138  --  145 145 141  K 3.2*   < > 5.4* 4.1 4.7 4.0 4.2  CL 87*  --  92*  --  98 101 95*  CO2 40*  --  36*  --  36* 39* 37*  GLUCOSE 133*  --  119*  --  150* 147* 170*  BUN 43*  --  41*  --  26* 22 15  CREATININE 0.42*  --  0.41*  --  0.32* <0.30* <0.30*  CALCIUM 9.7  --  9.6  --  9.2 9.1 9.2  MG 1.9  --  1.9  --  1.9 1.9 1.6*   < > = values in this interval not displayed.   GFR: CrCl cannot be calculated (This lab  value cannot be used to calculate CrCl because it is not a number: <0.30). Liver Function Tests: No results for input(s): AST, ALT, ALKPHOS, BILITOT, PROT, ALBUMIN in the last 168 hours. No results for input(s): LIPASE, AMYLASE in the last 168 hours. No results for input(s): AMMONIA in the last 168 hours. Coagulation Profile: No results for input(s): INR, PROTIME in the last 168 hours. Cardiac Enzymes: No results for input(s): CKTOTAL, CKMB, CKMBINDEX, TROPONINI in the last 168 hours. BNP (last 3 results) No results for input(s): PROBNP in the last 8760 hours. HbA1C: No results for input(s): HGBA1C in the last 72 hours. CBG: Recent Labs  Lab 02/20/20 1625 02/20/20 2126 02/21/20 0047 02/21/20 0755 02/21/20 1137  GLUCAP 149* 166* 180* 180* 156*   Lipid Profile: No results for input(s): CHOL, HDL, LDLCALC, TRIG, CHOLHDL, LDLDIRECT in the last 72 hours. Thyroid Function Tests:  No results for input(s): TSH, T4TOTAL, FREET4, T3FREE, THYROIDAB in the last 72 hours. Anemia Panel: No results for input(s): VITAMINB12, FOLATE, FERRITIN, TIBC, IRON, RETICCTPCT in the last 72 hours. Urine analysis:    Component Value Date/Time   COLORURINE YELLOW 02/01/2020 2040   APPEARANCEUR CLEAR 02/01/2020 2040   LABSPEC 1.031 (H) 02/01/2020 2040   PHURINE 5.0 02/01/2020 2040   GLUCOSEU NEGATIVE 02/01/2020 2040   HGBUR NEGATIVE 02/01/2020 2040   BILIRUBINUR NEGATIVE 02/01/2020 2040   KETONESUR NEGATIVE 02/01/2020 2040   PROTEINUR NEGATIVE 02/01/2020 2040   UROBILINOGEN 1.0 10/31/2012 1157   NITRITE NEGATIVE 02/01/2020 2040   LEUKOCYTESUR NEGATIVE 02/01/2020 2040   Sepsis Labs: '@LABRCNTIP' (procalcitonin:4,lacticidven:4)  )No results found for this or any previous visit (from the past 240 hour(s)).    Scheduled Meds:  benztropine  0.5 mg Per Tube BID   chlorhexidine gluconate (MEDLINE KIT)  15 mL Mouth Rinse BID   Chlorhexidine Gluconate Cloth  6 each Topical Q0600   clonazePAM  0.5 mg  Per Tube BID   cloNIDine  0.2 mg Per Tube TID   dexamethasone (DECADRON) injection  4 mg Intravenous Q24H   docusate  100 mg Per Tube BID   enoxaparin (LOVENOX) injection  40 mg Subcutaneous Q24H   feeding supplement (PROSource TF)  45 mL Per Tube TID   folic acid  1 mg Per Tube Daily   Gerhardt's butt cream   Topical BID   insulin aspart  0-9 Units Subcutaneous Q4H   mouth rinse  15 mL Mouth Rinse BID   mupirocin ointment   Nasal Daily   pantoprazole (PROTONIX) IV  40 mg Intravenous Q24H   polyethylene glycol  17 g Per Tube Daily   QUEtiapine  25 mg Per Tube QHS   thiamine  100 mg Per Tube Daily   Or   thiamine  100 mg Intravenous Daily   valproic acid  500 mg Per Tube BID   Continuous Infusions:  sodium chloride 40 mL/hr (02/15/20 0900)   feeding supplement (JEVITY 1.2 CAL) 70 mL/hr at 02/21/20 0828     LOS: 26 days    Time spent: 53mn  PDomenic Polite MD [facility]  02/21/2020, 2:14 PM

## 2020-02-21 NOTE — Progress Notes (Signed)
Patient ID: [name] [name], female   DOB: 1955-07-03, 64 y.o.   MRN: [id number]  This NP visited patient at the bedside as a follow up for palliative medicine needs and emotional support.    Patient is alert,  speech is garbled and she is short of breath on minimal exertion. She does not have capacity to make medical decisions.  She continues with significant dysphagia, core-trac in use for nutritional support.  She is very high risk for decompensation  [name]/daughter  is at the bedside, many questions and concerns.  Her mother's serious medical condition creates much anxiety for [facility].  She expresses appreciation for support from PMT.    Emotional support offered.  Daughter remains open to all offered and available medical interventions to prolong life.  Ongoing education with [name] regarding the importance of continued re-assessment of her mother's condition  ensuring decisions are within the context of her mothre's values and GOCs.  This patient and her family will need ongoing support in navigating health care decisions .          Questions and concerns addressed   Discussed with CCM attending and bedside RN   Total time spent on the unit was 35 minutes    PMT will continue to support holisically  Greater than 50% of the time was spent in counseling and coordination of care  [name] NP  Palliative Medicine Team Team Phone # [phone] Pager [phone]

## 2020-02-21 NOTE — Progress Notes (Signed)
Physical Therapy Treatment Patient Details Name: [name] [name] MRN: [id number] DOB: 1956-02-06 Today's Date: 02/21/2020    History of Present Illness 64 yo admitted 6/5 with RLE wound, sepsis due to cellulitis. Pt with Afib with RVR and required intubation (9/7-9/17, 9/17-9/23). Transfer to [facility] 9/19 for possible trach and potential amputation. As of 9/28 amputation currently being avoided. PMHx: smoker, COPD on home O2, bipolar, DM, HTN    PT Comments    The pt is continuing to make progress with mobility and therapy goals at this time as she was far more alert and eager to participate in OOB mobility today. The pt continues to require significant assist to complete OOB transfers such as sit-stand and stand-pivot, but was able to complete all with min/modA of 2 and use of a RW today. She continues to have difficulty with LE strength, stability, pain in her RLE, and benefits from frequent multimodal cues for positioning and technique. She will continue to benefit from skilled PT to address these deficits, and will require skilled PT following d/c to facilitate return to prior level of mobility and independence.     Follow Up Recommendations  SNF;Supervision/Assistance - 24 hour     Equipment Recommendations  3in1 (PT)    Recommendations for Other Services       Precautions / Restrictions Precautions Precautions: Fall Precaution Comments: cortrak, RLE wounds Restrictions Weight Bearing Restrictions: No RLE Weight Bearing: Weight bearing as tolerated    Mobility  Bed Mobility Overal bed mobility: Needs Assistance Bed Mobility: Supine to Sit     Supine to sit: Min assist;HOB elevated     General bed mobility comments: pt able to initiate movement of BLE to EOB, minA to complete movement, then minA to elevate trunk from elevated HOB. cues for use of rails, minA to bed pad to complete scooting and minA to maintain balance at EOB initially.   Transfers Overall transfer level:  Needs assistance Equipment used: Rolling walker (2 wheeled) Transfers: Sit to/from [facility] Sit to Stand: Min assist;+2 physical assistance;From elevated surface Stand pivot transfers: Min assist;+2 safety/equipment       General transfer comment: pt able to complete stand from elevated surface, requires increased time and multimodal cues to achieve full stand with hip extension, extra time to adjust RLE to stand functionally. Reports pain in RLE with stance. min/modA of 2 to pivot to [facility] and then to recliner. assist with RW and guiding hips  Ambulation/Gait             General Gait Details: unable at this time, unable to bear wt on RLE despite use of RW     Balance Overall balance assessment: Needs assistance Sitting-balance support: Bilateral upper extremity supported;Feet supported Sitting balance-Leahy Scale: Fair Sitting balance - Comments: minA initially, progressed to close minG   Standing balance support: Bilateral upper extremity supported Standing balance-Leahy Scale: Poor Standing balance comment: BUE support and min/modA of 2                            Cognition Arousal/Alertness: Awake/alert Behavior During Therapy: WFL for tasks assessed/performed Overall Cognitive Status: Impaired/Different from baseline Area of Impairment: Orientation;Memory;Following commands;Safety/judgement;Attention;Awareness;Problem solving                 Orientation Level: Disoriented to;Time;Situation Current Attention Level: Focused Memory: Decreased short-term memory Following Commands: Follows one step commands with increased time Safety/Judgement: Decreased awareness of safety;Decreased awareness of deficits Awareness: Intellectual  Problem Solving: Slow processing;Decreased initiation;Difficulty sequencing;Requires tactile cues;Requires verbal cues General Comments: pt with increased alertness, following simple commands with improved  consistency today compated to prior sessions, highly motivated and expressing excitement about getting OOB      Exercises      General Comments General comments (skin integrity, edema, etc.): wounds RLE with bandage      Pertinent Vitals/Pain Pain Assessment: No/denies pain Faces Pain Scale: No hurt Pain Location: during FEES Pain Descriptors / Indicators: Grimacing Pain Intervention(s): Monitored during session           PT Goals (current goals can now be found in the care plan section) Acute Rehab PT Goals Patient Stated Goal: regain strength and return home PT Goal Formulation: With family Time For Goal Achievement: 03/04/20 Potential to Achieve Goals: Fair    Frequency    Min 3X/week      PT Plan Current plan remains appropriate       AM-PAC PT "6 Clicks" Mobility   Outcome Measure  Help needed turning from your back to your side while in a flat bed without using bedrails?: A Little Help needed moving from lying on your back to sitting on the side of a flat bed without using bedrails?: A Little Help needed moving to and from a bed to a chair (including a wheelchair)?: A Lot Help needed standing up from a chair using your arms (e.g., wheelchair or bedside chair)?: A Lot Help needed to walk in hospital room?: Total Help needed climbing 3-5 steps with a railing? : Total 6 Click Score: 12    End of Session Equipment Utilized During Treatment: Gait belt Activity Tolerance: Patient tolerated treatment well Patient left: in chair;with chair alarm set;with call bell/phone within reach Nurse Communication: Mobility status;Need for lift equipment (reccomend use of stedy for return to bed) PT Visit Diagnosis: Other abnormalities of gait and mobility (R26.89);Difficulty in walking, not elsewhere classified (R26.2);Muscle weakness (generalized) (M62.81)     Time: 1638-4536 PT Time Calculation (min) (ACUTE ONLY): 39 min  Charges:  $Gait Training: 8-22  mins $Therapeutic Activity: 23-37 mins                     [name] [name], PT, DPT   Acute Rehabilitation Department Pager #: [phone]   [name] [name] 02/21/2020, 3:53 PM

## 2020-02-21 NOTE — Procedures (Signed)
Objective Swallowing Evaluation: Type of Study: FEES-Fiberoptic Endoscopic Evaluation of Swallow   Patient Details  Name: [name] [name] MRN: [id number] Date of Birth: February 13, 1956  Today's Date: 02/21/2020 Time: SLP Start Time (ACUTE ONLY): 1000 -SLP Stop Time (ACUTE ONLY): 1027  SLP Time Calculation (min) (ACUTE ONLY): 27 min   Past Medical History:  Past Medical History:  Diagnosis Date  . Anxiety   . Bipolar 1 disorder ([place])   . Cancer ([place])    colon  . Chronic diarrhea    Followed by Dr Michail Sermon   . Complication of anesthesia    Hx resp distress during surgery  . COPD (chronic obstructive pulmonary disease) ([place])   . Depression   . Diabetes mellitus ([place])   . External hemorrhoids   . GI bleed   . Headache(784.0)   . Hyperlipidemia   . Hypertension   . Hypothyroidism   . Insomnia   . Internal hemorrhoids   . Polysubstance abuse ([place]) Quit 2008    H/O methamphetamine and alcohol abuse, clean x 6 years  . Shortness of breath   . Suicidal thoughts   . Tobacco abuse   . Tubular adenoma of colon    Past Surgical History:  Past Surgical History:  Procedure Laterality Date  . ABDOMINAL HYSTERECTOMY    . APPENDECTOMY    . COLON SURGERY     "colon repair"  . COLONOSCOPY WITH PROPOFOL N/A 12/11/2012   Procedure: COLONOSCOPY WITH PROPOFOL;  Surgeon: Jerene Bears, MD;  Location: [facility];  Service: Gastroenterology;  Laterality: N/A;  . FINGER SURGERY     middle left finger  . HERNIA REPAIR     x 2  . KNEE SURGERY Left    Left knee tendon surgery  . TONSILLECTOMY    . TUBAL LIGATION    . WOUND DEBRIDEMENT Right 02/06/2020   Procedure: SKIN BIOPSY WITH DEBRIDEMENT RIGHT LOWER LEG;  Surgeon: Virl Cagey, MD;  Location: [facility];  Service: General;  Laterality: Right;   HPI: 64yo female admitted 01/26/20 with right foot pain and swelling, as well as chills, myalgias. Multiple intubations 9/7-17, 9/17-23. PMH: COPD HTN, DM2, tobacco abuse, chronic substance  abuse, BiPolar, colon cancer, anxiety/depression   Subjective: Pt resting in bed. No family present    Assessment / Plan / Recommendation  CHL IP CLINICAL IMPRESSIONS 02/21/2020  Clinical Impression She demonstrated oropharyngeal dysphagia marked by delayed oral transit, laryngeal protection and sensation. Vocal quality is significantly dysphonic with intermittent phonation if increased effort/breath achieved. Vocal cords appeared moderately edematous however her arytenoid cartilidiges, false cords and pharynx were not swollen or red. Transit was mildy delayed to posterior oral cavity and bolus falling over tongue base and hesitating in pyriform sinuses for longer than normal duration. She sillently aspirated honey thick (PAS 8) with penetration of nectar. Volitional cough cleared mild amount of aspiration. Recommend continue NPO except ice chips after oral care, trials puree with SLP only, Prognosis for improved swallow function is good.        SLP Visit Diagnosis Dysphagia, oropharyngeal phase (R13.12)  Attention and concentration deficit following --  Frontal lobe and executive function deficit following --  Impact on safety and function Moderate aspiration risk;Severe aspiration risk      CHL IP TREATMENT RECOMMENDATION 02/21/2020  Treatment Recommendations Therapy as outlined in treatment plan below     Prognosis 02/21/2020  Prognosis for Safe Diet Advancement Good  Barriers to Reach Goals --  Barriers/Prognosis Comment --    CHL IP  DIET RECOMMENDATION 02/21/2020  SLP Diet Recommendations NPO;Ice chips PRN after oral care  Liquid Administration via --  Medication Administration Via alternative means  Compensations --  Postural Changes --      CHL IP OTHER RECOMMENDATIONS 02/21/2020  Recommended Consults --  Oral Care Recommendations Oral care QID  Other Recommendations --      CHL IP FOLLOW UP RECOMMENDATIONS 02/21/2020  Follow up Recommendations Skilled Nursing facility       [facility] IP FREQUENCY AND DURATION 02/21/2020  Speech Therapy Frequency (ACUTE ONLY) min 2x/week  Treatment Duration 2 weeks           CHL IP ORAL PHASE 02/21/2020  Oral Phase Impaired  Oral - Pudding Teaspoon --  Oral - Pudding Cup --  Oral - Honey Teaspoon WFL  Oral - Honey Cup --  Oral - Nectar Teaspoon WFL  Oral - Nectar Cup --  Oral - Nectar Straw --  Oral - Thin Teaspoon Decreased bolus cohesion  Oral - Thin Cup --  Oral - Thin Straw --  Oral - Puree Delayed oral transit  Oral - Mech Soft --  Oral - Regular --  Oral - Multi-Consistency --  Oral - Pill --  Oral Phase - Comment --    CHL IP PHARYNGEAL PHASE 02/21/2020  Pharyngeal Phase Impaired  Pharyngeal- Pudding Teaspoon --  Pharyngeal --  Pharyngeal- Pudding Cup --  Pharyngeal --  Pharyngeal- Honey Teaspoon Delayed swallow initiation-vallecula;Pharyngeal residue - pyriform;Penetration/Aspiration during swallow;Inter-arytenoid space residue  Pharyngeal Material enters airway, remains ABOVE vocal cords and not ejected out  Pharyngeal- Honey Cup --  Pharyngeal --  Pharyngeal- Nectar Teaspoon Delayed swallow initiation-vallecula;Pharyngeal residue - pyriform;Penetration/Aspiration during swallow;Inter-arytenoid space residue  Pharyngeal Material enters airway, remains ABOVE vocal cords and not ejected out  Pharyngeal- Nectar Cup --  Pharyngeal --  Pharyngeal- Nectar Straw --  Pharyngeal --  Pharyngeal- Thin Teaspoon Pharyngeal residue - pyriform  Pharyngeal --  Pharyngeal- Thin Cup --  Pharyngeal --  Pharyngeal- Thin Straw --  Pharyngeal --  Pharyngeal- Puree Delayed swallow initiation-vallecula;Pharyngeal residue - pyriform  Pharyngeal --  Pharyngeal- Mechanical Soft --  Pharyngeal --  Pharyngeal- Regular --  Pharyngeal --  Pharyngeal- Multi-consistency --  Pharyngeal --  Pharyngeal- Pill --  Pharyngeal --  Pharyngeal Comment --     CHL IP CERVICAL ESOPHAGEAL PHASE 02/21/2020  Cervical Esophageal Phase  WFL  Pudding Teaspoon --  Pudding Cup --  Honey Teaspoon --  Honey Cup --  Nectar Teaspoon --  Nectar Cup --  Nectar Straw --  Thin Teaspoon --  Thin Cup --  Thin Straw --  Puree --  Mechanical Soft --  Regular --  Multi-consistency --  Pill --  Cervical Esophageal Comment --     Houston Siren 02/21/2020, 2:43 PM Orbie Pyo Kanya Potteiger M.Ed Risk analyst [phone] Office [phone]

## 2020-02-22 DIAGNOSIS — A419 Sepsis, unspecified organism: Secondary | ICD-10-CM | POA: Diagnosis not present

## 2020-02-22 DIAGNOSIS — J9601 Acute respiratory failure with hypoxia: Secondary | ICD-10-CM | POA: Diagnosis not present

## 2020-02-22 DIAGNOSIS — Z978 Presence of other specified devices: Secondary | ICD-10-CM | POA: Diagnosis not present

## 2020-02-22 DIAGNOSIS — L03115 Cellulitis of right lower limb: Secondary | ICD-10-CM | POA: Diagnosis not present

## 2020-02-22 LAB — CBC WITH DIFFERENTIAL/PLATELET
Abs Immature Granulocytes: 0.03 10*3/uL (ref 0.00–0.07)
Basophils Absolute: 0.1 10*3/uL (ref 0.0–0.1)
Basophils Relative: 1 %
Eosinophils Absolute: 0.3 10*3/uL (ref 0.0–0.5)
Eosinophils Relative: 4 %
HCT: 51.7 % — ABNORMAL HIGH (ref 36.0–46.0)
Hemoglobin: 15.5 g/dL — ABNORMAL HIGH (ref 12.0–15.0)
Immature Granulocytes: 0 %
Lymphocytes Relative: 34 %
Lymphs Abs: 2.5 10*3/uL (ref 0.7–4.0)
MCH: 27.1 pg (ref 26.0–34.0)
MCHC: 30 g/dL (ref 30.0–36.0)
MCV: 90.5 fL (ref 80.0–100.0)
Monocytes Absolute: 0.5 10*3/uL (ref 0.1–1.0)
Monocytes Relative: 7 %
Neutro Abs: 3.9 10*3/uL (ref 1.7–7.7)
Neutrophils Relative %: 54 %
Platelets: 188 10*3/uL (ref 150–400)
RBC: 5.71 MIL/uL — ABNORMAL HIGH (ref 3.87–5.11)
RDW: 14.1 % (ref 11.5–15.5)
WBC: 7.3 10*3/uL (ref 4.0–10.5)
nRBC: 0 % (ref 0.0–0.2)

## 2020-02-22 LAB — GLUCOSE, CAPILLARY
Glucose-Capillary: 137 mg/dL — ABNORMAL HIGH (ref 70–99)
Glucose-Capillary: 138 mg/dL — ABNORMAL HIGH (ref 70–99)
Glucose-Capillary: 140 mg/dL — ABNORMAL HIGH (ref 70–99)
Glucose-Capillary: 163 mg/dL — ABNORMAL HIGH (ref 70–99)
Glucose-Capillary: 182 mg/dL — ABNORMAL HIGH (ref 70–99)
Glucose-Capillary: 188 mg/dL — ABNORMAL HIGH (ref 70–99)

## 2020-02-22 LAB — BASIC METABOLIC PANEL
Anion gap: 14 (ref 5–15)
BUN: 18 mg/dL (ref 8–23)
CO2: 31 mmol/L (ref 22–32)
Calcium: 9.7 mg/dL (ref 8.9–10.3)
Chloride: 94 mmol/L — ABNORMAL LOW (ref 98–111)
Creatinine, Ser: <0.3 mg/dL — ABNORMAL LOW (ref 0.44–1.00)
Glucose, Bld: 131 mg/dL — ABNORMAL HIGH (ref 70–99)
Potassium: 4.8 mmol/L (ref 3.5–5.1)
Sodium: 139 mmol/L (ref 135–145)

## 2020-02-22 LAB — MAGNESIUM: Magnesium: 1.7 mg/dL (ref 1.7–2.4)

## 2020-02-22 MED ORDER — CHLORHEXIDINE GLUCONATE 0.12 % MT SOLN
OROMUCOSAL | Status: AC
  Administered 2020-02-22: 15 mL
  Filled 2020-02-22: qty 15

## 2020-02-22 MED ORDER — SODIUM CHLORIDE 0.9% FLUSH: 10.0000 mL | INTRAVENOUS | Status: DC | PRN

## 2020-02-22 NOTE — Progress Notes (Signed)
PROGRESS NOTE    Kynisha Memon  HMC:[id number] DOB: [birth year] DOA: 01/26/2020 PCP: Leonard Downing, MD  Brief Narrative: 64 year old female with history of heavy tobacco abuse, severe COPD/chronic respiratory failure on 4 L home O2, polysubstance abuse, type 2 diabetes mellitus, obesity, alcohol abuse, atrial fibrillation was admitted to [facility] on 9/5 with worsening pain swelling and drainage from her right foot, second toe and multiple ulcers on her lower leg along with fevers and chills. -She was found to have sepsis and group A strep bacteremia from cellulitis and mild fasciitis of right lower extremity, underwent debridement of epidermis and dermis of second toe on 9/16 by Dr. Constance Haw. [facility] course complicated by respiratory failure requiring mechanical ventilation on 9/7, she was extubated on 9/17 and then required reintubation due to stridor/respiratory distress, on 9/19 she was transferred to [facility], she was also followed by palliative care due to overall poor prognosis, eventually extubated on 9/23, has been on IV antibiotics for 24 days, finally discontinued on 9/29 -Transferred from PCCM to [facility] service  9/30 -Now with dysphagia, has a cortrak getting tube feeds  Assessment & Plan:     Acute respiratory failure with hypoxia and hypercapnia ([place]) -Secondary to pulmonary edema, sepsis, A. fib RVR, COPD exacerbation and mucous plugging -Required prolonged mechanical ventilation from 9/7, extubated and reintubated on 9/17 and finally extubated on 9/23, now DNR -Treated with diuretics also underwent multiple therapeutic bronchoscopies -Treated with multiple antibiotic courses, most recently on IV meropenem, stopped on 9/29 -Appears euvolemic at this time, will use Lasix prn -Also had stridor in ICU, on IV Decadron for this, no further stridor noted at this time, continue for 1 more day  -Stable from a pulmonary standpoint at this time however remains at  high risk of decompensation -Aspiration precautions  Sepsis, poa Group B strep bacteremia -Right lower extremity ulcer, cellulitis -Underwent debridement on 9/16 -2D echocardiogram was unremarkable on 9/8 -Orthopedics consulted, right below-knee amputation was being considered, amputation deferred at this time -Antibiotics discontinued 9/29 by PCCM, has been on multiple different antibiotics for 24 days now, initially Rocephin and vancomycin from 9/5 and then vancomycin from 9/5 through 915 and then IV Ancef from 9/15-9/21, followed by IV meropenem from 9/27-9/29 -Continue local wound care -Palliative following as well  Transient A. fib with RVR -No further recurrence, systemic anticoagulation on hold  Severe COPD/chronic respiratory failure -On 4 L home O2 at baseline -High risk of  decompensation  Dysphagia -Currently on tube feeds via cortrak, palliative following, anticipate some improvement in dysphagia to allow p.o. -SLP following, s/p for MBS/F EES 10/1, vocal cords still edematous and silent aspiration noted, continue n.p.o., anticipate some improvement with time, recheck early next week -No plans for PEG tube placement -Palliative following   Anxiety, depression, mood disorder -Resumed Cogentin, Depakote -On low-dose Klonopin now -Change Seroquel to nightly  Type 2 diabetes mellitus -Last hemoglobin A1c was 5.8 -Continue sliding scale insulin  DVT prophylaxis: Lovenox Code Status: DNR Family Communication: Discussed with daughter at bedside Disposition Plan:  Status is: Inpatient  Remains inpatient appropriate because:Inpatient level of care appropriate due to severity of illness   Dispo: The patient is from: Home              Anticipated d/c is to: SNF              Anticipated d/c date is: > 3 days  Patient currently is not medically stable to d/c.  Consultants:   Palliative care, PCCM, general surgery, orthopedics Dr. Sharol Given   Procedures:  Bedside debridement 9/16  Antimicrobials:    Subjective: -Hungry, more thirsty wants to eat and drink something, allowed to have small amounts of ice chips  Objective: Vitals:   02/21/20 2029 02/22/20 0442 02/22/20 0648 02/22/20 0851  BP: 129/72 139/72  (!) 142/68  Pulse: 77 76  79  Resp: '18 18  18  ' Temp: 98 F (36.7 C) 98 F (36.7 C)  98.7 F (37.1 C)  TempSrc:  Oral  Oral  SpO2: 97% 97%  91%  Weight:   90.2 kg   Height:        Intake/Output Summary (Last 24 hours) at 02/22/2020 1453 Last data filed at 02/22/2020 1324 Gross per 24 hour  Intake 960 ml  Output 1500 ml  Net -540 ml   Filed Weights   02/19/20 1609 02/20/20 2119 02/22/20 0648  Weight: 88.4 kg 89.9 kg 90.2 kg    Examination:  General exam: Chronically ill obese female sitting up in bed, awake alert oriented to self and place, no distress HEENT: No JVD, NG tube noted CVS: S1-S2, regular rate rhythm Lungs: Decreased breath sounds to bases, otherwise clear Abdomen: Soft, nontender, bowel sounds present Extremities: No edema,  multiple wounds noted over her right leg  Skin: As above Psychiatry:  Mood & affect appropriate.     Data Reviewed:   CBC: Recent Labs  Lab 02/18/20 0309 02/19/20 0935 02/20/20 0513 02/21/20 0907 02/22/20 1035  WBC 5.6 8.7 6.0 6.3 7.3  NEUTROABS 3.0 5.7 2.8 3.2 3.9  HGB 13.8 14.8 14.4 14.1 15.5*  HCT 45.0 50.7* 49.1* 47.8* 51.7*  MCV 91.6 95.5 95.5 92.6 90.5  PLT 196 212 193 199 330   Basic Metabolic Panel: Recent Labs  Lab 02/17/20 0233 02/17/20 0233 02/17/20 0957 02/19/20 0935 02/20/20 0513 02/21/20 0907 02/22/20 0738  NA 138  --   --  145 145 141 139  K 5.4*   < > 4.1 4.7 4.0 4.2 4.8  CL 92*  --   --  98 101 95* 94*  CO2 36*  --   --  36* 39* 37* 31  GLUCOSE 119*  --   --  150* 147* 170* 131*  BUN 41*  --   --  26* '22 15 18  ' CREATININE 0.41*  --   --  0.32* <0.30* <0.30* <0.30*  CALCIUM 9.6  --   --  9.2 9.1 9.2 9.7  MG 1.9  --   --  1.9 1.9 1.6* 1.7     < > = values in this interval not displayed.   GFR: CrCl cannot be calculated (This lab value cannot be used to calculate CrCl because it is not a number: <0.30). Liver Function Tests: No results for input(s): AST, ALT, ALKPHOS, BILITOT, PROT, ALBUMIN in the last 168 hours. No results for input(s): LIPASE, AMYLASE in the last 168 hours. No results for input(s): AMMONIA in the last 168 hours. Coagulation Profile: No results for input(s): INR, PROTIME in the last 168 hours. Cardiac Enzymes: No results for input(s): CKTOTAL, CKMB, CKMBINDEX, TROPONINI in the last 168 hours. BNP (last 3 results) No results for input(s): PROBNP in the last 8760 hours. HbA1C: No results for input(s): HGBA1C in the last 72 hours. CBG: Recent Labs  Lab 02/21/20 2028 02/22/20 0025 02/22/20 0441 02/22/20 0738 02/22/20 1148  GLUCAP 140* 140* 163* 137* 188*  Lipid Profile: No results for input(s): CHOL, HDL, LDLCALC, TRIG, CHOLHDL, LDLDIRECT in the last 72 hours. Thyroid Function Tests: No results for input(s): TSH, T4TOTAL, FREET4, T3FREE, THYROIDAB in the last 72 hours. Anemia Panel: No results for input(s): VITAMINB12, FOLATE, FERRITIN, TIBC, IRON, RETICCTPCT in the last 72 hours. Urine analysis:    Component Value Date/Time   COLORURINE YELLOW 02/01/2020 2040   APPEARANCEUR CLEAR 02/01/2020 2040   LABSPEC 1.031 (H) 02/01/2020 2040   PHURINE 5.0 02/01/2020 2040   GLUCOSEU NEGATIVE 02/01/2020 2040   HGBUR NEGATIVE 02/01/2020 2040   BILIRUBINUR NEGATIVE 02/01/2020 2040   KETONESUR NEGATIVE 02/01/2020 2040   PROTEINUR NEGATIVE 02/01/2020 2040   UROBILINOGEN 1.0 10/31/2012 1157   NITRITE NEGATIVE 02/01/2020 2040   LEUKOCYTESUR NEGATIVE 02/01/2020 2040   Sepsis Labs: '@LABRCNTIP' (procalcitonin:4,lacticidven:4)  )No results found for this or any previous visit (from the past 240 hour(s)).    Scheduled Meds:  benztropine  0.5 mg Per Tube BID   chlorhexidine gluconate (MEDLINE KIT)  15 mL  Mouth Rinse BID   Chlorhexidine Gluconate Cloth  6 each Topical Q0600   clonazePAM  0.5 mg Per Tube BID   cloNIDine  0.2 mg Per Tube BID   dexamethasone (DECADRON) injection  4 mg Intravenous Q24H   enoxaparin (LOVENOX) injection  40 mg Subcutaneous Q24H   feeding supplement (PROSource TF)  45 mL Per Tube TID   folic acid  1 mg Per Tube Daily   Gerhardt's butt cream   Topical BID   insulin aspart  0-9 Units Subcutaneous Q4H   mouth rinse  15 mL Mouth Rinse BID   mupirocin ointment   Nasal Daily   pantoprazole (PROTONIX) IV  40 mg Intravenous Q24H   polyethylene glycol  17 g Per Tube Daily   QUEtiapine  25 mg Per Tube QHS   thiamine  100 mg Per Tube Daily   Or   thiamine  100 mg Intravenous Daily   valproic acid  500 mg Per Tube BID   Continuous Infusions:  sodium chloride 40 mL/hr (02/15/20 0900)   feeding supplement (JEVITY 1.2 CAL) 1,000 mL (02/21/20 2314)     LOS: 27 days   Time spent: 73mn  PDomenic Polite MD [facility]  02/22/2020, 2:53 PM

## 2020-02-22 NOTE — NC FL2 (Signed)
[place] LEVEL OF CARE SCREENING TOOL     IDENTIFICATION  Patient Name: [name] [name] Birthdate: 27-Jul-1955 Sex: female Admission Date (Current Location): 01/26/2020  [place] and [place] Number:  Kathleen Argue [id number] [place] and Address:  [facility] [place], [place] [address], [place], [place] [address]      Provider Number: [id number]  Attending Physician Name and Address:  Domenic Polite, MD  Relative Name and Phone Number:       Current Level of Care: SNF Recommended Level of Care: [place] Prior Approval Number:    Date Approved/Denied:   PASRR Number: pending  Discharge Plan: SNF    Current Diagnoses: Patient Active Problem List   Diagnosis Date Noted  . Dysphagia   . Adult failure to thrive   . Dyspnea   . Necrotizing fasciitis of lower leg ([place])   . Hypoxia   . Petechial rash   . Toe infection   . Nasogastric tube present   . Palliative care by specialist   . Goals of care, counseling/discussion   . DNR (do not resuscitate)   . Atrial fibrillation with RVR ([place]) 02/05/2020  . Pleural effusion on right 02/05/2020  . Leukocytosis 02/05/2020  . Gangrene of toe of right foot ([place])   . Cellulitis of right lower leg   . Sepsis ([place]) 01/26/2020  . COPD still smoking/ pft's pending  12/27/2012  . Chronic respiratory failure ([place]) 12/27/2012  . Personal history of colonic polyps 12/11/2012  . Chronic diarrhea 12/11/2012  . Benign neoplasm of colon 12/11/2012  . Migraine 12/04/2012  . Alcohol abuse 12/04/2012  . Gait instability 08/29/2012  . Tremor 08/29/2012  . Hyperlipidemia 07/08/2012  . Acute respiratory failure with hypoxia and hypercapnia ([place]) 07/08/2012  . COPD exacerbation ([place]) 04/13/2012  . Acute respiratory failure ([place]) 04/13/2012  . Low back pain 04/13/2012  . Thrombocytopenia ([place]) 04/13/2012  . Non-insulin dependent type 2 diabetes mellitus ([place]) 04/13/2012  . Tobacco abuse 04/13/2012  .  Hypertension   . Bipolar 1 disorder ([place])   . Insomnia   . Depression   . Suicidal thoughts     Orientation RESPIRATION BLADDER Height & Weight     Situation, Self, Place  O2 (2L nasal canula) Incontinent, External catheter Weight: 198 lb 13.7 oz (90.2 kg) Height:  _0  (165.1 cm)  BEHAVIORAL SYMPTOMS/MOOD NEUROLOGICAL BOWEL NUTRITION STATUS      Continent Diet (see discharge summary)  AMBULATORY STATUS COMMUNICATION OF NEEDS Skin   Extensive Assist Verbally Other (Comment), Surgical wounds (abrasion on R buttocks; R leg cellulitis; generalized ecchymosis; closed incision on R leg pretibial w/ compression wrap; wound on right foot w/ black toe)                       Personal Care Assistance Level of Assistance  Bathing, Feeding, Dressing Bathing Assistance: Maximum assistance Feeding assistance: Limited assistance Dressing Assistance: Maximum assistance     Functional Limitations Info  Sight, Hearing, Speech Sight Info: Adequate Hearing Info: Adequate Speech Info: Adequate    SPECIAL CARE FACTORS FREQUENCY  PT (By licensed PT), OT (By licensed OT)     PT Frequency: 5x week OT Frequency: 5x week            Contractures Contractures Info: Not present    Additional Factors Info  Psychotropic, Code Status, Allergies, Isolation Precautions Code Status Info: DNR Allergies Info: Bactrim (Sulfamethoxazole-trimethoprim), Amoxicillin, Diclofenac Psychotropic Info: clonazePAM (KLONOPIN) tablet 0.5 mg 2x  daily; QUEtiapine (SEROQUEL) tablet 25 mg daily at bedtime   Isolation Precautions Info: OMDRO     Current Medications (02/22/2020):  This is the current hospital active medication list Current Facility-Administered Medications  Medication Dose Route Frequency Provider Last Rate Last Admin  . 0.9 %  sodium chloride infusion   Intravenous PRN Barton Dubois, MD 40 mL/hr at 02/15/20 0900 40 mL/hr at 02/15/20 0900  . acetaminophen (TYLENOL) suppository 650 mg  650 mg  Rectal Q6H PRN Domenic Polite, MD       Or  . acetaminophen (TYLENOL) 160 MG/5ML solution 650 mg  650 mg Per Tube Q6H PRN Domenic Polite, MD   650 mg at 02/21/20 0941  . benztropine (COGENTIN) tablet 0.5 mg  0.5 mg Per Tube BID Lang Snow, FNP   0.5 mg at 02/22/20 1030  . chlorhexidine gluconate (MEDLINE KIT) (PERIDEX) 0.12 % solution 15 mL  15 mL Mouth Rinse BID Chesley Mires, MD   15 mL at 02/21/20 1307  . Chlorhexidine Gluconate Cloth 2 % PADS 6 each  6 each Topical Q0600 Audria Nine, DO   6 each at 02/22/20 0539  . clonazePAM (KLONOPIN) tablet 0.5 mg  0.5 mg Per Tube BID Einar Grad, RPH   0.5 mg at 02/22/20 1029  . cloNIDine (CATAPRES) tablet 0.2 mg  0.2 mg Per Tube BID Domenic Polite, MD   0.2 mg at 02/22/20 1029  . dexamethasone (DECADRON) injection 4 mg  4 mg Intravenous Q24H Domenic Polite, MD   4 mg at 02/22/20 1031  . enoxaparin (LOVENOX) injection 40 mg  40 mg Subcutaneous Q24H Lyndee Leo, RPH   40 mg at 02/22/20 1029  . feeding supplement (JEVITY 1.2 CAL) liquid 1,000 mL  1,000 mL Per Tube Continuous Candee Furbish, MD 70 mL/hr at 02/21/20 2314 1,000 mL at 02/21/20 2314  . feeding supplement (PROSource TF) liquid 45 mL  45 mL Per Tube TID Candee Furbish, MD   45 mL at 02/22/20 1031  . folic acid (FOLVITE) tablet 1 mg  1 mg Per Tube Daily Einar Grad, RPH   1 mg at 02/22/20 1029  . Gerhardt's butt cream   Topical BID Candee Furbish, MD   Given at 02/21/20 2318  . guaiFENesin (ROBITUSSIN) 100 MG/5ML solution 200 mg  10 mL Per Tube Q4H PRN Anders Simmonds, MD   200 mg at 02/17/20 2208  . hemostatic agents    PRN Virl Cagey, MD   1 application at 19/41/74 0805  . insulin aspart (novoLOG) injection 0-9 Units  0-9 Units Subcutaneous Q4H Barton Dubois, MD   1 Units at 02/22/20 0818  . labetalol (NORMODYNE) injection 10 mg  10 mg Intravenous Q2H PRN Heath Lark D, DO   10 mg at 02/14/20 0814  . levalbuterol (XOPENEX) nebulizer solution 0.63 mg   0.63 mg Nebulization Q6H PRN Chesley Mires, MD   0.63 mg at 02/16/20 1224  . MEDLINE mouth rinse  15 mL Mouth Rinse BID Candee Furbish, MD   15 mL at 02/22/20 1032  . mupirocin ointment (BACTROBAN) 2 %   Nasal Daily Virl Cagey, MD   Given at 02/22/20 1032  . ondansetron (ZOFRAN) tablet 4 mg  4 mg Oral Q6H PRN Barton Dubois, MD       Or  . ondansetron [facility]) injection 4 mg  4 mg Intravenous Q6H PRN Barton Dubois, MD   4 mg at 02/05/20 0816  . pantoprazole (PROTONIX) injection 40  mg  40 mg Intravenous Q24H Einar Grad, RPH   40 mg at 02/22/20 1029  . polyethylene glycol (MIRALAX / GLYCOLAX) packet 17 g  17 g Per Tube Daily Lyndee Leo, RPH   17 g at 02/22/20 1030  . QUEtiapine (SEROQUEL) tablet 25 mg  25 mg Per Tube QHS Domenic Polite, MD   25 mg at 02/21/20 2319  . sodium chloride flush (NS) 0.9 % injection 10-40 mL  10-40 mL Intracatheter PRN Domenic Polite, MD      . thiamine tablet 100 mg  100 mg Per Tube Daily Icard, Bradley L, DO   100 mg at 02/21/20 4439   Or  . thiamine (B-1) injection 100 mg  100 mg Intravenous Daily Icard, Bradley L, DO   100 mg at 02/22/20 1029  . valproic acid (DEPAKENE) 250 MG/5ML solution 500 mg  500 mg Per Tube BID Audria Nine, DO   500 mg at 02/22/20 1030     Discharge Medications: Please see discharge summary for a list of discharge medications.  Relevant Imaging Results:  Relevant Lab Results:   Additional Information SS# [id number]; unvaccinated  [place], [place]

## 2020-02-23 DIAGNOSIS — J9601 Acute respiratory failure with hypoxia: Secondary | ICD-10-CM | POA: Diagnosis not present

## 2020-02-23 DIAGNOSIS — L03115 Cellulitis of right lower limb: Secondary | ICD-10-CM | POA: Diagnosis not present

## 2020-02-23 DIAGNOSIS — A419 Sepsis, unspecified organism: Secondary | ICD-10-CM | POA: Diagnosis not present

## 2020-02-23 DIAGNOSIS — Z978 Presence of other specified devices: Secondary | ICD-10-CM | POA: Diagnosis not present

## 2020-02-23 LAB — BASIC METABOLIC PANEL
Anion gap: 10 (ref 5–15)
BUN: 20 mg/dL (ref 8–23)
CO2: 36 mmol/L — ABNORMAL HIGH (ref 22–32)
Calcium: 9.8 mg/dL (ref 8.9–10.3)
Chloride: 94 mmol/L — ABNORMAL LOW (ref 98–111)
Creatinine, Ser: 0.36 mg/dL — ABNORMAL LOW (ref 0.44–1.00)
GFR calc Af Amer: >60 mL/min (ref >60)
GFR calc non Af Amer: >60 mL/min (ref >60)
Glucose, Bld: 183 mg/dL — ABNORMAL HIGH (ref 70–99)
Potassium: 3.9 mmol/L (ref 3.5–5.1)
Sodium: 140 mmol/L (ref 135–145)

## 2020-02-23 LAB — CBC WITH DIFFERENTIAL/PLATELET
Abs Immature Granulocytes: 0.02 10*3/uL (ref 0.00–0.07)
Basophils Absolute: 0.1 10*3/uL (ref 0.0–0.1)
Basophils Relative: 1 %
Eosinophils Absolute: 0.1 10*3/uL (ref 0.0–0.5)
Eosinophils Relative: 2 %
HCT: 47.5 % — ABNORMAL HIGH (ref 36.0–46.0)
Hemoglobin: 14.3 g/dL (ref 12.0–15.0)
Immature Granulocytes: 0 %
Lymphocytes Relative: 37 %
Lymphs Abs: 2.8 10*3/uL (ref 0.7–4.0)
MCH: 27.2 pg (ref 26.0–34.0)
MCHC: 30.1 g/dL (ref 30.0–36.0)
MCV: 90.3 fL (ref 80.0–100.0)
Monocytes Absolute: 0.4 10*3/uL (ref 0.1–1.0)
Monocytes Relative: 5 %
Neutro Abs: 4.2 10*3/uL (ref 1.7–7.7)
Neutrophils Relative %: 55 %
Platelets: 182 10*3/uL (ref 150–400)
RBC: 5.26 MIL/uL — ABNORMAL HIGH (ref 3.87–5.11)
RDW: 13.8 % (ref 11.5–15.5)
WBC: 7.7 10*3/uL (ref 4.0–10.5)
nRBC: 0 % (ref 0.0–0.2)

## 2020-02-23 LAB — GLUCOSE, CAPILLARY
Glucose-Capillary: 152 mg/dL — ABNORMAL HIGH (ref 70–99)
Glucose-Capillary: 155 mg/dL — ABNORMAL HIGH (ref 70–99)
Glucose-Capillary: 235 mg/dL — ABNORMAL HIGH (ref 70–99)
Glucose-Capillary: 253 mg/dL — ABNORMAL HIGH (ref 70–99)
Glucose-Capillary: 169 mg/dL — ABNORMAL HIGH (ref 70–99)

## 2020-02-23 MED ORDER — BISACODYL 10 MG RE SUPP
10.0000 mg | Freq: Once | RECTAL | Status: AC
  Administered 2020-02-23: 10 mg via RECTAL
  Filled 2020-02-23: qty 1

## 2020-02-23 NOTE — Progress Notes (Signed)
PROGRESS NOTE    [name] [name]  HMC:[id number] DOB: [birth year] DOA: 01/26/2020 PCP: Leonard Downing, MD  Brief Narrative: 64 year old female with history of heavy tobacco abuse, severe COPD/chronic respiratory failure on 4 L home O2, polysubstance abuse, type 2 diabetes mellitus, obesity, alcohol abuse, atrial fibrillation was admitted to [facility] on 9/5 with worsening pain swelling and drainage from her right foot, second toe and multiple ulcers on her lower leg along with fevers and chills. -She was found to have sepsis and group A strep bacteremia from cellulitis and mild fasciitis of right lower extremity, underwent debridement of epidermis and dermis of second toe on 9/16 by Dr. Constance Haw. [facility] course complicated by respiratory failure requiring mechanical ventilation on 9/7, she was extubated on 9/17 and then required reintubation due to stridor/respiratory distress, on 9/19 she was transferred to [facility], she was also followed by palliative care due to overall poor prognosis, eventually extubated on 9/23, has been on IV antibiotics for 24 days, finally discontinued on 9/29 -Transferred from PCCM to [facility] service  9/30 -Now with dysphagia, has a cortrak getting tube feeds  Assessment & Plan:     Acute respiratory failure with hypoxia and hypercapnia ([place]) -Secondary to pulmonary edema, sepsis, A. fib RVR, COPD exacerbation and mucous plugging -Required prolonged mechanical ventilation from 9/7, extubated and reintubated on 9/17 and finally extubated on 9/23, now DNR -Treated with diuretics also underwent multiple therapeutic bronchoscopies -Treated with multiple antibiotic courses, most recently on IV meropenem, stopped on 9/29 -Appears euvolemic at this time, will use Lasix as needed -Also had stridor in the ICU, was on IV Decadron for vocal cord edema, now off -Remains stable from pulmonary standpoint however remains at high risk of aspiration and  decompensation -Plan for SLP reevaluation in 1 to 2 days -Plan for SNF with palliative care if she is able to eat next week  Sepsis, poa Group B strep bacteremia -Right lower extremity ulcer, cellulitis -Underwent debridement on 9/16 -2D echocardiogram was unremarkable on 9/8, repeat blood cultures were negative -Orthopedics consulted, right below-knee amputation was being considered, amputation deferred at this time -Antibiotics discontinued 9/29 by PCCM, has been on multiple different antibiotics for 24 days now, initially Rocephin and vancomycin from 9/5 and then vancomycin from 9/5 through 915 and then IV Ancef from 9/15-9/21, followed by IV meropenem from 9/27-9/29 -Continue local wound care -Palliative following as well  Transient A. fib with RVR -No further recurrence, systemic anticoagulation on hold  Severe COPD/chronic respiratory failure -On 4 L home O2 at baseline -High risk of  decompensation  Dysphagia -Currently on tube feeds via cortrak, palliative following, anticipate some improvement in dysphagia to allow p.o. -SLP following, s/p for MBS/F EES 10/1, vocal cords still edematous and silent aspiration noted, continue n.p.o., anticipate some improvement with time,  -Plan for reassessment of swallowing in the next 1 to 2 days -No plans for PEG tube placement -Palliative following   Anxiety, depression, mood disorder -Resumed Cogentin, Depakote -On low-dose Klonopin now -Change Seroquel to nightly  Type 2 diabetes mellitus -Last hemoglobin A1c was 5.8 -Continue sliding scale insulin  DVT prophylaxis: Lovenox Code Status: DNR Family Communication: Discussed with daughter at bedside Disposition Plan:  Status is: Inpatient  Remains inpatient appropriate because:Inpatient level of care appropriate due to severity of illness   Dispo: The patient is from: Home              Anticipated d/c is to: SNF  Anticipated d/c date is: > 3 days               Patient currently is not medically stable to d/c.  Consultants:   Palliative care, PCCM, general surgery, orthopedics Dr. Sharol Given   Procedures: Bedside debridement 9/16  Antimicrobials:    Subjective: -Feels okay now, had some issues with anxiety overnight, no respiratory distress  Objective: Vitals:   02/22/20 1639 02/22/20 2017 02/23/20 0709 02/23/20 0929  BP: 134/80 (!) 138/96 125/67 (!) 154/90  Pulse: 85 85 77 88  Resp: _0 Temp: 97.8 F (36.6 C) 98.2 F (36.8 C) 97.9 F (36.6 C) 98.1 F (36.7 C)  TempSrc:  Oral    SpO2: 92% 96% 95% 95%  Weight:  88.5 kg    Height:        Intake/Output Summary (Last 24 hours) at 02/23/2020 1222 Last data filed at 02/23/2020 0936 Gross per 24 hour  Intake 270 ml  Output 1400 ml  Net -1130 ml   Filed Weights   02/20/20 2119 02/22/20 0648 02/22/20 2017  Weight: 89.9 kg 90.2 kg 88.5 kg    Examination:  General exam: Chronically ill obese female sitting up in bed, AAOx3, no distress HEENT: No JVD, NG tube with tube feeds infusing CVS: S1-S2, regular rate rhythm Lungs: Poor air movement bilaterally, diminished at the bases otherwise clear Abdomen: Soft, nontender, less distended today, bowel sounds present Extremities: No edema, multiple wounds on her right lower leg and wound on the second toe of her right foot  Skin: As above Psychiatry:  Mood & affect appropriate.     Data Reviewed:   CBC: Recent Labs  Lab 02/19/20 0935 02/20/20 0513 02/21/20 0907 02/22/20 1035 02/23/20 0233  WBC 8.7 6.0 6.3 7.3 7.7  NEUTROABS 5.7 2.8 3.2 3.9 4.2  HGB 14.8 14.4 14.1 15.5* 14.3  HCT 50.7* 49.1* 47.8* 51.7* 47.5*  MCV 95.5 95.5 92.6 90.5 90.3  PLT 212 193 199 188 366   Basic Metabolic Panel: Recent Labs  Lab 02/17/20 0233 02/17/20 0957 02/19/20 0935 02/20/20 0513 02/21/20 0907 02/22/20 0738 02/23/20 0233  NA 138  --  145 145 141 139 140  K 5.4*   < > 4.7 4.0 4.2 4.8 3.9  CL 92*  --  98 101 95* 94* 94*  CO2 36*   --  36* 39* 37* 31 36*  GLUCOSE 119*  --  150* 147* 170* 131* 183*  BUN 41*  --  26* _1 CREATININE 0.41*  --  0.32* <0.30* <0.30* <0.30* 0.36*  CALCIUM 9.6  --  9.2 9.1 9.2 9.7 9.8  MG 1.9  --  1.9 1.9 1.6* 1.7  --    < > = values in this interval not displayed.   GFR: Estimated Creatinine Clearance: 79.1 mL/min (A) (by C-G formula based on SCr of 0.36 mg/dL (L)). Liver Function Tests: No results for input(s): AST, ALT, ALKPHOS, BILITOT, PROT, ALBUMIN in the last 168 hours. No results for input(s): LIPASE, AMYLASE in the last 168 hours. No results for input(s): AMMONIA in the last 168 hours. Coagulation Profile: No results for input(s): INR, PROTIME in the last 168 hours. Cardiac Enzymes: No results for input(s): CKTOTAL, CKMB, CKMBINDEX, TROPONINI in the last 168 hours. BNP (last 3 results) No results for input(s): PROBNP in the last 8760 hours. HbA1C: No results for input(s): HGBA1C in the last 72 hours. CBG: Recent Labs  Lab 02/22/20 1639 02/22/20 2018 02/23/20 0107  02/23/20 0604 02/23/20 1150  GLUCAP 182* 138* 152* 155* 235*   Lipid Profile: No results for input(s): CHOL, HDL, LDLCALC, TRIG, CHOLHDL, LDLDIRECT in the last 72 hours. Thyroid Function Tests: No results for input(s): TSH, T4TOTAL, FREET4, T3FREE, THYROIDAB in the last 72 hours. Anemia Panel: No results for input(s): VITAMINB12, FOLATE, FERRITIN, TIBC, IRON, RETICCTPCT in the last 72 hours. Urine analysis:    Component Value Date/Time   COLORURINE YELLOW 02/01/2020 2040   APPEARANCEUR CLEAR 02/01/2020 2040   LABSPEC 1.031 (H) 02/01/2020 2040   PHURINE 5.0 02/01/2020 2040   GLUCOSEU NEGATIVE 02/01/2020 2040   HGBUR NEGATIVE 02/01/2020 2040   BILIRUBINUR NEGATIVE 02/01/2020 2040   KETONESUR NEGATIVE 02/01/2020 2040   PROTEINUR NEGATIVE 02/01/2020 2040   UROBILINOGEN 1.0 10/31/2012 1157   NITRITE NEGATIVE 02/01/2020 2040   LEUKOCYTESUR NEGATIVE 02/01/2020 2040   Sepsis  Labs: _0 (procalcitonin:4,lacticidven:4)  )No results found for this or any previous visit (from the past 240 hour(s)).    Scheduled Meds: . benztropine  0.5 mg Per Tube BID  . chlorhexidine gluconate (MEDLINE KIT)  15 mL Mouth Rinse BID  . Chlorhexidine Gluconate Cloth  6 each Topical Q0600  . clonazePAM  0.5 mg Per Tube BID  . cloNIDine  0.2 mg Per Tube BID  . enoxaparin (LOVENOX) injection  40 mg Subcutaneous Q24H  . feeding supplement (PROSource TF)  45 mL Per Tube TID  . folic acid  1 mg Per Tube Daily  . Gerhardt's butt cream   Topical BID  . insulin aspart  0-9 Units Subcutaneous Q4H  . mouth rinse  15 mL Mouth Rinse BID  . mupirocin ointment   Nasal Daily  . pantoprazole (PROTONIX) IV  40 mg Intravenous Q24H  . polyethylene glycol  17 g Per Tube Daily  . QUEtiapine  25 mg Per Tube QHS  . thiamine  100 mg Per Tube Daily   Or  . thiamine  100 mg Intravenous Daily  . valproic acid  500 mg Per Tube BID   Continuous Infusions: . sodium chloride 40 mL/hr (02/15/20 0900)  . feeding supplement (JEVITY 1.2 CAL) 1,000 mL (02/22/20 1634)     LOS: 28 days   Time spent: 74mn  PDomenic Polite MD [facility]  02/23/2020, 12:22 PM

## 2020-02-24 ENCOUNTER — Inpatient Hospital Stay (HOSPITAL_COMMUNITY): Payer: Medicaid Other

## 2020-02-24 DIAGNOSIS — Z978 Presence of other specified devices: Secondary | ICD-10-CM | POA: Diagnosis not present

## 2020-02-24 DIAGNOSIS — L03115 Cellulitis of right lower limb: Secondary | ICD-10-CM | POA: Diagnosis not present

## 2020-02-24 DIAGNOSIS — A419 Sepsis, unspecified organism: Secondary | ICD-10-CM | POA: Diagnosis not present

## 2020-02-24 DIAGNOSIS — J9601 Acute respiratory failure with hypoxia: Secondary | ICD-10-CM | POA: Diagnosis not present

## 2020-02-24 LAB — CBC WITH DIFFERENTIAL/PLATELET
Abs Immature Granulocytes: 0.03 10*3/uL (ref 0.00–0.07)
Basophils Absolute: 0.1 10*3/uL (ref 0.0–0.1)
Basophils Relative: 1 %
Eosinophils Absolute: 0.2 10*3/uL (ref 0.0–0.5)
Eosinophils Relative: 3 %
HCT: 49.7 % — ABNORMAL HIGH (ref 36.0–46.0)
Hemoglobin: 15.1 g/dL — ABNORMAL HIGH (ref 12.0–15.0)
Immature Granulocytes: 0 %
Lymphocytes Relative: 36 %
Lymphs Abs: 3.3 10*3/uL (ref 0.7–4.0)
MCH: 27.8 pg (ref 26.0–34.0)
MCHC: 30.4 g/dL (ref 30.0–36.0)
MCV: 91.5 fL (ref 80.0–100.0)
Monocytes Absolute: 0.5 10*3/uL (ref 0.1–1.0)
Monocytes Relative: 5 %
Neutro Abs: 5 10*3/uL (ref 1.7–7.7)
Neutrophils Relative %: 55 %
Platelets: 184 10*3/uL (ref 150–400)
RBC: 5.43 MIL/uL — ABNORMAL HIGH (ref 3.87–5.11)
RDW: 14.1 % (ref 11.5–15.5)
WBC: 9.2 10*3/uL (ref 4.0–10.5)
nRBC: 0 % (ref 0.0–0.2)

## 2020-02-24 LAB — GLUCOSE, CAPILLARY
Glucose-Capillary: 146 mg/dL — ABNORMAL HIGH (ref 70–99)
Glucose-Capillary: 150 mg/dL — ABNORMAL HIGH (ref 70–99)
Glucose-Capillary: 158 mg/dL — ABNORMAL HIGH (ref 70–99)
Glucose-Capillary: 163 mg/dL — ABNORMAL HIGH (ref 70–99)
Glucose-Capillary: 167 mg/dL — ABNORMAL HIGH (ref 70–99)
Glucose-Capillary: 171 mg/dL — ABNORMAL HIGH (ref 70–99)
Glucose-Capillary: 181 mg/dL — ABNORMAL HIGH (ref 70–99)

## 2020-02-24 MED ORDER — RESOURCE THICKENUP CLEAR PO POWD
ORAL | Status: DC | PRN
  Filled 2020-02-24: qty 125

## 2020-02-24 NOTE — Progress Notes (Signed)
Modified Barium Swallow Progress Note  Patient Details  Name: [name] [name] MRN: [id number] Date of Birth: December 28, 1955  Today's Date: 02/24/2020  Modified Barium Swallow completed.  Full report located under Chart Review in the Imaging Section.  Brief recommendations include the following:  Clinical Impression  Swallow function has improved from FEES on 10/1 and recommend pt initiate puree (Dys 1) texture, nectar thick liquids. Her lingual propulsion is delayed with pumping and decreased cohesion. Increased duration of mastication and transit with soft cereal bar texture. Barium hesitated longer than normal in her pyriform sinuses with thin and nectar. There was questionable slight penetration over interarytenoid space on 2 occasions after large sip and reflexive immediate cough that was difficult to fully view. Given pt's cognitive status and impulsivty, recommend she initiate nectar thick (upgrade at bedside), Dys 1 (puree) with potential upgrade when daughter asked to bring dentures. Straws allowed, crushed meds and full assist.     Swallow Evaluation Recommendations       SLP Diet Recommendations: Nectar thick liquid;Dysphagia 1 (Puree) solids   Liquid Administration via: Cup;Straw   Medication Administration: Crushed with puree   Supervision: Patient able to self feed;Full supervision/cueing for compensatory strategies;Staff to assist with self feeding   Compensations: Minimize environmental distractions;Slow rate;Small sips/bites   Postural Changes: Seated upright at 90 degrees   Oral Care Recommendations: Oral care BID        Houston Siren 02/24/2020,2:55 PM   Orbie Pyo [place].Ed Risk analyst [phone] Office [phone]

## 2020-02-24 NOTE — Plan of Care (Signed)
  Problem: Clinical Measurements: Goal: Diagnostic test results will improve Outcome: Progressing   Problem: Activity: Goal: Risk for activity intolerance will decrease Outcome: Progressing   

## 2020-02-24 NOTE — Progress Notes (Signed)
PROGRESS NOTE    [name] [name]  FBX:[id number] DOB: 1955-10-20 DOA: 01/26/2020 PCP: Leonard Downing, MD  Brief Narrative: 64 year old female with history of heavy tobacco abuse, severe COPD/chronic respiratory failure on 4 L home O2, polysubstance abuse, type 2 diabetes mellitus, obesity, alcohol abuse, atrial fibrillation was admitted to [facility] on 9/5 with worsening pain swelling and drainage from her right foot, second toe and multiple ulcers on her lower leg along with fevers and chills. -She was found to have sepsis and group A strep bacteremia from cellulitis and mild fasciitis of right lower extremity, underwent debridement of epidermis and dermis of second toe on 9/16 by Dr. Constance Haw. [facility] course complicated by respiratory failure requiring mechanical ventilation on 9/7, she was extubated on 9/17 and then required reintubation due to stridor/respiratory distress, on 9/19 she was transferred to [facility], she was also followed by palliative care due to overall poor prognosis, eventually extubated on 9/23, has been on IV antibiotics for 24 days, finally discontinued on 9/29 -Transferred from PCCM to [facility] service  9/30 -Now with dysphagia, has a cortrak getting tube feeds  Assessment & Plan:     Acute respiratory failure with hypoxia and hypercapnia ([place]) -Secondary to pulmonary edema, sepsis, A. fib RVR, COPD exacerbation and mucous plugging -Required prolonged mechanical ventilation from 9/7, extubated and reintubated on 9/17 and finally extubated on 9/23, now DNR -Treated with diuretics also underwent multiple therapeutic bronchoscopies -Treated with multiple antibiotic courses, most recently on IV meropenem, stopped on 9/29 -Appears euvolemic at this time, will use Lasix as needed -Also had stridor in the ICU, was on IV Decadron for vocal cord edema, now off -Now more stable and improved from a pulmonary standpoint however at risk of decompensation    -Plan for SLP reevaluation today, hopefully she can start p.o.  -Plan for SNF with palliative care if stable and able to eat later this week   Sepsis, poa Group B strep bacteremia -Right lower extremity ulcer, cellulitis -Underwent debridement on 9/16 -2D echocardiogram was unremarkable on 9/8, repeat blood cultures were negative -Orthopedics consulted, right below-knee amputation was being considered, amputation deferred at this time -Antibiotics discontinued 9/29 by PCCM, has been on multiple different antibiotics for 24 days now, initially Rocephin and vancomycin from 9/5 and then vancomycin from 9/5 through 915 and then IV Ancef from 9/15-9/21, followed by IV meropenem from 9/27-9/29 -Continue local wound care, follow-up with orthopedics after discharge -Palliative following as well  Transient A. fib with RVR -No further recurrence, systemic anticoagulation on hold  Severe COPD/chronic respiratory failure -On 4 L home O2 at baseline -High risk of  decompensation  Dysphagia -Currently on tube feeds via cortrak, palliative following, anticipate some improvement in dysphagia to allow p.o. -SLP following, s/p for MBS/F EES 10/1, vocal cords still edematous and silent aspiration noted, treated with IV Decadron, -Plan for MBS today -No plans for PEG tube placement -Palliative following   Anxiety, depression, mood disorder -Resumed Cogentin, Depakote -On low-dose Klonopin now -Change Seroquel to nightly  Type 2 diabetes mellitus -Last hemoglobin A1c was 5.8 -Continue sliding scale insulin  DVT prophylaxis: Lovenox Code Status: DNR Family Communication: Discussed with daughter at bedside Disposition Plan:  Status is: Inpatient  Remains inpatient appropriate because:Inpatient level of care appropriate due to severity of illness   Dispo: The patient is from: Home              Anticipated d/c is to: SNF  Anticipated d/c date is: > 3 days              Patient  currently is not medically stable to d/c.  Consultants:   Palliative care, PCCM, general surgery, orthopedics Dr. Sharol Given   Procedures: Bedside debridement 9/16  Antimicrobials:    Subjective: -Feels okay now, anxious to be able to eat, no events overnight  Objective: Vitals:   02/23/20 1710 02/23/20 2026 02/24/20 0340 02/24/20 1040  BP: 132/74 (!) 151/80 134/71 119/83  Pulse: 96 93 78 (!) 103  Resp: '18 16 16 18  ' Temp: 98.3 F (36.8 C) 98.9 F (37.2 C) 97.7 F (36.5 C) 97.6 F (36.4 C)  TempSrc:  Oral Oral Oral  SpO2: 95% (!) 89% 95% 94%  Weight:      Height:        Intake/Output Summary (Last 24 hours) at 02/24/2020 1321 Last data filed at 02/24/2020 1300 Gross per 24 hour  Intake 250 ml  Output 1800 ml  Net -1550 ml   Filed Weights   02/20/20 2119 02/22/20 0648 02/22/20 2017  Weight: 89.9 kg 90.2 kg 88.5 kg    Examination:  General exam: Chronically ill obese female laying in bed, AAOx3, slight dysphonia noted HEENT: No JVD, NG tube with tube feeds infusing CVS: S1-S2, regular rate rhythm Lungs: Poor air movement bilaterally, decreased breath sounds the bases, otherwise clear Abdomen: Soft, nontender, less distended, bowel sounds present Extremities: No edema, multiple wounds on right lower leg and wound on her second toe of right foot Skin: As above Psychiatry:  Mood & affect appropriate.     Data Reviewed:   CBC: Recent Labs  Lab 02/20/20 0513 02/21/20 0907 02/22/20 1035 02/23/20 0233 02/24/20 0731  WBC 6.0 6.3 7.3 7.7 9.2  NEUTROABS 2.8 3.2 3.9 4.2 5.0  HGB 14.4 14.1 15.5* 14.3 15.1*  HCT 49.1* 47.8* 51.7* 47.5* 49.7*  MCV 95.5 92.6 90.5 90.3 91.5  PLT 193 199 188 182 974   Basic Metabolic Panel: Recent Labs  Lab 02/19/20 0935 02/20/20 0513 02/21/20 0907 02/22/20 0738 02/23/20 0233  NA 145 145 141 139 140  K 4.7 4.0 4.2 4.8 3.9  CL 98 101 95* 94* 94*  CO2 36* 39* 37* 31 36*  GLUCOSE 150* 147* 170* 131* 183*  BUN 26* '22 15 18 20    ' CREATININE 0.32* <0.30* <0.30* <0.30* 0.36*  CALCIUM 9.2 9.1 9.2 9.7 9.8  MG 1.9 1.9 1.6* 1.7  --    GFR: Estimated Creatinine Clearance: 79.1 mL/min (A) (by C-G formula based on SCr of 0.36 mg/dL (L)). Liver Function Tests: No results for input(s): AST, ALT, ALKPHOS, BILITOT, PROT, ALBUMIN in the last 168 hours. No results for input(s): LIPASE, AMYLASE in the last 168 hours. No results for input(s): AMMONIA in the last 168 hours. Coagulation Profile: No results for input(s): INR, PROTIME in the last 168 hours. Cardiac Enzymes: No results for input(s): CKTOTAL, CKMB, CKMBINDEX, TROPONINI in the last 168 hours. BNP (last 3 results) No results for input(s): PROBNP in the last 8760 hours. HbA1C: No results for input(s): HGBA1C in the last 72 hours. CBG: Recent Labs  Lab 02/23/20 2046 02/24/20 0036 02/24/20 0335 02/24/20 0733 02/24/20 1120  GLUCAP 169* 146* 150* 181* 167*   Lipid Profile: No results for input(s): CHOL, HDL, LDLCALC, TRIG, CHOLHDL, LDLDIRECT in the last 72 hours. Thyroid Function Tests: No results for input(s): TSH, T4TOTAL, FREET4, T3FREE, THYROIDAB in the last 72 hours. Anemia Panel: No results for input(s): VITAMINB12,  FOLATE, FERRITIN, TIBC, IRON, RETICCTPCT in the last 72 hours. Urine analysis:    Component Value Date/Time   COLORURINE YELLOW 02/01/2020 2040   APPEARANCEUR CLEAR 02/01/2020 2040   LABSPEC 1.031 (H) 02/01/2020 2040   PHURINE 5.0 02/01/2020 2040   GLUCOSEU NEGATIVE 02/01/2020 2040   HGBUR NEGATIVE 02/01/2020 2040   BILIRUBINUR NEGATIVE 02/01/2020 2040   KETONESUR NEGATIVE 02/01/2020 2040   PROTEINUR NEGATIVE 02/01/2020 2040   UROBILINOGEN 1.0 10/31/2012 1157   NITRITE NEGATIVE 02/01/2020 2040   LEUKOCYTESUR NEGATIVE 02/01/2020 2040   Sepsis Labs: '@LABRCNTIP' (procalcitonin:4,lacticidven:4)  )No results found for this or any previous visit (from the past 240 hour(s)).    Scheduled Meds: . benztropine  0.5 mg Per Tube BID  .  chlorhexidine gluconate (MEDLINE KIT)  15 mL Mouth Rinse BID  . Chlorhexidine Gluconate Cloth  6 each Topical Q0600  . clonazePAM  0.5 mg Per Tube BID  . cloNIDine  0.2 mg Per Tube BID  . enoxaparin (LOVENOX) injection  40 mg Subcutaneous Q24H  . feeding supplement (PROSource TF)  45 mL Per Tube TID  . folic acid  1 mg Per Tube Daily  . Gerhardt's butt cream   Topical BID  . insulin aspart  0-9 Units Subcutaneous Q4H  . mouth rinse  15 mL Mouth Rinse BID  . mupirocin ointment   Nasal Daily  . pantoprazole (PROTONIX) IV  40 mg Intravenous Q24H  . polyethylene glycol  17 g Per Tube Daily  . QUEtiapine  25 mg Per Tube QHS  . thiamine  100 mg Per Tube Daily   Or  . thiamine  100 mg Intravenous Daily  . valproic acid  500 mg Per Tube BID   Continuous Infusions: . sodium chloride 40 mL/hr (02/15/20 0900)  . feeding supplement (JEVITY 1.2 CAL) 1,000 mL (02/24/20 0038)     LOS: 29 days   Time spent: 74mn  PDomenic Polite MD [facility]  02/24/2020, 1:21 PM

## 2020-02-24 NOTE — Progress Notes (Signed)
  Speech Language Pathology :    Patient Details Name: [name] [name] MRN: [id number] DOB: March 13, 1956 Today's Date: 02/24/2020 Time:  -     MBS today at 1330.                 GO                Houston Siren 02/24/2020, 10:10 AM  Orbie Pyo [place].Ed Risk analyst [phone] Office [phone]

## 2020-02-24 NOTE — Progress Notes (Addendum)
Nutrition Follow-up  DOCUMENTATION CODES:   Not applicable  INTERVENTION:  Given limitations of pt's diet, recommend to continue tube feeding via Cortrak: Jevity 1.2 at 70 ml/h (1680 ml per day) Prosource TF 45 ml TID  Provides 2136 kcal, 126 gm protein, 1361 ml free water daily  Pt's current diet is very restrictive. Recommend continuing to meet 100% of needs via TF until adequacy of po intake can be assessed. RD will monitor for adequacy of PO intake over the next several days and make recommendations regarding reduction/discontinuation of tube feeding. RD will also monitor for need for oral nutrition supplements  NUTRITION DIAGNOSIS:   Increased nutrient needs related to post-op healing as evidenced by estimated needs.  Ongoing  GOAL:   Patient will meet greater than or equal to 90% of their needs  Met with TF  MONITOR:   TF tolerance, Diet advancement, Labs, Skin  REASON FOR ASSESSMENT:   Ventilator    ASSESSMENT:   Patient with PMH significant for COPD, DM, HTN, and bipolar disorder. Presents to [facility] with R toe cellulitis and acute pulmonary edema. Transferred to [facility] for possible trach placement.  9/23 extubated 9/27 cortrak placed (gastric)  Pt is back on home 2L O2.  Dr. Sharol Given met with pt and noted that pt's wound is healing -- no plans for amputation at this time.   Per MD, pt to d/c to SNF with Palliative Care if pt is able to eat this week.   Pt had MBS today. SLP recommending pt begin Dysphagia 2 diet with nectar thick liquids. Note that this diet is restrictive, concerned pt may not eat enough to meet her increased nutrition needs for healing. Recommend continuing TF at current rate to assess how pt does with po diet. RD will monitor for adequacy of po intake and will make recommendations to reduce/discontinue TF as po intake improves and will order supplements as appropriate.  Current TF via Cortrak: Jevity 1.2 cal @ 23m/hr, 454mProsource  TF TID. Provides 2136 kcal, 126 gm protein, 1361 ml free water daily. Meets 100% of needs.   UOP: 160023m24 hours I/O: +16361.4ml60mnce admit  Current wt: 88.5 kg Admit wt: 99.8 kg  Labs: CBGs 150-181-167 Medications: folvite, novolog, protonix, miralax, thiamine  Diet Order:   Diet Order            DIET DYS 2 Room service appropriate? No; Fluid consistency: Nectar Thick  Diet effective now                 EDUCATION NEEDS:   No education needs have been identified at this time  Skin:  Skin Assessment: Skin Integrity Issues: Skin Integrity Issues:: Other (Comment) Other: non pressure wound R pretibial; cellutitis 2nd toe right foot  Last BM:  10/3  Height:   Ht Readings from Last 1 Encounters:  02/19/20 '5\' 5"'  (1.651 m)    Weight:   Wt Readings from Last 1 Encounters:  02/22/20 88.5 kg    Ideal Body Weight:  56.8 kg  BMI:  Body mass index is 32.47 kg/m.  Estimated Nutritional Needs:   Kcal:  2000-2200  Protein:  110-140 gm  Fluid:  >/= 2 L/day    AmanLarkin Ina, RD, LDN RD pager number and weekend/on-call pager number located in [facility]

## 2020-02-24 NOTE — Progress Notes (Signed)
Physical Therapy Treatment Patient Details Name: [name] [name] MRN: [id number] DOB: 1955/08/04 Today's Date: 02/24/2020    History of Present Illness 64 yo admitted 6/5 with RLE wound, sepsis due to cellulitis. Pt with Afib with RVR and required intubation (9/7-9/17, 9/17-9/23). Transfer to [facility] 9/19 for possible trach and potential amputation. As of 9/28 amputation currently being avoided. PMHx: smoker, COPD on home O2, bipolar, DM, HTN    PT Comments    The pt continues to be eager to participate in therapy and was able to significantly progress her mobility during today's session. The pt was able to progress from needing assist of 2 to complete all OOB mobility, to min/modA of a single person. The pt continues to benefit from [facility] for hand positioning and assist to generate forward momentum for transitions, but she was able to better support herself in standing and tolerate improved wt bearing on her RLE. The pt will continue to benefit from skilled PT in order to progress to her prior level of function and mobility as she is currently limited to 3-5 min of walking and 2-3 min of standing with BUE support and minA prior to needing seated rest. HR increased to ~135bpm with continued standing or pivot transfers, SpO2 remained >89% on 2L O2.     Follow Up Recommendations  Home health PT;Supervision/Assistance - 24 hour     Equipment Recommendations  Rolling walker with 5" wheels;3in1 (PT)    Recommendations for Other Services       Precautions / Restrictions Precautions Precautions: Fall Precaution Comments: cortrak, RLE wounds Restrictions Weight Bearing Restrictions: No RLE Weight Bearing: Weight bearing as tolerated    Mobility  Bed Mobility Overal bed mobility: Needs Assistance Bed Mobility: Supine to Sit     Supine to sit: Min guard;HOB elevated     General bed mobility comments: pt able to come to sitting EOB without physical assist, cues to scoot to EOB with feet  supported  Transfers Overall transfer level: Needs assistance Equipment used: Rolling walker (2 wheeled) Transfers: Sit to/from [facility] Sit to Stand: Min assist;+2 physical assistance;From elevated surface (progressed to minA of 1) Stand pivot transfers: Min assist;+2 safety/equipment (progressed to minA of 1)       General transfer comment: initial stand with minA of 2, progresed to minA of 1 within session (completed x10 through session), minA for pivot to recliner then BSC, minA to steady and facilitate movement of RW  Ambulation/Gait Ambulation/Gait assistance: Min assist Gait Distance (Feet): 4 Feet (x4) Assistive device: Rolling walker (2 wheeled) Gait Pattern/deviations: Step-to pattern;Decreased stride length;Leaning posteriorly;Trunk flexed Gait velocity: decreased Gait velocity interpretation: <1.31 ft/sec, indicative of household ambulator General Gait Details: short lateral steps to recliner and then to [facility] and back to recliner. poor wt acceptance RLE but improved from last session     Balance Overall balance assessment: Needs assistance Sitting-balance support: Bilateral upper extremity supported;Feet supported Sitting balance-Leahy Scale: Fair Sitting balance - Comments: minA initially, progressed to close minG   Standing balance support: Bilateral upper extremity supported Standing balance-Leahy Scale: Poor Standing balance comment: BUE support and minA                            Cognition Arousal/Alertness: Awake/alert Behavior During Therapy: WFL for tasks assessed/performed Overall Cognitive Status: Impaired/Different from baseline Area of Impairment: Memory;Safety/judgement;Problem solving  Memory: Decreased recall of precautions;Decreased short-term memory Following Commands: Follows one step commands with increased time;Follows multi-step commands inconsistently Safety/Judgement: Decreased  awareness of safety   Problem Solving: Slow processing;Decreased initiation;Difficulty sequencing;Requires tactile cues;Requires verbal cues General Comments: pt following commands, but needing increased cues for sequencing and safety      Exercises General Exercises - Lower Extremity Ankle Circles/Pumps: AROM;Both;10 reps;Supine Long Arc Quad: AROM;Both;10 reps;Seated Heel Slides: AROM;Strengthening;Both;10 reps;Supine (AROM RLE, against min resistance LLE)    General Comments General comments (skin integrity, edema, etc.): dressing on RLE intact      Pertinent Vitals/Pain Pain Assessment: Faces Faces Pain Scale: Hurts a little bit Pain Location: RLE Pain Descriptors / Indicators: Sore Pain Intervention(s): Monitored during session;Limited activity within patient's tolerance;Repositioned           PT Goals (current goals can now be found in the care plan section) Acute Rehab PT Goals Patient Stated Goal: regain strength and return home PT Goal Formulation: With family Time For Goal Achievement: 03/04/20 Potential to Achieve Goals: Fair Progress towards PT goals: Progressing toward goals    Frequency    Min 3X/week      PT Plan Discharge plan needs to be updated       AM-PAC PT "6 Clicks" Mobility   Outcome Measure  Help needed turning from your back to your side while in a flat bed without using bedrails?: A Little Help needed moving from lying on your back to sitting on the side of a flat bed without using bedrails?: A Little Help needed moving to and from a bed to a chair (including a wheelchair)?: A Little Help needed standing up from a chair using your arms (e.g., wheelchair or bedside chair)?: A Little Help needed to walk in hospital room?: A Lot Help needed climbing 3-5 steps with a railing? : Total 6 Click Score: 15    End of Session Equipment Utilized During Treatment: Gait belt Activity Tolerance: Patient tolerated treatment well Patient left: in  chair;with chair alarm set;with call bell/phone within reach;with family/visitor present Nurse Communication: Mobility status PT Visit Diagnosis: Other abnormalities of gait and mobility (R26.89);Difficulty in walking, not elsewhere classified (R26.2);Muscle weakness (generalized) (M62.81)     Time: 9675-9163 PT Time Calculation (min) (ACUTE ONLY): 51 min  Charges:  $Gait Training: 23-37 mins $Therapeutic Activity: 8-22 mins                     Karma Ganja, PT, DPT   Acute Rehabilitation Department Pager #: [phone]   Otho Bellows 02/24/2020, 11:44 AM

## 2020-02-25 DIAGNOSIS — A419 Sepsis, unspecified organism: Secondary | ICD-10-CM | POA: Diagnosis not present

## 2020-02-25 DIAGNOSIS — J9601 Acute respiratory failure with hypoxia: Secondary | ICD-10-CM | POA: Diagnosis not present

## 2020-02-25 DIAGNOSIS — L03115 Cellulitis of right lower limb: Secondary | ICD-10-CM | POA: Diagnosis not present

## 2020-02-25 DIAGNOSIS — Z978 Presence of other specified devices: Secondary | ICD-10-CM | POA: Diagnosis not present

## 2020-02-25 LAB — CBC
HCT: 46.6 % — ABNORMAL HIGH (ref 36.0–46.0)
Hemoglobin: 14.4 g/dL (ref 12.0–15.0)
MCH: 28.3 pg (ref 26.0–34.0)
MCHC: 30.9 g/dL (ref 30.0–36.0)
MCV: 91.7 fL (ref 80.0–100.0)
Platelets: 159 10*3/uL (ref 150–400)
RBC: 5.08 MIL/uL (ref 3.87–5.11)
RDW: 14.1 % (ref 11.5–15.5)
WBC: 10.4 10*3/uL (ref 4.0–10.5)
nRBC: 0 % (ref 0.0–0.2)

## 2020-02-25 LAB — BASIC METABOLIC PANEL
Anion gap: 10 (ref 5–15)
BUN: 27 mg/dL — ABNORMAL HIGH (ref 8–23)
CO2: 36 mmol/L — ABNORMAL HIGH (ref 22–32)
Calcium: 9.6 mg/dL (ref 8.9–10.3)
Chloride: 95 mmol/L — ABNORMAL LOW (ref 98–111)
Creatinine, Ser: <0.3 mg/dL — ABNORMAL LOW (ref 0.44–1.00)
Glucose, Bld: 175 mg/dL — ABNORMAL HIGH (ref 70–99)
Potassium: 4 mmol/L (ref 3.5–5.1)
Sodium: 141 mmol/L (ref 135–145)

## 2020-02-25 LAB — GLUCOSE, CAPILLARY
Glucose-Capillary: 171 mg/dL — ABNORMAL HIGH (ref 70–99)
Glucose-Capillary: 204 mg/dL — ABNORMAL HIGH (ref 70–99)
Glucose-Capillary: 156 mg/dL — ABNORMAL HIGH (ref 70–99)
Glucose-Capillary: 170 mg/dL — ABNORMAL HIGH (ref 70–99)
Glucose-Capillary: 115 mg/dL — ABNORMAL HIGH (ref 70–99)
Glucose-Capillary: 129 mg/dL — ABNORMAL HIGH (ref 70–99)

## 2020-02-25 MED ORDER — PANTOPRAZOLE SODIUM 40 MG PO PACK
40.0000 mg | PACK | Freq: Every day | ORAL | Status: DC
  Administered 2020-02-25 – 2020-02-26 (×2): 40 mg
  Filled 2020-02-25 (×2): qty 20

## 2020-02-25 NOTE — Progress Notes (Addendum)
PROGRESS NOTE    [name] [name]  OIN:[id number] DOB: 10-Jan-1956 DOA: 01/26/2020 PCP: Leonard Downing, MD  Brief Narrative: 64 year old female with history of heavy tobacco abuse, severe COPD/chronic respiratory failure on 4 L home O2, polysubstance abuse, type 2 diabetes mellitus, obesity, alcohol abuse, atrial fibrillation was admitted to [facility] on 9/5 with worsening pain swelling and drainage from her right foot, second toe and multiple ulcers on her lower leg along with fevers and chills. -She was found to have sepsis and group A strep bacteremia from cellulitis and mild fasciitis of right lower extremity, underwent debridement of epidermis and dermis of second toe on 9/16 by Dr. Constance Haw. [facility] course complicated by respiratory failure requiring mechanical ventilation on 9/7, she was extubated on 9/17 and then required reintubation due to stridor/respiratory distress, on 9/19 she was transferred to [facility], she was also followed by palliative care due to overall poor prognosis, eventually extubated on 9/23, has been on IV antibiotics for 24 days, finally discontinued on 9/29 -Transferred from PCCM to [facility] service  7/09 -complicated by severe dysphagia, >cortrak getting tube feeds -starting Po now  Assessment & Plan:     Acute respiratory failure with hypoxia and hypercapnia ([place]) -Secondary to pulmonary edema, sepsis, A. fib RVR, COPD exacerbation and mucous plugging -Required prolonged mechanical ventilation from 9/7, extubated and reintubated on 9/17 and finally extubated on 9/23, now DNR -Treated with diuretics also underwent multiple therapeutic bronchoscopies -Treated with multiple antibiotic courses, most recently on IV meropenem, stopped on 9/29 -Appears euvolemic at this time, will use Lasix as needed -Also had stridor in the ICU, was on IV Decadron for vocal cord edema, now off -Now more stable and improved from a pulmonary standpoint however at  risk of decompensation  -Starting dysphagia 1, nectar thick liquids, stop tube feeds and monitor oral intake, if adequate discontinue cortrak tomorrow  Sepsis, poa Group B strep bacteremia -Right lower extremity ulcer, cellulitis -Underwent debridement on 9/16 -2D echocardiogram was unremarkable on 9/8, repeat blood cultures were negative -Orthopedics consulted, right below-knee amputation was being considered while pt was in ICU, amputation deferred at this time -Antibiotics discontinued 9/29 by PCCM, has been on multiple different antibiotics for 24 days now, initially Rocephin and vancomycin from 9/5 and then vancomycin from 9/5 through 915 and then IV Ancef from 9/15-9/21, followed by IV meropenem from 9/27-9/29 -Continue local wound care, d/w Dr.Duda, he recommended FU in office in few weeks and local wound care -Palliative following as well  Transient A. fib with RVR -No further recurrence, systemic anticoagulation on hold  Severe COPD/chronic respiratory failure -On 4 L home O2 at baseline -stable now  Dysphagia -Noted to have severe dysphagia in ICU, cortrak placed, tube feeds started and transferred to the floor -SLP following, s/p for MBS/F EES 10/1, vocal cords still edematous and silent aspiration noted, treated with IV Decadron, n.p.o. recommended -Underwent repeat MBS 10/4 afternoon, clinically improving, recommended dysphagia 1 diet with nectar thick liquids -Starting dysphagia 1, nectar thick liquids, stop tube feeds and monitor oral intake, if adequate discontinue cortrak tomorrow  Anxiety, depression, mood disorder -Resumed Cogentin, Depakote -On low-dose Klonopin now -Change Seroquel to nightly  Type 2 diabetes mellitus -Last hemoglobin A1c was 5.8 -Continue sliding scale insulin  DVT prophylaxis: Lovenox Code Status: DNR Family Communication: Discussed with daughter at bedside Disposition Plan:  Status is: Inpatient  Remains inpatient appropriate  because:Inpatient level of care appropriate due to severity of illness   Dispo: The patient  is from: Home              Anticipated d/c is to: Home with home health services, reportedly does not have SNF/rehab benefits              Anticipated d/c date is: Likely 2 days              Patient currently is not medically stable to d/c.  Consultants:   Palliative care, PCCM, general surgery, orthopedics Dr. Sharol Given   Procedures: Bedside debridement 9/16  Antimicrobials:    Subjective: -No events overnight, tolerated 1 meal of dysphagia 1 diet last night -Breathing okay overall  Objective: Vitals:   02/24/20 1626 02/24/20 2042 02/25/20 0501 02/25/20 0941  BP: 121/70 (!) 149/87 140/71 (!) 142/84  Pulse: 96 91 93 (!) 102  Resp: _0 Temp: 98.9 F (37.2 C) 97.8 F (36.6 C) 98 F (36.7 C) 98.3 F (36.8 C)  TempSrc: Oral Oral Oral Oral  SpO2: 94% 92% 95% 94%  Weight:      Height:        Intake/Output Summary (Last 24 hours) at 02/25/2020 1340 Last data filed at 02/25/2020 1300 Gross per 24 hour  Intake 3057 ml  Output 500 ml  Net 2557 ml   Filed Weights   02/20/20 2119 02/22/20 0648 02/22/20 2017  Weight: 89.9 kg 90.2 kg 88.5 kg    Examination:  General exam: Chronically ill obese female laying in bed, AAOx3, dysphonia improving, no distress HEENT: No JVD, NG tube with tube feeds infusing CVS: S1-S2, regular rate rhythm Lungs: Improved air movement bilaterally, decreased at the bases otherwise clear Abdomen: Soft, nontender, less distended, bowel sounds present  Extremities: No edema, multiple wounds on right lower leg and wound on her second toe of right foot Skin: As above Psychiatry:  Mood & affect appropriate.     Data Reviewed:   CBC: Recent Labs  Lab 02/20/20 0513 02/20/20 0513 02/21/20 0907 02/22/20 1035 02/23/20 0233 02/24/20 0731 02/25/20 0445  WBC 6.0   < > 6.3 7.3 7.7 9.2 10.4  NEUTROABS 2.8  --  3.2 3.9 4.2 5.0  --   HGB 14.4   < > 14.1  15.5* 14.3 15.1* 14.4  HCT 49.1*   < > 47.8* 51.7* 47.5* 49.7* 46.6*  MCV 95.5   < > 92.6 90.5 90.3 91.5 91.7  PLT 193   < > 199 188 182 184 159   < > = values in this interval not displayed.   Basic Metabolic Panel: Recent Labs  Lab 02/19/20 0935 02/19/20 0935 02/20/20 0513 02/21/20 0907 02/22/20 0738 02/23/20 0233 02/25/20 0445  NA 145   < > 145 141 139 140 141  K 4.7   < > 4.0 4.2 4.8 3.9 4.0  CL 98   < > 101 95* 94* 94* 95*  CO2 36*   < > 39* 37* 31 36* 36*  GLUCOSE 150*   < > 147* 170* 131* 183* 175*  BUN 26*   < > _1 27*  CREATININE 0.32*   < > <0.30* <0.30* <0.30* 0.36* <0.30*  CALCIUM 9.2   < > 9.1 9.2 9.7 9.8 9.6  MG 1.9  --  1.9 1.6* 1.7  --   --    < > = values in this interval not displayed.   GFR: CrCl cannot be calculated (This lab value cannot be used to calculate CrCl because it is not a number: <0.30).  Liver Function Tests: No results for input(s): AST, ALT, ALKPHOS, BILITOT, PROT, ALBUMIN in the last 168 hours. No results for input(s): LIPASE, AMYLASE in the last 168 hours. No results for input(s): AMMONIA in the last 168 hours. Coagulation Profile: No results for input(s): INR, PROTIME in the last 168 hours. Cardiac Enzymes: No results for input(s): CKTOTAL, CKMB, CKMBINDEX, TROPONINI in the last 168 hours. BNP (last 3 results) No results for input(s): PROBNP in the last 8760 hours. HbA1C: No results for input(s): HGBA1C in the last 72 hours. CBG: Recent Labs  Lab 02/24/20 2014 02/24/20 2350 02/25/20 0439 02/25/20 0812 02/25/20 1141  GLUCAP 158* 171* 171* 204* 156*   Lipid Profile: No results for input(s): CHOL, HDL, LDLCALC, TRIG, CHOLHDL, LDLDIRECT in the last 72 hours. Thyroid Function Tests: No results for input(s): TSH, T4TOTAL, FREET4, T3FREE, THYROIDAB in the last 72 hours. Anemia Panel: No results for input(s): VITAMINB12, FOLATE, FERRITIN, TIBC, IRON, RETICCTPCT in the last 72 hours. Urine analysis:    Component Value  Date/Time   COLORURINE YELLOW 02/01/2020 2040   APPEARANCEUR CLEAR 02/01/2020 2040   LABSPEC 1.031 (H) 02/01/2020 2040   PHURINE 5.0 02/01/2020 2040   GLUCOSEU NEGATIVE 02/01/2020 2040   HGBUR NEGATIVE 02/01/2020 2040   BILIRUBINUR NEGATIVE 02/01/2020 2040   KETONESUR NEGATIVE 02/01/2020 2040   PROTEINUR NEGATIVE 02/01/2020 2040   UROBILINOGEN 1.0 10/31/2012 1157   NITRITE NEGATIVE 02/01/2020 2040   LEUKOCYTESUR NEGATIVE 02/01/2020 2040   Sepsis Labs: _0 (procalcitonin:4,lacticidven:4)  )No results found for this or any previous visit (from the past 240 hour(s)).    Scheduled Meds: . benztropine  0.5 mg Per Tube BID  . chlorhexidine gluconate (MEDLINE KIT)  15 mL Mouth Rinse BID  . Chlorhexidine Gluconate Cloth  6 each Topical Q0600  . clonazePAM  0.5 mg Per Tube BID  . cloNIDine  0.2 mg Per Tube BID  . enoxaparin (LOVENOX) injection  40 mg Subcutaneous Q24H  . folic acid  1 mg Per Tube Daily  . Gerhardt's butt cream   Topical BID  . insulin aspart  0-9 Units Subcutaneous Q4H  . mouth rinse  15 mL Mouth Rinse BID  . mupirocin ointment   Nasal Daily  . pantoprazole sodium  40 mg Per Tube Daily  . polyethylene glycol  17 g Per Tube Daily  . QUEtiapine  25 mg Per Tube QHS  . thiamine  100 mg Per Tube Daily  . valproic acid  500 mg Per Tube BID   Continuous Infusions: . sodium chloride 40 mL/hr (02/15/20 0900)     LOS: 30 days   Time spent: 58mn  PDomenic Polite MD [facility]  02/25/2020, 1:40 PM

## 2020-02-25 NOTE — Progress Notes (Addendum)
Nutrition Follow-up  DOCUMENTATION CODES:   Not applicable  INTERVENTION:   Vital Cuisine shakes po BID, each supplement provides 500 kcal and 22 grams of protein  Mighty Shake po daily, each supplement provides 330 kcal and 9 grams of protein  Magic cup TID with meals, each supplement provides 290 kcal and 9 grams of protein  Recommend d/c Jevity 1.2 cal  Recommend d/c Prosource TF  Recommend leaving Cortrak in place for next 24 hours to monitor adequacy of po intake and acceptance of oral nutrition supplements. If pt continues to have sufficient PO intake today, RN may remove Cortrak tomorrow.    NUTRITION DIAGNOSIS:   Increased nutrient needs related to post-op healing as evidenced by estimated needs.  Ongoing  GOAL:   Patient will meet greater than or equal to 90% of their needs  Met with TF; will now address with oral nutrition supplements  MONITOR:   TF tolerance, Diet advancement, Labs, Skin  REASON FOR ASSESSMENT:   Ventilator    ASSESSMENT:   Patient with PMH significant for COPD, DM, HTN, and bipolar disorder. Presents to [facility] with R toe cellulitis and acute pulmonary edema. Transferred to [facility] for possible trach placement.  9/23 extubated 9/27 cortrak placed (gastric) 10/4 s/p MBS, diet advanced to dysphagia 2/nectar thick liquids   Pt currently receiving the following via Cortrak: Jevity 1.2 cal @ 4m/hr, 482mProsource TF TID. Provides2136kcal, 126gm protein, 136173mree water daily. Meets 100% of needs. Since having her diet advanced, pt has consumed 45-100% of her meals (65% average meal intake). Recommend discontinuing tube feeding. RD will order oral nutrition supplements to provide additional kcals/protein. Recommend leaving Cortrak in place for another 24 hours to monitor adequacy of po intake today and acceptance of supplements. Discussed plan with RN and sent secure chat to MD with recommendations.   Current wt: 88.5 kg (taken  02/22/20) Admit wt: 99.8 kg  UOP: 1100m56m4 hours I/O: +17,601.4ml 47mce admit  Labs: CBGs 171-204 Medications: folvite, novolog, protonix, miralax, thiamine  Diet Order:   Diet Order            DIET DYS 2 Room service appropriate? No; Fluid consistency: Nectar Thick  Diet effective now                 EDUCATION NEEDS:   No education needs have been identified at this time  Skin:  Skin Assessment: Skin Integrity Issues: Skin Integrity Issues:: Other (Comment) Other: non pressure wound R pretibial; cellutitis 2nd toe right foot  Last BM:  10/4  Height:   Ht Readings from Last 1 Encounters:  02/19/20 '5\' 5"'  (1.651 m)    Weight:   Wt Readings from Last 1 Encounters:  02/22/20 88.5 kg    Ideal Body Weight:  56.8 kg  BMI:  Body mass index is 32.47 kg/m.  Estimated Nutritional Needs:   Kcal:  2000-2200  Protein:  110-140 gm  Fluid:  >/= 2 L/day    AmandLarkin Ina RD, LDN RD pager number and weekend/on-call pager number located in [place]

## 2020-02-25 NOTE — Plan of Care (Signed)
  Problem: Nutrition: Goal: Adequate nutrition will be maintained Outcome: Progressing   

## 2020-02-25 NOTE — Progress Notes (Signed)
Physical Therapy Treatment Patient Details Name: Vernal Rutan MRN: [id number] DOB: [birth year] Today's Date: 02/25/2020    History of Present Illness 64 yo admitted 6/5 with RLE wound, sepsis due to cellulitis. Pt with Afib with RVR and required intubation (9/7-9/17, 9/17-9/23). Transfer to [facility] 9/19 for possible trach and potential amputation. As of 9/28 amputation currently being avoided. PMHx: smoker, COPD on home O2, bipolar, DM, HTN    PT Comments    Continuing work on functional mobility and activity tolerance;  Session focused on gait training and progressive ambulation; Able to incr amb distance well; Still, with multiple small losses of balance, requiring min physical assist for steadiness; MUST have someone beside her giving assist if she is standing and/or walking   Follow Up Recommendations  Home health PT;Supervision/Assistance - 24 hour;Other (comment) (HHAide/Personal care Attendant)     Equipment Recommendations  Rolling walker with 5" wheels;3in1 (PT);Wheelchair (measurements PT)    Recommendations for Other Services OT consult     Precautions / Restrictions Precautions Precautions: Fall Precaution Comments: cortrak, RLE wounds Restrictions RLE Weight Bearing: Weight bearing as tolerated    Mobility  Bed Mobility                  Transfers Overall transfer level: Needs assistance Equipment used: Rolling walker (2 wheeled) Transfers: Sit to/from Stand Sit to Stand: Min assist;+2 physical assistance;From elevated surface (progressed to minA of 1)         General transfer comment: Cues for hand placement and safety; Min assist at initial stand due to incr sway and small losses of balance posteriorly; cues for anterior wieght shift, and to "push" RW into the ground  Ambulation/Gait Ambulation/Gait assistance: Mod assist;+2 safety/equipment Gait Distance (Feet): 20 Feet Assistive device: Rolling walker (2 wheeled) Gait Pattern/deviations: Step-to  pattern;Decreased stride length;Leaning posteriorly;Trunk flexed Gait velocity: decreased   General Gait Details: Cues for gati sequence to maximize ability to [place] RLE in stance; Difficulty adhering to cues noted; a few losses of balance, requiring min assist to prevent fall; second person helpful for O2 mgmnt and chair push   Stairs             Wheelchair Mobility    Modified Rankin (Stroke Patients Only)       Balance     Sitting balance-Leahy Scale: Fair       Standing balance-Leahy Scale: Poor                              Cognition Arousal/Alertness: Awake/alert Behavior During Therapy: WFL for tasks assessed/performed Overall Cognitive Status: Impaired/Different from baseline Area of Impairment: Memory;Safety/judgement;Problem solving                     Memory: Decreased recall of precautions;Decreased short-term memory         General Comments: pt following commands, but needing increased cues for sequencing and safety      Exercises      General Comments        Pertinent Vitals/Pain Pain Assessment: Faces Faces Pain Scale: No hurt Pain Location: RLE Pain Intervention(s): Monitored during session    Home Living                      Prior Function            PT Goals (current goals can now be found in the care plan section) Acute Rehab PT  Goals Patient Stated Goal: regain strength and return home PT Goal Formulation: With family Time For Goal Achievement: 03/04/20 Potential to Achieve Goals: Fair Progress towards PT goals: Progressing toward goals    Frequency    Min 3X/week      PT Plan Current plan remains appropriate    Co-evaluation              AM-PAC PT "6 Clicks" Mobility   Outcome Measure  Help needed turning from your back to your side while in a flat bed without using bedrails?: A Little Help needed moving from lying on your back to sitting on the side of a flat bed without  using bedrails?: A Little Help needed moving to and from a bed to a chair (including a wheelchair)?: A Little Help needed standing up from a chair using your arms (e.g., wheelchair or bedside chair)?: A Little Help needed to walk in hospital room?: A Lot Help needed climbing 3-5 steps with a railing? : Total 6 Click Score: 15    End of Session Equipment Utilized During Treatment: Gait belt Activity Tolerance: Patient tolerated treatment well Patient left: in chair;with chair alarm set;with family/visitor present Nurse Communication: Mobility status PT Visit Diagnosis: Other abnormalities of gait and mobility (R26.89);Difficulty in walking, not elsewhere classified (R26.2);Muscle weakness (generalized) (M62.81)     Time: 2542-7062 PT Time Calculation (min) (ACUTE ONLY): 26 min  Charges:  $Gait Training: 8-22 mins $Therapeutic Activity: 8-22 mins                     Roney Marion, PT  Acute Rehabilitation Services Pager [phone] Office [place] 02/25/2020, 4:29 PM

## 2020-02-26 DIAGNOSIS — A419 Sepsis, unspecified organism: Secondary | ICD-10-CM | POA: Diagnosis not present

## 2020-02-26 LAB — CBC
HCT: 48.1 % — ABNORMAL HIGH (ref 36.0–46.0)
Hemoglobin: 14.8 g/dL (ref 12.0–15.0)
MCH: 28.1 pg (ref 26.0–34.0)
MCHC: 30.8 g/dL (ref 30.0–36.0)
MCV: 91.3 fL (ref 80.0–100.0)
Platelets: 155 10*3/uL (ref 150–400)
RBC: 5.27 MIL/uL — ABNORMAL HIGH (ref 3.87–5.11)
RDW: 14.3 % (ref 11.5–15.5)
WBC: 10 10*3/uL (ref 4.0–10.5)
nRBC: 0 % (ref 0.0–0.2)

## 2020-02-26 LAB — BASIC METABOLIC PANEL
Anion gap: 10 (ref 5–15)
BUN: 17 mg/dL (ref 8–23)
CO2: 38 mmol/L — ABNORMAL HIGH (ref 22–32)
Calcium: 9.5 mg/dL (ref 8.9–10.3)
Chloride: 93 mmol/L — ABNORMAL LOW (ref 98–111)
Creatinine, Ser: 0.31 mg/dL — ABNORMAL LOW (ref 0.44–1.00)
GFR calc non Af Amer: >60 mL/min (ref >60)
Glucose, Bld: 179 mg/dL — ABNORMAL HIGH (ref 70–99)
Potassium: 4.1 mmol/L (ref 3.5–5.1)
Sodium: 141 mmol/L (ref 135–145)

## 2020-02-26 LAB — GLUCOSE, CAPILLARY
Glucose-Capillary: 127 mg/dL — ABNORMAL HIGH (ref 70–99)
Glucose-Capillary: 141 mg/dL — ABNORMAL HIGH (ref 70–99)
Glucose-Capillary: 173 mg/dL — ABNORMAL HIGH (ref 70–99)
Glucose-Capillary: 181 mg/dL — ABNORMAL HIGH (ref 70–99)
Glucose-Capillary: 185 mg/dL — ABNORMAL HIGH (ref 70–99)
Glucose-Capillary: 277 mg/dL — ABNORMAL HIGH (ref 70–99)

## 2020-02-26 MED ORDER — UMECLIDINIUM BROMIDE 62.5 MCG/INH IN AEPB
1.0000 | INHALATION_SPRAY | Freq: Every day | RESPIRATORY_TRACT | Status: DC
  Administered 2020-02-27: 1 via RESPIRATORY_TRACT
  Filled 2020-02-26: qty 7

## 2020-02-26 MED ORDER — CLONIDINE HCL 0.2 MG PO TABS
0.2000 mg | ORAL_TABLET | Freq: Two times a day (BID) | ORAL | Status: DC
  Administered 2020-02-26 – 2020-02-27 (×2): 0.2 mg via ORAL
  Filled 2020-02-26 (×2): qty 1

## 2020-02-26 MED ORDER — CLONAZEPAM 0.5 MG PO TABS
0.5000 mg | ORAL_TABLET | Freq: Two times a day (BID) | ORAL | Status: DC
  Administered 2020-02-27: 0.5 mg via ORAL
  Filled 2020-02-26: qty 1

## 2020-02-26 MED ORDER — CLONAZEPAM 0.25 MG PO TBDP
0.2500 mg | ORAL_TABLET | Freq: Once | ORAL | Status: AC
  Administered 2020-02-26: 0.25 mg via ORAL
  Filled 2020-02-26: qty 1

## 2020-02-26 MED ORDER — ACETAMINOPHEN 650 MG RE SUPP: 650.0000 mg | Freq: Four times a day (QID) | RECTAL | Status: DC | PRN

## 2020-02-26 MED ORDER — ACETAMINOPHEN 160 MG/5ML PO SOLN
650.0000 mg | Freq: Four times a day (QID) | ORAL | Status: DC | PRN
  Administered 2020-02-27: 650 mg via ORAL
  Filled 2020-02-26: qty 20.3

## 2020-02-26 MED ORDER — FOLIC ACID 1 MG PO TABS
1.0000 mg | ORAL_TABLET | Freq: Every day | ORAL | Status: DC
  Administered 2020-02-27: 1 mg via ORAL
  Filled 2020-02-26: qty 1

## 2020-02-26 MED ORDER — PANTOPRAZOLE SODIUM 40 MG PO TBEC
40.0000 mg | DELAYED_RELEASE_TABLET | Freq: Every day | ORAL | Status: DC
  Administered 2020-02-27: 40 mg via ORAL
  Filled 2020-02-26: qty 1

## 2020-02-26 MED ORDER — TIOTROPIUM BROMIDE MONOHYDRATE 18 MCG IN CAPS: 1.0000 | ORAL_CAPSULE | Freq: Every day | RESPIRATORY_TRACT | Status: DC

## 2020-02-26 MED ORDER — BENZTROPINE MESYLATE 0.5 MG PO TABS
0.5000 mg | ORAL_TABLET | Freq: Two times a day (BID) | ORAL | Status: DC
  Administered 2020-02-26 – 2020-02-27 (×2): 0.5 mg via ORAL
  Filled 2020-02-26 (×3): qty 1

## 2020-02-26 NOTE — Plan of Care (Signed)
  Problem: Nutrition: Goal: Adequate nutrition will be maintained Outcome: Progressing   

## 2020-02-26 NOTE — Progress Notes (Signed)
PMT provider chart review. Discussed with RN. Visited with patient at bedside. [name] [name] is awake, alert, oriented. She is sitting in the recliner and feeding herself soup without signs of aspiration. She is in good spirits and hopeful to discharge home soon and 'move on with the life I almost lost.' Daughter, [name] recently left for a doctors appointment. Reassured patient that I will f/u tomorrow when her daughter is at bedside. No questions or concerns.   NO CHARGE  Ihor Dow, [place], FNP-C Palliative Medicine Team  Phone: [phone] Fax: [phone]

## 2020-02-26 NOTE — Progress Notes (Signed)
PROGRESS NOTE    [name] [name]  IWP:[id number] DOB: 1955/07/16 DOA: 01/26/2020 PCP: Leonard Downing, MD   Brief Narrative: 64 year old with past medical history significant for heavy tobacco abuse, severe COPD/chronic respiratory failure on 4 L of oxygen at home, polysubstance abuse, type 2 diabetes, obesity, alcohol abuse, atrial fibrillation was admitted to [facility] on 9/5 with worsening pain swelling and drainage from right foot, second toe multiple ulcers right lower leg along with fevers and chills. -She was found to have sepsis and group A strep bacteremia from cellulitis and mild fasciitis of right lower extremity, underwent debridement of epidermis and dermis of second toe on 9/16 by Dr. Constance Haw.  [facility] course complicated by respiratory failure requiring mechanical ventilation on 9/7, she was extubated on 9/719 and then required reintubation due to stridor and respiratory distress, on 9/19 she was transferred to [facility] [place]. she was also followed by palliative care due to overall poor prognosis, eventually extubated on 9/23, has been on IV antibiotics for 24 days, finally discontinued on 9/29. -Transferred from PCCM to [facility] service 5/05. -Course complicated by severe dysphagia, coretrack tube in place.  She was able to pass a swallow evaluation and plan is to remove coretrack tube.   Assessment & Plan:   Active Problems:   Acute respiratory failure (HCC)   Acute respiratory failure with hypoxia and hypercapnia (HCC)   Sepsis (HCC)   Cellulitis of right lower leg   Atrial fibrillation with RVR (HCC)   Pleural effusion on right   Leukocytosis   Gangrene of toe of right foot (HCC)   Petechial rash   Toe infection   Nasogastric tube present   Palliative care by specialist   Goals of care, counseling/discussion   DNR (do not resuscitate)   Hypoxia   Necrotizing fasciitis of lower leg ([place])   Adult failure to thrive   Dyspnea   Dysphagia  1-Acute  respiratory failure with hypoxia and hypercapnia: -Secondary to pulmonary edema, sepsis, A. fib RVR, COPD exacerbation and mucous plugging -Required prolonged mechanical ventilation from 9/7, extubated and reintubated on 9/17 and finally extubated on 9/23, now DNR. -Patient was treated with diuretic and also underwent multiple therapeutic bronchoscopies. -Treated with multiple antibiotics course. -Use Lasix as needed -She also had episode of stridor secondary to you received IV Decadron -Plan to resume Spiriva today  2-Dysphagia, coretrack tube in place. She passed swallow eval, plan to remove coretrack tube today.   3-sepsis, POA, group B strep bacteremia: Secondary to right lower extremity ulcer, cellulitis Wound debridement on 9/16 Repeated blood cultures negative Received 24 days of antibiotics Plan to continue with local wound care and patient need to follow-up with Dr. Sharol Given in a few weeks post discharge  Transient A. fib with RVR systemic anticoagulation on hold  Severe COPD/chronic respiratory failure on 4 L of oxygen at baseline  Anxiety depression: Continue with Cogentin Depakote and Klonopin  diabetes: Continue with sliding scale insulin   Nutrition Problem: Increased nutrient needs Etiology: post-op healing    Signs/Symptoms: estimated needs    Interventions: Tube feeding  Estimated body mass index is 32.47 kg/m as calculated from the following:   Height as of this encounter: _0  (1.651 m).   Weight as of this encounter: 88.5 kg.   DVT prophylaxis: Lovenox Code Status: DNR Family Communication: Disposition Plan:  Status is: Inpatient  Remains inpatient appropriate because:Ongoing diagnostic testing needed not appropriate for outpatient work up   Dispo: The patient is from: Home  Anticipated d/c is to: Home              Anticipated d/c date is: 1 day              Patient currently is not medically stable to  d/c.        Consultants:   CCM    Subjective: Patient is alert, she is breathing well, she denies worsening cough.  She is eating a little bit more.  She ate 25% of her breakfast today  Objective: Vitals:   02/25/20 2024 02/26/20 0452 02/26/20 0903 02/26/20 1621  BP: 110/87 135/78 126/81 135/82  Pulse: 97 (!) 101 (!) 107 (!) 102  Resp: _0 Temp: 99 F (37.2 C) 98.9 F (37.2 C) 98.1 F (36.7 C) 98.3 F (36.8 C)  TempSrc: Oral Oral Oral   SpO2: 97% 98% 95% 99%  Weight:      Height:        Intake/Output Summary (Last 24 hours) at 02/26/2020 1708 Last data filed at 02/26/2020 1200 Gross per 24 hour  Intake 851 ml  Output 200 ml  Net 651 ml   Filed Weights   02/20/20 2119 02/22/20 0648 02/22/20 2017  Weight: 89.9 kg 90.2 kg 88.5 kg    Examination:  General exam: Appears calm and comfortable  Respiratory system: Bilateral rhonchorous, respiratory effort normal. Cardiovascular system: S1 & S2 heard, RRR. No JVD, murmurs, rubs, gallops or clicks. No pedal edema. Gastrointestinal system: Abdomen is nondistended, soft and nontender. No organomegaly or masses felt. Normal bowel sounds heard. Central nervous system: Alert and oriented.     Data Reviewed: I have personally reviewed following labs and imaging studies  CBC: Recent Labs  Lab 02/20/20 0513 02/20/20 0513 02/21/20 0907 02/21/20 0907 02/22/20 1035 02/23/20 0233 02/24/20 0731 02/25/20 0445 02/26/20 0940  WBC 6.0   < > 6.3   < > 7.3 7.7 9.2 10.4 10.0  NEUTROABS 2.8  --  3.2  --  3.9 4.2 5.0  --   --   HGB 14.4   < > 14.1   < > 15.5* 14.3 15.1* 14.4 14.8  HCT 49.1*   < > 47.8*   < > 51.7* 47.5* 49.7* 46.6* 48.1*  MCV 95.5   < > 92.6   < > 90.5 90.3 91.5 91.7 91.3  PLT 193   < > 199   < > 188 182 184 159 155   < > = values in this interval not displayed.   Basic Metabolic Panel: Recent Labs  Lab 02/20/20 0513 02/20/20 0513 02/21/20 0907 02/22/20 0738 02/23/20 0233 02/25/20 0445  02/26/20 0940  NA 145   < > 141 139 140 141 141  K 4.0   < > 4.2 4.8 3.9 4.0 4.1  CL 101   < > 95* 94* 94* 95* 93*  CO2 39*   < > 37* 31 36* 36* 38*  GLUCOSE 147*   < > 170* 131* 183* 175* 179*  BUN 22   < > _1 27* 17  CREATININE <0.30*   < > <0.30* <0.30* 0.36* <0.30* 0.31*  CALCIUM 9.1   < > 9.2 9.7 9.8 9.6 9.5  MG 1.9  --  1.6* 1.7  --   --   --    < > = values in this interval not displayed.   GFR: Estimated Creatinine Clearance: 79.1 mL/min (A) (by C-G formula based on SCr of 0.31 mg/dL (L)). Liver Function Tests:  No results for input(s): AST, ALT, ALKPHOS, BILITOT, PROT, ALBUMIN in the last 168 hours. No results for input(s): LIPASE, AMYLASE in the last 168 hours. No results for input(s): AMMONIA in the last 168 hours. Coagulation Profile: No results for input(s): INR, PROTIME in the last 168 hours. Cardiac Enzymes: No results for input(s): CKTOTAL, CKMB, CKMBINDEX, TROPONINI in the last 168 hours. BNP (last 3 results) No results for input(s): PROBNP in the last 8760 hours. HbA1C: No results for input(s): HGBA1C in the last 72 hours. CBG: Recent Labs  Lab 02/26/20 0014 02/26/20 0407 02/26/20 0811 02/26/20 1205 02/26/20 1621  GLUCAP 173* 141* 185* 181* 277*   Lipid Profile: No results for input(s): CHOL, HDL, LDLCALC, TRIG, CHOLHDL, LDLDIRECT in the last 72 hours. Thyroid Function Tests: No results for input(s): TSH, T4TOTAL, FREET4, T3FREE, THYROIDAB in the last 72 hours. Anemia Panel: No results for input(s): VITAMINB12, FOLATE, FERRITIN, TIBC, IRON, RETICCTPCT in the last 72 hours. Sepsis Labs: No results for input(s): PROCALCITON, LATICACIDVEN in the last 168 hours.  No results found for this or any previous visit (from the past 240 hour(s)).       Radiology Studies: No results found.      Scheduled Meds: . benztropine  0.5 mg Per Tube BID  . chlorhexidine gluconate (MEDLINE KIT)  15 mL Mouth Rinse BID  . Chlorhexidine Gluconate Cloth  6  each Topical Q0600  . clonazePAM  0.25 mg Oral Once  . clonazePAM  0.5 mg Per Tube BID  . cloNIDine  0.2 mg Per Tube BID  . enoxaparin (LOVENOX) injection  40 mg Subcutaneous Q24H  . folic acid  1 mg Per Tube Daily  . Gerhardt's butt cream   Topical BID  . insulin aspart  0-9 Units Subcutaneous Q4H  . mouth rinse  15 mL Mouth Rinse BID  . mupirocin ointment   Nasal Daily  . pantoprazole sodium  40 mg Per Tube Daily  . polyethylene glycol  17 g Per Tube Daily  . QUEtiapine  25 mg Per Tube QHS  . thiamine  100 mg Per Tube Daily  . umeclidinium bromide  1 puff Inhalation Daily  . valproic acid  500 mg Per Tube BID   Continuous Infusions: . sodium chloride Stopped (02/25/20 1900)     LOS: 31 days    Time spent: 35 minutes,     Linsie Lupo A Shameek Nyquist, MD [facility]   If 7PM-7AM, please contact night-coverage [email or web address]  02/26/2020, 5:08 PM

## 2020-02-26 NOTE — Progress Notes (Signed)
  Speech Language Pathology Treatment: Dysphagia  Patient Details Name: Kerina Simoneau MRN: [id number] DOB: 07-20-1955 Today's Date: 02/26/2020 Time: 1536-1600 SLP Time Calculation (min) (ACUTE ONLY): 24 min  Assessment / Plan / Recommendation Clinical Impression  Pt up in chair with daughter at bedside with improved endurance. Per MBS, able to upgrade with thin liquids at bedside vs repeating instrumental. Needed min verbal cues for small sips. She did have wet vocal quality after several trials and delayed throat clear. Masticated regular texture without delays or residual. Upgraded pt's texture to regular, thin liquids, no straws and pills via puree, clear throat intermittently. Will follow up while hospitalized for tolerance.    HPI HPI: 64yo female admitted 01/26/20 with right foot pain and swelling, as well as chills, myalgias. Multiple intubations 9/7-17, 9/17-23. PMH: COPD HTN, DM2, tobacco abuse, chronic substance abuse, BiPolar, colon cancer, anxiety/depression      SLP Plan  Continue with current plan of care       Recommendations  Diet recommendations: Regular;Thin liquid Liquids provided via: Cup;No straw Medication Administration: Whole meds with puree Supervision: Patient able to self feed Compensations: Slow rate;Small sips/bites;Clear throat intermittently Postural Changes and/or Swallow Maneuvers: Seated upright 90 degrees                Oral Care Recommendations: Oral care BID Follow up Recommendations: None SLP Visit Diagnosis: Dysphagia, unspecified (R13.10) Plan: Continue with current plan of care       GO                Houston Siren 02/26/2020, 4:12 PM

## 2020-02-27 ENCOUNTER — Telehealth: Payer: Self-pay

## 2020-02-27 DIAGNOSIS — A419 Sepsis, unspecified organism: Secondary | ICD-10-CM | POA: Diagnosis not present

## 2020-02-27 LAB — GLUCOSE, CAPILLARY
Glucose-Capillary: 115 mg/dL — ABNORMAL HIGH (ref 70–99)
Glucose-Capillary: 118 mg/dL — ABNORMAL HIGH (ref 70–99)
Glucose-Capillary: 159 mg/dL — ABNORMAL HIGH (ref 70–99)
Glucose-Capillary: 182 mg/dL — ABNORMAL HIGH (ref 70–99)

## 2020-02-27 MED ORDER — QUETIAPINE FUMARATE 25 MG PO TABS
25.0000 mg | ORAL_TABLET | Freq: Every day | ORAL | 1 refills | Status: DC
Start: 2020-02-27 — End: 2022-09-14

## 2020-02-27 MED ORDER — POLYETHYLENE GLYCOL 3350 17 G PO PACK
17.0000 g | PACK | Freq: Every day | ORAL | Status: DC
  Administered 2020-02-27: 17 g via ORAL
  Filled 2020-02-27: qty 1

## 2020-02-27 MED ORDER — VALPROIC ACID 250 MG PO CAPS: 500.0000 mg | ORAL_CAPSULE | Freq: Two times a day (BID) | ORAL | 0 refills | Status: DC

## 2020-02-27 MED ORDER — VALPROIC ACID 250 MG/5ML PO SOLN
500.0000 mg | Freq: Two times a day (BID) | ORAL | Status: DC
  Administered 2020-02-27: 500 mg via ORAL
  Filled 2020-02-27 (×2): qty 10

## 2020-02-27 MED ORDER — PANTOPRAZOLE SODIUM 40 MG PO TBEC
40.0000 mg | DELAYED_RELEASE_TABLET | Freq: Every day | ORAL | 0 refills | Status: DC
Start: 2020-02-27 — End: 2022-06-04

## 2020-02-27 MED ORDER — THIAMINE HCL 100 MG PO TABS
100.0000 mg | ORAL_TABLET | Freq: Every day | ORAL | 0 refills | Status: DC
Start: 2020-02-27 — End: 2022-06-04

## 2020-02-27 MED ORDER — GUAIFENESIN 100 MG/5ML PO SOLN: 10.0000 mL | ORAL | Status: DC | PRN

## 2020-02-27 MED ORDER — QUETIAPINE FUMARATE 25 MG PO TABS: 25.0000 mg | ORAL_TABLET | Freq: Every day | ORAL | Status: DC

## 2020-02-27 MED ORDER — THIAMINE HCL 100 MG PO TABS
100.0000 mg | ORAL_TABLET | Freq: Every day | ORAL | Status: DC
  Administered 2020-02-27: 100 mg via ORAL
  Filled 2020-02-27: qty 1

## 2020-02-27 MED ORDER — FOLIC ACID 1 MG PO TABS
1.0000 mg | ORAL_TABLET | Freq: Every day | ORAL | 0 refills | Status: DC
Start: 2020-02-27 — End: 2022-06-04

## 2020-02-27 MED ORDER — CLONIDINE HCL 0.2 MG PO TABS: 0.2000 mg | ORAL_TABLET | Freq: Two times a day (BID) | ORAL | 0 refills | Status: DC

## 2020-02-27 MED ORDER — CLONAZEPAM 0.5 MG PO TABS: 0.5000 mg | ORAL_TABLET | Freq: Two times a day (BID) | ORAL | 0 refills | Status: DC

## 2020-02-27 MED ORDER — BENZTROPINE MESYLATE 0.5 MG PO TABS: 0.5000 mg | ORAL_TABLET | Freq: Two times a day (BID) | ORAL | 0 refills | Status: DC

## 2020-02-27 MED ORDER — POLYETHYLENE GLYCOL 3350 17 G PO PACK: 17.0000 g | PACK | Freq: Every day | ORAL | 0 refills | Status: DC

## 2020-02-27 MED ORDER — GUAIFENESIN 100 MG/5ML PO SOLN: 10.0000 mL | ORAL | 0 refills | Status: DC | PRN

## 2020-02-27 NOTE — Discharge Summary (Signed)
Physician Discharge Summary  [name] [name] XKG:[id number] DOB: [birth year] DOA: 01/26/2020  PCP: [name] Downing, MD  Admit date: 01/26/2020 Discharge date: 02/27/2020  Admitted From: Home  Disposition:  Home   Recommendations for Outpatient Follow-up:  1. Follow up with PCP in 1-2 weeks 2. Please obtain BMP/CBC in one week 3. Needs to follow up with Dr [name] [name], for further care lower extremities.  4. Palliative care follow up at home  Home Health: yes.  Equipment/Devices: DME, wheelchair, 3:1 walker.   Discharge Condition: Stable.  CODE STATUS: DNR Diet recommendation: Heart Healthy   Brief/Interim Summary: 64 year old with past medical history significant for heavy tobacco abuse, severe COPD/chronic respiratory failure on 4 L of oxygen at home, polysubstance abuse, type 2 diabetes, obesity, alcohol abuse, atrial fibrillation was admitted to [facility] on 9/5 with worsening pain swelling and drainage from right foot, second toe multiple ulcers right lower leg along with fevers and chills. -She was found to have sepsis and group A strep bacteremia from cellulitis and mild fasciitis of right lower extremity, underwent debridement of epidermis and dermis of second toe on 9/16 by Dr. Constance [name].  [name] course complicated by respiratory failure requiring mechanical ventilation on 9/7, she was extubated on 9/719 and then required reintubation due to stridor and respiratory distress, on 9/19 she was transferred to [facility]. she was also followed by palliative care due to overall poor prognosis, eventually extubated on 9/23, has been on IV antibiotics for 24 days, finally discontinued on 9/29. -Transferred from PCCM to [facility] service 7/02. -Course complicated by severe dysphagia, coretrack tube in place.  She was able to pass a swallow evaluation and coretrack tube was removed. Patient has been able to tolerates diet. Stable for discharge.    1-Acute respiratory failure with  hypoxia and hypercapnia: -Secondary to pulmonary edema, sepsis, A. fib RVR, COPD exacerbation and mucous plugging -Required prolonged mechanical ventilation from 9/7, extubated and reintubated on 9/17 and finally extubated on 9/23, now DNR. -Patient was treated with diuretic and also underwent multiple therapeutic bronchoscopies. -Treated with multiple antibiotics course. -Use Lasix as needed -She also had episode of stridor after extubation, patient  received IV Decadron -Continue with spiriva.  -Stable for discharge.   2-Dysphagia, coretrack tube was removed.  She passed swallow eval, she has been tolerating diet   3-Sepsis, POA, group B strep bacteremia: Secondary to right lower extremity ulcer, cellulitis Wound debridement on 9/16 Repeated blood cultures negative Received 24 days of antibiotics Plan to continue with local wound care and patient need to follow-up with Dr. Sharol [name] in a few weeks post discharge  Transient A. fib with RVR systemic anticoagulation on hold  Severe COPD/chronic respiratory failure on 4 L of oxygen at baseline  Anxiety depression: Continue with Cogentin Depakote and Klonopin. Resume zoloft. Will provide 5 days of klonopin.   Diabetes: Continue with sliding scale insulin    Discharge Diagnoses:  Active Problems:   Acute respiratory failure (HCC)   Acute respiratory failure with hypoxia and hypercapnia (HCC)   Sepsis (HCC)   Cellulitis of right lower leg   Atrial fibrillation with RVR (HCC)   Pleural effusion on right   Leukocytosis   Gangrene of toe of right foot (HCC)   Petechial rash   Toe infection   Nasogastric tube present   Palliative care by specialist   Goals of care, counseling/discussion   DNR (do not resuscitate)   Hypoxia   Necrotizing fasciitis of lower leg ([place])   Adult failure  to thrive   Dyspnea   Dysphagia    Discharge Instructions  Discharge Instructions    Change dressing   Complete by: As directed    Daily  dry dressing change to Right leg. May cleanse with antibacterial soap and water   Diet - low sodium heart healthy   Complete by: As directed    Discharge wound care:   Complete by: As directed    See above   Increase activity slowly   Complete by: As directed      Allergies as of 02/27/2020      Reactions   Bactrim [sulfamethoxazole-trimethoprim] Anaphylaxis   Amoxicillin Hives   Diclofenac Tinitus   swelling in feet      Medication List    STOP taking these medications   amitriptyline 100 MG tablet Commonly known as: ELAVIL   amLODipine 5 MG tablet Commonly known as: NORVASC   busPIRone 10 MG tablet Commonly known as: BUSPAR   divalproex 500 MG DR tablet Commonly known as: DEPAKOTE   famotidine 20 MG tablet Commonly known as: Pepcid   metFORMIN 850 MG tablet Commonly known as: GLUCOPHAGE     TAKE these medications   albuterol (5 MG/ML) 0.5% nebulizer solution Commonly known as: PROVENTIL Take 2.5 mg by nebulization every 6 (six) hours as needed for wheezing or shortness of breath.   benztropine 0.5 MG tablet Commonly known as: COGENTIN Take 1 tablet (0.5 mg total) by mouth 2 (two) times daily.   clonazePAM 0.5 MG tablet Commonly known as: KLONOPIN Take 1 tablet (0.5 mg total) by mouth 2 (two) times daily for 5 days.   cloNIDine 0.2 MG tablet Commonly known as: CATAPRES Take 1 tablet (0.2 mg total) by mouth 2 (two) times daily.   folic acid 1 MG tablet Commonly known as: FOLVITE Take 1 tablet (1 mg total) by mouth daily.   guaiFENesin 100 MG/5ML Soln Commonly known as: ROBITUSSIN Take 10 mLs (200 mg total) by mouth every 4 (four) hours as needed for cough or to loosen phlegm.   pantoprazole 40 MG tablet Commonly known as: PROTONIX Take 1 tablet (40 mg total) by mouth daily.   polyethylene glycol 17 g packet Commonly known as: MIRALAX / GLYCOLAX Take 17 g by mouth daily.   QUEtiapine 25 MG tablet Commonly known as: SEROQUEL Take 1 tablet (25 mg  total) by mouth at bedtime. What changed:   medication strength  how much to take  when to take this   sertraline 100 MG tablet Commonly known as: ZOLOFT Take 200 mg by mouth daily.   Spiriva HandiHaler 18 MCG inhalation capsule Generic drug: tiotropium Place 1 capsule into inhaler and inhale daily.   thiamine 100 MG tablet Take 1 tablet (100 mg total) by mouth daily.   valproic acid 250 MG capsule Commonly known as: Depakene Take 2 capsules (500 mg total) by mouth 2 (two) times daily.            Durable Medical Equipment  (From admission, onward)         Start     Ordered   02/27/20 1031  For home use only DME Walker rolling  Once       Question Answer Comment  Walker: With 5 Inch Wheels   Patient needs a walker to treat with the following condition Gait abnormality      02/27/20 1030   02/27/20 1031  For home use only DME 3 n 1  Once  02/27/20 1030   02/27/20 1031  For home use only DME lightweight manual wheelchair with seat cushion  Once       Comments: Patient suffers from debility  which impairs their ability to perform daily activities like walk long distance to go to appointment.  in the home.  A walkerwill not resolve  issue with performing activities of daily living. A wheelchair will allow patient to safely perform daily activities. Patient is not able to propel themselves in the home using a standard weight wheelchair due to weakness Patient can self propel in the lightweight wheelchair. Length of need 3 month Accessories: elevating leg rests  wheel locks, extensions and anti-tippers.   02/27/20 1032           Discharge Care Instructions  (From admission, onward)         Start     Ordered   02/27/20 0000  Discharge wound care:       Comments: See above   02/27/20 1256   02/27/20 0000  Change dressing       Comments: Daily dry dressing change to Right leg. May cleanse with antibacterial soap and water   02/27/20 1117           Follow-up Information    Newt Minion, MD Follow up in 1 week(s).   Specialty: Orthopedic Surgery Contact information: [address] [place] [address] [phone]              Allergies  Allergen Reactions  . Bactrim [Sulfamethoxazole-Trimethoprim] Anaphylaxis  . Amoxicillin Hives  . Diclofenac Tinitus    swelling in feet    Consultations:  CCM   Procedures/Studies: DG Chest 1 View  Result Date: 02/13/2020 CLINICAL DATA:  02/10/2020 EXAM: CHEST  1 VIEW COMPARISON:  Endotracheal tube present, respiratory failure, code sepsis. FINDINGS: Endotracheal tube tip at the level of the clavicular heads. Interval removal of the gastric tube. Similar mild enlargement the cardiac silhouette. Improved interstitial prominence. Similar moderate right pleural effusion with suspected right lower lobe collapse. No discernible pneumothorax. IMPRESSION: 1. Endotracheal tube tip at the level of the clavicular heads. 2. Similar cardiomegaly with resolution of previously seen interstitial edema. 3. Similar findings of a moderate right pleural effusion and suspected right lower lobe collapse. Electronically Signed   By: Margaretha Sheffield MD   On: 02/13/2020 08:13   DG Abd 1 View  Result Date: 02/03/2020 CLINICAL DATA:  Intractable nausea and vomiting. EXAM: ABDOMEN - 1 VIEW COMPARISON:  None. FINDINGS: The NG tube tip is in the fundal region of the stomach. Scattered air throughout the small bowel and colon without distension may suggest a mild ileus or gastroenteritis. No findings for obstruction or perforation. IMPRESSION: 1. NG tube in good position. 2. Possible mild ileus or gastroenteritis. Electronically Signed   By: Marijo Sanes M.D.   On: 02/03/2020 11:43   CT TIBIA FIBULA RIGHT W CONTRAST  Result Date: 02/01/2020 CLINICAL DATA:  Right lower extremity pain, swelling and redness. EXAM: CT OF THE LOWER RIGHT EXTREMITY WITH CONTRAST TECHNIQUE: Multidetector CT imaging of the lower right  extremity was performed according to the standard protocol following intravenous contrast administration. COMPARISON:  Radiographs 01/26/2020 CONTRAST:  64mL OMNIPAQUE IOHEXOL 300 MG/ML  SOLN FINDINGS: The knee and ankle joints are maintained. There are moderate degenerative changes most notably involving the medial compartment of the knee joint. No fracture or osteochondral lesion is identified. The tibia and fibula are intact. No fracture or destructive bony changes. Small  knee joint effusion is noted. No erosive findings. No findings suspicious for septic arthritis. Diffuse subcutaneous soft tissue swelling/edema/fluid and skin thickening consistent with cellulitis. I do not see a discrete drainable fluid collection to suggest an abscess. No gas is seen in the soft tissues. There is also moderate myofasciitis involving the mid to lower calf musculature with fluid in the fascial planes. I do not see any obvious changes of pyomyositis. Suggesting IMPRESSION: 1. Diffuse subcutaneous soft tissue swelling/edema/fluid and skin thickening consistent with cellulitis. No discrete drainable fluid collection to suggest an abscess. 2. Moderate myofasciitis involving the mid to lower calf musculature with fluid in the fascial planes. No obvious changes of pyomyositis. 3. No CT findings suspicious for septic arthritis or osteomyelitis. 4. Small knee joint effusion. Electronically Signed   By: Marijo Sanes M.D.   On: 02/01/2020 10:56   DG CHEST PORT 1 VIEW  Result Date: 02/10/2020 CLINICAL DATA:  Follow-up pneumothorax and pleural effusion. Acute respiratory failure. Endotracheally intubated. EXAM: PORTABLE CHEST 1 VIEW COMPARISON:  Prior today FINDINGS: Endotracheal tube and nasogastric tube remain in appropriate position. Cardiomegaly remains stable. Mild diffuse interstitial infiltrates are unchanged and consistent with interstitial edema. Small right pleural effusion is stable. No pneumothorax visualized. Infiltrate or  atelectasis in the right lung base is unchanged. IMPRESSION: Stable cardiomegaly and diffuse interstitial edema. Stable small right pleural effusion and right basilar atelectasis versus infiltrate. No pneumothorax visualized. Electronically Signed   By: Marlaine Hind M.D.   On: 02/10/2020 11:01   DG CHEST PORT 1 VIEW  Result Date: 02/10/2020 CLINICAL DATA:  Hypoxia, COPD EXAM: PORTABLE CHEST 1 VIEW COMPARISON:  02/07/2020 FINDINGS: Endotracheal tube is seen 5.4 cm above the carina. Nasogastric tube extends into the upper abdomen beyond the margin of the examination. Moderate right pleural effusion has increased in size since prior examination with associated right lower lobe and partial right middle lobe collapse. Left lung is clear. No pneumothorax or pleural effusion. Cardiac size is within normal limits. Mediastinal shift to the right secondary to right-sided volume loss again noted. Pulmonary vascularity is normal when accounting for changes in radiographic technique. Multiple healed left rib fractures are noted. IMPRESSION: Stable support tubes. Enlarging right pleural effusion. Right basilar collapse with persistent mild mediastinal shift to the right. Electronically Signed   By: Fidela Salisbury MD   On: 02/10/2020 06:04   DG Chest Port 1 View  Result Date: 02/07/2020 CLINICAL DATA:  Endotracheal tube and NG tube placement EXAM: PORTABLE CHEST 1 VIEW COMPARISON:  02/06/2020 FINDINGS: Endotracheal tube in good position. NG tube enters the stomach with the tip not visualized. Right pleural effusion and right lower lobe collapse unchanged. Mild left lower lobe atelectasis and pleural effusion has progressed. No pulmonary edema. IMPRESSION: Endotracheal tube in good position. Right lower lobe collapse and right pleural effusion unchanged. Progressive left lower lobe atelectasis and effusion. Electronically Signed   By: Franchot Gallo M.D.   On: 02/07/2020 15:52   DG Chest Port 1 View  Result Date:  02/06/2020 CLINICAL DATA:  Respiratory distress. EXAM: PORTABLE CHEST 1 VIEW COMPARISON:  02/05/2020 FINDINGS: The endotracheal tube and NG tubes are in good position, unchanged. The cardiac silhouette, mediastinal and hilar contours are stable. Persistent right lower lobe airspace consolidation/pneumonia and parapneumonic effusion. The left lung remains relatively clear. IMPRESSION: 1. Stable support apparatus. 2. Persistent right lower lobe airspace consolidation and parapneumonic effusion. Electronically Signed   By: Marijo Sanes M.D.   On: 02/06/2020 07:36   DG  CHEST PORT 1 VIEW  Result Date: 02/05/2020 CLINICAL DATA:  ICU patient with negative COVID test, currently intubated EXAM: PORTABLE CHEST 1 VIEW COMPARISON:  02/05/2020 FINDINGS: Endotracheal tube projects between clavicular heads approximately 5.4 cm above the carina. Gastric tube terminating in the upper abdomen the area of the proximal stomach. Cardiomediastinal contours are enlarged accentuated by portable technique and rotation to the RIGHT. The engorgement of central pulmonary vascular structures, a RIGHT heart border obscured by RIGHT lower lobe airspace disease and effusion. Background interstitial markings increased. No lobar consolidation or sign of effusion on the LEFT. Evidence of prior trauma to the LEFT lateral ribs as before.  The IMPRESSION: 1. Findings of RIGHT-sided effusion and basilar collapse/consolidation with worsening over a series of prior studies is similar to the most recent exam. 2. Endotracheal tube in place, tip between clavicular heads. 3. Background interstitial prominence may represent pneumonitis or edema similar to the prior study. Electronically Signed   By: Zetta Bills M.D.   On: 02/05/2020 09:53   DG Chest Port 1 View  Result Date: 02/05/2020 CLINICAL DATA:  Check endotracheal tube placement EXAM: PORTABLE CHEST 1 VIEW COMPARISON:  02/04/2020 FINDINGS: Cardiac shadow is stable. Endotracheal tube and  gastric catheter are seen and stable. Increasing right-sided pleural effusion and likely underlying infiltrate is noted. Left lung remains clear. No bony abnormality is noted. IMPRESSION: Increasing right-sided pleural effusion and likely underlying infiltrate. Electronically Signed   By: Inez Catalina M.D.   On: 02/05/2020 06:50   DG Chest Port 1 View  Result Date: 02/04/2020 CLINICAL DATA:  Intubation EXAM: PORTABLE CHEST 1 VIEW COMPARISON:  Two days ago FINDINGS: Endotracheal tube with tip at the clavicular heads. The enteric tube reaches the stomach. Worsening aeration at the right base. Cardiomegaly. Remote left rib fracture. IMPRESSION: Worsening aeration at the right base which could be pneumonia or atelectasis. Electronically Signed   By: Monte Fantasia M.D.   On: 02/04/2020 07:27   DG CHEST PORT 1 VIEW  Result Date: 02/02/2020 CLINICAL DATA:  Intubated EXAM: PORTABLE CHEST 1 VIEW COMPARISON:  01/31/2020 FINDINGS: Endotracheal tube and NG tube unchanged. Stable cardiac silhouette. Coarse interstitial lung markings are unchanged. No pneumothorax. No focal consolidation. IMPRESSION: 1. Stable support apparatus. 2. Coarse interstitial lung markings. Electronically Signed   By: Suzy Bouchard M.D.   On: 02/02/2020 08:50   DG Chest Port 1 View  Result Date: 01/31/2020 CLINICAL DATA:  Respiratory failure. EXAM: PORTABLE CHEST 1 VIEW COMPARISON:  01/29/2020. FINDINGS: Endotracheal tube and NG tube in stable position. Cardiomegaly with pulmonary venous congestion. Bilateral interstitial prominence again noted. This could be secondary to CHF and or pneumonitis. Tiny left pleural effusion cannot be excluded. No pneumothorax. IMPRESSION: 1. Lines and tubes in stable position. 2. Cardiomegaly with pulmonary venous congestion. Bilateral interstitial prominence again noted. This could be secondary to interstitial edema and or pneumonitis. Tiny left pleural effusion cannot be excluded. Electronically Signed    By: Marcello Moores  Register   On: 01/31/2020 05:15   DG CHEST PORT 1 VIEW  Result Date: 01/29/2020 CLINICAL DATA:  Nasogastric tube placement EXAM: PORTABLE CHEST 1 VIEW COMPARISON:  Yesterday FINDINGS: Nasogastric tube tip reaches the stomach at least. The side port is not visualized on this study. The endotracheal tube tip is at the clavicular heads. Patchy infiltrate at the left base. Interstitial coarsening on both sides. Borderline heart size, stable. No visible effusion or pneumothorax. IMPRESSION: 1. Unremarkable hardware positioning. 2. Left base infiltrate. Electronically Signed   By:  Monte Fantasia M.D.   On: 01/29/2020 11:41   DG CHEST PORT 1 VIEW  Result Date: 01/28/2020 CLINICAL DATA:  Hypoxia EXAM: PORTABLE CHEST 1 VIEW COMPARISON:  January 27, 2020 and January 10, 2020; May 27, 2018 FINDINGS: Endotracheal tube tip is 7.7 cm above the carina. Nasogastric tube tip is in the proximal stomach with the side port at the gastroesophageal junction. No pneumothorax. There is interstitial thickening throughout the lungs bilaterally, likely due to a degree of underlying fibrosis. There is no frank edema or airspace opacity. Heart is borderline enlarged with pulmonary vascularity normal. No adenopathy. No bone lesions. IMPRESSION: Tube positions as described without pneumothorax. It may be prudent to consider advancing nasogastric tube 4-5 cm to insure that both the tip and side port are well within the stomach. Underlying apparent interstitial fibrosis. No frank edema or airspace opacity. A degree of interstitial edema superimposed on fibrosis is difficult to exclude in this circumstance. No consolidation. Stable cardiac prominence.  No adenopathy demonstrable. Electronically Signed   By: Lowella Grip III M.D.   On: 01/28/2020 13:22   DG Abd Portable 1V  Result Date: 02/07/2020 CLINICAL DATA:  NG tube placement. EXAM: PORTABLE ABDOMEN - 1 VIEW COMPARISON:  02/07/2020 FINDINGS: NG has been advanced.  Tip is now in the gastric antrum. No dilated bowel loops identified. Bibasilar airspace disease right greater than left. IMPRESSION: NG has been advanced with the tip now in the gastric antrum. No dilated bowel loops. Electronically Signed   By: Franchot Gallo M.D.   On: 02/07/2020 15:53   DG Abd Portable 1V  Result Date: 02/07/2020 CLINICAL DATA:  OG tube placement. EXAM: PORTABLE ABDOMEN - 1 VIEW COMPARISON:  02/03/2020 FINDINGS: The tip of an enteric tube projects over the gastric body with side hole also below the diaphragm. Mild gaseous distension of small bowel loops in the left hemiabdomen has decreased from the prior study with the lower abdomen and pelvis not included on today's examination. Consolidation in the right lung base and a right pleural effusion were more fully evaluated on yesterday's chest radiographs. IMPRESSION: Enteric tube in the stomach. Electronically Signed   By: Logan Bores M.D.   On: 02/07/2020 10:15   DG Swallowing Func-Speech Pathology  Result Date: 02/24/2020 Objective Swallowing Evaluation: Type of Study: MBS-Modified Barium Swallow Study  Patient Details Name: Bobbi Yount MRN: [id number] Date of Birth: [birth year] Today's Date: 02/24/2020 Time: SLP Start Time (ACUTE ONLY): 1302 -SLP Stop Time (ACUTE ONLY): 1320 SLP Time Calculation (min) (ACUTE ONLY): 18 min Past Medical History: Past Medical History: Diagnosis Date . Anxiety  . Bipolar 1 disorder ([place])  . Cancer ([place])   colon . Chronic diarrhea   Followed by Dr Michail Sermon  . Complication of anesthesia   Hx resp distress during surgery . COPD (chronic obstructive pulmonary disease) ([place])  . Depression  . Diabetes mellitus ([place])  . External hemorrhoids  . GI bleed  . Headache(784.0)  . Hyperlipidemia  . Hypertension  . Hypothyroidism  . Insomnia  . Internal hemorrhoids  . Polysubstance abuse ([place]) Quit 2008   H/O methamphetamine and alcohol abuse, clean x 6 years . Shortness of breath  . Suicidal thoughts  . Tobacco abuse   . Tubular adenoma of colon  Past Surgical History: Past Surgical History: Procedure Laterality Date . ABDOMINAL HYSTERECTOMY   . APPENDECTOMY   . COLON SURGERY    "colon repair" . COLONOSCOPY WITH PROPOFOL N/A 12/11/2012  Procedure: COLONOSCOPY WITH PROPOFOL;  Surgeon: Lajuan Lines  Pyrtle, MD;  Location: [facility] ENDOSCOPY;  Service: Gastroenterology;  Laterality: N/A; . FINGER SURGERY    middle left finger . HERNIA REPAIR    x 2 . KNEE SURGERY Left   Left knee tendon surgery . TONSILLECTOMY   . TUBAL LIGATION   . WOUND DEBRIDEMENT Right 02/06/2020  Procedure: SKIN BIOPSY WITH DEBRIDEMENT RIGHT LOWER LEG;  Surgeon: Virl Cagey, MD;  Location: [facility];  Service: General;  Laterality: Right; HPI: 64yo female admitted 01/26/20 with right foot pain and swelling, as well as chills, myalgias. Multiple intubations 9/7-17, 9/17-23. PMH: COPD HTN, DM2, tobacco abuse, chronic substance abuse, BiPolar, colon cancer, anxiety/depression  Subjective: Pt resting in bed. No family present Assessment / Plan / Recommendation CHL IP CLINICAL IMPRESSIONS 02/24/2020 Clinical Impression Swallow function has improved from FEES on 10/1 and recommend pt initiate puree (Dys 1) texture, nectar thick liquids. Her lingual propulsion is delayed with pumping and decreased cohesion. Increased duration of mastication and transit with soft cereal bar texture. Barium hesitated longer than normal in her pyriform sinuses with thin and nectar. There was questionable slight penetration over interarytenoid space on 2 occasions after large sip and reflexive immediate cough that was difficult to fully view. [name] pt's cognitive status and impulsivty, recommend she initiate nectar thick (upgrade at bedside), Dys 1 (puree) with potential upgrade when daughter asked to bring dentures. Straws allowed, crushed meds and full assist.   SLP Visit Diagnosis Dysphagia, oropharyngeal phase (R13.12) Attention and concentration deficit following -- Frontal lobe and executive  function deficit following -- Impact on safety and function Mild aspiration risk   CHL IP TREATMENT RECOMMENDATION 02/24/2020 Treatment Recommendations Therapy as outlined in treatment plan below   Prognosis 02/24/2020 Prognosis for Safe Diet Advancement Good Barriers to Reach Goals -- Barriers/Prognosis Comment -- CHL IP DIET RECOMMENDATION 02/24/2020 SLP Diet Recommendations Nectar thick liquid;Dysphagia 1 (Puree) solids Liquid Administration via Cup;Straw Medication Administration Crushed with puree Compensations Minimize environmental distractions;Slow rate;Small sips/bites Postural Changes Seated upright at 90 degrees   CHL IP OTHER RECOMMENDATIONS 02/24/2020 Recommended Consults -- Oral Care Recommendations Oral care BID Other Recommendations --   CHL IP FOLLOW UP RECOMMENDATIONS 02/24/2020 Follow up Recommendations Skilled Nursing facility   [facility] IP FREQUENCY AND DURATION 02/24/2020 Speech Therapy Frequency (ACUTE ONLY) min 2x/week Treatment Duration 2 weeks      CHL IP ORAL PHASE 02/24/2020 Oral Phase Impaired Oral - Pudding Teaspoon -- Oral - Pudding Cup -- Oral - Honey Teaspoon NT Oral - Honey Cup Decreased bolus cohesion;Delayed oral transit;Lingual pumping Oral - Nectar Teaspoon NT Oral - Nectar Cup Lingual pumping;Delayed oral transit;Decreased bolus cohesion Oral - Nectar Straw Lingual pumping;Delayed oral transit Oral - Thin Teaspoon NT Oral - Thin Cup Lingual pumping;Decreased bolus cohesion;Delayed oral transit Oral - Thin Straw -- Oral - Puree Delayed oral transit Oral - Mech Soft Weak lingual manipulation;Delayed oral transit;Reduced posterior propulsion Oral - Regular -- Oral - Multi-Consistency -- Oral - Pill -- Oral Phase - Comment --  CHL IP PHARYNGEAL PHASE 02/24/2020 Pharyngeal Phase Impaired Pharyngeal- Pudding Teaspoon -- Pharyngeal -- Pharyngeal- Pudding Cup -- Pharyngeal -- Pharyngeal- Honey Teaspoon NT Pharyngeal -- Pharyngeal- Honey Cup Delayed swallow initiation-vallecula Pharyngeal --  Pharyngeal- Nectar Teaspoon NT Pharyngeal -- Pharyngeal- Nectar Cup Delayed swallow initiation-pyriform sinuses Pharyngeal Material does not enter airway Pharyngeal- Nectar Straw Delayed swallow initiation-pyriform sinuses Pharyngeal Material does not enter airway Pharyngeal- Thin Teaspoon NT Pharyngeal -- Pharyngeal- Thin Cup Delayed swallow initiation-pyriform sinuses;Penetration/Aspiration during swallow Pharyngeal Material enters airway, remains ABOVE  vocal cords then ejected out;Material enters airway, remains ABOVE vocal cords and not ejected out Pharyngeal- Thin Straw -- Pharyngeal -- Pharyngeal- Puree Delayed swallow initiation-vallecula Pharyngeal -- Pharyngeal- Mechanical Soft WFL Pharyngeal -- Pharyngeal- Regular -- Pharyngeal -- Pharyngeal- Multi-consistency -- Pharyngeal -- Pharyngeal- Pill -- Pharyngeal -- Pharyngeal Comment --  CHL IP CERVICAL ESOPHAGEAL PHASE 02/24/2020 Cervical Esophageal Phase Impaired Pudding Teaspoon -- Pudding Cup -- Honey Teaspoon -- Honey Cup -- Nectar Teaspoon -- Nectar Cup -- Nectar Straw -- Thin Teaspoon -- Thin Cup -- Thin Straw -- Puree -- Mechanical Soft -- Regular -- Multi-consistency -- Pill -- Cervical Esophageal Comment -- [place] Siren 02/24/2020, 2:54 PM Orbie Pyo Litaker M.Ed Actor Pager [phone] Office [phone]              ECHOCARDIOGRAM COMPLETE  Result Date: 01/29/2020    ECHOCARDIOGRAM REPORT   Patient Name:   KAREN HUHTA Date of Exam: 01/29/2020 Medical Rec #:  [id number]         Height:       65.0 in Accession #:    [id number]        Weight:       217.8 lb Date of Birth:  Mar 25, 1956         BSA:          2.051 m Patient Age:    50 years          BP:           126/66 mmHg Patient Gender: F                 HR:           88 bpm. Exam Location:  [facility] Procedure: 2D Echo Indications:    Cardiomegaly 429.3 / I51.7,  History:        Patient has no prior history of Echocardiogram examinations.                 COPD;  Risk Factors:Current Smoker, Dyslipidemia, Hypertension                 and Diabetes. Severe Sepsis, ETOH, Bipolar, sepsis/ gm pos                 bacteremia.  Sonographer:    Leavy Cella RDCS (AE) Referring Phys: [place]  1. Left ventricular ejection fraction, by estimation, is 55 to 60%. The left ventricle has normal function. The left ventricle has no regional wall motion abnormalities. There is mild left ventricular hypertrophy. Left ventricular diastolic parameters are indeterminate.  2. Right ventricular systolic function is normal. The right ventricular size is normal. Tricuspid regurgitation signal is inadequate for assessing PA pressure.  3. Left atrial size was mildly dilated.  4. The mitral valve is abnormal, mildly calcified. Trivial mitral valve regurgitation. Moderate mitral annular calcification.  5. Tricuspid valve is mildly calcified with trivial tricuspid regurgitation.  6. The aortic valve is tricuspid. Aortic valve regurgitation is not visualized. Mild to moderate aortic valve sclerosis/calcification is present, without any evidence of aortic stenosis.  7. The inferior vena cava is normal in size with <50% respiratory variability, suggesting right atrial pressure of 8 mmHg.  8. No definite valvular vegetations. FINDINGS  Left Ventricle: Left ventricular ejection fraction, by estimation, is 55 to 60%. The left ventricle has normal function. The left ventricle has no regional wall motion abnormalities. The left ventricular internal cavity size was normal in size. There is  mild left  ventricular hypertrophy. Left ventricular diastolic parameters are indeterminate. Right Ventricle: The right ventricular size is normal. No increase in right ventricular wall thickness. Right ventricular systolic function is normal. Tricuspid regurgitation signal is inadequate for assessing PA pressure. Left Atrium: Left atrial size was mildly dilated. Right Atrium: Right atrial size was  normal in size. Pericardium: There is no evidence of pericardial effusion. Mitral Valve: The mitral valve is abnormal. There is mild thickening of the mitral valve leaflet(s). Moderate mitral annular calcification. Trivial mitral valve regurgitation. Tricuspid Valve: Tricuspid valve is mildly calcified with trivial tricuspid regurgitation. The tricuspid valve is grossly normal. Tricuspid valve regurgitation is trivial. Aortic Valve: The aortic valve is tricuspid. There is mild to moderate aortic valve annular calcification. Aortic valve regurgitation is not visualized. Mild to moderate aortic valve sclerosis/calcification is present, without any evidence of aortic stenosis. Pulmonic Valve: The pulmonic valve was grossly normal. Pulmonic valve regurgitation is trivial. Aorta: The aortic root is normal in size and structure. Venous: The inferior vena cava is normal in size with less than 50% respiratory variability, suggesting right atrial pressure of 8 mmHg. IAS/Shunts: No atrial level shunt detected by color flow Doppler.  LEFT VENTRICLE PLAX 2D LVIDd:         3.71 cm  Diastology LVIDs:         2.08 cm  LV e' medial:    4.87 cm/s LV PW:         1.28 cm  LV E/e' medial:  22.2 LV IVS:        1.23 cm  LV e' lateral:   4.95 cm/s LVOT diam:     2.00 cm  LV E/e' lateral: 21.8 LVOT Area:     3.14 cm  RIGHT VENTRICLE RV S prime:     11.70 cm/s TAPSE (M-mode): 2.7 cm LEFT ATRIUM             Index       RIGHT ATRIUM           Index LA diam:        3.70 cm 1.80 cm/m  RA Area:     20.40 cm LA Vol (A2C):   72.8 ml 35.49 ml/m RA Volume:   66.90 ml  32.62 ml/m LA Vol (A4C):   76.6 ml 37.34 ml/m LA Biplane Vol: 76.9 ml 37.49 ml/m   AORTA Ao Root diam: 2.80 cm MITRAL VALVE MV Area (PHT): 3.99 cm     SHUNTS MV Decel Time: 190 msec     Systemic Diam: 2.00 cm MR Peak grad: 83.2 mmHg MR Vmax:      456.00 cm/s MV E velocity: 108.00 cm/s MV A velocity: 124.00 cm/s MV E/A ratio:  0.87 Rozann Lesches MD Electronically signed by  Rozann Lesches MD Signature Date/Time: 01/29/2020/10:08:48 AM    Final    CT EXTREMITY LOWER RIGHT W CONTRAST  Result Date: 02/05/2020 CLINICAL DATA:  Cellulitis of the lower extremity fever and body aches EXAM: CT OF THE LOWER RIGHT EXTREMITY WITH CONTRAST TECHNIQUE: Multidetector CT imaging of the lower right extremity was performed according to the standard protocol following intravenous contrast administration. COMPARISON:  None. CONTRAST:  73mL OMNIPAQUE IOHEXOL 300 MG/ML  SOLN FINDINGS: Bones/Joint/Cartilage No fracture or dislocation. No areas of cortical destruction or periosteal reaction. Ill-defined sclerotic lesion within the tibial metadiaphysis, likely benign fibro-osseous lesion. 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 A small knee joint effusion is. Ligaments Suboptimally assessed  by CT. Muscles and Tendons The muscles surrounding the lower extremity are intact without focal atrophy or tear. The visualized portions of the tendons are intact. Soft tissues Extensive subcutaneous edema seen surrounding the lower extremity extending to the deep fascial layers. Non loculated fluid seen within the subcutaneous soft tissues. No large loculated collections or subcutaneous emphysema. Overlying skin thickening is seen. IMPRESSION: Findings suggestive of diffuse extensive cellulitis involving the lower extremity. Non loculated fluid/phlegmon within the subcutaneous tissues. No evidence abscess or osteomyelitis. Electronically Signed   By: Prudencio Pair M.D.   On: 02/05/2020 17:39     Subjective: She is alert, feeling better. Tolerating diet.   Discharge Exam: Vitals:   02/27/20 0821 02/27/20 0828  BP: 134/70   Pulse: (!) 105   Resp: 19   Temp: 98.2 F (36.8 C)   SpO2: 93% 93%     General: Pt is alert, awake, not in acute distress Cardiovascular: RRR, S1/S2 +, no rubs, no gallops Respiratory: CTA  bilaterally, no wheezing, no rhonchi Abdominal: Soft, NT, ND, bowel sounds + Extremities: no edema, no cyanosis    The results of significant diagnostics from this hospitalization (including imaging, microbiology, ancillary and laboratory) are listed below for reference.     Microbiology: No results found for this or any previous visit (from the past 240 hour(s)).   Labs: BNP (last 3 results) No results for input(s): BNP in the last 8760 hours. Basic Metabolic Panel: Recent Labs  Lab 02/21/20 0907 02/22/20 0738 02/23/20 0233 02/25/20 0445 02/26/20 0940  NA 141 139 140 141 141  K 4.2 4.8 3.9 4.0 4.1  CL 95* 94* 94* 95* 93*  CO2 37* 31 36* 36* 38*  GLUCOSE 170* 131* 183* 175* 179*  BUN 15 18 20  27* 17  CREATININE <0.30* <0.30* 0.36* <0.30* 0.31*  CALCIUM 9.2 9.7 9.8 9.6 9.5  MG 1.6* 1.7  --   --   --    Liver Function Tests: No results for input(s): AST, ALT, ALKPHOS, BILITOT, PROT, ALBUMIN in the last 168 hours. No results for input(s): LIPASE, AMYLASE in the last 168 hours. No results for input(s): AMMONIA in the last 168 hours. CBC: Recent Labs  Lab 02/21/20 0907 02/21/20 0907 02/22/20 1035 02/23/20 0233 02/24/20 0731 02/25/20 0445 02/26/20 0940  WBC 6.3   < > 7.3 7.7 9.2 10.4 10.0  NEUTROABS 3.2  --  3.9 4.2 5.0  --   --   HGB 14.1   < > 15.5* 14.3 15.1* 14.4 14.8  HCT 47.8*   < > 51.7* 47.5* 49.7* 46.6* 48.1*  MCV 92.6   < > 90.5 90.3 91.5 91.7 91.3  PLT 199   < > 188 182 184 159 155   < > = values in this interval not displayed.   Cardiac Enzymes: No results for input(s): CKTOTAL, CKMB, CKMBINDEX, TROPONINI in the last 168 hours. BNP: Invalid input(s): POCBNP CBG: Recent Labs  Lab 02/26/20 2107 02/27/20 0008 02/27/20 0408 02/27/20 0755 02/27/20 1127  GLUCAP 127* 115* 118* 182* 159*   D-Dimer No results for input(s): DDIMER in the last 72 hours. Hgb A1c No results for input(s): HGBA1C in the last 72 hours. Lipid Profile No results for  input(s): CHOL, HDL, LDLCALC, TRIG, CHOLHDL, LDLDIRECT in the last 72 hours. Thyroid function studies No results for input(s): TSH, T4TOTAL, T3FREE, THYROIDAB in the last 72 hours.  Invalid input(s): FREET3 Anemia work up No results for input(s): VITAMINB12, FOLATE, FERRITIN, TIBC, IRON, RETICCTPCT in the last  72 hours. Urinalysis    Component Value Date/Time   COLORURINE YELLOW 02/01/2020 2040   APPEARANCEUR CLEAR 02/01/2020 2040   LABSPEC 1.031 (H) 02/01/2020 2040   PHURINE 5.0 02/01/2020 2040   GLUCOSEU NEGATIVE 02/01/2020 2040   HGBUR NEGATIVE 02/01/2020 2040   BILIRUBINUR NEGATIVE 02/01/2020 2040   KETONESUR NEGATIVE 02/01/2020 2040   PROTEINUR NEGATIVE 02/01/2020 2040   UROBILINOGEN 1.0 10/31/2012 1157   NITRITE NEGATIVE 02/01/2020 2040   LEUKOCYTESUR NEGATIVE 02/01/2020 2040   Sepsis Labs Invalid input(s): PROCALCITONIN,  WBC,  LACTICIDVEN Microbiology No results found for this or any previous visit (from the past 240 hour(s)).   Time coordinating discharge: 40 minutes  SIGNED:   Elmarie Shiley, MD  [facility]

## 2020-02-27 NOTE — Progress Notes (Signed)
Occupational Therapy Treatment Patient Details Name: [name] [name] MRN: [id number] DOB: February 16, 1956 Today's Date: 02/27/2020    History of present illness 64 yo admitted 6/5 with RLE wound, sepsis due to cellulitis. Pt with Afib with RVR and required intubation (9/7-9/17, 9/17-9/23). Transfer to [facility] 9/19 for possible trach and potential amputation. As of 9/28 amputation currently being avoided. PMHx: smoker, COPD on home O2, bipolar, DM, HTN   OT comments  Patient continues to make steady progress towards goals in skilled OT session. Patient's session encompassed functional mobility in order to increase overall activity tolerance and functional mobility. Pt up in chair upon arrival, and motivated to complete preparatory exercises prior to multiple bouts of sit<>stand transfers. Pt continues to require cues for appropriate hand placement and walker management, but demonstrated only 1 instance of minimal LOB in standing upon initial sit to stand transfer. Pt able to march in place, shift base of support back and forth, and advance forward and backwards with no further LOB noted. Discharge remains appropriate in order to regain strength and increase functional independence; will continue to follow acutely.    Follow Up Recommendations  SNF;Supervision/Assistance - 24 hour    Equipment Recommendations  Other (comment) (TBD at next venue)    Recommendations for Other Services      Precautions / Restrictions Precautions Precautions: Fall Precaution Comments: RLE wounds Restrictions Weight Bearing Restrictions: No RLE Weight Bearing: Weight bearing as tolerated       Mobility Bed Mobility               General bed mobility comments: up in chair upon arrival  Transfers Overall transfer level: Needs assistance Equipment used: Rolling walker (2 wheeled) Transfers: Sit to/from Stand Sit to Stand: Min assist         General transfer comment: Cues for hand placement and  safety; Min assist at initial stand due to incr sway and small losses of balance posteriorly; cues for anterior wieght shift, and to "push" RW into the ground, increased ability to regain balance in session to date, with minimal posterior LOB with initial sit to stand, but increased ability to maintain upright balance with final 2 sit<>stands    Balance           Standing balance support: Bilateral upper extremity supported Standing balance-Leahy Scale: Poor Standing balance comment: BUE support and minA                           ADL either performed or assessed with clinical judgement   ADL Overall ADL's : Needs assistance/impaired                     Lower Body Dressing: Maximal assistance Lower Body Dressing Details (indicate cue type and reason): Pt attempting figure 4 to don socks, would benefit from sock aid             Functional mobility during ADLs: Moderate assistance General ADL Comments: Continues to make functional gains     Vision       Perception     Praxis      Cognition Arousal/Alertness: Awake/alert Behavior During Therapy: WFL for tasks assessed/performed Overall Cognitive Status: Impaired/Different from baseline Area of Impairment: Memory;Safety/judgement;Problem solving                     Memory: Decreased recall of precautions;Decreased short-term memory   Safety/Judgement: Decreased awareness of safety   Problem Solving:  Slow processing;Decreased initiation;Difficulty sequencing;Requires tactile cues;Requires verbal cues General Comments: pt following commands, but needing increased cues for sequencing and safety        Exercises     Shoulder Instructions       General Comments      Pertinent Vitals/ Pain       Pain Assessment: No/denies pain Faces Pain Scale: No hurt  Home Living                                          Prior Functioning/Environment              Frequency   Min 2X/week        Progress Toward Goals  OT Goals(current goals can now be found in the care plan section)  Progress towards OT goals: Progressing toward goals  Acute Rehab OT Goals Patient Stated Goal: regain strength and return home OT Goal Formulation: With patient Time For Goal Achievement: 03/05/20 Potential to Achieve Goals: [place] Discharge plan remains appropriate    Co-evaluation                 AM-PAC OT "6 Clicks" Daily Activity     Outcome Measure   Help from another person eating meals?: A Little Help from another person taking care of personal grooming?: A Little Help from another person toileting, which includes using toliet, bedpan, or urinal?: A Little Help from another person bathing (including washing, rinsing, drying)?: A Little Help from another person to put on and taking off regular upper body clothing?: A Little Help from another person to put on and taking off regular lower body clothing?: A Lot 6 Click Score: 17    End of Session Equipment Utilized During Treatment: Gait belt;Oxygen;Rolling walker  OT Visit Diagnosis: Other abnormalities of gait and mobility (R26.89);Muscle weakness (generalized) (M62.81);Other symptoms and signs involving cognitive function   Activity Tolerance Patient tolerated treatment well   Patient Left in chair;with call bell/phone within reach;with chair alarm set   Nurse Communication Mobility status        Time: 0352-4818 OT Time Calculation (min): 25 min  Charges: OT General Charges $OT Visit: 1 Visit OT Treatments $Self Care/Home Management : 23-37 mins  [place]. [place], [place] [facility] [place] 02/27/2020, 1:46 PM

## 2020-02-27 NOTE — TOC Transition Note (Addendum)
Transition of Care [facility]) - CM/SW Discharge Note   Patient Details  Name: Ayrianna Mcginniss MRN: [id number] Date of Birth: September 06, 1955  Transition of Care [facility] CM/SW Contact:  Zenon Mayo, RN Phone Number: 02/27/2020, 1:04 PM   Clinical Narrative:    NCM spoke with staff RN and patient daughter, staff RN educating daughter on how to do dressing changes, daughter states she will need BSC and rolling walker and w/chair. Daughter states she does not have a preference of which agency to use for HHPT.  NCM made referral to [facility] with [facility].  Awaiting to hear back to see if he can take referral.  NCM made rerferral to [facility] with [facility] for 3 n 1 and rolling walker and w/chair.  NCM received call back from [facility] stating they do not have any wheelchairs.  NCM informed the daughter she would like for NCM to see if another agency has wheelchairs. NCM informed daughter all the other agency's, [facility], [facility] , does not have any wheelchairs available. [place] does not have one for her size.Marland Kitchen   10/7- Per Tommi Rumps with [facility] , can not take referral for HHPT.  NCM made referral to [place] with [facility], she could not take referral, Made referral to [facility] with [facility] awaiting call back. NCM made referral out to [facility]. Also.  NCM informed daughter that may not be able to get HHPT referral.  Asked daughter if she wanted patient to do outpatient physical therapy, she states sure she would just have patient's transportation to take her there would prefer the one on [address].    Final next level of care: [place] Barriers to Discharge: No Barriers Identified   Patient Goals and CMS Choice        Discharge Placement                       Discharge Plan and Services                DME Arranged: 3-N-1, Walker rolling DME Agency: [facility] Date DME Agency Contacted: 02/27/20 Time DME Agency Contacted: [id number] Representative spoke with at DME Agency: [place]: PT [place]: [place] Date [place]: 02/27/20 Time [place]: [place] Representative spoke with at [place]: [place] ([place]) Interventions     Readmission Risk Interventions No flowsheet data found.

## 2020-02-27 NOTE — Telephone Encounter (Signed)
Please see below.

## 2020-02-27 NOTE — Plan of Care (Signed)
  Problem: Nutrition: Goal: Adequate nutrition will be maintained Outcome: Progressing   

## 2020-02-27 NOTE — Plan of Care (Signed)
  Problem: Health Behavior/Discharge Planning: Goal: Ability to manage health-related needs will improve Outcome: Adequate for Discharge   Problem: Clinical Measurements: Goal: Ability to maintain clinical measurements within normal limits will improve Outcome: Adequate for Discharge Goal: Will remain free from infection Outcome: Adequate for Discharge Goal: Diagnostic test results will improve Outcome: Adequate for Discharge Goal: Respiratory complications will improve Outcome: Adequate for Discharge Goal: Cardiovascular complication will be avoided Outcome: Adequate for Discharge   Problem: Activity: Goal: Risk for activity intolerance will decrease Outcome: Adequate for Discharge   Problem: Nutrition: Goal: Adequate nutrition will be maintained 02/27/2020 1214 by Paulla Fore, RN Outcome: Adequate for Discharge 02/27/2020 0841 by Paulla Fore, RN Outcome: Progressing   Problem: Coping: Goal: Level of anxiety will decrease Outcome: Adequate for Discharge   Problem: Elimination: Goal: Will not experience complications related to bowel motility Outcome: Adequate for Discharge Goal: Will not experience complications related to urinary retention Outcome: Adequate for Discharge   Problem: Pain Managment: Goal: General experience of comfort will improve Outcome: Adequate for Discharge   Problem: Safety: Goal: Ability to remain free from injury will improve Outcome: Adequate for Discharge   Problem: Skin Integrity: Goal: Risk for impaired skin integrity will decrease Outcome: Adequate for Discharge   Problem: Activity: Goal: Ability to tolerate increased activity will improve Outcome: Adequate for Discharge   Problem: Respiratory: Goal: Ability to maintain a clear airway and adequate ventilation will improve Outcome: Adequate for Discharge   Problem: Role Relationship: Goal: Method of communication will improve Outcome: Adequate for Discharge

## 2020-02-27 NOTE — Telephone Encounter (Signed)
[name] from [facility] called she stated patient is being discharged today and is requesting discharge orders for her wound care call [phone]

## 2020-02-27 NOTE — Progress Notes (Signed)
PMT provider f/u with patient and daughter, [name] at bedside. They are eager for her discharge home today.   [name] requests advance directive packet. Patient, daughter, and I discussed the importance of continuing conversations based on her goals and wishes with chronic, irreversible lung disease. They understand the importance of completing AD/HCPOA paperwork and plan to complete in the near future.   Reviewed AD packet and MOST form with daughter.   Referral placed for outpatient palliative f/u on discharge.   NO CHARGE  Ihor Dow, [place], FNP-C Palliative Medicine Team  Phone: [phone] Fax: [phone]

## 2020-03-05 ENCOUNTER — Ambulatory Visit (INDEPENDENT_AMBULATORY_CARE_PROVIDER_SITE_OTHER): Payer: Medicaid Other | Admitting: Orthopedic Surgery

## 2020-03-05 DIAGNOSIS — I872 Venous insufficiency (chronic) (peripheral): Secondary | ICD-10-CM | POA: Diagnosis not present

## 2020-03-10 ENCOUNTER — Encounter: Payer: Self-pay | Admitting: Orthopedic Surgery

## 2020-03-10 NOTE — Progress Notes (Signed)
Office Visit Note   Patient: [name] [name]           Date of Birth: November 21, 1955           MRN: [id number] Visit Date: 03/05/2020              Requested by: Leonard Downing, MD [address] [place],  [place] [address] PCP: Leonard Downing, MD  Chief Complaint  Patient presents with  . Follow-up    f/u right leg infection      HPI: Patient is a 64 year old woman who presents follow-up for cellulitis of the right leg she was seen in the hospital she states she still has some redness denies any drainage she states she completed her antibiotics while in the hospital.  Patient states she has stopped smoking.  Assessment & Plan: Visit Diagnoses:  1. Venous insufficiency (chronic) (peripheral)     Plan: Patient was given a sample of a 20 to 30 mm of compression knee-high stocking she was given a prescription to obtain a 15 to 20 mm compression stocking.  Follow-Up Instructions: Return in about 2 weeks (around 03/19/2020).   Ortho Exam  Patient is alert, oriented, no adenopathy, well-dressed, normal affect, normal respiratory effort. Examination patient's dermatitis is resolving there is no cellulitis there is no tenderness to palpation there is no open wounds no drainage.  The calf measures 38 cm in circumference.  Imaging: No results found. No images are attached to the encounter.  Labs: Lab Results  Component Value Date   HGBA1C 5.8 (H) 01/26/2020   HGBA1C 5.6 04/12/2016   HGBA1C 6.8 (H) 08/29/2012   ESRSEDRATE 55 (H) 01/28/2020   ESRSEDRATE 52 (H) 01/27/2020   ESRSEDRATE 40 (H) 01/26/2020   CRP 68.5 (H) 01/28/2020   CRP 44 (H) 01/27/2020   CRP 43.8 (H) 01/26/2020   REPTSTATUS 02/12/2020 FINAL 02/10/2020   GRAMSTAIN  02/10/2020    RARE WBC PRESENT,BOTH PMN AND MONONUCLEAR RARE GRAM POSITIVE COCCI Performed at [facility], [place] [address], [place], [place] [address]    CULT  02/10/2020    ABUNDANT ACINETOBACTER CALCOACETICUS/BAUMANNII  COMPLEX   LABORGA ACINETOBACTER CALCOACETICUS/BAUMANNII COMPLEX 02/10/2020     Lab Results  Component Value Date   ALBUMIN 2.4 (L) 02/11/2020   ALBUMIN 2.2 (L) 02/10/2020   ALBUMIN 2.1 (L) 02/09/2020    Lab Results  Component Value Date   MG 1.7 02/22/2020   MG 1.6 (L) 02/21/2020   MG 1.9 02/20/2020   No results found for: VD25OH  No results found for: PREALBUMIN CBC EXTENDED Latest Ref Rng & Units 02/26/2020 02/25/2020 02/24/2020  WBC 4.0 - 10.5 K/uL 10.0 10.4 9.2  RBC 3.87 - 5.11 MIL/uL 5.27(H) 5.08 5.43(H)  HGB 12.0 - 15.0 g/dL 14.8 14.4 15.1(H)  HCT 36 - 46 % 48.1(H) 46.6(H) 49.7(H)  PLT 150 - 400 K/uL 155 159 184  NEUTROABS 1.7 - 7.7 K/uL - - 5.0  LYMPHSABS 0.7 - 4.0 K/uL - - 3.3     There is no height or weight on file to calculate BMI.  Orders:  No orders of the defined types were placed in this encounter.  No orders of the defined types were placed in this encounter.    Procedures: No procedures performed  Clinical Data: No additional findings.  ROS:  All other systems negative, except as noted in the HPI. Review of Systems  Objective: Vital Signs: There were no vitals taken for this visit.  Specialty Comments:  No  specialty comments available.  PMFS History: Patient Active Problem List   Diagnosis Date Noted  . Dysphagia   . Adult failure to thrive   . Dyspnea   . Necrotizing fasciitis of lower leg ([place])   . Hypoxia   . Petechial rash   . Toe infection   . Nasogastric tube present   . Palliative care by specialist   . Goals of care, counseling/discussion   . DNR (do not resuscitate)   . Atrial fibrillation with RVR ([place]) 02/05/2020  . Pleural effusion on right 02/05/2020  . Leukocytosis 02/05/2020  . Gangrene of toe of right foot ([place])   . Cellulitis of right lower leg   . Sepsis ([place]) 01/26/2020  . COPD still smoking/ pft's pending  12/27/2012  . Chronic respiratory failure ([place]) 12/27/2012  . Personal history of colonic polyps  12/11/2012  . Chronic diarrhea 12/11/2012  . Benign neoplasm of colon 12/11/2012  . Migraine 12/04/2012  . Alcohol abuse 12/04/2012  . Gait instability 08/29/2012  . Tremor 08/29/2012  . Hyperlipidemia 07/08/2012  . Acute respiratory failure with hypoxia and hypercapnia ([place]) 07/08/2012  . COPD exacerbation ([place]) 04/13/2012  . Acute respiratory failure ([place]) 04/13/2012  . Low back pain 04/13/2012  . Thrombocytopenia ([place]) 04/13/2012  . Non-insulin dependent type 2 diabetes mellitus ([place]) 04/13/2012  . Tobacco abuse 04/13/2012  . Hypertension   . Bipolar 1 disorder ([place])   . Insomnia   . Depression   . Suicidal thoughts    Past Medical History:  Diagnosis Date  . Anxiety   . Bipolar 1 disorder ([place])   . Cancer ([place])    colon  . Chronic diarrhea    Followed by Dr Michail Sermon   . Complication of anesthesia    Hx resp distress during surgery  . COPD (chronic obstructive pulmonary disease) ([place])   . Depression   . Diabetes mellitus ([place])   . External hemorrhoids   . GI bleed   . Headache(784.0)   . Hyperlipidemia   . Hypertension   . Hypothyroidism   . Insomnia   . Internal hemorrhoids   . Polysubstance abuse ([place]) Quit 2008    H/O methamphetamine and alcohol abuse, clean x 6 years  . Shortness of breath   . Suicidal thoughts   . Tobacco abuse   . Tubular adenoma of colon     Family History  Problem Relation Age of Onset  . Multiple sclerosis Mother   . Alcoholism Father   . Depression Brother   . Emphysema Maternal Grandfather        smoked  . Breast cancer Neg Hx   . Colon cancer Neg Hx   . Esophageal cancer Neg Hx   . Colon polyps Neg Hx   . Stomach cancer Neg Hx   . Pancreatic cancer Neg Hx   . Liver disease Neg Hx     Past Surgical History:  Procedure Laterality Date  . ABDOMINAL HYSTERECTOMY    . APPENDECTOMY    . COLON SURGERY     "colon repair"  . COLONOSCOPY WITH PROPOFOL N/A 12/11/2012   Procedure: COLONOSCOPY WITH PROPOFOL;  Surgeon: Jerene Bears, MD;  Location: [facility];  Service: Gastroenterology;  Laterality: N/A;  . FINGER SURGERY     middle left finger  . HERNIA REPAIR     x 2  . KNEE SURGERY Left    Left knee tendon surgery  . TONSILLECTOMY    . TUBAL LIGATION    . WOUND  DEBRIDEMENT Right 02/06/2020   Procedure: SKIN BIOPSY WITH DEBRIDEMENT RIGHT LOWER LEG;  Surgeon: Virl Cagey, MD;  Location: [facility];  Service: General;  Laterality: Right;   Social History   Occupational History  . Occupation: Disabled.  Tobacco Use  . Smoking status: Current Every Day Smoker    Packs/day: 1.00    Years: 42.00    Pack years: 42.00    Types: Cigarettes  . Smokeless tobacco: Never Used  Substance and Sexual Activity  . Alcohol use: Yes    Comment: occ  . Drug use: No  . Sexual activity: Not on file

## 2020-03-23 ENCOUNTER — Ambulatory Visit (HOSPITAL_COMMUNITY): Payer: Medicaid Other | Attending: Internal Medicine | Admitting: Physical Therapy

## 2020-04-20 ENCOUNTER — Encounter (HOSPITAL_BASED_OUTPATIENT_CLINIC_OR_DEPARTMENT_OTHER): Payer: Medicaid Other | Admitting: Internal Medicine

## 2020-10-23 ENCOUNTER — Other Ambulatory Visit: Payer: Self-pay

## 2020-10-23 ENCOUNTER — Emergency Department (HOSPITAL_COMMUNITY): Payer: Medicaid Other

## 2020-10-23 ENCOUNTER — Emergency Department (HOSPITAL_COMMUNITY)
Admission: EM | Admit: 2020-10-23 | Discharge: 2020-10-23 | Disposition: A | Discharge status: Home / Self Care | Payer: Medicaid Other | Attending: Emergency Medicine | Admitting: Emergency Medicine

## 2020-10-23 ENCOUNTER — Encounter (HOSPITAL_COMMUNITY): Payer: Self-pay | Admitting: *Deleted

## 2020-10-23 DIAGNOSIS — I10 Essential (primary) hypertension: Secondary | ICD-10-CM | POA: Insufficient documentation

## 2020-10-23 DIAGNOSIS — E119 Type 2 diabetes mellitus without complications: Secondary | ICD-10-CM | POA: Insufficient documentation

## 2020-10-23 DIAGNOSIS — E039 Hypothyroidism, unspecified: Secondary | ICD-10-CM | POA: Insufficient documentation

## 2020-10-23 DIAGNOSIS — F17210 Nicotine dependence, cigarettes, uncomplicated: Secondary | ICD-10-CM | POA: Insufficient documentation

## 2020-10-23 DIAGNOSIS — R0602 Shortness of breath: Secondary | ICD-10-CM | POA: Diagnosis present

## 2020-10-23 DIAGNOSIS — Z20822 Contact with and (suspected) exposure to covid-19: Secondary | ICD-10-CM | POA: Diagnosis not present

## 2020-10-23 DIAGNOSIS — Z79899 Other long term (current) drug therapy: Secondary | ICD-10-CM | POA: Diagnosis not present

## 2020-10-23 DIAGNOSIS — Z85038 Personal history of other malignant neoplasm of large intestine: Secondary | ICD-10-CM | POA: Diagnosis not present

## 2020-10-23 DIAGNOSIS — J441 Chronic obstructive pulmonary disease with (acute) exacerbation: Secondary | ICD-10-CM | POA: Diagnosis not present

## 2020-10-23 LAB — CBC WITH DIFFERENTIAL/PLATELET
Abs Immature Granulocytes: 0.1 10*3/uL — ABNORMAL HIGH (ref 0.00–0.07)
Basophils Absolute: 0.1 10*3/uL (ref 0.0–0.1)
Basophils Relative: 1 %
Eosinophils Absolute: 0.2 10*3/uL (ref 0.0–0.5)
Eosinophils Relative: 3 %
HCT: 43.8 % (ref 36.0–46.0)
Hemoglobin: 13.5 g/dL (ref 12.0–15.0)
Immature Granulocytes: 1 %
Lymphocytes Relative: 23 %
Lymphs Abs: 1.7 10*3/uL (ref 0.7–4.0)
MCH: 27.9 pg (ref 26.0–34.0)
MCHC: 30.8 g/dL (ref 30.0–36.0)
MCV: 90.5 fL (ref 80.0–100.0)
Monocytes Absolute: 0.4 10*3/uL (ref 0.1–1.0)
Monocytes Relative: 5 %
Neutro Abs: 4.8 10*3/uL (ref 1.7–7.7)
Neutrophils Relative %: 67 %
Platelets: 195 10*3/uL (ref 150–400)
RBC: 4.84 MIL/uL (ref 3.87–5.11)
RDW: 13.9 % (ref 11.5–15.5)
WBC: 7.2 10*3/uL (ref 4.0–10.5)
nRBC: 0 % (ref 0.0–0.2)

## 2020-10-23 LAB — BASIC METABOLIC PANEL
Anion gap: 9 (ref 5–15)
BUN: 16 mg/dL (ref 8–23)
CO2: 34 mmol/L — ABNORMAL HIGH (ref 22–32)
Calcium: 9.3 mg/dL (ref 8.9–10.3)
Chloride: 97 mmol/L — ABNORMAL LOW (ref 98–111)
Creatinine, Ser: 0.51 mg/dL (ref 0.44–1.00)
GFR, Estimated: >60 mL/min (ref >60)
Glucose, Bld: 151 mg/dL — ABNORMAL HIGH (ref 70–99)
Potassium: 4 mmol/L (ref 3.5–5.1)
Sodium: 140 mmol/L (ref 135–145)

## 2020-10-23 LAB — BRAIN NATRIURETIC PEPTIDE: B Natriuretic Peptide: 31 pg/mL (ref 0.0–100.0)

## 2020-10-23 LAB — TROPONIN I (HIGH SENSITIVITY)
Troponin I (High Sensitivity): 4 ng/L (ref <18)
Troponin I (High Sensitivity): 4 ng/L (ref <18)

## 2020-10-23 LAB — RESP PANEL BY RT-PCR (FLU A&B, COVID) ARPGX2
Influenza A by PCR: NEGATIVE
Influenza B by PCR: NEGATIVE
SARS Coronavirus 2 by RT PCR: NEGATIVE

## 2020-10-23 MED ORDER — IPRATROPIUM-ALBUTEROL 0.5-2.5 (3) MG/3ML IN SOLN
3.0000 mL | Freq: Once | RESPIRATORY_TRACT | Status: DC
  Filled 2020-10-23: qty 3

## 2020-10-23 MED ORDER — ALBUTEROL SULFATE (2.5 MG/3ML) 0.083% IN NEBU
2.5000 mg | INHALATION_SOLUTION | Freq: Once | RESPIRATORY_TRACT | Status: DC
  Filled 2020-10-23: qty 3

## 2020-10-23 MED ORDER — ALBUTEROL SULFATE HFA 108 (90 BASE) MCG/ACT IN AERS
2.0000 | INHALATION_SPRAY | RESPIRATORY_TRACT | Status: DC | PRN
  Administered 2020-10-23: 2 via RESPIRATORY_TRACT
  Filled 2020-10-23: qty 6.7

## 2020-10-23 NOTE — ED Notes (Signed)
Pt reports she wears 2LNC PRN at baseline. Placed pt on room air before ambulation, pt sat on side of bed and 02 sats dropped to 88% on room air, pt placed on [facility] for ambulation, o2 sats as low as 86% on 2LNC, HR between 99-106 ambulating. EDP made aware.

## 2020-10-23 NOTE — ED Provider Notes (Cosign Needed)
Emergency Medicine Provider Triage Evaluation Note  [name] [name] , a 65 y.o. female  was evaluated in triage.  Patient has history of hypertension, COPD, sepsis, atrial fibrillation with RVR she complains of worsening shortness of breath x2 weeks.  She is on continuous oxygen at 2 L by nasal cannula.  Describes having a pressure sensation of her left chest and difficulty breathing.  No cough or hemoptysis.  She was admitted into the hospital for sepsis secondary to a foot and toe infection and subsequently developed  respiratory failure with hypoxia and hypercapnia September 2021 that required prolonged mechanical ventilation.  Currently, her shortness of breath worsens with exertion or speaking.  No known fever or chills, lower extremity edema   Review of Systems  Positive: Chest pain and shortness of breath Negative: Fever, chills, lower extremity swelling  Physical Exam  BP (!) 144/79 (BP Location: Right Arm)   Pulse (!) 107   Temp 100 F (37.8 C) (Oral)   Resp 20   SpO2 91%  Gen:   Awake, no distress   Resp:  Normal effort, lungs clear to auscultation bilaterally, patient on oxygen by nasal cannula at 2 L MSK:   Moves extremities without difficulty, no peripheral edema Other:    Medical Decision Making  Medically screening exam initiated at 4:10 PM.  Appropriate orders placed.  Maitland Muhlbauer was informed that the remainder of the evaluation will be completed by another provider, this initial triage assessment does not replace that evaluation, and the importance of remaining in the ED until their evaluation is complete.  Patient with significant comorbidities, here with increasing shortness of breath currently on continuous oxygen by nasal cannula at 2 L.  She will need further evaluation in the emergency department.  She is agreeable to plan.   Kem Parkinson, PA-C 10/23/20 1623

## 2020-10-23 NOTE — ED Provider Notes (Signed)
Pt signed out by Kem Parkinson, PA-C, sob with chronic home O2, COPD, worsening sob x 2 weeks. No chest pain.  Pending second troponin.  Pt was ambulated in dept on her chronic oxygen - her O2 sats dropped to 86% and her pulse increased 99-106 per nursing staff.  She has no active wheezing at re-exam, received albuterol mdi here with no sig improvement in her breathing.  She is motivated to go home at this time - offered prednisone which she defers, stating she does not like this medicine.    Ordered albuterol/atrovent neb.  Prior to being able to go back in to talk to pt, she had left the dept.      Evalee Jefferson, PA-C 10/23/20 2123    Daleen Bo, MD 10/24/20 1105

## 2020-10-23 NOTE — ED Notes (Signed)
Pts daughter arrived to visit pt, then decided to walk pt out of the ED.

## 2020-10-23 NOTE — ED Triage Notes (Signed)
Shortness of breath for a month, worse for the past 2 weeks

## 2020-10-23 NOTE — ED Notes (Signed)
Visitor storms out of department "you guys are taking way too long, she already said she is going to be discharged, it's just a COPD flare-up and she can leave", educated and explained awaiting discharge, patient and visitor left.

## 2020-10-23 NOTE — ED Provider Notes (Cosign Needed)
[facility] EMERGENCY DEPARTMENT Provider Note   CSN: [id number] Arrival date & time: 10/23/20  1458     History Chief Complaint  Patient presents with  . Shortness of Breath    [name] [name] is a 65 y.o. female.  HPI      [name] [name] is a 65 y.o. female with past medical history of COPD, diabetes, hypertension, and atrial fib with RVR.  Wears oxygen continuously at 2 L, who presents to the Emergency Department complaining of increasing shortness of breath for 1 month, worse for 2 weeks.  Shortness of breath seems worse in the evening, but she denies waking up from sleep gasping for air.  Uses albuterol nebulizers and inhalers at home.  She was hospitalized in September 2021 for a foot infection and developed sepsis and respiratory failure and required mechanical ventilation for prolonged period of time.  She states that she had a right-sided "collapsed lung" and she is here today because she is concerned that she may be having a repeat issue with her lung.  She denies fever, chest pain, cough, and hemoptysis.      Past Medical History:  Diagnosis Date  . Anxiety   . Bipolar 1 disorder ([place])   . Cancer ([place])    colon  . Chronic diarrhea    Followed by Dr Michail Sermon   . Complication of anesthesia    Hx resp distress during surgery  . COPD (chronic obstructive pulmonary disease) ([place])   . Depression   . Diabetes mellitus ([place])   . External hemorrhoids   . GI bleed   . Headache(784.0)   . Hyperlipidemia   . Hypertension   . Hypothyroidism   . Insomnia   . Internal hemorrhoids   . Polysubstance abuse ([place]) Quit 2008    H/O methamphetamine and alcohol abuse, clean x 6 years  . Shortness of breath   . Suicidal thoughts   . Tobacco abuse   . Tubular adenoma of colon     Patient Active Problem List   Diagnosis Date Noted  . Dysphagia   . Adult failure to thrive   . Dyspnea   . Necrotizing fasciitis of lower leg ([place])   . Hypoxia   . Petechial rash   . Toe  infection   . Nasogastric tube present   . Palliative care by specialist   . Goals of care, counseling/discussion   . DNR (do not resuscitate)   . Atrial fibrillation with RVR ([place]) 02/05/2020  . Pleural effusion on right 02/05/2020  . Leukocytosis 02/05/2020  . Gangrene of toe of right foot ([place])   . Cellulitis of right lower leg   . Sepsis ([place]) 01/26/2020  . COPD still smoking/ pft's pending  12/27/2012  . Chronic respiratory failure ([place]) 12/27/2012  . Personal history of colonic polyps 12/11/2012  . Chronic diarrhea 12/11/2012  . Benign neoplasm of colon 12/11/2012  . Migraine 12/04/2012  . Alcohol abuse 12/04/2012  . Gait instability 08/29/2012  . Tremor 08/29/2012  . Hyperlipidemia 07/08/2012  . Acute respiratory failure with hypoxia and hypercapnia ([place]) 07/08/2012  . COPD exacerbation ([place]) 04/13/2012  . Acute respiratory failure ([place]) 04/13/2012  . Low back pain 04/13/2012  . Thrombocytopenia ([place]) 04/13/2012  . Non-insulin dependent type 2 diabetes mellitus ([place]) 04/13/2012  . Tobacco abuse 04/13/2012  . Hypertension   . Bipolar 1 disorder ([place])   . Insomnia   . Depression   . Suicidal thoughts     Past Surgical History:  Procedure Laterality  Date  . ABDOMINAL HYSTERECTOMY    . APPENDECTOMY    . COLON SURGERY     "colon repair"  . COLONOSCOPY WITH PROPOFOL N/A 12/11/2012   Procedure: COLONOSCOPY WITH PROPOFOL;  Surgeon: Jerene Bears, MD;  [facility];  Service: Gastroenterology;  Laterality: N/A;  . FINGER SURGERY     middle left finger  . HERNIA REPAIR     x 2  . KNEE SURGERY Left    Left knee tendon surgery  . TONSILLECTOMY    . TUBAL LIGATION    . WOUND DEBRIDEMENT Right 02/06/2020   Procedure: SKIN BIOPSY WITH DEBRIDEMENT RIGHT LOWER LEG;  Surgeon: Virl Cagey, MD;  Location: [facility];  Service: General;  Laterality: Right;     OB History   No obstetric history on file.     Family History  Problem Relation Age of Onset  .  Multiple sclerosis Mother   . Alcoholism Father   . Depression Brother   . Emphysema Maternal Grandfather        smoked  . Breast cancer Neg Hx   . Colon cancer Neg Hx   . Esophageal cancer Neg Hx   . Colon polyps Neg Hx   . Stomach cancer Neg Hx   . Pancreatic cancer Neg Hx   . Liver disease Neg Hx     Social History   Tobacco Use  . Smoking status: Current Every Day Smoker    Packs/day: 1.00    Years: 42.00    Pack years: 42.00    Types: Cigarettes  . Smokeless tobacco: Never Used  Substance Use Topics  . Alcohol use: Yes    Comment: occ  . Drug use: No    Home Medications Prior to Admission medications   Medication Sig Start Date End Date Taking? Authorizing Provider  albuterol (PROVENTIL) (5 MG/ML) 0.5% nebulizer solution Take 2.5 mg by nebulization every 6 (six) hours as needed for wheezing or shortness of breath. 04/16/12   Bonnielee Haff, MD  benztropine (COGENTIN) 0.5 MG tablet Take 1 tablet (0.5 mg total) by mouth 2 (two) times daily. 02/27/20   Regalado, Belkys A, MD  clonazePAM (KLONOPIN) 0.5 MG tablet Take 1 tablet (0.5 mg total) by mouth 2 (two) times daily for 5 days. 02/27/20 03/03/20  Regalado, Belkys A, MD  cloNIDine (CATAPRES) 0.2 MG tablet Take 1 tablet (0.2 mg total) by mouth 2 (two) times daily. 02/27/20   Regalado, Belkys A, MD  folic acid (FOLVITE) 1 MG tablet Take 1 tablet (1 mg total) by mouth daily. 02/27/20   Regalado, Belkys A, MD  guaiFENesin (ROBITUSSIN) 100 MG/5ML SOLN Take 10 mLs (200 mg total) by mouth every 4 (four) hours as needed for cough or to loosen phlegm. 02/27/20   Regalado, Belkys A, MD  pantoprazole (PROTONIX) 40 MG tablet Take 1 tablet (40 mg total) by mouth daily. 02/27/20   Regalado, Belkys A, MD  polyethylene glycol (MIRALAX / GLYCOLAX) 17 g packet Take 17 g by mouth daily. 02/27/20   Regalado, Belkys A, MD  QUEtiapine (SEROQUEL) 25 MG tablet Take 1 tablet (25 mg total) by mouth at bedtime. 02/27/20   Regalado, Belkys A, MD  sertraline  (ZOLOFT) 100 MG tablet Take 200 mg by mouth daily.     [provider]  SPIRIVA HANDIHALER 18 MCG inhalation capsule Place 1 capsule into inhaler and inhale daily.  12/17/19   [provider]  thiamine 100 MG tablet Take 1 tablet (100 mg total) by  mouth daily. 02/27/20   Regalado, Belkys A, MD  valproic acid (DEPAKENE) 250 MG capsule Take 2 capsules (500 mg total) by mouth 2 (two) times daily. 02/27/20 03/28/20  Regalado, Jerald Kief A, MD    Allergies    Bactrim [sulfamethoxazole-trimethoprim], Amoxicillin, and Diclofenac  Review of Systems   Review of Systems  Constitutional: Negative for appetite change, chills and fever.  HENT: Negative for congestion and trouble swallowing.   Respiratory: Positive for chest tightness and shortness of breath. Negative for cough and wheezing.   Cardiovascular: Negative for chest pain and leg swelling.  Gastrointestinal: Negative for abdominal pain, diarrhea, nausea and vomiting.  Genitourinary: Negative for dysuria.  Musculoskeletal: Negative for arthralgias.  Skin: Negative for rash.  Neurological: Negative for dizziness, weakness, numbness and headaches.  Hematological: Negative for adenopathy.    Physical Exam Updated Vital Signs BP 131/69   Pulse 94   Temp 100 F (37.8 C) (Oral)   Resp 15   SpO2 95%   Physical Exam Vitals and nursing note reviewed.  Constitutional:      Appearance: She is well-developed. She is not ill-appearing or toxic-appearing.  HENT:     Mouth/Throat:     Mouth: Mucous membranes are moist.  Cardiovascular:     Rate and Rhythm: Normal rate and regular rhythm.     Pulses: Normal pulses.  Pulmonary:     Effort: Pulmonary effort is normal.     Breath sounds: Normal breath sounds.  Abdominal:     Palpations: Abdomen is soft.     Tenderness: There is no abdominal tenderness.  Musculoskeletal:     Right lower leg: No edema.     Left lower leg: No edema.  Skin:    General: Skin is warm.     Capillary  Refill: Capillary refill takes less than 2 seconds.     Findings: No rash.  Neurological:     General: No focal deficit present.     Mental Status: She is alert.     Sensory: No sensory deficit.     Motor: No weakness.     ED Results / Procedures / Treatments   Labs (all labs ordered are listed, but only abnormal results are displayed) Labs Reviewed  BASIC METABOLIC PANEL - Abnormal; Notable for the following components:      Result Value   Chloride 97 (*)    CO2 34 (*)    Glucose, Bld 151 (*)    All other components within normal limits  CBC WITH DIFFERENTIAL/PLATELET - Abnormal; Notable for the following components:   Abs Immature Granulocytes 0.10 (*)    All other components within normal limits  RESP PANEL BY RT-PCR (FLU A&B, COVID) ARPGX2  BRAIN NATRIURETIC PEPTIDE  TROPONIN I (HIGH SENSITIVITY)  TROPONIN I (HIGH SENSITIVITY)    EKG EKG Interpretation  Date/Time: 10/23/20  Ventricular Rate: 108  PR Interval: 166  QRS Duration: 96 QT Interval: 346  QTC Calculation: 463 R Axis:    Text Interpretation: sinus tachycardia, nonspecific ST abnormality  EKG reviewed by Dr. Eulis Foster   Radiology DG Chest 2 View  Result Date: 10/23/2020 CLINICAL DATA:  Short of breath EXAM: CHEST - 2 VIEW COMPARISON:  02/13/2020 FINDINGS: Heart size and vascularity normal. Prominent lung markings appear consistent with chronic lung disease. No acute infiltrate or effusion. Chronic left rib fracture. IMPRESSION: No active cardiopulmonary disease. Electronically Signed   By: Franchot Gallo M.D.   On: 10/23/2020 15:49    Procedures Procedures   Medications Ordered  in ED Medications  albuterol (VENTOLIN HFA) 108 (90 Base) MCG/ACT inhaler 2 puff (has no administration in time range)    ED Course  I have reviewed the triage vital signs and the nursing notes.  Pertinent labs & imaging results that were available during my care of the patient were reviewed by me and considered in my medical  decision making (see chart for details).    MDM Rules/Calculators/A&P                          Patient here for evaluation of increasing shortness of breath.  Symptoms for 1 month, but states worse for 2 weeks.  She is on continuous oxygen at 2 L.  No reported cough, hemoptysis or peripheral edema. On exam, patient well-appearing.  Speaks in complete sentences with no increased work of breathing.  Low-grade fever on arrival and slightly tachycardic.  On repeat of vital signs.  Heart rate improved.  No hypoxia or tachypnea. EKG without evidence of atrial fib or acute ischemic change. Labs, initial troponin reassuring.  No leukocytosis or electrolyte derangement.  Elevated bicarb appears baseline.  BMP unremarkable and COVID negative.  Chest x-ray without evidence of infiltrate, pneumothorax or effusion.  [place] pending.  I have discussed work-up with Evalee Jefferson, PA-C.  If delta troponin unchanged and she ambulates without hypoxia, patient will likely be discharged and she is agreeable to follow-up with PCP.  Final Clinical Impression(s) / ED Diagnoses Final diagnoses:  COPD exacerbation [facility])    Rx / DC Orders ED Discharge Orders    None       Kem Parkinson, PA-C 10/23/20 1947

## 2021-03-25 ENCOUNTER — Other Ambulatory Visit: Payer: Self-pay | Admitting: Nurse Practitioner

## 2021-03-25 DIAGNOSIS — Z1231 Encounter for screening mammogram for malignant neoplasm of breast: Secondary | ICD-10-CM

## 2021-08-15 IMAGING — DX DG CHEST 2V
2 series · 2 of 2 positions shown · non-contrast
Comparison: 02/13/2020

CLINICAL DATA: Short of breath

EXAM:
CHEST - 2 VIEW

[chest pa]
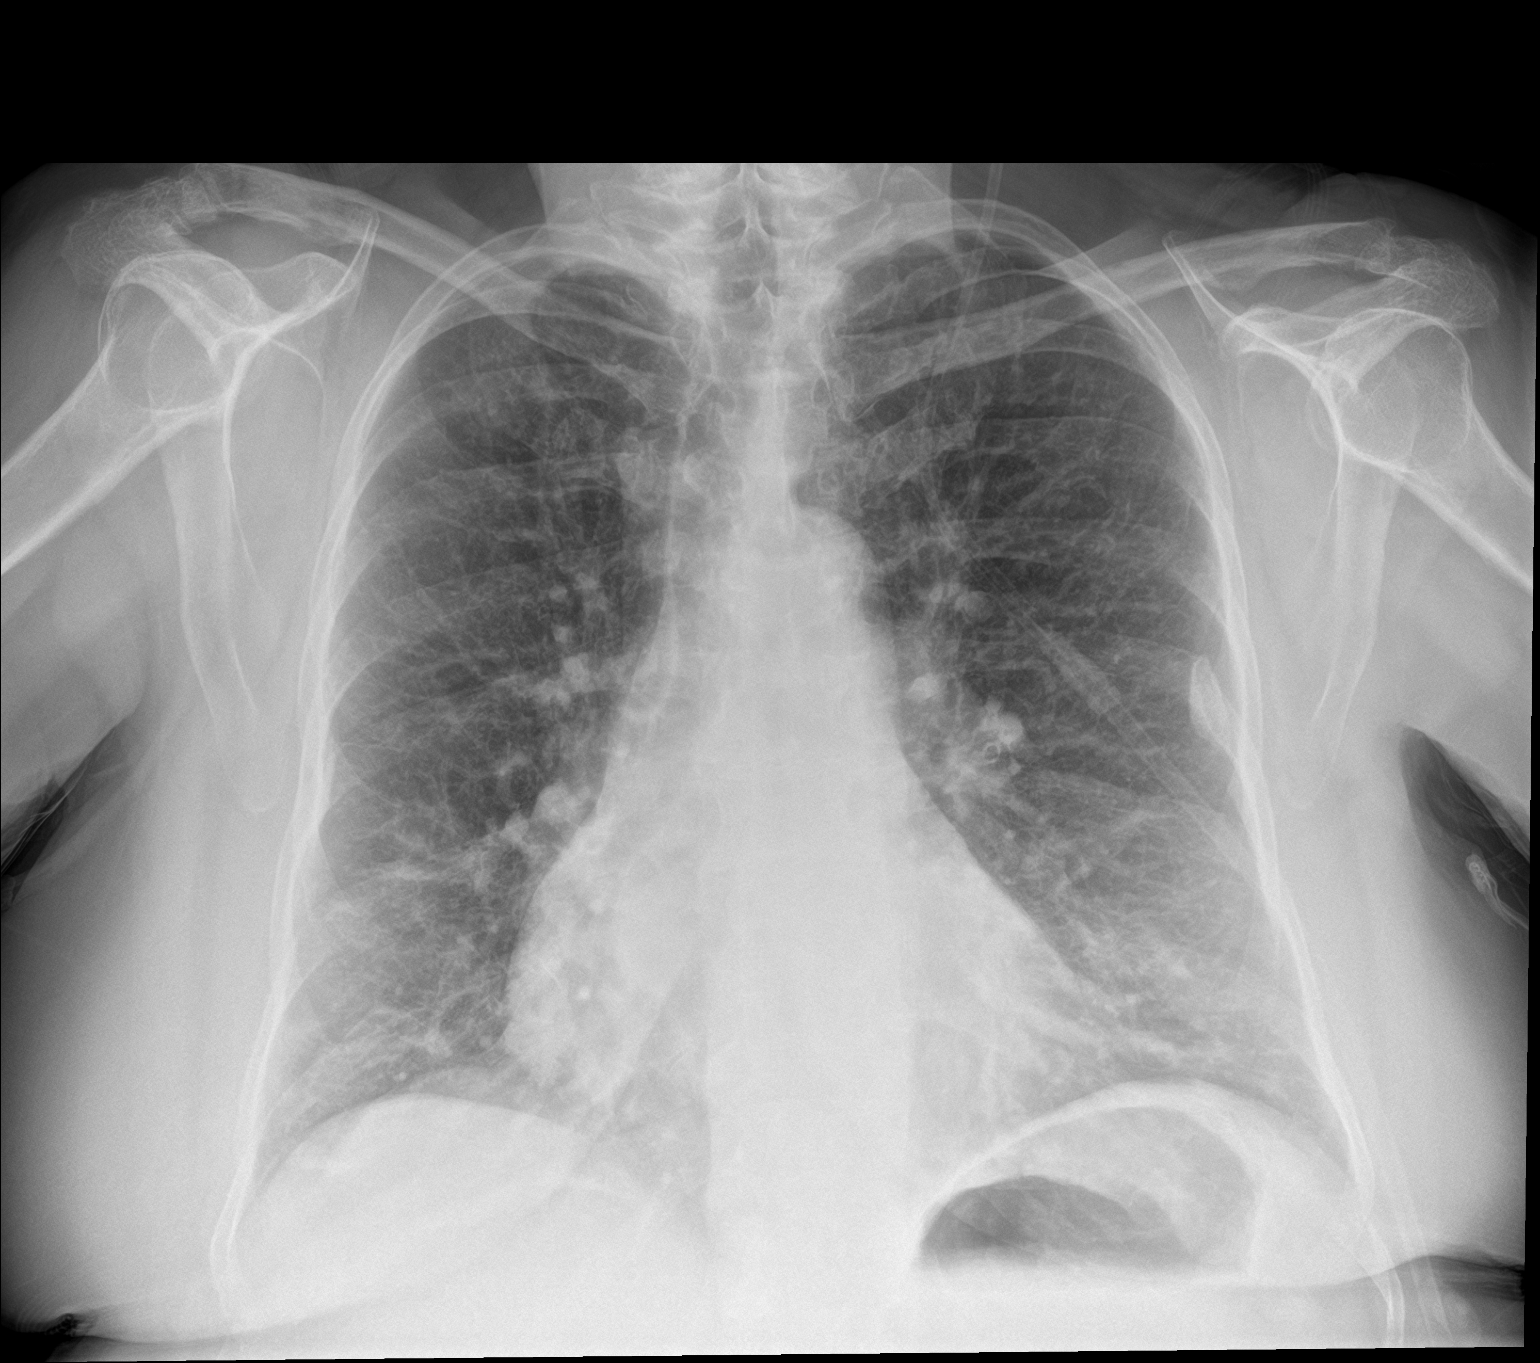

[chest lat]
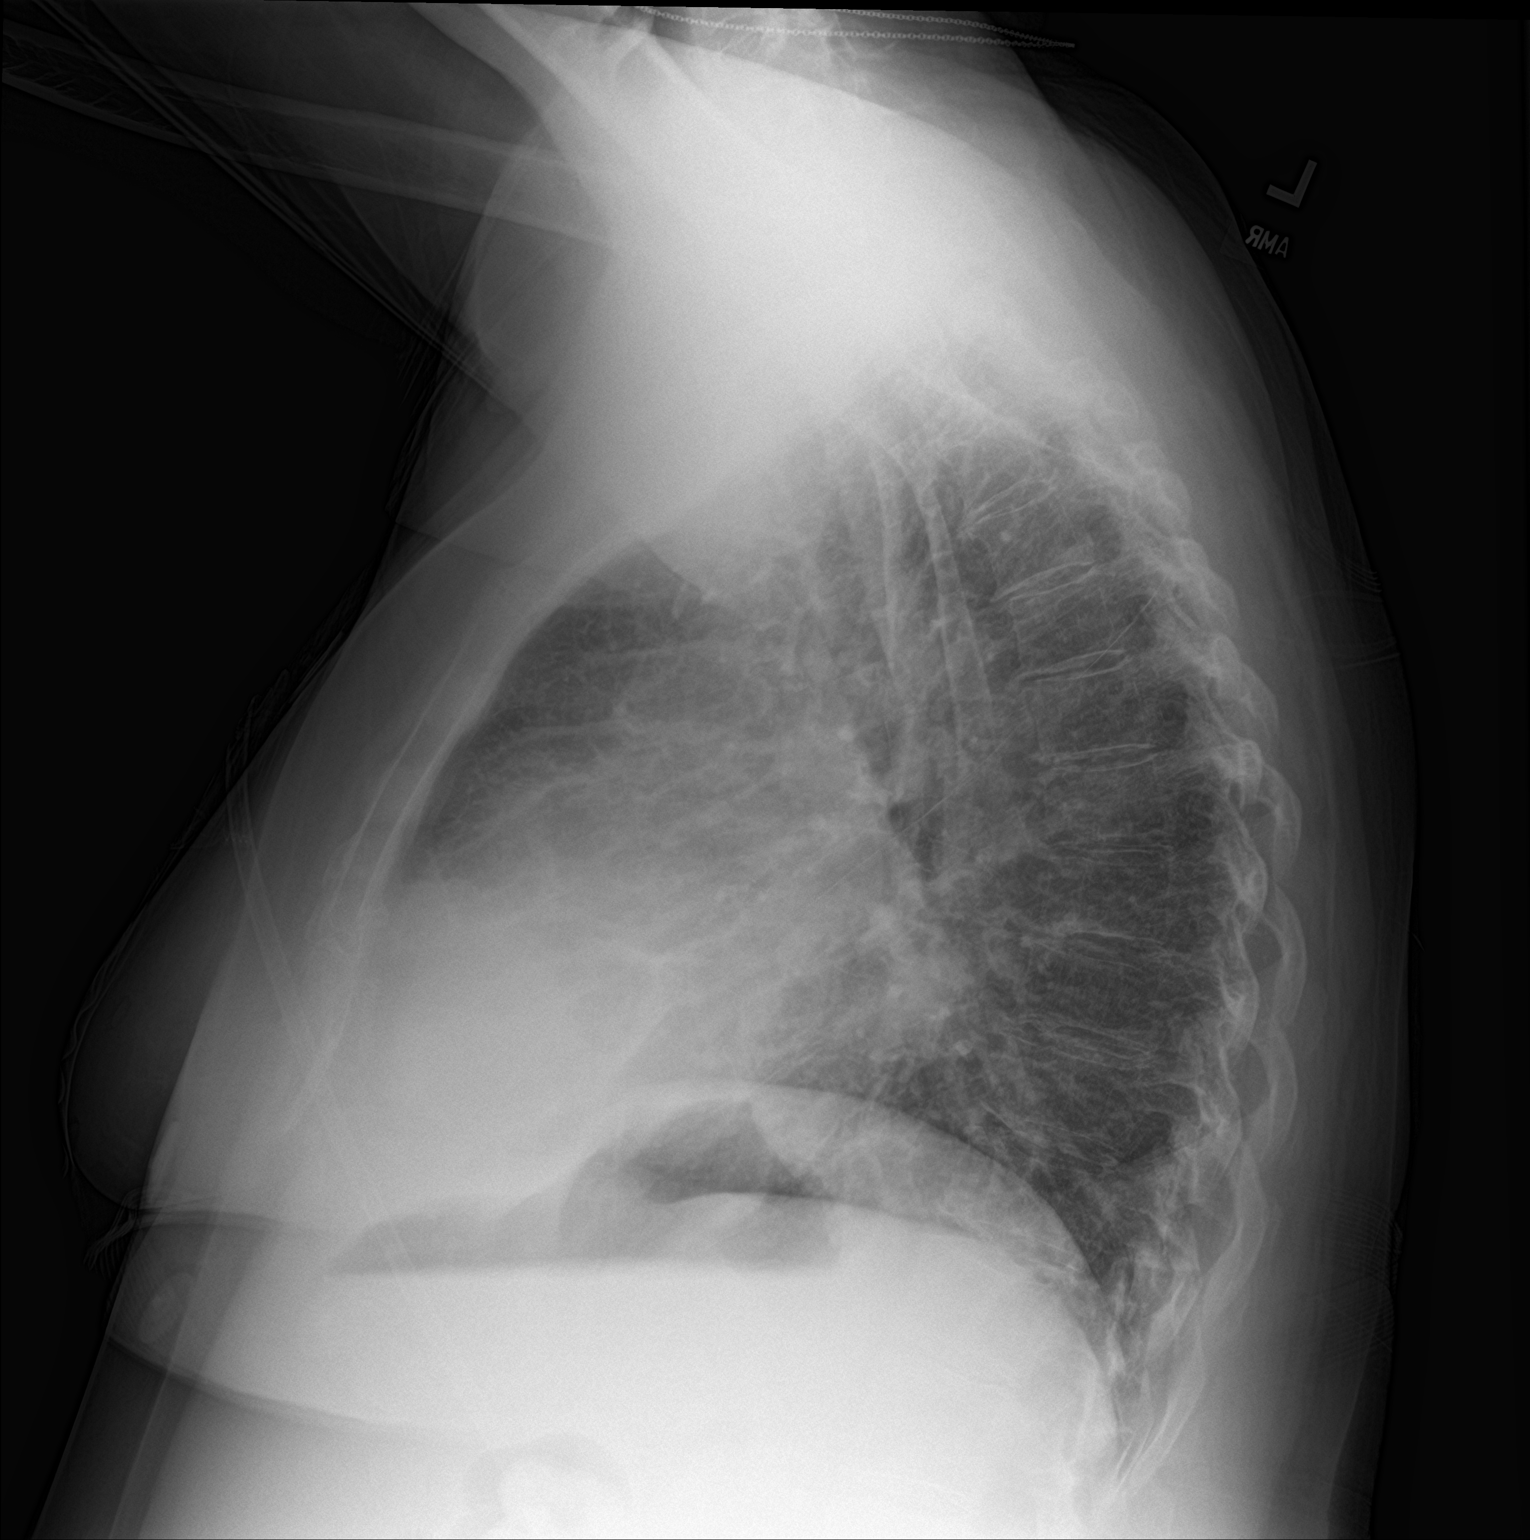

[2 of 2 positions shown; findings below may reference images not displayed]

FINDINGS: Heart size and vascularity normal. Prominent lung markings appear
consistent with chronic lung disease. No acute infiltrate or
effusion. Chronic left rib fracture.
IMPRESSION: No active cardiopulmonary disease.

## 2022-03-09 DIAGNOSIS — M25562 Pain in left knee: Secondary | ICD-10-CM | POA: Insufficient documentation

## 2022-03-09 DIAGNOSIS — M25551 Pain in right hip: Secondary | ICD-10-CM | POA: Insufficient documentation

## 2022-03-09 DIAGNOSIS — M1611 Unilateral primary osteoarthritis, right hip: Secondary | ICD-10-CM | POA: Insufficient documentation

## 2022-06-03 ENCOUNTER — Emergency Department (HOSPITAL_COMMUNITY): Payer: Medicare Other

## 2022-06-03 ENCOUNTER — Encounter (HOSPITAL_COMMUNITY): Payer: Self-pay

## 2022-06-03 ENCOUNTER — Inpatient Hospital Stay (HOSPITAL_COMMUNITY)
Admission: EM | Admit: 2022-06-03 | Discharge: 2022-06-11 | DRG: 190 | Disposition: A | Discharge status: Home / Self Care | Payer: Medicare Other | Attending: Family Medicine | Admitting: Family Medicine

## 2022-06-03 DIAGNOSIS — I110 Hypertensive heart disease with heart failure: Secondary | ICD-10-CM | POA: Diagnosis present

## 2022-06-03 DIAGNOSIS — Z79899 Other long term (current) drug therapy: Secondary | ICD-10-CM

## 2022-06-03 DIAGNOSIS — G8929 Other chronic pain: Secondary | ICD-10-CM | POA: Diagnosis present

## 2022-06-03 DIAGNOSIS — Z820 Family history of epilepsy and other diseases of the nervous system: Secondary | ICD-10-CM

## 2022-06-03 DIAGNOSIS — Z818 Family history of other mental and behavioral disorders: Secondary | ICD-10-CM

## 2022-06-03 DIAGNOSIS — G894 Chronic pain syndrome: Secondary | ICD-10-CM | POA: Diagnosis present

## 2022-06-03 DIAGNOSIS — J441 Chronic obstructive pulmonary disease with (acute) exacerbation: Secondary | ICD-10-CM | POA: Diagnosis not present

## 2022-06-03 DIAGNOSIS — Z7984 Long term (current) use of oral hypoglycemic drugs: Secondary | ICD-10-CM

## 2022-06-03 DIAGNOSIS — M179 Osteoarthritis of knee, unspecified: Secondary | ICD-10-CM | POA: Diagnosis present

## 2022-06-03 DIAGNOSIS — Z87891 Personal history of nicotine dependence: Secondary | ICD-10-CM

## 2022-06-03 DIAGNOSIS — I3481 Nonrheumatic mitral (valve) annulus calcification: Secondary | ICD-10-CM | POA: Diagnosis present

## 2022-06-03 DIAGNOSIS — J159 Unspecified bacterial pneumonia: Secondary | ICD-10-CM | POA: Diagnosis present

## 2022-06-03 DIAGNOSIS — I4892 Unspecified atrial flutter: Secondary | ICD-10-CM | POA: Diagnosis present

## 2022-06-03 DIAGNOSIS — Z23 Encounter for immunization: Secondary | ICD-10-CM

## 2022-06-03 DIAGNOSIS — J9610 Chronic respiratory failure, unspecified whether with hypoxia or hypercapnia: Secondary | ICD-10-CM | POA: Diagnosis present

## 2022-06-03 DIAGNOSIS — I5021 Acute systolic (congestive) heart failure: Secondary | ICD-10-CM | POA: Diagnosis not present

## 2022-06-03 DIAGNOSIS — E119 Type 2 diabetes mellitus without complications: Secondary | ICD-10-CM

## 2022-06-03 DIAGNOSIS — E039 Hypothyroidism, unspecified: Secondary | ICD-10-CM | POA: Diagnosis present

## 2022-06-03 DIAGNOSIS — Z811 Family history of alcohol abuse and dependence: Secondary | ICD-10-CM

## 2022-06-03 DIAGNOSIS — I483 Typical atrial flutter: Secondary | ICD-10-CM | POA: Diagnosis present

## 2022-06-03 DIAGNOSIS — Z90710 Acquired absence of both cervix and uterus: Secondary | ICD-10-CM

## 2022-06-03 DIAGNOSIS — I480 Paroxysmal atrial fibrillation: Secondary | ICD-10-CM | POA: Diagnosis present

## 2022-06-03 DIAGNOSIS — Z87892 Personal history of anaphylaxis: Secondary | ICD-10-CM

## 2022-06-03 DIAGNOSIS — I4719 Other supraventricular tachycardia: Secondary | ICD-10-CM

## 2022-06-03 DIAGNOSIS — J449 Chronic obstructive pulmonary disease, unspecified: Secondary | ICD-10-CM | POA: Diagnosis present

## 2022-06-03 DIAGNOSIS — M169 Osteoarthritis of hip, unspecified: Secondary | ICD-10-CM | POA: Diagnosis present

## 2022-06-03 DIAGNOSIS — Z888 Allergy status to other drugs, medicaments and biological substances status: Secondary | ICD-10-CM

## 2022-06-03 DIAGNOSIS — I5022 Chronic systolic (congestive) heart failure: Secondary | ICD-10-CM | POA: Diagnosis present

## 2022-06-03 DIAGNOSIS — Z884 Allergy status to anesthetic agent status: Secondary | ICD-10-CM

## 2022-06-03 DIAGNOSIS — R0781 Pleurodynia: Secondary | ICD-10-CM | POA: Insufficient documentation

## 2022-06-03 DIAGNOSIS — E785 Hyperlipidemia, unspecified: Secondary | ICD-10-CM | POA: Diagnosis present

## 2022-06-03 DIAGNOSIS — Z1152 Encounter for screening for COVID-19: Secondary | ICD-10-CM

## 2022-06-03 DIAGNOSIS — F319 Bipolar disorder, unspecified: Secondary | ICD-10-CM | POA: Diagnosis present

## 2022-06-03 DIAGNOSIS — F419 Anxiety disorder, unspecified: Secondary | ICD-10-CM | POA: Diagnosis present

## 2022-06-03 DIAGNOSIS — J440 Chronic obstructive pulmonary disease with acute lower respiratory infection: Secondary | ICD-10-CM | POA: Diagnosis present

## 2022-06-03 DIAGNOSIS — Z7901 Long term (current) use of anticoagulants: Secondary | ICD-10-CM

## 2022-06-03 DIAGNOSIS — J9601 Acute respiratory failure with hypoxia: Secondary | ICD-10-CM | POA: Diagnosis present

## 2022-06-03 DIAGNOSIS — J9621 Acute and chronic respiratory failure with hypoxia: Secondary | ICD-10-CM | POA: Diagnosis present

## 2022-06-03 DIAGNOSIS — Z9981 Dependence on supplemental oxygen: Secondary | ICD-10-CM

## 2022-06-03 DIAGNOSIS — R339 Retention of urine, unspecified: Secondary | ICD-10-CM | POA: Diagnosis present

## 2022-06-03 DIAGNOSIS — Z6372 Alcoholism and drug addiction in family: Secondary | ICD-10-CM

## 2022-06-03 DIAGNOSIS — Z86010 Personal history of colonic polyps: Secondary | ICD-10-CM

## 2022-06-03 DIAGNOSIS — Z825 Family history of asthma and other chronic lower respiratory diseases: Secondary | ICD-10-CM

## 2022-06-03 DIAGNOSIS — Z6841 Body Mass Index (BMI) 40.0 and over, adult: Secondary | ICD-10-CM

## 2022-06-03 HISTORY — DX: Necrotizing fasciitis: M72.6

## 2022-06-03 HISTORY — DX: Acute respiratory failure with hypoxia: J96.01

## 2022-06-03 HISTORY — DX: Gangrene, not elsewhere classified: I96

## 2022-06-03 HISTORY — DX: Cellulitis of right lower limb: L03.115

## 2022-06-03 LAB — CBC WITH DIFFERENTIAL/PLATELET
Abs Immature Granulocytes: 0.05 10*3/uL (ref 0.00–0.07)
Basophils Absolute: 0 10*3/uL (ref 0.0–0.1)
Basophils Relative: 1 %
Eosinophils Absolute: 0.1 10*3/uL (ref 0.0–0.5)
Eosinophils Relative: 2 %
HCT: 33.7 % — ABNORMAL LOW (ref 36.0–46.0)
Hemoglobin: 10.4 g/dL — ABNORMAL LOW (ref 12.0–15.0)
Immature Granulocytes: 1 %
Lymphocytes Relative: 19 %
Lymphs Abs: 1.1 10*3/uL (ref 0.7–4.0)
MCH: 26.9 pg (ref 26.0–34.0)
MCHC: 30.9 g/dL (ref 30.0–36.0)
MCV: 87.1 fL (ref 80.0–100.0)
Monocytes Absolute: 0.3 10*3/uL (ref 0.1–1.0)
Monocytes Relative: 5 %
Neutro Abs: 4.2 10*3/uL (ref 1.7–7.7)
Neutrophils Relative %: 72 %
Platelets: 163 10*3/uL (ref 150–400)
RBC: 3.87 MIL/uL (ref 3.87–5.11)
RDW: 15.1 % (ref 11.5–15.5)
WBC: 5.9 10*3/uL (ref 4.0–10.5)
nRBC: 0 % (ref 0.0–0.2)

## 2022-06-03 LAB — BASIC METABOLIC PANEL
Anion gap: 11 (ref 5–15)
BUN: 12 mg/dL (ref 8–23)
CO2: 31 mmol/L (ref 22–32)
Calcium: 9.3 mg/dL (ref 8.9–10.3)
Chloride: 96 mmol/L — ABNORMAL LOW (ref 98–111)
Creatinine, Ser: 0.64 mg/dL (ref 0.44–1.00)
GFR, Estimated: >60 mL/min (ref >60)
Glucose, Bld: 176 mg/dL — ABNORMAL HIGH (ref 70–99)
Potassium: 3.6 mmol/L (ref 3.5–5.1)
Sodium: 138 mmol/L (ref 135–145)

## 2022-06-03 LAB — RESP PANEL BY RT-PCR (RSV, FLU A&B, COVID)  RVPGX2
Influenza A by PCR: NEGATIVE
Influenza B by PCR: NEGATIVE
Resp Syncytial Virus by PCR: NEGATIVE
SARS Coronavirus 2 by RT PCR: NEGATIVE

## 2022-06-03 LAB — TROPONIN I (HIGH SENSITIVITY)
Troponin I (High Sensitivity): 10 ng/L (ref <18)
Troponin I (High Sensitivity): 9 ng/L (ref <18)

## 2022-06-03 MED ORDER — ENOXAPARIN SODIUM 40 MG/0.4ML IJ SOSY
40.0000 mg | PREFILLED_SYRINGE | Freq: Every day | INTRAMUSCULAR | Status: DC
  Administered 2022-06-04: 40 mg via SUBCUTANEOUS
  Filled 2022-06-03: qty 0.4

## 2022-06-03 MED ORDER — SODIUM CHLORIDE 0.9 % IV SOLN
500.0000 mg | INTRAVENOUS | Status: AC
  Administered 2022-06-04 – 2022-06-05 (×2): 500 mg via INTRAVENOUS
  Filled 2022-06-03 (×2): qty 5

## 2022-06-03 MED ORDER — ACETAMINOPHEN 650 MG RE SUPP: 650.0000 mg | Freq: Four times a day (QID) | RECTAL | Status: DC | PRN

## 2022-06-03 MED ORDER — SODIUM CHLORIDE 0.9 % IV SOLN
500.0000 mg | Freq: Once | INTRAVENOUS | Status: AC
  Administered 2022-06-03: 500 mg via INTRAVENOUS
  Filled 2022-06-03: qty 5

## 2022-06-03 MED ORDER — SODIUM CHLORIDE 0.9 % IV SOLN
1.0000 g | INTRAVENOUS | Status: AC
  Administered 2022-06-04 – 2022-06-07 (×4): 1 g via INTRAVENOUS
  Filled 2022-06-03 (×3): qty 10

## 2022-06-03 MED ORDER — HYDROXYZINE HCL 10 MG PO TABS: 10.0000 mg | ORAL_TABLET | Freq: Once | ORAL | Status: DC

## 2022-06-03 MED ORDER — NICOTINE 7 MG/24HR TD PT24: 7.0000 mg | MEDICATED_PATCH | Freq: Every day | TRANSDERMAL | Status: DC | PRN

## 2022-06-03 MED ORDER — SODIUM CHLORIDE 0.9 % IV SOLN
1.0000 g | Freq: Once | INTRAVENOUS | Status: AC
  Administered 2022-06-03: 1 g via INTRAVENOUS
  Filled 2022-06-03: qty 10

## 2022-06-03 MED ORDER — ACETAMINOPHEN 325 MG PO TABS
650.0000 mg | ORAL_TABLET | Freq: Four times a day (QID) | ORAL | Status: DC | PRN
  Administered 2022-06-04 – 2022-06-05 (×2): 650 mg via ORAL
  Filled 2022-06-03 (×2): qty 2

## 2022-06-03 MED ORDER — IPRATROPIUM-ALBUTEROL 0.5-2.5 (3) MG/3ML IN SOLN
3.0000 mL | RESPIRATORY_TRACT | Status: DC
  Administered 2022-06-04 – 2022-06-05 (×8): 3 mL via RESPIRATORY_TRACT
  Filled 2022-06-03 (×9): qty 3

## 2022-06-03 MED ORDER — ONDANSETRON HCL 4 MG/2ML IJ SOLN
4.0000 mg | Freq: Once | INTRAMUSCULAR | Status: AC
  Administered 2022-06-03: 4 mg via INTRAVENOUS
  Filled 2022-06-03: qty 2

## 2022-06-03 MED ORDER — LACTATED RINGERS IV BOLUS
1000.0000 mL | Freq: Once | INTRAVENOUS | Status: AC
  Administered 2022-06-03: 1000 mL via INTRAVENOUS

## 2022-06-03 MED ORDER — MAGNESIUM SULFATE 2 GM/50ML IV SOLN
2.0000 g | Freq: Once | INTRAVENOUS | Status: AC
  Administered 2022-06-03: 2 g via INTRAVENOUS
  Filled 2022-06-03: qty 50

## 2022-06-03 NOTE — H&P (Incomplete)
Hospital Admission History and Physical Service Pager: [phone]  Patient name: [name] [name] Medical record number: [id number] Date of Birth: 13-Aug-1955 Age: 67 y.o. Gender: female  Primary Care Provider: [place] Consultants: None Code Status: FULL Preferred Emergency Contact: [name], daughter Contact Information     Name Relation Home Work Mobile   [place] Daughter [phone]  [phone]   [name] Daughter   [phone]        Chief Complaint: SOB  Assessment and Plan: Jeralyn Nolden is a 67 y.o. female presenting with 3 day hx of progressive SOB and chest pain. Differential for this patient's presentation of this includes COPD exacerbation, CAP, PE, and ACS. COPD exac is supported by pt's hx of COPD, recent sick contact, increased cough, increased O2 requirement. CAP is considered given CXR showing potential developing PNA and pt reports subjective fever, but less likely given lack of leukocytosis. PE is considered given pt's tachycardia and R LE swelling and tenderness, but less likely given that this R LE findings are chronic and stable (from hx of R LE cellulitis). ACS is considered given chest pain and dyspnea, but less likely given normal trop x2.    * Acute hypoxic respiratory failure ([place]) Plan to treat for COPD exacerbation and presumed CAP. Pt currently on Bipap (baseline 2L [employer]), suspect she can wean off soon.  - Admit to [facility] w/ attending Dr. Erin Hearing. - Bipap, wean as tolerated. (Baseline 2L [place]). - Duonebs q4h sch - cont CTX 1g IV daily (tentatively planning for 5 day course) - cont azithromycin '500mg'$  IV daily (plan for 3 day course) - Start prednisone '40mg'$  PO daily for 4 doses starting tomorrow. S/p solumedrol w/ EMS.  - Tylenol prn for pain - f/u VBG - RT consulted - Vital per unit - AM CBC, CMP, Mg  Pleuritic chest pain Pt reports central/left sided chest pain that is exacerbates by coughing episodes (in  setting of COPD exac). Mildly tender to palpation on exam. Reassuringly, ACS workup neg.  - Tylenol prn for pain - f/u AM EKG  Atrial tachycardia Pt presented tachy to 120-140, now downtrending s/p 2L bolus in ED but still elevated. EKG shows potential A flutter vs Atrial tach.  Suspect tachycardia secondary to current illness and respiratory status with likely anxiety component as well, EKG manually reviewed, less concern for atrial flutter based on morphology.  Pt has a hx of transient Afib RVR 2 yrs ago for Staph sepsis, but no afib or flutter noted since. If tachycardia persists, can consider DVT U/S. - f/u AM EKG - F/u TSH   Chronic Conditions - Plan to order home meds once med rec is complete.  FEN/GI: NPO while on Bipap. Can add carb modified diet once of bipap.  VTE Prophylaxis: Lovenox  Disposition: Progressive  History of Present Illness:  [name] [name] is a 67 y.o. female presenting with dyspnea.  Patient reports she started feeling ill 3 days ago with subjective fever, cough, and SOB. She reports associated fatigue and new left-sided chest pain. Chest pain is aggravated by coughing episodes and deep breathing. She tried her Combivent and albuterol inhalers without significant relief. She reports her granddaughter has also been ill with upper respiratory symptoms recently. She was at her pain management appointment earlier today, found to be in resp distress, got 1 neb tx, and EMS was called to bring her to ED.   En route with EMS, pt was tachypneic and hypoxic, so was placed on nonrebreather. Received  duoneb x2 and solumedrol. In ED, pt was placed on Bipap. CXR showed potential PNA, so pt was started on CTX and azithro. PT also noted to be tachycardic. Pt received 2L LR bolus. ACS workup negative.   Pertinent Past Medical History: Hx of transient afib during sepsis Bipolar 1 COPD T2DM HLD HTN Hypothyroid Tobacco use  Remainder reviewed in history tab.   Pertinent  Past Surgical History: Abm hysterectomy Appendectomy Hernia repair  Remainder reviewed in history tab.  Pertinent Social History: Tobacco use: Yes/No/Former - started smoking at age 41, quit 2 years ago. Currently vapes. Alcohol use: denies Other Substance use: denies Lives with self. Daughter  lives close by  Pertinent Family History: Mother: Multiple sclerosis Father: alcohol use disorder Brother: Depression  Remainder reviewed in history tab.   Important Outpatient Medications:  Valproic acid Remainder reviewed in medication history.   Objective: BP (!) 117/90   Pulse (!) 131   Temp 98.4 F (36.9 C) (Oral)   Resp (!) 32   Ht '5\' 5"'$  (1.651 m)   Wt 110.7 kg   SpO2 93%   BMI 40.60 kg/m  Exam: General: Alert, laying in bed on Bipap, obese habitus. NAD. Eyes: PERRLA ENTM: NCAT Cardiovascular: Rapid rate, difficult to discern rhythm due to tachycardia. No murmurs noted.  Respiratory: Mild end-expiratory wheezing in LULF and RMLF. Speaking in full sentences on Bipap. Central chest pain worse with deep breaths and palpation.  Gastrointestinal: Soft, nontender, nondistended. Normal BS. Ext: R LE larger in size compared to L LE. Sore to palpation on R LE. Nonerythematous. Trace edema BL. Neuro: Alert, grossly oriented. Responding appropriately. Psych: Mood and affect congruent  Labs:  CBC BMET  Recent Labs  Lab 06/03/22 1925  WBC 5.9  HGB 10.4*  HCT 33.7*  PLT 163   Recent Labs  Lab 06/03/22 1925  NA 138  K 3.6  CL 96*  CO2 31  BUN 12  CREATININE 0.64  GLUCOSE 176*  CALCIUM 9.3    Trop wnl x2 RPP neg   EKG: My own interpretation (not copied from electronic read) Tachycardic rhythm with narrow QRS. Difficult to discern p waves due to rapid rate. Potentially A flutter (EKG read) or Atrial tachy.    Imaging Studies Performed:  Imaging Study (ie. Chest x-ray) Impression from Radiologist: Patchy opacity obscures the right heart border, new, cannot  exclude pneumonia. Recommend follow-up PA and lateral chest radiographs to resolution.     My Interpretation: opacity in R lower lung field, along R cardiac border. Could represent developing PNA.    Arlyce Dice, MD 06/04/2022, 12:20 AM PGY-1, [place] Intern pager: [phone], text pages welcome Secure chat group [facility] Teaching Service     Upper Level Addendum:  I have reviewed the above note, making necessary revisions as appropriate.  I agree with the medical decision making and physical exam by the resident as noted above.  Zola Button, MD PGY-3 [facility] Family Medicine Residency

## 2022-06-03 NOTE — ED Triage Notes (Signed)
BIBA from [facility], increased SOB X3 days. Prior h/o COPD and wears 2LNC at baseline. Given 1 neb tx at [facility] and 2 more duonebs en route with EMS and 125 solumedrol. Satting 88% on her normal 2LNC at [facility].   EMS VS: HR 140s  88% 2LNC  150/90  CBG = 155  20 G L hand

## 2022-06-03 NOTE — ED Provider Notes (Signed)
[facility] EMERGENCY DEPARTMENT Provider Note   CSN: [id number] Arrival date & time: 06/03/22  1918     History Chief Complaint  Patient presents with   Shortness of Breath    HPI [name] [name] is a 67 y.o. female presenting for fever cough congestion and developing shortness of breath.  She is a 67 year old female with extensive medical history including COPD.Timing of this she endorses 3 days of worsening sputum production.  She also endorses a history of hypertension, staph infections, sepsis in the past. She would want CPR or Intubation if indicated at this time.   Patient's recorded medical, surgical, social, medication list and allergies were reviewed in the Snapshot window as part of the initial history.   Review of Systems   Review of Systems  Physical Exam Updated Vital Signs BP 117/72   Pulse (!) 131   Temp 98.4 F (36.9 C) (Oral)   Resp (!) 32   Ht '5\' 5"'$  (1.651 m)   Wt 110.7 kg   SpO2 97%   BMI 40.60 kg/m  Physical Exam   ED Course/ Medical Decision Making/ A&P Clinical Course as of 06/03/22 2322  Fri Jun 03, 2022  2040 Finish labs and admit [CC]    Clinical Course User Index [CC] Tretha Sciara, MD    Procedures .Critical Care  Performed by: Tretha Sciara, MD Authorized by: Tretha Sciara, MD   Critical care provider statement:    Critical care time (minutes):  30   Critical care was necessary to treat or prevent imminent or life-threatening deterioration of the following conditions:  Respiratory failure   Critical care was time spent personally by me on the following activities:  Development of treatment plan with patient or surrogate, discussions with consultants, evaluation of patient's response to treatment, examination of patient, ordering and review of laboratory studies, ordering and review of radiographic studies, ordering and performing treatments and interventions, pulse oximetry, re-evaluation of patient's  condition and review of old charts    Medications Ordered in ED Medications  magnesium sulfate IVPB 2 g 50 mL (0 g Intravenous Stopped 06/03/22 2124)  lactated ringers bolus 1,000 mL (0 mLs Intravenous Stopped 06/03/22 2124)  cefTRIAXone (ROCEPHIN) 1 g in sodium chloride 0.9 % 100 mL IVPB (0 g Intravenous Stopped 06/03/22 2124)  azithromycin (ZITHROMAX) 500 mg in sodium chloride 0.9 % 250 mL IVPB (0 mg Intravenous Stopped 06/03/22 2124)  ondansetron (ZOFRAN) injection 4 mg (4 mg Intravenous Given 06/03/22 2144)  lactated ringers bolus 1,000 mL (1,000 mLs Intravenous New Bag/Given 06/03/22 2149)   Medical Decision Making:   [name] [name] is a 67 y.o. female with a history of COPD , who presented to the ED today with acute on chronic SOB. They are endorsing worsening of their baseline dyspnea over the past 48 hours. Their baseline is a 2L O2 requirement. At their baseline they are able to get around the neighborhood and they are not able to at this time.   On my initial exam, the pt was SOB and tachypneic. Audible wheezing and grossly decreased breath sounds appreciated.  They are endorsing increased sputum production.    Reviewed and confirmed nursing documentation for past medical history, family history, social history.    Initial Assessment:   With the patient's presentation of SOB in the above setting, most likely diagnosis is COPD Exacerbation. Other diagnoses were considered including (but not limited to) CAP, PE, ACS, viral infection, PTX. These are considered less likely due to history of present  illness and physical exam findings.   This is most consistent with an acute life/limb threatening illness complicated by underlying chronic conditions.  Initial Plan:  Empiric treatment of patient's symptoms with immediate initiation of inhaled bronchodilators and IV steroids. Given advanced nature of patient's presentation, will proceed with IV magnesium as a rescue therapy.   Given extremis  nature of the presentation, patient required non invasive ventilation initiation. This ultimately failed and patient with worsening extremis required intubation as above  Evaluation for ACS with EKG and delta troponin  Evaluation for infectious versus intrathoracic abnormality with chest x-ray  Evaluation for volume overload with BNP  Screening labs including CBC and Metabolic panel to evaluate for infectious or metabolic etiology of disease.  Patient's Wells score is low and patient does not warrant further objective evaluation for PE based on consistency of presentation of alternative diagnosis.  Objective evaluation as below reviewed   Initial Study Results:   Laboratory  All laboratory results reviewed without evidence of clinically relevant pathology.    EKG EKG was reviewed independently. Rate, rhythm, axis, intervals all examined and without medically relevant abnormality. ST segments without concerns for elevations.    Radiology:  All images reviewed independently. Agree with radiology report at this time.   DG Chest Portable 1 View  Result Date: 06/03/2022 CLINICAL DATA:  Dyspnea, COPD exacerbation EXAM: PORTABLE CHEST 1 VIEW COMPARISON:  10/23/2020 chest radiograph. FINDINGS: Stable cardiomediastinal silhouette with borderline mild cardiomegaly. No pneumothorax. No pleural effusion. Patchy opacity obscures the right heart border, new. Healed left mid rib deformity. No overt pulmonary edema. IMPRESSION: Patchy opacity obscures the right heart border, new, cannot exclude pneumonia. Recommend follow-up PA and lateral chest radiographs to resolution. Electronically Signed   By: Ilona Sorrel M.D.   On: 06/03/2022 19:44    Final Assessment and Plan:   After initiation of medical therapies, patient is grossly improved and no longer in acute distress.    Given the advanced nature of the patient's prenetation, patient will require advanced care and admission was arranged.      Clinical  Impression:  1. COPD exacerbation ([place])      Data Unavailable   Final Clinical Impression(s) / ED Diagnoses Final diagnoses:  COPD exacerbation ([place])    Rx / DC Orders ED Discharge Orders     None         Tretha Sciara, MD 06/03/22 2322

## 2022-06-03 NOTE — H&P (Incomplete)
Hospital Admission History and Physical Service Pager: [phone]  Patient name: [name] [name] Medical record number: [id number] Date of Birth: 1956/01/28 Age: 67 y.o. Gender: female  Primary Care Provider: [place] Consultants: None Code Status: FULL Preferred Emergency Contact: [name] [name], daughter Contact Information     Name Relation Home Work Mobile   [place] Daughter [phone]  [phone]   [name] [name] Daughter   [phone]        Chief Complaint: SOB  Assessment and Plan: [name] [name] is a 67 y.o. female presenting with 3 day hx of progressive SOB and chest pain. Differential for this patient's presentation of this includes COPD exacerbation, CAP, PE, and ACS. COPD exac is supported by pt's hx of COPD, recent sick contact, increased cough, increased O2 requirement. CAP is considered given CXR showing potential developing PNA and pt reports subjective fever, but less likely given lack of leukocytosis. PE is considered given pt's tachycardia and R LE swelling and tenderness, but less likely given that this R LE findings are chronic and stable. ACS is considered given chest pain and dyspnea, but less likely given normal trop x2.    No notes have been filed under this hospital service. Service: Family Medicine      FEN/GI: NPO while on Bipap. Can add carb modified diet once of bipap.  VTE Prophylaxis: Lovenox  Disposition: Progressive  History of Present Illness:  [name] [name] is a 67 y.o. female presenting with dyspnea.  Patient reports she started feeling ill 3 days ago with subjective fever, cough, and SOB. She reports associated fatigue and new left-sided chest pain. Chest pain is aggravated by coughing episodes and deep breathing. She tried her Combivent and albuterol inhalers without significant relief. She reports her granddaughter has also been ill with upper respiratory symptoms recently. She was at her pain management  appointment earlier today, found to be in resp distress, got 1 neb tx, and EMS was called to bring her to ED.   En route with EMS, pt was tachypneic and hypoxic, so was placed on nonrebreather. Received duoneb x2 and solumedrol. In ED, pt was placed on Bipap. CXR showed potential PNA, so pt was started on CTX and azithro. PT also noted to be tachycardic. Pt received 2L LR bolus. ACS workup negative.   Pertinent Past Medical History: Hx of transient afib during sepsis Bipolar 1 COPD T2DM HLD HTN Hypothyroid Tobacco use  Remainder reviewed in history tab.   Pertinent Past Surgical History: Abm hysterectomy Appendectomy Hernia repair  Remainder reviewed in history tab.  Pertinent Social History: Tobacco use: Yes/No/Former - started smoking at age 85, quit 2 years ago. Currently vapes. Alcohol use: denies Other Substance use: denies Lives with self. Daughter  lives close by  Pertinent Family History: Mother: Multiple sclerosis Father: alcohol use disorder Brother: Depression  Remainder reviewed in history tab.   Important Outpatient Medications:  Valproic acid Remainder reviewed in medication history.   Objective: BP 117/72   Pulse (!) 115   Temp 98.4 F (36.9 C) (Oral)   Resp 16   Ht '5\' 5"'$  (1.651 m)   Wt 110.7 kg   SpO2 92%   BMI 40.60 kg/m  Exam: General: Alert, laying in bed on Bipap, obese habitus. NAD. Eyes: PERRLA ENTM: NCAT Cardiovascular: Rapid rate, difficult to discern rhythm due to tachycardia. No murmurs noted.  Respiratory: Mild end-expiratory wheezing in LULF and RMLF. Speaking in full sentences on Bipap. Central chest pain worse with deep breaths.  Gastrointestinal: Soft,  nontender, nondistended. Normal BS. Ext: R LE larger in size compared to L LE. Sore to palpation on R LE. Nonerythematous. Trace edema BL. Neuro: Alert, grossly oriented. Responding appropriately. Psych: Mood and affect congruent  Labs:  CBC BMET  Recent Labs  Lab  06/03/22 1925  WBC 5.9  HGB 10.4*  HCT 33.7*  PLT 163   Recent Labs  Lab 06/03/22 1925  NA 138  K 3.6  CL 96*  CO2 31  BUN 12  CREATININE 0.64  GLUCOSE 176*  CALCIUM 9.3    Trop wnl x2 RPP neg   EKG: My own interpretation (not copied from electronic read) Tachycardic rhythm with narrow QRS. Difficult to discern p waves due to rapid rate. Potentially A flutter (EKG read) or Atrial tachy.    Imaging Studies Performed:  Imaging Study (ie. Chest x-ray) Impression from Radiologist: Patchy opacity obscures the right heart border, new, cannot exclude pneumonia. Recommend follow-up PA and lateral chest radiographs to resolution.     My Interpretation: opacity in R lower lung field, along R cardiac border. Could represent developing PNA.    Arlyce Dice, MD 06/03/2022, 10:47 PM PGY-1, [place] Intern pager: [phone], text pages welcome Secure chat group [place]

## 2022-06-03 NOTE — Discharge Instructions (Addendum)
Thank you for letting us care for you during your stay!  You were admitted to the [facility] Medicine Teaching Service.   You were admitted for COPD exacerbation and pneumonia and we found that you had an abnormal heart rhythm (atrial flutter) while you were here.  Important changes and additions to your medicines include: Cardizem (also known as diltiazem) 360 mg daily for your heart rhythm Eliquis (also known as apixiban) 5 mg TWICE daily to thin your blood and prevent a clot because of your heart rhythm _____ instead of your Spuriva inhaler because this is a stronger, more effective inhaler  We recommend follow up specifically for: Your lung disease with pulmonology, your PCP can set that up Cardiology for your atrial flutter heart rhythm  Please follow up with your primary care physician in 1 week.   If your symptoms worsen or return, please return to the hospital.  Please let us know if you have questions about your stay at [facility].

## 2022-06-04 ENCOUNTER — Encounter (HOSPITAL_COMMUNITY): Payer: Self-pay | Admitting: Family Medicine

## 2022-06-04 ENCOUNTER — Observation Stay (HOSPITAL_COMMUNITY): Payer: Medicare Other

## 2022-06-04 DIAGNOSIS — I483 Typical atrial flutter: Secondary | ICD-10-CM | POA: Diagnosis present

## 2022-06-04 DIAGNOSIS — Z6841 Body Mass Index (BMI) 40.0 and over, adult: Secondary | ICD-10-CM | POA: Diagnosis not present

## 2022-06-04 DIAGNOSIS — J9621 Acute and chronic respiratory failure with hypoxia: Secondary | ICD-10-CM | POA: Diagnosis present

## 2022-06-04 DIAGNOSIS — F17200 Nicotine dependence, unspecified, uncomplicated: Secondary | ICD-10-CM | POA: Diagnosis not present

## 2022-06-04 DIAGNOSIS — J449 Chronic obstructive pulmonary disease, unspecified: Secondary | ICD-10-CM | POA: Diagnosis not present

## 2022-06-04 DIAGNOSIS — Z9981 Dependence on supplemental oxygen: Secondary | ICD-10-CM | POA: Diagnosis not present

## 2022-06-04 DIAGNOSIS — I480 Paroxysmal atrial fibrillation: Secondary | ICD-10-CM | POA: Diagnosis present

## 2022-06-04 DIAGNOSIS — R339 Retention of urine, unspecified: Secondary | ICD-10-CM | POA: Diagnosis present

## 2022-06-04 DIAGNOSIS — I4891 Unspecified atrial fibrillation: Secondary | ICD-10-CM | POA: Diagnosis not present

## 2022-06-04 DIAGNOSIS — I110 Hypertensive heart disease with heart failure: Secondary | ICD-10-CM | POA: Diagnosis present

## 2022-06-04 DIAGNOSIS — I340 Nonrheumatic mitral (valve) insufficiency: Secondary | ICD-10-CM | POA: Diagnosis not present

## 2022-06-04 DIAGNOSIS — Q2112 Patent foramen ovale: Secondary | ICD-10-CM | POA: Diagnosis not present

## 2022-06-04 DIAGNOSIS — I4819 Other persistent atrial fibrillation: Secondary | ICD-10-CM | POA: Diagnosis not present

## 2022-06-04 DIAGNOSIS — R0781 Pleurodynia: Secondary | ICD-10-CM | POA: Insufficient documentation

## 2022-06-04 DIAGNOSIS — D638 Anemia in other chronic diseases classified elsewhere: Secondary | ICD-10-CM | POA: Diagnosis not present

## 2022-06-04 DIAGNOSIS — I4892 Unspecified atrial flutter: Secondary | ICD-10-CM | POA: Diagnosis not present

## 2022-06-04 DIAGNOSIS — I3481 Nonrheumatic mitral (valve) annulus calcification: Secondary | ICD-10-CM | POA: Diagnosis present

## 2022-06-04 DIAGNOSIS — I5020 Unspecified systolic (congestive) heart failure: Secondary | ICD-10-CM | POA: Diagnosis not present

## 2022-06-04 DIAGNOSIS — Z79899 Other long term (current) drug therapy: Secondary | ICD-10-CM | POA: Diagnosis not present

## 2022-06-04 DIAGNOSIS — E119 Type 2 diabetes mellitus without complications: Secondary | ICD-10-CM | POA: Diagnosis present

## 2022-06-04 DIAGNOSIS — F319 Bipolar disorder, unspecified: Secondary | ICD-10-CM | POA: Diagnosis present

## 2022-06-04 DIAGNOSIS — J440 Chronic obstructive pulmonary disease with acute lower respiratory infection: Secondary | ICD-10-CM | POA: Diagnosis present

## 2022-06-04 DIAGNOSIS — J441 Chronic obstructive pulmonary disease with (acute) exacerbation: Secondary | ICD-10-CM | POA: Diagnosis present

## 2022-06-04 DIAGNOSIS — Z1152 Encounter for screening for COVID-19: Secondary | ICD-10-CM | POA: Diagnosis not present

## 2022-06-04 DIAGNOSIS — E785 Hyperlipidemia, unspecified: Secondary | ICD-10-CM | POA: Diagnosis present

## 2022-06-04 DIAGNOSIS — G8929 Other chronic pain: Secondary | ICD-10-CM | POA: Diagnosis present

## 2022-06-04 DIAGNOSIS — I5021 Acute systolic (congestive) heart failure: Secondary | ICD-10-CM | POA: Diagnosis not present

## 2022-06-04 DIAGNOSIS — I4719 Other supraventricular tachycardia: Secondary | ICD-10-CM

## 2022-06-04 DIAGNOSIS — E039 Hypothyroidism, unspecified: Secondary | ICD-10-CM | POA: Diagnosis present

## 2022-06-04 DIAGNOSIS — I5022 Chronic systolic (congestive) heart failure: Secondary | ICD-10-CM | POA: Diagnosis not present

## 2022-06-04 DIAGNOSIS — I10 Essential (primary) hypertension: Secondary | ICD-10-CM | POA: Diagnosis not present

## 2022-06-04 DIAGNOSIS — J159 Unspecified bacterial pneumonia: Secondary | ICD-10-CM | POA: Diagnosis present

## 2022-06-04 DIAGNOSIS — I350 Nonrheumatic aortic (valve) stenosis: Secondary | ICD-10-CM | POA: Diagnosis not present

## 2022-06-04 DIAGNOSIS — I482 Chronic atrial fibrillation, unspecified: Secondary | ICD-10-CM | POA: Diagnosis not present

## 2022-06-04 DIAGNOSIS — M169 Osteoarthritis of hip, unspecified: Secondary | ICD-10-CM | POA: Diagnosis present

## 2022-06-04 DIAGNOSIS — Z23 Encounter for immunization: Secondary | ICD-10-CM | POA: Diagnosis present

## 2022-06-04 DIAGNOSIS — J9601 Acute respiratory failure with hypoxia: Secondary | ICD-10-CM | POA: Diagnosis not present

## 2022-06-04 DIAGNOSIS — Z6372 Alcoholism and drug addiction in family: Secondary | ICD-10-CM | POA: Diagnosis not present

## 2022-06-04 DIAGNOSIS — M179 Osteoarthritis of knee, unspecified: Secondary | ICD-10-CM | POA: Diagnosis present

## 2022-06-04 DIAGNOSIS — F419 Anxiety disorder, unspecified: Secondary | ICD-10-CM | POA: Diagnosis present

## 2022-06-04 HISTORY — DX: Retention of urine, unspecified: R33.9

## 2022-06-04 HISTORY — DX: Unspecified atrial flutter: I48.92

## 2022-06-04 LAB — I-STAT VENOUS BLOOD GAS, ED
Acid-Base Excess: 10 mmol/L — ABNORMAL HIGH (ref 0.0–2.0)
Bicarbonate: 37.6 mmol/L — ABNORMAL HIGH (ref 20.0–28.0)
Calcium, Ion: 1.2 mmol/L (ref 1.15–1.40)
HCT: 31 % — ABNORMAL LOW (ref 36.0–46.0)
Hemoglobin: 10.5 g/dL — ABNORMAL LOW (ref 12.0–15.0)
O2 Saturation: 94 %
Potassium: 4.5 mmol/L (ref 3.5–5.1)
Sodium: 139 mmol/L (ref 135–145)
TCO2: 40 mmol/L — ABNORMAL HIGH (ref 22–32)
pCO2, Ven: 66.8 mmHg — ABNORMAL HIGH (ref 44–60)
pH, Ven: 7.358 (ref 7.25–7.43)
pO2, Ven: 76 mmHg — ABNORMAL HIGH (ref 32–45)

## 2022-06-04 LAB — COMPREHENSIVE METABOLIC PANEL
ALT: 12 U/L (ref 0–44)
AST: 14 U/L — ABNORMAL LOW (ref 15–41)
Albumin: 3.4 g/dL — ABNORMAL LOW (ref 3.5–5.0)
Alkaline Phosphatase: 65 U/L (ref 38–126)
Anion gap: 9 (ref 5–15)
BUN: 13 mg/dL (ref 8–23)
CO2: 34 mmol/L — ABNORMAL HIGH (ref 22–32)
Calcium: 8.8 mg/dL — ABNORMAL LOW (ref 8.9–10.3)
Chloride: 94 mmol/L — ABNORMAL LOW (ref 98–111)
Creatinine, Ser: 0.65 mg/dL (ref 0.44–1.00)
GFR, Estimated: >60 mL/min (ref >60)
Glucose, Bld: 215 mg/dL — ABNORMAL HIGH (ref 70–99)
Potassium: 4.4 mmol/L (ref 3.5–5.1)
Sodium: 137 mmol/L (ref 135–145)
Total Bilirubin: 0.4 mg/dL (ref 0.3–1.2)
Total Protein: 6.6 g/dL (ref 6.5–8.1)

## 2022-06-04 LAB — CBC
HCT: 32.4 % — ABNORMAL LOW (ref 36.0–46.0)
Hemoglobin: 10 g/dL — ABNORMAL LOW (ref 12.0–15.0)
MCH: 27.2 pg (ref 26.0–34.0)
MCHC: 30.9 g/dL (ref 30.0–36.0)
MCV: 88 fL (ref 80.0–100.0)
Platelets: 147 10*3/uL — ABNORMAL LOW (ref 150–400)
RBC: 3.68 MIL/uL — ABNORMAL LOW (ref 3.87–5.11)
RDW: 14.9 % (ref 11.5–15.5)
WBC: 5.1 10*3/uL (ref 4.0–10.5)
nRBC: 0 % (ref 0.0–0.2)

## 2022-06-04 LAB — CBG MONITORING, ED
Glucose-Capillary: 148 mg/dL — ABNORMAL HIGH (ref 70–99)
Glucose-Capillary: 176 mg/dL — ABNORMAL HIGH (ref 70–99)

## 2022-06-04 LAB — TROPONIN I (HIGH SENSITIVITY)
Troponin I (High Sensitivity): 9 ng/L (ref <18)
Troponin I (High Sensitivity): 10 ng/L (ref <18)

## 2022-06-04 LAB — BRAIN NATRIURETIC PEPTIDE: B Natriuretic Peptide: 435.8 pg/mL — ABNORMAL HIGH (ref 0.0–100.0)

## 2022-06-04 LAB — TSH: TSH: 0.873 u[IU]/mL (ref 0.350–4.500)

## 2022-06-04 LAB — MAGNESIUM: Magnesium: 2 mg/dL (ref 1.7–2.4)

## 2022-06-04 LAB — HIV ANTIBODY (ROUTINE TESTING W REFLEX): HIV Screen 4th Generation wRfx: NONREACTIVE

## 2022-06-04 LAB — HEMOGLOBIN A1C
Hgb A1c MFr Bld: 6.7 % — ABNORMAL HIGH (ref 4.8–5.6)
Mean Plasma Glucose: 145.59 mg/dL

## 2022-06-04 MED ORDER — ALBUTEROL SULFATE (2.5 MG/3ML) 0.083% IN NEBU: 2.5000 mg | INHALATION_SOLUTION | RESPIRATORY_TRACT | Status: DC | PRN

## 2022-06-04 MED ORDER — INSULIN ASPART 100 UNIT/ML IJ SOLN
0.0000 [IU] | Freq: Three times a day (TID) | INTRAMUSCULAR | Status: DC
  Administered 2022-06-04: 4 [IU] via SUBCUTANEOUS
  Administered 2022-06-04 – 2022-06-05 (×2): 3 [IU] via SUBCUTANEOUS
  Administered 2022-06-05 – 2022-06-06 (×3): 4 [IU] via SUBCUTANEOUS
  Administered 2022-06-06: 11 [IU] via SUBCUTANEOUS
  Administered 2022-06-06 – 2022-06-07 (×2): 3 [IU] via SUBCUTANEOUS
  Administered 2022-06-07: 4 [IU] via SUBCUTANEOUS
  Administered 2022-06-07: 7 [IU] via SUBCUTANEOUS
  Administered 2022-06-08: 4 [IU] via SUBCUTANEOUS
  Administered 2022-06-08: 11 [IU] via SUBCUTANEOUS
  Administered 2022-06-08 – 2022-06-09 (×2): 3 [IU] via SUBCUTANEOUS
  Administered 2022-06-09 – 2022-06-10 (×3): 4 [IU] via SUBCUTANEOUS
  Administered 2022-06-10 – 2022-06-11 (×2): 3 [IU] via SUBCUTANEOUS

## 2022-06-04 MED ORDER — SERTRALINE HCL 100 MG PO TABS
200.0000 mg | ORAL_TABLET | Freq: Every day | ORAL | Status: DC
  Administered 2022-06-04 – 2022-06-11 (×8): 200 mg via ORAL
  Filled 2022-06-04 (×8): qty 2

## 2022-06-04 MED ORDER — KETOROLAC TROMETHAMINE 15 MG/ML IJ SOLN
15.0000 mg | Freq: Once | INTRAMUSCULAR | Status: AC
  Administered 2022-06-04: 15 mg via INTRAVENOUS
  Filled 2022-06-04: qty 1

## 2022-06-04 MED ORDER — UMECLIDINIUM BROMIDE 62.5 MCG/ACT IN AEPB
1.0000 | INHALATION_SPRAY | Freq: Every day | RESPIRATORY_TRACT | Status: DC
  Administered 2022-06-04 – 2022-06-10 (×7): 1 via RESPIRATORY_TRACT
  Filled 2022-06-04 (×2): qty 7

## 2022-06-04 MED ORDER — AMLODIPINE BESYLATE 5 MG PO TABS
5.0000 mg | ORAL_TABLET | Freq: Every day | ORAL | Status: DC
  Administered 2022-06-04: 5 mg via ORAL
  Filled 2022-06-04: qty 1

## 2022-06-04 MED ORDER — BUSPIRONE HCL 5 MG PO TABS
10.0000 mg | ORAL_TABLET | Freq: Three times a day (TID) | ORAL | Status: DC
  Administered 2022-06-04 – 2022-06-11 (×22): 10 mg via ORAL
  Filled 2022-06-04 (×6): qty 2
  Filled 2022-06-04: qty 1
  Filled 2022-06-04 (×10): qty 2
  Filled 2022-06-04: qty 1
  Filled 2022-06-04 (×4): qty 2

## 2022-06-04 MED ORDER — MAGNESIUM OXIDE -MG SUPPLEMENT 400 (240 MG) MG PO TABS
400.0000 mg | ORAL_TABLET | Freq: Every day | ORAL | Status: DC
  Administered 2022-06-04 – 2022-06-11 (×8): 400 mg via ORAL
  Filled 2022-06-04 (×8): qty 1

## 2022-06-04 MED ORDER — DIVALPROEX SODIUM 500 MG PO DR TAB
500.0000 mg | DELAYED_RELEASE_TABLET | Freq: Two times a day (BID) | ORAL | Status: DC
  Administered 2022-06-04 – 2022-06-11 (×15): 500 mg via ORAL
  Filled 2022-06-04 (×8): qty 1
  Filled 2022-06-04: qty 2
  Filled 2022-06-04 (×8): qty 1

## 2022-06-04 MED ORDER — BENZTROPINE MESYLATE 0.5 MG PO TABS
0.5000 mg | ORAL_TABLET | Freq: Two times a day (BID) | ORAL | Status: DC
  Administered 2022-06-04 – 2022-06-11 (×15): 0.5 mg via ORAL
  Filled 2022-06-04 (×17): qty 1

## 2022-06-04 MED ORDER — PREDNISONE 20 MG PO TABS
40.0000 mg | ORAL_TABLET | Freq: Every day | ORAL | Status: AC
  Administered 2022-06-04 – 2022-06-07 (×4): 40 mg via ORAL
  Filled 2022-06-04 (×4): qty 2

## 2022-06-04 MED ORDER — QUETIAPINE FUMARATE 25 MG PO TABS
25.0000 mg | ORAL_TABLET | Freq: Three times a day (TID) | ORAL | Status: DC
  Administered 2022-06-04 (×3): 25 mg via ORAL
  Administered 2022-06-05: 50 mg via ORAL
  Filled 2022-06-04: qty 2
  Filled 2022-06-04 (×3): qty 1

## 2022-06-04 MED ORDER — ATORVASTATIN CALCIUM 10 MG PO TABS
20.0000 mg | ORAL_TABLET | Freq: Every day | ORAL | Status: DC
  Administered 2022-06-04 – 2022-06-10 (×7): 20 mg via ORAL
  Filled 2022-06-04 (×7): qty 2

## 2022-06-04 MED ORDER — ALBUTEROL SULFATE (2.5 MG/3ML) 0.083% IN NEBU
INHALATION_SOLUTION | RESPIRATORY_TRACT | Status: AC
  Administered 2022-06-04: 2.5 mg
  Filled 2022-06-04: qty 3

## 2022-06-04 MED ORDER — DILTIAZEM HCL 60 MG PO TABS
30.0000 mg | ORAL_TABLET | Freq: Once | ORAL | Status: AC
  Administered 2022-06-04: 30 mg via ORAL
  Filled 2022-06-04: qty 1

## 2022-06-04 NOTE — Progress Notes (Signed)
Pt taken off bipap and placed on 4L [place] by RT. Pt tolerating well at this time, RN ware, No increased WOB noted, vitals stable, Spo2 97%, RT will monitor.     06/04/22 1001  Therapy Vitals  Pulse Rate (!) 116  Resp (!) 22  MEWS Score/Color  MEWS Score 3  MEWS Score Color Yellow  Respiratory Assessment  Assessment Type Assess only  Respiratory Pattern Regular;Unlabored  Chest Assessment Chest expansion symmetrical  Cough None  Bilateral Breath Sounds Clear;Diminished  Oxygen Therapy/Pulse Ox  O2 Device (S)  Nasal Cannula  O2 Therapy Oxygen  O2 Flow Rate (L/min) 4 L/min  SpO2 97 %

## 2022-06-04 NOTE — Progress Notes (Signed)
   06/04/22 1952  Assess: MEWS Score  Temp 98.3 F (36.8 C)  BP (!) 133/99  MAP (mmHg) 110  Pulse Rate (!) 135  ECG Heart Rate (!) 137  Resp (!) 22  SpO2 93 %  O2 Device Nasal Cannula  Assess: MEWS Score  MEWS Temp 0  MEWS Systolic 0  MEWS Pulse 3  MEWS RR 1  MEWS LOC 0  MEWS Score 4  MEWS Score Color Red  Assess: if the MEWS score is Yellow or Red  Were vital signs taken at a resting state? Yes  Focused Assessment Change from prior assessment (see assessment flowsheet)  Does the patient meet 2 or more of the SIRS criteria? No  MEWS guidelines implemented *See Row Information* Yes  Treat  MEWS Interventions Administered scheduled meds/treatments;Escalated (See documentation below)  Pain Score 0  Take Vital Signs  Increase Vital Sign Frequency  Red: Q 1hr X 4 then Q 4hr X 4, if remains red, continue Q 4hrs  Escalate  MEWS: Escalate Red: discuss with charge nurse/RN and provider, consider discussing with RRT  Notify: Charge Nurse/RN  Name of Charge Nurse/RN Notified [place], RN  Date Charge Nurse/RN Notified 06/04/22  Time Charge Nurse/RN Notified 2000  Provider Notification  Provider Name/Title Jinny Sanders, MD  Date Provider Notified 06/04/22  Time Provider Notified 1955  Method of Notification Call  Notification Reason Change in status  Provider response See new orders  Date of Provider Response 06/04/22  Time of Provider Response 2000  Document  Patient Outcome Stabilized after interventions  Progress note created (see row info) Yes  Assess: SIRS CRITERIA  SIRS Temperature  0  SIRS Pulse 1  SIRS Respirations  1  SIRS WBC 0  SIRS Score Sum  2

## 2022-06-04 NOTE — Assessment & Plan Note (Addendum)
Continuing to spontaneously void.  No concern for retention at this time. - Strict I/O, continue to monitor - Bladder scan if not urinating, I&O >200 mL, if I&O x 3 start foley catheter

## 2022-06-04 NOTE — Progress Notes (Signed)
Spoke with on-call cardiology fellow Dr. Patsey Berthold that patient's EKG reading A-fib RVR.  Patient complaining of some chest pressure when I was in the room that has been ongoing since she has been in the hospital.  Troponin x 1 at 9.  Has had a history of A-fib RVR in 2021 when she had staph infection.  Last echo in 2021 with normal EF.  On evaluation of EKG he believes possible diagnosis atrial flutter.  He recommended AV nodal blockade with Cardizem at 30 mg.  He also recommended repeat echocardiogram tomorrow morning.  Likely she will need anticoagulation.  He will likely round on patient tonight or have day team round tomorrow.  I will hold patient's amlodipine for now-pressures normotensive currently.

## 2022-06-04 NOTE — Progress Notes (Signed)
Spoke with on-call cardiology fellow Dr. Marcelle Smiling regarding patient's EKG.  Patient has had 2 EKGs obtained, machine read as atrial flutter however to me looks more like sinus tach.  He agreed and felt that EKG looks more like sinus tachycardia or atrial tachycardia which may resolve with clinical improvement.  No further treatment recommended at this time.  If flutter waves remain persistent there could be a possibility of atrial flutter but can reassess in the next few days.

## 2022-06-04 NOTE — Assessment & Plan Note (Deleted)
Pt reports central/left sided chest pain that is exacerbates by coughing episodes (in setting of COPD exac) on admission. No complaints of CP today.  Reassuringly, ACS workup neg.  - Tylenol prn for pain - f/u AM EKG

## 2022-06-04 NOTE — Progress Notes (Incomplete)
Pt taken off BiPAP and placed on 2L [place]. Pt tolerating well at this time,

## 2022-06-04 NOTE — ED Notes (Addendum)
Patient c.o of 10/10 suprapubic pain, patient states "she feels like she needs to pee", been provided with bedpan and purewick without success. patient has had 81m of urinary output during her stay in past 10hrs. Bedside bladder scan performed and showed 440cc in bladder. Intermittent cath performed for urinary retention, MD ZMarkus Jarvisis aware of this situation

## 2022-06-04 NOTE — Assessment & Plan Note (Addendum)
Stable - CBG check ACHS - mSSI - Carb modified diet

## 2022-06-04 NOTE — Assessment & Plan Note (Addendum)
S/p TEE/cardioversion on 1/19. Now stable in NSR. Likely stable for d/c today. -Appreciate cardiology recs - Cardizem 360 mg PO daily - Metoprolol 25 mg BID - Apixaban 5 mg twice daily - Lasix 40 mg PO daily for edema - Poor candidate for amiodarone d/t respiratory disease and current respiratory status - Repeat echo in 3 months to check EF on rate control

## 2022-06-04 NOTE — Assessment & Plan Note (Addendum)
Back to baseline on 3L [place] when ambulating with some mild work of breathing. - Xopenex (levabuterol) Q6H scheduled - Duonebs Q6H as needed - Tylenol prn for pain - Continue Incruse Ellipta daily (LAMA) and Breo Ellipta (ICS+LABA) ([place] on d/c) - s/p CTX IV (5 days), azithromycin IV (3 days), and prednisone '40mg'$  PO (4 days)

## 2022-06-04 NOTE — ED Notes (Signed)
MD Holley Bouche notified of pts HR 130s-140s.

## 2022-06-04 NOTE — Progress Notes (Signed)
Daily Progress Note Intern Pager: [phone]  Patient name: [name] [name] Medical record number: [id number] Date of birth: 1955/10/25 Age: 67 y.o. Gender: female  Primary Care Provider: [facility] [place] [facility] Consultants: None Code Status: Full  Pt Overview and Major Events to Date:  1/12 - Admitted  Assessment and Plan:  [name] [name] is a 67 y.o. female presenting with 3 day hx of chest pain and progressive SOB, found to be in COPD exacerbation with CAP. PMH significant for COPD on 2L, T2DM, bipolar 1 disorder, HTN    * Acute hypoxic respiratory failure ([place]) Plan to treat for COPD exacerbation and CAP. Pt currently on Bipap (baseline 2L [place], but recently told to increase to 4-5 L @ home, for dyspnea) w/ good O2 sats, suspect she can wean off soon.  - Bipap, wean as tolerated. (Baseline 4-5L [place]). - F/u Rpt CXR - Duonebs q4h sch - cont CTX 1g IV daily (2/5) - cont azithromycin '500mg'$  IV daily (2/3) - prednisone '40mg'$  PO daily for 4 doses starting today (1/4). S/p solumedrol w/ EMS.  - Tylenol prn for pain - RT consulted - AM CBC, BMP  Atrial tachycardia Pt presented tachy to 120-140, s/p 2L bolus in ED but still elevated. Evening EKG showed A. Flutter vs A. Tach, discussed w/ cardiology fellow overnight he believed it was 2/2 to illness and did not need intervention.  EKG the same this morning. Qtc 499. Pt has a hx of transient Afib RVR 2 yrs ago for Staph sepsis, but no afib or flutter noted since. Patient denies any CP. If tachycardia persists, will obtain rpt EKG and consult cards formally.  - EKG - F/u TSH - Consider Cards consult  Urinary retention Patient complaining of suprapubic pain/fullness overnight and was noted to have 400 cc on bladder scan. Patient was I/O cathed.  -Bladder scan q6h -Consider I/O cath for bladder scan over 200 cc -If I/O x 3, will start foley catheter  Non-insulin dependent type 2 diabetes mellitus ([place]) Patient w/ DM2, not on  insulin. A1c 6.7 06/03/22. Chart review shows patient on metformin 500 mg daily in [facility] note, but not noted in home meds. CBG range 176-215. Patient was NPO but now has a diet, and is on steroids.  -CBG check AC & QHS -mSSI -Carb modified diet  Pleuritic chest pain Pt reports central/left sided chest pain that is exacerbates by coughing episodes (in setting of COPD exac) on admission. No complaints of CP today.  Reassuringly, ACS workup neg.  - Tylenol prn for pain - f/u AM EKG   Chronic Conditions Bipolar - zoloft, quetiapine, depakote, buspar,  HTN - amlodipine, consider restart lisinopril/HCTZ if BP remains elevated   FEN/GI: NPO while on BiPap PPx: Lopvenox Dispo:Home pending clinical improvement . Barriers include respiratory status.   Subjective:  Report's she is doing well today. Breathing is improved, no chest pain, but does not pain in knee and hip from her OA.   Objective: Temp:  [97.6 F (36.4 C)-98.4 F (36.9 C)] 98 F (36.7 C) (01/13 0955) Pulse Rate:  [102-153] 119 (01/13 1220) Resp:  [14-33] 24 (01/13 1220) BP: (112-175)/(36-145) 144/104 (01/13 1220) SpO2:  [80 %-98 %] 98 % (01/13 1220) FiO2 (%):  [40 %] 40 % (01/13 0741) Weight:  [110.7 kg] 110.7 kg (01/12 1933) Physical Exam: General: NAD, well appearing, conversational Cardiovascular: RRR, NRMG Respiratory: Mild wheezing in left lung field, no crackles, rales or rhonchi Abdomen: Soft, NTTP, non-distended Extremities: No edema, cap  refill < 2 sec  Laboratory: Most recent CBC Lab Results  Component Value Date   WBC 5.1 06/04/2022   HGB 10.5 (L) 06/04/2022   HCT 31.0 (L) 06/04/2022   MCV 88.0 06/04/2022   PLT 147 (L) 06/04/2022   Most recent BMP    Latest Ref Rng & Units 06/04/2022    5:49 AM  BMP  Sodium 135 - 145 mmol/L 139   Potassium 3.5 - 5.1 mmol/L 4.5      Holley Bouche, MD 06/04/2022, 1:04 PM  PGY-2, [place] Intern pager: [phone], text pages  welcome Secure chat group [place]

## 2022-06-04 NOTE — Progress Notes (Signed)
FMTS Brief Progress Note  S:Assessed patient with Dr. Zigmund Daniel. Patient feeling overall well. She complains of some chest heaviness. Denies any chest pain/radiation. Denies any shortness of breath.  He said that she did have an episode of coughing after she was eating.  At that time she had had some desaturations and was put on nonrebreather per MD note.  She is currently on 4 L nasal cannula which is at her baseline.  She is feeling well except continues to have cough that is not productive.  Denies any hemoptysis.  Denies any potation's that she can feel.   O: BP (!) 102/91 (BP Location: Right Arm)   Pulse (!) 131   Temp 98.6 F (37 C) (Axillary)   Resp (!) 24   Ht '5\' 5"'$  (1.651 m)   Wt 110.7 kg   SpO2 95%   BMI 40.60 kg/m    Gen: NAD, laying in bed, alert, conversation CV: HR consistently over 130s when we were in the room  A/P: Sinus tachycardia With chest heaviness -STAT EKG -Troponin -Consider reach out to cardiology if remains elevated -Consider CTPE tomorrow  Bladder retention 2 bladder scans >500, will get another bladder scan tonight if remains elevated will place foley. - Orders reviewed. Labs for AM ordered, which was adjusted as needed.    Gerrit Heck, MD 06/04/2022, 7:37 PM PGY-2, [facility] Night Resident  Please page [phone] with questions.

## 2022-06-04 NOTE — ED Notes (Addendum)
ED TO INPATIENT HANDOFF REPORT  ED Nurse Name and Phone #:  Therisa Doyne RN/ [phone]  S Name/Age/Gender [name] [name] 67 y.o. female Room/Bed: 007C/007C  Code Status   Code Status: Full Code  Home/SNF/Other Home Patient oriented to: self, place, time, and situation Is this baseline? Yes   Triage Complete: Triage complete  Chief Complaint Acute hypoxic respiratory failure ([place]) [J96.01]  Triage Note BIBA from UC, increased SOB X3 days. Prior h/o COPD and wears 2LNC at baseline. Given 1 neb tx at [facility] and 2 more duonebs en route with EMS and 125 solumedrol. Satting 88% on her normal 2LNC at [facility].   EMS VS: HR 140s  88% 2LNC  150/90  CBG = 155  20 G L hand    Allergies Allergies  Allergen Reactions   Bactrim [Sulfamethoxazole-Trimethoprim] Anaphylaxis   Amoxicillin Hives   Diclofenac Tinitus    swelling in feet    Level of Care/Admitting Diagnosis ED Disposition     ED Disposition  Admit   Condition  --   Comment  Hospital Area: [facility] [[id number]]  Level of Care: Progressive [102]  Admit to Progressive based on following criteria: RESPIRATORY PROBLEMS hypoxemic/hypercapnic respiratory failure that is responsive to NIPPV (BiPAP) or High Flow Nasal Cannula (6-80 lpm). Frequent assessment/intervention, no > Q2 hrs < Q4 hrs, to maintain oxygenation and pulmonary hygiene.  May admit patient to [facility] or [facility] if equivalent level of care is available:: No  Covid Evaluation: Confirmed COVID Negative  Diagnosis: Acute hypoxic respiratory failure [facility]) [[id number]]  Admitting Physician: Lind Covert [[id number]]  Attending Physician: Lind Covert [[id number]]  Certification:: I certify this patient will need inpatient services for at least 2 midnights  Estimated Length of Stay: 2          B Medical/Surgery History Past Medical History:  Diagnosis Date   Anxiety    Bipolar 1 disorder ([place])    Cancer ([place])    colon   Chronic  diarrhea    Followed by Dr Michail Sermon    Complication of anesthesia    Hx resp distress during surgery   COPD (chronic obstructive pulmonary disease) ([place])    Depression    Diabetes mellitus ([place])    External hemorrhoids    GI bleed    Headache(784.0)    Hyperlipidemia    Hypertension    Hypothyroidism    Insomnia    Internal hemorrhoids    Polysubstance abuse ([place]) Quit 2008    H/O methamphetamine and alcohol abuse, clean x 6 years   Shortness of breath    Suicidal thoughts    Tobacco abuse    Tubular adenoma of colon    Past Surgical History:  Procedure Laterality Date   ABDOMINAL HYSTERECTOMY     APPENDECTOMY     COLON SURGERY     "colon repair"   COLONOSCOPY WITH PROPOFOL N/A 12/11/2012   Procedure: COLONOSCOPY WITH PROPOFOL;  Surgeon: Jerene Bears, MD;  Location: [facility];  Service: Gastroenterology;  Laterality: N/A;   FINGER SURGERY     middle left finger   HERNIA REPAIR     x 2   KNEE SURGERY Left    Left knee tendon surgery   TONSILLECTOMY     TUBAL LIGATION     WOUND DEBRIDEMENT Right 02/06/2020   Procedure: SKIN BIOPSY WITH DEBRIDEMENT RIGHT LOWER LEG;  Surgeon: Virl Cagey, MD;  Location: [facility];  Service: General;  Laterality: Right;  A IV Location/Drains/Wounds Patient Lines/Drains/Airways Status     Active Line/Drains/Airways     Name Placement date Placement time Site Days   Peripheral IV 06/03/22 20 G Left Antecubital 06/03/22  1949  Antecubital  1   Peripheral IV 06/03/22 20 G Left Hand 06/03/22  1949  Hand  1   Incision (Closed) 02/06/20 Pretibial Right;Lateral 02/06/20  0822  -- 849   Wound / Incision (Open or Dehisced) 02/09/20 Non-pressure wound Pretibial Right;Medial ulcerated w/ dark circle/hole in proximal aspect of wound 02/09/20  1400  Pretibial  846   Wound / Incision (Open or Dehisced) 02/09/20 Non-pressure wound Toe (Comment  which one) Anterior;Right skin avulsed on lateral side; nail black 02/09/20  1400  Toe (Comment   which one)  846            Intake/Output Last 24 hours  Intake/Output Summary (Last 24 hours) at 06/04/2022 1620 Last data filed at 06/04/2022 0546 Gross per 24 hour  Intake --  Output 500 ml  Net -500 ml    Labs/Imaging Results for orders placed or performed during the hospital encounter of 06/03/22 (from the past 48 hour(s))  Resp panel by RT-PCR (RSV, Flu A&B, Covid) Anterior Nasal Swab     Status: None   Collection Time: 06/03/22  7:25 PM   Specimen: Anterior Nasal Swab  Result Value Ref Range   SARS Coronavirus 2 by RT PCR NEGATIVE NEGATIVE    Comment: (NOTE) SARS-CoV-2 target nucleic acids are NOT DETECTED.  The SARS-CoV-2 RNA is generally detectable in upper respiratory specimens during the acute phase of infection. The lowest concentration of SARS-CoV-2 viral copies this assay can detect is 138 copies/mL. A negative result does not preclude SARS-Cov-2 infection and should not be used as the sole basis for treatment or other patient management decisions. A negative result may occur with  improper specimen collection/handling, submission of specimen other than nasopharyngeal swab, presence of viral mutation(s) within the areas targeted by this assay, and inadequate number of viral copies(<138 copies/mL). A negative result must be combined with clinical observations, patient history, and epidemiological information. The expected result is Negative.  Fact Sheet for Patients:  EntrepreneurPulse.com.au  Fact Sheet for Healthcare Providers:  IncredibleEmployment.be  This test is no t yet approved or cleared by the [place] FDA and  has been authorized for detection and/or diagnosis of SARS-CoV-2 by FDA under an Emergency Use Authorization (EUA). This EUA will remain  in effect (meaning this test can be used) for the duration of the COVID-19 declaration under Section 564(b)(1) of the Act, 21 U.S.C.section 360bbb-3(b)(1), unless  the authorization is terminated  or revoked sooner.       Influenza A by PCR NEGATIVE NEGATIVE   Influenza B by PCR NEGATIVE NEGATIVE    Comment: (NOTE) The Xpert Xpress SARS-CoV-2/FLU/RSV plus assay is intended as an aid in the diagnosis of influenza from Nasopharyngeal swab specimens and should not be used as a sole basis for treatment. Nasal washings and aspirates are unacceptable for Xpert Xpress SARS-CoV-2/FLU/RSV testing.  Fact Sheet for Patients: EntrepreneurPulse.com.au  Fact Sheet for Healthcare Providers: IncredibleEmployment.be  This test is not yet approved or cleared by the [place] FDA and has been authorized for detection and/or diagnosis of SARS-CoV-2 by FDA under an Emergency Use Authorization (EUA). This EUA will remain in effect (meaning this test can be used) for the duration of the COVID-19 declaration under Section 564(b)(1) of the Act, 21 U.S.C. section 360bbb-3(b)(1), unless the authorization is  terminated or revoked.     Resp Syncytial Virus by PCR NEGATIVE NEGATIVE    Comment: (NOTE) Fact Sheet for Patients: EntrepreneurPulse.com.au  Fact Sheet for Healthcare Providers: IncredibleEmployment.be  This test is not yet approved or cleared by the [place] FDA and has been authorized for detection and/or diagnosis of SARS-CoV-2 by FDA under an Emergency Use Authorization (EUA). This EUA will remain in effect (meaning this test can be used) for the duration of the COVID-19 declaration under Section 564(b)(1) of the Act, 21 U.S.C. section 360bbb-3(b)(1), unless the authorization is terminated or revoked.  Performed at [facility], [place] [address], [place], [place] [address]   CBC with Differential     Status: Abnormal   Collection Time: 06/03/22  7:25 PM  Result Value Ref Range   WBC 5.9 4.0 - 10.5 K/uL   RBC 3.87 3.87 - 5.11 MIL/uL   Hemoglobin 10.4 (L) 12.0 -  15.0 g/dL   HCT 33.7 (L) 36.0 - 46.0 %   MCV 87.1 80.0 - 100.0 fL   MCH 26.9 26.0 - 34.0 pg   MCHC 30.9 30.0 - 36.0 g/dL   RDW 15.1 11.5 - 15.5 %   Platelets 163 150 - 400 K/uL   nRBC 0.0 0.0 - 0.2 %   Neutrophils Relative % 72 %   Neutro Abs 4.2 1.7 - 7.7 K/uL   Lymphocytes Relative 19 %   Lymphs Abs 1.1 0.7 - 4.0 K/uL   Monocytes Relative 5 %   Monocytes Absolute 0.3 0.1 - 1.0 K/uL   Eosinophils Relative 2 %   Eosinophils Absolute 0.1 0.0 - 0.5 K/uL   Basophils Relative 1 %   Basophils Absolute 0.0 0.0 - 0.1 K/uL   Immature Granulocytes 1 %   Abs Immature Granulocytes 0.05 0.00 - 0.07 K/uL    Comment: Performed at [facility] [address] [place], [place] [address]  Basic metabolic panel     Status: Abnormal   Collection Time: 06/03/22  7:25 PM  Result Value Ref Range   Sodium 138 135 - 145 mmol/L   Potassium 3.6 3.5 - 5.1 mmol/L   Chloride 96 (L) 98 - 111 mmol/L   CO2 31 22 - 32 mmol/L   Glucose, Bld 176 (H) 70 - 99 mg/dL    Comment: Glucose reference range applies only to samples taken after fasting for at least 8 hours.   BUN 12 8 - 23 mg/dL   Creatinine, Ser 0.64 0.44 - 1.00 mg/dL   Calcium 9.3 8.9 - 10.3 mg/dL   GFR, Estimated >60 >60 mL/min    Comment: (NOTE) Calculated using the CKD-EPI Creatinine Equation (2021)    Anion gap 11 5 - 15    Comment: Performed at [facility] [address] [place], [place] [address]  Troponin I (High Sensitivity)     Status: None   Collection Time: 06/03/22  7:25 PM  Result Value Ref Range   Troponin I (High Sensitivity) 10 <18 ng/L    Comment: (NOTE) Elevated high sensitivity troponin I (hsTnI) values and significant  changes across serial measurements may suggest ACS but many other  chronic and acute conditions are known to elevate hsTnI results.  Refer to the "Links" section for chest pain algorithms and additional  guidance. Performed at [facility], [place] [address], [place], [place] [address]    Blood culture (routine x 2)     Status: None (Preliminary result)   Collection Time:  06/03/22  7:27 PM   Specimen: BLOOD LEFT FOREARM  Result Value Ref Range   Specimen Description BLOOD LEFT FOREARM    Special Requests      BOTTLES DRAWN AEROBIC AND ANAEROBIC Blood Culture adequate volume   Culture      NO GROWTH < 12 HOURS Performed at [facility], [place] [address], [place], [place] [address]    Report Status PENDING   Blood culture (routine x 2)     Status: None (Preliminary result)   Collection Time: 06/03/22  7:32 PM   Specimen: BLOOD  Result Value Ref Range   Specimen Description BLOOD LEFT ANTECUBITAL    Special Requests      BOTTLES DRAWN AEROBIC AND ANAEROBIC Blood Culture results may not be optimal due to an inadequate volume of blood received in culture bottles   Culture      NO GROWTH < 12 HOURS Performed at [facility] [address] [place], [place] [address]    Report Status PENDING   Troponin I (High Sensitivity)     Status: None   Collection Time: 06/03/22  9:25 PM  Result Value Ref Range   Troponin I (High Sensitivity) 9 <18 ng/L    Comment: (NOTE) Elevated high sensitivity troponin I (hsTnI) values and significant  changes across serial measurements may suggest ACS but many other  chronic and acute conditions are known to elevate hsTnI results.  Refer to the "Links" section for chest pain algorithms and additional  guidance. Performed at [facility], [place] [address] [place], [place] [address]   HIV Antibody (routine testing w rflx)     Status: None   Collection Time: 06/04/22  5:30 AM  Result Value Ref Range   HIV Screen 4th Generation wRfx Non Reactive Non Reactive    Comment: Performed at [facility], [place] [address] [place], [place] [address]  CBC     Status: Abnormal   Collection Time: 06/04/22  5:30 AM  Result Value Ref Range   WBC 5.1 4.0 - 10.5 K/uL   RBC 3.68 (L) 3.87 - 5.11 MIL/uL   Hemoglobin 10.0 (L) 12.0 -  15.0 g/dL   HCT 32.4 (L) 36.0 - 46.0 %   MCV 88.0 80.0 - 100.0 fL   MCH 27.2 26.0 - 34.0 pg   MCHC 30.9 30.0 - 36.0 g/dL   RDW 14.9 11.5 - 15.5 %   Platelets 147 (L) 150 - 400 K/uL   nRBC 0.0 0.0 - 0.2 %    Comment: Performed at [facility], [place] [address], [place], [place] [address]  Comprehensive metabolic panel     Status: Abnormal   Collection Time: 06/04/22  5:30 AM  Result Value Ref Range   Sodium 137 135 - 145 mmol/L   Potassium 4.4 3.5 - 5.1 mmol/L   Chloride 94 (L) 98 - 111 mmol/L   CO2 34 (H) 22 - 32 mmol/L   Glucose, Bld 215 (H) 70 - 99 mg/dL    Comment: Glucose reference range applies only to samples taken after fasting for at least 8 hours.   BUN 13 8 - 23 mg/dL   Creatinine, Ser 0.65 0.44 - 1.00 mg/dL   Calcium 8.8 (L) 8.9 - 10.3 mg/dL   Total Protein 6.6 6.5 - 8.1 g/dL   Albumin 3.4 (L) 3.5 - 5.0 g/dL   AST 14 (L) 15 - 41 U/L   ALT 12 0 - 44 U/L  Alkaline Phosphatase 65 38 - 126 U/L   Total Bilirubin 0.4 0.3 - 1.2 mg/dL   GFR, Estimated >60 >60 mL/min    Comment: (NOTE) Calculated using the CKD-EPI Creatinine Equation (2021)    Anion gap 9 5 - 15    Comment: Performed at [facility] [address] [place], [place] [address]  Hemoglobin A1c     Status: Abnormal   Collection Time: 06/04/22  5:30 AM  Result Value Ref Range   Hgb A1c MFr Bld 6.7 (H) 4.8 - 5.6 %    Comment: (NOTE) Pre diabetes:          5.7%-6.4%  Diabetes:              >6.4%  Glycemic control for   <7.0% adults with diabetes    Mean Plasma Glucose 145.59 mg/dL    Comment: Performed at [facility] [address] [place], [place] [address]  Magnesium     Status: None   Collection Time: 06/04/22  5:30 AM  Result Value Ref Range   Magnesium 2.0 1.7 - 2.4 mg/dL    Comment: Performed at [facility] [address], [place], [place] [address]  TSH     Status: None   Collection Time: 06/04/22  5:30 AM  Result Value Ref Range   TSH 0.873 0.350 - 4.500 uIU/mL     Comment: Performed by a 3rd Generation assay with a functional sensitivity of <=0.01 uIU/mL. Performed at [facility], [place] [address] [place], [place] [address]   I-Stat venous blood gas, [facility], [facility], [facility])     Status: Abnormal   Collection Time: 06/04/22  5:49 AM  Result Value Ref Range   pH, Ven 7.358 7.25 - 7.43   pCO2, Ven 66.8 (H) 44 - 60 mmHg   pO2, Ven 76 (H) 32 - 45 mmHg   Bicarbonate 37.6 (H) 20.0 - 28.0 mmol/L   TCO2 40 (H) 22 - 32 mmol/L   O2 Saturation 94 %   Acid-Base Excess 10.0 (H) 0.0 - 2.0 mmol/L   Sodium 139 135 - 145 mmol/L   Potassium 4.5 3.5 - 5.1 mmol/L   Calcium, Ion 1.20 1.15 - 1.40 mmol/L   HCT 31.0 (L) 36.0 - 46.0 %   Hemoglobin 10.5 (L) 12.0 - 15.0 g/dL   Sample type VENOUS    Comment NOTIFIED PHYSICIAN   CBG monitoring, ED     Status: Abnormal   Collection Time: 06/04/22 12:01 PM  Result Value Ref Range   Glucose-Capillary 148 (H) 70 - 99 mg/dL    Comment: Glucose reference range applies only to samples taken after fasting for at least 8 hours.   DG Chest 2 View  Result Date: 06/04/2022 CLINICAL DATA:  Shortness of breath.  Pneumonia. EXAM: CHEST - 2 VIEW COMPARISON:  06/03/2022 FINDINGS: Mild cardiomegaly stable. Mild focal opacity is again seen in the right lower lung adjacent to the right heart border, suspicious for mild infiltrate. Evidence of pneumothorax or pleural effusion. Pulmonary hyperinflation and coarsening of interstitial lung markings is unchanged. Old left rib fracture deformities again noted. IMPRESSION: Mild focal opacity in right lower lung adjacent to the heart border, suspicious for mild infiltrate. COPD. Electronically Signed   By: Marlaine Hind M.D.   On: 06/04/2022 10:19   DG Chest Portable 1 View  Result Date: 06/03/2022 CLINICAL DATA:  Dyspnea, COPD exacerbation EXAM: PORTABLE CHEST 1 VIEW COMPARISON:  10/23/2020 chest radiograph. FINDINGS: Stable  cardiomediastinal silhouette with borderline mild cardiomegaly. No  pneumothorax. No pleural effusion. Patchy opacity obscures the right heart border, new. Healed left mid rib deformity. No overt pulmonary edema. IMPRESSION: Patchy opacity obscures the right heart border, new, cannot exclude pneumonia. Recommend follow-up PA and lateral chest radiographs to resolution. Electronically Signed   By: Ilona Sorrel M.D.   On: 06/03/2022 19:44    Pending Labs Unresulted Labs (From admission, onward)     Start     Ordered   06/03/22 1925  Brain natriuretic peptide  Once,   URGENT        06/03/22 1924            Vitals/Pain Today's Vitals   06/04/22 1431 06/04/22 1500 06/04/22 1530 06/04/22 1540  BP:  116/69 120/80   Pulse:  (!) 117 (!) 106   Resp:  (!) 25 (!) 24   Temp: 98.6 F (37 C)     TempSrc: Axillary     SpO2:  94% 94%   Weight:      Height:      PainSc:    0-No pain    Isolation Precautions No active isolations  Medications Medications  enoxaparin (LOVENOX) injection 40 mg (40 mg Subcutaneous Given 06/04/22 0932)  acetaminophen (TYLENOL) tablet 650 mg (650 mg Oral Given 06/04/22 0932)    Or  acetaminophen (TYLENOL) suppository 650 mg ( Rectal See Alternative 06/04/22 0932)  ipratropium-albuterol (DUONEB) 0.5-2.5 (3) MG/3ML nebulizer solution 3 mL (3 mLs Nebulization Given 06/04/22 1209)  cefTRIAXone (ROCEPHIN) 1 g in sodium chloride 0.9 % 100 mL IVPB (has no administration in time range)  azithromycin (ZITHROMAX) 500 mg in sodium chloride 0.9 % 250 mL IVPB (has no administration in time range)  nicotine (NICODERM CQ - dosed in mg/24 hr) patch 7 mg (has no administration in time range)  predniSONE (DELTASONE) tablet 40 mg (40 mg Oral Given 06/04/22 0932)  insulin aspart (novoLOG) injection 0-20 Units (3 Units Subcutaneous Given 06/04/22 1208)  umeclidinium bromide (INCRUSE ELLIPTA) 62.5 MCG/ACT 1 puff (1 puff Inhalation Given 06/04/22 1334)  amLODipine (NORVASC) tablet 5 mg (5 mg Oral Given 06/04/22 1202)  atorvastatin (LIPITOR) tablet 20 mg (has  no administration in time range)  benztropine (COGENTIN) tablet 0.5 mg (0.5 mg Oral Given 06/04/22 1203)  busPIRone (BUSPAR) tablet 10 mg (10 mg Oral Given 06/04/22 1202)  divalproex (DEPAKOTE) DR tablet 500 mg (500 mg Oral Given 06/04/22 1202)  magnesium oxide (MAG-OX) tablet 400 mg (400 mg Oral Given 06/04/22 1202)  sertraline (ZOLOFT) tablet 200 mg (200 mg Oral Given 06/04/22 1202)  QUEtiapine (SEROQUEL) tablet 25-50 mg (25 mg Oral Given 06/04/22 1202)  magnesium sulfate IVPB 2 g 50 mL (0 g Intravenous Stopped 06/03/22 2124)  lactated ringers bolus 1,000 mL (0 mLs Intravenous Stopped 06/03/22 2124)  cefTRIAXone (ROCEPHIN) 1 g in sodium chloride 0.9 % 100 mL IVPB (0 g Intravenous Stopped 06/03/22 2124)  azithromycin (ZITHROMAX) 500 mg in sodium chloride 0.9 % 250 mL IVPB (0 mg Intravenous Stopped 06/03/22 2124)  ondansetron (ZOFRAN) injection 4 mg (4 mg Intravenous Given 06/03/22 2144)  lactated ringers bolus 1,000 mL (0 mLs Intravenous Stopped 06/04/22 0101)  ketorolac (TORADOL) 15 MG/ML injection 15 mg (15 mg Intravenous Given 06/04/22 0359)    Mobility walks with person assist Low fall risk   Focused Assessments Pt has been tachy in the 120s according to the previous RN. The MD is aware of her HR.    R Recommendations: See Admitting Provider Note  Report given to:   Additional Notes: Pt came in for SOB for 3 days and has a hx of COPD. She also has hx of bipolar. She wears 2L of oxygen at home and has hx of A-fib as well. The previous nurse told me she's also been tachy. The duoneb's are also probably making her HR higher. The MD is aware of her HR. When they first brought her in she was on a Bipap but now she's on 4L of oxygen and doing fine. She also has some urinary retention. They bladder scanned her and did an I&O and got out 527m. She's on Q6 bladder scan checks. Last one was at 1pm. She also has a tendacy to hold her urine. She's got some duoneb treatments earlier. She A&Ox 4. They might  place a foley if she's not able to pee on her own.

## 2022-06-04 NOTE — ED Notes (Signed)
Pt was bladder scanned around 1pm today by the previous day shift RN. They did an I&O and got out 500cc. MD has been made aware.

## 2022-06-04 NOTE — ED Notes (Addendum)
Pt called out for episode of emesis. This writer walked in the room, pt is coughing profusely and cyanotic. Her O2 saturations were at 68% with a good waveform. I put her on 15L NRB until RN arrived. RT called. RT and RN at bedside.

## 2022-06-05 ENCOUNTER — Other Ambulatory Visit: Payer: Self-pay

## 2022-06-05 ENCOUNTER — Inpatient Hospital Stay (HOSPITAL_COMMUNITY): Payer: Medicare Other

## 2022-06-05 DIAGNOSIS — I4892 Unspecified atrial flutter: Secondary | ICD-10-CM

## 2022-06-05 DIAGNOSIS — G8929 Other chronic pain: Secondary | ICD-10-CM

## 2022-06-05 DIAGNOSIS — J9601 Acute respiratory failure with hypoxia: Secondary | ICD-10-CM | POA: Diagnosis not present

## 2022-06-05 DIAGNOSIS — I483 Typical atrial flutter: Secondary | ICD-10-CM

## 2022-06-05 DIAGNOSIS — I4891 Unspecified atrial fibrillation: Secondary | ICD-10-CM

## 2022-06-05 HISTORY — DX: Other chronic pain: G89.29

## 2022-06-05 LAB — CBC
HCT: 31.5 % — ABNORMAL LOW (ref 36.0–46.0)
Hemoglobin: 9.4 g/dL — ABNORMAL LOW (ref 12.0–15.0)
MCH: 26.4 pg (ref 26.0–34.0)
MCHC: 29.8 g/dL — ABNORMAL LOW (ref 30.0–36.0)
MCV: 88.5 fL (ref 80.0–100.0)
Platelets: 149 10*3/uL — ABNORMAL LOW (ref 150–400)
RBC: 3.56 MIL/uL — ABNORMAL LOW (ref 3.87–5.11)
RDW: 15.6 % — ABNORMAL HIGH (ref 11.5–15.5)
WBC: 7.3 10*3/uL (ref 4.0–10.5)
nRBC: 0 % (ref 0.0–0.2)

## 2022-06-05 LAB — COMPREHENSIVE METABOLIC PANEL
ALT: 11 U/L (ref 0–44)
AST: 13 U/L — ABNORMAL LOW (ref 15–41)
Albumin: 3.4 g/dL — ABNORMAL LOW (ref 3.5–5.0)
Alkaline Phosphatase: 56 U/L (ref 38–126)
Anion gap: 10 (ref 5–15)
BUN: 27 mg/dL — ABNORMAL HIGH (ref 8–23)
CO2: 32 mmol/L (ref 22–32)
Calcium: 8.6 mg/dL — ABNORMAL LOW (ref 8.9–10.3)
Chloride: 96 mmol/L — ABNORMAL LOW (ref 98–111)
Creatinine, Ser: 0.73 mg/dL (ref 0.44–1.00)
GFR, Estimated: >60 mL/min (ref >60)
Glucose, Bld: 153 mg/dL — ABNORMAL HIGH (ref 70–99)
Potassium: 4.7 mmol/L (ref 3.5–5.1)
Sodium: 138 mmol/L (ref 135–145)
Total Bilirubin: 0.2 mg/dL — ABNORMAL LOW (ref 0.3–1.2)
Total Protein: 6.4 g/dL — ABNORMAL LOW (ref 6.5–8.1)

## 2022-06-05 LAB — ECHOCARDIOGRAM LIMITED
Height: 65 in
Weight: 3904 oz

## 2022-06-05 LAB — GLUCOSE, CAPILLARY
Glucose-Capillary: 159 mg/dL — ABNORMAL HIGH (ref 70–99)
Glucose-Capillary: 131 mg/dL — ABNORMAL HIGH (ref 70–99)
Glucose-Capillary: 186 mg/dL — ABNORMAL HIGH (ref 70–99)
Glucose-Capillary: 190 mg/dL — ABNORMAL HIGH (ref 70–99)

## 2022-06-05 LAB — MAGNESIUM: Magnesium: 2.2 mg/dL (ref 1.7–2.4)

## 2022-06-05 MED ORDER — LEVALBUTEROL HCL 0.63 MG/3ML IN NEBU
INHALATION_SOLUTION | RESPIRATORY_TRACT | Status: AC
  Filled 2022-06-05: qty 3

## 2022-06-05 MED ORDER — INFLUENZA VAC A&B SA ADJ QUAD 0.5 ML IM PRSY
0.5000 mL | PREFILLED_SYRINGE | INTRAMUSCULAR | Status: AC
  Administered 2022-06-08: 0.5 mL via INTRAMUSCULAR
  Filled 2022-06-05 (×2): qty 0.5

## 2022-06-05 MED ORDER — POLYETHYLENE GLYCOL 3350 17 G PO PACK
17.0000 g | PACK | Freq: Every day | ORAL | Status: DC
  Administered 2022-06-05 – 2022-06-11 (×5): 17 g via ORAL
  Filled 2022-06-05 (×6): qty 1

## 2022-06-05 MED ORDER — LEVALBUTEROL HCL 1.25 MG/0.5ML IN NEBU
1.2500 mg | INHALATION_SOLUTION | Freq: Four times a day (QID) | RESPIRATORY_TRACT | Status: DC
  Administered 2022-06-05 – 2022-06-06 (×3): 1.25 mg via RESPIRATORY_TRACT
  Filled 2022-06-05 (×6): qty 0.5

## 2022-06-05 MED ORDER — QUETIAPINE FUMARATE 25 MG PO TABS
25.0000 mg | ORAL_TABLET | Freq: Two times a day (BID) | ORAL | Status: DC
  Administered 2022-06-05 – 2022-06-11 (×12): 25 mg via ORAL
  Filled 2022-06-05 (×12): qty 1

## 2022-06-05 MED ORDER — APIXABAN 5 MG PO TABS
5.0000 mg | ORAL_TABLET | Freq: Two times a day (BID) | ORAL | Status: DC
  Administered 2022-06-05 – 2022-06-11 (×13): 5 mg via ORAL
  Filled 2022-06-05 (×13): qty 1

## 2022-06-05 MED ORDER — HYDROCODONE-ACETAMINOPHEN 5-325 MG PO TABS
1.0000 | ORAL_TABLET | Freq: Three times a day (TID) | ORAL | Status: DC | PRN
  Administered 2022-06-05: 1 via ORAL
  Filled 2022-06-05 (×2): qty 1

## 2022-06-05 MED ORDER — DILTIAZEM HCL 60 MG PO TABS: 60.0000 mg | ORAL_TABLET | Freq: Once | ORAL | Status: DC

## 2022-06-05 MED ORDER — IPRATROPIUM-ALBUTEROL 0.5-2.5 (3) MG/3ML IN SOLN
3.0000 mL | Freq: Four times a day (QID) | RESPIRATORY_TRACT | Status: DC | PRN
  Administered 2022-06-05 – 2022-06-09 (×3): 3 mL via RESPIRATORY_TRACT
  Filled 2022-06-05 (×3): qty 3

## 2022-06-05 MED ORDER — QUETIAPINE FUMARATE 50 MG PO TABS
50.0000 mg | ORAL_TABLET | Freq: Every day | ORAL | Status: DC
  Administered 2022-06-05 – 2022-06-10 (×6): 50 mg via ORAL
  Filled 2022-06-05 (×6): qty 1

## 2022-06-05 MED ORDER — HEPARIN (PORCINE) 25000 UT/250ML-% IV SOLN
1300.0000 [IU]/h | INTRAVENOUS | Status: DC
  Administered 2022-06-05: 1300 [IU]/h via INTRAVENOUS
  Filled 2022-06-05: qty 250

## 2022-06-05 MED ORDER — HYDROCODONE-ACETAMINOPHEN 5-325 MG PO TABS
1.0000 | ORAL_TABLET | Freq: Three times a day (TID) | ORAL | Status: DC
  Administered 2022-06-05 – 2022-06-11 (×18): 1 via ORAL
  Filled 2022-06-05 (×18): qty 1

## 2022-06-05 MED ORDER — HEPARIN BOLUS VIA INFUSION
4000.0000 [IU] | Freq: Once | INTRAVENOUS | Status: AC
  Administered 2022-06-05: 4000 [IU] via INTRAVENOUS
  Filled 2022-06-05: qty 4000

## 2022-06-05 MED ORDER — PERFLUTREN LIPID MICROSPHERE
1.0000 mL | INTRAVENOUS | Status: AC | PRN
  Administered 2022-06-05: 2 mL via INTRAVENOUS

## 2022-06-05 MED ORDER — SENNOSIDES-DOCUSATE SODIUM 8.6-50 MG PO TABS
1.0000 | ORAL_TABLET | Freq: Every day | ORAL | Status: DC
  Administered 2022-06-05 – 2022-06-10 (×6): 1 via ORAL
  Filled 2022-06-05 (×6): qty 1

## 2022-06-05 MED ORDER — DILTIAZEM HCL 60 MG PO TABS
60.0000 mg | ORAL_TABLET | Freq: Four times a day (QID) | ORAL | Status: DC
  Administered 2022-06-05: 60 mg via ORAL
  Filled 2022-06-05: qty 1

## 2022-06-05 MED ORDER — LEVALBUTEROL HCL 1.25 MG/0.5ML IN NEBU
1.2500 mg | INHALATION_SOLUTION | Freq: Four times a day (QID) | RESPIRATORY_TRACT | Status: DC | PRN
  Administered 2022-06-05: 1.25 mg via RESPIRATORY_TRACT
  Filled 2022-06-05 (×2): qty 0.5

## 2022-06-05 MED ORDER — DILTIAZEM HCL 60 MG PO TABS
90.0000 mg | ORAL_TABLET | Freq: Four times a day (QID) | ORAL | Status: DC
  Administered 2022-06-05 – 2022-06-07 (×8): 90 mg via ORAL
  Filled 2022-06-05 (×8): qty 2

## 2022-06-05 NOTE — Progress Notes (Signed)
FMTS Brief Progress Note  Went to bedside with Dr. Jinny Sanders to evaluate the patient.  Once arriving the room, the patient reports that she has had up-and-down's all night but reports she has had increased wheezing and difficulty breathing this a.m.  On physical, patient has significant expiratory wheezing diffusely with increased work of breathing and retractions.  She is able to maintain oxygenation in the low 90s on 4 L.  She was previously on 2 L nasal cannula at home but recently was increased to 4 L per PCP. Patient did not receive Duoneb @ 0400 likely 2/2 to continued and sustained tachycardia. Ordered Levalbuterol for less effect on HR. Continuing with CXR and oral prednisone. IS, chest PT, also important in recovery. Additionally, has not had home Norco x3 days and may have a component of withdrawal contributing to the tachycardia. Deferring to cardiology expertise with recommendations for tachycardia and anticipate improvement of tachycardia with pulmonary status. Low threshold for CCM involvement but hopeful that she will improve today. H/o intubation in past ~2-3 years ago.   Relaying all information to day team and discussed plan with patient. Awaiting echo and CXR.    Erskine Emery, MD 06/05/2022, 6:41 AM PGY-2, [facility] Night Resident  Please page [phone] with questions.

## 2022-06-05 NOTE — Progress Notes (Signed)
HR 148, BP 122/80 one hour after Cardizem '90mg'$  po given.  NP Maylon Peppers notified of continued elevated HR. Patient SOB with minimal activity with audible expiratory wheezing.  RT notified, will come give nebulizer treatment.

## 2022-06-05 NOTE — Assessment & Plan Note (Signed)
Has chronic knee and hip pain due to OA.  Home medication includes Norco 3 times daily as needed which patient states she takes 3 times daily regularly. -Continue home Norco 3 times daily

## 2022-06-05 NOTE — Progress Notes (Incomplete)
Rounding Note    Patient Name: [name] [name] Date of Encounter: 06/05/2022  [place] Cardiologist: Carlyle Dolly, MD ***  Subjective   ***  Inpatient Medications    Scheduled Meds:  apixaban  5 mg Oral BID   atorvastatin  20 mg Oral QHS   benztropine  0.5 mg Oral BID   busPIRone  10 mg Oral TID   diltiazem  90 mg Oral Q6H   divalproex  500 mg Oral BID   HYDROcodone-acetaminophen  1 tablet Oral TID   [START ON 06/06/2022] influenza vaccine adjuvanted  0.5 mL Intramuscular Tomorrow-1000   insulin aspart  0-20 Units Subcutaneous TID WC   levalbuterol  1.25 mg Nebulization Q6H   magnesium oxide  400 mg Oral Daily   polyethylene glycol  17 g Oral Daily   predniSONE  40 mg Oral Daily   QUEtiapine  25 mg Oral BID   And   QUEtiapine  50 mg Oral QHS   senna-docusate  1 tablet Oral QHS   sertraline  200 mg Oral Daily   umeclidinium bromide  1 puff Inhalation Daily   Continuous Infusions:  azithromycin Stopped (06/05/22 0013)   cefTRIAXone (ROCEPHIN)  IV Stopped (06/04/22 2135)   PRN Meds: acetaminophen **OR** acetaminophen, ipratropium-albuterol, nicotine   Vital Signs    Vitals:   06/05/22 1636 06/05/22 1749 06/05/22 2014 06/05/22 2038  BP: (!) 145/89 (!) 133/92 112/70   Pulse: (!) 130 (!) 123 76   Resp: '20 20 20   '$ Temp: 98.8 F (37.1 C)  99 F (37.2 C)   TempSrc: Oral  Oral   SpO2: 97% 92% 94% 96%  Weight:      Height:        Intake/Output Summary (Last 24 hours) at 06/05/2022 2059 Last data filed at 06/05/2022 1745 Gross per 24 hour  Intake 1133.35 ml  Output 675 ml  Net 458.35 ml      06/03/2022    7:33 PM 02/22/2020    8:17 PM 02/22/2020    6:48 AM  Last 3 Weights  Weight (lbs) 244 lb 195 lb 1.7 oz 198 lb 13.7 oz  Weight (kg) 110.678 kg 88.5 kg 90.2 kg      Telemetry    *** - Personally Reviewed  ECG    *** - Personally Reviewed  Physical Exam  *** GEN: No acute distress.   Neck: No JVD Cardiac: RRR, no murmurs, rubs,  or gallops.  Respiratory: Clear to auscultation bilaterally. GI: Soft, nontender, non-distended  MS: No edema; No deformity. Neuro:  Nonfocal  Psych: Normal affect   Labs    High Sensitivity Troponin:   Recent Labs  Lab 06/03/22 1925 06/03/22 2125 06/04/22 1937 06/04/22 2141  TROPONINIHS '10 9 9 10     '$ Chemistry Recent Labs  Lab 06/03/22 1925 06/04/22 0530 06/04/22 0549 06/05/22 0150  NA 138 137 139 138  K 3.6 4.4 4.5 4.7  CL 96* 94*  --  96*  CO2 31 34*  --  32  GLUCOSE 176* 215*  --  153*  BUN 12 13  --  27*  CREATININE 0.64 0.65  --  0.73  CALCIUM 9.3 8.8*  --  8.6*  MG  --  2.0  --  2.2  PROT  --  6.6  --  6.4*  ALBUMIN  --  3.4*  --  3.4*  AST  --  14*  --  13*  ALT  --  12  --  11  ALKPHOS  --  65  --  56  BILITOT  --  0.4  --  0.2*  GFRNONAA >60 >60  --  >60  ANIONGAP 11 9  --  10    Lipids No results for input(s): "CHOL", "TRIG", "HDL", "LABVLDL", "LDLCALC", "CHOLHDL" in the last 168 hours.  Hematology Recent Labs  Lab 06/03/22 1925 06/04/22 0530 06/04/22 0549 06/05/22 0150  WBC 5.9 5.1  --  7.3  RBC 3.87 3.68*  --  3.56*  HGB 10.4* 10.0* 10.5* 9.4*  HCT 33.7* 32.4* 31.0* 31.5*  MCV 87.1 88.0  --  88.5  MCH 26.9 27.2  --  26.4  MCHC 30.9 30.9  --  29.8*  RDW 15.1 14.9  --  15.6*  PLT 163 147*  --  149*   Thyroid  Recent Labs  Lab 06/04/22 0530  TSH 0.873    BNP Recent Labs  Lab 06/04/22 1916  BNP 435.8*    DDimer No results for input(s): "DDIMER" in the last 168 hours.   Radiology    ECHOCARDIOGRAM LIMITED  Result Date: 06/05/2022    ECHOCARDIOGRAM REPORT   Patient Name:   Keylin Ferryman Date of Exam: 06/05/2022 Medical Rec #:  [id number]         Height:       65.0 in Accession #:    [id number]        Weight:       244.0 lb Date of Birth:  [birth year]         BSA:          2.153 m Patient Age:    42 years          BP:           105/74 mmHg Patient Gender: F                 HR:           150 bpm. Exam Location:  Inpatient Procedure:  Limited Echo and Intracardiac Opacification Agent Indications:    I48.92* Unspecified atrial flutter  History:        Patient has prior history of Echocardiogram examinations, most                 recent 01/29/2020. COPD, Arrythmias:Atrial Flutter; Risk                 Factors:Hypertension, Diabetes and Dyslipidemia.  Sonographer:    Raquel Sarna Senior RDCS Referring Phys: Erskine Emery  Sonographer Comments: Limited to assess for EF prior to cardioversion. IMPRESSIONS  1. Limited echo to assess LVEF.  2. Left ventricular ejection fraction, by estimation, is 45 to 50%. The left ventricle has mildly decreased function. The left ventricle demonstrates global hypokinesis. There is mild concentric left ventricular hypertrophy.  3. The mitral valve is degenerative. Severe mitral annular calcification.  4. Patient is in Afib with RVR during study Comparison(s): Compared to prior study in 2021, the LVEF has dropped to 45-50% from 55-60% in the setting of Afib with RVR. FINDINGS  Left Ventricle: Left ventricular ejection fraction, by estimation, is 45 to 50%. The left ventricle has mildly decreased function. The left ventricle demonstrates global hypokinesis. Definity contrast agent was given IV to delineate the left ventricular  endocardial borders. The left ventricular internal cavity size was normal in size. There is mild concentric left ventricular hypertrophy. Mitral Valve: The mitral valve is degenerative in appearance. There is moderate thickening of the mitral valve leaflet(s). There is moderate calcification  of the mitral valve leaflet(s). Severe mitral annular calcification. Gwyndolyn Kaufman MD Electronically signed by Gwyndolyn Kaufman MD Signature Date/Time: 06/05/2022/12:14:47 PM    Final    DG CHEST PORT 1 VIEW  Result Date: 06/05/2022 CLINICAL DATA:  67 year old female with shortness of breath. EXAM: PORTABLE CHEST 1 VIEW COMPARISON:  Chest radiographs 06/04/2022 and earlier. FINDINGS: Portable AP semi upright  view at 0756 hours. Cardiomegaly, new from a 2017 chest CT. Stable cardiac size and mediastinal contours. Relatively large lung volumes. Mild chronic appearing increased interstitial markings. But improved perihilar ventilation from the previous exams. Probable mild lung scarring along the right heart border. No pneumothorax, pleural effusion, or confluent pulmonary opacity. Visualized tracheal air column is within normal limits. No acute osseous abnormality identified. Paucity of bowel gas the visible abdomen. IMPRESSION: 1. Cardiomegaly. Suspicion of chronic hyperinflation and lung scarring along the right heart border. 2. No convincing acute cardiopulmonary abnormality. Electronically Signed   By: Genevie Ann M.D.   On: 06/05/2022 08:08   DG Chest 2 View  Result Date: 06/04/2022 CLINICAL DATA:  Shortness of breath.  Pneumonia. EXAM: CHEST - 2 VIEW COMPARISON:  06/03/2022 FINDINGS: Mild cardiomegaly stable. Mild focal opacity is again seen in the right lower lung adjacent to the right heart border, suspicious for mild infiltrate. Evidence of pneumothorax or pleural effusion. Pulmonary hyperinflation and coarsening of interstitial lung markings is unchanged. Old left rib fracture deformities again noted. IMPRESSION: Mild focal opacity in right lower lung adjacent to the heart border, suspicious for mild infiltrate. COPD. Electronically Signed   By: Marlaine Hind M.D.   On: 06/04/2022 10:19    Cardiac Studies   ***  Patient Profile     67 y.o. female with history of COPD on home O2, DM, bipolar disorder, HTN, Afib who presented with acute COPD exacerbation with course complicated by Afib with RVR for which Cardiology was consulted.  Assessment & Plan    #Afluterr with RVR:  #Acute COPD Exacerbation:  {Are we signing off today?:[id number]}  For questions or updates, please contact [place] Please consult [email or web address] for contact info under        Signed, Freada Bergeron, MD   06/05/2022, 8:59 PM

## 2022-06-05 NOTE — Progress Notes (Signed)
Pt's HR sustaining in the 140's, pt stated that she is having increased work of breathing, O2 saturations maintained @ 92% on 4L [place] of supplemental O2. Attempted to notify Jinny Sanders, MD.   Elaina Hoops, RN

## 2022-06-05 NOTE — Consult Note (Signed)
Cardiology Consultation   Patient ID: [name] [name] MRN: [id number]; DOB: Apr 10, 1956  Admit date: 06/03/2022 Date of Consult: 06/05/2022  PCP:  [facility] Cardiologist:  Carlyle Dolly, MD        Patient Profile:   [name] [name] is a 67 y.o. female with a hx of COPD on 2L of O2 at home, T2DM, bipolar disorder and hypertension who is being seen 06/05/2022 for the evaluation of tachyarrhythmia at the request of Family Medicine.  History of Present Illness:   Ms. [name] [name] presented to the ED on 01/12 complaining of a 3 day history of shortness of breath and cough. She initially saw her PCP earlier that day, was found to be hypoxic in the clinic and was sent to the ER. Denies fever at home, no sick contracts.  On arrival to the ED he was hypertensive (151/108), tachycardic in the 150s and with RR of 26. Patient's current vitals: BP 133/79, HR 128, RR 18 Relevant workup: Resp viral panel -ve, Hb 10.4 (from 13 two years prior), normal white count, -ve troponins, -ve Bcx, HbA1c 6.7, TSH 0.87, BNP 435. CXR: cardiomegaly with increased vascular markings. ECG: afib with RVR  Prior CV workup: Echo in 01/2020: normal LV function (55-60%), no RWMA, no significant valvular disease.  Patient is a former smoker. She started at the age of 67 years old and quit a few years ago. 3/4 pack per day. Still vapes. She lives alone, retired last year from working at [employer].   Past Medical History:  Diagnosis Date   Anxiety    Bipolar 1 disorder ([place])    Cancer (HCC)    colon   Chronic diarrhea    Followed by Dr Michail Sermon    Complication of anesthesia    Hx resp distress during surgery   COPD (chronic obstructive pulmonary disease) ([place])    Depression    Diabetes mellitus ([place])    External hemorrhoids    GI bleed    Headache(784.0)    Hyperlipidemia    Hypertension    Hypothyroidism    Insomnia    Internal hemorrhoids     Polysubstance abuse ([place]) Quit 2008    H/O methamphetamine and alcohol abuse, clean x 6 years   Shortness of breath    Suicidal thoughts    Tobacco abuse    Tubular adenoma of colon     Past Surgical History:  Procedure Laterality Date   ABDOMINAL HYSTERECTOMY     APPENDECTOMY     COLON SURGERY     "colon repair"   COLONOSCOPY WITH PROPOFOL N/A 12/11/2012   Procedure: COLONOSCOPY WITH PROPOFOL;  Surgeon: Jerene Bears, MD;  Location: [facility];  Service: Gastroenterology;  Laterality: N/A;   FINGER SURGERY     middle left finger   HERNIA REPAIR     x 2   KNEE SURGERY Left    Left knee tendon surgery   TONSILLECTOMY     TUBAL LIGATION     WOUND DEBRIDEMENT Right 02/06/2020   Procedure: SKIN BIOPSY WITH DEBRIDEMENT RIGHT LOWER LEG;  Surgeon: Virl Cagey, MD;  Location: [facility];  Service: General;  Laterality: Right;     Home Medications:  Prior to Admission medications   Medication Sig Start Date End Date Taking? Authorizing Provider  albuterol (PROVENTIL) (5 MG/ML) 0.5% nebulizer solution Take 2.5 mg by nebulization every 6 (six) hours as needed for wheezing or shortness of breath. 04/16/12  Yes Bonnielee Haff, MD  amitriptyline (ELAVIL) 100 MG tablet Take 100 mg by mouth at bedtime. 09/20/20  Yes [provider]  amLODipine (NORVASC) 5 MG tablet Take 5 mg by mouth daily.   Yes [provider]  atorvastatin (LIPITOR) 20 MG tablet Take 20 mg by mouth at bedtime.   Yes [provider]  benztropine (COGENTIN) 0.5 MG tablet Take 1 tablet (0.5 mg total) by mouth 2 (two) times daily. 02/27/20  Yes Regalado, Belkys A, MD  busPIRone (BUSPAR) 10 MG tablet Take 10 mg by mouth 3 (three) times daily. 09/20/20  Yes [provider]  COMBIVENT RESPIMAT 20-100 MCG/ACT AERS respimat Inhale 2 puffs into the lungs every 6 (six) hours as needed for wheezing or shortness of breath.   Yes [provider]  divalproex (DEPAKOTE) 500 MG DR tablet Take 500 mg  by mouth 2 (two) times daily. 09/20/20  Yes [provider]  HYDROcodone-acetaminophen (NORCO/VICODIN) 5-325 MG tablet Take 1 tablet by mouth 3 (three) times daily as needed for moderate pain or severe pain.   Yes [provider]  lisinopril-hydrochlorothiazide (ZESTORETIC) 10-12.5 MG tablet Take 1 tablet by mouth daily.   Yes [provider]  magnesium oxide (MAG-OX) 400 MG tablet Take 400 mg by mouth daily. 02/27/22  Yes [provider]  OXYGEN Inhale 2 L into the lungs daily.   Yes [provider]  QUEtiapine (SEROQUEL) 25 MG tablet Take 1 tablet (25 mg total) by mouth at bedtime. Patient taking differently: Take 25-50 mg by mouth 3 (three) times daily. Take '25mg'$  every morning,'25mg'$  at noon and '50mg'$  at night. 02/27/20  Yes Regalado, Belkys A, MD  sertraline (ZOLOFT) 100 MG tablet Take 200 mg by mouth daily.    Yes [provider]  SPIRIVA HANDIHALER 18 MCG inhalation capsule Place 1 capsule into inhaler and inhale daily.  12/17/19  Yes [provider]  traZODone (DESYREL) 50 MG tablet Take 50-100 mg by mouth at bedtime as needed for sleep.   Yes [provider]  Vitamin D, Ergocalciferol, (DRISDOL) 1.25 MG (50000 UNIT) CAPS capsule Take 50,000 Units by mouth once a week. Mondays 04/27/22  Yes [provider]  tiZANidine (ZANAFLEX) 4 MG tablet Take 4 mg by mouth at bedtime. 04/27/22   [provider]  valproic acid (DEPAKENE) 250 MG capsule Take 2 capsules (500 mg total) by mouth 2 (two) times daily. Patient not taking: Reported on 10/23/2020 02/27/20 03/28/20  Elmarie Shiley, MD    Inpatient Medications: Scheduled Meds:  atorvastatin  20 mg Oral QHS   benztropine  0.5 mg Oral BID   busPIRone  10 mg Oral TID   diltiazem  60 mg Oral Q6H   divalproex  500 mg Oral BID   insulin aspart  0-20 Units Subcutaneous TID WC   ipratropium-albuterol  3 mL Nebulization Q4H   levalbuterol       levalbuterol        magnesium oxide  400 mg Oral Daily   predniSONE  40 mg Oral Daily   QUEtiapine  25-50 mg Oral TID   sertraline  200 mg Oral Daily   umeclidinium bromide  1 puff Inhalation Daily   Continuous Infusions:  azithromycin 500 mg (06/04/22 2310)   cefTRIAXone (ROCEPHIN)  IV 1 g (06/04/22 2104)   heparin 1,300 Units/hr (06/05/22 0555)   PRN Meds: acetaminophen **OR** acetaminophen, HYDROcodone-acetaminophen, levalbuterol, levalbuterol, levalbuterol, nicotine  Allergies:    Allergies  Allergen Reactions   Bactrim [Sulfamethoxazole-Trimethoprim]  Anaphylaxis   Amoxicillin Hives   Diclofenac Tinitus    swelling in feet    Social History:   Social History   Socioeconomic History   Marital status: Divorced    Spouse name: Not on file   Number of children: 2   Years of education: Not on file   Highest education level: Not on file  Occupational History   Occupation: Disabled.  Tobacco Use   Smoking status: Every Day    Packs/day: 1.00    Years: 42.00    Total pack years: 42.00    Types: Cigarettes   Smokeless tobacco: Never  Substance and Sexual Activity   Alcohol use: Yes    Comment: occ   Drug use: No   Sexual activity: Not on file  Other Topics Concern   Not on file  Social History Narrative   Divorced.  Lives with daughter in [place]. Patient moved from [place] state to [place] in August of 2013. Patient has remained sober from alcohol and amphetamine for about 6 years.   Social Determinants of Health   Financial Resource Strain: Not on file  Food Insecurity: No Food Insecurity (06/04/2022)   Hunger Vital Sign    Worried About Running Out of Food in the Last Year: Never true    Ran Out of Food in the Last Year: Never true  Transportation Needs: No Transportation Needs (06/04/2022)   PRAPARE - Hydrologist (Medical): No    Lack of Transportation (Non-Medical): No  Physical Activity: Not on file  Stress: Not on file  Social  Connections: Not on file  Intimate Partner Violence: Not At Risk (06/04/2022)   Humiliation, Afraid, Rape, and Kick questionnaire    Fear of Current or Ex-Partner: No    Emotionally Abused: No    Physically Abused: No    Sexually Abused: No    Family History:   Family History  Problem Relation Age of Onset   Multiple sclerosis Mother    Alcoholism Father    Depression Brother    Emphysema Maternal Grandfather        smoked   Breast cancer Neg Hx    Colon cancer Neg Hx    Esophageal cancer Neg Hx    Colon polyps Neg Hx    Stomach cancer Neg Hx    Pancreatic cancer Neg Hx    Liver disease Neg Hx      ROS:  Please see the history of present illness.  All other ROS reviewed and negative.     Physical Exam/Data:   Vitals:   06/04/22 2252 06/04/22 2312 06/05/22 0018 06/05/22 0422  BP: 133/79 133/79  114/86  Pulse: (!) 132  (!) 128 (!) 125  Resp: '20  18 20  '$ Temp: 97.6 F (36.4 C)   98 F (36.7 C)  TempSrc: Oral   Oral  SpO2: 94%   93%  Weight:      Height:        Intake/Output Summary (Last 24 hours) at 06/05/2022 0642 Last data filed at 06/04/2022 2354 Gross per 24 hour  Intake --  Output 600 ml  Net -600 ml      06/03/2022    7:33 PM 02/22/2020    8:17 PM 02/22/2020    6:48 AM  Last 3 Weights  Weight (lbs) 244 lb 195 lb 1.7 oz 198 lb 13.7 oz  Weight (kg) 110.678 kg 88.5 kg 90.2 kg     Body mass index  is 40.6 kg/m.  General:  Well nourished, well developed, in no acute distress HEENT: normal Neck: no JVD Cardiac:  irregularly irregular rhythm Lungs:  coarse breath sounds bilaterally Abd: soft, nontender, no hepatomegaly  Ext: no edema Musculoskeletal:  No deformities, BUE and BLE strength normal and equal Skin: warm and dry  Neuro:  CNs 2-12 intact, no focal abnormalities noted Psych:  Normal affect   EKG:  The EKG was personally reviewed and demonstrates:  afib with RVR Telemetry:  Telemetry was personally reviewed and demonstrates:    Relevant CV  Studies:   Laboratory Data:  High Sensitivity Troponin:   Recent Labs  Lab 06/03/22 1925 06/03/22 2125 06/04/22 1937 06/04/22 2141  TROPONINIHS '10 9 9 10     '$ Chemistry Recent Labs  Lab 06/03/22 1925 06/04/22 0530 06/04/22 0549 06/05/22 0150  NA 138 137 139 138  K 3.6 4.4 4.5 4.7  CL 96* 94*  --  96*  CO2 31 34*  --  32  GLUCOSE 176* 215*  --  153*  BUN 12 13  --  27*  CREATININE 0.64 0.65  --  0.73  CALCIUM 9.3 8.8*  --  8.6*  MG  --  2.0  --  2.2  GFRNONAA >60 >60  --  >60  ANIONGAP 11 9  --  10    Recent Labs  Lab 06/04/22 0530 06/05/22 0150  PROT 6.6 6.4*  ALBUMIN 3.4* 3.4*  AST 14* 13*  ALT 12 11  ALKPHOS 65 56  BILITOT 0.4 0.2*   Lipids No results for input(s): "CHOL", "TRIG", "HDL", "LABVLDL", "LDLCALC", "CHOLHDL" in the last 168 hours.  Hematology Recent Labs  Lab 06/03/22 1925 06/04/22 0530 06/04/22 0549 06/05/22 0150  WBC 5.9 5.1  --  7.3  RBC 3.87 3.68*  --  3.56*  HGB 10.4* 10.0* 10.5* 9.4*  HCT 33.7* 32.4* 31.0* 31.5*  MCV 87.1 88.0  --  88.5  MCH 26.9 27.2  --  26.4  MCHC 30.9 30.9  --  29.8*  RDW 15.1 14.9  --  15.6*  PLT 163 147*  --  149*   Thyroid  Recent Labs  Lab 06/04/22 0530  TSH 0.873    BNP Recent Labs  Lab 06/04/22 1916  BNP 435.8*    DDimer No results for input(s): "DDIMER" in the last 168 hours.   Radiology/Studies:  DG Chest 2 View  Result Date: 06/04/2022 CLINICAL DATA:  Shortness of breath.  Pneumonia. EXAM: CHEST - 2 VIEW COMPARISON:  06/03/2022 FINDINGS: Mild cardiomegaly stable. Mild focal opacity is again seen in the right lower lung adjacent to the right heart border, suspicious for mild infiltrate. Evidence of pneumothorax or pleural effusion. Pulmonary hyperinflation and coarsening of interstitial lung markings is unchanged. Old left rib fracture deformities again noted. IMPRESSION: Mild focal opacity in right lower lung adjacent to the heart border, suspicious for mild infiltrate. COPD. Electronically  Signed   By: Marlaine Hind M.D.   On: 06/04/2022 10:19   DG Chest Portable 1 View  Result Date: 06/03/2022 CLINICAL DATA:  Dyspnea, COPD exacerbation EXAM: PORTABLE CHEST 1 VIEW COMPARISON:  10/23/2020 chest radiograph. FINDINGS: Stable cardiomediastinal silhouette with borderline mild cardiomegaly. No pneumothorax. No pleural effusion. Patchy opacity obscures the right heart border, new. Healed left mid rib deformity. No overt pulmonary edema. IMPRESSION: Patchy opacity obscures the right heart border, new, cannot exclude pneumonia. Recommend follow-up PA and lateral chest radiographs to resolution. Electronically Signed   By: Janina Mayo.D.  On: 06/03/2022 19:44     Assessment and Plan:   67 y.o. female with a hx of COPD on 2L of O2 at home, T2DM, bipolar disorder and hypertension admitted with acute hypoxic respiratory failure secondary to COPD exacerbation. Cardiology consulted for afib with RVR.  #Atrial fibrillation with RVR - Patient has a prior hx of afib back in 2021. At that time it was in the setting of sepsis and it resolved prior to discharge. The plan was for an outpatient cardiac monitor although I don't see that she went to clinic for follow up. - Some ECGs give the impression of flutter but at close inspection of telemetry, the rhythm is more consistent with afib - Her chadsvasc is 4 - For now, start diltiazem 60 mg Q6 hrs and heparin drip - Echocardiogram tomorrow in am - May consider TEE CV prior to discharge as long as her respiratory status improves. - Thyroid function is normal  Management of COPD and diabetes per primary team. Counseled about vaping cessation  Risk Assessment/Risk Scores:          [id number] Score =    {Click here to calculate score.  4        For questions or updates, please contact [place] Please consult [email or web address] for contact info under    Signed, Ewing Schlein, MD  06/05/2022 6:42 AM

## 2022-06-05 NOTE — Progress Notes (Signed)
Echocardiogram 2D Echocardiogram has been performed.  Oneal Deputy Shakera Ebrahimi RDCS 06/05/2022, 12:40 PM

## 2022-06-05 NOTE — Progress Notes (Signed)
FMTS Interim Progress Note  S: Evaluate patient at bedside with Dr. Joeseph Amor.  Patient reports she is feeling okay, breathing is a little better.  She just got a breathing treatment.  Reports that she has been voiding okay, last void was after dinner around 6:00 PM.  O: BP 112/70 (BP Location: Right Arm)   Pulse 76   Temp 99 F (37.2 C) (Oral)   Resp 20   Ht '5\' 5"'$  (1.651 m)   Wt 110.7 kg   SpO2 96%   BMI 40.60 kg/m   General: Resting in bed comfortably, NAD Cardiovascular: Heart rate 110s on the monitor Pulmonary: Speaking in complete sentences, diffuse coarse expiratory wheezes throughout lung fields, breathing comfortably on nasal cannula  A/P: COPD exacerbation complicated by CAP and atrial flutter with RVR No change to current plan Seems to be voiding normally now, can get bladder scans as needed  Zola Button, MD 06/05/2022, 9:19 PM PGY-3, [facility] Medicine Service pager [phone]

## 2022-06-05 NOTE — Progress Notes (Signed)
  Echocardiogram 2D Echocardiogram attempted at 1020. HR in the 150's. Will attempt again later.  [name] [name] 06/05/2022, 10:26 AM

## 2022-06-05 NOTE — Progress Notes (Signed)
ANTICOAGULATION CONSULT NOTE - Initial Consult  Pharmacy Consult for Heparin Indication: atrial fibrillation  Allergies  Allergen Reactions   Bactrim [Sulfamethoxazole-Trimethoprim] Anaphylaxis   Amoxicillin Hives   Diclofenac Tinitus    swelling in feet    Patient Measurements: Height: '5\' 5"'$  (165.1 cm) Weight: 110.7 kg (244 lb) IBW/kg (Calculated) : 57 Heparin Dosing Weight: 85 kg  Vital Signs: Temp: 98 F (36.7 C) (01/14 0422) Temp Source: Oral (01/14 0422) BP: 114/86 (01/14 0422) Pulse Rate: 125 (01/14 0422)  Labs: Recent Labs    06/03/22 1925 06/03/22 2125 06/04/22 0530 06/04/22 0549 06/04/22 1937 06/04/22 2141 06/05/22 0150  HGB 10.4*  --  10.0* 10.5*  --   --  9.4*  HCT 33.7*  --  32.4* 31.0*  --   --  31.5*  PLT 163  --  147*  --   --   --  149*  CREATININE 0.64  --  0.65  --   --   --  0.73  TROPONINIHS 10 9  --   --  9 10  --     Estimated Creatinine Clearance: 86.9 mL/min (by C-G formula based on SCr of 0.73 mg/dL).   Medical History: Past Medical History:  Diagnosis Date   Anxiety    Bipolar 1 disorder ([place])    Cancer (HCC)    colon   Chronic diarrhea    Followed by Dr Michail Sermon    Complication of anesthesia    Hx resp distress during surgery   COPD (chronic obstructive pulmonary disease) ([place])    Depression    Diabetes mellitus ([place])    External hemorrhoids    GI bleed    Headache(784.0)    Hyperlipidemia    Hypertension    Hypothyroidism    Insomnia    Internal hemorrhoids    Polysubstance abuse ([place]) Quit 2008    H/O methamphetamine and alcohol abuse, clean x 6 years   Shortness of breath    Suicidal thoughts    Tobacco abuse    Tubular adenoma of colon     Medications:  Scheduled:   atorvastatin  20 mg Oral QHS   benztropine  0.5 mg Oral BID   busPIRone  10 mg Oral TID   divalproex  500 mg Oral BID   enoxaparin (LOVENOX) injection  40 mg Subcutaneous Daily   insulin aspart  0-20 Units Subcutaneous TID WC    ipratropium-albuterol  3 mL Nebulization Q4H   magnesium oxide  400 mg Oral Daily   predniSONE  40 mg Oral Daily   QUEtiapine  25-50 mg Oral TID   sertraline  200 mg Oral Daily   umeclidinium bromide  1 puff Inhalation Daily    Assessment: 67 y.o. female with Afib for heparin Goal of Therapy:  Heparin level 0.3-0.7 units/ml Monitor platelets by anticoagulation protocol: Yes   Plan:  D/C Lovenox Heparin 4000 units IV bolus, then start heparin 1300 units/hr Check heparin level in 6 hours.   Caryl Pina 06/05/2022,5:17 AM

## 2022-06-05 NOTE — Hospital Course (Addendum)
[name] [name] is a 67 y.o.female with a history of COPD on 2L at home, T2DM, bipolar 1 disorder, HTN who was admitted to the [place] at [facility] for COPD exacerbation with superimposed CAP.  * Acute hypoxic respiratory failure ([place]) COPD with superimposed community acquired pneumonia Patient presented with cough and progressive shortness of breath.  Placed on BiPAP (baseline 2L O2 by [place] at home).  CXR showed patchy opacity, possible developing pneumonia.  EKG and troponins were unremarkable, BNP mildly elevated to 436.  Respiratory pathogen panel negative.  Started ceftriaxone 1 g IV daily for 5 days and azithromycin 500 mg IV daily for 3 days. Started on scheduled Xopenex and Duonebs.  Received 1 dose of Solu-Medrol in ED and started on 40 mg prednisone p.o. daily for 4 more days.  Patient switched from BiPAP and placed on 5 L O2 by [place], gradually weaned throughout hospitalization.  Finished antibiotics and steroid course prior to discharge.  Received influenza vaccine inpatient.  Was able to ambulate on 3L of O2 with O2 sats remaining above 90% prior to discharge.  Placed patient on triple acting maintenance inhaler therapy with Incruse Ellipta and Breo Ellipta, showed significant improvement.  At discharge, discontinued patient's home Spiriva inhaler and replaced with Trelegy.  Atrial flutter  HFmrEF Patient presented tachycardic to 120-140 per EKG, initially believed tachycardia was 2/2 illness.  Repeat EKG showed Qtc of 500 and tachycardia persisted.  No significant electrolyte abnormalities.  Cardiology consulted and diagnosed atrial flutter, increased patient's home diltiazem to 90 mg Q6H.  TTE showed 45-50% EF (decreased verus 2021) with severe mitral valve calcification/degeneration.  Cardiology considered TEE guided cardioversion after improvement of respiratory status, but opted to follow up outpatient.  Heparin gtt initially started and then transitioned to Eliquis 5 mg twice daily  to be continued indefinitely.  Transitioned to taking diltiazem 360 mg long acting daily.  Required Lasix 40 mg IV BID after pitting edema noted on exam.  Patient again had sustained tachycardia in 150s and remained in atrial flutter, requiring p.o. metoprolol 25 mg twice daily for 1 day.  HR remained elevated and TEE guided cardioversion was performed with successful conversion to NSR.  Discharged on diltiazem 360 mg PO daily, Lasix 40 mg PO daily, Eliquis 5 mg daily, metoprolol '25mg'$  BID.  Urinary retention Patient complained of suprapubic pain/fullness on admission and was noted to have 400 cc on bladder scan. Patient was I/O cathed x1 and received bladder scans every 6 hours for 2 days.  Continued to spontaneously void and did not require any further I&O caths.  Bladder scan volumes consistently remained less than 200 mL.   Other chronic conditions were medically managed with home medications and formulary alternatives as necessary (chronic pain, T2DM, HTN, bipolar disorder, anxiety, depression)  Issues for follow up Consider referral to pulmonology due to advanced pulmonary disease Follow up on new Trelegy compliance, which replaced Spiriva at discharge Repeat echo in 3 months to review EF changes on rate control per cardiology Stopped trazodone (added melatonin), tizanidine, and amitriptyline given anticholinergic and sedating effects; recommend increasing anxiety meds versus adding sedation Consider switching sertraline to mirtazapine given better effect profile for chronic pain and sleep difficulties Follow up with psych to discuss discontinuing benztropine and/or switching quetiapine to avoid EPS; concern for anticholinergic effects of benztropine Patient declined home health PT services, received DME rolling walker and shower chair, bedside commode unaffordable

## 2022-06-05 NOTE — Progress Notes (Addendum)
Rounding Note    Patient Name: [name] [name] Date of Encounter: 06/05/2022  [facility] Cardiologist: Carlyle Dolly, MD   Subjective   Complains of dyspnea and cough; no CP or palpitations  Inpatient Medications    Scheduled Meds:  atorvastatin  20 mg Oral QHS   benztropine  0.5 mg Oral BID   busPIRone  10 mg Oral TID   diltiazem  60 mg Oral Q6H   divalproex  500 mg Oral BID   insulin aspart  0-20 Units Subcutaneous TID WC   ipratropium-albuterol  3 mL Nebulization Q4H   levalbuterol       levalbuterol       magnesium oxide  400 mg Oral Daily   predniSONE  40 mg Oral Daily   QUEtiapine  25-50 mg Oral TID   sertraline  200 mg Oral Daily   umeclidinium bromide  1 puff Inhalation Daily   Continuous Infusions:  azithromycin Stopped (06/05/22 0013)   cefTRIAXone (ROCEPHIN)  IV Stopped (06/04/22 2135)   heparin 1,300 Units/hr (06/05/22 0645)   PRN Meds: acetaminophen **OR** acetaminophen, HYDROcodone-acetaminophen, levalbuterol, levalbuterol, levalbuterol, nicotine   Vital Signs    Vitals:   06/04/22 2252 06/04/22 2312 06/05/22 0018 06/05/22 0422  BP: 133/79 133/79  114/86  Pulse: (!) 132  (!) 128 (!) 125  Resp: '20  18 20  '$ Temp: 97.6 F (36.4 C)   98 F (36.7 C)  TempSrc: Oral   Oral  SpO2: 94%   93%  Weight:      Height:        Intake/Output Summary (Last 24 hours) at 06/05/2022 0745 Last data filed at 06/05/2022 0645 Gross per 24 hour  Intake 399.84 ml  Output 600 ml  Net -200.16 ml      06/03/2022    7:33 PM 02/22/2020    8:17 PM 02/22/2020    6:48 AM  Last 3 Weights  Weight (lbs) 244 lb 195 lb 1.7 oz 198 lb 13.7 oz  Weight (kg) 110.678 kg 88.5 kg 90.2 kg      Telemetry    Atrial flutter with RVR - Personally Reviewed  ECG    Atrial flutter - Personally Reviewed  Physical Exam   GEN: No acute distress.   Neck: No JVD Cardiac: tachycardic Respiratory: Diffuse exp wheeze GI: Soft, nontender, non-distended  MS: Trace  edema Neuro:  Nonfocal  Psych: Normal affect   Labs    High Sensitivity Troponin:   Recent Labs  Lab 06/03/22 1925 06/03/22 2125 06/04/22 1937 06/04/22 2141  TROPONINIHS '10 9 9 10     '$ Chemistry Recent Labs  Lab 06/03/22 1925 06/04/22 0530 06/04/22 0549 06/05/22 0150  NA 138 137 139 138  K 3.6 4.4 4.5 4.7  CL 96* 94*  --  96*  CO2 31 34*  --  32  GLUCOSE 176* 215*  --  153*  BUN 12 13  --  27*  CREATININE 0.64 0.65  --  0.73  CALCIUM 9.3 8.8*  --  8.6*  MG  --  2.0  --  2.2  PROT  --  6.6  --  6.4*  ALBUMIN  --  3.4*  --  3.4*  AST  --  14*  --  13*  ALT  --  12  --  11  ALKPHOS  --  65  --  56  BILITOT  --  0.4  --  0.2*  GFRNONAA >60 >60  --  >60  ANIONGAP 11  9  --  10    Hematology Recent Labs  Lab 06/03/22 1925 06/04/22 0530 06/04/22 0549 06/05/22 0150  WBC 5.9 5.1  --  7.3  RBC 3.87 3.68*  --  3.56*  HGB 10.4* 10.0* 10.5* 9.4*  HCT 33.7* 32.4* 31.0* 31.5*  MCV 87.1 88.0  --  88.5  MCH 26.9 27.2  --  26.4  MCHC 30.9 30.9  --  29.8*  RDW 15.1 14.9  --  15.6*  PLT 163 147*  --  149*   Thyroid  Recent Labs  Lab 06/04/22 0530  TSH 0.873    BNP Recent Labs  Lab 06/04/22 1916  BNP 435.8*     Radiology    DG Chest 2 View  Result Date: 06/04/2022 CLINICAL DATA:  Shortness of breath.  Pneumonia. EXAM: CHEST - 2 VIEW COMPARISON:  06/03/2022 FINDINGS: Mild cardiomegaly stable. Mild focal opacity is again seen in the right lower lung adjacent to the right heart border, suspicious for mild infiltrate. Evidence of pneumothorax or pleural effusion. Pulmonary hyperinflation and coarsening of interstitial lung markings is unchanged. Old left rib fracture deformities again noted. IMPRESSION: Mild focal opacity in right lower lung adjacent to the heart border, suspicious for mild infiltrate. COPD. Electronically Signed   By: Marlaine Hind M.D.   On: 06/04/2022 10:19   DG Chest Portable 1 View  Result Date: 06/03/2022 CLINICAL DATA:  Dyspnea, COPD  exacerbation EXAM: PORTABLE CHEST 1 VIEW COMPARISON:  10/23/2020 chest radiograph. FINDINGS: Stable cardiomediastinal silhouette with borderline mild cardiomegaly. No pneumothorax. No pleural effusion. Patchy opacity obscures the right heart border, new. Healed left mid rib deformity. No overt pulmonary edema. IMPRESSION: Patchy opacity obscures the right heart border, new, cannot exclude pneumonia. Recommend follow-up PA and lateral chest radiographs to resolution. Electronically Signed   By: Ilona Sorrel M.D.   On: 06/03/2022 19:44      Patient Profile     67 y.o. female with past medical history of home O2 dependent COPD, diabetes mellitus, bipolar disorder, hypertension, atrial fibrillation for evaluation of atrial flutter in the setting of COPD flare/upper respiratory infection.  Last echocardiogram 2021 showed normal LV function.  Assessment & Plan    1 atrial flutter-heart rate remains elevated this morning.  Given this is atrial flutter rate may be difficult to control.  Will increase Cardizem to 90 mg every 6 hours (blood pressure soft so we will need to follow closely).  Discontinue IV heparin and treat with apixaban 5 mg twice daily.  Await echocardiogram for LV function.  Patient will likely need TEE guided cardioversion prior to discharge but would like for her to improve from COPD standpoint first.  2 COPD/upper respiratory infection-continue antibiotics and pulmonary toilet per primary service.  Additional 30 minutes spent with patient and chart this a.m. CRITICAL CARE Performed by: Kirk Ruths   Total critical care time: 30 minutes  Critical care time was exclusive of separately billable procedures and treating other patients.  Critical care was necessary to treat or prevent imminent or life-threatening deterioration.  Critical care was time spent personally by me on the following activities: development of treatment plan with patient and/or surrogate as well as nursing,  discussions with consultants, evaluation of patient's response to treatment, examination of patient, obtaining history from patient or surrogate, ordering and performing treatments and interventions, ordering and review of laboratory studies, ordering and review of radiographic studies, pulse oximetry and re-evaluation of patient's condition.   For questions or updates, please contact [facility] Please consult [email or web address] for contact info under        Signed, Kirk Ruths, MD  06/05/2022, 7:45 AM

## 2022-06-05 NOTE — Progress Notes (Signed)
Daily Progress Note Intern Pager: [phone]  Patient name: [name] [name] Medical record number: [id number] Date of birth: 1956/03/25 Age: 67 y.o. Gender: female  Primary Care Provider: [place] Consultants: Cardiology Code Status: Full  Pt Overview and Major Events to Date:  1/12-admitted 1/14-found to be in a flutter  Assessment and Plan: [name] [name] is a 67 y.o. female presenting with 3 day hx of chest pain and progressive SOB, found to be in COPD exacerbation with CAP. PMH significant for COPD on 2L, T2DM, bipolar 1 disorder, HTN    * Acute hypoxic respiratory failure ([place]) Treating for for COPD exacerbation and CAP. Pt currently on 4 L [place] with some work of breathing and oxygen saturation in low 90s -Xopenex every 6 hours scheduled - Duonebs every 6 hours as needed - cont CTX 1g IV daily (3/5) - cont azithromycin '500mg'$  IV daily (3/3) - prednisone '40mg'$  PO daily for 4 doses (2/4). S/p solumedrol w/ EMS.  - Tylenol prn for pain - RT consulted -Continue Incruse Ellipta daily (home med is Spiriva)  Atrial flutter ([place]) Patient is in a flutter with RVR, was placed on heparin drip earlier this morning. Cardiology consulted with plans to increase Cardizem, switch to oral anticoagulant and plan for likely TEE guided cardioversion after improvement of respiratory status -f/u echo -Cardizem 90 mg every 6 hours -DC heparin gtt., start apixaban 5 mg twice daily -Cardiology consulted, appreciate recs  Urinary retention  Patient had I/O cath yesterday but had spontaneous urination overnight of 500 cc -Bladder scan q6h -Consider I/O cath for bladder scan over 200 cc -If I/O x 3, will start foley catheter  Non-insulin dependent type 2 diabetes mellitus ([place]) Patient w/ DM2, not on insulin. A1c 6.7 06/03/22.  Required 7 units aspart yesterday.  Glucose 159 this a.m. -CBG check AC & QHS -mSSI -Carb modified diet  Chronic pain Has chronic knee and hip pain  due to OA.  Home medication includes Norco 3 times daily as needed which patient states she takes 3 times daily regularly. -Continue home Norco 3 times daily    Of note, patient is on multiple psychiatric medications including amitriptyline, benztropine, BuSpar, Depakote, Seroquel, Zoloft, trazodone all of which have been continued with exception of trazodone and amitriptyline has been prescribed at decreased dose   FEN/GI: Carb modified PPx: Heparin Dispo: Pending continued medical management  Subjective:  Patient states shortness of breath had initially improved since admission but is worse this morning.  Denies any feelings of palpitations or feeling like her heart is racing.  Objective: Temp:  [97.6 F (36.4 C)-98.8 F (37.1 C)] 98.5 F (36.9 C) (01/14 0740) Pulse Rate:  [106-149] 149 (01/14 1216) Resp:  [18-25] 20 (01/14 0740) BP: (102-133)/(69-99) 105/74 (01/14 1139) SpO2:  [92 %-95 %] 93 % (01/14 1216) Physical Exam: General: 67 year old female, lying in bed, NAD Cardiovascular: Tachycardic, regular rhythm Respiratory: Rhonchorous breath sounds throughout, unable to speak in complete sentences   Laboratory: Most recent CBC Lab Results  Component Value Date   WBC 7.3 06/05/2022   HGB 9.4 (L) 06/05/2022   HCT 31.5 (L) 06/05/2022   MCV 88.5 06/05/2022   PLT 149 (L) 06/05/2022   Most recent BMP    Latest Ref Rng & Units 06/05/2022    1:50 AM  BMP  Glucose 70 - 99 mg/dL 153   BUN 8 - 23 mg/dL 27   Creatinine 0.44 - 1.00 mg/dL 0.73   Sodium 135 - 145 mmol/L 138  Potassium 3.5 - 5.1 mmol/L 4.7   Chloride 98 - 111 mmol/L 96   CO2 22 - 32 mmol/L 32   Calcium 8.9 - 10.3 mg/dL 8.6      [name] Gilding, DO 06/05/2022, 12:52 PM  PGY-2, [place] Intern pager: [phone], text pages welcome Secure chat group [place]

## 2022-06-06 ENCOUNTER — Other Ambulatory Visit (HOSPITAL_COMMUNITY): Payer: Self-pay

## 2022-06-06 DIAGNOSIS — I483 Typical atrial flutter: Secondary | ICD-10-CM | POA: Diagnosis not present

## 2022-06-06 DIAGNOSIS — I5021 Acute systolic (congestive) heart failure: Secondary | ICD-10-CM | POA: Diagnosis not present

## 2022-06-06 DIAGNOSIS — J9601 Acute respiratory failure with hypoxia: Secondary | ICD-10-CM | POA: Diagnosis not present

## 2022-06-06 LAB — CBC
HCT: 31.1 % — ABNORMAL LOW (ref 36.0–46.0)
Hemoglobin: 9.2 g/dL — ABNORMAL LOW (ref 12.0–15.0)
MCH: 27 pg (ref 26.0–34.0)
MCHC: 29.6 g/dL — ABNORMAL LOW (ref 30.0–36.0)
MCV: 91.2 fL (ref 80.0–100.0)
Platelets: 137 10*3/uL — ABNORMAL LOW (ref 150–400)
RBC: 3.41 MIL/uL — ABNORMAL LOW (ref 3.87–5.11)
RDW: 15.6 % — ABNORMAL HIGH (ref 11.5–15.5)
WBC: 6.2 10*3/uL (ref 4.0–10.5)
nRBC: 0 % (ref 0.0–0.2)

## 2022-06-06 LAB — BASIC METABOLIC PANEL
Anion gap: 8 (ref 5–15)
BUN: 28 mg/dL — ABNORMAL HIGH (ref 8–23)
CO2: 36 mmol/L — ABNORMAL HIGH (ref 22–32)
Calcium: 9 mg/dL (ref 8.9–10.3)
Chloride: 97 mmol/L — ABNORMAL LOW (ref 98–111)
Creatinine, Ser: 0.62 mg/dL (ref 0.44–1.00)
GFR, Estimated: >60 mL/min (ref >60)
Glucose, Bld: 144 mg/dL — ABNORMAL HIGH (ref 70–99)
Potassium: 4.7 mmol/L (ref 3.5–5.1)
Sodium: 141 mmol/L (ref 135–145)

## 2022-06-06 LAB — GLUCOSE, CAPILLARY
Glucose-Capillary: 127 mg/dL — ABNORMAL HIGH (ref 70–99)
Glucose-Capillary: 164 mg/dL — ABNORMAL HIGH (ref 70–99)
Glucose-Capillary: 270 mg/dL — ABNORMAL HIGH (ref 70–99)
Glucose-Capillary: 162 mg/dL — ABNORMAL HIGH (ref 70–99)

## 2022-06-06 MED ORDER — LEVALBUTEROL HCL 1.25 MG/0.5ML IN NEBU
1.2500 mg | INHALATION_SOLUTION | Freq: Two times a day (BID) | RESPIRATORY_TRACT | Status: DC
  Administered 2022-06-06 – 2022-06-10 (×7): 1.25 mg via RESPIRATORY_TRACT
  Filled 2022-06-06 (×11): qty 0.5

## 2022-06-06 NOTE — Progress Notes (Addendum)
Daily Progress Note Intern Pager: [phone]  Patient name: [name] [name] Medical record number: [id number] Date of birth: 26-Jan-1956 Age: 67 y.o. Gender: female  Primary Care Provider: [facility] [place] Consultants: Cardiology Code Status: Full  Pt Overview and Major Events to Date:  1/12 - Admitted with COPD exacerbation 1/14 - Found to be in A-flutter  Assessment and Plan: [name] [name] is a 67 y.o. female presenting with 3 day hx of chest pain and progressive SOB, found to be in COPD exacerbation with CAP.  Pertinent PMH/PSH includes COPD on (?)3L at home, T2DM, bipolar 1 disorder, HTN.  * Acute hypoxic respiratory failure ([place]) Treating for for COPD exacerbation and CAP.  Patient remains on 4 L [place] with some work of breathing and oxygen saturation in low 90s. - Xopenex (levabuterol) Q6H scheduled - Duonebs Q6H as needed - CTX 1g IV daily (day 4 of 5) - s/p azithromycin '500mg'$  IV daily for 3 days - Prednisone '40mg'$  PO daily for 4 doses (day 3 of 4). S/p solumedrol w/ EMS.  - Tylenol prn for pain - RT consulted - Continue Incruse Ellipta daily (home med is Spiriva, price checked)  Atrial flutter ([place]) Patient is still in A-flutter with RVR, was placed on heparin drip yesterday and now transitioned to oral diltiazem only. HR has decreased since yesterday.  Cardiology consulted and will consider TEE guided cardioversion after improvement of respiratory status. - Cardiology consulted, appreciate recs - Cardizem 90 mg every 6 hours; convert to long-acting formulation tomorrow - Continue apixaban 5 mg twice daily - Poor candidate for amiodarone d/t respiratory disease and current respiratory status  Urinary retention Patient had I/O cath on day of admission but has had multiple episodes of spontaneous urination.  This morning, bladder scan showed 165 mL retained. - Bladder scan q6h - Consider I/O cath for bladder scan over 200 cc - If I/O x 3, will start foley  catheter  Non-insulin dependent type 2 diabetes mellitus ([place]) Patient w/ DM2, not on insulin. A1c 6.7 06/03/22.  Required 11 units aspart in past 24 hours.  Glucose 127 this AM and ranges in 100s, stable. - CBG check ACHS - mSSI - Carb modified diet  Chronic pain Has chronic knee and hip pain due to OA.  Home medication includes Norco 3 times daily as needed which patient states she takes 3 times daily regularly. - Continue home Norco 3 times daily  FEN/GI: Carb modified PPx: Heparin Dispo:Pending PT recommendations . Barriers include a-flutter and respiratory status.   Subjective:  She reports breathing is better, continues to be short of breath with exertion.  Denies chest pain, abdominal pain, nausea/vomiting.  Is hungry this morning and is about to eat.  Patient reports that she is able to stand and move around pretty well relative to her baseline.  Daughter is at bedside and expresses concern about the patient's upcoming likely synchronized cardioversion given patient's past intolerance of anesthesia and ventilation.  Deferred information about cardioversion to cardiology recommendations.  Objective: Temp:  [97.6 F (36.4 C)-99 F (37.2 C)] 97.9 F (36.6 C) (01/15 0747) Pulse Rate:  [74-149] 75 (01/15 0747) Resp:  [16-22] 20 (01/15 0747) BP: (98-145)/(64-92) 117/70 (01/15 1158) SpO2:  [91 %-97 %] 96 % (01/15 0747)  Physical Exam: General: Age-appropriate, resting comfortably in chair, NAD, WNWD, alert and at baseline. HEENT: MMM. Cardiovascular: Tachycardic (100-110 bpm). Aflutter on EKG. Normal S1/S2. No murmurs, rubs, or gallops appreciated. 2+ radial pulses. Pulmonary: Significant end expiratory wheezes in  all lung fields. Normal WOB on 3 L O2. No wheezes, rales, or crackles. Abdominal: Normoactive bowel sounds. No tenderness to deep or light palpation. No rebound or guarding. No HSM. Skin: Warm and dry. No rashes grossly. Extremities: No peripheral edema  bilaterally. MSK: Able to stand up and ambulate a few steps unassisted.  Laboratory: Most recent CBC Lab Results  Component Value Date   WBC 6.2 06/06/2022   HGB 9.2 (L) 06/06/2022   HCT 31.1 (L) 06/06/2022   MCV 91.2 06/06/2022   PLT 137 (L) 06/06/2022   Most recent BMP    Latest Ref Rng & Units 06/06/2022    2:15 AM  BMP  Glucose 70 - 99 mg/dL 144   BUN 8 - 23 mg/dL 28   Creatinine 0.44 - 1.00 mg/dL 0.62   Sodium 135 - 145 mmol/L 141   Potassium 3.5 - 5.1 mmol/L 4.7   Chloride 98 - 111 mmol/L 97   CO2 22 - 32 mmol/L 36   Calcium 8.9 - 10.3 mg/dL 9.0    CBG (last 3)  Recent Labs    06/05/22 2141 06/06/22 0746 06/06/22 1129  GLUCAP 190* 127* 164*    Imaging/Diagnostic Tests: - CXR 1/14: No convincing acute cardiopulmonary abnormality, improved from 1/12 and 1/13. - Echo 1/14: Compared to prior study in 2021, the LVEF has dropped to 45-50% from 55-60% in the setting of Afib with RVR.  The left ventricle has mildly decreased function. The left ventricle demonstrates global hypokinesis. There is mild concentric left ventricular hypertrophy. The mitral valve is degenerative. Severe mitral annular calcification.  Shitarev, Dimitry, Medical Student 06/06/2022, 12:10 PM  MS4, [employer] SOM, [place] Intern pager: [phone], text pages welcome Secure chat group [place]  '  I was personally present and performed or re-performed the history, physical exam and medical decision making activities of this service and have verified that the service and findings are accurately documented in the student's note.  Shary Key, DO                  06/06/2022, 1:14 PM

## 2022-06-06 NOTE — TOC Benefit Eligibility Note (Addendum)
Patient Teacher, English as a foreign language completed.    The patient is currently admitted and upon discharge could be taking Eliquis 5 mg.  The current 30 day co-pay is $4.60.   The patient is currently admitted and upon discharge could be taking Xarelto 20 mg.  The current 30 day co-pay is $4.60.   The patient is currently admitted and upon discharge could be taking tiotropium (Spiriva) 18 mcg inh.  The current 30 day co-pay is $1.55.   The patient is currently admitted and upon discharge could be taking Incruse Ellipta .  Non Formulary  The patient is insured through [employer] Medicare Part D   Lyndel Safe, [place] Patient Advocate Specialist [place] Patient Advocate Team Direct Number: [phone]  Fax: [phone]

## 2022-06-06 NOTE — Progress Notes (Addendum)
Rounding Note    Patient Name: [name] [name] Date of Encounter: 06/06/2022  [facility] Cardiologist: Carlyle Dolly, MD   Subjective   Patient sitting on the side of the bed eating breakfast. Denies chest pain. Breathing has improved but is still "heavy". Continues to have cough, audible wheezing. Breathing not yet back to baseline   Inpatient Medications    Scheduled Meds:  apixaban  5 mg Oral BID   atorvastatin  20 mg Oral QHS   benztropine  0.5 mg Oral BID   busPIRone  10 mg Oral TID   diltiazem  90 mg Oral Q6H   divalproex  500 mg Oral BID   HYDROcodone-acetaminophen  1 tablet Oral TID   influenza vaccine adjuvanted  0.5 mL Intramuscular Tomorrow-1000   insulin aspart  0-20 Units Subcutaneous TID WC   levalbuterol  1.25 mg Nebulization Q6H   magnesium oxide  400 mg Oral Daily   polyethylene glycol  17 g Oral Daily   predniSONE  40 mg Oral Daily   QUEtiapine  25 mg Oral BID   And   QUEtiapine  50 mg Oral QHS   senna-docusate  1 tablet Oral QHS   sertraline  200 mg Oral Daily   umeclidinium bromide  1 puff Inhalation Daily   Continuous Infusions:  cefTRIAXone (ROCEPHIN)  IV 1 g (06/05/22 2242)   PRN Meds: acetaminophen **OR** acetaminophen, ipratropium-albuterol, nicotine   Vital Signs    Vitals:   06/06/22 0221 06/06/22 0318 06/06/22 0652 06/06/22 0747  BP:  98/64 108/78 106/71  Pulse:  74  75  Resp:  19  20  Temp:  97.6 F (36.4 C)  97.9 F (36.6 C)  TempSrc:  Oral  Oral  SpO2: 95% 94%  96%  Weight:      Height:        Intake/Output Summary (Last 24 hours) at 06/06/2022 0831 Last data filed at 06/05/2022 1745 Gross per 24 hour  Intake 480 ml  Output 575 ml  Net -95 ml      06/03/2022    7:33 PM 02/22/2020    8:17 PM 02/22/2020    6:48 AM  Last 3 Weights  Weight (lbs) 244 lb 195 lb 1.7 oz 198 lb 13.7 oz  Weight (kg) 110.678 kg 88.5 kg 90.2 kg      Telemetry    Atrial flutter, HR in the 70s overnight and now in the 90s-100s  - Personally Reviewed  ECG    No new tracings since 1/13 - Personally Reviewed  Physical Exam   GEN: No acute distress.  Sitting upright on the edge of the bed eating breakfast  Neck: No JVD Cardiac: Irregular rate and rhythm, no murmurs, rubs, or gallops. Radial pulses 2+ bilaterally  Respiratory: Expiratory wheezes heard throughout lungs  GI: Soft, nontender, non-distended  MS: Trace edema in BLE, R>L; No deformity. Neuro:  Nonfocal  Psych: Normal affect   Labs    High Sensitivity Troponin:   Recent Labs  Lab 06/03/22 1925 06/03/22 2125 06/04/22 1937 06/04/22 2141  TROPONINIHS '10 9 9 10     '$ Chemistry Recent Labs  Lab 06/04/22 0530 06/04/22 0549 06/05/22 0150 06/06/22 0215  NA 137 139 138 141  K 4.4 4.5 4.7 4.7  CL 94*  --  96* 97*  CO2 34*  --  32 36*  GLUCOSE 215*  --  153* 144*  BUN 13  --  27* 28*  CREATININE 0.65  --  0.73 0.62  CALCIUM 8.8*  --  8.6* 9.0  MG 2.0  --  2.2  --   PROT 6.6  --  6.4*  --   ALBUMIN 3.4*  --  3.4*  --   AST 14*  --  13*  --   ALT 12  --  11  --   ALKPHOS 65  --  56  --   BILITOT 0.4  --  0.2*  --   GFRNONAA >60  --  >60 >60  ANIONGAP 9  --  10 8    Lipids No results for input(s): "CHOL", "TRIG", "HDL", "LABVLDL", "LDLCALC", "CHOLHDL" in the last 168 hours.  Hematology Recent Labs  Lab 06/04/22 0530 06/04/22 0549 06/05/22 0150 06/06/22 0215  WBC 5.1  --  7.3 6.2  RBC 3.68*  --  3.56* 3.41*  HGB 10.0* 10.5* 9.4* 9.2*  HCT 32.4* 31.0* 31.5* 31.1*  MCV 88.0  --  88.5 91.2  MCH 27.2  --  26.4 27.0  MCHC 30.9  --  29.8* 29.6*  RDW 14.9  --  15.6* 15.6*  PLT 147*  --  149* 137*   Thyroid  Recent Labs  Lab 06/04/22 0530  TSH 0.873    BNP Recent Labs  Lab 06/04/22 1916  BNP 435.8*    DDimer No results for input(s): "DDIMER" in the last 168 hours.   Radiology    ECHOCARDIOGRAM LIMITED  Result Date: 06/05/2022    ECHOCARDIOGRAM REPORT   Patient Name:   Tylar Merendino Date of Exam: 06/05/2022 Medical Rec  #:  [id number]         Height:       65.0 in Accession #:    [id number]        Weight:       244.0 lb Date of Birth:  [birth year]         BSA:          2.153 m Patient Age:    37 years          BP:           105/74 mmHg Patient Gender: F                 HR:           150 bpm. Exam Location:  Inpatient Procedure: Limited Echo and Intracardiac Opacification Agent Indications:    I48.92* Unspecified atrial flutter  History:        Patient has prior history of Echocardiogram examinations, most                 recent 01/29/2020. COPD, Arrythmias:Atrial Flutter; Risk                 Factors:Hypertension, Diabetes and Dyslipidemia.  Sonographer:    Raquel Sarna Senior RDCS Referring Phys: Erskine Emery  Sonographer Comments: Limited to assess for EF prior to cardioversion. IMPRESSIONS  1. Limited echo to assess LVEF.  2. Left ventricular ejection fraction, by estimation, is 45 to 50%. The left ventricle has mildly decreased function. The left ventricle demonstrates global hypokinesis. There is mild concentric left ventricular hypertrophy.  3. The mitral valve is degenerative. Severe mitral annular calcification.  4. Patient is in Afib with RVR during study Comparison(s): Compared to prior study in 2021, the LVEF has dropped to 45-50% from 55-60% in the setting of Afib with RVR. FINDINGS  Left Ventricle: Left ventricular ejection fraction, by estimation, is 45 to 50%. The left ventricle has mildly decreased function. The  left ventricle demonstrates global hypokinesis. Definity contrast agent was given IV to delineate the left ventricular  endocardial borders. The left ventricular internal cavity size was normal in size. There is mild concentric left ventricular hypertrophy. Mitral Valve: The mitral valve is degenerative in appearance. There is moderate thickening of the mitral valve leaflet(s). There is moderate calcification of the mitral valve leaflet(s). Severe mitral annular calcification. Gwyndolyn Kaufman MD Electronically  signed by Gwyndolyn Kaufman MD Signature Date/Time: 06/05/2022/12:14:47 PM    Final    DG CHEST PORT 1 VIEW  Result Date: 06/05/2022 CLINICAL DATA:  67 year old female with shortness of breath. EXAM: PORTABLE CHEST 1 VIEW COMPARISON:  Chest radiographs 06/04/2022 and earlier. FINDINGS: Portable AP semi upright view at 0756 hours. Cardiomegaly, new from a 2017 chest CT. Stable cardiac size and mediastinal contours. Relatively large lung volumes. Mild chronic appearing increased interstitial markings. But improved perihilar ventilation from the previous exams. Probable mild lung scarring along the right heart border. No pneumothorax, pleural effusion, or confluent pulmonary opacity. Visualized tracheal air column is within normal limits. No acute osseous abnormality identified. Paucity of bowel gas the visible abdomen. IMPRESSION: 1. Cardiomegaly. Suspicion of chronic hyperinflation and lung scarring along the right heart border. 2. No convincing acute cardiopulmonary abnormality. Electronically Signed   By: Genevie Ann M.D.   On: 06/05/2022 08:08   DG Chest 2 View  Result Date: 06/04/2022 CLINICAL DATA:  Shortness of breath.  Pneumonia. EXAM: CHEST - 2 VIEW COMPARISON:  06/03/2022 FINDINGS: Mild cardiomegaly stable. Mild focal opacity is again seen in the right lower lung adjacent to the right heart border, suspicious for mild infiltrate. Evidence of pneumothorax or pleural effusion. Pulmonary hyperinflation and coarsening of interstitial lung markings is unchanged. Old left rib fracture deformities again noted. IMPRESSION: Mild focal opacity in right lower lung adjacent to the heart border, suspicious for mild infiltrate. COPD. Electronically Signed   By: Marlaine Hind M.D.   On: 06/04/2022 10:19    Cardiac Studies   Echocardiogram (limited) 06/05/22  1. Limited echo to assess LVEF.   2. Left ventricular ejection fraction, by estimation, is 45 to 50%. The  left ventricle has mildly decreased function. The  left ventricle  demonstrates global hypokinesis. There is mild concentric left ventricular  hypertrophy.   3. The mitral valve is degenerative. Severe mitral annular calcification.   4. Patient is in Afib with RVR during study   Patient Profile     67 y.o. female  with past medical history of home O2 dependent COPD, diabetes mellitus, bipolar disorder, hypertension, atrial fibrillation for evaluation of atrial flutter in the setting of COPD flare/upper respiratory infection.  Last echocardiogram 2021 showed normal LV function.   Assessment & Plan    Paroxysmal Atrial Flutter - Patient presented to the ED on 1/12 complaining of SOB, cough. Found to be in atrial flutter, HR elevated to the 150s.  - Echocardiogram showed EF 45-50% (down from 55-60% in 2021). Suspect this is tachy induced CM in the setting of aflutter with RVR  - Continue eliquis 5 mg BID  - Patient now on diltiazem 90 mg q6 hrs  - Per telemetry, HR was in the 70s overnight, now in the 90s-low 100s - Suspect that flutter is driven by respiratory disease. Continue to follow HR as breathing improves. If HR remains elevated after COPD exacerbation is resolved, could consider TEE guided DCCV. daughter reports that patient has been told she cannot go under anesthesia due to advanced COPD.  -  Given mildly reduced EF, ideally patient would not be on Cardizem. However, given patient's advanced COPD/lung disease, I am hesitant to start beta blocker or amiodarone. Maybe could use metoprolol. Patient is tolerating Cardizem well and BP is stable, EF only mildly reduced, OK to continue for now  Mitral Valve Calcification  - Limited echo this admission showed that the mitral valve was degenerative, severe mitral annular calcification - Previous echo in 2021 showed moderate mitral annular calcification, trivial MR - Consider repeat limited echo this admission vs outpatient follow up. Will discuss with MD   Otherwise per primary  - COPD  exacerbation, CAP  - Urinary Retention  - Type 2 DM  - Chronic Pain   For questions or updates, please contact [place] Please consult [email or web address] for contact info under        Signed, Margie Billet, PA-C  06/06/2022, 8:31 AM    Patient seen and examined and agree with Vikki Ports, PA-C as detailed above.   In brief, the patient is a 67 year old female with severe COPD on home O2, DMII, bipolar disorder, HTN, and Afib who presented with acute on chronic COPD exacerbation and CAP with course complicated by Afib with RVR with mildly depressed EF on TTE for which Cardiology was consulted.  Today, patient remains in [place] but HR improved to 70s on PO dilt. Not pursuing amiodarone or BB at this time due to severe COPD with active wheezing. EF 45-50% on TTE with global hypokinesis. Will continue with PO dilt for now and apixaban for [facility] with plans to change to long-acting tomorrow as able. Patient states she does not feel well with the Afib despite improvement in HR. Will need to improve from a respiratory status prior to consideration of TEE/DCCV. Of note, she has been told in the past not to undergo anesthesia given the severity of COPD. Will continue to monitor her respiratory status closely and ultimately, may need to defer procedure to outpatient setting.  GEN: No acute distress.   Neck: No JVD Cardiac: Irregular, no murmurs  Respiratory: Diffusely wheezing GI: Soft, nontender, non-distended  MS: No edema; No deformity. Neuro:  Nonfocal  Psych: Normal affect    Plan: -Continue dilt '90mg'$  q6h for rate control; convert to long-acting tomorrow -Continue apixaban for [facility] -Not a great amiodarone or BB candidate at this time due to severe COPD with active wheezing -Will need improvement from a respiratory standpoint prior to consideration for TEE/DCCV -Management of COPD and CAP per primary team  Gwyndolyn Kaufman, MD

## 2022-06-07 ENCOUNTER — Other Ambulatory Visit (HOSPITAL_COMMUNITY): Payer: Self-pay

## 2022-06-07 DIAGNOSIS — I482 Chronic atrial fibrillation, unspecified: Secondary | ICD-10-CM

## 2022-06-07 DIAGNOSIS — I4892 Unspecified atrial flutter: Secondary | ICD-10-CM | POA: Diagnosis not present

## 2022-06-07 DIAGNOSIS — J9601 Acute respiratory failure with hypoxia: Secondary | ICD-10-CM | POA: Diagnosis not present

## 2022-06-07 LAB — CBC
HCT: 32.7 % — ABNORMAL LOW (ref 36.0–46.0)
Hemoglobin: 9.9 g/dL — ABNORMAL LOW (ref 12.0–15.0)
MCH: 27 pg (ref 26.0–34.0)
MCHC: 30.3 g/dL (ref 30.0–36.0)
MCV: 89.3 fL (ref 80.0–100.0)
Platelets: 150 10*3/uL (ref 150–400)
RBC: 3.66 MIL/uL — ABNORMAL LOW (ref 3.87–5.11)
RDW: 15.3 % (ref 11.5–15.5)
WBC: 6.3 10*3/uL (ref 4.0–10.5)
nRBC: 0 % (ref 0.0–0.2)

## 2022-06-07 LAB — BASIC METABOLIC PANEL
Anion gap: 8 (ref 5–15)
BUN: 29 mg/dL — ABNORMAL HIGH (ref 8–23)
CO2: 35 mmol/L — ABNORMAL HIGH (ref 22–32)
Calcium: 9.2 mg/dL (ref 8.9–10.3)
Chloride: 98 mmol/L (ref 98–111)
Creatinine, Ser: 0.65 mg/dL (ref 0.44–1.00)
GFR, Estimated: >60 mL/min (ref >60)
Glucose, Bld: 131 mg/dL — ABNORMAL HIGH (ref 70–99)
Potassium: 4.7 mmol/L (ref 3.5–5.1)
Sodium: 141 mmol/L (ref 135–145)

## 2022-06-07 LAB — GLUCOSE, CAPILLARY
Glucose-Capillary: 139 mg/dL — ABNORMAL HIGH (ref 70–99)
Glucose-Capillary: 175 mg/dL — ABNORMAL HIGH (ref 70–99)

## 2022-06-07 MED ORDER — FLUTICASONE FUROATE-VILANTEROL 200-25 MCG/ACT IN AEPB
1.0000 | INHALATION_SPRAY | Freq: Every day | RESPIRATORY_TRACT | Status: DC
  Administered 2022-06-07 – 2022-06-10 (×4): 1 via RESPIRATORY_TRACT
  Filled 2022-06-07: qty 28

## 2022-06-07 MED ORDER — DILTIAZEM HCL ER COATED BEADS 180 MG PO CP24
300.0000 mg | ORAL_CAPSULE | Freq: Every day | ORAL | Status: DC
  Administered 2022-06-07: 300 mg via ORAL
  Filled 2022-06-07: qty 1

## 2022-06-07 MED ORDER — FUROSEMIDE 10 MG/ML IJ SOLN
40.0000 mg | Freq: Two times a day (BID) | INTRAMUSCULAR | Status: DC
  Administered 2022-06-07 – 2022-06-08 (×3): 40 mg via INTRAVENOUS
  Filled 2022-06-07 (×3): qty 4

## 2022-06-07 MED ORDER — FUROSEMIDE 10 MG/ML IJ SOLN: 40.0000 mg | Freq: Every day | INTRAMUSCULAR | Status: DC

## 2022-06-07 MED ORDER — FUROSEMIDE 10 MG/ML IJ SOLN
40.0000 mg | Freq: Once | INTRAMUSCULAR | Status: AC
  Administered 2022-06-07: 40 mg via INTRAVENOUS
  Filled 2022-06-07: qty 4

## 2022-06-07 NOTE — Progress Notes (Addendum)
Daily Progress Note Intern Pager: [phone]  Patient name: [name] [name] Medical record number: [id number] Date of birth: 1956/03/26 Age: 67 y.o. Gender: female  Primary Care Provider: [place] Consultants: Cardiology Code Status: Full  Pt Overview and Major Events to Date:  1/12 - Admitted 1/14 - Found to be in a flutter  Assessment and Plan: [name] [name] is a 67 y.o. female presenting with 3 day hx of chest pain and progressive SOB, found to be in COPD exacerbation with CAP.   Pertinent PMH/PSH includes COPD on (?)3L at home, T2DM, bipolar 1 disorder, HTN.  * Acute hypoxic respiratory failure (HCC) Treating for for COPD exacerbation and CAP, breathing gradually improving per patient.  Patient remains on 4 L [place] with some work of breathing and oxygen saturation in mid-90s.  At home, patient takes Spiriva inhaler, will need triple therapy with Breztri or Trelegy given extent of patient's respiratory disease.  Will counsel patient prior to discharge. - Xopenex (levabuterol) Q6H scheduled - Duonebs Q6H as needed - CTX 1g IV daily (day 5 of 5) - s/p azithromycin '500mg'$  IV daily for 3 days - Prednisone '40mg'$  PO daily for 4 doses (day 4 of 4) - Tylenol prn for pain - RT consulted - Continue Incruse Ellipta daily (LAMA), add Breo Ellipta (ICS+LABA) for triple therapy inpatient  Atrial flutter ([place]) Patient is still in A-flutter on diltiazem, HR ranging from 90-120.  Cardiology onboard, recommendations below.  Patient currently feels well overall, but is able to tell she is in aflutter. - Cardiology consulted, appreciate recs: - Cardizem 90 mg every 6 hours; convert to long-acting formulation today - Continue apixaban 5 mg twice daily - Poor candidate for amiodarone d/t respiratory disease and current respiratory status - Considering cardioversion/TEE pending respiratory improvement, may do outpt -Lasix IV 40 mg BID for edema  Urinary retention Patient had  I/O cath on day of admission but has since had multiple episodes of spontaneous urination.  Bladder scans were difficult to obtain for nursing staff due to possible umbilical hernia.  Have been reordered, but nursing staff can hold if patient urinating Q6 minimum.  Patient continues to spontaneously void per history, without suprapubic TTP or fullness on exam.  Net +158.5 mL output 1/14, no I/O documented yesterday. Will CTM. - Strict I/O, continue to monitor - Bladder scans Q6, can hold if unable to obtain and patient urinating every 6 hours - If I/O x 3, will start foley catheter  Non-insulin dependent type 2 diabetes mellitus ([place]) Patient w/ DM2, not on insulin. A1c 6.7 06/03/22.  Required 18 units aspart in past 24 hours.  Glucose 139 this AM and generally stable, though did spike to 270 yesterday, requiring a bit more short acting insulin than before. Regimen does not require changes at this time, will CTM. - CBG check ACHS - mSSI - Carb modified diet  Chronic pain Has chronic knee and hip pain due to OA.  Home medication includes Norco 3 times daily as needed which patient states she takes 3 times daily regularly. - Continue home Norco 3 times daily  FEN/GI: Carb modified PPx: Heparin Dispo:Pending PT recommendations  and pending clinical improvement . Barriers include a-flutter and respiratory status.   Subjective:  Patient reports continuing gradual improvement with breathing this morning compared to yesterday, does still endorse mild dyspnea.  Enthusiastic about working with PT today, previously was weak and fell after last hospitalization.  Has been coughing and feels phlegm in lungs and throat,  but has been unable to cough it up as of this AM.  Denies chest pain.  States that she has been urinating regularly and does not have any suprapubic pain.  Objective: Temp:  [97.6 F (36.4 C)-98.2 F (36.8 C)] 97.9 F (36.6 C) (01/16 0812) Pulse Rate:  [65-130] 99 (01/16 0812) Resp:   [18-20] 19 (01/16 0812) BP: (124-149)/(65-82) 127/69 (01/16 1226) SpO2:  [91 %-96 %] 93 % (01/16 0812) FiO2 (%):  [40 %] 40 % (01/16 0002)  Physical Exam: General: Sitting up in bed. NAD. Alert and at baseline. HEENT: MMM. Cardiovascular: Tachycardic, irregular rhythm. Normal S1/S2. No murmurs, rubs, or gallops appreciated. 2+ radial pulses. Pulmonary: Quiet breath sounds. Significant end-expiratory wheezing. Periodically coughs with deep inspiration. Mildly increased WOB, no accessory muscle usage on 4L [place]. No crackles. Abdominal: No tenderness to deep or light palpation. No suprapubic fullness or TTP. No rebound or guarding. Skin: Warm and dry. Extremities: 1+ peripheral edema bilaterally at ankle.  Laboratory: Most recent CBC Lab Results  Component Value Date   WBC 6.3 06/07/2022   HGB 9.9 (L) 06/07/2022   HCT 32.7 (L) 06/07/2022   MCV 89.3 06/07/2022   PLT 150 06/07/2022   Most recent BMP    Latest Ref Rng & Units 06/07/2022    2:21 AM  BMP  Glucose 70 - 99 mg/dL 131   BUN 8 - 23 mg/dL 29   Creatinine 0.44 - 1.00 mg/dL 0.65   Sodium 135 - 145 mmol/L 141   Potassium 3.5 - 5.1 mmol/L 4.7   Chloride 98 - 111 mmol/L 98   CO2 22 - 32 mmol/L 35   Calcium 8.9 - 10.3 mg/dL 9.2    CBG (last 3)  Recent Labs    06/06/22 2144 06/07/22 0817 06/07/22 1218  GLUCAP 162* 139* 175*    Imaging/Diagnostic Tests: - None new   Dimitry Shitarev MS4, [facility] SOM, [place] Intern pager: [phone], text pages welcome Secure chat group [place]    I was personally present and re-performed the exam and medical decision making and verified the service and findings are accurately documented in the student's note.  Precious Gilding, DO 06/07/2022 12:35 PM

## 2022-06-07 NOTE — Progress Notes (Signed)
Rounding Note    Patient Name: [name] [name] Date of Encounter: 06/07/2022  [place] Cardiologist: Carlyle Dolly, MD   Subjective   Patient feels like breathing is a bit worse today. Reports increased wheezing and increased cough. Feels like she is trying to cough something up. Denies chest pain or palpitations.   Inpatient Medications    Scheduled Meds:  apixaban  5 mg Oral BID   atorvastatin  20 mg Oral QHS   benztropine  0.5 mg Oral BID   busPIRone  10 mg Oral TID   diltiazem  90 mg Oral Q6H   divalproex  500 mg Oral BID   HYDROcodone-acetaminophen  1 tablet Oral TID   influenza vaccine adjuvanted  0.5 mL Intramuscular Tomorrow-1000   insulin aspart  0-20 Units Subcutaneous TID WC   levalbuterol  1.25 mg Nebulization BID   magnesium oxide  400 mg Oral Daily   polyethylene glycol  17 g Oral Daily   predniSONE  40 mg Oral Daily   QUEtiapine  25 mg Oral BID   And   QUEtiapine  50 mg Oral QHS   senna-docusate  1 tablet Oral QHS   sertraline  200 mg Oral Daily   umeclidinium bromide  1 puff Inhalation Daily   Continuous Infusions:  cefTRIAXone (ROCEPHIN)  IV 1 g (06/06/22 2147)   PRN Meds: acetaminophen **OR** acetaminophen, ipratropium-albuterol, nicotine   Vital Signs    Vitals:   06/06/22 2000 06/06/22 2040 06/07/22 0002 06/07/22 0557  BP: 126/65 124/71 (!) 149/68 129/82  Pulse: 65 (!) 130 94 91  Resp:  '18 18 19  '$ Temp:  97.8 F (36.6 C) 98.2 F (36.8 C) 97.6 F (36.4 C)  TempSrc:   Oral Oral  SpO2: 94% 96% 94% 92%  Weight:      Height:       No intake or output data in the 24 hours ending 06/07/22 0725    06/03/2022    7:33 PM 02/22/2020    8:17 PM 02/22/2020    6:48 AM  Last 3 Weights  Weight (lbs) 244 lb 195 lb 1.7 oz 198 lb 13.7 oz  Weight (kg) 110.678 kg 88.5 kg 90.2 kg      Telemetry    Atrial flutter, HR in the 80s-90s  - Personally Reviewed  ECG    No new tracings since 1/13  - Personally Reviewed  Physical Exam    GEN: No acute distress.  Laying in the bed with head elevated. Wearing [place] Neck: No JVD with head elevated  Cardiac: Irregular rate and rhythm, grade 2/6 systolic murmur at RUSB Respiratory: Crackles in lung bases, expiratory wheezing heard throughout  GI: Soft, nontender, non-distended  MS: Trace edema in BLE; No deformity. Neuro:  Nonfocal  Psych: Normal affect   Labs    High Sensitivity Troponin:   Recent Labs  Lab 06/03/22 1925 06/03/22 2125 06/04/22 1937 06/04/22 2141  TROPONINIHS '10 9 9 10     '$ Chemistry Recent Labs  Lab 06/04/22 0530 06/04/22 0549 06/05/22 0150 06/06/22 0215 06/07/22 0221  NA 137   < > 138 141 141  K 4.4   < > 4.7 4.7 4.7  CL 94*  --  96* 97* 98  CO2 34*  --  32 36* 35*  GLUCOSE 215*  --  153* 144* 131*  BUN 13  --  27* 28* 29*  CREATININE 0.65  --  0.73 0.62 0.65  CALCIUM 8.8*  --  8.6* 9.0 9.2  MG 2.0  --  2.2  --   --   PROT 6.6  --  6.4*  --   --   ALBUMIN 3.4*  --  3.4*  --   --   AST 14*  --  13*  --   --   ALT 12  --  11  --   --   ALKPHOS 65  --  56  --   --   BILITOT 0.4  --  0.2*  --   --   GFRNONAA >60  --  >60 >60 >60  ANIONGAP 9  --  '10 8 8   '$ < > = values in this interval not displayed.    Lipids No results for input(s): "CHOL", "TRIG", "HDL", "LABVLDL", "LDLCALC", "CHOLHDL" in the last 168 hours.  Hematology Recent Labs  Lab 06/05/22 0150 06/06/22 0215 06/07/22 0221  WBC 7.3 6.2 6.3  RBC 3.56* 3.41* 3.66*  HGB 9.4* 9.2* 9.9*  HCT 31.5* 31.1* 32.7*  MCV 88.5 91.2 89.3  MCH 26.4 27.0 27.0  MCHC 29.8* 29.6* 30.3  RDW 15.6* 15.6* 15.3  PLT 149* 137* 150   Thyroid  Recent Labs  Lab 06/04/22 0530  TSH 0.873    BNP Recent Labs  Lab 06/04/22 1916  BNP 435.8*    DDimer No results for input(s): "DDIMER" in the last 168 hours.   Radiology    ECHOCARDIOGRAM LIMITED  Result Date: 06/05/2022    ECHOCARDIOGRAM REPORT   Patient Name:   [name] [name] Date of Exam: 06/05/2022 Medical Rec #:  [id number]          Height:       65.0 in Accession #:    [id number]        Weight:       244.0 lb Date of Birth:  12/04/1955         BSA:          2.153 m Patient Age:    5 years          BP:           105/74 mmHg Patient Gender: F                 HR:           150 bpm. Exam Location:  Inpatient Procedure: Limited Echo and Intracardiac Opacification Agent Indications:    I48.92* Unspecified atrial flutter  History:        Patient has prior history of Echocardiogram examinations, most                 recent 01/29/2020. COPD, Arrythmias:Atrial Flutter; Risk                 Factors:Hypertension, Diabetes and Dyslipidemia.  Sonographer:    Raquel Sarna Senior RDCS Referring Phys: Erskine Emery  Sonographer Comments: Limited to assess for EF prior to cardioversion. IMPRESSIONS  1. Limited echo to assess LVEF.  2. Left ventricular ejection fraction, by estimation, is 45 to 50%. The left ventricle has mildly decreased function. The left ventricle demonstrates global hypokinesis. There is mild concentric left ventricular hypertrophy.  3. The mitral valve is degenerative. Severe mitral annular calcification.  4. Patient is in Afib with RVR during study Comparison(s): Compared to prior study in 2021, the LVEF has dropped to 45-50% from 55-60% in the setting of Afib with RVR. FINDINGS  Left Ventricle: Left ventricular ejection fraction, by estimation, is 45 to 50%. The left ventricle has mildly decreased function.  The left ventricle demonstrates global hypokinesis. Definity contrast agent was given IV to delineate the left ventricular  endocardial borders. The left ventricular internal cavity size was normal in size. There is mild concentric left ventricular hypertrophy. Mitral Valve: The mitral valve is degenerative in appearance. There is moderate thickening of the mitral valve leaflet(s). There is moderate calcification of the mitral valve leaflet(s). Severe mitral annular calcification. Gwyndolyn Kaufman MD Electronically signed by Gwyndolyn Kaufman  MD Signature Date/Time: 06/05/2022/12:14:47 PM    Final    DG CHEST PORT 1 VIEW  Result Date: 06/05/2022 CLINICAL DATA:  66 year old female with shortness of breath. EXAM: PORTABLE CHEST 1 VIEW COMPARISON:  Chest radiographs 06/04/2022 and earlier. FINDINGS: Portable AP semi upright view at 0756 hours. Cardiomegaly, new from a 2017 chest CT. Stable cardiac size and mediastinal contours. Relatively large lung volumes. Mild chronic appearing increased interstitial markings. But improved perihilar ventilation from the previous exams. Probable mild lung scarring along the right heart border. No pneumothorax, pleural effusion, or confluent pulmonary opacity. Visualized tracheal air column is within normal limits. No acute osseous abnormality identified. Paucity of bowel gas the visible abdomen. IMPRESSION: 1. Cardiomegaly. Suspicion of chronic hyperinflation and lung scarring along the right heart border. 2. No convincing acute cardiopulmonary abnormality. Electronically Signed   By: Genevie Ann M.D.   On: 06/05/2022 08:08    Cardiac Studies   Echocardiogram (limited) 06/05/22  1. Limited echo to assess LVEF.   2. Left ventricular ejection fraction, by estimation, is 45 to 50%. The  left ventricle has mildly decreased function. The left ventricle  demonstrates global hypokinesis. There is mild concentric left ventricular  hypertrophy.   3. The mitral valve is degenerative. Severe mitral annular calcification.   4. Patient is in Afib with RVR during study   Patient Profile     67 y.o. female with past medical history of home O2 dependent COPD, diabetes mellitus, bipolar disorder, hypertension, atrial fibrillation for evaluation of atrial flutter in the setting of COPD flare/upper respiratory infection.  Last echocardiogram 2021 showed normal LV function.   Assessment & Plan    Paroxysmal Atrial Flutter HFmrEF  - Patient presented to the ED on 1/12 complaining of SOB, cough. Admitted for treatment of  COPD and CAP. Found to be in atrial flutter, HR elevated to the 150s.  - Echocardiogram showed EF 45-50% (down from 55-60% in 2021). Suspect this is tachy induced CM in the setting of aflutter with RVR  - Suspect that flutter is driven by respiratory disease. Continue to follow HR as breathing improves. If HR remains elevated after COPD exacerbation is resolved, could consider TEE guided DCCV. Patient has been told she cannot go under anesthesia due to advanced COPD.  - Given mildly reduced EF, ideally patient would not be on Cardizem. However, given patient's advanced COPD/lung disease, I am hesitant to start beta blocker or amiodarone. Maybe could use metoprolol. Patient is tolerating Cardizem well and BP is stable, EF only mildly reduced, OK to continue for now - Patient has been on dilt 90 mg q6hrs-- per telemetry, HR is predominantly well controlled  - Transition to Cardizem CD 360 mg daily (note patient already received short acting 90 mg at 0600, will give long acting 300 mg at 1200)  - Continue eliquis 5 mg BID  - Patient reports breathing has gotten worse. Also has trace ankle edema. Give lasix 40 mg IV once today and follow urine output, renal function    Mitral Valve Calcification  -  Limited echo this admission showed that the mitral valve was degenerative, severe mitral annular calcification - Previous echo in 2021 showed moderate mitral annular calcification, trivial MR - Consider repeat limited echo this admission vs outpatient follow up. Will discuss with MD    Otherwise per primary  - COPD exacerbation, CAP  - Urinary Retention  - Type 2 DM  - Chronic Pain   For questions or updates, please contact [place] Please consult [email or web address] for contact info under        Signed, Margie Billet, PA-C  06/07/2022, 7:25 AM

## 2022-06-07 NOTE — Progress Notes (Signed)
Physical Therapy Evaluation Patient Details Name: [name] [name] MRN: [id number] DOB: November 10, 1955 Today's Date: 06/07/2022  History of Present Illness  67 y.o. female presenting 06/03/22 with 3 day hx of progressive SOB and chest pain. +COPD exacerbation; pna; required BiPAP; pleuritic chest pain (troponins negative x2); atrial tachycardia; afib; PMH- transient afib; Bipolar 1; COPD (2L baseline); DM; HLD; HTN;  Clinical Impression   Pt admitted secondary to problem above with deficits below. PTA patient was living alone and independent with basic ADLs and mobility. She reports h/o several falls and that she's been told she needs to use a RW but chooses not to. Daughter assists with IADLs, including housework and grocery shopping.  Pt currently requires minguard assist with RW to ambulate 12 ft (limited due to elevated HR to 148 bpm).  Anticipate patient will benefit from PT to address problems listed below.Will continue to follow acutely to maximize functional mobility independence and safety.          Recommendations for follow up therapy are one component of a multi-disciplinary discharge planning process, led by the attending physician.  Recommendations may be updated based on patient status, additional functional criteria and insurance authorization.  Follow Up Recommendations Home health PT      Assistance Recommended at Discharge PRN  Patient can return home with the following  A little help with bathing/dressing/bathroom;Assistance with cooking/housework;Assist for transportation;Help with stairs or ramp for entrance    Equipment Recommendations None recommended by PT  Recommendations for Other Services       Functional Status Assessment Patient has had a recent decline in their functional status and demonstrates the ability to make significant improvements in function in a reasonable and predictable amount of time.     Precautions / Restrictions Precautions Precautions:  Fall Precaution Comments: reports several falls at home Restrictions Weight Bearing Restrictions: No      Mobility  Bed Mobility Overal bed mobility: Modified Independent             General bed mobility comments: HOB elevated and use of rail    Transfers Overall transfer level: Needs assistance Equipment used: Rolling walker (2 wheels) Transfers: Sit to/from Stand, Bed to chair/wheelchair/BSC Sit to Stand: Min assist   Step pivot transfers: Min guard       General transfer comment: with RW required cues for hand placement and assist to stabilize RW as moving hands up to RW; step-pivot without device from recliner to [facility] and back    Ambulation/Gait Ambulation/Gait assistance: Min guard Gait Distance (Feet): 12 Feet Assistive device: Rolling walker (2 wheels) Gait Pattern/deviations: Step-to pattern       General Gait Details: only around bed due to elevated HR; tight room and had to go sideways around the end of the bed and then walked backwards ~6 ft to chair; no imbalance and maneuvered RW herself  Stairs            Wheelchair Mobility    Modified Rankin (Stroke Patients Only)       Balance Overall balance assessment: History of Falls                                           Pertinent Vitals/Pain Pain Assessment Pain Assessment: No/denies pain    Home Living Family/patient expects to be discharged to:: Private residence Living Arrangements: Alone Available Help at Discharge: Family;Available PRN/intermittently Type of Home:  House Home Access: Ramped entrance       Home Layout: One level Home Equipment: Conservation officer, nature (2 wheels);Grab bars - tub/shower      Prior Function Prior Level of Function : Needs assist             Mobility Comments: states supposed to use RW for balance and due to LE joint pain, but does not ADLs Comments: daughter assists with housework and does grocery shopping     Hand Dominance         Extremity/Trunk Assessment   Upper Extremity Assessment Upper Extremity Assessment: Generalized weakness    Lower Extremity Assessment Lower Extremity Assessment: Generalized weakness    Cervical / Trunk Assessment Cervical / Trunk Assessment: Other exceptions Cervical / Trunk Exceptions: overweight  Communication   Communication: No difficulties  Cognition Arousal/Alertness: Awake/alert Behavior During Therapy: WFL for tasks assessed/performed                                   General Comments: decr safety awareness as does not use RW at home despite several falls where she could not get herself up off the floor        General Comments General comments (skin integrity, edema, etc.): HR 107-148 with sats >92% on 4L    Exercises     Assessment/Plan    PT Assessment Patient needs continued PT services  PT Problem List Decreased strength;Decreased activity tolerance;Decreased balance;Decreased mobility;Decreased knowledge of use of DME;Decreased safety awareness;Cardiopulmonary status limiting activity;Obesity       PT Treatment Interventions DME instruction;Gait training;Functional mobility training;Therapeutic activities;Therapeutic exercise;Balance training;Cognitive remediation;Patient/family education    PT Goals (Current goals can be found in the Care Plan section)  Acute Rehab PT Goals Patient Stated Goal: go home in a few days PT Goal Formulation: With patient Time For Goal Achievement: 06/21/22 Potential to Achieve Goals: Good    Frequency Min 3X/week     Co-evaluation               AM-PAC PT "6 Clicks" Mobility  Outcome Measure Help needed turning from your back to your side while in a flat bed without using bedrails?: None Help needed moving from lying on your back to sitting on the side of a flat bed without using bedrails?: A Little Help needed moving to and from a bed to a chair (including a wheelchair)?: A Little Help  needed standing up from a chair using your arms (e.g., wheelchair or bedside chair)?: A Little Help needed to walk in hospital room?: A Little Help needed climbing 3-5 steps with a railing? : A Lot 6 Click Score: 18    End of Session Equipment Utilized During Treatment: Gait belt;Oxygen Activity Tolerance: Treatment limited secondary to medical complications (Comment) (limited by elevated HR) Patient left: in chair;with call bell/phone within reach;with chair alarm set Nurse Communication: Mobility status;Other (comment) (h/o falls) PT Visit Diagnosis: Unsteadiness on feet (R26.81);Other abnormalities of gait and mobility (R26.89);Repeated falls (R29.6)    Time: 2202-5427 PT Time Calculation (min) (ACUTE ONLY): 33 min   Charges:   PT Evaluation $PT Eval Low Complexity: 1 Low PT Treatments $Gait Training: 8-22 mins         [place]  Office [phone]   Rexanne Mano 06/07/2022, 12:12 PM

## 2022-06-07 NOTE — Progress Notes (Signed)
Inpatient Diabetes Program Recommendations  AACE/ADA: New Consensus Statement on Inpatient Glycemic Control (2015)  Target Ranges:  Prepandial:   less than 140 mg/dL      Peak postprandial:   less than 180 mg/dL (1-2 hours)      Critically ill patients:  140 - 180 mg/dL   Lab Results  Component Value Date   GLUCAP 139 (H) 06/07/2022   HGBA1C 6.7 (H) 06/04/2022    Review of Glycemic Control  Latest Reference Range & Units 06/06/22 15:58 06/06/22 21:44 06/07/22 08:17  Glucose-Capillary 70 - 99 mg/dL 270 (H) 162 (H) 139 (H)   Diabetes history: DM 2 Outpatient Diabetes medications: None Current orders for Inpatient glycemic control:  Novolog 0-20 units tid with meals Inpatient Diabetes Program Recommendations:   Note patient received last dose of Prednisone today.   No recommendations for changes in insulin today.   Thanks,  Adah Perl, RN, BC-ADM Inpatient Diabetes Coordinator Pager [phone]  (8a-5p)

## 2022-06-08 DIAGNOSIS — I482 Chronic atrial fibrillation, unspecified: Secondary | ICD-10-CM | POA: Diagnosis not present

## 2022-06-08 DIAGNOSIS — I4892 Unspecified atrial flutter: Secondary | ICD-10-CM | POA: Diagnosis not present

## 2022-06-08 DIAGNOSIS — J9601 Acute respiratory failure with hypoxia: Secondary | ICD-10-CM | POA: Diagnosis not present

## 2022-06-08 LAB — BASIC METABOLIC PANEL
Anion gap: 15 (ref 5–15)
BUN: 32 mg/dL — ABNORMAL HIGH (ref 8–23)
CO2: 32 mmol/L (ref 22–32)
Calcium: 8.9 mg/dL (ref 8.9–10.3)
Chloride: 91 mmol/L — ABNORMAL LOW (ref 98–111)
Creatinine, Ser: 0.68 mg/dL (ref 0.44–1.00)
GFR, Estimated: >60 mL/min (ref >60)
Glucose, Bld: 143 mg/dL — ABNORMAL HIGH (ref 70–99)
Potassium: 4.3 mmol/L (ref 3.5–5.1)
Sodium: 138 mmol/L (ref 135–145)

## 2022-06-08 LAB — GLUCOSE, CAPILLARY
Glucose-Capillary: 139 mg/dL — ABNORMAL HIGH (ref 70–99)
Glucose-Capillary: 158 mg/dL — ABNORMAL HIGH (ref 70–99)
Glucose-Capillary: 185 mg/dL — ABNORMAL HIGH (ref 70–99)
Glucose-Capillary: 238 mg/dL — ABNORMAL HIGH (ref 70–99)
Glucose-Capillary: 271 mg/dL — ABNORMAL HIGH (ref 70–99)

## 2022-06-08 LAB — CULTURE, BLOOD (ROUTINE X 2)
Culture: NO GROWTH
Culture: NO GROWTH
Special Requests: ADEQUATE

## 2022-06-08 MED ORDER — METOPROLOL TARTRATE 25 MG PO TABS
25.0000 mg | ORAL_TABLET | Freq: Two times a day (BID) | ORAL | Status: DC
  Administered 2022-06-08 – 2022-06-11 (×7): 25 mg via ORAL
  Filled 2022-06-08 (×7): qty 1

## 2022-06-08 MED ORDER — METOPROLOL TARTRATE 12.5 MG HALF TABLET
12.5000 mg | ORAL_TABLET | Freq: Two times a day (BID) | ORAL | Status: DC
  Filled 2022-06-08: qty 1

## 2022-06-08 MED ORDER — DILTIAZEM HCL ER COATED BEADS 180 MG PO CP24
360.0000 mg | ORAL_CAPSULE | Freq: Every day | ORAL | Status: DC
  Administered 2022-06-08 – 2022-06-11 (×4): 360 mg via ORAL
  Filled 2022-06-08 (×4): qty 2

## 2022-06-08 NOTE — Progress Notes (Addendum)
Daily Progress Note Intern Pager: [phone]  Patient name: [name] [name] Medical record number: [id number] Date of birth: Oct 15, 1955 Age: 67 y.o. Gender: female  Primary Care Provider: [facility] [place] Consultants: Cardiology Code Status: Full  Pt Overview and Major Events to Date:  1/12 - Admitted 1/14 - Found to be in a flutter, increased diltiazem per cardiology 1/16 - Transitioned to long acting diltiazem, IV Lasix started  Assessment and Plan: [name] [name] is a 67 y.o. female presenting with 3 day hx of chest pain and progressive SOB, found to be in COPD exacerbation with superimposed CAP.  Pertinent PMH/PSH includes COPD on (?)3L at home, T2DM, bipolar 1 disorder, HTN.  * Acute hypoxic respiratory failure (HCC) Treating for for COPD exacerbation and CAP, breathing gradually improving per patient.  Patient with O2 saturation in mid 90s on 3.5 L Start with some work of breathing and reported orthopnea.  At home, patient takes Spiriva inhaler, will need triple therapy with Breztri or Trelegy given extent of patient's respiratory disease.  Will counsel patient prior to discharge. - Xopenex (levabuterol) Q6H scheduled - Duonebs Q6H as needed - s/p CTX 1g IV for 5 days - s/p azithromycin '500mg'$  IV for 3 days - s/p prednisone '40mg'$  PO for 4 days - Tylenol prn for pain - RT onboard - Continue Incruse Ellipta daily (LAMA) and Breo Ellipta (ICS+LABA)  Atrial flutter ([name] [name]) Patient is still in A-flutter on diltiazem, HR ranging from 90-120, but spiked to 150 and remained elevated this afternoon.  Will keep patient until tomorrow given persistent RVR to 150.  Cardiology onboard, recommendations below.  Patient currently feels well overall. - Cardiology consulted, appreciate recs: - Cardizem 360 mg PO daily - Continue apixaban 5 mg twice daily - Poor candidate for amiodarone d/t respiratory disease and current respiratory status - Considering cardioversion/TEE pending  respiratory improvement, may do outpt - Lasix IV 40 mg BID for edema  Urinary retention Patient had I/O cath on day of admission but has since had multiple episodes of spontaneous urination.  No suprapubic TTP or fullness on exam.  Net -875 mL output yesterday, continuing to diurese. - Strict I/O, continue to monitor - Bladder scan if not urinating, I&O >200 mL, if I&O x 3 start foley catheter  Non-insulin dependent type 2 diabetes mellitus ([name] [name]) Patient w/ DM2, not on insulin. A1c 6.7 06/03/22.  Required 14 units aspart in past 24 hours.  Glucose 139 this AM and generally stable. Regimen does not require changes at this time, will CTM. - CBG check ACHS - mSSI - Carb modified diet  Chronic pain Has chronic knee and hip pain due to OA.  Home medication includes Norco 3 times daily as needed which patient states she takes 3 times daily regularly. - Continue home Norco 3 times daily  FEN/GI: Carb modified PPx: Heparin Dispo: Home health PT refused, will d/c with DME materials tomorrow. Barriers include IV diuresis and persistent RVR.  Subjective:  Today, the patient is feeling better, believes she is close to baseline.  She is breathing better, though has some mild dyspnea and orthopnea.  Denies chest pain, pleuritic pain.  Inquires about going home today.  Objective: Temp:  [97.6 F (36.4 C)-98.3 F (36.8 C)] 98 F (36.7 C) (01/17 1214) Pulse Rate:  [72-146] 146 (01/17 1214) Resp:  [16-24] 19 (01/17 1214) BP: (120-129)/(70-96) 129/78 (01/17 1214) SpO2:  [90 %-98 %] 90 % (01/17 1214)  Physical Exam: General: Sitting up in bed comfortably, NAD, alert  and at baseline. Cardiovascular: Tachycardic, irregular rhythm. Normal S1/S2. No murmurs, rubs, or gallops appreciated. 2+ radial pulses. Pulmonary: Mild scattered end-expiratory wheezes. No increased WOB on 3.5L [place]. No rales or crackles. Abdominal: No tenderness to deep or light palpation. No rebound or guarding. Skin: Warm and  dry. Extremities: 1+ pitting LE edema bilaterally. Capillary refill <2 seconds.  Laboratory: Most recent CBC Lab Results  Component Value Date   WBC 6.3 06/07/2022   HGB 9.9 (L) 06/07/2022   HCT 32.7 (L) 06/07/2022   MCV 89.3 06/07/2022   PLT 150 06/07/2022   Most recent BMP    Latest Ref Rng & Units 06/08/2022    2:37 AM  BMP  Glucose 70 - 99 mg/dL 143   BUN 8 - 23 mg/dL 32   Creatinine 0.44 - 1.00 mg/dL 0.68   Sodium 135 - 145 mmol/L 138   Potassium 3.5 - 5.1 mmol/L 4.3   Chloride 98 - 111 mmol/L 91   CO2 22 - 32 mmol/L 32   Calcium 8.9 - 10.3 mg/dL 8.9    [name], [name], Medical Student 06/08/2022, 12:36 PM  MS4, [employer] SOM, [place] Intern pager: [phone], text pages welcome Secure chat group [place]   I was personally present and performed or re-performed the history, physical exam and medical decision making activities of this service and have verified that the service and findings are accurately documented in the student's note.  Still with elevated HR and diuresing per cards. Will continue to monitor today and likely discharge tomorrow.  [name] Key, DO                  06/08/2022, 1:05 PM

## 2022-06-08 NOTE — Progress Notes (Signed)
Rounding Note    Patient Name: [name] [name] Date of Encounter: 06/08/2022  [place] Cardiologist: Carlyle Dolly, MD   Subjective   Patient reports feeling much better this AM. No chest pain, palpitations. Breathing improving, but she continues to have orthopnea. Trace ankle edema.   Inpatient Medications    Scheduled Meds:  apixaban  5 mg Oral BID   atorvastatin  20 mg Oral QHS   benztropine  0.5 mg Oral BID   busPIRone  10 mg Oral TID   diltiazem  300 mg Oral Daily   divalproex  500 mg Oral BID   fluticasone furoate-vilanterol  1 puff Inhalation Daily   furosemide  40 mg Intravenous BID   HYDROcodone-acetaminophen  1 tablet Oral TID   influenza vaccine adjuvanted  0.5 mL Intramuscular Tomorrow-1000   insulin aspart  0-20 Units Subcutaneous TID WC   levalbuterol  1.25 mg Nebulization BID   magnesium oxide  400 mg Oral Daily   polyethylene glycol  17 g Oral Daily   QUEtiapine  25 mg Oral BID   And   QUEtiapine  50 mg Oral QHS   senna-docusate  1 tablet Oral QHS   sertraline  200 mg Oral Daily   umeclidinium bromide  1 puff Inhalation Daily   Continuous Infusions:  PRN Meds: acetaminophen **OR** acetaminophen, ipratropium-albuterol, nicotine   Vital Signs    Vitals:   06/07/22 1533 06/07/22 1914 06/07/22 2100 06/08/22 0532  BP: (!) 125/93  (!) 120/96 123/70  Pulse: (!) 131  81 72  Resp: 18  16 (!) 24  Temp: 97.6 F (36.4 C)  97.6 F (36.4 C) 98.3 F (36.8 C)  TempSrc: Oral  Oral Oral  SpO2: 98% 96% 96% 93%  Weight:      Height:        Intake/Output Summary (Last 24 hours) at 06/08/2022 0824 Last data filed at 06/07/2022 1652 Gross per 24 hour  Intake 1026 ml  Output 1901 ml  Net -875 ml      06/03/2022    7:33 PM 02/22/2020    8:17 PM 02/22/2020    6:48 AM  Last 3 Weights  Weight (lbs) 244 lb 195 lb 1.7 oz 198 lb 13.7 oz  Weight (kg) 110.678 kg 88.5 kg 90.2 kg      Telemetry    Atrial flutter, HR predominantly in the 70s.  Brief episodes of tachycardia with HR to the 130s - Personally Reviewed  ECG     No new tracings since 1/13- Personally Reviewed  Physical Exam   GEN: No acute distress. Laying in bed with head elevated. Wearing [place]  Neck: No JVD with head at 30 degrees  Cardiac: RRR, grade 2/6 systolic murmur at RUSB Respiratory: Diminished breath sounds in bilateral lung bases. Some expiratory wheezing, but improved from yesterday  GI: Soft, mildly distended  MS: Trace edema in BLE; No deformity. Neuro:  Nonfocal  Psych: Normal affect   Labs    High Sensitivity Troponin:   Recent Labs  Lab 06/03/22 1925 06/03/22 2125 06/04/22 1937 06/04/22 2141  TROPONINIHS '10 9 9 10     '$ Chemistry Recent Labs  Lab 06/04/22 0530 06/04/22 0549 06/05/22 0150 06/06/22 0215 06/07/22 0221 06/08/22 0237  NA 137   < > 138 141 141 138  K 4.4   < > 4.7 4.7 4.7 4.3  CL 94*  --  96* 97* 98 91*  CO2 34*  --  32 36* 35* 32  GLUCOSE 215*  --  153* 144* 131* 143*  BUN 13  --  27* 28* 29* 32*  CREATININE 0.65  --  0.73 0.62 0.65 0.68  CALCIUM 8.8*  --  8.6* 9.0 9.2 8.9  MG 2.0  --  2.2  --   --   --   PROT 6.6  --  6.4*  --   --   --   ALBUMIN 3.4*  --  3.4*  --   --   --   AST 14*  --  13*  --   --   --   ALT 12  --  11  --   --   --   ALKPHOS 65  --  56  --   --   --   BILITOT 0.4  --  0.2*  --   --   --   GFRNONAA >60  --  >60 >60 >60 >60  ANIONGAP 9  --  '10 8 8 15   '$ < > = values in this interval not displayed.    Lipids No results for input(s): "CHOL", "TRIG", "HDL", "LABVLDL", "LDLCALC", "CHOLHDL" in the last 168 hours.  Hematology Recent Labs  Lab 06/05/22 0150 06/06/22 0215 06/07/22 0221  WBC 7.3 6.2 6.3  RBC 3.56* 3.41* 3.66*  HGB 9.4* 9.2* 9.9*  HCT 31.5* 31.1* 32.7*  MCV 88.5 91.2 89.3  MCH 26.4 27.0 27.0  MCHC 29.8* 29.6* 30.3  RDW 15.6* 15.6* 15.3  PLT 149* 137* 150   Thyroid  Recent Labs  Lab 06/04/22 0530  TSH 0.873    BNP Recent Labs  Lab 06/04/22 1916  BNP 435.8*     DDimer No results for input(s): "DDIMER" in the last 168 hours.   Radiology    No results found.  Cardiac Studies   Echocardiogram (limited) 06/05/22  1. Limited echo to assess LVEF.   2. Left ventricular ejection fraction, by estimation, is 45 to 50%. The  left ventricle has mildly decreased function. The left ventricle  demonstrates global hypokinesis. There is mild concentric left ventricular  hypertrophy.   3. The mitral valve is degenerative. Severe mitral annular calcification.   4. Patient is in Afib with RVR during study   Patient Profile     67 y.o. female  with past medical history of home O2 dependent COPD, diabetes mellitus, bipolar disorder, hypertension, atrial fibrillation for evaluation of atrial flutter in the setting of COPD flare/upper respiratory infection.  Last echocardiogram 2021 showed normal LV function.   Assessment & Plan    Paroxysmal Atrial Flutter HFmrEF  - Patient presented to the ED on 1/12 complaining of SOB, cough. Admitted for treatment of COPD and CAP. Found to be in atrial flutter, HR elevated to the 150s.  - Echocardiogram showed EF 45-50% (down from 55-60% in 2021). Suspect this is tachy induced CM in the setting of aflutter with RVR  - Atrial Flutter is likely driven by respiratory disease. Continue to follow HR as breathing improves, overall well controlled now. As an outpatient, could consider DCCV after 3 weeks uninterrupted DOAC. However, patient has been told she cannot go under anesthesia due to advanced COPD.  - Given patient's advanced COPD/lung disease, I am hesitant to start beta blocker or amiodarone. Maybe could use metoprolol. Patient is tolerating Cardizem well and BP is stable, EF only mildly reduced, OK to continue for now - Continue cardizem CD 360 mg daily -- HR overall well controlled, brief and infrequent episodes of tachycardia  - Continue eliquis 5  mg BID  - Patient currently on IV lasix 40 mg BID-- output 1.9 L urine  yesterday. Renal function stable. Breathing improved, but continues to have orthopnea. Continue IV for now   Mitral Valve Calcification  - Limited echo this admission showed that the mitral valve was degenerative, severe mitral annular calcification - Previous echo in 2021 showed moderate mitral annular calcification, trivial MR - Follow as an outpatient    Otherwise per primary  - COPD exacerbation, CAP  - Urinary Retention  - Type 2 DM  - Chronic Pain   For questions or updates, please contact [place] Please consult [email or web address] for contact info under        Signed, Margie Billet, PA-C  06/08/2022, 8:24 AM

## 2022-06-08 NOTE — Progress Notes (Signed)
Mobility Specialist - Progress Note   06/08/22 1602  Mobility  Activity Ambulated with assistance in room  Level of Assistance Minimal assist, patient does 75% or more  Assistive Device Front wheel walker  Distance Ambulated (ft) 20 ft  Activity Response Tolerated well  Mobility Referral Yes  $Mobility charge 1 Mobility   Pt received in bed and agreeable to mobility. No complaints throughout. Pt ambulated on 3LO2. Pt was returned to chair with all needs met.  Franki Monte  Mobility Specialist Please contact via S[facility] or Rehab office at [phone]

## 2022-06-08 NOTE — Plan of Care (Addendum)
    Notified by RN that patient's HR was sustaining in the 150s. Per telemetry, patient remains in atrial flutter and her HR has been in the 150s since 1000 today. Patient asymptomatic and unaware that her HR is elevated. BP 129/78. Starting PO metoprolol 25 mg BID    Addendum 1340- Per telemetry, HR now ranging from 100-110. Patient asymptomatic.   Margie Billet, PA-C 06/08/2022 12:28 PM

## 2022-06-08 NOTE — Discharge Summary (Signed)
[facility] Discharge Summary  Patient name: [name] [name] Medical record number: [id number] Date of birth: Oct 07, 1955 Age: 67 y.o. Gender: female Date of Admission: 06/03/2022  Date of Discharge: 06/11/22 Admitting Physician: Lind Covert, MD  Primary Care Provider: [facility] Medical Consultants: Cardiology  Indication for Hospitalization: COPD exacerbation with superimposed CAP  Discharge Diagnoses/Problem List: Principal Problem:   Atrial flutter [facility] [facility]) Active Problems:   Acute hypoxic respiratory failure ([place])   Non-insulin dependent type 2 diabetes mellitus ([place])   Urinary retention   COPD exacerbation ([place])   COPD still smoking/ pft's pending    Chronic respiratory failure ([place])   Chronic pain   Heart failure with mid-range ejection fraction [facility])   Community acquired bacterial pneumonia  Brief Hospital Course:  [name] [name] is a 67 y.o.female with a history of COPD on 2L at home, T2DM, bipolar 1 disorder, HTN who was admitted to the [place] [facility] for COPD exacerbation with superimposed CAP.  * Acute hypoxic respiratory failure ([place]) COPD with superimposed community acquired pneumonia Patient presented with cough and progressive shortness of breath.  Placed on BiPAP (baseline 2L O2 by [place] at home).  CXR showed patchy opacity, possible developing pneumonia.  EKG and troponins were unremarkable, BNP mildly elevated to 436.  Respiratory pathogen panel negative.  Started ceftriaxone 1 g IV daily for 5 days and azithromycin 500 mg IV daily for 3 days. Started on scheduled Xopenex and Duonebs.  Received 1 dose of Solu-Medrol in ED and started on 40 mg prednisone p.o. daily for 4 more days.  Patient switched from BiPAP and placed on 5 L O2 by [place], gradually weaned throughout hospitalization.  Finished antibiotics and steroid course prior to discharge.  Received influenza vaccine inpatient.  Was able to ambulate on 3L of  O2 with O2 sats remaining above 90% prior to discharge.  Placed patient on triple acting maintenance inhaler therapy with Incruse Ellipta and Breo Ellipta, showed significant improvement.  At discharge, discontinued patient's home Spiriva inhaler and replaced with Trelegy.  Atrial flutter  HFmrEF Patient presented tachycardic to 120-140 per EKG, initially believed tachycardia was 2/2 illness.  Repeat EKG showed Qtc of 500 and tachycardia persisted.  No significant electrolyte abnormalities.  Cardiology consulted and diagnosed atrial flutter, increased patient's home diltiazem to 90 mg Q6H.  TTE showed 45-50% EF (decreased verus 2021) with severe mitral valve calcification/degeneration.  Cardiology considered TEE guided cardioversion after improvement of respiratory status, but opted to follow up outpatient.  Heparin gtt initially started and then transitioned to Eliquis 5 mg twice daily to be continued indefinitely.  Transitioned to taking diltiazem 360 mg long acting daily.  Required Lasix 40 mg IV BID after pitting edema noted on exam.  Patient again had sustained tachycardia in 150s and remained in atrial flutter, requiring p.o. metoprolol 25 mg twice daily for 1 day.  HR remained elevated and TEE guided cardioversion was performed with successful conversion to NSR.  Discharged on diltiazem 360 mg PO daily, Lasix 40 mg PO daily, Eliquis 5 mg daily, metoprolol '25mg'$  BID.  Urinary retention Patient complained of suprapubic pain/fullness on admission and was noted to have 400 cc on bladder scan. Patient was I/O cathed x1 and received bladder scans every 6 hours for 2 days.  Continued to spontaneously void and did not require any further I&O caths.  Bladder scan volumes consistently remained less than 200 mL.   Other chronic conditions were medically managed with home medications and  formulary alternatives as necessary (chronic pain, T2DM, HTN, bipolar disorder, anxiety, depression)  Issues for follow  up Consider referral to pulmonology due to advanced pulmonary disease Follow up on new Trelegy compliance, which replaced Spiriva at discharge Repeat echo in 3 months to review EF changes on rate control per cardiology Stopped trazodone (added melatonin), tizanidine, and amitriptyline given anticholinergic and sedating effects; recommend increasing anxiety meds versus adding sedation Consider switching sertraline to mirtazapine given better effect profile for chronic pain and sleep difficulties Follow up with psych to discuss discontinuing benztropine and/or switching quetiapine to avoid EPS; concern for anticholinergic effects of benztropine Patient declined home health PT services, received DME rolling walker and shower chair, bedside commode unaffordable  Disposition: Home with DME, refused home health PT  Discharge Condition: Stable on 3L O2   Discharge Exam:  Vitals:   06/11/22 0010 06/11/22 0413  BP: (!) 108/55 (!) 101/59  Pulse: 73 71  Resp: 18 19  Temp: 97.7 F (36.5 C) 98.1 F (36.7 C)  SpO2: 96% 95%   General: NAD, resting comfortably Cardiovascular: RRR, 2/6 systolic murmur appreciated.  NSR on monitor Respiratory: CTAB normal WOB on 3L [place] Abdomen: Soft NT/ND Extremities: Trace bilateral lower extremity edema  Significant Procedures: TEE w/ cardioversion  Significant Labs and Imaging:    Recent Labs  Lab 06/10/22 0315  NA 141  K 3.8  CL 96*  CO2 34*  GLUCOSE 170*  BUN 38*  CREATININE 0.84  CALCIUM 9.4   Pertinent Imaging: - CXR 1/12: Patchy opacity, possible pneumonia] - CXR 1/13: Mild focal opacity in right lower lung adjacent to the heart border, suspicious for mild infiltrate. - Echo 1/14: 45-50% with global hypokinesis, severe mitral valve calcification/degeneration, Afib with RVR  Results/Tests Pending at Time of Discharge: - None  Discharge Medications:  Allergies as of 06/11/2022       Reactions   Bactrim [sulfamethoxazole-trimethoprim]  Anaphylaxis   Amoxicillin Hives   Diclofenac Tinitus   swelling in feet        Medication List     STOP taking these medications    amitriptyline 100 MG tablet Commonly known as: ELAVIL   Spiriva HandiHaler 18 MCG inhalation capsule Generic drug: tiotropium   tiZANidine 4 MG tablet Commonly known as: ZANAFLEX   traZODone 50 MG tablet Commonly known as: DESYREL       TAKE these medications    albuterol (5 MG/ML) 0.5% nebulizer solution Commonly known as: PROVENTIL Take 2.5 mg by nebulization every 6 (six) hours as needed for wheezing or shortness of breath.   amLODipine 5 MG tablet Commonly known as: NORVASC Take 5 mg by mouth daily.   atorvastatin 20 MG tablet Commonly known as: LIPITOR Take 20 mg by mouth at bedtime.   benztropine 0.5 MG tablet Commonly known as: COGENTIN Take 1 tablet (0.5 mg total) by mouth 2 (two) times daily.   busPIRone 10 MG tablet Commonly known as: BUSPAR Take 10 mg by mouth 3 (three) times daily.   Cartia XT 180 MG 24 hr capsule Generic drug: diltiazem Take 2 capsules (360 mg total) by mouth daily.   Combivent Respimat 20-100 MCG/ACT Aers respimat Generic drug: Ipratropium-Albuterol Inhale 2 puffs into the lungs every 6 (six) hours as needed for wheezing or shortness of breath.   divalproex 500 MG DR tablet Commonly known as: DEPAKOTE Take 500 mg by mouth 2 (two) times daily.   Eliquis 5 MG Tabs tablet Generic drug: apixaban Take 1 tablet (5 mg total) by  mouth 2 (two) times daily.   furosemide 40 MG tablet Commonly known as: LASIX Take 1 tablet (40 mg total) by mouth daily.   HYDROcodone-acetaminophen 5-325 MG tablet Commonly known as: NORCO/VICODIN Take 1 tablet by mouth 3 (three) times daily as needed for moderate pain or severe pain.   lisinopril-hydrochlorothiazide 10-12.5 MG tablet Commonly known as: ZESTORETIC Take 1 tablet by mouth daily.   magnesium oxide 400 MG tablet Commonly known as: MAG-OX Take 400  mg by mouth daily.   metoprolol tartrate 25 MG tablet Commonly known as: LOPRESSOR Take 1 tablet (25 mg total) by mouth 2 (two) times daily.   OXYGEN Inhale 2 L into the lungs daily.   QUEtiapine 25 MG tablet Commonly known as: SEROQUEL Take 1 tablet (25 mg total) by mouth at bedtime. What changed:  how much to take when to take this additional instructions   sertraline 100 MG tablet Commonly known as: ZOLOFT Take 200 mg by mouth daily.   Trelegy Ellipta 100-62.5-25 MCG/ACT Aepb Generic drug: Fluticasone-Umeclidin-Vilant Take 1 Inhalation by mouth daily.   valproic acid 250 MG capsule Commonly known as: Depakene Take 2 capsules (500 mg total) by mouth 2 (two) times daily.   Vitamin D (Ergocalciferol) 1.25 MG (50000 UNIT) Caps capsule Commonly known as: DRISDOL Take 50,000 Units by mouth once a week. Mondays               Durable Medical Equipment  (From admission, onward)           Start     Ordered   06/08/22 1232  For home use only DME 4 wheeled rolling walker with seat  Once       Question:  Patient needs a walker to treat with the following condition  Answer:  General weakness   06/08/22 1232   06/08/22 1231  For home use only DME Shower stool  Once        06/08/22 1232           Discharge Instructions: Please refer to Patient Instructions section of EMR for full details.  Patient was counseled important signs and symptoms that should prompt return to medical care, changes in medications, dietary instructions, activity restrictions, and follow up appointments.   Follow-Up Appointments:  Follow-up Information     [facility] Follow up.   Why: Rolling walker with seat- possible wheelchair if the patient is willing to pay for DME. Contact information: [address] [place] [address] [place], [facility]. Schedule an appointment as soon as possible for a visit in 3 day(s).   Why: Please call PCP to  set up a follow up appointment within the next 3 days. Contact information: [place] [address] [phone]                 August Albino, MD 06/11/2022, 7:04 AM PGY-1, [place]

## 2022-06-08 NOTE — Evaluation (Signed)
Occupational Therapy Evaluation Patient Details Name: [name] [name] MRN: [id number] DOB: [birth year] Today's Date: 06/08/2022   History of Present Illness 67 y.o. female presenting 06/03/22 with 3 day hx of progressive SOB and chest pain. +COPD exacerbation; pna; required BiPAP; pleuritic chest pain (troponins negative x2); atrial tachycardia; afib; PMH- transient afib; Bipolar 1; COPD (2L baseline); DM; HLD; HTN.   Clinical Impression   Pt admitted as above, presenting with deficits as listed below (refer to OT problem list). Pt lives alone, reports that daughter assists with IADL's and that she is Mod I ADL's prior to this admission. Pt currently Min assist for sit to stand and Min guard assist for functional transfers in room. Pt endorses that she should use RW and has had h/o falls at home but does not generally do so. Pt was educated in recommendation of shower chair use at home as she states that her tub will not accommodate 3:1 "my tub is too small for that". Pt should benefit from acute OT to assist in maximizing independence with ADL's, transfers and pt education/DME recommendations.     Recommendations for follow up therapy are one component of a multi-disciplinary discharge planning process, led by the attending physician.  Recommendations may be updated based on patient status, additional functional criteria and insurance authorization.   Follow Up Recommendations  No OT follow up     Assistance Recommended at Discharge PRN  Patient can return home with the following A little help with walking and/or transfers;A little help with bathing/dressing/bathroom;Assistance with cooking/housework;Help with stairs or ramp for entrance    Functional Status Assessment  Patient has had a recent decline in their functional status and demonstrates the ability to make significant improvements in function in a reasonable and predictable amount of time.  Equipment Recommendations  Tub/shower  seat (Recommend shower chair secondary to pt lives alone with w/ h/o falls and hip pain. Pt reports 3:1 will not fit in tub)    Recommendations for Other Services       Precautions / Restrictions Precautions Precautions: Fall Precaution Comments: reports several falls at home      Mobility Bed Mobility Overal bed mobility: Modified Independent     General bed mobility comments: HOB elevated and use of rail    Transfers Overall transfer level: Needs assistance Equipment used: 1 person hand held assist Transfers: Sit to/from Stand, Bed to chair/wheelchair/BSC Sit to Stand: Min assist     Step pivot transfers: Min guard      Balance Overall balance assessment: History of Falls     ADL either performed or assessed with clinical judgement   ADL Overall ADL's : Needs assistance/impaired Eating/Feeding: Independent;Sitting;Bed level   Grooming: Wash/dry hands;Wash/dry face;Set up;Sitting Grooming Details (indicate cue type and reason): Sitting up in chair Upper Body Bathing: Set up;Sitting   Lower Body Bathing: Min guard;Sit to/from stand;Sitting/lateral leans   Upper Body Dressing : Set up;Sitting   Lower Body Dressing: Min guard;Sit to/from stand   Toilet Transfer: Min guard;Ambulation;BSC/3in1 Toilet Transfer Details (indicate cue type and reason): Simulated vis transfer from bed to recliner. Pt declined RW use Toileting- Clothing Manipulation and Hygiene: Modified independent;Sitting/lateral Chartered certified accountant Details (indicate cue type and reason): TBD Functional mobility during ADLs: Min guard;Minimal assistance (HHA, pt declined RW use in room) General ADL Comments: Pt was educated in OT, role of OT and shwer chair use using 3:1 vs shower chair. Pt reports that 3:1 will not fit in her tub, will  recommend shower chair from home. Discussed home set-up and possibly placing chair in kitchen to take rest breaks PRN, however pt states that "My recliner chair  isn't very far away from there if I need to sit". Will follow acutely to assist in maximizing independence with ADL and self care tasks, pt education (EC tech) as well as endurance.     Vision Baseline Vision/History: 1 Wears glasses (reading glasses) Ability to See in Adequate Light: 0 Adequate Patient Visual Report: No change from baseline Vision Assessment?: No apparent visual deficits            Pertinent Vitals/Pain Pain Assessment Pain Assessment: No/denies pain     Hand Dominance Right   Extremity/Trunk Assessment Upper Extremity Assessment Upper Extremity Assessment: Overall WFL for tasks assessed;Generalized weakness   Lower Extremity Assessment Lower Extremity Assessment: Defer to PT evaluation    Communication Communication Communication: No difficulties   Cognition Arousal/Alertness: Awake/alert Behavior During Therapy: WFL for tasks assessed/performed Overall Cognitive Status: Within Functional Limits for tasks assessed   General Comments: Pt with decreased safety awareness as does not use RW at home despite several falls where she could not get herself up off the floor. Pt also states that she has SPC that she "is learning to use" but most often does not     General Comments  O2 Sats ranged from 88-96% during functional moblity and transfers on 3L via nasal cannula. Pt able to increase O2 from 88 to 92% within seconds when given verbal cues to perform pursed lip breathing technique.            Home Living Family/patient expects to be discharged to:: Private residence Living Arrangements: Alone Available Help at Discharge: Family;Available PRN/intermittently Type of Home: House Home Access: Ramped entrance   Home Layout: One level   Bathroom Shower/Tub: Tub/shower unit   Home Equipment: Conservation officer, nature (2 wheels);Grab bars - tub/shower;BSC/3in1;Cane - single point      Prior Functioning/Environment Prior Level of Function : Needs assist   Mobility  Comments: states supposed to use RW for balance and due to LE joint pain, but does not ADLs Comments: daughter assists with housework and does grocery shopping. Pt reports Mod I bathing and dressing as well as simple meal/snack prep        OT Problem List: Impaired balance (sitting and/or standing);Decreased knowledge of precautions;Decreased safety awareness;Obesity;Cardiopulmonary status limiting activity;Decreased activity tolerance;Decreased knowledge of use of DME or AE      OT Treatment/Interventions: Self-care/ADL training;Patient/family education;Energy conservation;Therapeutic activities;DME and/or AE instruction    OT Goals(Current goals can be found in the care plan section) Acute Rehab OT Goals Patient Stated Goal: Go home, get better at walking OT Goal Formulation: With patient Time For Goal Achievement: 06/22/22 Potential to Achieve Goals: Good  OT Frequency: Min 2X/week       AM-PAC OT "6 Clicks" Daily Activity     Outcome Measure Help from another person eating meals?: None Help from another person taking care of personal grooming?: A Little Help from another person toileting, which includes using toliet, bedpan, or urinal?: A Little Help from another person bathing (including washing, rinsing, drying)?: A Little Help from another person to put on and taking off regular upper body clothing?: None Help from another person to put on and taking off regular lower body clothing?: A Little 6 Click Score: 20   End of Session Equipment Utilized During Treatment: Oxygen Nurse Communication: Mobility status;Other (comment) (OT recommends shower chair)  Activity Tolerance:  Patient tolerated treatment well Patient left: in chair;with call bell/phone within reach;Other (comment) (Respiratory)  OT Visit Diagnosis: History of falling (Z91.81);Unsteadiness on feet (R26.81)                Time: 8182-9937 OT Time Calculation (min): 25 min Charges:  OT General Charges $OT Visit:  1 Visit OT Evaluation $OT Eval Low Complexity: 1 Low  [place] Agent Beth Dixon, OTR/L 06/08/2022, 9:23 AM

## 2022-06-08 NOTE — TOC Initial Note (Addendum)
Transition of Care [facility]) - Initial/Assessment Note    Patient Details  Name: [name] [name] MRN: [id number] Date of Birth: 1955-11-12  [facility]) CM/SW Contact:    Bethena Roys, RN Phone Number: 06/08/2022, 12:25 PM  Clinical Narrative:  Patient presented for acute hypoxic respiratory failure. Patient states she lives in an apartment alone and has oxygen via [employer]. Patient has declined HH PT Services- MD is aware. Patient is agreeable to [employer] delivering DME Rollator and shower chair if cost effective to the room. Patient reports that she has oxygen for travel- [employer] will confirm before delivering the other DME to the room. No further home needs identified.            1620 06-08-22 RN asked Case Manager to order a bedside commode. Patient had DME shower chair two years ago via insurance. Patient will have to pay out of pocket $78.55. Daughter not willing to pay the cost. Staff RN and MD aware. No further needs identified at this time.    Expected Discharge Plan: Home/Self Care Barriers to Discharge: No Barriers Identified   Patient Goals and CMS Choice Patient states their goals for this hospitalization and ongoing recovery are:: plan to return home.   Expected Discharge Plan and Services In-house Referral: NA Discharge Planning Services: CM Consult Post Acute Care Choice: Durable Medical Equipment Living arrangements for the past 2 months: Apartment                 DME Arranged: Walker rolling with seat, Shower stool DME Agency: [employer] Date DME Agency Contacted: 06/08/22 Time DME Agency Contacted: 1223 Representative spoke with at DME Agency: [place]: Patient Refused [place] (MD is aware of refusal.)   Prior Living Arrangements/Services Living arrangements for the past 2 months: Apartment Lives with:: Self Patient language and need for interpreter reviewed:: Yes Do you feel safe going back to the place where you live?: Yes      Need  for Family Participation in Patient Care: No (Comment) Care giver support system in place?: No (comment) Current home services: DME (Oxygen via [employer] Criminal Activity/Legal Involvement Pertinent to Current Situation/Hospitalization: No - Comment as needed  Activities of Daily Living Home Assistive Devices/Equipment: None ADL Screening (condition at time of admission) Patient's cognitive ability adequate to safely complete daily activities?: Yes Is the patient deaf or have difficulty hearing?: No Does the patient have difficulty seeing, even when wearing glasses/contacts?: No Does the patient have difficulty concentrating, remembering, or making decisions?: No Patient able to express need for assistance with ADLs?: No Does the patient have difficulty dressing or bathing?: No Independently performs ADLs?: Yes (appropriate for developmental age) Does the patient have difficulty walking or climbing stairs?: No Weakness of Legs: None Weakness of Arms/Hands: None  Permission Sought/Granted Permission sought to share information with : Case Manager Permission granted to share information with : Yes, Verbal Permission Granted     Permission granted to share info w AGENCY: [employer]        Emotional Assessment Appearance:: Appears stated age Attitude/Demeanor/Rapport: Engaged Affect (typically observed): Appropriate Orientation: : Oriented to Self, Oriented to Place Alcohol / Substance Use: Not Applicable Psych Involvement: No (comment)  Admission diagnosis:  COPD exacerbation ([place]) [J44.1] Acute hypoxic respiratory failure ([place]) [J96.01] Patient Active Problem List   Diagnosis Date Noted   Chronic pain 06/05/2022   Atrial flutter ([place]) 06/04/2022   Pleuritic chest pain 06/04/2022   Urinary retention 06/04/2022   Acute hypoxic respiratory failure ([place])  06/03/2022   Dysphagia    Adult failure to thrive    Dyspnea    Necrotizing fasciitis of lower leg (HCC)    Hypoxia     Petechial rash    Toe infection    Nasogastric tube present    Palliative care by specialist    Goals of care, counseling/discussion    DNR (do not resuscitate)    Atrial fibrillation with RVR ([place]) 02/05/2020   Pleural effusion on right 02/05/2020   Leukocytosis 02/05/2020   Gangrene of toe of right foot ([place])    Cellulitis of right lower leg    Sepsis ([place]) 01/26/2020   COPD still smoking/ pft's pending  12/27/2012   Chronic respiratory failure ([place]) 12/27/2012   Personal history of colonic polyps 12/11/2012   Chronic diarrhea 12/11/2012   Benign neoplasm of colon 12/11/2012   Migraine 12/04/2012   Alcohol abuse 12/04/2012   Gait instability 08/29/2012   Tremor 08/29/2012   Hyperlipidemia 07/08/2012   Acute respiratory failure with hypoxia and hypercapnia ([place]) 07/08/2012   COPD exacerbation ([place]) 04/13/2012   Acute respiratory failure ([place]) 04/13/2012   Low back pain 04/13/2012   Thrombocytopenia ([place]) 04/13/2012   Non-insulin dependent type 2 diabetes mellitus ([place]) 04/13/2012   Tobacco abuse 04/13/2012   Hypertension    Bipolar 1 disorder ([place])    Insomnia    Depression    Suicidal thoughts    PCP:  [facility], [place]:   [id number] [place], [place] [place] [address] Phone: [phone] Fax: [phone]  [id number] [place], [id number]: [phone]: [phone] Social Determinants of Health (SDOH) Social History: SDOH Screenings   Food Insecurity: No Food Insecurity (06/04/2022)  Housing: Low Risk  (06/04/2022)  Transportation Needs: No Transportation Needs (06/04/2022)  Utilities: Not At Risk (06/04/2022)  Tobacco Use: High Risk (06/04/2022)   Readmission Risk Interventions     No data to display

## 2022-06-08 NOTE — Progress Notes (Signed)
Physical Therapy Treatment Patient Details Name: Manika Hast MRN: [id number] DOB: 07/22/1955 Today's Date: 06/08/2022   History of Present Illness Pt is a 67 y.o. female presenting 06/03/22 with 3-day h/o progressive SOB and chest pain. Workup for COPD exacerbation, CAP, aflutter. PMH includes afib, COPD (2 L O2 baseline), DM2, HTN, HLD, chronic pain, bipolar 1 disorder.   PT Comments    Pt progressing with mobility. Today's session focused on transfer and gait training with rollator for added stability and energy conservation; pt moving well with supervision for safety, cues for breathing strategies and activity pacing. Pt remains limited by generalized weakness, decreased activity tolerance, and impaired balance strategies/postural reactions. Pt interested in rollator use for discharge, would benefit from bariatric-size. Will continue to follow acutely to address established goals.  SpO2 88-94% on 3L O2 [place] (difficulty getting reliable pulse ox reading) HR 120s-151    Recommendations for follow up therapy are one component of a multi-disciplinary discharge planning process, led by the attending physician.  Recommendations may be updated based on patient status, additional functional criteria and insurance authorization.  Follow Up Recommendations  Home health PT     Assistance Recommended at Discharge PRN  Patient can return home with the following A little help with bathing/dressing/bathroom;Assistance with cooking/housework;Assist for transportation;Help with stairs or ramp for entrance   Equipment Recommendations  Rollator (4 wheels) - bariatric if possible    Recommendations for Other Services Mobility Specialist      Precautions / Restrictions Precautions Precautions: Fall;Other (comment) Precaution Comments: watch SpO2 (wears 2L O2 baseline) Restrictions Weight Bearing Restrictions: No     Mobility  Bed Mobility               General bed mobility comments:  received sitting in recliner    Transfers Overall transfer level: Needs assistance Equipment used: Rollator (4 wheels) Transfers: Sit to/from Stand Sit to Stand: Supervision           General transfer comment: supervision for safety with first time using rollator, pt with good carryover of instructions for brakes; multiple sit<>stands from recliner    Ambulation/Gait Ambulation/Gait assistance: Supervision Gait Distance (Feet): 40 Feet (+ 80') Assistive device: Rollator (4 wheels) Gait Pattern/deviations: Step-through pattern, Decreased stride length Gait velocity: Decreased     General Gait Details: slow, steady gait with rollator, supervision for safety/lines; 1x seated rest break due to SOB, cues for pursed lip breathing; good ability to manage rollator   Stairs             Wheelchair Mobility    Modified Rankin (Stroke Patients Only)       Balance Overall balance assessment: Needs assistance Sitting-balance support: No upper extremity supported, Feet supported Sitting balance-Leahy Scale: Good     Standing balance support: No upper extremity supported, During functional activity Standing balance-Leahy Scale: Fair Standing balance comment: can take steps without DME, stability improved with rollator use                            Cognition Arousal/Alertness: Awake/alert Behavior During Therapy: WFL for tasks assessed/performed Overall Cognitive Status: Within Functional Limits for tasks assessed                                 General Comments: pt reports awareness that she should use walker, but she doesn't like them "because it's like a big flag pointing to  me that I"m old" - when discussing potential for rollator use, pt expresses interest and seems to understand need        Exercises      General Comments General comments (skin integrity, edema, etc.): SpO2 88-94% on 3L O2  though difficulty getting reliable pulse ox  reading. HR up to 151 with activity. pt interested in rollator use for d/c, educ on use for added stability and energy conservation      Pertinent Vitals/Pain Pain Assessment Pain Assessment: No/denies pain    Home Living Family/patient expects to be discharged to:: Private residence Living Arrangements: Alone Available Help at Discharge: Family;Available PRN/intermittently Type of Home: House Home Access: Ramped entrance       Home Layout: One level Home Equipment: Conservation officer, nature (2 wheels);Grab bars - tub/shower;BSC/3in1;Cane - single point      Prior Function            PT Goals (current goals can now be found in the care plan section) Progress towards PT goals: Progressing toward goals    Frequency    Min 3X/week      PT Plan Current plan remains appropriate    Co-evaluation              AM-PAC PT "6 Clicks" Mobility   Outcome Measure  Help needed turning from your back to your side while in a flat bed without using bedrails?: None Help needed moving from lying on your back to sitting on the side of a flat bed without using bedrails?: None Help needed moving to and from a bed to a chair (including a wheelchair)?: A Little Help needed standing up from a chair using your arms (e.g., wheelchair or bedside chair)?: A Little Help needed to walk in hospital room?: A Little Help needed climbing 3-5 steps with a railing? : A Little 6 Click Score: 20    End of Session Equipment Utilized During Treatment: Gait belt;Oxygen Activity Tolerance: Patient tolerated treatment well Patient left: in chair;with call bell/phone within reach;with chair alarm set Nurse Communication: Mobility status PT Visit Diagnosis: Unsteadiness on feet (R26.81);Other abnormalities of gait and mobility (R26.89);Repeated falls (R29.6)     Time: 8882-8003 PT Time Calculation (min) (ACUTE ONLY): 20 min  Charges:  $Gait Training: 8-22 mins                     Mabeline Caras, PT,  DPT Acute Rehabilitation Services  Personal: [facility] Office: [place] 06/08/2022, 10:30 AM

## 2022-06-09 DIAGNOSIS — I4892 Unspecified atrial flutter: Secondary | ICD-10-CM | POA: Diagnosis not present

## 2022-06-09 DIAGNOSIS — I5020 Unspecified systolic (congestive) heart failure: Secondary | ICD-10-CM

## 2022-06-09 DIAGNOSIS — I4819 Other persistent atrial fibrillation: Secondary | ICD-10-CM | POA: Diagnosis not present

## 2022-06-09 DIAGNOSIS — I483 Typical atrial flutter: Secondary | ICD-10-CM | POA: Diagnosis not present

## 2022-06-09 LAB — BASIC METABOLIC PANEL
Anion gap: 14 (ref 5–15)
BUN: 34 mg/dL — ABNORMAL HIGH (ref 8–23)
CO2: 35 mmol/L — ABNORMAL HIGH (ref 22–32)
Calcium: 9.1 mg/dL (ref 8.9–10.3)
Chloride: 91 mmol/L — ABNORMAL LOW (ref 98–111)
Creatinine, Ser: 0.73 mg/dL (ref 0.44–1.00)
GFR, Estimated: >60 mL/min (ref >60)
Glucose, Bld: 137 mg/dL — ABNORMAL HIGH (ref 70–99)
Potassium: 3.9 mmol/L (ref 3.5–5.1)
Sodium: 140 mmol/L (ref 135–145)

## 2022-06-09 LAB — GLUCOSE, CAPILLARY
Glucose-Capillary: 156 mg/dL — ABNORMAL HIGH (ref 70–99)
Glucose-Capillary: 177 mg/dL — ABNORMAL HIGH (ref 70–99)
Glucose-Capillary: 139 mg/dL — ABNORMAL HIGH (ref 70–99)
Glucose-Capillary: 201 mg/dL — ABNORMAL HIGH (ref 70–99)

## 2022-06-09 MED ORDER — FUROSEMIDE 40 MG PO TABS
40.0000 mg | ORAL_TABLET | Freq: Every day | ORAL | Status: DC
  Administered 2022-06-09 – 2022-06-11 (×3): 40 mg via ORAL
  Filled 2022-06-09 (×3): qty 1

## 2022-06-09 NOTE — Care Management Important Message (Signed)
Important Message  Patient Details  Name: [name] [name] MRN: [id number] Date of Birth: 1956-05-08   Medicare Important Message Given:  Yes     Shelda Altes 06/09/2022, 12:30 PM

## 2022-06-09 NOTE — Plan of Care (Signed)
  Problem: Health Behavior/Discharge Planning: Goal: Ability to identify and utilize available resources and services will improve Outcome: Progressing Goal: Ability to manage health-related needs will improve Outcome: Progressing   Problem: Education: Goal: Knowledge of General Education information will improve Description: Including pain rating scale, medication(s)/side effects and non-pharmacologic comfort measures Outcome: Progressing   Problem: Clinical Measurements: Goal: Respiratory complications will improve Outcome: Progressing   Problem: Clinical Measurements: Goal: Cardiovascular complication will be avoided Outcome: Progressing   Problem: Activity: Goal: Risk for activity intolerance will decrease Outcome: Progressing   Problem: Nutrition: Goal: Adequate nutrition will be maintained Outcome: Progressing   Problem: Coping: Goal: Level of anxiety will decrease Outcome: Progressing   Problem: Pain Managment: Goal: General experience of comfort will improve Outcome: Progressing   Problem: Safety: Goal: Ability to remain free from injury will improve Outcome: Progressing   Problem: Skin Integrity: Goal: Risk for impaired skin integrity will decrease Outcome: Progressing

## 2022-06-09 NOTE — Consult Note (Signed)
ELECTROPHYSIOLOGY CONSULT NOTE    Patient ID: [name] [name] MRN: [id number], DOB/AGE: [birth year] 66 y.o.  Admit date: 06/03/2022 Date of Consult: 06/09/2022  Primary Physician: [facility] Primary Cardiologist: Carlyle Dolly, MD  Electrophysiologist: New   Referring Provider: Dr. Marlou Porch  Patient Profile: [name] [name] is a 67 y.o. female with a history of O2 dependent COPD, DM2, Bipolar, HTN, AF and AFL who is being seen today for the evaluation of AFL/AF at the request of Dr. Marlou Porch.  HPI:  [name] [name] is a 67 y.o. female with medical history above admitted for URI/COPD flare complicated by Atrial flutter with RVR  Pt presented to ED 1/12 with 3 days of worsening SOB and cough. Was hypoxic in PCP clinic earlier that day and encouraged to present to ED.   On arrival to the ED was hypertensive with tachycardia into the 150s.  There has been some improvement in rate control at rest, but remains tachycardia with activity and when awake. EP asked to see for further recommendations. Of note, pt has previously been told she should not be sedated due to the severity of her COPD.   COPD exacerbation has been treated s/p ceftriazone, azithromycin, and prednisone and she is felt to be near her baseline in this regard.   Today she is feeling mostly back to baseline from breathing perspective. She is very anxious to get home. She understands her options for medical therapies are somewhat limited by her lung disease and LV dysfunction.   CXR cardiomegaly with increased vascular markings Echo (limited) 06/05/2022 with LVEF 45-50%, degenerative MV.  (Compared to 55-60% in 01/2020)  Labs Resp panel negative Potassium3.9 (01/18 0208) Creatinine, ser  0.73 (01/18 0208) PLT  150 (01/16 0221) HGB  9.9* (01/16 0221) WBC 6.3 (01/16 0221)  .    Past Medical History:  Diagnosis Date   Anxiety    Bipolar 1 disorder ([place])    Cancer (HCC)    colon   Chronic diarrhea     Followed by Dr Michail Sermon    Complication of anesthesia    Hx resp distress during surgery   COPD (chronic obstructive pulmonary disease) ([place])    Depression    Diabetes mellitus ([place])    External hemorrhoids    GI bleed    Headache(784.0)    Hyperlipidemia    Hypertension    Hypothyroidism    Insomnia    Internal hemorrhoids    Polysubstance abuse ([place]) Quit 2008    H/O methamphetamine and alcohol abuse, clean x 6 years   Shortness of breath    Suicidal thoughts    Tobacco abuse    Tubular adenoma of colon      Surgical History:  Past Surgical History:  Procedure Laterality Date   ABDOMINAL HYSTERECTOMY     APPENDECTOMY     COLON SURGERY     "colon repair"   COLONOSCOPY WITH PROPOFOL N/A 12/11/2012   Procedure: COLONOSCOPY WITH PROPOFOL;  Surgeon: Jerene Bears, MD;  Location: [facility];  Service: Gastroenterology;  Laterality: N/A;   FINGER SURGERY     middle left finger   HERNIA REPAIR     x 2   KNEE SURGERY Left    Left knee tendon surgery   TONSILLECTOMY     TUBAL LIGATION     WOUND DEBRIDEMENT Right 02/06/2020   Procedure: SKIN BIOPSY WITH DEBRIDEMENT RIGHT LOWER LEG;  Surgeon: Virl Cagey, MD;  Location: [facility];  Service: General;  Laterality: Right;  Medications Prior to Admission  Medication Sig Dispense Refill Last Dose   albuterol (PROVENTIL) (5 MG/ML) 0.5% nebulizer solution Take 2.5 mg by nebulization every 6 (six) hours as needed for wheezing or shortness of breath.   06/03/2022   amitriptyline (ELAVIL) 100 MG tablet Take 100 mg by mouth at bedtime.   Past Week   amLODipine (NORVASC) 5 MG tablet Take 5 mg by mouth daily.   06/03/2022   atorvastatin (LIPITOR) 20 MG tablet Take 20 mg by mouth at bedtime.   Past Week   benztropine (COGENTIN) 0.5 MG tablet Take 1 tablet (0.5 mg total) by mouth 2 (two) times daily. 60 tablet 0 06/03/2022   busPIRone (BUSPAR) 10 MG tablet Take 10 mg by mouth 3 (three) times daily.   06/03/2022   COMBIVENT RESPIMAT  20-100 MCG/ACT AERS respimat Inhale 2 puffs into the lungs every 6 (six) hours as needed for wheezing or shortness of breath.   06/03/2022   divalproex (DEPAKOTE) 500 MG DR tablet Take 500 mg by mouth 2 (two) times daily.   06/03/2022   HYDROcodone-acetaminophen (NORCO/VICODIN) 5-325 MG tablet Take 1 tablet by mouth 3 (three) times daily as needed for moderate pain or severe pain.   06/01/2022   lisinopril-hydrochlorothiazide (ZESTORETIC) 10-12.5 MG tablet Take 1 tablet by mouth daily.   06/03/2022   magnesium oxide (MAG-OX) 400 MG tablet Take 400 mg by mouth daily.   06/03/2022   OXYGEN Inhale 2 L into the lungs daily.   continous   QUEtiapine (SEROQUEL) 25 MG tablet Take 1 tablet (25 mg total) by mouth at bedtime. (Patient taking differently: Take 25-50 mg by mouth 3 (three) times daily. Take '25mg'$  every morning,'25mg'$  at noon and '50mg'$  at night.) 30 tablet 1 06/03/2022   sertraline (ZOLOFT) 100 MG tablet Take 200 mg by mouth daily.    06/03/2022   SPIRIVA HANDIHALER 18 MCG inhalation capsule Place 1 capsule into inhaler and inhale daily.    06/03/2022   traZODone (DESYREL) 50 MG tablet Take 50-100 mg by mouth at bedtime as needed for sleep.   Past Week   Vitamin D, Ergocalciferol, (DRISDOL) 1.25 MG (50000 UNIT) CAPS capsule Take 50,000 Units by mouth once a week. Mondays   Past Week   tiZANidine (ZANAFLEX) 4 MG tablet Take 4 mg by mouth at bedtime.      valproic acid (DEPAKENE) 250 MG capsule Take 2 capsules (500 mg total) by mouth 2 (two) times daily. (Patient not taking: Reported on 10/23/2020) 120 capsule 0     Inpatient Medications:   apixaban  5 mg Oral BID   atorvastatin  20 mg Oral QHS   benztropine  0.5 mg Oral BID   busPIRone  10 mg Oral TID   diltiazem  360 mg Oral Daily   divalproex  500 mg Oral BID   fluticasone furoate-vilanterol  1 puff Inhalation Daily   furosemide  40 mg Oral Daily   HYDROcodone-acetaminophen  1 tablet Oral TID   insulin aspart  0-20 Units Subcutaneous TID WC    levalbuterol  1.25 mg Nebulization BID   magnesium oxide  400 mg Oral Daily   metoprolol tartrate  25 mg Oral BID   polyethylene glycol  17 g Oral Daily   QUEtiapine  25 mg Oral BID   And   QUEtiapine  50 mg Oral QHS   senna-docusate  1 tablet Oral QHS   sertraline  200 mg Oral Daily   umeclidinium bromide  1 puff Inhalation Daily  Allergies:  Allergies  Allergen Reactions   Bactrim [Sulfamethoxazole-Trimethoprim] Anaphylaxis   Amoxicillin Hives   Diclofenac Tinitus    swelling in feet    Family History  Problem Relation Age of Onset   Multiple sclerosis Mother    Alcoholism Father    Depression Brother    Emphysema Maternal Grandfather        smoked   Breast cancer Neg Hx    Colon cancer Neg Hx    Esophageal cancer Neg Hx    Colon polyps Neg Hx    Stomach cancer Neg Hx    Pancreatic cancer Neg Hx    Liver disease Neg Hx      Physical Exam: Vitals:   06/09/22 0520 06/09/22 0813 06/09/22 0817 06/09/22 0818  BP: 116/66     Pulse:      Resp:      Temp:      TempSrc:      SpO2:  98% 98% 98%  Weight:      Height:        GEN- NAD, A&O x 3, normal affect HEENT: Normocephalic, atraumatic Lungs- CTAB, Normal effort.  Heart- Regular rate and rhythm, No M/G/R.  GI- Soft, NT, ND.  Extremities- No clubbing, cyanosis, or edema   Radiology/Studies: ECHOCARDIOGRAM LIMITED  Result Date: 06/05/2022    ECHOCARDIOGRAM REPORT   Patient Name:   Braiden Presutti Date of Exam: 06/05/2022 Medical Rec #:  [id number]         Height:       65.0 in Accession #:    [id number]        Weight:       244.0 lb Date of Birth:  [birth year]         BSA:          2.153 m Patient Age:    55 years          BP:           105/74 mmHg Patient Gender: F                 HR:           150 bpm. Exam Location:  Inpatient Procedure: Limited Echo and Intracardiac Opacification Agent Indications:    I48.92* Unspecified atrial flutter  History:        Patient has prior history of Echocardiogram examinations,  most                 recent 01/29/2020. COPD, Arrythmias:Atrial Flutter; Risk                 Factors:Hypertension, Diabetes and Dyslipidemia.  Sonographer:    Raquel Sarna Senior RDCS Referring Phys: Erskine Emery  Sonographer Comments: Limited to assess for EF prior to cardioversion. IMPRESSIONS  1. Limited echo to assess LVEF.  2. Left ventricular ejection fraction, by estimation, is 45 to 50%. The left ventricle has mildly decreased function. The left ventricle demonstrates global hypokinesis. There is mild concentric left ventricular hypertrophy.  3. The mitral valve is degenerative. Severe mitral annular calcification.  4. Patient is in Afib with RVR during study Comparison(s): Compared to prior study in 2021, the LVEF has dropped to 45-50% from 55-60% in the setting of Afib with RVR. FINDINGS  Left Ventricle: Left ventricular ejection fraction, by estimation, is 45 to 50%. The left ventricle has mildly decreased function. The left ventricle demonstrates global hypokinesis. Definity contrast agent was given IV to delineate the left ventricular  endocardial borders. The left  ventricular internal cavity size was normal in size. There is mild concentric left ventricular hypertrophy. Mitral Valve: The mitral valve is degenerative in appearance. There is moderate thickening of the mitral valve leaflet(s). There is moderate calcification of the mitral valve leaflet(s). Severe mitral annular calcification. Gwyndolyn Kaufman MD Electronically signed by Gwyndolyn Kaufman MD Signature Date/Time: 06/05/2022/12:14:47 PM    Final    DG CHEST PORT 1 VIEW  Result Date: 06/05/2022 CLINICAL DATA:  67 year old female with shortness of breath. EXAM: PORTABLE CHEST 1 VIEW COMPARISON:  Chest radiographs 06/04/2022 and earlier. FINDINGS: Portable AP semi upright view at 0756 hours. Cardiomegaly, new from a 2017 chest CT. Stable cardiac size and mediastinal contours. Relatively large lung volumes. Mild chronic appearing increased  interstitial markings. But improved perihilar ventilation from the previous exams. Probable mild lung scarring along the right heart border. No pneumothorax, pleural effusion, or confluent pulmonary opacity. Visualized tracheal air column is within normal limits. No acute osseous abnormality identified. Paucity of bowel gas the visible abdomen. IMPRESSION: 1. Cardiomegaly. Suspicion of chronic hyperinflation and lung scarring along the right heart border. 2. No convincing acute cardiopulmonary abnormality. Electronically Signed   By: Genevie Ann M.D.   On: 06/05/2022 08:08   DG Chest 2 View  Result Date: 06/04/2022 CLINICAL DATA:  Shortness of breath.  Pneumonia. EXAM: CHEST - 2 VIEW COMPARISON:  06/03/2022 FINDINGS: Mild cardiomegaly stable. Mild focal opacity is again seen in the right lower lung adjacent to the right heart border, suspicious for mild infiltrate. Evidence of pneumothorax or pleural effusion. Pulmonary hyperinflation and coarsening of interstitial lung markings is unchanged. Old left rib fracture deformities again noted. IMPRESSION: Mild focal opacity in right lower lung adjacent to the heart border, suspicious for mild infiltrate. COPD. Electronically Signed   By: Marlaine Hind M.D.   On: 06/04/2022 10:19   DG Chest Portable 1 View  Result Date: 06/03/2022 CLINICAL DATA:  Dyspnea, COPD exacerbation EXAM: PORTABLE CHEST 1 VIEW COMPARISON:  10/23/2020 chest radiograph. FINDINGS: Stable cardiomediastinal silhouette with borderline mild cardiomegaly. No pneumothorax. No pleural effusion. Patchy opacity obscures the right heart border, new. Healed left mid rib deformity. No overt pulmonary edema. IMPRESSION: Patchy opacity obscures the right heart border, new, cannot exclude pneumonia. Recommend follow-up PA and lateral chest radiographs to resolution. Electronically Signed   By: Ilona Sorrel M.D.   On: 06/03/2022 19:44    EKG:06/04/2022 shows AFL at 134 bpm (personally reviewed)  Baseline EKG  10/2020 shows sinus QT is ~ 460 ms  TELEMETRY: AFL 70-90s at rest/sleep, 90-110s mostly around room, occasionally higher.  (personally reviewed)  Assessment/Plan: Persistent Atrial flutter Persistent Atrial fibrillation Rate control has been variable Continue diltiazem 360 mg daily.  Continue lopressor 25 mg daily, would titrate up as tolerated.  Flecainide poor option with LV dysfunction; Would avoid multaq and sotalol as well.  Amiodarone is a poor option with severe COPD Tikosyn not a great option given that she is on multple QT prolonging agents which would need to be addressed (HCTz, Breo Ellipta, Seroquel, and Sertraline)  3. Severe COPD with acute exacerbation.  Per primary team.  O2 dependent. Near baseline now s/p ABx and steroids.   Currently our best recommendation is rate control with cardioversion, and follow up as an outpatient to discuss further candidacy for rhythm control.   She would potentially be a candidate for fib/flutter ablation if could confirm with pulmonology recommendations for sedation.   EP will see as needed while remains here. Outpatient follow up  made.   For questions or updates, please contact [place] Please consult [email or web address] for contact info under Cardiology/STEMI.  Jacalyn Lefevre, PA-C  06/09/2022 10:45 AM

## 2022-06-09 NOTE — Progress Notes (Addendum)
Daily Progress Note Intern Pager: [phone]  Patient name: [name] [name] Medical record number: [id number] Date of birth: 09/11/1955 Age: 67 y.o. Gender: female  Primary Care Provider: [facility] [place] Consultants: Cardiology Code Status: Full  Pt Overview and Major Events to Date:  1/12 - Admitted 1/14 - Found to be in a flutter, increased diltiazem per cardiology 1/16 - Transitioned to long acting diltiazem, IV Lasix started 1/17 - HR elevated again, metoprolol 25 mg BID added 1/18 - HR still elevated, TEE guided cardioversion planned per cardiology  Assessment and Plan: Kelvin Burpee is a 67 y.o. female presenting with COPD exacerbation and superimposed CAP, found to be in persistent atrial flutter with RVR and now finished with CAP treatment.   Pertinent PMH/PSH includes COPD on 2L at home, T2DM, bipolar 1 disorder, HTN, and past episode of resolved afib during illness.  * Atrial flutter ([place]) Patient is still in A-flutter on diltiazem, HR ranging from 90-120, but spiked to 150 and remained elevated yesterday afternoon, requiring metoprolol addition per cardiology despite COPD exacerbation concern.  HR remains elevated today, cardiology planning TEE guided cardioversion prior to discharge, currently scheduling.  Patient currently feels well overall. - Cardiology consulted, appreciate recs: - Cardizem 360 mg PO daily - Continue apixaban 5 mg twice daily - Poor candidate for amiodarone d/t respiratory disease and current respiratory status - Lasix 40 mg PO daily for edema - Metoprolol 25 mg BID given persistent HR - HR still elevated today, planning TEE guided cardioversion soon; patient apprehensive, but appears necessary - Repeat echo in 3 months to check EF on rate control - Looking at [place] and electrophysiology consult in future  Acute hypoxic respiratory failure ([place]) Treating for for COPD exacerbation, finished treatment for CAP.  Patient with O2  saturation in mid 90s on 3L  when ambulating with some work of breathing and reported orthopnea.  Generally back to baseline.  At home, patient takes Spiriva inhaler, will replace with Trelegy on discharge for triple therapy given extent of patient's respiratory disease. - Xopenex (levabuterol) Q6H scheduled - Duonebs Q6H as needed - Tylenol prn for pain - Continue Incruse Ellipta daily (LAMA) and Breo Ellipta (ICS+LABA) - s/p CTX IV (5 days), azithromycin IV (3 days), and prednisone '40mg'$  PO (4 days)  Urinary retention Patient had I/O cath on day of admission but has since had multiple episodes of spontaneous urination.  No suprapubic TTP or fullness on exam.  Net -875 mL output yesterday, continuing to diurese. - Strict I/O, continue to monitor - Bladder scan if not urinating, I&O >200 mL, if I&O x 3 start foley catheter  Non-insulin dependent type 2 diabetes mellitus ([place]) Patient w/ DM2, not on insulin. A1c at 6.7 on 06/03/22.  Required 18 units aspart in past 24 hours.  Glucose 139 this AM and generally stable. Regimen does not require changes at this time, will CTM. - CBG check ACHS - mSSI - Carb modified diet  Chronic pain Has chronic knee and hip pain due to OA.  Home medication includes Norco 3 times daily as needed which patient states she takes 3 times daily regularly. - Continue home Norco 3 times daily  FEN/GI: Carb modified diet PPx: Heparin IM Dispo: Home with DME ([facility] refused) pending clinical improvement . Barriers include elevated heart rate and diuresis.  Subjective:  This morning, patient states that her breathing is "the best it has ever been."  She believes she is back to better than her baseline and has been  ambulating around the room well.  She affirms that she does not want home health physical therapy because she feels like her home is "wide open."  Remains sure of this decision when discussing falls risk and promises to use walker appropriately at home.   Understands that she will be switching to Trelegy inhaler at home and will need to follow-up with pulmonology outpatient.  Objective: Temp:  [97.9 F (36.6 C)-99.3 F (37.4 C)] 97.9 F (36.6 C) (01/18 0508) Pulse Rate:  [67-146] 67 (01/18 0508) Resp:  [18-19] 18 (01/18 0508) BP: (91-129)/(51-78) 116/66 (01/18 0520) SpO2:  [90 %-98 %] 98 % (01/18 0818)  Physical Exam: General: Resting comfortably in bed, NAD, alert and at baseline. Cardiovascular: Tachycardic, occasional irregular beats. Normal S1/S2. No murmurs, rubs, or gallops appreciated. 2+ radial pulses. Pulmonary: Reduced breath sounds due to body habitus. No increased WOB on 3L O2. No wheezes, rales, or crackles. Abdominal: No tenderness to deep or light palpation. No rebound or guarding. No HSM. Extremities: 1+ pitting edema at ankles bilaterally.  Laboratory: Most recent CBC Lab Results  Component Value Date   WBC 6.3 06/07/2022   HGB 9.9 (L) 06/07/2022   HCT 32.7 (L) 06/07/2022   MCV 89.3 06/07/2022   PLT 150 06/07/2022   Most recent BMP    Latest Ref Rng & Units 06/09/2022    2:08 AM  BMP  Glucose 70 - 99 mg/dL 137   BUN 8 - 23 mg/dL 34   Creatinine 0.44 - 1.00 mg/dL 0.73   Sodium 135 - 145 mmol/L 140   Potassium 3.5 - 5.1 mmol/L 3.9   Chloride 98 - 111 mmol/L 91   CO2 22 - 32 mmol/L 35   Calcium 8.9 - 10.3 mg/dL 9.1    Imaging/Diagnostic Tests: - None new  Shitarev, Dimitry, Medical Student 06/09/2022, 11:25 AM  MS4, [facility] S[facility], [facility] Intern pager: [phone], text pages welcome Secure chat group [facility]    I was personally present and re-performed the exam and medical decision making and verified the service and findings are accurately documented in the student's note.  Precious Gilding, DO 06/09/2022 2:05 PM

## 2022-06-09 NOTE — Evaluation (Signed)
Occupational Therapy Treatment Patient Details Name: [name] [name] MRN: [id number] DOB: 1956-01-08 Today's Date: 06/09/2022   History of present illness Pt is a 67 y.o. female presenting 06/03/22 with 3-day h/o progressive SOB and chest pain. Workup for COPD exacerbation, CAP, aflutter. PMH includes afib, COPD (2 L O2 baseline), DM2, HTN, HLD, chronic pain, bipolar 1 disorder.   OT comments  Pt was seen for OT ADL retraining today with focus on functional mobility in room, toilet transfer and pt education. Discussed use of shower chair at home for safety when bathing, pt and daughter verbalized understanding and stated that shower chair had already been delivered. Pt was Min A assist with inital sit to stand from EOB and Min guard-supervision for RW use with vc's for sequencing into bathroom and on/off toilet using grab bar and RW for support. Pt should benefit from continued acute OT to assist in maximizing independence with ADL's, functional transfers and pt education for energy conservation.   Recommendations for follow up therapy are one component of a multi-disciplinary discharge planning process, led by the attending physician.  Recommendations may be updated based on patient status, additional functional criteria and insurance authorization.    Follow Up Recommendations  No OT follow up     Assistance Recommended at Discharge PRN  Patient can return home with the following  A little help with walking and/or transfers;A little help with bathing/dressing/bathroom;Assistance with cooking/housework;Help with stairs or ramp for entrance   Equipment Recommendations  Tub/shower seat (Recommend shower chair secondary to pt lives alone with w/ h/o falls and hip pain. Pt reports 3:1 will not fit in tub. 06/09/22: Pt reports shower chair was delivered)    Recommendations for Other Services      Precautions / Restrictions Precautions Precautions: Fall;Other (comment) Precaution Comments:  watch SpO2 (wears 2L O2 baseline)       Mobility Bed Mobility   General bed mobility comments: received sitting up at EOB    Transfers Overall transfer level: Needs assistance Equipment used: Rolling walker (2 wheels) Transfers: Sit to/from Stand Sit to Stand: Min guard   Step pivot transfers: Min guard    Balance Overall balance assessment: Needs assistance Sitting-balance support: No upper extremity supported, Feet supported Sitting balance-Leahy Scale: Good   Standing balance support: No upper extremity supported, During functional activity Standing balance-Leahy Scale: Fair Standing balance comment: can take steps without DME, stability improved with RW     ADL either performed or assessed with clinical judgement   ADL Overall ADL's : Needs assistance/impaired     Grooming: Wash/dry hands;Min guard;Standing Grooming Details (indicate cue type and reason): Standing at sink in bathroom   Toilet Transfer: Supervision/safety;Regular Toilet;Rolling walker (2 wheels);Ambulation;Cueing for sequencing;Min guard Toilet Transfer Details (indicate cue type and reason): Pt ambulated around bed into bathroom and on/off toilet using RW with supervision/min guard assist & vc's for sequencing with RW Toileting- Clothing Manipulation and Hygiene: Modified independent;Sitting/lateral lean;Min guard;Sit to/from stand Toileting - Clothing Manipulation Details (indicate cue type and reason): Min guard with sit to stand from toilet   Tub/Shower Transfer Details (indicate cue type and reason): Pt education re: using shower chair at home for safety when bathing. Functional mobility during ADLs: Supervision/safety;Min guard;Cueing for sequencing;Rolling walker (2 wheels) General ADL Comments: Pt was seen for OT ADL retraining today with focus on functional mobility in room, toilet transfer and pt education. Discussed use of shower chair at home for safety when bathing, pt and daughter verbalized  understanding and stated that  shower chair had already been delivered. Pt was Min A assist with inital sit to stand from EOB and Min guard-supervision for RW use with vc's for sequencing into bathroom and on/off toilet using grab bar and RW for support. Pt should benefit from continued acute OT to assist in maximizing independence with ADL's, functional transfers and pt education for energy conservation.    Extremity/Trunk Assessment Upper Extremity Assessment Upper Extremity Assessment: Generalized weakness;Overall WFL for tasks assessed   Lower Extremity Assessment Lower Extremity Assessment: Defer to PT evaluation   Cervical / Trunk Assessment Cervical / Trunk Assessment: Other exceptions Cervical / Trunk Exceptions: overweight    Vision Baseline Vision/History: 1 Wears glasses (reading) Ability to See in Adequate Light: 0 Adequate Patient Visual Report: No change from baseline Vision Assessment?: No apparent visual deficits          Cognition Arousal/Alertness: Awake/alert Behavior During Therapy: WFL for tasks assessed/performed Overall Cognitive Status: Within Functional Limits for tasks assessed              General Comments SpO2 89-95% on 3L O2  though difficulty getting reliable pulse ox reading. HR up to 110 with activity.    Pertinent Vitals/ Pain       Pain Assessment Pain Assessment: No/denies pain  Home Living Family/patient expects to be discharged to:: Private residence Living Arrangements: Alone Available Help at Discharge: Family;Available PRN/intermittently Type of Home: House Home Access: Ramped entrance   Home Layout: One level   Bathroom Shower/Tub: Tub/shower unit   Home Equipment: Conservation officer, nature (2 wheels);Grab bars - tub/shower;BSC/3in1;Cane - single point      Prior Functioning/Environment  Lives alone with family near by and able to provide PRN assist. Please refer to initial OT Eval for details   Frequency  Min 2X/week         Progress Toward Goals  OT Goals(current goals can now be found in the care plan section)   Progressing  Acute Rehab OT Goals Patient Stated Goal: Get "precedure tomorrow" to get HR "under control" OT Goal Formulation: With patient Time For Goal Achievement: 06/22/22 Potential to Achieve Goals: Good  Plan   Acute OT      AM-PAC OT "6 Clicks" Daily Activity     Outcome Measure   Help from another person eating meals?: None Help from another person taking care of personal grooming?: A Little Help from another person toileting, which includes using toliet, bedpan, or urinal?: A Little Help from another person bathing (including washing, rinsing, drying)?: A Little Help from another person to put on and taking off regular upper body clothing?: None Help from another person to put on and taking off regular lower body clothing?: A Little 6 Click Score: 20    End of Session Equipment Utilized During Treatment: Rolling walker (2 wheels);Oxygen  OT Visit Diagnosis: History of falling (Z91.81);Unsteadiness on feet (R26.81)   Activity Tolerance Patient tolerated treatment well   Patient Left in chair;with call bell/phone within reach;with family/visitor present   Nurse Communication Mobility status;Other (comment) (Pt sitting up in chair with daughter present; bed linens changed)        Time: 9702-6378 OT Time Calculation (min): 21 min  Charges: OT General Charges $OT Visit: 1 Visit OT Treatments $Self Care/Home Management : 8-22 mins   [place], Marianne, OTR/L 06/09/2022, 12:09 PM

## 2022-06-09 NOTE — Progress Notes (Signed)
Mobility Specialist - Progress Note   06/09/22 1603  Mobility  Activity Ambulated with assistance in room  Level of Assistance Moderate assist, patient does 50-74%  Assistive Device Front wheel walker  Distance Ambulated (ft) 20 ft  Activity Response Tolerated fair  Mobility Referral Yes  $Mobility charge 1 Mobility   Pt was received in chair and agreeable to mobility. Pt c/o SOB toward end of session. Pt ambulation on 3L O2. Pt was returned to chair with all needs met.   Franki Monte  Mobility Specialist Please contact via Solicitor or Rehab office at [phone]

## 2022-06-09 NOTE — Progress Notes (Signed)
Refused Bipap 

## 2022-06-09 NOTE — Progress Notes (Addendum)
Rounding Note    Patient Name: [name] [name] Date of Encounter: 06/09/2022  [place] Cardiologist: Carlyle Dolly, MD   Subjective   Patient reports feeling much better today. Denies chest pain, palpitations. Breathing is back to baseline and cough has improved. No orthopnea. Ankle edema improved. Looking forward to going home today   Inpatient Medications    Scheduled Meds:  apixaban  5 mg Oral BID   atorvastatin  20 mg Oral QHS   benztropine  0.5 mg Oral BID   busPIRone  10 mg Oral TID   diltiazem  360 mg Oral Daily   divalproex  500 mg Oral BID   fluticasone furoate-vilanterol  1 puff Inhalation Daily   furosemide  40 mg Intravenous BID   HYDROcodone-acetaminophen  1 tablet Oral TID   insulin aspart  0-20 Units Subcutaneous TID WC   levalbuterol  1.25 mg Nebulization BID   magnesium oxide  400 mg Oral Daily   metoprolol tartrate  25 mg Oral BID   polyethylene glycol  17 g Oral Daily   QUEtiapine  25 mg Oral BID   And   QUEtiapine  50 mg Oral QHS   senna-docusate  1 tablet Oral QHS   sertraline  200 mg Oral Daily   umeclidinium bromide  1 puff Inhalation Daily   Continuous Infusions:  PRN Meds: acetaminophen **OR** acetaminophen, ipratropium-albuterol, nicotine   Vital Signs    Vitals:   06/09/22 0520 06/09/22 0813 06/09/22 0817 06/09/22 0818  BP: 116/66     Pulse:      Resp:      Temp:      TempSrc:      SpO2:  98% 98% 98%  Weight:      Height:        Intake/Output Summary (Last 24 hours) at 06/09/2022 0912 Last data filed at 06/08/2022 2000 Gross per 24 hour  Intake 480 ml  Output 2100 ml  Net -1620 ml      06/03/2022    7:33 PM 02/22/2020    8:17 PM 02/22/2020    6:48 AM  Last 3 Weights  Weight (lbs) 244 lb 195 lb 1.7 oz 198 lb 13.7 oz  Weight (kg) 110.678 kg 88.5 kg 90.2 kg      Telemetry    Atrial Flutter. HR 70s overnight, brief increase to the 130s this AM. Now 100-110 - Personally Reviewed  ECG    No new  tracings  - Personally Reviewed  Physical Exam   GEN: No acute distress.  Sitting comfortably on the side of the bed eating breakfast  Neck: No JVD Cardiac: Irregular rate and rhythm. Grade 2/6 systolic murmur  Respiratory: Inspiratory and expiratory murmur heard throughout  GI: Soft, nontender, non-distended  MS: No edema in BLE; No deformity. Neuro:  Nonfocal  Psych: Normal affect   Labs    High Sensitivity Troponin:   Recent Labs  Lab 06/03/22 1925 06/03/22 2125 06/04/22 1937 06/04/22 2141  TROPONINIHS '10 9 9 10     '$ Chemistry Recent Labs  Lab 06/04/22 0530 06/04/22 0549 06/05/22 0150 06/06/22 0215 06/07/22 0221 06/08/22 0237 06/09/22 0208  NA 137   < > 138   < > 141 138 140  K 4.4   < > 4.7   < > 4.7 4.3 3.9  CL 94*  --  96*   < > 98 91* 91*  CO2 34*  --  32   < > 35* 32 35*  GLUCOSE 215*  --  153*   < > 131* 143* 137*  BUN 13  --  27*   < > 29* 32* 34*  CREATININE 0.65  --  0.73   < > 0.65 0.68 0.73  CALCIUM 8.8*  --  8.6*   < > 9.2 8.9 9.1  MG 2.0  --  2.2  --   --   --   --   PROT 6.6  --  6.4*  --   --   --   --   ALBUMIN 3.4*  --  3.4*  --   --   --   --   AST 14*  --  13*  --   --   --   --   ALT 12  --  11  --   --   --   --   ALKPHOS 65  --  56  --   --   --   --   BILITOT 0.4  --  0.2*  --   --   --   --   GFRNONAA >60  --  >60   < > >60 >60 >60  ANIONGAP 9  --  10   < > '8 15 14   '$ < > = values in this interval not displayed.    Lipids No results for input(s): "CHOL", "TRIG", "HDL", "LABVLDL", "LDLCALC", "CHOLHDL" in the last 168 hours.  Hematology Recent Labs  Lab June 16, 2022 0150 06/06/22 0215 06/07/22 0221  WBC 7.3 6.2 6.3  RBC 3.56* 3.41* 3.66*  HGB 9.4* 9.2* 9.9*  HCT 31.5* 31.1* 32.7*  MCV 88.5 91.2 89.3  MCH 26.4 27.0 27.0  MCHC 29.8* 29.6* 30.3  RDW 15.6* 15.6* 15.3  PLT 149* 137* 150   Thyroid  Recent Labs  Lab 06/04/22 0530  TSH 0.873    BNP Recent Labs  Lab 06/04/22 1916  BNP 435.8*    DDimer No results for input(s):  "DDIMER" in the last 168 hours.   Radiology    No results found.  Cardiac Studies   Limited Echocardiogram 06-16-22 1. Limited echo to assess LVEF.   2. Left ventricular ejection fraction, by estimation, is 45 to 50%. The  left ventricle has mildly decreased function. The left ventricle  demonstrates global hypokinesis. There is mild concentric left ventricular  hypertrophy.   3. The mitral valve is degenerative. Severe mitral annular calcification.   4. Patient is in Afib with RVR during study   Patient Profile     67 y.o. female with past medical history of home O2 dependent COPD, diabetes mellitus, bipolar disorder, hypertension, atrial fibrillation for evaluation of atrial flutter in the setting of COPD flare/upper respiratory infection.  Last echocardiogram 2021 showed normal LV function.   Assessment & Plan    Paroxysmal Atrial Flutter HFmrEF  - Patient presented to the ED on 1/12 complaining of SOB, cough. Admitted for treatment of COPD and CAP. Found to be in atrial flutter, HR elevated to the 150s.  - Echocardiogram showed EF 45-50% (down from 55-60% in 2021). Suspect this is tachy induced CM in the setting of aflutter with RVR  - Continue cardizem CD 360 mg daily  - Yesterday, patient had HR elevated to the 150s for about 2 hours. Started metoprolol 25 mg BID. Overnight, Hr was around 70 BPM. This AM, HR increased slightly to 100-110 BPM (has not received daily diltiazem or metoprolol yet).  - Patient has severe COPD, so would like to avoid amiodarone. Likely would not  be a good candidate for flecainide due to reduced EF and valvular calcifications. Also likely not a candidate for multaq.  - As an outpatient, could consider DCCV after 3 weeks uninterrupted DOAC. However, patient has been told she cannot go under anesthesia due to advanced COPD. This makes her a poor candidate for ablation in the future  - May need EP involvement/tikosyn in the future  - Continue eliquis 5 mg  BID  - Patient has been receiving IV lasix 40 mg BID-- output 2.1 L urine yesterday. Renal function stable  - Stop IV lasix. Start PO lasix 40 mg daily. Offered to send her home on PRN lasix, but patient would prefer daily dosing  - Plan to repeat echocardiogram in about 3 months to recheck EF after adequate rate control    Mitral Valve Calcification  - Limited echo this admission showed that the mitral valve was degenerative, severe mitral annular calcification - Previous echo in 2021 showed moderate mitral annular calcification, trivial MR - Follow as an outpatient    Otherwise per primary  - COPD exacerbation, CAP  - Urinary Retention  - Type 2 DM  - Chronic Pain   For questions or updates, please contact [place] Please consult [email or web address] for contact info under        Signed, Margie Billet, PA-C  06/09/2022, 9:12 AM    Personally seen and examined. Agree with above.  67 year old with newly discovered atrial flutter, appears typical, with oxygen dependent COPD.  During sleep heart rate is well-controlled however when she is awake she becomes rapid once again due to increased catecholamine anergic response.  We are struggling with diltiazem as well as metoprolol for rate control..    EF 45 to 50%  -Discussed with her TEE cardioversion.  I think this makes sense for her given that we are unable to control her heart rate given the incessant flutter.  We will ask EP for opinion as well as possible ablation of atrial flutter may be an option.  Of course given her COPD, procedures in general are not ideal however I am not sure we have another option because at this rate, she may develop significant tachycardia induced cardiomyopathy.  Spoke to her daughter at length about TEE cardioversion, potential risks of procedure, potential transient intubation secondary to respiratory status.  I expressed to her that if we continue with atrial flutter and cannot control with  a heart rate, this may weaken her heart and could cause further morbidity and potential mortality in the future.  I think that it is worth the potential risks to perform a TEE cardioversion.  She understands.  She did tell me that previously her father had a liver procedure done here and died on the table.  She also told me that 2 years ago her mother was in intensive care on a ventilator.  Obviously she is concerned.  Candee Furbish, MD

## 2022-06-10 ENCOUNTER — Inpatient Hospital Stay (HOSPITAL_COMMUNITY): Payer: Medicare Other | Admitting: Anesthesiology

## 2022-06-10 ENCOUNTER — Inpatient Hospital Stay (HOSPITAL_COMMUNITY): Payer: Medicare Other

## 2022-06-10 ENCOUNTER — Other Ambulatory Visit (HOSPITAL_COMMUNITY): Payer: Self-pay

## 2022-06-10 ENCOUNTER — Encounter (HOSPITAL_COMMUNITY): Payer: Self-pay | Admitting: Family Medicine

## 2022-06-10 ENCOUNTER — Encounter (HOSPITAL_COMMUNITY)
Admission: EM | Disposition: A | Discharge status: Home / Self Care | Payer: Self-pay | Source: Home / Self Care | Attending: Family Medicine

## 2022-06-10 DIAGNOSIS — I350 Nonrheumatic aortic (valve) stenosis: Secondary | ICD-10-CM

## 2022-06-10 DIAGNOSIS — Q2112 Patent foramen ovale: Secondary | ICD-10-CM

## 2022-06-10 DIAGNOSIS — I10 Essential (primary) hypertension: Secondary | ICD-10-CM

## 2022-06-10 DIAGNOSIS — D638 Anemia in other chronic diseases classified elsewhere: Secondary | ICD-10-CM

## 2022-06-10 DIAGNOSIS — J441 Chronic obstructive pulmonary disease with (acute) exacerbation: Secondary | ICD-10-CM | POA: Diagnosis not present

## 2022-06-10 DIAGNOSIS — I4892 Unspecified atrial flutter: Secondary | ICD-10-CM | POA: Diagnosis not present

## 2022-06-10 DIAGNOSIS — I5022 Chronic systolic (congestive) heart failure: Secondary | ICD-10-CM | POA: Diagnosis not present

## 2022-06-10 DIAGNOSIS — I483 Typical atrial flutter: Secondary | ICD-10-CM | POA: Diagnosis not present

## 2022-06-10 DIAGNOSIS — I340 Nonrheumatic mitral (valve) insufficiency: Secondary | ICD-10-CM | POA: Diagnosis not present

## 2022-06-10 DIAGNOSIS — J159 Unspecified bacterial pneumonia: Secondary | ICD-10-CM | POA: Diagnosis not present

## 2022-06-10 DIAGNOSIS — E039 Hypothyroidism, unspecified: Secondary | ICD-10-CM

## 2022-06-10 DIAGNOSIS — J449 Chronic obstructive pulmonary disease, unspecified: Secondary | ICD-10-CM

## 2022-06-10 DIAGNOSIS — F17200 Nicotine dependence, unspecified, uncomplicated: Secondary | ICD-10-CM

## 2022-06-10 DIAGNOSIS — I5020 Unspecified systolic (congestive) heart failure: Secondary | ICD-10-CM | POA: Diagnosis not present

## 2022-06-10 HISTORY — DX: Unspecified bacterial pneumonia: J15.9

## 2022-06-10 HISTORY — PX: CARDIOVERSION: SHX1299

## 2022-06-10 HISTORY — DX: Chronic systolic (congestive) heart failure: I50.22

## 2022-06-10 HISTORY — PX: TEE WITHOUT CARDIOVERSION: SHX5443

## 2022-06-10 HISTORY — PX: BUBBLE STUDY: SHX6837

## 2022-06-10 LAB — BASIC METABOLIC PANEL
Anion gap: 11 (ref 5–15)
BUN: 38 mg/dL — ABNORMAL HIGH (ref 8–23)
CO2: 34 mmol/L — ABNORMAL HIGH (ref 22–32)
Calcium: 9.4 mg/dL (ref 8.9–10.3)
Chloride: 96 mmol/L — ABNORMAL LOW (ref 98–111)
Creatinine, Ser: 0.84 mg/dL (ref 0.44–1.00)
GFR, Estimated: >60 mL/min (ref >60)
Glucose, Bld: 170 mg/dL — ABNORMAL HIGH (ref 70–99)
Potassium: 3.8 mmol/L (ref 3.5–5.1)
Sodium: 141 mmol/L (ref 135–145)

## 2022-06-10 LAB — GLUCOSE, CAPILLARY
Glucose-Capillary: 164 mg/dL — ABNORMAL HIGH (ref 70–99)
Glucose-Capillary: 137 mg/dL — ABNORMAL HIGH (ref 70–99)
Glucose-Capillary: 86 mg/dL (ref 70–99)
Glucose-Capillary: 265 mg/dL — ABNORMAL HIGH (ref 70–99)

## 2022-06-10 LAB — ECHO TEE
AV Mean grad: 11.2 mmHg
AV Peak grad: 18.2 mmHg
Ao pk vel: 2.13 m/s
MV M vel: 5.13 m/s
MV Peak grad: 105.3 mmHg

## 2022-06-10 LAB — PROTIME-INR
INR: 1.4 — ABNORMAL HIGH (ref 0.8–1.2)
Prothrombin Time: 16.9 seconds — ABNORMAL HIGH (ref 11.4–15.2)

## 2022-06-10 SURGERY — ECHOCARDIOGRAM, TRANSESOPHAGEAL
Anesthesia: General

## 2022-06-10 MED ORDER — PHENYLEPHRINE 80 MCG/ML (10ML) SYRINGE FOR IV PUSH (FOR BLOOD PRESSURE SUPPORT)
PREFILLED_SYRINGE | INTRAVENOUS | Status: DC | PRN
  Administered 2022-06-10: 80 ug via INTRAVENOUS

## 2022-06-10 MED ORDER — TRELEGY ELLIPTA 100-62.5-25 MCG/ACT IN AEPB
1.0000 | INHALATION_SPRAY | Freq: Every day | RESPIRATORY_TRACT | 0 refills | Status: DC
  Filled 2022-06-10: qty 60, 30d supply, fill #0

## 2022-06-10 MED ORDER — BUTAMBEN-TETRACAINE-BENZOCAINE 2-2-14 % EX AERO
INHALATION_SPRAY | CUTANEOUS | Status: DC | PRN
  Administered 2022-06-10: 2 via TOPICAL

## 2022-06-10 MED ORDER — SODIUM CHLORIDE 0.9 % IV SOLN: INTRAVENOUS | Status: DC

## 2022-06-10 MED ORDER — FUROSEMIDE 40 MG PO TABS
40.0000 mg | ORAL_TABLET | Freq: Every day | ORAL | 0 refills | Status: AC
  Filled 2022-06-10: qty 30, 30d supply, fill #0

## 2022-06-10 MED ORDER — DILTIAZEM HCL ER COATED BEADS 180 MG PO CP24
360.0000 mg | ORAL_CAPSULE | Freq: Every day | ORAL | 1 refills | Status: DC
  Filled 2022-06-10: qty 60, 30d supply, fill #0

## 2022-06-10 MED ORDER — PROPOFOL 500 MG/50ML IV EMUL
INTRAVENOUS | Status: DC | PRN
  Administered 2022-06-10: 75 ug/kg/min via INTRAVENOUS

## 2022-06-10 MED ORDER — MIDAZOLAM HCL 5 MG/5ML IJ SOLN
INTRAMUSCULAR | Status: DC | PRN
  Administered 2022-06-10: 2 mg via INTRAVENOUS

## 2022-06-10 MED ORDER — PHENYLEPHRINE HCL-NACL 20-0.9 MG/250ML-% IV SOLN
INTRAVENOUS | Status: DC | PRN
  Administered 2022-06-10: 30 ug/min via INTRAVENOUS

## 2022-06-10 MED ORDER — APIXABAN 5 MG PO TABS
5.0000 mg | ORAL_TABLET | Freq: Two times a day (BID) | ORAL | 2 refills | Status: DC
  Filled 2022-06-10: qty 60, 30d supply, fill #0

## 2022-06-10 MED ORDER — PROPOFOL 10 MG/ML IV BOLUS
INTRAVENOUS | Status: DC | PRN
  Administered 2022-06-10 (×3): 20 mg via INTRAVENOUS

## 2022-06-10 NOTE — Anesthesia Preprocedure Evaluation (Signed)
Anesthesia Evaluation  Patient identified by MRN, date of birth, ID band Patient awake    Reviewed: Allergy & Precautions, H&P , NPO status , Patient's Chart, lab work & pertinent test results  Airway Mallampati: II  TM Distance: >3 FB Neck ROM: Full    Dental no notable dental hx.    Pulmonary COPD,  oxygen dependent, Current Smoker and Patient abstained from smoking.   breath sounds clear to auscultation + decreased breath sounds      Cardiovascular hypertension, Normal cardiovascular exam+ dysrhythmias Atrial Fibrillation  Rhythm:Irregular Rate:Normal  1. Limited echo to assess LVEF.   2. Left ventricular ejection fraction, by estimation, is 45 to 50%. The  left ventricle has mildly decreased function. The left ventricle  demonstrates global hypokinesis. There is mild concentric left ventricular  hypertrophy.   3. The mitral valve is degenerative. Severe mitral annular calcification.   4. Patient is in Afib with RVR during study   Comparison(s): Compared to prior study in 2021, the LVEF has dropped to  45-50% from 55-60% in the setting of Afib with RVR.   FINDINGS   Left Ventricle: Left ventricular ejection fraction, by estimation, is 45  to 50%. The left ventricle has mildly decreased function. The left  ventricle demonstrates global hypokinesis. Definity contrast agent was  given IV to delineate the left ventricular   endocardial borders. The left ventricular internal cavity size was normal  in size. There is mild concentric left ventricular hypertrophy.   Mitral Valve: The mitral valve is degenerative in appearance. There is  moderate thickening of the mitral valve leaflet(s). There is moderate  calcification of the mitral valve leaflet(s). Severe mitral annular  calcification.     Neuro/Psych negative neurological ROS  negative psych ROS   GI/Hepatic negative GI ROS, Neg liver ROS,,,  Endo/Other   diabetesHypothyroidism    Renal/GU negative Renal ROS  negative genitourinary   Musculoskeletal negative musculoskeletal ROS (+)    Abdominal   Peds negative pediatric ROS (+)  Hematology  (+) Blood dyscrasia, anemia   Anesthesia Other Findings   Reproductive/Obstetrics negative OB ROS                             Anesthesia Physical Anesthesia Plan  ASA: 3  Anesthesia Plan: General   Post-op Pain Management: Minimal or no pain anticipated   Induction: Intravenous  PONV Risk Score and Plan: 2 and Propofol infusion and Treatment may vary due to age or medical condition  Airway Management Planned: Mask  Additional Equipment:   Intra-op Plan:   Post-operative Plan:   Informed Consent: I have reviewed the patients History and Physical, chart, labs and discussed the procedure including the risks, benefits and alternatives for the proposed anesthesia with the patient or authorized representative who has indicated his/her understanding and acceptance.     Dental advisory given  Plan Discussed with: CRNA and Surgeon  Anesthesia Plan Comments:        Anesthesia Quick Evaluation

## 2022-06-10 NOTE — Progress Notes (Addendum)
Rounding Note    Patient Name: [name] [name] Date of Encounter: 06/10/2022  [place] Cardiologist: Carlyle Dolly, MD   Subjective   She is feeling well, unaware of her a flutter today, no chest pain, dizziness, palpitation. She states she had agreed with TEE cardioversion with Dr Marlou Porch yesterday. She has tolerated anesthesia in the past, states "as long as it's not too long".   Inpatient Medications    Scheduled Meds:  apixaban  5 mg Oral BID   atorvastatin  20 mg Oral QHS   benztropine  0.5 mg Oral BID   busPIRone  10 mg Oral TID   diltiazem  360 mg Oral Daily   divalproex  500 mg Oral BID   fluticasone furoate-vilanterol  1 puff Inhalation Daily   furosemide  40 mg Oral Daily   HYDROcodone-acetaminophen  1 tablet Oral TID   insulin aspart  0-20 Units Subcutaneous TID WC   levalbuterol  1.25 mg Nebulization BID   magnesium oxide  400 mg Oral Daily   metoprolol tartrate  25 mg Oral BID   polyethylene glycol  17 g Oral Daily   QUEtiapine  25 mg Oral BID   And   QUEtiapine  50 mg Oral QHS   senna-docusate  1 tablet Oral QHS   sertraline  200 mg Oral Daily   umeclidinium bromide  1 puff Inhalation Daily   Continuous Infusions:  PRN Meds: acetaminophen **OR** acetaminophen, ipratropium-albuterol, nicotine   Vital Signs    Vitals:   06/09/22 2200 06/09/22 2339 06/10/22 0524 06/10/22 0759  BP:  (!) 104/43 106/65 (!) 94/59  Pulse:  70 69 70  Resp:  '19 19 20  '$ Temp:  97.8 F (36.6 C) 98 F (36.7 C) 97.9 F (36.6 C)  TempSrc:  Oral Oral Oral  SpO2: 93% 92% 95% 93%  Weight:      Height:        Intake/Output Summary (Last 24 hours) at 06/10/2022 0824 Last data filed at 06/10/2022 0529 Gross per 24 hour  Intake 240 ml  Output 2200 ml  Net -1960 ml      06/03/2022    7:33 PM 02/22/2020    8:17 PM 02/22/2020    6:48 AM  Last 3 Weights  Weight (lbs) 244 lb 195 lb 1.7 oz 198 lb 13.7 oz  Weight (kg) 110.678 kg 88.5 kg 90.2 kg      Telemetry     Atrial flutter with variable block, ventricular rate of 70s - Personally Reviewed  ECG    No new tracings today  - Personally Reviewed  Physical Exam   GEN: No acute distress. Laying.  Neck: No JVD Cardiac: Irregular rate and rhythm. Grade 2/6 systolic murmur  Respiratory: No wheezing, lung clear, diminished at base, on 3LNC , pox 95% GI: Soft, nontender, non-distended  MS: Trace edema of BLE; No deformity. Neuro:  Nonfocal  Psych: Normal affect   Labs    High Sensitivity Troponin:   Recent Labs  Lab 06/03/22 1925 06/03/22 2125 06/04/22 1937 06/04/22 2141  TROPONINIHS '10 9 9 10     '$ Chemistry Recent Labs  Lab 06/04/22 0530 06/04/22 0549 06/05/22 0150 06/06/22 0215 06/08/22 0237 06/09/22 0208 06/10/22 0315  NA 137   < > 138   < > 138 140 141  K 4.4   < > 4.7   < > 4.3 3.9 3.8  CL 94*  --  96*   < > 91* 91* 96*  CO2 34*  --  32   < > 32 35* 34*  GLUCOSE 215*  --  153*   < > 143* 137* 170*  BUN 13  --  27*   < > 32* 34* 38*  CREATININE 0.65  --  0.73   < > 0.68 0.73 0.84  CALCIUM 8.8*  --  8.6*   < > 8.9 9.1 9.4  MG 2.0  --  2.2  --   --   --   --   PROT 6.6  --  6.4*  --   --   --   --   ALBUMIN 3.4*  --  3.4*  --   --   --   --   AST 14*  --  13*  --   --   --   --   ALT 12  --  11  --   --   --   --   ALKPHOS 65  --  56  --   --   --   --   BILITOT 0.4  --  0.2*  --   --   --   --   GFRNONAA >60  --  >60   < > >60 >60 >60  ANIONGAP 9  --  10   < > '15 14 11   '$ < > = values in this interval not displayed.    Lipids No results for input(s): "CHOL", "TRIG", "HDL", "LABVLDL", "LDLCALC", "CHOLHDL" in the last 168 hours.  Hematology Recent Labs  Lab 2022/07/04 0150 06/06/22 0215 06/07/22 0221  WBC 7.3 6.2 6.3  RBC 3.56* 3.41* 3.66*  HGB 9.4* 9.2* 9.9*  HCT 31.5* 31.1* 32.7*  MCV 88.5 91.2 89.3  MCH 26.4 27.0 27.0  MCHC 29.8* 29.6* 30.3  RDW 15.6* 15.6* 15.3  PLT 149* 137* 150   Thyroid  Recent Labs  Lab 06/04/22 0530  TSH 0.873    BNP Recent  Labs  Lab 06/04/22 1916  BNP 435.8*    DDimer No results for input(s): "DDIMER" in the last 168 hours.   Radiology    No results found.  Cardiac Studies   Limited Echocardiogram 2022/07/04 1. Limited echo to assess LVEF.   2. Left ventricular ejection fraction, by estimation, is 45 to 50%. The  left ventricle has mildly decreased function. The left ventricle  demonstrates global hypokinesis. There is mild concentric left ventricular  hypertrophy.   3. The mitral valve is degenerative. Severe mitral annular calcification.   4. Patient is in Afib with RVR during study   Patient Profile     67 y.o. female with PMH of O2 dependent COPD, type 2 diabetes mellitus, bipolar disorder, anxiety with depression, hypertension, paroxysmal atrial fibrillation, cardiology is following for evaluation of atrial flutter in the setting of COPD exacerbation and pneumonia.     Assessment & Plan    Persistent Atrial Flutter HFmrEF  - Patient presented to the ED on 1/12 complaining of SOB, cough. Admitted for treatment of COPD and CAP. Found to be in atrial flutter, HR elevated to the 150s.  - Echocardiogram showed EF 45-50% (down from 55-60% in 2021). Suspect this is tachy induced CM in the setting of aflutter with RVR  - TSH WNL 06/04/22  - Rate control has been adequate 70s over the past 12-24 hours - Continue cardizem CD 360 mg daily and metoprolol '25mg'$  BID - Appreciate EP input: poor candidate for flecainide, multaqm sotalol, and amiodarone due to LV dysfunction and severe COPD; Tikosyn can't  be used due to patient is on multiple QT prolonging agents such as HCTZ, Breo Ellipta, Seroquel, and Sertraline; for now will try to up titrate AVN blocking agents for rate control  - TEE DCCV is a reasonable option, she has agreed, will see if schedule allows today, otherwise maybe done outpatient  - Continue eliquis 5 mg BID for anticoagulation   HFmrEF  - Echocardiogram showed EF 45-50% (down from 55-60% in  2021). Suspect this is tachy induced CM in the setting of aflutter with RVR  - Transitioned from IV to PO lasix 40 mg 06/09/22.  - GDMT: may convert metoprolol to XL at time of discharge; BP is low, no room for ARNI/MRA/SGLT2i - Plan to repeat Echo in about 3 months to re-assess CM   Mitral Valve Calcification  - Limited echo this admission showed that the mitral valve was degenerative, severe mitral annular calcification - Previous echo in 2021 showed moderate mitral annular calcification, trivial MR - Follow as an outpatient    Otherwise per primary  - COPD exacerbation, CAP  - Urinary Retention  - Type 2 DM  - Anxiety with depression  - Chronic Pain     For questions or updates, please contact [place] Please consult [email or web address] for contact info under        Signed, Margie Billet, NP  06/10/2022, 8:24 AM    Personally seen and examined. Agree with above.  67 year old with atrial flutter, typical with rapid ventricular response  Atrial flutter - Appreciate Trish and endoscopy as well as anesthesia helping [place] out.  She is scheduled for 4 PM today for TEE cardioversion.  Risk and benefits have been discussed.  Spoke with daughter as well.  She is breathing comfortably. -We have been having difficulty over the last several days controlling her heart rate despite heavy doses of AV nodal blocking agents.  When she ambulated this morning once again her heart rate jumped to 150.  My concern is that she will develop a worsening tachycardia induced cardiomyopathy. -Currently on diltiazem 360 mg a day, metoprolol 25 mg twice daily  Chronic anticoagulation - Currently on Eliquis 5 mg twice a day.  She has not been on long enough to do cardioversion alone.  COPD - Currently stable.  If stable postprocedure, she should be able to be discharged.  Candee Furbish, MD

## 2022-06-10 NOTE — Interval H&P Note (Signed)
History and Physical Interval Note:  06/10/2022 2:53 PM  [name] [name]  has presented today for surgery, with the diagnosis of atrial fibrillation.  The various methods of treatment have been discussed with the patient and family. After consideration of risks, benefits and other options for treatment, the patient has consented to  Procedure(s): TRANSESOPHAGEAL ECHOCARDIOGRAM (TEE) (N/A) CARDIOVERSION (N/A) as a surgical intervention.  The patient's history has been reviewed, patient examined, no change in status, stable for surgery.  I have reviewed the patient's chart and labs.  Questions were answered to the patient's satisfaction.     Elouise Munroe

## 2022-06-10 NOTE — Transfer of Care (Signed)
Immediate Anesthesia Transfer of Care Note  Patient: [name] [name]  Procedure(s) Performed: TRANSESOPHAGEAL ECHOCARDIOGRAM (TEE) CARDIOVERSION BUBBLE STUDY  Patient Location: Endoscopy Unit  Anesthesia Type:MAC  Level of Consciousness: drowsy  Airway & Oxygen Therapy: Patient Spontanous Breathing and Patient connected to nasal cannula oxygen  Post-op Assessment: Report given to RN and Post -op Vital signs reviewed and stable  Post vital signs: Reviewed and stable  Last Vitals:  Vitals Value Taken Time  BP 87/46 06/10/22 1610  Temp 36.3 C 06/10/22 1609  Pulse 66 06/10/22 1611  Resp 23 06/10/22 1611  SpO2 92 % 06/10/22 1611  Vitals shown include unvalidated device data.  Last Pain:  Vitals:   06/10/22 1609  TempSrc: Temporal  PainSc: Asleep      Patients Stated Pain Goal: 0 (36/85/99 2341)  Complications: No notable events documented.

## 2022-06-10 NOTE — Progress Notes (Signed)
Physical Therapy Treatment Patient Details Name: [name] [name] MRN: [id number] DOB: 06/04/1955 Today's Date: 06/10/2022   History of Present Illness Pt is a 67 y.o. female presenting 06/03/22 with 3-day h/o progressive SOB and chest pain. Workup for COPD exacerbation, CAP, aflutter. PMH includes afib, COPD (2 L O2 baseline), DM2, HTN, HLD, chronic pain, bipolar 1 disorder.    PT Comments    Pt received sitting EOB. Supervision transfers and amb 61' with RW. SpO2 93% on 2L during activity. Resting HR in 70s. Max HR 142. 2/4 DOE with minimal activity. Pt declining further mobility. She is currently NPO awaiting possible cardioversion today.    Recommendations for follow up therapy are one component of a multi-disciplinary discharge planning process, led by the attending physician.  Recommendations may be updated based on patient status, additional functional criteria and insurance authorization.  Follow Up Recommendations  Home health PT     Assistance Recommended at Discharge PRN  Patient can return home with the following A little help with bathing/dressing/bathroom;Assistance with cooking/housework;Assist for transportation;Help with stairs or ramp for entrance   Equipment Recommendations  Rollator (4 wheels) (bariatric)    Recommendations for Other Services       Precautions / Restrictions Precautions Precautions: Fall;Other (comment) Precaution Comments: watch SpO2 (wears 2L O2 baseline)     Mobility  Bed Mobility               General bed mobility comments: received sitting up at EOB    Transfers Overall transfer level: Needs assistance Equipment used: Rolling walker (2 wheels) Transfers: Sit to/from Stand Sit to Stand: Supervision                Ambulation/Gait Ambulation/Gait assistance: Supervision Gait Distance (Feet): 40 Feet Assistive device: Rollator (4 wheels) Gait Pattern/deviations: Step-through pattern, Decreased stride length Gait  velocity: Decreased Gait velocity interpretation: <1.31 ft/sec, indicative of household ambulator   General Gait Details: Steady gait with rollator. SpO2 93% on 2L. HR up to 142   Stairs             Wheelchair Mobility    Modified Rankin (Stroke Patients Only)       Balance Overall balance assessment: Needs assistance Sitting-balance support: No upper extremity supported, Feet supported Sitting balance-Leahy Scale: Good     Standing balance support: No upper extremity supported, During functional activity, Bilateral upper extremity supported Standing balance-Leahy Scale: Fair Standing balance comment: rollator for gait increased distances                            Cognition Arousal/Alertness: Awake/alert Behavior During Therapy: WFL for tasks assessed/performed Overall Cognitive Status: Within Functional Limits for tasks assessed                                          Exercises      General Comments General comments (skin integrity, edema, etc.): Resting HR in 70s. Pt is NPO awaiting possible cardioversion today.      Pertinent Vitals/Pain Pain Assessment Pain Assessment: No/denies pain    Home Living                          Prior Function            PT Goals (current goals can now be found in the care  plan section) Acute Rehab PT Goals Patient Stated Goal: home Progress towards PT goals: Progressing toward goals    Frequency    Min 3X/week      PT Plan Current plan remains appropriate    Co-evaluation              AM-PAC PT "6 Clicks" Mobility   Outcome Measure  Help needed turning from your back to your side while in a flat bed without using bedrails?: None Help needed moving from lying on your back to sitting on the side of a flat bed without using bedrails?: None Help needed moving to and from a bed to a chair (including a wheelchair)?: A Little Help needed standing up from a chair  using your arms (e.g., wheelchair or bedside chair)?: A Little Help needed to walk in hospital room?: A Little Help needed climbing 3-5 steps with a railing? : A Little 6 Click Score: 20    End of Session Equipment Utilized During Treatment: Oxygen Activity Tolerance: Patient tolerated treatment well Patient left: in bed;with call bell/phone within reach Nurse Communication: Mobility status PT Visit Diagnosis: Unsteadiness on feet (R26.81);Other abnormalities of gait and mobility (R26.89);Repeated falls (R29.6)     Time: 1694-5038 PT Time Calculation (min) (ACUTE ONLY): 17 min  Charges:  $Gait Training: 8-22 mins                     Lorrin Goodell, PT  Office # [phone] Pager [phone]    [name] [name] 06/10/2022, 9:27 AM

## 2022-06-10 NOTE — CV Procedure (Signed)
INDICATIONS: Aflutter  PROCEDURE:   Informed consent was obtained prior to the procedure. The risks, benefits and alternatives for the procedure were discussed and the patient comprehended these risks.  Risks include, but are not limited to, cough, sore throat, vomiting, nausea, somnolence, esophageal and stomach trauma or perforation, bleeding, low blood pressure, aspiration, pneumonia, infection, trauma to the teeth and death.    After a procedural time-out, the oropharynx was anesthetized with 20% benzocaine spray.   During this procedure the patient was administered propofol per anesthesia.  The patient's heart rate, blood pressure, and oxygen saturation were monitored continuously during the procedure. The period of conscious sedation was 30 minutes, of which I was present face-to-face 100% of this time.  The transesophageal probe was inserted in the esophagus and stomach without difficulty and multiple views were obtained.  The patient was kept under observation until the patient left the procedure room.  The patient left the procedure room in stable condition.   Agitated microbubble saline contrast was administered.  COMPLICATIONS:    There were no immediate complications.  FINDINGS:   FORMAL ECHOCARDIOGRAM REPORT PENDING No LA appendage thrombus  RECOMMENDATIONS:    Proceed to cardioversion   Procedure: Electrical Cardioversion Indications:  Atrial Flutter  Procedure Details:  Consent: Risks of procedure as well as the alternatives and risks of each were explained to the (patient/caregiver).  Consent for procedure obtained.  Time Out: Verified patient identification, verified procedure, site/side was marked, verified correct patient position, special equipment/implants available, medications/allergies/relevent history reviewed, required imaging and test results available. PERFORMED.  Patient placed on cardiac monitor, pulse oximetry, supplemental oxygen as necessary.   Sedation given:  propofol per anesthesia Pacer pads placed anterior and posterior chest.  Cardioverted 1 time(s).  Cardioversion with synchronized biphasic 120J shock.  Evaluation: Findings: Post procedure EKG shows: NSR Complications: None Patient did tolerate procedure well.  Time Spent Directly with the Patient:  60 minutes   [name] [name] 06/10/2022, 4:27 PM

## 2022-06-10 NOTE — H&P (View-Only) (Signed)
Rounding Note    Patient Name: [name] [name] Date of Encounter: 06/10/2022  [place] Cardiologist: [name] Dolly, MD   Subjective   She is feeling well, unaware of her a flutter today, no chest pain, dizziness, palpitation. She states she had agreed with TEE cardioversion with Dr [name] [name] yesterday. She has tolerated anesthesia in the past, states "as long as it's not too long".   Inpatient Medications    Scheduled Meds:  apixaban  5 mg Oral BID   atorvastatin  20 mg Oral QHS   benztropine  0.5 mg Oral BID   busPIRone  10 mg Oral TID   diltiazem  360 mg Oral Daily   divalproex  500 mg Oral BID   fluticasone furoate-vilanterol  1 puff Inhalation Daily   furosemide  40 mg Oral Daily   HYDROcodone-acetaminophen  1 tablet Oral TID   insulin aspart  0-20 Units Subcutaneous TID WC   levalbuterol  1.25 mg Nebulization BID   magnesium oxide  400 mg Oral Daily   metoprolol tartrate  25 mg Oral BID   polyethylene glycol  17 g Oral Daily   QUEtiapine  25 mg Oral BID   And   QUEtiapine  50 mg Oral QHS   senna-docusate  1 tablet Oral QHS   sertraline  200 mg Oral Daily   umeclidinium bromide  1 puff Inhalation Daily   Continuous Infusions:  PRN Meds: acetaminophen **OR** acetaminophen, ipratropium-albuterol, nicotine   Vital Signs    Vitals:   06/09/22 2200 06/09/22 2339 06/10/22 0524 06/10/22 0759  BP:  (!) 104/43 106/65 (!) 94/59  Pulse:  70 69 70  Resp:  '19 19 20  '$ Temp:  97.8 F (36.6 C) 98 F (36.7 C) 97.9 F (36.6 C)  TempSrc:  Oral Oral Oral  SpO2: 93% 92% 95% 93%  Weight:      Height:        Intake/Output Summary (Last 24 hours) at 06/10/2022 0824 Last data filed at 06/10/2022 0529 Gross per 24 hour  Intake 240 ml  Output 2200 ml  Net -1960 ml      06/03/2022    7:33 PM 02/22/2020    8:17 PM 02/22/2020    6:48 AM  Last 3 Weights  Weight (lbs) 244 lb 195 lb 1.7 oz 198 lb 13.7 oz  Weight (kg) 110.678 kg 88.5 kg 90.2 kg      Telemetry     Atrial flutter with variable block, ventricular rate of 70s - Personally Reviewed  ECG    No new tracings today  - Personally Reviewed  Physical Exam   GEN: No acute distress. Laying.  Neck: No JVD Cardiac: Irregular rate and rhythm. Grade 2/6 systolic murmur  Respiratory: No wheezing, lung clear, diminished at base, on 3LNC , pox 95% GI: Soft, nontender, non-distended  MS: Trace edema of BLE; No deformity. Neuro:  Nonfocal  Psych: Normal affect   Labs    High Sensitivity Troponin:   Recent Labs  Lab 06/03/22 1925 06/03/22 2125 06/04/22 1937 06/04/22 2141  TROPONINIHS '10 9 9 10     '$ Chemistry Recent Labs  Lab 06/04/22 0530 06/04/22 0549 06/05/22 0150 06/06/22 0215 06/08/22 0237 06/09/22 0208 06/10/22 0315  NA 137   < > 138   < > 138 140 141  K 4.4   < > 4.7   < > 4.3 3.9 3.8  CL 94*  --  96*   < > 91* 91* 96*  CO2 34*  --  32   < > 32 35* 34*  GLUCOSE 215*  --  153*   < > 143* 137* 170*  BUN 13  --  27*   < > 32* 34* 38*  CREATININE 0.65  --  0.73   < > 0.68 0.73 0.84  CALCIUM 8.8*  --  8.6*   < > 8.9 9.1 9.4  MG 2.0  --  2.2  --   --   --   --   PROT 6.6  --  6.4*  --   --   --   --   ALBUMIN 3.4*  --  3.4*  --   --   --   --   AST 14*  --  13*  --   --   --   --   ALT 12  --  11  --   --   --   --   ALKPHOS 65  --  56  --   --   --   --   BILITOT 0.4  --  0.2*  --   --   --   --   GFRNONAA >60  --  >60   < > >60 >60 >60  ANIONGAP 9  --  10   < > '15 14 11   '$ < > = values in this interval not displayed.    Lipids No results for input(s): "CHOL", "TRIG", "HDL", "LABVLDL", "LDLCALC", "CHOLHDL" in the last 168 hours.  Hematology Recent Labs  Lab 07-05-22 0150 06/06/22 0215 06/07/22 0221  WBC 7.3 6.2 6.3  RBC 3.56* 3.41* 3.66*  HGB 9.4* 9.2* 9.9*  HCT 31.5* 31.1* 32.7*  MCV 88.5 91.2 89.3  MCH 26.4 27.0 27.0  MCHC 29.8* 29.6* 30.3  RDW 15.6* 15.6* 15.3  PLT 149* 137* 150   Thyroid  Recent Labs  Lab 06/04/22 0530  TSH 0.873    BNP Recent  Labs  Lab 06/04/22 1916  BNP 435.8*    DDimer No results for input(s): "DDIMER" in the last 168 hours.   Radiology    No results found.  Cardiac Studies   Limited Echocardiogram 07/05/2022 1. Limited echo to assess LVEF.   2. Left ventricular ejection fraction, by estimation, is 45 to 50%. The  left ventricle has mildly decreased function. The left ventricle  demonstrates global hypokinesis. There is mild concentric left ventricular  hypertrophy.   3. The mitral valve is degenerative. Severe mitral annular calcification.   4. Patient is in Afib with RVR during study   Patient Profile     67 y.o. female with PMH of O2 dependent COPD, type 2 diabetes mellitus, bipolar disorder, anxiety with depression, hypertension, paroxysmal atrial fibrillation, cardiology is following for evaluation of atrial flutter in the setting of COPD exacerbation and pneumonia.     Assessment & Plan    Persistent Atrial Flutter HFmrEF  - Patient presented to the ED on 1/12 complaining of SOB, cough. Admitted for treatment of COPD and CAP. Found to be in atrial flutter, HR elevated to the 150s.  - Echocardiogram showed EF 45-50% (down from 55-60% in 2021). Suspect this is tachy induced CM in the setting of aflutter with RVR  - TSH WNL 06/04/22  - Rate control has been adequate 70s over the past 12-24 hours - Continue cardizem CD 360 mg daily and metoprolol '25mg'$  BID - Appreciate EP input: poor candidate for flecainide, multaqm sotalol, and amiodarone due to LV dysfunction and severe COPD; Tikosyn can't  be used due to patient is on multiple QT prolonging agents such as HCTZ, Breo Ellipta, Seroquel, and Sertraline; for now will try to up titrate AVN blocking agents for rate control  - TEE DCCV is a reasonable option, she has agreed, will see if schedule allows today, otherwise maybe done outpatient  - Continue eliquis 5 mg BID for anticoagulation   HFmrEF  - Echocardiogram showed EF 45-50% (down from 55-60% in  2021). Suspect this is tachy induced CM in the setting of aflutter with RVR  - Transitioned from IV to PO lasix 40 mg 06/09/22.  - GDMT: may convert metoprolol to XL at time of discharge; BP is low, no room for ARNI/MRA/SGLT2i - Plan to repeat Echo in about 3 months to re-assess CM   Mitral Valve Calcification  - Limited echo this admission showed that the mitral valve was degenerative, severe mitral annular calcification - Previous echo in 2021 showed moderate mitral annular calcification, trivial MR - Follow as an outpatient    Otherwise per primary  - COPD exacerbation, CAP  - Urinary Retention  - Type 2 DM  - Anxiety with depression  - Chronic Pain     For questions or updates, please contact [place] Please consult [email or web address] for contact info under        Signed, Margie Billet, NP  06/10/2022, 8:24 AM    Personally seen and examined. Agree with above.  67 year old with atrial flutter, typical with rapid ventricular response  Atrial flutter - Appreciate Trish and endoscopy as well as anesthesia helping [place] out.  She is scheduled for 4 PM today for TEE cardioversion.  Risk and benefits have been discussed.  Spoke with daughter as well.  She is breathing comfortably. -We have been having difficulty over the last several days controlling her heart rate despite heavy doses of AV nodal blocking agents.  When she ambulated this morning once again her heart rate jumped to 150.  My concern is that she will develop a worsening tachycardia induced cardiomyopathy. -Currently on diltiazem 360 mg a day, metoprolol 25 mg twice daily  Chronic anticoagulation - Currently on Eliquis 5 mg twice a day.  She has not been on long enough to do cardioversion alone.  COPD - Currently stable.  If stable postprocedure, she should be able to be discharged.  Candee Furbish, MD

## 2022-06-10 NOTE — Progress Notes (Addendum)
Daily Progress Note Intern Pager: [phone]  Patient name: [name] [name] Medical record number: [id number] Date of birth: [birth year] Age: 67 y.o. Gender: female  Primary Care Provider: [facility] Consultants: Cardiology Code Status: Full  Pt Overview and Major Events to Date:  1/12 - Admitted 1/14 - Found to be in a flutter, increased diltiazem per cardiology 1/16 - Transitioned to long acting diltiazem, IV Lasix started 1/17 - HR elevated again, metoprolol 25 mg BID added 1/18 - HR still elevated, TEE guided cardioversion planned per cardiology  Assessment and Plan: [name] [name] is a 67 y.o. female presenting with COPD exacerbation and superimposed CAP, found to be in persistent atrial flutter with RVR and now finished with CAP treatment.    Pertinent PMH/PSH includes COPD on 2L at home, T2DM, bipolar 1 disorder, HTN, and past episode of resolved afib during illness.  * Atrial flutter ([place]) Patient is still in A-flutter on diltiazem and metoprolol, HR ranging from 90-120, increases further with activity up to 150.  Cardiology has TEE guided cardioversion scheduled for 4 pm today. - Cardiology consulted, appreciate recs: - Cardizem 360 mg PO daily - Metoprolol 25 mg BID - Apixaban 5 mg twice daily - Lasix 40 mg PO daily for edema - Poor candidate for amiodarone d/t respiratory disease and current respiratory status - Planned TEE guided cardioversion - Repeat echo in 3 months to check EF on rate control - Electrophysiology signed off, no changes to recs  Acute hypoxic respiratory failure (HCC) Back to baseline on 3L [place] when ambulating with some mild work of breathing. - Xopenex (levabuterol) Q6H scheduled - Duonebs Q6H as needed - Tylenol prn for pain - Continue Incruse Ellipta daily (LAMA) and Breo Ellipta (ICS+LABA) - s/p CTX IV (5 days), azithromycin IV (3 days), and prednisone '40mg'$  PO (4 days)  Urinary retention Continuing to spontaneously  void.  No concern for retention at this time. - Strict I/O, continue to monitor - Bladder scan if not urinating, I&O >200 mL, if I&O x 3 start foley catheter  Non-insulin dependent type 2 diabetes mellitus ([place]) Patient w/ DM2, not on insulin. A1c at 6.7 on 06/03/22.  Required 11 units aspart in past 24 hours.  Glucose 164 this AM and generally stable. Regimen does not require changes at this time, will CTM. - CBG check ACHS - mSSI - Carb modified diet  Chronic pain Has chronic knee and hip pain due to OA.  Home medication includes Norco 3 times daily as needed which patient states she takes 3 times daily regularly. - Continue home Norco 3 times daily  FEN/GI: Carb modified diet PPx: Heparin IM Dispo: Home with DME ([facility] refused) pending clinical improvement . Barriers include persistent RVR and .  Subjective:  This morning, patient is still concerned about her upcoming procedure and reports anxiety.  Otherwise, she is feeling well and breathing better than she has in the past several weeks.  She feels comfortable and has been moving about her room without too much difficulty.  Denies chest pain, palpitations, dizziness.  Endorses mild dyspnea on exertion unchanged from yesterday.  Is ready to go home when possible.  Objective: Temp:  [97.8 F (36.6 C)-98 F (36.7 C)] 97.9 F (36.6 C) (01/19 0759) Pulse Rate:  [69-106] 71 (01/19 0956) Resp:  [16-20] 16 (01/19 0833) BP: (94-119)/(43-84) 105/74 (01/19 0956) SpO2:  [92 %-96 %] 93 % (01/19 0956)  Physical Exam: General: Resting in bed, NAD, alert and at baseline. Cardiovascular:  Tachycardic. Irregular rhythm. Normal S1/S2. No murmurs, rubs, or gallops appreciated. 2+ radial pulses. Pulmonary: Scattered end expiratory wheezes. Reduced breath sounds bilaterally. No increased WOB, no accessory muscle usage on 3L [place]. No crackles. Abdominal: No tenderness to deep or light palpation. No rebound or guarding. No HSM. Skin: Warm and  dry. Extremities: 1+ LE pitting edema bilaterally  Laboratory: Most recent CBC Lab Results  Component Value Date   WBC 6.3 06/07/2022   HGB 9.9 (L) 06/07/2022   HCT 32.7 (L) 06/07/2022   MCV 89.3 06/07/2022   PLT 150 06/07/2022   Most recent BMP    Latest Ref Rng & Units 06/10/2022    3:15 AM  BMP  Glucose 70 - 99 mg/dL 170   BUN 8 - 23 mg/dL 38   Creatinine 0.44 - 1.00 mg/dL 0.84   Sodium 135 - 145 mmol/L 141   Potassium 3.5 - 5.1 mmol/L 3.8   Chloride 98 - 111 mmol/L 96   CO2 22 - 32 mmol/L 34   Calcium 8.9 - 10.3 mg/dL 9.4    CBG (last 3)  Recent Labs    06/09/22 1634 06/09/22 2124 06/10/22 0756  GLUCAP 139* 201* 164*    Shitarev, Dimitry, Medical Student 06/10/2022, 10:28 AM  MS4, [facility] SOM, [place] Intern pager: [phone], text pages welcome Secure chat group [facility] Teaching Service   I was personally present and performed or re-performed the history, physical exam and medical decision making activities of this service and have verified that the service and findings are accurately documented in the student's note.  Shary Key, DO                  06/10/2022, 12:46 PM

## 2022-06-10 NOTE — TOC Transition Note (Signed)
Discharge medications (4) are being stored in the main pharmacy on the ground floor until patient is ready for discharge.

## 2022-06-10 NOTE — Progress Notes (Signed)
  Echocardiogram Echocardiogram Transesophageal has been performed.  Darlina Sicilian M 06/10/2022, 4:30 PM

## 2022-06-11 DIAGNOSIS — I5020 Unspecified systolic (congestive) heart failure: Secondary | ICD-10-CM | POA: Diagnosis not present

## 2022-06-11 DIAGNOSIS — I483 Typical atrial flutter: Secondary | ICD-10-CM | POA: Diagnosis not present

## 2022-06-11 LAB — GLUCOSE, CAPILLARY: Glucose-Capillary: 142 mg/dL — ABNORMAL HIGH (ref 70–99)

## 2022-06-11 MED ORDER — METOPROLOL TARTRATE 25 MG PO TABS: 25.0000 mg | ORAL_TABLET | Freq: Two times a day (BID) | ORAL | 0 refills | Status: DC

## 2022-06-11 NOTE — Plan of Care (Signed)
  Problem: Health Behavior/Discharge Planning: Goal: Ability to identify and utilize available resources and services will improve Outcome: Progressing Goal: Ability to manage health-related needs will improve Outcome: Progressing   Problem: Clinical Measurements: Goal: Cardiovascular complication will be avoided Outcome: Progressing   Problem: Clinical Measurements: Goal: Ability to maintain clinical measurements within normal limits will improve Outcome: Progressing   Problem: Activity: Goal: Risk for activity intolerance will decrease Outcome: Progressing   Problem: Pain Managment: Goal: General experience of comfort will improve Outcome: Progressing   Problem: Elimination: Goal: Will not experience complications related to urinary retention Outcome: Progressing   Problem: Safety: Goal: Ability to remain free from injury will improve Outcome: Progressing   Problem: Skin Integrity: Goal: Risk for impaired skin integrity will decrease Outcome: Progressing

## 2022-06-11 NOTE — Progress Notes (Signed)
   Has upcoming appointment with electrophysiology Dr. Myles Gip in February.  I appreciate all of the team work yesterday to get her TEE cardioversion performed for atrial flutter with rapid ventricular response.  She can now go home.  She does feel as though she has an extra "breath ".  Hopefully maintenance of normal rhythm will be helpful for her.  Telemetry shows heart rate currently 72.  Sinus rhythm.  Medications reviewed.  Discharge orders have been placed.  Excellent.  Candee Furbish, MD

## 2022-06-11 NOTE — Progress Notes (Signed)
   06/11/22 1100  Mobility  Activity Ambulated with assistance in room  Level of Assistance Standby assist, set-up cues, supervision of patient - no hands on  Assistive Device Front wheel walker  Distance Ambulated (ft) 20 ft  Activity Response Tolerated well  Mobility Referral Yes  $Mobility charge 1 Mobility   Mobility Specialist Progress Note  Received pt EOB having no complaints and agreeable to mobility. Pt was asymptomatic throughout ambulation and returned to room w/o fault. Left EOB w/ call bell in reach and all needs met.  Lucious Groves Mobility Specialist  Please contact via SecureChat or Rehab office at [phone]

## 2022-06-11 NOTE — TOC Transition Note (Signed)
Transition of Care [facility]) - CM/SW Discharge Note   Patient Details  Name: [name] [name] MRN: [id number] Date of Birth: 1956/03/03  Transition of Care [facility]) CM/SW Contact:  Bartholomew Crews, RN Phone Number: [phone] 06/11/2022, 7:43 AM   Clinical Narrative:     Chart reviewed. DC order in place. Previous RNCM arranged for rollator and shower stool. Patient previously declined [facility]. No further TOC needs identified.   Final next level of care: Home/Self Care Barriers to Discharge: No Barriers Identified   Patient Goals and CMS Choice      Discharge Placement                         Discharge Plan and Services Additional resources added to the After Visit Summary for   In-house Referral: NA Discharge Planning Services: CM Consult Post Acute Care Choice: Durable Medical Equipment          DME Arranged: Walker rolling with seat, Shower stool DME Agency: [employer] Date DME Agency Contacted: 06/08/22 Time DME Agency Contacted: 1223 Representative spoke with at DME Agency: [place]: Patient Refused [facility] (MD is aware of refusal.)          Social Determinants of Health (SDOH) Interventions SDOH Screenings   Food Insecurity: No Food Insecurity (06/04/2022)  Housing: Low Risk  (06/04/2022)  Transportation Needs: No Transportation Needs (06/04/2022)  Utilities: Not At Risk (06/04/2022)  Tobacco Use: High Risk (06/10/2022)     Readmission Risk Interventions     No data to display

## 2022-06-11 NOTE — Plan of Care (Signed)

## 2022-06-12 ENCOUNTER — Encounter (HOSPITAL_COMMUNITY): Payer: Self-pay | Admitting: Internal Medicine

## 2022-06-13 NOTE — Anesthesia Postprocedure Evaluation (Signed)
Anesthesia Post Note  Patient: [name] [name]  Procedure(s) Performed: TRANSESOPHAGEAL ECHOCARDIOGRAM (TEE) CARDIOVERSION BUBBLE STUDY     Patient location during evaluation: PACU Anesthesia Type: MAC Level of consciousness: awake and alert Pain management: pain level controlled Vital Signs Assessment: post-procedure vital signs reviewed and stable Respiratory status: spontaneous breathing, nonlabored ventilation, respiratory function stable and patient connected to nasal cannula oxygen Cardiovascular status: stable and blood pressure returned to baseline Postop Assessment: no apparent nausea or vomiting Anesthetic complications: no  No notable events documented.  Last Vitals:  Vitals:   06/11/22 0824 06/11/22 0842  BP:    Pulse: 74 79  Resp:    Temp:    SpO2: 96%     Last Pain:  Vitals:   06/11/22 0824  TempSrc:   PainSc: 6                  [name] S

## 2022-07-03 ENCOUNTER — Other Ambulatory Visit: Payer: Self-pay | Admitting: Family Medicine

## 2022-07-12 ENCOUNTER — Ambulatory Visit: Payer: Medicare Other | Admitting: Cardiovascular Disease

## 2022-09-14 ENCOUNTER — Other Ambulatory Visit: Payer: Self-pay

## 2022-09-14 ENCOUNTER — Encounter (HOSPITAL_COMMUNITY): Payer: Self-pay | Admitting: Emergency Medicine

## 2022-09-14 ENCOUNTER — Emergency Department (HOSPITAL_COMMUNITY): Payer: 59

## 2022-09-14 ENCOUNTER — Inpatient Hospital Stay (HOSPITAL_COMMUNITY)
Admission: EM | Admit: 2022-09-14 | Discharge: 2022-09-17 | DRG: 640 | Disposition: A | Discharge status: Home / Self Care | Payer: 59 | Attending: Family Medicine | Admitting: Family Medicine

## 2022-09-14 DIAGNOSIS — E860 Dehydration: Principal | ICD-10-CM

## 2022-09-14 DIAGNOSIS — E873 Alkalosis: Secondary | ICD-10-CM | POA: Diagnosis present

## 2022-09-14 DIAGNOSIS — E785 Hyperlipidemia, unspecified: Secondary | ICD-10-CM | POA: Diagnosis present

## 2022-09-14 DIAGNOSIS — Z122 Encounter for screening for malignant neoplasm of respiratory organs: Secondary | ICD-10-CM

## 2022-09-14 DIAGNOSIS — E039 Hypothyroidism, unspecified: Secondary | ICD-10-CM | POA: Diagnosis present

## 2022-09-14 DIAGNOSIS — G8929 Other chronic pain: Secondary | ICD-10-CM | POA: Diagnosis present

## 2022-09-14 DIAGNOSIS — J441 Chronic obstructive pulmonary disease with (acute) exacerbation: Secondary | ICD-10-CM | POA: Diagnosis present

## 2022-09-14 DIAGNOSIS — Z825 Family history of asthma and other chronic lower respiratory diseases: Secondary | ICD-10-CM

## 2022-09-14 DIAGNOSIS — F17200 Nicotine dependence, unspecified, uncomplicated: Secondary | ICD-10-CM | POA: Insufficient documentation

## 2022-09-14 DIAGNOSIS — D649 Anemia, unspecified: Secondary | ICD-10-CM | POA: Insufficient documentation

## 2022-09-14 DIAGNOSIS — F319 Bipolar disorder, unspecified: Secondary | ICD-10-CM | POA: Diagnosis present

## 2022-09-14 DIAGNOSIS — Z880 Allergy status to penicillin: Secondary | ICD-10-CM

## 2022-09-14 DIAGNOSIS — Z6372 Alcoholism and drug addiction in family: Secondary | ICD-10-CM

## 2022-09-14 DIAGNOSIS — I5022 Chronic systolic (congestive) heart failure: Secondary | ICD-10-CM | POA: Diagnosis present

## 2022-09-14 DIAGNOSIS — I4892 Unspecified atrial flutter: Secondary | ICD-10-CM | POA: Diagnosis present

## 2022-09-14 DIAGNOSIS — Z7951 Long term (current) use of inhaled steroids: Secondary | ICD-10-CM

## 2022-09-14 DIAGNOSIS — N179 Acute kidney failure, unspecified: Secondary | ICD-10-CM | POA: Diagnosis present

## 2022-09-14 DIAGNOSIS — Z28310 Unvaccinated for covid-19: Secondary | ICD-10-CM

## 2022-09-14 DIAGNOSIS — E119 Type 2 diabetes mellitus without complications: Secondary | ICD-10-CM | POA: Diagnosis present

## 2022-09-14 DIAGNOSIS — Z882 Allergy status to sulfonamides status: Secondary | ICD-10-CM

## 2022-09-14 DIAGNOSIS — J9621 Acute and chronic respiratory failure with hypoxia: Secondary | ICD-10-CM | POA: Diagnosis present

## 2022-09-14 DIAGNOSIS — Z7901 Long term (current) use of anticoagulants: Secondary | ICD-10-CM

## 2022-09-14 DIAGNOSIS — F419 Anxiety disorder, unspecified: Secondary | ICD-10-CM | POA: Diagnosis present

## 2022-09-14 DIAGNOSIS — R251 Tremor, unspecified: Secondary | ICD-10-CM | POA: Diagnosis present

## 2022-09-14 DIAGNOSIS — J449 Chronic obstructive pulmonary disease, unspecified: Secondary | ICD-10-CM | POA: Diagnosis present

## 2022-09-14 DIAGNOSIS — Z6838 Body mass index (BMI) 38.0-38.9, adult: Secondary | ICD-10-CM

## 2022-09-14 DIAGNOSIS — I110 Hypertensive heart disease with heart failure: Secondary | ICD-10-CM | POA: Diagnosis present

## 2022-09-14 DIAGNOSIS — E669 Obesity, unspecified: Secondary | ICD-10-CM | POA: Diagnosis present

## 2022-09-14 DIAGNOSIS — I4891 Unspecified atrial fibrillation: Secondary | ICD-10-CM | POA: Diagnosis present

## 2022-09-14 DIAGNOSIS — I10 Essential (primary) hypertension: Secondary | ICD-10-CM | POA: Diagnosis present

## 2022-09-14 DIAGNOSIS — Z79899 Other long term (current) drug therapy: Secondary | ICD-10-CM

## 2022-09-14 DIAGNOSIS — F39 Unspecified mood [affective] disorder: Secondary | ICD-10-CM | POA: Insufficient documentation

## 2022-09-14 DIAGNOSIS — Z888 Allergy status to other drugs, medicaments and biological substances status: Secondary | ICD-10-CM

## 2022-09-14 DIAGNOSIS — F17210 Nicotine dependence, cigarettes, uncomplicated: Secondary | ICD-10-CM | POA: Diagnosis present

## 2022-09-14 DIAGNOSIS — Z818 Family history of other mental and behavioral disorders: Secondary | ICD-10-CM

## 2022-09-14 DIAGNOSIS — Z820 Family history of epilepsy and other diseases of the nervous system: Secondary | ICD-10-CM

## 2022-09-14 LAB — URINALYSIS, W/ REFLEX TO CULTURE (INFECTION SUSPECTED)
Bilirubin Urine: NEGATIVE
Glucose, UA: NEGATIVE mg/dL
Hgb urine dipstick: NEGATIVE
Ketones, ur: NEGATIVE mg/dL
Nitrite: NEGATIVE
Protein, ur: NEGATIVE mg/dL
Specific Gravity, Urine: 1.013 (ref 1.005–1.030)
pH: 5 (ref 5.0–8.0)

## 2022-09-14 LAB — CBC WITH DIFFERENTIAL/PLATELET
Abs Immature Granulocytes: 0.04 10*3/uL (ref 0.00–0.07)
Basophils Absolute: 0.1 10*3/uL (ref 0.0–0.1)
Basophils Relative: 1 %
Eosinophils Absolute: 0.2 10*3/uL (ref 0.0–0.5)
Eosinophils Relative: 4 %
HCT: 29.7 % — ABNORMAL LOW (ref 36.0–46.0)
Hemoglobin: 9.1 g/dL — ABNORMAL LOW (ref 12.0–15.0)
Immature Granulocytes: 1 %
Lymphocytes Relative: 20 %
Lymphs Abs: 1.2 10*3/uL (ref 0.7–4.0)
MCH: 27.3 pg (ref 26.0–34.0)
MCHC: 30.6 g/dL (ref 30.0–36.0)
MCV: 89.2 fL (ref 80.0–100.0)
Monocytes Absolute: 0.3 10*3/uL (ref 0.1–1.0)
Monocytes Relative: 6 %
Neutro Abs: 3.8 10*3/uL (ref 1.7–7.7)
Neutrophils Relative %: 68 %
Platelets: 182 10*3/uL (ref 150–400)
RBC: 3.33 MIL/uL — ABNORMAL LOW (ref 3.87–5.11)
RDW: 14.4 % (ref 11.5–15.5)
WBC: 5.6 10*3/uL (ref 4.0–10.5)
nRBC: 0 % (ref 0.0–0.2)

## 2022-09-14 LAB — BLOOD GAS, VENOUS
Acid-Base Excess: 14.7 mmol/L — ABNORMAL HIGH (ref 0.0–2.0)
Bicarbonate: 40.8 mmol/L — ABNORMAL HIGH (ref 20.0–28.0)
Drawn by: 442
O2 Saturation: 89.7 %
Patient temperature: 36.7
pCO2, Ven: 59 mmHg (ref 44–60)
pH, Ven: 7.44 — ABNORMAL HIGH (ref 7.25–7.43)
pO2, Ven: 54 mmHg — ABNORMAL HIGH (ref 32–45)

## 2022-09-14 LAB — COMPREHENSIVE METABOLIC PANEL
ALT: 17 U/L (ref 0–44)
AST: 19 U/L (ref 15–41)
Albumin: 3.9 g/dL (ref 3.5–5.0)
Alkaline Phosphatase: 60 U/L (ref 38–126)
Anion gap: 11 (ref 5–15)
BUN: 91 mg/dL — ABNORMAL HIGH (ref 8–23)
CO2: 34 mmol/L — ABNORMAL HIGH (ref 22–32)
Calcium: 9.4 mg/dL (ref 8.9–10.3)
Chloride: 94 mmol/L — ABNORMAL LOW (ref 98–111)
Creatinine, Ser: 1.53 mg/dL — ABNORMAL HIGH (ref 0.44–1.00)
GFR, Estimated: 37 mL/min — ABNORMAL LOW (ref >60)
Glucose, Bld: 136 mg/dL — ABNORMAL HIGH (ref 70–99)
Potassium: 4.8 mmol/L (ref 3.5–5.1)
Sodium: 139 mmol/L (ref 135–145)
Total Bilirubin: 0.6 mg/dL (ref 0.3–1.2)
Total Protein: 7.5 g/dL (ref 6.5–8.1)

## 2022-09-14 LAB — PROTIME-INR
INR: 1.6 — ABNORMAL HIGH (ref 0.8–1.2)
Prothrombin Time: 18.5 seconds — ABNORMAL HIGH (ref 11.4–15.2)

## 2022-09-14 LAB — MAGNESIUM: Magnesium: 3.6 mg/dL — ABNORMAL HIGH (ref 1.7–2.4)

## 2022-09-14 MED ORDER — SERTRALINE HCL 50 MG PO TABS
200.0000 mg | ORAL_TABLET | Freq: Every day | ORAL | Status: DC
  Administered 2022-09-15 – 2022-09-17 (×3): 200 mg via ORAL
  Filled 2022-09-14 (×3): qty 4

## 2022-09-14 MED ORDER — APIXABAN 5 MG PO TABS
5.0000 mg | ORAL_TABLET | Freq: Two times a day (BID) | ORAL | Status: DC
  Administered 2022-09-15 – 2022-09-17 (×6): 5 mg via ORAL
  Filled 2022-09-14 (×6): qty 1

## 2022-09-14 MED ORDER — ONDANSETRON HCL 4 MG PO TABS: 4.0000 mg | ORAL_TABLET | Freq: Four times a day (QID) | ORAL | Status: DC | PRN

## 2022-09-14 MED ORDER — ONDANSETRON HCL 4 MG/2ML IJ SOLN: 4.0000 mg | Freq: Four times a day (QID) | INTRAMUSCULAR | Status: DC | PRN

## 2022-09-14 MED ORDER — FUROSEMIDE 10 MG/ML IJ SOLN
40.0000 mg | Freq: Once | INTRAMUSCULAR | Status: AC
  Administered 2022-09-14: 40 mg via INTRAVENOUS
  Filled 2022-09-14: qty 4

## 2022-09-14 MED ORDER — ACETAMINOPHEN 650 MG RE SUPP: 650.0000 mg | Freq: Four times a day (QID) | RECTAL | Status: DC | PRN

## 2022-09-14 MED ORDER — SODIUM CHLORIDE 0.9 % IV BOLUS
1000.0000 mL | Freq: Once | INTRAVENOUS | Status: AC
  Administered 2022-09-14: 1000 mL via INTRAVENOUS

## 2022-09-14 MED ORDER — BENZTROPINE MESYLATE 1 MG PO TABS
0.5000 mg | ORAL_TABLET | Freq: Two times a day (BID) | ORAL | Status: DC
  Administered 2022-09-15 – 2022-09-17 (×6): 0.5 mg via ORAL
  Filled 2022-09-14 (×6): qty 1

## 2022-09-14 MED ORDER — ACETAMINOPHEN 325 MG PO TABS: 650.0000 mg | ORAL_TABLET | Freq: Four times a day (QID) | ORAL | Status: DC | PRN

## 2022-09-14 MED ORDER — UMECLIDINIUM BROMIDE 62.5 MCG/ACT IN AEPB
1.0000 | INHALATION_SPRAY | Freq: Every day | RESPIRATORY_TRACT | Status: DC
  Administered 2022-09-15 – 2022-09-17 (×3): 1 via RESPIRATORY_TRACT
  Filled 2022-09-14: qty 7

## 2022-09-14 MED ORDER — OXYCODONE HCL 5 MG PO TABS
5.0000 mg | ORAL_TABLET | ORAL | Status: DC | PRN
  Administered 2022-09-16 (×4): 5 mg via ORAL
  Filled 2022-09-14 (×4): qty 1

## 2022-09-14 MED ORDER — SODIUM CHLORIDE 0.9 % IV SOLN: INTRAVENOUS | Status: DC

## 2022-09-14 MED ORDER — METOPROLOL TARTRATE 25 MG PO TABS
25.0000 mg | ORAL_TABLET | Freq: Two times a day (BID) | ORAL | Status: DC
  Administered 2022-09-15 – 2022-09-17 (×5): 25 mg via ORAL
  Filled 2022-09-14 (×6): qty 1

## 2022-09-14 MED ORDER — DIVALPROEX SODIUM 250 MG PO DR TAB
500.0000 mg | DELAYED_RELEASE_TABLET | Freq: Two times a day (BID) | ORAL | Status: DC
  Administered 2022-09-15 – 2022-09-17 (×6): 500 mg via ORAL
  Filled 2022-09-14 (×6): qty 2

## 2022-09-14 MED ORDER — QUETIAPINE FUMARATE 25 MG PO TABS
50.0000 mg | ORAL_TABLET | Freq: Every day | ORAL | Status: DC
  Administered 2022-09-15 – 2022-09-16 (×3): 50 mg via ORAL
  Filled 2022-09-14 (×3): qty 2

## 2022-09-14 MED ORDER — BUSPIRONE HCL 5 MG PO TABS
10.0000 mg | ORAL_TABLET | Freq: Three times a day (TID) | ORAL | Status: DC
  Administered 2022-09-15 – 2022-09-17 (×7): 10 mg via ORAL
  Filled 2022-09-14 (×7): qty 2

## 2022-09-14 MED ORDER — HYDROCHLOROTHIAZIDE 12.5 MG PO TABS
12.5000 mg | ORAL_TABLET | Freq: Every day | ORAL | Status: DC
  Administered 2022-09-15 – 2022-09-17 (×3): 12.5 mg via ORAL
  Filled 2022-09-14 (×3): qty 1

## 2022-09-14 MED ORDER — ATORVASTATIN CALCIUM 10 MG PO TABS
20.0000 mg | ORAL_TABLET | Freq: Every day | ORAL | Status: DC
  Administered 2022-09-15 – 2022-09-16 (×3): 20 mg via ORAL
  Filled 2022-09-14 (×3): qty 2

## 2022-09-14 MED ORDER — ALBUTEROL SULFATE (2.5 MG/3ML) 0.083% IN NEBU
2.5000 mg | INHALATION_SOLUTION | Freq: Four times a day (QID) | RESPIRATORY_TRACT | Status: DC | PRN
  Administered 2022-09-15 – 2022-09-16 (×4): 2.5 mg via RESPIRATORY_TRACT
  Filled 2022-09-14 (×3): qty 3

## 2022-09-14 MED ORDER — DILTIAZEM HCL ER BEADS 240 MG PO CP24
360.0000 mg | ORAL_CAPSULE | Freq: Every day | ORAL | Status: DC
  Administered 2022-09-15 – 2022-09-17 (×3): 360 mg via ORAL
  Filled 2022-09-14 (×8): qty 1

## 2022-09-14 MED ORDER — QUETIAPINE FUMARATE 25 MG PO TABS
25.0000 mg | ORAL_TABLET | Freq: Two times a day (BID) | ORAL | Status: DC
  Administered 2022-09-15 – 2022-09-17 (×5): 25 mg via ORAL
  Filled 2022-09-14 (×5): qty 1

## 2022-09-14 MED ORDER — AMLODIPINE BESYLATE 5 MG PO TABS
5.0000 mg | ORAL_TABLET | Freq: Every day | ORAL | Status: DC
  Administered 2022-09-15 – 2022-09-17 (×3): 5 mg via ORAL
  Filled 2022-09-14 (×3): qty 1

## 2022-09-14 MED ORDER — FLUTICASONE FUROATE-VILANTEROL 100-25 MCG/ACT IN AEPB
1.0000 | INHALATION_SPRAY | Freq: Every day | RESPIRATORY_TRACT | Status: DC
  Administered 2022-09-15 – 2022-09-17 (×3): 1 via RESPIRATORY_TRACT
  Filled 2022-09-14: qty 28

## 2022-09-14 NOTE — ED Provider Notes (Signed)
[place] EMERGENCY DEPARTMENT AT [facility] Provider Note  CSN: [id number] Arrival date & time: 09/14/22 1700  Chief Complaint(s) Altered Mental Status  HPI [name] [name] is a 67 y.o. female presenting to the emergency department with fatigue.  Patient reports that she has been more tired than normal lately.  She reports that sometimes she falls asleep during conversations.  She has also had a mild tremor.  She reports that she just feels slightly more confused than normal.  She denies any pain anywhere in her body including headaches, chest pain, back pain, abdominal pain.  She denies any fevers or chills, dysuria, cough runny nose or sore throat.  She denies any new medications although she does report that her opiate dosing was increased due to her chronic pain.  No other new symptoms.  No fall.   Past Medical History Past Medical History:  Diagnosis Date   Acute hypoxic respiratory failure 06/03/2022   Acute respiratory failure 04/13/2012   Acute respiratory failure with hypoxia and hypercapnia 07/08/2012   Anxiety    Atrial fibrillation with RVR 02/05/2020   Atrial flutter 06/04/2022   Bipolar 1 disorder    Cancer    colon   Cellulitis of right lower leg    Chronic diarrhea    Followed by Dr Bosie Clos    Chronic pain 06/05/2022   Community acquired bacterial pneumonia 06/10/2022   Complication of anesthesia    Hx resp distress during surgery   COPD (chronic obstructive pulmonary disease)    COPD exacerbation 04/13/2012   Depression    Diabetes mellitus    External hemorrhoids    Gangrene of toe of right foot    GI bleed    Headache(784.0)    Heart failure with mid-range ejection fraction 06/10/2022   Hyperlipidemia    Hypertension    Hypothyroidism    Insomnia    Internal hemorrhoids    Necrotizing fasciitis of lower leg    Non-insulin dependent type 2 diabetes mellitus 04/13/2012   Polysubstance abuse Quit 2008    H/O methamphetamine and alcohol  abuse, clean x 6 years   Sepsis 01/26/2020   Shortness of breath    Suicidal thoughts    Tobacco abuse    Tubular adenoma of colon    Urinary retention 06/04/2022   Patient Active Problem List   Diagnosis Date Noted   Hypermagnesemia 09/14/2022   Heart failure with mid-range ejection fraction 06/10/2022   Community acquired bacterial pneumonia 06/10/2022   Chronic pain 06/05/2022   Atrial flutter 06/04/2022   Urinary retention 06/04/2022   Acute hypoxic respiratory failure 06/03/2022   Osteoarthritis of right hip 03/09/2022   Pain in joint of right hip 03/09/2022   Pain in joint of left knee 03/09/2022   Palliative care by specialist    Goals of care, counseling/discussion    DNR (do not resuscitate)    Arthritis of left knee 05/10/2017   COPD still smoking/ pft's pending  12/27/2012   Chronic respiratory failure 12/27/2012   Personal history of colonic polyps 12/11/2012   Chronic diarrhea 12/11/2012   Benign neoplasm of colon 12/11/2012   Alcohol abuse 12/04/2012   Hyperlipidemia 07/08/2012   COPD exacerbation 04/13/2012   Low back pain 04/13/2012   Non-insulin dependent type 2 diabetes mellitus 04/13/2012   Tobacco abuse 04/13/2012   Hypertension    Bipolar 1 disorder (HCC)    Insomnia    Depression    Suicidal thoughts    Home Medication(s) Prior to Admission  medications   Medication Sig Start Date End Date Taking? Authorizing Provider  albuterol (PROVENTIL) (5 MG/ML) 0.5% nebulizer solution Take 2.5 mg by nebulization every 6 (six) hours as needed for wheezing or shortness of breath. 04/16/12  Yes Osvaldo Shipper, MD  amLODipine (NORVASC) 5 MG tablet Take 5 mg by mouth daily.   Yes [provider]  apixaban (ELIQUIS) 5 MG TABS tablet Take 1 tablet (5 mg total) by mouth 2 (two) times daily. 06/10/22  Yes Paige, Victoria J, DO  atorvastatin (LIPITOR) 20 MG tablet Take 20 mg by mouth at bedtime.   Yes [provider]  benztropine (COGENTIN) 0.5 MG  tablet Take 1 tablet (0.5 mg total) by mouth 2 (two) times daily. 02/27/20  Yes Regalado, Belkys A, MD  busPIRone (BUSPAR) 10 MG tablet Take 10 mg by mouth 3 (three) times daily. 09/20/20  Yes [provider]  COMBIVENT RESPIMAT 20-100 MCG/ACT AERS respimat Inhale 2 puffs into the lungs every 6 (six) hours as needed for wheezing or shortness of breath.   Yes [provider]  divalproex (DEPAKOTE) 500 MG DR tablet Take 500 mg by mouth 2 (two) times daily. 09/20/20  Yes [provider]  fluconazole (DIFLUCAN) 150 MG tablet Take 150 mg by mouth daily. 09/04/22  Yes [provider]  Fluticasone-Umeclidin-Vilant (TRELEGY ELLIPTA) 100-62.5-25 MCG/ACT AEPB Take 1 Inhalation by mouth daily. 06/10/22  Yes Paige, Lucas Mallow, DO  furosemide (LASIX) 40 MG tablet Take 1 tablet (40 mg total) by mouth daily. 06/11/22  Yes Paige, Lucas Mallow, DO  HYDROcodone-acetaminophen (NORCO) 10-325 MG tablet Take 1 tablet by mouth 4 (four) times daily as needed. 09/07/22  Yes [provider]  ketoconazole (NIZORAL) 2 % shampoo Apply topically 2 (two) times a week. 09/04/22  Yes [provider]  lisinopril-hydrochlorothiazide (ZESTORETIC) 10-12.5 MG tablet Take 1 tablet by mouth daily.   Yes [provider]  magnesium oxide (MAG-OX) 400 MG tablet Take 400 mg by mouth daily. 02/27/22  Yes [provider]  metoprolol tartrate (LOPRESSOR) 25 MG tablet Take 1 tablet (25 mg total) by mouth 2 (two) times daily. 06/11/22  Yes Vonna Drafts, MD  OXYGEN Inhale 2 L into the lungs daily.   Yes [provider]  QUEtiapine (SEROQUEL) 50 MG tablet Take 50 mg by mouth See admin instructions. Take 1/2 tablet twice daily and 1 tablet at bedtime   Yes [provider]  sertraline (ZOLOFT) 100 MG tablet Take 200 mg by mouth daily.    Yes [provider]  TIADYLT ER 180 MG 24 hr capsule Take 360 mg by mouth daily. 07/29/22  Yes [provider]  tiZANidine  (ZANAFLEX) 4 MG tablet Take 4 mg by mouth daily. 08/24/22  Yes [provider]  Vitamin D, Ergocalciferol, (DRISDOL) 1.25 MG (50000 UNIT) CAPS capsule Take 50,000 Units by mouth once a week. Mondays 04/27/22  Yes [provider]  diltiazem (CARDIZEM CD) 180 MG 24 hr capsule Take 2 capsules (360 mg total) by mouth daily. Patient not taking: Reported on 09/14/2022 06/10/22 08/09/22  Cora Collum, DO  Past Surgical History Past Surgical History:  Procedure Laterality Date   ABDOMINAL HYSTERECTOMY     APPENDECTOMY     BUBBLE STUDY  06/10/2022   Procedure: BUBBLE STUDY;  Surgeon: Parke Poisson, MD;  Location: [facility] ENDOSCOPY;  Service: Cardiovascular;;   CARDIOVERSION N/A 06/10/2022   Procedure: CARDIOVERSION;  Surgeon: Parke Poisson, MD;  Location: [facility] ENDOSCOPY;  Service: Cardiovascular;  Laterality: N/A;   COLON SURGERY     "colon repair"   COLONOSCOPY WITH PROPOFOL N/A 12/11/2012   Procedure: COLONOSCOPY WITH PROPOFOL;  Surgeon: Beverley Fiedler, MD;  Location: [facility] ENDOSCOPY;  Service: Gastroenterology;  Laterality: N/A;   FINGER SURGERY     middle left finger   HERNIA REPAIR     x 2   KNEE SURGERY Left    Left knee tendon surgery   TEE WITHOUT CARDIOVERSION N/A 06/10/2022   Procedure: TRANSESOPHAGEAL ECHOCARDIOGRAM (TEE);  Surgeon: Parke Poisson, MD;  Location: [facility] ENDOSCOPY;  Service: Cardiovascular;  Laterality: N/A;   TONSILLECTOMY     TUBAL LIGATION     WOUND DEBRIDEMENT Right 02/06/2020   Procedure: SKIN BIOPSY WITH DEBRIDEMENT RIGHT LOWER LEG;  Surgeon: Lucretia Roers, MD;  Location: [facility] ORS;  Service: General;  Laterality: Right;   Family History Family History  Problem Relation Age of Onset   Multiple sclerosis Mother    Alcoholism Father    Depression Brother    Emphysema Maternal Grandfather        smoked   Breast cancer  Neg Hx    Colon cancer Neg Hx    Esophageal cancer Neg Hx    Colon polyps Neg Hx    Stomach cancer Neg Hx    Pancreatic cancer Neg Hx    Liver disease Neg Hx     Social History Social History   Tobacco Use   Smoking status: Every Day    Packs/day: 1.00    Years: 42.00    Additional pack years: 0.00    Total pack years: 42.00    Types: Cigarettes   Smokeless tobacco: Never  Substance Use Topics   Alcohol use: Yes    Comment: occ   Drug use: No   Allergies Bactrim [sulfamethoxazole-trimethoprim], Amoxicillin, and Diclofenac  Review of Systems Review of Systems  All other systems reviewed and are negative.   Physical Exam Vital Signs  I have reviewed the triage vital signs BP 115/72   Pulse (!) 58   Temp 98 F (36.7 C) (Oral)   Resp (!) 33   Ht 5\' 5"  (1.651 m)   Wt 105 kg   SpO2 95%   BMI 38.52 kg/m  Physical Exam Vitals and nursing note reviewed.  Constitutional:      General: She is not in acute distress.    Appearance: She is well-developed.  HENT:     Head: Normocephalic and atraumatic.     Mouth/Throat:     Mouth: Mucous membranes are moist.  Eyes:     Pupils: Pupils are equal, round, and reactive to light.  Cardiovascular:     Rate and Rhythm: Normal rate and regular rhythm.     Heart sounds: No murmur heard. Pulmonary:     Effort: Pulmonary effort is normal. No respiratory distress.     Breath sounds: Normal breath sounds.  Abdominal:     General: Abdomen is flat.     Palpations: Abdomen is soft.     Tenderness: There is no abdominal tenderness.  Musculoskeletal:  General: No tenderness.     Right lower leg: No edema.     Left lower leg: No edema.  Skin:    General: Skin is warm and dry.  Neurological:     General: No focal deficit present.     Mental Status: She is alert and oriented to person, place, and time. Mental status is at baseline.     Comments: Cranial nerves II through XII intact, strength 5 out of 5 in the bilateral  upper and lower extremities, no sensory deficit to light touch, no dysmetria on finger-nose-finger testing  Psychiatric:        Mood and Affect: Mood normal.        Behavior: Behavior normal.     ED Results and Treatments Labs (all labs ordered are listed, but only abnormal results are displayed) Labs Reviewed  COMPREHENSIVE METABOLIC PANEL - Abnormal; Notable for the following components:      Result Value   Chloride 94 (*)    CO2 34 (*)    Glucose, Bld 136 (*)    BUN 91 (*)    Creatinine, Ser 1.53 (*)    GFR, Estimated 37 (*)    All other components within normal limits  CBC WITH DIFFERENTIAL/PLATELET - Abnormal; Notable for the following components:   RBC 3.33 (*)    Hemoglobin 9.1 (*)    HCT 29.7 (*)    All other components within normal limits  BLOOD GAS, VENOUS - Abnormal; Notable for the following components:   pH, Ven 7.44 (*)    pO2, Ven 54 (*)    Bicarbonate 40.8 (*)    Acid-Base Excess 14.7 (*)    All other components within normal limits  URINALYSIS, W/ REFLEX TO CULTURE (INFECTION SUSPECTED) - Abnormal; Notable for the following components:   Leukocytes,Ua SMALL (*)    Bacteria, UA RARE (*)    All other components within normal limits  MAGNESIUM - Abnormal; Notable for the following components:   Magnesium 3.6 (*)    All other components within normal limits  PROTIME-INR - Abnormal; Notable for the following components:   Prothrombin Time 18.5 (*)    INR 1.6 (*)    All other components within normal limits                                                                                                                          Radiology CT Head Wo Contrast  Result Date: 09/14/2022 CLINICAL DATA:  Mental status change, unknown cause EXAM: CT HEAD WITHOUT CONTRAST TECHNIQUE: Contiguous axial images were obtained from the base of the skull through the vertex without intravenous contrast. RADIATION DOSE REDUCTION: This exam was performed according to the  departmental dose-optimization program which includes automated exposure control, adjustment of the mA and/or kV according to patient size and/or use of iterative reconstruction technique. COMPARISON:  08/29/2012 FINDINGS: Brain: No acute intracranial abnormality. Specifically, no hemorrhage, hydrocephalus, mass lesion, acute infarction, or significant intracranial injury. Vascular:  No hyperdense vessel or unexpected calcification. Skull: No acute calvarial abnormality. Sinuses/Orbits: No acute findings Other: None IMPRESSION: Normal study. Electronically Signed   By: Charlett Nose M.D.   On: 09/14/2022 19:13   DG Chest Port 1 View  Result Date: 09/14/2022 CLINICAL DATA:  Altered mental status.  Confusion. EXAM: PORTABLE CHEST 1 VIEW COMPARISON:  06/05/2022 FINDINGS: Cardiomegaly as seen previously. Chronic interstitial lung markings. There may be an element of pulmonary venous hypertension but there is no frank edema. No infiltrate, collapse or effusion. IMPRESSION: Cardiomegaly. Chronic interstitial lung markings. Possible pulmonary venous hypertension. No frank edema. Electronically Signed   By: Paulina Fusi M.D.   On: 09/14/2022 18:37    Pertinent labs & imaging results that were available during my care of the patient were reviewed by me and considered in my medical decision making (see MDM for details).  Medications Ordered in ED Medications  0.9 %  sodium chloride infusion (has no administration in time range)  furosemide (LASIX) injection 40 mg (has no administration in time range)  sodium chloride 0.9 % bolus 1,000 mL (1,000 mLs Intravenous New Bag/Given 09/14/22 1953)                                                                                                                                     Procedures Procedures  (including critical care time)  Medical Decision Making / ED Course   MDM:  67 year old female presenting with fatigue.  Patient overall well-appearing, vital signs  with borderline low blood pressure but not truly low.  She is stable on her home oxygen.  Her examination is overall unremarkable.  Her neurologic exam seems intact and she is oriented x 3.  Differential includes medication effect from increased opioid medication at home, occult infectious process will check urinalysis and chest x-ray although patient denies dysuria, will check labs to evaluate for toxic or metabolic abnormality such as hyponatremia which may cause her weakness and fatigue.  Given reassuring exam and if laboratory workup is overall reassuring, may be able to discharge home and follow-up with her primary physician.  Clinical Course as of 09/14/22 2203  Wed Sep 14, 2022  2027 Labs notable for signs of mild dehydration and elevated magnesium level.  Magnesium level could potentially cause mild tremor and fatigue.  She is taking supplemental magnesium.  Discussed with patient that she should stop this.  Will give 1 L fluid bolus for dehydration.  Patient attempted ambulation trial in the emergency department but was unable to walk.  She lives in independent senior living.  Daughter does not think she has enough help at home and she is not able to take the patient home.  Discussed with the hospitalist who will admit. [WS]    Clinical Course User Index [WS] Lonell Grandchild, MD     Additional history obtained: -Additional history obtained from ems -External records from outside source obtained and  reviewed including: Chart review including previous notes, labs, imaging, consultation notes including prior laboratory results   Lab Tests: -I ordered, reviewed, and interpreted labs.   The pertinent results include:   Labs Reviewed  COMPREHENSIVE METABOLIC PANEL - Abnormal; Notable for the following components:      Result Value   Chloride 94 (*)    CO2 34 (*)    Glucose, Bld 136 (*)    BUN 91 (*)    Creatinine, Ser 1.53 (*)    GFR, Estimated 37 (*)    All other components  within normal limits  CBC WITH DIFFERENTIAL/PLATELET - Abnormal; Notable for the following components:   RBC 3.33 (*)    Hemoglobin 9.1 (*)    HCT 29.7 (*)    All other components within normal limits  BLOOD GAS, VENOUS - Abnormal; Notable for the following components:   pH, Ven 7.44 (*)    pO2, Ven 54 (*)    Bicarbonate 40.8 (*)    Acid-Base Excess 14.7 (*)    All other components within normal limits  URINALYSIS, W/ REFLEX TO CULTURE (INFECTION SUSPECTED) - Abnormal; Notable for the following components:   Leukocytes,Ua SMALL (*)    Bacteria, UA RARE (*)    All other components within normal limits  MAGNESIUM - Abnormal; Notable for the following components:   Magnesium 3.6 (*)    All other components within normal limits  PROTIME-INR - Abnormal; Notable for the following components:   Prothrombin Time 18.5 (*)    INR 1.6 (*)    All other components within normal limits    Notable for acute kidney injury, hypermagnesemia   EKG   EKG Interpretation  Date/Time:  Wednesday September 14 2022 17:23:53 EDT Ventricular Rate:  54 PR Interval:  219 QRS Duration: 111 QT Interval:  479 QTC Calculation: 454 R Axis:   36 Text Interpretation: Sinus rhythm Ventricular premature complex Borderline prolonged PR interval Abnormal R-wave progression, early transition Baseline wander in lead(s) V5 Confirmed by Alvino Blood ([id number]) on 09/14/2022 7:16:56 PM         Imaging Studies ordered: I ordered imaging studies including CXR, CT head On my interpretation imaging demonstrates no acute process I independently visualized and interpreted imaging. I agree with the radiologist interpretation   Medicines ordered and prescription drug management: Meds ordered this encounter  Medications   sodium chloride 0.9 % bolus 1,000 mL   0.9 %  sodium chloride infusion   furosemide (LASIX) injection 40 mg    -I have reviewed the patients home medicines and have made adjustments as  needed   Consultations Obtained: I requested consultation with the hospitalist,  and discussed lab and imaging findings as well as pertinent plan - they recommend: admission   Cardiac Monitoring: The patient was maintained on a cardiac monitor.  I personally viewed and interpreted the cardiac monitored which showed an underlying rhythm of: NSR  Social Determinants of Health:  Diagnosis or treatment significantly limited by social determinants of health: obesity and lives alone   Reevaluation: After the interventions noted above, I reevaluated the patient and found that their symptoms have improved  Co morbidities that complicate the patient evaluation  Past Medical History:  Diagnosis Date   Acute hypoxic respiratory failure 06/03/2022   Acute respiratory failure 04/13/2012   Acute respiratory failure with hypoxia and hypercapnia 07/08/2012   Anxiety    Atrial fibrillation with RVR 02/05/2020   Atrial flutter 06/04/2022   Bipolar 1 disorder  Cancer    colon   Cellulitis of right lower leg    Chronic diarrhea    Followed by Dr Bosie Clos    Chronic pain 06/05/2022   Community acquired bacterial pneumonia 06/10/2022   Complication of anesthesia    Hx resp distress during surgery   COPD (chronic obstructive pulmonary disease)    COPD exacerbation 04/13/2012   Depression    Diabetes mellitus    External hemorrhoids    Gangrene of toe of right foot    GI bleed    Headache(784.0)    Heart failure with mid-range ejection fraction 06/10/2022   Hyperlipidemia    Hypertension    Hypothyroidism    Insomnia    Internal hemorrhoids    Necrotizing fasciitis of lower leg    Non-insulin dependent type 2 diabetes mellitus 04/13/2012   Polysubstance abuse Quit 2008    H/O methamphetamine and alcohol abuse, clean x 6 years   Sepsis 01/26/2020   Shortness of breath    Suicidal thoughts    Tobacco abuse    Tubular adenoma of colon    Urinary retention 06/04/2022       Dispostion: Disposition decision including need for hospitalization was considered, and patient admitted to the hospital.    Final Clinical Impression(s) / ED Diagnoses Final diagnoses:  Dehydration  Hypermagnesemia     This chart was dictated using voice recognition software.  Despite best efforts to proofread,  errors can occur which can change the documentation meaning.    Lonell Grandchild, MD 09/14/22 2203

## 2022-09-14 NOTE — ED Triage Notes (Signed)
Per family pt started being altered over the last week. Pt also has tremors.

## 2022-09-14 NOTE — ED Notes (Addendum)
Report called to [facility] RN

## 2022-09-14 NOTE — Discharge Instructions (Addendum)
[name] [name],  You were in the hospital with fatigue, confusion and tremors. This appears to be related to a high magnesium level. Please stop taking your magnesium. You also developed some breathing issues which appear to be related to a COPD exacerbation. You have improved with treatment. Please continue your steroids and antibiotic. Please follow-up with your PCP.

## 2022-09-15 DIAGNOSIS — E782 Mixed hyperlipidemia: Secondary | ICD-10-CM

## 2022-09-15 DIAGNOSIS — E039 Hypothyroidism, unspecified: Secondary | ICD-10-CM | POA: Diagnosis present

## 2022-09-15 DIAGNOSIS — E669 Obesity, unspecified: Secondary | ICD-10-CM | POA: Diagnosis present

## 2022-09-15 DIAGNOSIS — I10 Essential (primary) hypertension: Secondary | ICD-10-CM

## 2022-09-15 DIAGNOSIS — E119 Type 2 diabetes mellitus without complications: Secondary | ICD-10-CM | POA: Diagnosis present

## 2022-09-15 DIAGNOSIS — I4892 Unspecified atrial flutter: Secondary | ICD-10-CM | POA: Diagnosis present

## 2022-09-15 DIAGNOSIS — I483 Typical atrial flutter: Secondary | ICD-10-CM

## 2022-09-15 DIAGNOSIS — E785 Hyperlipidemia, unspecified: Secondary | ICD-10-CM | POA: Diagnosis present

## 2022-09-15 DIAGNOSIS — J9621 Acute and chronic respiratory failure with hypoxia: Secondary | ICD-10-CM | POA: Diagnosis present

## 2022-09-15 DIAGNOSIS — D649 Anemia, unspecified: Secondary | ICD-10-CM | POA: Diagnosis not present

## 2022-09-15 DIAGNOSIS — E860 Dehydration: Secondary | ICD-10-CM | POA: Diagnosis present

## 2022-09-15 DIAGNOSIS — I5022 Chronic systolic (congestive) heart failure: Secondary | ICD-10-CM | POA: Diagnosis present

## 2022-09-15 DIAGNOSIS — Z7901 Long term (current) use of anticoagulants: Secondary | ICD-10-CM | POA: Diagnosis not present

## 2022-09-15 DIAGNOSIS — E873 Alkalosis: Secondary | ICD-10-CM | POA: Diagnosis present

## 2022-09-15 DIAGNOSIS — N179 Acute kidney failure, unspecified: Secondary | ICD-10-CM

## 2022-09-15 DIAGNOSIS — G8929 Other chronic pain: Secondary | ICD-10-CM | POA: Diagnosis present

## 2022-09-15 DIAGNOSIS — J449 Chronic obstructive pulmonary disease, unspecified: Secondary | ICD-10-CM

## 2022-09-15 DIAGNOSIS — F17200 Nicotine dependence, unspecified, uncomplicated: Secondary | ICD-10-CM

## 2022-09-15 DIAGNOSIS — R251 Tremor, unspecified: Secondary | ICD-10-CM | POA: Diagnosis present

## 2022-09-15 DIAGNOSIS — Z79899 Other long term (current) drug therapy: Secondary | ICD-10-CM | POA: Diagnosis not present

## 2022-09-15 DIAGNOSIS — Z880 Allergy status to penicillin: Secondary | ICD-10-CM | POA: Diagnosis not present

## 2022-09-15 DIAGNOSIS — I110 Hypertensive heart disease with heart failure: Secondary | ICD-10-CM | POA: Diagnosis present

## 2022-09-15 DIAGNOSIS — Z882 Allergy status to sulfonamides status: Secondary | ICD-10-CM | POA: Diagnosis not present

## 2022-09-15 DIAGNOSIS — J441 Chronic obstructive pulmonary disease with (acute) exacerbation: Secondary | ICD-10-CM | POA: Diagnosis present

## 2022-09-15 DIAGNOSIS — F39 Unspecified mood [affective] disorder: Secondary | ICD-10-CM

## 2022-09-15 DIAGNOSIS — F17210 Nicotine dependence, cigarettes, uncomplicated: Secondary | ICD-10-CM | POA: Diagnosis present

## 2022-09-15 DIAGNOSIS — I4891 Unspecified atrial fibrillation: Secondary | ICD-10-CM | POA: Diagnosis present

## 2022-09-15 DIAGNOSIS — F419 Anxiety disorder, unspecified: Secondary | ICD-10-CM | POA: Diagnosis present

## 2022-09-15 DIAGNOSIS — F319 Bipolar disorder, unspecified: Secondary | ICD-10-CM | POA: Diagnosis present

## 2022-09-15 LAB — BRAIN NATRIURETIC PEPTIDE: B Natriuretic Peptide: 484 pg/mL — ABNORMAL HIGH (ref 0.0–100.0)

## 2022-09-15 LAB — BLOOD GAS, ARTERIAL
Acid-Base Excess: 15.6 mmol/L — ABNORMAL HIGH (ref 0.0–2.0)
Bicarbonate: 42.2 mmol/L — ABNORMAL HIGH (ref 20.0–28.0)
Drawn by: 23430
O2 Saturation: 92.2 %
Patient temperature: 37.1
pCO2 arterial: 58 mmHg — ABNORMAL HIGH (ref 32–48)
pH, Arterial: 7.47 — ABNORMAL HIGH (ref 7.35–7.45)
pO2, Arterial: 57 mmHg — ABNORMAL LOW (ref 83–108)

## 2022-09-15 LAB — CBC WITH DIFFERENTIAL/PLATELET
Abs Immature Granulocytes: 0.05 10*3/uL (ref 0.00–0.07)
Basophils Absolute: 0.1 10*3/uL (ref 0.0–0.1)
Basophils Relative: 1 %
Eosinophils Absolute: 0.2 10*3/uL (ref 0.0–0.5)
Eosinophils Relative: 3 %
HCT: 31.1 % — ABNORMAL LOW (ref 36.0–46.0)
Hemoglobin: 9.3 g/dL — ABNORMAL LOW (ref 12.0–15.0)
Immature Granulocytes: 1 %
Lymphocytes Relative: 21 %
Lymphs Abs: 1 10*3/uL (ref 0.7–4.0)
MCH: 27.3 pg (ref 26.0–34.0)
MCHC: 29.9 g/dL — ABNORMAL LOW (ref 30.0–36.0)
MCV: 91.2 fL (ref 80.0–100.0)
Monocytes Absolute: 0.3 10*3/uL (ref 0.1–1.0)
Monocytes Relative: 6 %
Neutro Abs: 3.2 10*3/uL (ref 1.7–7.7)
Neutrophils Relative %: 68 %
Platelets: 157 10*3/uL (ref 150–400)
RBC: 3.41 MIL/uL — ABNORMAL LOW (ref 3.87–5.11)
RDW: 14.6 % (ref 11.5–15.5)
WBC: 4.7 10*3/uL (ref 4.0–10.5)
nRBC: 0 % (ref 0.0–0.2)

## 2022-09-15 LAB — COMPREHENSIVE METABOLIC PANEL
ALT: 15 U/L (ref 0–44)
AST: 16 U/L (ref 15–41)
Albumin: 3.7 g/dL (ref 3.5–5.0)
Alkaline Phosphatase: 57 U/L (ref 38–126)
Anion gap: 11 (ref 5–15)
BUN: 76 mg/dL — ABNORMAL HIGH (ref 8–23)
CO2: 35 mmol/L — ABNORMAL HIGH (ref 22–32)
Calcium: 9.1 mg/dL (ref 8.9–10.3)
Chloride: 95 mmol/L — ABNORMAL LOW (ref 98–111)
Creatinine, Ser: 1.13 mg/dL — ABNORMAL HIGH (ref 0.44–1.00)
GFR, Estimated: 54 mL/min — ABNORMAL LOW (ref >60)
Glucose, Bld: 122 mg/dL — ABNORMAL HIGH (ref 70–99)
Potassium: 4.3 mmol/L (ref 3.5–5.1)
Sodium: 141 mmol/L (ref 135–145)
Total Bilirubin: 0.6 mg/dL (ref 0.3–1.2)
Total Protein: 7.2 g/dL (ref 6.5–8.1)

## 2022-09-15 LAB — MAGNESIUM: Magnesium: 3.1 mg/dL — ABNORMAL HIGH (ref 1.7–2.4)

## 2022-09-15 LAB — RAPID URINE DRUG SCREEN, HOSP PERFORMED
Amphetamines: NOT DETECTED
Barbiturates: NOT DETECTED
Benzodiazepines: NOT DETECTED
Cocaine: NOT DETECTED
Opiates: POSITIVE — AB
Tetrahydrocannabinol: NOT DETECTED

## 2022-09-15 LAB — TSH: TSH: 3.491 u[IU]/mL (ref 0.350–4.500)

## 2022-09-15 MED ORDER — METHYLPREDNISOLONE SODIUM SUCC 125 MG IJ SOLR
125.0000 mg | Freq: Once | INTRAMUSCULAR | Status: AC
  Administered 2022-09-15: 125 mg via INTRAVENOUS
  Filled 2022-09-15: qty 2

## 2022-09-15 MED ORDER — NICOTINE 14 MG/24HR TD PT24
14.0000 mg | MEDICATED_PATCH | Freq: Every day | TRANSDERMAL | Status: DC
  Filled 2022-09-15 (×3): qty 1

## 2022-09-15 NOTE — Progress Notes (Signed)
PROGRESS NOTE    [name] [name]  AVW:[id number] DOB: 28-Apr-1956 DOA: 09/14/2022 PCP: [facility]   Brief Narrative: [name] [name] is a 67 y.o. female with a history of chronic respiratory failure, COPD, atrial flutter/fibrillation, bipolar disorder, hypertension, hypothyroidism, hyperlipidemia, chronic pain. Patient presented secondary to tremors and anxiety and was found to have mild hypermagnesemia with associated AKI. IV fluids started. Patient also found to have some mild confusion of unclear etiology.   Assessment and Plan:  Hypermagnesemia Mild. Magnesium of 3.6. Secondary to exogenous intake from supplement. Magnesium down to 3.1 today with IV fluids and Lasix. -Recheck magnesium in AM  Confusion Unclear etiology. Per daughter, patient has not been her since since about 6 days prior to admission, but did not seek medical evaluation because they were out of town. Possible medication effect vs infection vs substance use vs possibly from mild hypermagnesemia. CT head was unremarkable on admission. ABG obtained this morning and shows mild alkalosis but mostly compensated chronic hypercapnia. -Check urine culture -Avoid mental status altering medications  COPD with mild exacerbation Patient with mild wheezing. Episode of significant dyspnea soon after admission that responded to increased oxygen (6 L/min) and albuterol nebulizer treatment. -Solu-medrol IV x1 -Continue albuterol -If patient develops change in sputum, will add antibiotic coverage  Acute on chronic respiratory failure with hypoxia Patient requiring increase of oxygen to supplementation to 6 L/min with associated dyspnea and SpO2 of 85% on 3 L/min of oxygen. Patient weaned back to baseline rate of 3 L/min after treatment of likely mild COPD exacerbation.  Atrial flutter/fibrillation -Continue diltiazem and Eliquis  Bipolar disorder Depression Anxiety -Continue home Zoloft, Seroquel, Depakote  and BuSpar  Chronic anemia Stable.  AKI Creatinine of 1.53 on admission with associated BUN of 91.   Nicotine use -Continue nicotine patch  Hyperlipidemia -Continue Lipitor  Primary hypertension -Continue amlodipine, hydrochlorothiazide, and diltiazem   DVT prophylaxis: Eliquis Code Status:   Code Status: Full Code Family Communication: Daughter at bedside Disposition Plan: Discharge pending improvement of confusion and continued workup   Consultants:  None  Procedures:  None  Antimicrobials: None    Subjective: Patient reports not feeling well. Some mild dyspnea. Daughter concerned that patient is not acting her normal self and seems confused. Daughter also states symptoms started 6 days prior to patient presentation for evaluation in the hospital.  Objective: BP 135/66   Pulse 67   Temp 98.4 F (36.9 C) (Oral)   Resp 18   Ht  (1.651 m)   Wt 105 kg   SpO2 94%   BMI 38.52 kg/m   Examination:  General exam: Appears calm and comfortable Respiratory system: Mild wheezing. Mild tachypnea noted. Cardiovascular system: S1 & S2 heard, RRR. Gastrointestinal system: Abdomen is nondistended, soft and nontender. Normal bowel sounds heard. Central nervous system: Alert and oriented to self. Musculoskeletal: No edema. No calf tenderness Skin: No cyanosis. No rashes Psychiatry: Judgement and insight appear normal. Mood & affect appropriate.    Data Reviewed: I have personally reviewed following labs and imaging studies  CBC Lab Results  Component Value Date   WBC 4.7 09/15/2022   RBC 3.41 (L) 09/15/2022   HGB 9.3 (L) 09/15/2022   HCT 31.1 (L) 09/15/2022   MCV 91.2 09/15/2022   MCH 27.3 09/15/2022   PLT 157 09/15/2022   MCHC 29.9 (L) 09/15/2022   RDW 14.6 09/15/2022   LYMPHSABS 1.0 09/15/2022   MONOABS 0.3 09/15/2022   EOSABS 0.2 09/15/2022   BASOSABS 0.1 09/15/2022  Last metabolic panel Lab Results  Component Value Date   NA 141 09/15/2022    K 4.3 09/15/2022   CL 95 (L) 09/15/2022   CO2 35 (H) 09/15/2022   BUN 76 (H) 09/15/2022   CREATININE 1.13 (H) 09/15/2022   GLUCOSE 122 (H) 09/15/2022   GFRNONAA 54 (L) 09/15/2022   GFRAA NOT CALCULATED 02/25/2020   CALCIUM 9.1 09/15/2022   PHOS 3.4 02/11/2020   PROT 7.2 09/15/2022   ALBUMIN 3.7 09/15/2022   BILITOT 0.6 09/15/2022   ALKPHOS 57 09/15/2022   AST 16 09/15/2022   ALT 15 09/15/2022   ANIONGAP 11 09/15/2022    GFR: Estimated Creatinine Clearance: 58.9 mL/min (A) (by C-G formula based on SCr of 1.13 mg/dL (H)).  No results found for this or any previous visit (from the past 240 hour(s)).    Radiology Studies: CT Head Wo Contrast  Result Date: 09/14/2022 CLINICAL DATA:  Mental status change, unknown cause EXAM: CT HEAD WITHOUT CONTRAST TECHNIQUE: Contiguous axial images were obtained from the base of the skull through the vertex without intravenous contrast. RADIATION DOSE REDUCTION: This exam was performed according to the departmental dose-optimization program which includes automated exposure control, adjustment of the mA and/or kV according to patient size and/or use of iterative reconstruction technique. COMPARISON:  08/29/2012 FINDINGS: Brain: No acute intracranial abnormality. Specifically, no hemorrhage, hydrocephalus, mass lesion, acute infarction, or significant intracranial injury. Vascular: No hyperdense vessel or unexpected calcification. Skull: No acute calvarial abnormality. Sinuses/Orbits: No acute findings Other: None IMPRESSION: Normal study. Electronically Signed   By: Charlett Nose M.D.   On: 09/14/2022 19:13   DG Chest Port 1 View  Result Date: 09/14/2022 CLINICAL DATA:  Altered mental status.  Confusion. EXAM: PORTABLE CHEST 1 VIEW COMPARISON:  06/05/2022 FINDINGS: Cardiomegaly as seen previously. Chronic interstitial lung markings. There may be an element of pulmonary venous hypertension but there is no frank edema. No infiltrate, collapse or effusion.  IMPRESSION: Cardiomegaly. Chronic interstitial lung markings. Possible pulmonary venous hypertension. No frank edema. Electronically Signed   By: Paulina Fusi M.D.   On: 09/14/2022 18:37      LOS: 0 days    Jacquelin Hawking, MD [facility] 09/15/2022, 11:28 AM   If 7PM-7AM, please contact night-coverage [email or web address]

## 2022-09-15 NOTE — Assessment & Plan Note (Signed)
-   Magnesium 3.6 - Hold magnesium supplementation from PTA med list - IV fluid hydration with Lasix to flush magnesium - Monitor on telemetry - Neurochecks every 2 - I personally spoke with nephrology who had no further recommendations at this time, and advised that day team could request nephrology consult if magnesium is not trending down with a.m. labs - Continue to monitor

## 2022-09-15 NOTE — Assessment & Plan Note (Signed)
-   Continue Zoloft, Seroquel, Depakote, BuSpar

## 2022-09-15 NOTE — Hospital Course (Addendum)
[name] [name] is a 67 y.o. female with a history of chronic respiratory failure, COPD, atrial flutter/fibrillation, bipolar disorder, hypertension, hypothyroidism, hyperlipidemia, chronic pain. Patient presented secondary to tremors and anxiety and was found to have mild hypermagnesemia with associated AKI. IV fluids started. Patient also found to have some mild confusion of unclear etiology. During hospitalization, patient developed symptoms consistent with a COPD exacerbation.

## 2022-09-15 NOTE — TOC Progression Note (Signed)
  Transition of Care [facility]) Screening Note   Patient Details  Name: Kashira Behunin Date of Birth: 05/23/1956   Transition of Care [facility]) CM/SW Contact:    Elliot Gault, LCSW Phone Number: 09/15/2022, 11:04 AM    Transition of Care Department [facility]) has reviewed patient and no TOC needs have been identified at this time. We will continue to monitor patient advancement through interdisciplinary progression rounds. If new patient transition needs arise, please place a TOC consult.

## 2022-09-15 NOTE — Assessment & Plan Note (Signed)
-   Creatinine increased from 0.84>>1.53 - with BUN : Cr ratio of 91:1.5, etiology would seem to be dehydration -Patient denies any change in PO intake -She is on diuretic at home - but reports no recent dose changes with that -Continue IV hydration -Holding nephrotoxic agents where possible -Continue to monitor

## 2022-09-15 NOTE — H&P (Signed)
History and Physical    Patient: [name] [name] AOZ:[id number] DOB: [birth year] DOA: 09/14/2022 DOS: the patient was seen and examined on 09/15/2022 PCP: [facility]  Patient coming from: Home  Chief Complaint:  Chief Complaint  Patient presents with   Altered Mental Status   HPI: [name] [name] is a 67 y.o. female with medical history significant of chronic respiratory failure on 2 L nasal cannula at baseline, COPD, atrial fibrillation, bipolar 1 disorder, hypertension, hypothyroidism, hyperlipidemia, chronic pain, and more presents the ED with a chief complaint of tremors and anxiety.  Patient is a poor historian.  She reports she has had shaking, twitching, anxiety for a week.  Later she reports has been going on for a month.  She reports that she saw her PCP on the 18th and they increased her pain medication dosage for this problem.  She is not sure when she started Cogentin or what she started it for.  Patient reports that she and her PCP attributed her symptoms to worsening hip pain.  Patient denies any recent chest pain or palpitations.  She did have palpitations 2 and half months ago when she was diagnosed with atrial fibrillation per her report.  She denies any muscle cramping.  She does report that her knees and elbows have been buckling, and when she describes it it sounds a lot like generalized weakness.  Patient reports that she has had normal appetite, no change in her p.o. intake.  She had normal urine output.  She reports that she has been constipated but that she had a bowel movement this morning and she is not sure when before that.  She does not take laxatives at home.  Patient reports she has chronic right lower extremity asymmetrical edema, but there has been no change.  Patient has no other complaints at this time.  Patient does smoke.  She uses a vape and she is not sure how much nicotine is in it.  She does not drink alcohol, does not use illicit drugs, was not  vaccinated for COVID.  Patient has a previous CODE STATUS of DNR, but when asked today she reports she would want everything done to save her. Review of Systems: As mentioned in the history of present illness. All other systems reviewed and are negative. Past Medical History:  Diagnosis Date   Acute hypoxic respiratory failure 06/03/2022   Acute respiratory failure 04/13/2012   Acute respiratory failure with hypoxia and hypercapnia 07/08/2012   Anxiety    Atrial fibrillation with RVR 02/05/2020   Atrial flutter 06/04/2022   Bipolar 1 disorder    Cancer    colon   Cellulitis of right lower leg    Chronic diarrhea    Followed by Dr Bosie Clos    Chronic pain 06/05/2022   Community acquired bacterial pneumonia 06/10/2022   Complication of anesthesia    Hx resp distress during surgery   COPD (chronic obstructive pulmonary disease)    COPD exacerbation 04/13/2012   Depression    Diabetes mellitus    External hemorrhoids    Gangrene of toe of right foot    GI bleed    Headache(784.0)    Heart failure with mid-range ejection fraction 06/10/2022   Hyperlipidemia    Hypertension    Hypothyroidism    Insomnia    Internal hemorrhoids    Necrotizing fasciitis of lower leg    Non-insulin dependent type 2 diabetes mellitus 04/13/2012   Polysubstance abuse Quit 2008    H/O methamphetamine and  alcohol abuse, clean x 6 years   Sepsis 01/26/2020   Shortness of breath    Suicidal thoughts    Tobacco abuse    Tubular adenoma of colon    Urinary retention 06/04/2022   Past Surgical History:  Procedure Laterality Date   ABDOMINAL HYSTERECTOMY     APPENDECTOMY     BUBBLE STUDY  06/10/2022   Procedure: BUBBLE STUDY;  Surgeon: Parke Poisson, MD;  Location: [facility] ENDOSCOPY;  Service: Cardiovascular;;   CARDIOVERSION N/A 06/10/2022   Procedure: CARDIOVERSION;  Surgeon: Parke Poisson, MD;  Location: [facility] ENDOSCOPY;  Service: Cardiovascular;  Laterality: N/A;   COLON SURGERY     "colon  repair"   COLONOSCOPY WITH PROPOFOL N/A 12/11/2012   Procedure: COLONOSCOPY WITH PROPOFOL;  Surgeon: Beverley Fiedler, MD;  Location: [facility] ENDOSCOPY;  Service: Gastroenterology;  Laterality: N/A;   FINGER SURGERY     middle left finger   HERNIA REPAIR     x 2   KNEE SURGERY Left    Left knee tendon surgery   TEE WITHOUT CARDIOVERSION N/A 06/10/2022   Procedure: TRANSESOPHAGEAL ECHOCARDIOGRAM (TEE);  Surgeon: Parke Poisson, MD;  Location: [facility] ENDOSCOPY;  Service: Cardiovascular;  Laterality: N/A;   TONSILLECTOMY     TUBAL LIGATION     WOUND DEBRIDEMENT Right 02/06/2020   Procedure: SKIN BIOPSY WITH DEBRIDEMENT RIGHT LOWER LEG;  Surgeon: Lucretia Roers, MD;  Location: [facility] ORS;  Service: General;  Laterality: Right;   Social History:  reports that she has been smoking cigarettes. She has a 42.00 pack-year smoking history. She has never used smokeless tobacco. She reports current alcohol use. She reports that she does not use drugs.  Allergies  Allergen Reactions   Bactrim [Sulfamethoxazole-Trimethoprim] Anaphylaxis   Amoxicillin Hives   Diclofenac Tinitus    swelling in feet    Family History  Problem Relation Age of Onset   Multiple sclerosis Mother    Alcoholism Father    Depression Brother    Emphysema Maternal Grandfather        smoked   Breast cancer Neg Hx    Colon cancer Neg Hx    Esophageal cancer Neg Hx    Colon polyps Neg Hx    Stomach cancer Neg Hx    Pancreatic cancer Neg Hx    Liver disease Neg Hx     Prior to Admission medications   Medication Sig Start Date End Date Taking? Authorizing Provider  albuterol (PROVENTIL) (5 MG/ML) 0.5% nebulizer solution Take 2.5 mg by nebulization every 6 (six) hours as needed for wheezing or shortness of breath. 04/16/12  Yes Osvaldo Shipper, MD  amLODipine (NORVASC) 5 MG tablet Take 5 mg by mouth daily.   Yes [provider]  apixaban (ELIQUIS) 5 MG TABS tablet Take 1 tablet (5 mg total) by mouth 2 (two) times daily.  06/10/22  Yes Paige, Victoria J, DO  atorvastatin (LIPITOR) 20 MG tablet Take 20 mg by mouth at bedtime.   Yes [provider]  benztropine (COGENTIN) 0.5 MG tablet Take 1 tablet (0.5 mg total) by mouth 2 (two) times daily. 02/27/20  Yes Regalado, Belkys A, MD  busPIRone (BUSPAR) 10 MG tablet Take 10 mg by mouth 3 (three) times daily. 09/20/20  Yes [provider]  COMBIVENT RESPIMAT 20-100 MCG/ACT AERS respimat Inhale 2 puffs into the lungs every 6 (six) hours as needed for wheezing or shortness of breath.   Yes [provider]  divalproex (DEPAKOTE) 500 MG DR tablet  Take 500 mg by mouth 2 (two) times daily. 09/20/20  Yes [provider]  fluconazole (DIFLUCAN) 150 MG tablet Take 150 mg by mouth daily. 09/04/22  Yes [provider]  Fluticasone-Umeclidin-Vilant (TRELEGY ELLIPTA) 100-62.5-25 MCG/ACT AEPB Take 1 Inhalation by mouth daily. 06/10/22  Yes Paige, Lucas Mallow, DO  furosemide (LASIX) 40 MG tablet Take 1 tablet (40 mg total) by mouth daily. 06/11/22  Yes Paige, Lucas Mallow, DO  HYDROcodone-acetaminophen (NORCO) 10-325 MG tablet Take 1 tablet by mouth 4 (four) times daily as needed. 09/07/22  Yes [provider]  ketoconazole (NIZORAL) 2 % shampoo Apply topically 2 (two) times a week. 09/04/22  Yes [provider]  lisinopril-hydrochlorothiazide (ZESTORETIC) 10-12.5 MG tablet Take 1 tablet by mouth daily.   Yes [provider]  magnesium oxide (MAG-OX) 400 MG tablet Take 400 mg by mouth daily. 02/27/22  Yes [provider]  metoprolol tartrate (LOPRESSOR) 25 MG tablet Take 1 tablet (25 mg total) by mouth 2 (two) times daily. 06/11/22  Yes Vonna Drafts, MD  OXYGEN Inhale 2 L into the lungs daily.   Yes [provider]  QUEtiapine (SEROQUEL) 50 MG tablet Take 50 mg by mouth See admin instructions. Take 1/2 tablet twice daily and 1 tablet at bedtime   Yes [provider]  sertraline (ZOLOFT) 100 MG tablet Take  200 mg by mouth daily.    Yes [provider]  TIADYLT ER 180 MG 24 hr capsule Take 360 mg by mouth daily. 07/29/22  Yes [provider]  tiZANidine (ZANAFLEX) 4 MG tablet Take 4 mg by mouth daily. 08/24/22  Yes [provider]  Vitamin D, Ergocalciferol, (DRISDOL) 1.25 MG (50000 UNIT) CAPS capsule Take 50,000 Units by mouth once a week. Mondays 04/27/22  Yes [provider]  diltiazem (CARDIZEM CD) 180 MG 24 hr capsule Take 2 capsules (360 mg total) by mouth daily. Patient not taking: Reported on 09/14/2022 06/10/22 08/09/22  Cora Collum, DO    Physical Exam: Vitals:   09/14/22 2145 09/14/22 2200 09/14/22 2208 09/14/22 2301  BP: (!) 111/56 (!) 86/63 (!) 130/49 (!) 126/49  Pulse: 67 64 71 65  Resp: Temp:    98.9 F (37.2 C)  TempSrc:    Oral  SpO2: 93% 99% 93% 94%  Weight:      Height:       1.  General: Patient lying supine in bed, constantly changing position, anxious appearing   2. Psychiatric: Alert and oriented x 3, mood and behavior are at baseline, pleasant and cooperative with exam   3. Neurologic: Speech and language are normal, face is symmetric, moves all 4 extremities voluntarily, at baseline without acute deficits on limited exam   4. HEENMT:  Psoriasis of scalp, head is atraumatic, normocephalic, pupils reactive to light, neck is supple, trachea is midline, mucous membranes are moist   5. Respiratory : Lungs are clear to auscultation bilaterally mild wheeze seen, but no rhonchi, rales, no cyanosis, no increase in work of breathing or accessory muscle use, on chronic 2 L nasal cannula   6. Cardiovascular : Heart rate normal, rhythm is regular, no murmurs, rubs or gallops, chronic asymmetrical edema in the lower extremities right greater than left, peripheral pulses palpated   7. Gastrointestinal:  Abdomen is soft, nondistended, nontender to palpation bowel sounds active, no masses or organomegaly palpated   8. Skin:   Skin is warm, dry and intact without rashes, acute lesions, or ulcers  on limited exam   9.Musculoskeletal:  No acute deformities or trauma, no asymmetry in tone,  peripheral pulses palpated, no tenderness to palpation in the extremities  Data Reviewed: In the ED Temp 98, heart rate 52-59, respiratory rate 16-19, blood pressure as low as 87/55 which would be consistent with dehydration, and up to 129/80 satting at 90-98% pH 7.4, pCO2 59, pO2 54 No leukocytosis with white blood cell count of 5.6, hemoglobin 9.1 Chemistry reveals a BUN of 91 and a creatinine of 1.53 UA is abnormal, but patient has no urinary symptoms, so interpreting this as negative for UTI at this time CT head shows no acute changes Chest x-ray shows cardiomegaly with no frank edema but possible vascular congestion Magnesium 3.6 EKG shows a heart rate of 54, sinus rhythm, QTc 454 Admission requested for hypomagnesemia Assessment and Plan: * Hypermagnesemia - Magnesium 3.6 - Hold magnesium supplementation from PTA med list - IV fluid hydration with Lasix to flush magnesium - Monitor on telemetry - Neurochecks every 2 - I personally spoke with nephrology who had no further recommendations at this time, and advised that day team could request nephrology consult if magnesium is not trending down with a.m. labs - Continue to monitor  Atrial flutter - Continue diltiazem, Eliquis - Continue to monitor  Mood disorder - Continue Zoloft, Seroquel, Depakote, BuSpar  Chronic anemia - Baseline hemoglobin seems to be around 9.5 - Today 9.1 - No signs or symptoms of bleeding - Trend in the a.m.  AKI (acute kidney injury) - Creatinine increased from 0.84>>1.53 - with BUN : Cr ratio of 91:1.5, etiology would seem to be dehydration -Patient denies any change in PO intake -She is on diuretic at home - but reports no recent dose changes with that -Continue IV hydration -Holding nephrotoxic agents where possible -Continue  to monitor   Nicotine use disorder - Patient continues to vape - Counseled on importance of cessation - Nicotine patch ordered  COPD (chronic obstructive pulmonary disease) - With mild wheeze on exam - Continue albuterol as needed - Continue Breo and Incruse - Continue to monitor  Hyperlipidemia - Continue statin  Hypertension - Continue hydrochlorothiazide, diltiazem, amlodipine      Advance Care Planning:   Code Status: Full Code  Consults: None at this time  Family Communication: No family at bedside  Severity of Illness: The appropriate patient status for this patient is OBSERVATION. Observation status is judged to be reasonable and necessary in order to provide the required intensity of service to ensure the patient's safety. The patient's presenting symptoms, physical exam findings, and initial radiographic and laboratory data in the context of their medical condition is felt to place them at decreased risk for further clinical deterioration. Furthermore, it is anticipated that the patient will be medically stable for discharge from the hospital within 2 midnights of admission.   Author: Lilyan Gilford, DO 09/15/2022 12:32 AM  For on call review [email or web address].

## 2022-09-15 NOTE — Assessment & Plan Note (Signed)
-   Continue diltiazem, Eliquis - Continue to monitor

## 2022-09-15 NOTE — Assessment & Plan Note (Signed)
-   Patient continues to vape - Counseled on importance of cessation - Nicotine patch ordered

## 2022-09-15 NOTE — Assessment & Plan Note (Signed)
-   Continue hydrochlorothiazide, diltiazem, amlodipine

## 2022-09-15 NOTE — Assessment & Plan Note (Signed)
-   With mild wheeze on exam - Continue albuterol as needed - Continue Breo and Incruse - Continue to monitor

## 2022-09-15 NOTE — Assessment & Plan Note (Signed)
-   Baseline hemoglobin seems to be around 9.5 - Today 9.1 - No signs or symptoms of bleeding - Trend in the a.m.

## 2022-09-15 NOTE — Progress Notes (Signed)
Called to room for breathing treatment. Upon arrival patient with notable shortness of breath and labored in her breathing. Audible wheezing heard. Bilateral expiratory wheezing heard on auscultation. O2 sat was initially 78% (patient's flowmeter didn't seem to be putting out adequate flow and she was mouth breathing) on 3 lpm. Placed patient on breathing treatment and started her on salter HFNC at 6 lpm. Stayed with patient after treatment to ensure adequate SPO2. Patient maintained at 95-96% on 6 lpm. Patient sounds better post treatment and work of breathing has decreased. RN made aware.

## 2022-09-15 NOTE — Assessment & Plan Note (Signed)
Continue statin. 

## 2022-09-16 ENCOUNTER — Inpatient Hospital Stay (HOSPITAL_COMMUNITY): Payer: 59

## 2022-09-16 DIAGNOSIS — J441 Chronic obstructive pulmonary disease with (acute) exacerbation: Secondary | ICD-10-CM | POA: Diagnosis not present

## 2022-09-16 LAB — BASIC METABOLIC PANEL
Anion gap: 10 (ref 5–15)
BUN: 63 mg/dL — ABNORMAL HIGH (ref 8–23)
CO2: 34 mmol/L — ABNORMAL HIGH (ref 22–32)
Calcium: 9.4 mg/dL (ref 8.9–10.3)
Chloride: 98 mmol/L (ref 98–111)
Creatinine, Ser: 0.98 mg/dL (ref 0.44–1.00)
GFR, Estimated: >60 mL/min (ref >60)
Glucose, Bld: 158 mg/dL — ABNORMAL HIGH (ref 70–99)
Potassium: 4.3 mmol/L (ref 3.5–5.1)
Sodium: 142 mmol/L (ref 135–145)

## 2022-09-16 LAB — RESPIRATORY PANEL BY PCR

## 2022-09-16 LAB — PROCALCITONIN: Procalcitonin: <0.1 ng/mL

## 2022-09-16 LAB — STREP PNEUMONIAE URINARY ANTIGEN: Strep Pneumo Urinary Antigen: NEGATIVE

## 2022-09-16 LAB — URINE CULTURE

## 2022-09-16 LAB — MAGNESIUM: Magnesium: 2.9 mg/dL — ABNORMAL HIGH (ref 1.7–2.4)

## 2022-09-16 MED ORDER — FUROSEMIDE 10 MG/ML IJ SOLN
20.0000 mg | Freq: Once | INTRAMUSCULAR | Status: AC
  Administered 2022-09-16: 20 mg via INTRAVENOUS
  Filled 2022-09-16: qty 2

## 2022-09-16 MED ORDER — SODIUM CHLORIDE 0.9 % IV SOLN
500.0000 mg | INTRAVENOUS | Status: DC
  Filled 2022-09-16: qty 5

## 2022-09-16 MED ORDER — SODIUM CHLORIDE 0.9 % IV SOLN
2.0000 g | Freq: Once | INTRAVENOUS | Status: DC
  Administered 2022-09-16: 2 g via INTRAVENOUS
  Filled 2022-09-16: qty 20

## 2022-09-16 MED ORDER — LEVOFLOXACIN IN D5W 750 MG/150ML IV SOLN
750.0000 mg | INTRAVENOUS | Status: DC
Start: 2022-09-16 — End: 2022-09-16

## 2022-09-16 MED ORDER — DOXYCYCLINE HYCLATE 100 MG PO TABS
100.0000 mg | ORAL_TABLET | Freq: Two times a day (BID) | ORAL | Status: DC
  Administered 2022-09-16 – 2022-09-17 (×3): 100 mg via ORAL
  Filled 2022-09-16 (×3): qty 1

## 2022-09-16 MED ORDER — SODIUM CHLORIDE 0.9 % IV SOLN: 1.0000 g | INTRAVENOUS | Status: DC

## 2022-09-16 MED ORDER — PREDNISONE 20 MG PO TABS
40.0000 mg | ORAL_TABLET | Freq: Every day | ORAL | Status: DC
  Administered 2022-09-16 – 2022-09-17 (×2): 40 mg via ORAL
  Filled 2022-09-16 (×2): qty 2

## 2022-09-16 MED ORDER — LEVOFLOXACIN IN D5W 750 MG/150ML IV SOLN: 750.0000 mg | INTRAVENOUS | Status: DC

## 2022-09-16 MED ORDER — IPRATROPIUM-ALBUTEROL 0.5-2.5 (3) MG/3ML IN SOLN
3.0000 mL | RESPIRATORY_TRACT | Status: DC
  Administered 2022-09-16: 3 mL via RESPIRATORY_TRACT
  Filled 2022-09-16 (×2): qty 3

## 2022-09-16 MED ORDER — IPRATROPIUM-ALBUTEROL 0.5-2.5 (3) MG/3ML IN SOLN
3.0000 mL | Freq: Four times a day (QID) | RESPIRATORY_TRACT | Status: DC
  Administered 2022-09-16: 3 mL via RESPIRATORY_TRACT
  Filled 2022-09-16: qty 3

## 2022-09-16 MED ORDER — SODIUM CHLORIDE 0.9 % IV BOLUS
1000.0000 mL | Freq: Once | INTRAVENOUS | Status: AC
  Administered 2022-09-16: 1000 mL via INTRAVENOUS

## 2022-09-16 NOTE — TOC Initial Note (Signed)
Transition of Care [facility]) - Initial/Assessment Note    Patient Details  Name: [name] [name] MRN: [id number] Date of Birth: May 13, 1956  Transition of Care [facility]) CM/SW Contact:    Elliot Gault, LCSW Phone Number: 09/16/2022, 1:34 PM  Clinical Narrative:                  Pt admitted from home. SDOH screening reviewed by this LCSW.  Pt has high readmission risk score. Assessment completed with pt at bedside. Pt alert and oriented x4. She states she lives alone in her apartment complex and she plans to return home at dc. Pt reports that she has an [employer] for O2 and other DME needs are met. Pt not active with HH. She states her daughter lives close by and assists when needed.  Pt states she can get to appointments and obtain medications as needed. Pt does not anticipate any new TOC needs for dc.  Reviewed SDOH screening results and pt denies any interpersonal violence risk at home. Offered resources though pt states she really does not need them.  TOC will follow and assist if needed.  Expected Discharge Plan: Home/Self Care Barriers to Discharge: Continued Medical Work up   Patient Goals and CMS Choice Patient states their goals for this hospitalization and ongoing recovery are:: return home          Expected Discharge Plan and Services In-house Referral: Clinical Social Work     Living arrangements for the past 2 months: Apartment                                      Prior Living Arrangements/Services Living arrangements for the past 2 months: Apartment Lives with:: Self Patient language and need for interpreter reviewed:: Yes Do you feel safe going back to the place where you live?: Yes      Need for Family Participation in Patient Care: No (Comment)   Current home services: DME Criminal Activity/Legal Involvement Pertinent to Current Situation/Hospitalization: No - Comment as needed  Activities of Daily Living Home Assistive Devices/Equipment:  None ADL Screening (condition at time of admission) Patient's cognitive ability adequate to safely complete daily activities?: Yes Is the patient deaf or have difficulty hearing?: No Does the patient have difficulty seeing, even when wearing glasses/contacts?: No Does the patient have difficulty concentrating, remembering, or making decisions?: No Patient able to express need for assistance with ADLs?: Yes Does the patient have difficulty dressing or bathing?: No Independently performs ADLs?: Yes (appropriate for developmental age) Does the patient have difficulty walking or climbing stairs?: No Weakness of Legs: Both Weakness of Arms/Hands: None  Permission Sought/Granted                  Emotional Assessment Appearance:: Appears stated age Attitude/Demeanor/Rapport: Engaged Affect (typically observed): Pleasant Orientation: : Oriented to Self, Oriented to Place, Oriented to  Time, Oriented to Situation Alcohol / Substance Use: Not Applicable Psych Involvement: No (comment)  Admission diagnosis:  Dehydration [E86.0] Hypermagnesemia [E83.41] Patient Active Problem List   Diagnosis Date Noted   Nicotine use disorder 09/15/2022   AKI (acute kidney injury) (HCC) 09/15/2022   Chronic anemia 09/15/2022   Mood disorder (HCC) 09/15/2022   Hypermagnesemia 09/14/2022   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) 06/10/2022   Community acquired bacterial pneumonia 06/10/2022   Chronic pain 06/05/2022   Atrial flutter (HCC) 06/04/2022   Urinary retention 06/04/2022  Acute hypoxic respiratory failure (HCC) 06/03/2022   Osteoarthritis of right hip 03/09/2022   Pain in joint of right hip 03/09/2022   Pain in joint of left knee 03/09/2022   Palliative care by specialist    Goals of care, counseling/discussion    DNR (do not resuscitate)    Arthritis of left knee 05/10/2017   COPD (chronic obstructive pulmonary disease) (HCC) 12/27/2012   Chronic respiratory failure  (HCC) 12/27/2012   Personal history of colonic polyps 12/11/2012   Chronic diarrhea 12/11/2012   Benign neoplasm of colon 12/11/2012   Alcohol abuse 12/04/2012   Hyperlipidemia 07/08/2012   COPD exacerbation (HCC) 04/13/2012   Low back pain 04/13/2012   Non-insulin dependent type 2 diabetes mellitus (HCC) 04/13/2012   Tobacco abuse 04/13/2012   Hypertension    Bipolar 1 disorder (HCC)    Insomnia    Depression    Suicidal thoughts    PCP:  [facility]:   [id number] - [place] - [address] [place] [address] Phone: [phone] Fax: [phone]  [id number] - [place], [place] - [address] AT [facility] [address] [place] [address] Phone: [phone] Fax: [phone]     Social Determinants of Health (SDOH) Social History: SDOH Screenings   Food Insecurity: No Food Insecurity (09/15/2022)  Housing: Low Risk  (09/15/2022)  Transportation Needs: No Transportation Needs (09/15/2022)  Utilities: Not At Risk (09/15/2022)  Tobacco Use: High Risk (09/14/2022)   SDOH Interventions:     Readmission Risk Interventions    09/16/2022    1:33 PM  Readmission Risk Prevention Plan  Transportation Screening Complete  HRI or Home Care Consult Complete  Social Work Consult for Recovery Care Planning/Counseling Complete  Palliative Care Screening Not Applicable  Medication Review Oceanographer) Complete

## 2022-09-16 NOTE — Progress Notes (Signed)
PROGRESS NOTE    [name] [name]  YNW:[id number] DOB: 1956/03/12 DOA: 09/14/2022 PCP: [facility]   Brief Narrative: [name] [name] is a 67 y.o. female with a history of chronic respiratory failure, COPD, atrial flutter/fibrillation, bipolar disorder, hypertension, hypothyroidism, hyperlipidemia, chronic pain. Patient presented secondary to tremors and anxiety and was found to have mild hypermagnesemia with associated AKI. IV fluids started. Patient also found to have some mild confusion of unclear etiology. During hospitalization, patient developed symptoms consistent with a COPD exacerbation.   Assessment and Plan:  Hypermagnesemia Mild. Magnesium of 3.6. Secondary to exogenous intake from supplement. Magnesium down to 2.9 today with IV fluids and Lasix. -Recheck magnesium in AM -NS IV fluids with Lasix x1  Confusion Unclear etiology. Per daughter, patient has not been her since since about 6 days prior to admission, but did not seek medical evaluation because they were out of town. Possible medication effect vs infection vs substance use vs possibly from mild hypermagnesemia. CT head was unremarkable on admission. ABG obtained this morning and shows mild alkalosis but mostly compensated chronic hypercapnia. Mental status improved today. -Avoid mental status altering medications  COPD with exacerbation Patient with mild wheezing. Episode of significant dyspnea soon after admission that responded to increased oxygen (6 L/min) and albuterol nebulizer treatment. Worsening productive sputum with associated chills more concerning for pneumonia. Chest x-ray negative and procalcitonin undetectable, indicating unlikely bacterial pneumonia. Patient received one dose of Ceftriaxone. -Start doxycycline -Prednisone daily with quick taper -Schedule Duonebs -Continue albuterol  Acute on chronic respiratory failure with hypoxia Patient requiring increase of oxygen to  supplementation to 6 L/min with associated dyspnea and SpO2 of 85% on 3 L/min of oxygen. Patient weaned back to baseline rate of 3 L/min after treatment of likely mild COPD exacerbation.  Atrial flutter/fibrillation -Continue diltiazem and Eliquis  Bipolar disorder Depression Anxiety -Continue home Zoloft, Seroquel, Depakote and BuSpar  Chronic anemia Stable.  AKI Creatinine of 1.53 on admission with associated BUN of 91.   Nicotine use -Continue nicotine patch  Hyperlipidemia -Continue Lipitor  Primary hypertension -Continue amlodipine, hydrochlorothiazide, and diltiazem  DVT prophylaxis: Eliquis Code Status:   Code Status: Full Code Family Communication: Daughter at bedside Disposition Plan: Discharge home likely tomorrow pending continued improvement and PT/OT recommendations as patient lives at home alone   Consultants:  None  Procedures:  None  Antimicrobials: Ceftriaxone Doxycycline   Subjective: Patient reports feeling better today. Productive cough. No chest pain or dyspnea.  Objective: BP (!) 140/66 (BP Location: Right Arm)   Pulse 69   Temp 98.8 F (37.1 C) (Oral)   Resp 20   Ht 5\' 5"  (1.651 m)   Wt 105 kg   SpO2 95%   BMI 38.52 kg/m   Examination:  General exam: Appears calm and comfortable Respiratory system: Wheezing. Respiratory effort normal. Cardiovascular system: S1 & S2 heard, RRR. No murmurs, rubs, gallops or clicks. Gastrointestinal system: Abdomen is nondistended, soft and nontender. Normal bowel sounds heard. Central nervous system: Alert and oriented. No focal neurological deficits. Musculoskeletal: No edema. No calf tenderness Skin: No cyanosis. No rashes Psychiatry: Judgement and insight appear normal. Mood & affect appropriate.   Data Reviewed: I have personally reviewed following labs and imaging studies  CBC Lab Results  Component Value Date   WBC 4.7 09/15/2022   RBC 3.41 (L) 09/15/2022   HGB 9.3 (L) 09/15/2022    HCT 31.1 (L) 09/15/2022   MCV 91.2 09/15/2022   MCH 27.3 09/15/2022   PLT 157  09/15/2022   MCHC 29.9 (L) 09/15/2022   RDW 14.6 09/15/2022   LYMPHSABS 1.0 09/15/2022   MONOABS 0.3 09/15/2022   EOSABS 0.2 09/15/2022   BASOSABS 0.1 09/15/2022     Last metabolic panel Lab Results  Component Value Date   NA 142 09/16/2022   K 4.3 09/16/2022   CL 98 09/16/2022   CO2 34 (H) 09/16/2022   BUN 63 (H) 09/16/2022   CREATININE 0.98 09/16/2022   GLUCOSE 158 (H) 09/16/2022   GFRNONAA >60 09/16/2022   GFRAA NOT CALCULATED 02/25/2020   CALCIUM 9.4 09/16/2022   PHOS 3.4 02/11/2020   PROT 7.2 09/15/2022   ALBUMIN 3.7 09/15/2022   BILITOT 0.6 09/15/2022   ALKPHOS 57 09/15/2022   AST 16 09/15/2022   ALT 15 09/15/2022   ANIONGAP 10 09/16/2022    GFR: Estimated Creatinine Clearance: 67.9 mL/min (by C-G formula based on SCr of 0.98 mg/dL).  No results found for this or any previous visit (from the past 240 hour(s)).    Radiology Studies: CT Head Wo Contrast  Result Date: 09/14/2022 CLINICAL DATA:  Mental status change, unknown cause EXAM: CT HEAD WITHOUT CONTRAST TECHNIQUE: Contiguous axial images were obtained from the base of the skull through the vertex without intravenous contrast. RADIATION DOSE REDUCTION: This exam was performed according to the departmental dose-optimization program which includes automated exposure control, adjustment of the mA and/or kV according to patient size and/or use of iterative reconstruction technique. COMPARISON:  08/29/2012 FINDINGS: Brain: No acute intracranial abnormality. Specifically, no hemorrhage, hydrocephalus, mass lesion, acute infarction, or significant intracranial injury. Vascular: No hyperdense vessel or unexpected calcification. Skull: No acute calvarial abnormality. Sinuses/Orbits: No acute findings Other: None IMPRESSION: Normal study. Electronically Signed   By: Charlett Nose M.D.   On: 09/14/2022 19:13   DG Chest Port 1 View  Result  Date: 09/14/2022 CLINICAL DATA:  Altered mental status.  Confusion. EXAM: PORTABLE CHEST 1 VIEW COMPARISON:  06/05/2022 FINDINGS: Cardiomegaly as seen previously. Chronic interstitial lung markings. There may be an element of pulmonary venous hypertension but there is no frank edema. No infiltrate, collapse or effusion. IMPRESSION: Cardiomegaly. Chronic interstitial lung markings. Possible pulmonary venous hypertension. No frank edema. Electronically Signed   By: Paulina Fusi M.D.   On: 09/14/2022 18:37      LOS: 1 day    Jacquelin Hawking, MD [facility] 09/16/2022, 7:36 AM   If 7PM-7AM, please contact night-coverage [email or web address]

## 2022-09-16 NOTE — Progress Notes (Signed)
1644 RN needed to do linen change and ask for neb to be done after. 1710 patient eating.

## 2022-09-17 LAB — BASIC METABOLIC PANEL
Anion gap: 9 (ref 5–15)
BUN: 54 mg/dL — ABNORMAL HIGH (ref 8–23)
CO2: 32 mmol/L (ref 22–32)
Calcium: 9.2 mg/dL (ref 8.9–10.3)
Chloride: 100 mmol/L (ref 98–111)
Creatinine, Ser: 0.85 mg/dL (ref 0.44–1.00)
GFR, Estimated: >60 mL/min (ref >60)
Glucose, Bld: 122 mg/dL — ABNORMAL HIGH (ref 70–99)
Potassium: 4.3 mmol/L (ref 3.5–5.1)
Sodium: 141 mmol/L (ref 135–145)

## 2022-09-17 LAB — MAGNESIUM: Magnesium: 2.5 mg/dL — ABNORMAL HIGH (ref 1.7–2.4)

## 2022-09-17 MED ORDER — DOXYCYCLINE HYCLATE 100 MG PO TABS: 100.0000 mg | ORAL_TABLET | Freq: Two times a day (BID) | ORAL | 0 refills | Status: AC

## 2022-09-17 MED ORDER — HYDROCODONE-ACETAMINOPHEN 10-325 MG PO TABS
1.0000 | ORAL_TABLET | Freq: Four times a day (QID) | ORAL | Status: DC | PRN
  Administered 2022-09-17: 1 via ORAL
  Filled 2022-09-17: qty 1

## 2022-09-17 MED ORDER — PREDNISONE 20 MG PO TABS: 40.0000 mg | ORAL_TABLET | Freq: Every day | ORAL | 0 refills | Status: AC

## 2022-09-17 MED ORDER — ALBUTEROL SULFATE (2.5 MG/3ML) 0.083% IN NEBU: 2.5000 mg | INHALATION_SOLUTION | RESPIRATORY_TRACT | Status: DC | PRN

## 2022-09-17 MED ORDER — IPRATROPIUM-ALBUTEROL 0.5-2.5 (3) MG/3ML IN SOLN
3.0000 mL | Freq: Four times a day (QID) | RESPIRATORY_TRACT | Status: DC
  Administered 2022-09-17: 3 mL via RESPIRATORY_TRACT
  Filled 2022-09-17: qty 3

## 2022-09-17 NOTE — TOC Progression Note (Signed)
Transition of Care [facility]) - Progression Note    Patient Details  Name: [name] [name] MRN: [id number] Date of Birth: 1956/03/08  [facility]) CM/SW Contact  Catalina Gravel, LCSW Phone Number: 09/17/2022, 1:59 PM  Clinical Narrative:     Pt clear for d/c.  HHPT recommended.  CSW contacted 5 agencies all declined pt and not able to take.  CSW contacted [facility] manager [facility]. Advised to follow up with pt and family as attempt made to coordinate.  Pt had a Dr. Georgina Peer Monday- she will discuss this with her DR. And also advised to call her insurance carrier about options.  Expected Discharge Plan: Home/Self Care Barriers to Discharge: No Barriers Identified  Expected Discharge Plan and Services In-house Referral: Clinical Social Work     Living arrangements for the past 2 months: Apartment Expected Discharge Date: 09/17/22                                     Social Determinants of Health (SDOH) Interventions SDOH Screenings   Food Insecurity: No Food Insecurity (09/15/2022)  Housing: Low Risk  (09/15/2022)  Transportation Needs: No Transportation Needs (09/15/2022)  Utilities: Not At Risk (09/15/2022)  Tobacco Use: High Risk (09/14/2022)    Readmission Risk Interventions    09/16/2022    1:33 PM  Readmission Risk Prevention Plan  Transportation Screening Complete  HRI or Home Care Consult Complete  Social Work Consult for Recovery Care Planning/Counseling Complete  Palliative Care Screening Not Applicable  Medication Review Oceanographer) Complete

## 2022-09-17 NOTE — Evaluation (Signed)
Physical Therapy Evaluation Patient Details Name: [name] [name] MRN: [id number] DOB: 26-Feb-1956 Today's Date: 09/17/2022  History of Present Illness  [name] [name] is a 67 y.o. female with medical history significant of chronic respiratory failure on 2 L nasal cannula at baseline, COPD, atrial fibrillation, bipolar 1 disorder, hypertension, hypothyroidism, hyperlipidemia, chronic pain, and more presents the ED with a chief complaint of tremors and anxiety.  Patient is a poor historian.  She reports she has had shaking, twitching, anxiety for a week.  Later she reports has been going on for a month.  She reports that she saw her PCP on the 18th and they increased her pain medication dosage for this problem.  She is not sure when she started Cogentin or what she started it for.  Patient reports that she and her PCP attributed her symptoms to worsening hip pain.  Patient denies any recent chest pain or palpitations.  She did have palpitations 2 and half months ago when she was diagnosed with atrial fibrillation per her report.  She denies any muscle cramping.  She does report that her knees and elbows have been buckling, and when she describes it it sounds a lot like generalized weakness.  Patient reports that she has had normal appetite, no change in her p.o. intake.  She had normal urine output.  She reports that she has been constipated but that she had a bowel movement this morning and she is not sure when before that.  She does not take laxatives at home.  Patient reports she has chronic right lower extremity asymmetrical edema, but there has been no change.  Patient has no other complaints at this time.   Clinical Impression  Patient limited for functional mobility as stated below secondary to BLE weakness and impaired standing balance. Patient does not require assist for bed mobility and demonstrates good sitting balance at EOB. She is given HHA to transfer to standing and for forward/retro gait  due to mild balance deficits. Educated on using RW upon return home for safety. Patient ends session seated in chair. Patient discharged to care of nursing for ambulation daily as tolerated for length of stay.         Recommendations for follow up therapy are one component of a multi-disciplinary discharge planning process, led by the attending physician.  Recommendations may be updated based on patient status, additional functional criteria and insurance authorization.  Follow Up Recommendations       Assistance Recommended at Discharge PRN  Patient can return home with the following  A lot of help with bathing/dressing/bathroom;Assistance with cooking/housework    Equipment Recommendations None recommended by PT  Recommendations for Other Services       Functional Status Assessment Patient has had a recent decline in their functional status and demonstrates the ability to make significant improvements in function in a reasonable and predictable amount of time.     Precautions / Restrictions Precautions Precautions: Fall Restrictions Weight Bearing Restrictions: No      Mobility  Bed Mobility Overal bed mobility: Independent                  Transfers Overall transfer level: Needs assistance Equipment used: 1 person hand held assist Transfers: Sit to/from Stand, Bed to chair/wheelchair/BSC Sit to Stand: Min guard   Step pivot transfers: Min guard       General transfer comment: mild unsteadiness    Ambulation/Gait Ambulation/Gait assistance: Min guard Gait Distance (Feet): 20 Feet Assistive device: 1 person  hand held assist Gait Pattern/deviations: Step-through pattern Gait velocity: forward and retro stepping in room     General Gait Details: mild unsteadiness  Stairs            Wheelchair Mobility    Modified Rankin (Stroke Patients Only)       Balance Overall balance assessment: Mild deficits observed, not formally tested                                            Pertinent Vitals/Pain Pain Assessment Pain Assessment: Faces Faces Pain Scale: Hurts a little bit Pain Location: R hip Pain Descriptors / Indicators: Sore Pain Intervention(s): Limited activity within patient's tolerance, Monitored during session, Repositioned    Home Living Family/patient expects to be discharged to:: Private residence Living Arrangements: Alone Available Help at Discharge: Family;Available PRN/intermittently Type of Home: Apartment Home Access: Level entry       Home Layout: One level Home Equipment: Rolling Walker (2 wheels);Grab bars - tub/shower;BSC/3in1;Cane - single point;Shower seat      Prior Function Prior Level of Function : Independent/Modified Independent             Mobility Comments: states household ambulation with AD PRN ADLs Comments: daughter assists with housework and does grocery shopping. Pt reports Mod I bathing and dressing as well as simple meal/snack prep     Hand Dominance        Extremity/Trunk Assessment   Upper Extremity Assessment Upper Extremity Assessment: Defer to OT evaluation    Lower Extremity Assessment Lower Extremity Assessment: Overall WFL for tasks assessed;Generalized weakness    Cervical / Trunk Assessment Cervical / Trunk Assessment: Normal  Communication   Communication: No difficulties  Cognition Arousal/Alertness: Awake/alert Behavior During Therapy: WFL for tasks assessed/performed Overall Cognitive Status: Within Functional Limits for tasks assessed                                          General Comments      Exercises     Assessment/Plan    PT Assessment All further PT needs can be met in the next venue of care  PT Problem List Decreased strength;Decreased activity tolerance;Decreased balance;Decreased mobility       PT Treatment Interventions      PT Goals (Current goals can be found in the Care Plan  section)  Acute Rehab PT Goals Patient Stated Goal: return home PT Goal Formulation: With patient Time For Goal Achievement: 09/17/22 Potential to Achieve Goals: Good    Frequency       Co-evaluation               AM-PAC PT "6 Clicks" Mobility  Outcome Measure Help needed turning from your back to your side while in a flat bed without using bedrails?: None Help needed moving from lying on your back to sitting on the side of a flat bed without using bedrails?: None Help needed moving to and from a bed to a chair (including a wheelchair)?: A Little Help needed standing up from a chair using your arms (e.g., wheelchair or bedside chair)?: A Little Help needed to walk in hospital room?: A Little Help needed climbing 3-5 steps with a railing? : A Little 6 Click Score: 20    End of Session  Activity Tolerance: Patient tolerated treatment well Patient left: in chair;with call bell/phone within reach Nurse Communication: Mobility status PT Visit Diagnosis: Unsteadiness on feet (R26.81);Other abnormalities of gait and mobility (R26.89);Muscle weakness (generalized) (M62.81)    Time: 6213-0865 PT Time Calculation (min) (ACUTE ONLY): 12 min   Charges:   PT Evaluation $PT Eval Low Complexity: 1 Low          11:22 AM, 09/17/22 Wyman Songster PT, DPT Physical Therapist at [facility]

## 2022-09-17 NOTE — Discharge Summary (Signed)
Physician Discharge Summary   Patient: [name] [name] MRN: [id number] DOB: 1955/11/30  Admit date:     09/14/2022  Discharge date: 09/17/22  Discharge Physician: Jacquelin Hawking, MD   PCP: [facility]   Recommendations at discharge:  PCP follow-up Stop magnesium supplementation Pulmonology referral for lung cancer screening Repeat BMP in 3-5 days  Discharge Diagnoses: Principal Problem:   Hypermagnesemia Active Problems:   Atrial flutter (HCC)   Hypertension   Hyperlipidemia   COPD (chronic obstructive pulmonary disease) (HCC)   Nicotine use disorder   AKI (acute kidney injury) (HCC)   Chronic anemia   Mood disorder (HCC)  Resolved Problems:   * No resolved hospital problems. *  Hospital Course: [name] [name] is a 67 y.o. female with a history of chronic respiratory failure, COPD, atrial flutter/fibrillation, bipolar disorder, hypertension, hypothyroidism, hyperlipidemia, chronic pain. Patient presented secondary to tremors and anxiety and was found to have mild hypermagnesemia with associated AKI. IV fluids started. Patient also found to have some mild confusion of unclear etiology. During hospitalization, patient developed symptoms consistent with a COPD exacerbation.  Assessment and Plan:  Hypermagnesemia Mild. Magnesium of 3.6. Secondary to exogenous intake from supplement. Magnesium down to 2.5 today with IV fluids and Lasix. Magnesium discontinued on discharge.   Confusion Unclear etiology. Per daughter, patient has not been her since since about 6 days prior to admission, but did not seek medical evaluation because they were out of town. Possible medication effect vs infection vs substance use vs possibly from mild hypermagnesemia. CT head was unremarkable on admission. ABG obtained this morning and shows mild alkalosis but mostly compensated chronic hypercapnia. Mental status back to baseline.  COPD with exacerbation Patient with mild wheezing.  Episode of significant dyspnea soon after admission that responded to increased oxygen (6 L/min) and albuterol nebulizer treatment. Worsening productive sputum with associated chills more concerning for pneumonia. Chest x-ray negative and procalcitonin undetectable, indicating unlikely bacterial pneumonia. Patient received one dose of Ceftriaxone and was started on doxycycline. Patient with improvement. Discharge on prednisone taper and doxycycline.   Acute on chronic respiratory failure with hypoxia Patient requiring increase of oxygen to supplementation to 6 L/min with associated dyspnea and SpO2 of 85% on 3 L/min of oxygen. Patient weaned back to baseline rate of 3 L/min after treatment of likely mild COPD exacerbation.   Atrial flutter/fibrillation Continue diltiazem and Eliquis.   Bipolar disorder Depression Anxiety Continue home Zoloft, Seroquel, Depakote and BuSpar.   Chronic anemia Stable.   AKI Creatinine of 1.53 on admission with associated BUN of 91. Resolved prior to discharge with IV fluids.   Nicotine use Continue nicotine patch.  Chronic systolic heart failure Stable.   Hyperlipidemia Continue Lipitor.   Primary hypertension Continue amlodipine, hydrochlorothiazide, and diltiazem.   Consultants: None Procedures performed: None  Disposition: Home health Diet recommendation: Cardiac diet   DISCHARGE MEDICATION: Allergies as of 09/17/2022       Reactions   Bactrim [sulfamethoxazole-trimethoprim] Anaphylaxis   Amoxicillin Hives   Diclofenac Tinitus   swelling in feet        Medication List     STOP taking these medications    Cartia XT 180 MG 24 hr capsule Generic drug: diltiazem   fluconazole 150 MG tablet Commonly known as: DIFLUCAN   magnesium oxide 400 MG tablet Commonly known as: MAG-OX       TAKE these medications    albuterol (5 MG/ML) 0.5% nebulizer solution Commonly known as: PROVENTIL Take 2.5 mg by nebulization every  6 (six)  hours as needed for wheezing or shortness of breath.   amLODipine 5 MG tablet Commonly known as: NORVASC Take 5 mg by mouth daily.   atorvastatin 20 MG tablet Commonly known as: LIPITOR Take 20 mg by mouth at bedtime.   benztropine 0.5 MG tablet Commonly known as: COGENTIN Take 1 tablet (0.5 mg total) by mouth 2 (two) times daily.   busPIRone 10 MG tablet Commonly known as: BUSPAR Take 10 mg by mouth 3 (three) times daily.   Combivent Respimat 20-100 MCG/ACT Aers respimat Generic drug: Ipratropium-Albuterol Inhale 2 puffs into the lungs every 6 (six) hours as needed for wheezing or shortness of breath.   divalproex 500 MG DR tablet Commonly known as: DEPAKOTE Take 500 mg by mouth 2 (two) times daily.   doxycycline 100 MG tablet Commonly known as: VIBRA-TABS Take 1 tablet (100 mg total) by mouth every 12 (twelve) hours for 4 days.   Eliquis 5 MG Tabs tablet Generic drug: apixaban Take 1 tablet (5 mg total) by mouth 2 (two) times daily.   furosemide 40 MG tablet Commonly known as: LASIX Take 1 tablet (40 mg total) by mouth daily.   HYDROcodone-acetaminophen 10-325 MG tablet Commonly known as: NORCO Take 1 tablet by mouth 4 (four) times daily as needed.   ketoconazole 2 % shampoo Commonly known as: NIZORAL Apply topically 2 (two) times a week.   lisinopril-hydrochlorothiazide 10-12.5 MG tablet Commonly known as: ZESTORETIC Take 1 tablet by mouth daily.   metoprolol tartrate 25 MG tablet Commonly known as: LOPRESSOR Take 1 tablet (25 mg total) by mouth 2 (two) times daily.   OXYGEN Inhale 2 L into the lungs daily.   predniSONE 20 MG tablet Commonly known as: DELTASONE Take 2 tablets (40 mg total) by mouth daily with breakfast for 3 days. Start taking on: September 18, 2022   QUEtiapine 50 MG tablet Commonly known as: SEROQUEL Take 50 mg by mouth See admin instructions. Take 1/2 tablet twice daily and 1 tablet at bedtime   sertraline 100 MG tablet Commonly  known as: ZOLOFT Take 200 mg by mouth daily.   Tiadylt ER 180 MG 24 hr capsule Generic drug: diltiazem Take 360 mg by mouth daily.   tiZANidine 4 MG tablet Commonly known as: ZANAFLEX Take 4 mg by mouth daily.   Trelegy Ellipta 100-62.5-25 MCG/ACT Aepb Generic drug: Fluticasone-Umeclidin-Vilant Take 1 Inhalation by mouth daily.   Vitamin D (Ergocalciferol) 1.25 MG (50000 UNIT) Caps capsule Commonly known as: DRISDOL Take 50,000 Units by mouth once a week. Mondays        Discharge Exam: BP 137/66 (BP Location: Left Arm)   Pulse 61   Temp 98.4 F (36.9 C) (Oral)   Resp 19   Ht 5\' 5"  (1.651 m)   Wt 105 kg   SpO2 98%   BMI 38.52 kg/m   General exam: Appears calm and comfortable Respiratory system: Clear to auscultation. Respiratory effort normal. Cardiovascular system: S1 & S2 heard, RRR. Systolic heart murmur Gastrointestinal system: Abdomen is nondistended, soft and nontender. No organomegaly or masses felt. Normal bowel sounds heard. Central nervous system: Alert and oriented. No focal neurological deficits. Musculoskeletal: No calf tenderness Skin: No cyanosis. No rashes Psychiatry: Judgement and insight appear normal. Mood & affect appropriate.   Condition at discharge: stable  The results of significant diagnostics from this hospitalization (including imaging, microbiology, ancillary and laboratory) are listed below for reference.   Imaging Studies: DG CHEST PORT 1 VIEW  Result Date: 09/16/2022  CLINICAL DATA:  Productive cough. EXAM: PORTABLE CHEST 1 VIEW COMPARISON:  September 14, 2022. FINDINGS: Stable cardiomegaly. Both lungs are clear. The visualized skeletal structures are unremarkable. IMPRESSION: No active disease. Electronically Signed   By: Lupita Raider M.D.   On: 09/16/2022 08:23   CT Head Wo Contrast  Result Date: 09/14/2022 CLINICAL DATA:  Mental status change, unknown cause EXAM: CT HEAD WITHOUT CONTRAST TECHNIQUE: Contiguous axial images were  obtained from the base of the skull through the vertex without intravenous contrast. RADIATION DOSE REDUCTION: This exam was performed according to the departmental dose-optimization program which includes automated exposure control, adjustment of the mA and/or kV according to patient size and/or use of iterative reconstruction technique. COMPARISON:  08/29/2012 FINDINGS: Brain: No acute intracranial abnormality. Specifically, no hemorrhage, hydrocephalus, mass lesion, acute infarction, or significant intracranial injury. Vascular: No hyperdense vessel or unexpected calcification. Skull: No acute calvarial abnormality. Sinuses/Orbits: No acute findings Other: None IMPRESSION: Normal study. Electronically Signed   By: Charlett Nose M.D.   On: 09/14/2022 19:13   DG Chest Port 1 View  Result Date: 09/14/2022 CLINICAL DATA:  Altered mental status.  Confusion. EXAM: PORTABLE CHEST 1 VIEW COMPARISON:  06/05/2022 FINDINGS: Cardiomegaly as seen previously. Chronic interstitial lung markings. There may be an element of pulmonary venous hypertension but there is no frank edema. No infiltrate, collapse or effusion. IMPRESSION: Cardiomegaly. Chronic interstitial lung markings. Possible pulmonary venous hypertension. No frank edema. Electronically Signed   By: Paulina Fusi M.D.   On: 09/14/2022 18:37    Microbiology: Results for orders placed or performed during the hospital encounter of 09/14/22  Urine Culture (for pregnant, neutropenic or urologic patients or patients with an indwelling urinary catheter)     Status: Abnormal   Collection Time: 09/14/22  6:27 PM   Specimen: Urine, Clean Catch  Result Value Ref Range Status   Specimen Description   Final    URINE, CLEAN CATCH Performed at [facility], [address], [place], [place] [address]    Special Requests   Final    NONE Performed at [facility], [address], [place], [place] [address]    Culture MULTIPLE SPECIES PRESENT, SUGGEST RECOLLECTION (A)   Final   Report Status 09/16/2022 FINAL  Final  Culture, blood (routine x 2) Call MD if unable to obtain prior to antibiotics being given     Status: None (Preliminary result)   Collection Time: 09/16/22  8:00 AM   Specimen: Left Antecubital; Blood  Result Value Ref Range Status   Specimen Description   Final    LEFT ANTECUBITAL BOTTLES DRAWN AEROBIC AND ANAEROBIC   Special Requests Blood Culture adequate volume  Final   Culture   Final    NO GROWTH < 24 HOURS Performed at [facility], [address] [place], [place] [address]    Report Status PENDING  Incomplete  Culture, blood (routine x 2) Call MD if unable to obtain prior to antibiotics being given     Status: None (Preliminary result)   Collection Time: 09/16/22  8:10 AM   Specimen: BLOOD RIGHT HAND  Result Value Ref Range Status   Specimen Description   Final    BLOOD RIGHT HAND BOTTLES DRAWN AEROBIC AND ANAEROBIC   Special Requests Blood Culture adequate volume  Final   Culture   Final    NO GROWTH < 24 HOURS Performed at [facility], [address] [place], [place] [address]    Report Status PENDING  Incomplete  Respiratory (~20 pathogens) panel by PCR     Status: None   Collection Time: 09/16/22  9:15 AM   Specimen: Nasopharyngeal Swab; Respiratory  Result Value Ref Range Status   Adenovirus NOT DETECTED NOT DETECTED Final   Coronavirus 229E NOT DETECTED NOT DETECTED Final    Comment: (NOTE) The Coronavirus on the Respiratory Panel, DOES NOT test for the novel  Coronavirus (2019 nCoV)    Coronavirus HKU1 NOT DETECTED NOT DETECTED Final   Coronavirus NL63 NOT DETECTED NOT DETECTED Final   Coronavirus OC43 NOT DETECTED NOT DETECTED Final   Metapneumovirus NOT DETECTED NOT DETECTED Final   Rhinovirus / Enterovirus NOT DETECTED NOT DETECTED Final   Influenza A NOT DETECTED NOT DETECTED Final   Influenza B NOT DETECTED NOT DETECTED Final   Parainfluenza Virus 1 NOT DETECTED NOT DETECTED Final   Parainfluenza Virus 2  NOT DETECTED NOT DETECTED Final   Parainfluenza Virus 3 NOT DETECTED NOT DETECTED Final   Parainfluenza Virus 4 NOT DETECTED NOT DETECTED Final   Respiratory Syncytial Virus NOT DETECTED NOT DETECTED Final   Bordetella pertussis NOT DETECTED NOT DETECTED Final   Bordetella Parapertussis NOT DETECTED NOT DETECTED Final   Chlamydophila pneumoniae NOT DETECTED NOT DETECTED Final   Mycoplasma pneumoniae NOT DETECTED NOT DETECTED Final    Comment: Performed at [facility], [address], [place], [place] [address]    Labs: CBC: Recent Labs  Lab 09/14/22 1842 09/15/22 0505  WBC 5.6 4.7  NEUTROABS 3.8 3.2  HGB 9.1* 9.3*  HCT 29.7* 31.1*  MCV 89.2 91.2  PLT 182 157   Basic Metabolic Panel: Recent Labs  Lab 09/14/22 1842 09/15/22 0505 09/16/22 0459 09/17/22 0528  NA 139 141 142 141  K 4.8 4.3 4.3 4.3  CL 94* 95* 98 100  CO2 34* 35* 34* 32  GLUCOSE 136* 122* 158* 122*  BUN 91* 76* 63* 54*  CREATININE 1.53* 1.13* 0.98 0.85  CALCIUM 9.4 9.1 9.4 9.2  MG 3.6* 3.1* 2.9* 2.5*   Liver Function Tests: Recent Labs  Lab 09/14/22 1842 09/15/22 0505  AST 19 16  ALT 17 15  ALKPHOS 60 57  BILITOT 0.6 0.6  PROT 7.5 7.2  ALBUMIN 3.9 3.7     Discharge time spent: 35 minutes.  Signed: Jacquelin Hawking, MD [facility] 09/17/2022

## 2022-09-17 NOTE — Progress Notes (Signed)
Patient discharged home today, transported home by family. Discharge paperwork went over with patient and daughter, both verbalized understanding. Belongings sent home with patient.  

## 2022-09-17 NOTE — Progress Notes (Signed)
Patient breathing medication delayed due to 0849 patient eating and with RN, and at  0920 and 0941 toileting .

## 2022-09-19 LAB — CULTURE, BLOOD (ROUTINE X 2)

## 2022-09-19 LAB — LEGIONELLA PNEUMOPHILA SEROGP 1 UR AG: L. pneumophila Serogp 1 Ur Ag: NEGATIVE

## 2022-09-20 LAB — CULTURE, BLOOD (ROUTINE X 2)
Culture: NO GROWTH
Culture: NO GROWTH
Special Requests: ADEQUATE

## 2022-09-21 LAB — CULTURE, BLOOD (ROUTINE X 2): Special Requests: ADEQUATE

## 2022-11-23 ENCOUNTER — Telehealth: Payer: Self-pay | Admitting: Cardiology

## 2022-11-23 NOTE — Telephone Encounter (Signed)
Patient c/o Palpitations:  High priority if patient c/o lightheadedness, shortness of breath, or chest pain  How long have you had palpitations/irregular HR/ Afib? Are you having the symptoms now?   No, comes and goes  Are you currently experiencing lightheadedness, SOB or CP?   Dizzy  Do you have a history of afib (atrial fibrillation) or irregular heart rhythm?   Yes  Have you checked your BP or HR? (document readings if available):  Not available  Are you experiencing any other symptoms?  Headache, tiredness, fatigue   Daughter is concerned regarding patient's afib symptoms and wants call back directly to patient to discuss next steps patient's mobile#  [phone]

## 2022-11-23 NOTE — Telephone Encounter (Signed)
I spoke with patient. She was seen 05/2022 in the hospital was to to f/u in February. She cancelled because she was exposed to covid,she was to call back. No appointment was made.Appointment made today for 12/13/22 with APP.  Patient tells me she has had one month duration of no appetite, weight loss, and fatigue. She says her dizziness is when she take her "psychiatric meds" on any empty stomach because she has no appetite. She said she was weighed today at a "cardiology" appointment and was 218 lbs-down from 238 lbs.She said she was not seen because she did not have her medications in a bag.She does not have any way to measure her BP or heart rate and when asked if she felt like her heart was racing, she said it feels like it is going slow. I encouraged her to see her pcp for loss of appetite, weight loss, and fatigued. She said I could call her daughter.   At the request of patient, I spoke with daughter,[name] [name]. She said they were at [facility] today and they were turned away by MD because patient did not have physical medications with her,they only had a list. Daughter is satisfied with our appointment, says mother will see pcp next week and they will address her issues with them.She says she was not aware her mother had or cancelled an appointment with [place] in February.

## 2022-11-29 ENCOUNTER — Encounter: Payer: Self-pay | Admitting: *Deleted

## 2022-11-29 NOTE — Progress Notes (Signed)
Cardiology Office Note:  .   Date:  12/13/2022  ID:  [name] [name], DOB 09-19-1955, MRN [id number] PCP: [facility], [facility]  [facility] Providers Cardiologist:  Dina Rich, MD    History of Present Illness: .   [name] [name] is a 67 y.o. female with a history of HTN, HLC, chronic systolic CHF, chronic respiratory failure, COPD, atrial flutter/fibrillation, bipolar disorder, hypertension, hypothyroidism, hyperlipidemia, chronic pain.  Patient had Aflutter with RVR in setting of COPD exacerbation and pneumonia. 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  Patient called in 11/23/22 with complaints of one month of no appetite, weight loss and fatigue. Dizzy when she takes psych meds on empty stomach. Went to [facility] cardiology and turned away b/c she didn't have meds with her.   She comes in with her daughter who's says she's having twitching and buckling like when her Magnesium was high. [facility] stopped her eliquis until she saw [place] as well as amlodipine. Has some heart flutter and some chest pressure when it's fluttering. It helps if she rubs her chest.Ran out of lasix last week and legs were swollen. Now better since back on lasix.  ROS:    Studies Reviewed: Marland Kitchen         Prior CV Studies: ECHO COMPLETE WO IMAGING ENHANCING AGENT 01/29/2020  Narrative ECHOCARDIOGRAM REPORT    Patient Name:   [name] [name] Date of Exam: 01/29/2020 Medical Rec #:  [id number]         Height:       65.0 in Accession #:    [id number]         Weight:       217.8 lb Date of Birth:  1956/04/11         BSA:          2.051 m Patient Age:    64 years          BP:           126/66 mmHg Patient Gender: F                 HR:           88 bpm. [facility]  Procedure: 2D Echo  Indications:    Cardiomegaly 429.3 / I51.7,  History:        Patient has no prior history of Echocardiogram examinations. COPD; Risk Factors:Current Smoker, Dyslipidemia, Hypertension and Diabetes. Severe Sepsis, ETOH, Bipolar, sepsis/ gm pos bacteremia.  Sonographer:    Jeryl Columbia RDCS (AE) Referring Phys: [id number] CARLOS MADERA  IMPRESSIONS   1. Left ventricular ejection fraction, by estimation, is 55 to 60%. The left ventricle has normal function. The left ventricle has no regional wall motion abnormalities. There is mild left ventricular hypertrophy. Left ventricular diastolic parameters are indeterminate. 2. Right ventricular systolic function is normal. The right ventricular size is normal. Tricuspid regurgitation signal is inadequate for assessing PA pressure. 3. Left atrial size was mildly dilated. 4. The mitral valve is abnormal, mildly calcified. Trivial mitral valve regurgitation. Moderate mitral annular calcification. 5.  Tricuspid valve is mildly calcified with trivial tricuspid regurgitation. 6. The aortic valve is tricuspid. Aortic valve regurgitation is not visualized. Mild to moderate aortic valve sclerosis/calcification is present, without any evidence of aortic stenosis. 7. The inferior vena cava is normal in size with <50% respiratory variability, suggesting right atrial pressure of 8 mmHg. 8. No definite valvular vegetations.  FINDINGS Left Ventricle: Left ventricular ejection fraction, by estimation, is 55 to 60%. The left ventricle has normal function. The left ventricle has no regional wall motion abnormalities. The left ventricular internal cavity size was normal in size. There is mild left ventricular hypertrophy.  Left ventricular diastolic parameters are indeterminate.  Right Ventricle: The right ventricular size is normal. No increase in right ventricular wall thickness. Right ventricular systolic function is normal. Tricuspid regurgitation signal is inadequate for assessing PA pressure.  Left Atrium: Left atrial size was mildly dilated.  Right Atrium: Right atrial size was normal in size.  Pericardium: There is no evidence of pericardial effusion.  Mitral Valve: The mitral valve is abnormal. There is mild thickening of the mitral valve leaflet(s). Moderate mitral annular calcification. Trivial mitral valve regurgitation.  Tricuspid Valve: Tricuspid valve is mildly calcified with trivial tricuspid regurgitation. The tricuspid valve is grossly normal. Tricuspid valve regurgitation is trivial.  Aortic Valve: The aortic valve is tricuspid. There is mild to moderate aortic valve annular calcification. Aortic valve regurgitation is not visualized. Mild to moderate aortic valve sclerosis/calcification is present, without any evidence of aortic stenosis.  Pulmonic Valve: The pulmonic valve was grossly normal. Pulmonic valve regurgitation is trivial.  Aorta: The aortic root is normal in size and structure.  Venous: The inferior vena cava is normal in size with less than 50% respiratory variability, suggesting right atrial pressure of 8 mmHg.  IAS/Shunts: No atrial level shunt detected by color flow Doppler.   LEFT VENTRICLE PLAX 2D LVIDd:         3.71 cm  Diastology LVIDs:         2.08 cm  LV e' medial:    4.87 cm/s LV PW:         1.28 cm  LV E/e' medial:  22.2 LV IVS:        1.23 cm  LV e' lateral:   4.95 cm/s LVOT diam:     2.00 cm  LV E/e' lateral: 21.8 LVOT Area:     3.14 cm   RIGHT VENTRICLE RV S prime:     11.70 cm/s TAPSE (M-mode): 2.7 cm  LEFT ATRIUM             Index       RIGHT ATRIUM           Index LA diam:        3.70 cm 1.80 cm/m  RA Area:     20.40 cm LA Vol (A2C):   72.8  ml 35.49 ml/m RA Volume:   66.90 ml  32.62 ml/m LA Vol (A4C):   76.6 ml 37.34 ml/m LA Biplane Vol: 76.9 ml 37.49 ml/m  AORTA Ao Root diam: 2.80 cm  MITRAL VALVE MV Area (PHT): 3.99 cm     SHUNTS MV Decel Time: 190 msec     Systemic Diam: 2.00 cm MR Peak grad: 83.2 mmHg MR Vmax:      456.00 cm/s MV E velocity: 108.00 cm/s MV A velocity: 124.00 cm/s MV E/A ratio:  0.87  Nona Dell MD Electronically signed by Nona Dell MD Signature Date/Time: 01/29/2020/10:08:48 AM  Final   ECHO LIMITED W IMAGE ENHANCING AGENT 06/05/2022  Narrative ECHOCARDIOGRAM REPORT    Patient Name:   [name] [name] Date of Exam: 06/05/2022 Medical Rec #:  [id number]         Height:       65.0 in Accession #:    [id number]        Weight:       244.0 lb Date of Birth:  August 13, 1955         BSA:          2.153 m Patient Age:    66 years          BP:           105/74 mmHg Patient Gender: F                 HR:           150 bpm. Exam Location:  Inpatient  Procedure: Limited Echo and Intracardiac Opacification Agent  Indications:    I48.92* Unspecified atrial flutter  History:        Patient has prior history of Echocardiogram examinations, most recent 01/29/2020. COPD, Arrythmias:Atrial Flutter; Risk Factors:Hypertension, Diabetes and Dyslipidemia.  Sonographer:    Irving Burton Senior RDCS Referring Phys: Alfredo Martinez   Sonographer Comments: Limited to assess for EF prior to cardioversion. IMPRESSIONS   1. Limited echo to assess LVEF. 2. Left ventricular ejection fraction, by estimation, is 45 to 50%. The left ventricle has mildly decreased function. The left ventricle demonstrates global hypokinesis. There is mild concentric left ventricular hypertrophy. 3. The mitral valve is degenerative. Severe mitral annular calcification. 4. Patient is in Afib with RVR during study  Comparison(s): Compared to prior study in 2021, the LVEF has dropped to 45-50% from 55-60% in the setting of Afib  with RVR.  FINDINGS Left Ventricle: Left ventricular ejection fraction, by estimation, is 45 to 50%. The left ventricle has mildly decreased function. The left ventricle demonstrates global hypokinesis. Definity contrast agent was given IV to delineate the left ventricular endocardial borders. The left ventricular internal cavity size was normal in size. There is mild concentric left ventricular hypertrophy.  Mitral Valve: The mitral valve is degenerative in appearance. There is moderate thickening of the mitral valve leaflet(s). There is moderate calcification of the mitral valve leaflet(s). Severe mitral annular calcification.  Laurance Flatten MD Electronically signed by Laurance Flatten MD Signature Date/Time: 06/05/2022/12:14:47 PM    Final       Risk Assessment/Calculations:    CHA2DS2-VASc Score = 4   This indicates a 4.8% annual risk of stroke. The patient's score is based upon: CHF History: 1 HTN History: 1 Diabetes History: 0 Stroke History: 0 Vascular Disease History: 0 Age Score: 1 Gender Score: 1            Physical Exam:   VS:  BP (!) 90/54   Pulse 66   Wt 212 lb (96.2 kg)   SpO2 91%   BMI 35.28 kg/m    Wt Readings from Last 3 Encounters:  12/13/22 212 lb (96.2 kg)  09/14/22 231 lb 7.7 oz (105 kg)  06/11/22 231 lb 7.7 oz (105 kg)    GEN: Well nourished, well developed in no acute distress NECK: No JVD; No carotid bruits CARDIAC:  RRR, no murmurs, rubs, gallops RESPIRATORY:  Clear to auscultation without rales, wheezing or rhonchi  ABDOMEN: Soft, non-tender, non-distended EXTREMITIES:  No edema; No deformity   ASSESSMENT AND PLAN: .    PAF/flutter-patient was  on eliquis but stopped by PCP for unclear reasons until she came here. She does have occasional flutters but regular on exam today. Will check CBC, CMET, MG and restart Eliquis. Hgb 9.3 in April and no labs since. Was supposed to be on iron but never got.  Chronic systolic CHF EF 45-50%  limied echo 06/10/22 felt tachy mediated.-swelling last week when out of lasix but stable today. Eats high salt diet. 2 gm sodium diet.   HTN-BP running low. Has lost over 20 lbs. Leave off  amlodipine. May need to reduce zestoretic. Continue metoprolol  HLD on atrovastatin managed by PCP  COPD with chronic resp failure  Twitching/jerking and loss of appetite and unintentional weight loss-suspect this could be due to psychiatric meds. I asked them to f/u with psych and PCP for further workup.        Dispo: f/u Dr. Wyline Mood 4-6 months.  Signed, Jacolyn Reedy, PA-C

## 2022-12-13 ENCOUNTER — Other Ambulatory Visit (HOSPITAL_COMMUNITY)
Admission: RE | Admit: 2022-12-13 | Discharge: 2022-12-13 | Disposition: A | Discharge status: Home / Self Care | Payer: 59 | Source: Ambulatory Visit | Attending: Physician Assistant | Admitting: Physician Assistant

## 2022-12-13 ENCOUNTER — Ambulatory Visit: Payer: 59 | Attending: Cardiology | Admitting: Physician Assistant

## 2022-12-13 ENCOUNTER — Encounter: Payer: Self-pay | Admitting: Physician Assistant

## 2022-12-13 VITALS — BP 90/54 | HR 66 | Wt 212.0 lb

## 2022-12-13 DIAGNOSIS — R253 Fasciculation: Secondary | ICD-10-CM

## 2022-12-13 DIAGNOSIS — E782 Mixed hyperlipidemia: Secondary | ICD-10-CM | POA: Diagnosis not present

## 2022-12-13 DIAGNOSIS — R634 Abnormal weight loss: Secondary | ICD-10-CM

## 2022-12-13 DIAGNOSIS — I480 Paroxysmal atrial fibrillation: Secondary | ICD-10-CM | POA: Diagnosis not present

## 2022-12-13 DIAGNOSIS — I5022 Chronic systolic (congestive) heart failure: Secondary | ICD-10-CM | POA: Insufficient documentation

## 2022-12-13 DIAGNOSIS — I10 Essential (primary) hypertension: Secondary | ICD-10-CM | POA: Diagnosis not present

## 2022-12-13 LAB — COMPREHENSIVE METABOLIC PANEL
ALT: 9 U/L (ref 0–44)
AST: 20 U/L (ref 15–41)
Albumin: 3.9 g/dL (ref 3.5–5.0)
Alkaline Phosphatase: 47 U/L (ref 38–126)
Anion gap: 7 (ref 5–15)
BUN: 52 mg/dL — ABNORMAL HIGH (ref 8–23)
CO2: 32 mmol/L (ref 22–32)
Calcium: 9 mg/dL (ref 8.9–10.3)
Chloride: 101 mmol/L (ref 98–111)
Creatinine, Ser: 1.24 mg/dL — ABNORMAL HIGH (ref 0.44–1.00)
GFR, Estimated: 48 mL/min — ABNORMAL LOW (ref >60)
Glucose, Bld: 100 mg/dL — ABNORMAL HIGH (ref 70–99)
Potassium: 4.4 mmol/L (ref 3.5–5.1)
Sodium: 140 mmol/L (ref 135–145)
Total Bilirubin: 0.7 mg/dL (ref 0.3–1.2)
Total Protein: 7.1 g/dL (ref 6.5–8.1)

## 2022-12-13 LAB — CBC
HCT: 34.1 % — ABNORMAL LOW (ref 36.0–46.0)
Hemoglobin: 10.6 g/dL — ABNORMAL LOW (ref 12.0–15.0)
MCH: 28 pg (ref 26.0–34.0)
MCHC: 31.1 g/dL (ref 30.0–36.0)
MCV: 90.2 fL (ref 80.0–100.0)
Platelets: 146 10*3/uL — ABNORMAL LOW (ref 150–400)
RBC: 3.78 MIL/uL — ABNORMAL LOW (ref 3.87–5.11)
RDW: 13.4 % (ref 11.5–15.5)
WBC: 6.3 10*3/uL (ref 4.0–10.5)
nRBC: 0 % (ref 0.0–0.2)

## 2022-12-13 LAB — MAGNESIUM: Magnesium: 2.2 mg/dL (ref 1.7–2.4)

## 2022-12-13 MED ORDER — FERROUS SULFATE 325 (65 FE) MG PO TABS: 325.0000 mg | ORAL_TABLET | Freq: Three times a day (TID) | ORAL | 3 refills | Status: DC

## 2022-12-13 MED ORDER — APIXABAN 5 MG PO TABS: 5.0000 mg | ORAL_TABLET | Freq: Two times a day (BID) | ORAL | 2 refills | Status: DC

## 2022-12-13 NOTE — Patient Instructions (Addendum)
Medication Instructions:  STOP Amlodipine   START Eliquis 5 mg twice a day   Take Iron (ferrous sulfate) 325 mg three times a day with food   Labwork: CBC,CMET,Magnesium- Today   Testing/Procedures: None today  Follow-Up: 4 months Dr.Branch  Any Other Special Instructions Will Be Listed Below (If Applicable).    Follow with primary care and psychiatry regarding arm twitching.  If you need a refill on your cardiac medications before your next appointment, please call your pharmacy.    Two Gram Sodium Diet 2000 mg  What is Sodium? Sodium is a mineral found naturally in many foods. The most significant source of sodium in the diet is table salt, which is about 40% sodium.  Processed, convenience, and preserved foods also contain a large amount of sodium.  The body needs only 500 mg of sodium daily to function,  A normal diet provides more than enough sodium even if you do not use salt.  Why Limit Sodium? A build up of sodium in the body can cause thirst, increased blood pressure, shortness of breath, and water retention.  Decreasing sodium in the diet can reduce edema and risk of heart attack or stroke associated with high blood pressure.  Keep in mind that there are many other factors involved in these health problems.  Heredity, obesity, lack of exercise, cigarette smoking, stress and what you eat all play a role.  General Guidelines: Do not add salt at the table or in cooking.  One teaspoon of salt contains over 2 grams of sodium. Read food labels Avoid processed and convenience foods Ask your dietitian before eating any foods not dicussed in the menu planning guidelines Consult your physician if you wish to use a salt substitute or a sodium containing medication such as antacids.  Limit milk and milk products to 16 oz (2 cups) per day.  Shopping Hints: READ LABELS!! "Dietetic" does not necessarily mean low sodium. Salt and other sodium ingredients are often added to foods  during processing.    Menu Planning Guidelines Food Group Choose More Often Avoid  Beverages (see also the milk group All fruit juices, low-sodium, salt-free vegetables juices, low-sodium carbonated beverages Regular vegetable or tomato juices, commercially softened water used for drinking or cooking  Breads and Cereals Enriched white, wheat, rye and pumpernickel bread, hard rolls and dinner rolls; muffins, cornbread and waffles; most dry cereals, cooked cereal without added salt; unsalted crackers and breadsticks; low sodium or homemade bread crumbs Bread, rolls and crackers with salted tops; quick breads; instant hot cereals; pancakes; commercial bread stuffing; self-rising flower and biscuit mixes; regular bread crumbs or cracker crumbs  Desserts and Sweets Desserts and sweets mad with mild should be within allowance Instant pudding mixes and cake mixes  Fats Butter or margarine; vegetable oils; unsalted salad dressings, regular salad dressings limited to 1 Tbs; light, sour and heavy cream Regular salad dressings containing bacon fat, bacon bits, and salt pork; snack dips made with instant soup mixes or processed cheese; salted nuts  Fruits Most fresh, frozen and canned fruits Fruits processed with salt or sodium-containing ingredient (some dried fruits are processed with sodium sulfites        Vegetables Fresh, frozen vegetables and low- sodium canned vegetables Regular canned vegetables, sauerkraut, pickled vegetables, and others prepared in brine; frozen vegetables in sauces; vegetables seasoned with ham, bacon or salt pork  Condiments, Sauces, Miscellaneous  Salt substitute with physician's approval; pepper, herbs, spices; vinegar, lemon or lime juice; hot pepper sauce; garlic  powder, onion powder, low sodium soy sauce (1 Tbs.); low sodium condiments (ketchup, chili sauce, mustard) in limited amounts (1 tsp.) fresh ground horseradish; unsalted tortilla chips, pretzels, potato chips,  popcorn, salsa (1/4 cup) Any seasoning made with salt including garlic salt, celery salt, onion salt, and seasoned salt; sea salt, rock salt, kosher salt; meat tenderizers; monosodium glutamate; mustard, regular soy sauce, barbecue, sauce, chili sauce, teriyaki sauce, steak sauce, Worcestershire sauce, and most flavored vinegars; canned gravy and mixes; regular condiments; salted snack foods, olives, picles, relish, horseradish sauce, catsup   Food preparation: Try these seasonings Meats:    Pork Sage, onion Serve with applesauce  Chicken Poultry seasoning, thyme, parsley Serve with cranberry sauce  Lamb Curry powder, rosemary, garlic, thyme Serve with mint sauce or jelly  Veal Marjoram, basil Serve with current jelly, cranberry sauce  Beef Pepper, bay leaf Serve with dry mustard, unsalted chive butter  Fish [place] leaf, dill Serve with unsalted lemon butter, unsalted parsley butter  Vegetables:    Asparagus Lemon juice   Broccoli Lemon juice   Carrots Mustard dressing parsley, mint, nutmeg, glazed with unsalted butter and sugar   Green beans Marjoram, lemon juice, nutmeg,dill seed   Tomatoes Basil, marjoram, onion   Spice /blend for [employer]" 4 tsp ground thyme 1 tsp ground sage 3 tsp ground rosemary 4 tsp ground marjoram   Test your knowledge A product that says "Salt Free" may still contain sodium. True or False Garlic Powder and Hot Pepper Sauce an be used as alternative seasonings.True or False Processed foods have more sodium than fresh foods.  True or False Canned Vegetables have less sodium than froze True or False   WAYS TO DECREASE YOUR SODIUM INTAKE Avoid the use of added salt in cooking and at the table.  Table salt (and other prepared seasonings which contain salt) is probably one of the greatest sources of sodium in the diet.  Unsalted foods can gain flavor from the sweet, sour, and butter taste sensations of herbs and spices.  Instead of using salt for seasoning, try the  following seasonings with the foods listed.  Remember: how you use them to enhance natural food flavors is limited only by your creativity... Allspice-Meat, fish, eggs, fruit, peas, red and yellow vegetables Almond Extract-Fruit baked goods Anise Seed-Sweet breads, fruit, carrots, beets, cottage cheese, cookies (tastes like licorice) Basil-Meat, fish, eggs, vegetables, rice, vegetables salads, soups, sauces Bay Leaf-Meat, fish, stews, poultry Burnet-Salad, vegetables (cucumber-like flavor) Caraway Seed-Bread, cookies, cottage cheese, meat, vegetables, cheese, rice Cardamon-Baked goods, fruit, soups Celery Powder or seed-Salads, salad dressings, sauces, meatloaf, soup, bread.Do not use  celery salt Chervil-Meats, salads, fish, eggs, vegetables, cottage cheese (parsley-like flavor) Chili Power-Meatloaf, chicken cheese, corn, eggplant, egg dishes Chives-Salads cottage cheese, egg dishes, soups, vegetables, sauces Cilantro-Salsa, casseroles Cinnamon-Baked goods, fruit, pork, lamb, chicken, carrots Cloves-Fruit, baked goods, fish, pot roast, green beans, beets, carrots Coriander-Pastry, cookies, meat, salads, cheese (lemon-orange flavor) Cumin-Meatloaf, fish,cheese, eggs, cabbage,fruit pie (caraway flavor) [employer], fruit, eggs, fish, poultry, cottage cheese, vegetables Dill Seed-Meat, cottage cheese, poultry, vegetables, fish, salads, bread Fennel Seed-Bread, cookies, apples, pork, eggs, fish, beets, cabbage, cheese, Licorice-like flavor Garlic-(buds or powder) Salads, meat, poultry, fish, bread, butter, vegetables, potatoes.Do not  use garlic salt Ginger-Fruit, vegetables, baked goods, meat, fish, poultry Horseradish Root-Meet, vegetables, butter Lemon Juice or Extract-Vegetables, fruit, tea, baked goods, fish salads Mace-Baked goods fruit, vegetables, fish, poultry (taste like nutmeg) Maple Extract-Syrups Marjoram-Meat, chicken, fish, vegetables, breads, green salads (taste like  Sage) Mint-Tea, lamb,  sherbet, vegetables, desserts, carrots, cabbage Mustard, Dry or Seed-Cheese, eggs, meats, vegetables, poultry Nutmeg-Baked goods, fruit, chicken, eggs, vegetables, desserts Onion Powder-Meat, fish, poultry, vegetables, cheese, eggs, bread, rice salads (Do not use   Onion salt) Orange Extract-Desserts, baked goods Oregano-Pasta, eggs, cheese, onions, pork, lamb, fish, chicken, vegetables, green salads Paprika-Meat, fish, poultry, eggs, cheese, vegetables Parsley Flakes-Butter, vegetables, meat fish, poultry, eggs, bread, salads (certain forms may   Contain sodium Pepper-Meat fish, poultry, vegetables, eggs Peppermint Extract-Desserts, baked goods Poppy Seed-Eggs, bread, cheese, fruit dressings, baked goods, noodles, vegetables, cottage  [employer], poultry, meat, fish, cauliflower, turnips,eggs bread Saffron-Rice, bread, veal, chicken, fish, eggs Sage-Meat, fish, poultry, onions, eggplant, tomateos, pork, stews Savory-Eggs, salads, poultry, meat, rice, vegetables, soups, pork Tarragon-Meat, poultry, fish, eggs, butter, vegetables (licorice-like flavor)  Thyme-Meat, poultry, fish, eggs, vegetables, (clover-like flavor), sauces, soups Tumeric-Salads, butter, eggs, fish, rice, vegetables (saffron-like flavor) Vanilla Extract-Baked goods, candy Vinegar-Salads, vegetables, meat marinades Walnut Extract-baked goods, candy   2. Choose your Foods Wisely   The following is a list of foods to avoid which are high in sodium:  Meats-Avoid all smoked, canned, salt cured, dried and kosher meat and fish as well as Anchovies   [employer] meats:Bologna, Liverwurst, Pastrami Canned meat or fish  Marinated herring Caviar    Pepperoni Corned Beef   Pizza Dried chipped beef  Salami Frozen breaded fish or meat Salt pork Frankfurters or hot dogs  Sardines Gefilte fish   Sausage Ham (boiled ham, Proscuitto Smoked butt     spiced ham)   Spam      TV Dinners Vegetables Canned vegetables (Regular) Relish Canned mushrooms  Sauerkraut Olives    Tomato juice Pickles  Bakery and Dessert Products Canned puddings  Cream pies Cheesecake   Decorated cakes Cookies  Beverages/Juices Tomato juice, regular  Gatorade   V-8 vegetable juice, regular  Breads and Cereals Biscuit mixes   Salted potato chips, corn chips, pretzels Bread stuffing mixes  Salted crackers and rolls Pancake and waffle mixes Self-rising flour  Seasonings Accent    Meat sauces Barbecue sauce  Meat tenderizer Catsup    Monosodium glutamate (MSG) Celery salt   Onion salt Chili sauce   Prepared mustard Garlic salt   Salt, seasoned salt, sea salt Gravy mixes   Soy sauce Horseradish   Steak sauce Ketchup   Tartar sauce Lite salt    Teriyaki sauce Marinade mixes   Worcestershire sauce  Others Baking powder   Cocoa and cocoa mixes Baking soda   Commercial casserole mixes Candy-caramels, chocolate  Dehydrated soups    Bars, fudge,nougats  Instant rice and pasta mixes Canned broth or soup  Maraschino cherries Cheese, aged and processed cheese and cheese spreads  Learning Assessment Quiz  Indicated T (for True) or F (for False) for each of the following statements:  _____ Fresh fruits and vegetables and unprocessed grains are generally low in sodium _____ Water may contain a considerable amount of sodium, depending on the source _____ You can always tell if a food is high in sodium by tasting it _____ Certain laxatives my be high in sodium and should be avoided unless prescribed   by a physician or pharmacist _____ Salt substitutes may be used freely by anyone on a sodium restricted diet _____ Sodium is present in table salt, food additives and as a natural component of   most foods _____ Table salt is approximately 90% sodium _____ Limiting sodium intake may help prevent excess fluid  accumulation in the body _____ On a  sodium-restricted diet, seasonings such as bouillon soy sauce, and    cooking wine should be used in place of table salt _____ On an ingredient list, a product which lists monosodium glutamate as the first   ingredient is an appropriate food to include on a low sodium diet  Circle the best answer(s) to the following statements (Hint: there may be more than one correct answer)  11. On a low-sodium diet, some acceptable snack items are:    A. Olives  F. Bean dip   K. Grapefruit juice    B. Salted Pretzels G. Commercial Popcorn   L. Canned peaches    C. Carrot Sticks  H. Bouillon   M. Unsalted nuts   D. [place] fries  I. Peanut butter crackers N. Salami   E. Sweet pickles J. Tomato Juice   O. Pizza  12.  Seasonings that may be used freely on a reduced - sodium diet include   A. Lemon wedges F.Monosodium glutamate K. Celery seed    B.Soysauce   G. Pepper   L. Mustard powder   C. Sea salt  H. Cooking wine  M. Onion flakes   D. Vinegar  E. Prepared horseradish N. Salsa   E. Sage   J. Worcestershire sauce  O. Chutney

## 2022-12-16 ENCOUNTER — Encounter: Payer: Self-pay | Admitting: *Deleted

## 2023-01-05 ENCOUNTER — Encounter: Payer: Self-pay | Admitting: *Deleted

## 2023-01-06 ENCOUNTER — Other Ambulatory Visit: Payer: Self-pay | Admitting: *Deleted

## 2023-01-06 DIAGNOSIS — K429 Umbilical hernia without obstruction or gangrene: Secondary | ICD-10-CM

## 2023-01-09 ENCOUNTER — Ambulatory Visit: Payer: 59 | Admitting: Cardiology

## 2023-01-10 ENCOUNTER — Encounter: Payer: Self-pay | Admitting: General Surgery

## 2023-01-10 ENCOUNTER — Other Ambulatory Visit: Payer: Self-pay

## 2023-01-10 ENCOUNTER — Ambulatory Visit (INDEPENDENT_AMBULATORY_CARE_PROVIDER_SITE_OTHER): Payer: 59 | Admitting: General Surgery

## 2023-01-10 VITALS — BP 99/65 | HR 61 | Temp 98.5°F | Resp 20 | Ht 65.0 in | Wt 210.0 lb

## 2023-01-10 DIAGNOSIS — K432 Incisional hernia without obstruction or gangrene: Secondary | ICD-10-CM

## 2023-01-10 NOTE — Progress Notes (Signed)
[name] [name]; [id number]; 1956/01/24   HPI Patient is a 67 year old white female who was referred to my care by Saundra Shelling, NP for evaluation and treatment of an umbilical hernia.  Patient states that she has noted increased swelling and pain around her umbilicus for several months now.  She also notices swelling along the upper abdomen when sitting up.  She denies any nausea or vomiting.  Her lifestyle is limited due to a history of congestive heart failure, COPD on home O2, atrial fibrillation with Eliquis use.  The swelling is made worse with straining or sitting up. Past Medical History:  Diagnosis Date   Acute hypoxic respiratory failure (HCC) 06/03/2022   Acute respiratory failure (HCC) 04/13/2012   Acute respiratory failure with hypoxia and hypercapnia (HCC) 07/08/2012   Anxiety    Atrial fibrillation with RVR (HCC) 02/05/2020   Atrial flutter (HCC) 06/04/2022   Bipolar 1 disorder (HCC)    Cancer (HCC)    colon   Cellulitis of right lower leg    Chronic diarrhea    Followed by Dr Bosie Clos    Chronic pain 06/05/2022   Community acquired bacterial pneumonia 06/10/2022   Complication of anesthesia    Hx resp distress during surgery   COPD (chronic obstructive pulmonary disease) (HCC)    COPD exacerbation (HCC) 04/13/2012   Depression    Diabetes mellitus (HCC)    External hemorrhoids    Gangrene of toe of right foot (HCC)    GI bleed    Headache(784.0)    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) 06/10/2022   Hyperlipidemia    Hypertension    Hypothyroidism    Insomnia    Internal hemorrhoids    Necrotizing fasciitis of lower leg (HCC)    Non-insulin dependent type 2 diabetes mellitus (HCC) 04/13/2012   Polysubstance abuse (HCC) Quit 2008    H/O methamphetamine and alcohol abuse, clean x 6 years   Sepsis (HCC) 01/26/2020   Shortness of breath    Suicidal thoughts    Tobacco abuse    Tubular adenoma of colon    Urinary retention 06/04/2022     Past Surgical History:  Procedure Laterality Date   ABDOMINAL HYSTERECTOMY     APPENDECTOMY     BUBBLE STUDY  06/10/2022   Procedure: BUBBLE STUDY;  Surgeon: Parke Poisson, MD;  Location: [facility] ENDOSCOPY;  Service: Cardiovascular;;   CARDIOVERSION N/A 06/10/2022   Procedure: CARDIOVERSION;  Surgeon: Parke Poisson, MD;  Location: [facility] ENDOSCOPY;  Service: Cardiovascular;  Laterality: N/A;   COLON SURGERY     "colon repair"   COLONOSCOPY WITH PROPOFOL N/A 12/11/2012   Procedure: COLONOSCOPY WITH PROPOFOL;  Surgeon: Beverley Fiedler, MD;  Location: [facility] ENDOSCOPY;  Service: Gastroenterology;  Laterality: N/A;   FINGER SURGERY     middle left finger   HERNIA REPAIR     x 2   KNEE SURGERY Left    Left knee tendon surgery   TEE WITHOUT CARDIOVERSION N/A 06/10/2022   Procedure: TRANSESOPHAGEAL ECHOCARDIOGRAM (TEE);  Surgeon: Parke Poisson, MD;  Location: [facility] ENDOSCOPY;  Service: Cardiovascular;  Laterality: N/A;   TONSILLECTOMY     TUBAL LIGATION     WOUND DEBRIDEMENT Right 02/06/2020   Procedure: SKIN BIOPSY WITH DEBRIDEMENT RIGHT LOWER LEG;  Surgeon: Lucretia Roers, MD;  Location: [facility] ORS;  Service: General;  Laterality: Right;    Family History  Problem Relation Age of Onset   Multiple sclerosis Mother    Alcoholism Father  Depression Brother    Emphysema Maternal Grandfather        smoked   Breast cancer Neg Hx    Colon cancer Neg Hx    Esophageal cancer Neg Hx    Colon polyps Neg Hx    Stomach cancer Neg Hx    Pancreatic cancer Neg Hx    Liver disease Neg Hx     Current Outpatient Medications on File Prior to Visit  Medication Sig Dispense Refill   albuterol (PROVENTIL) (5 MG/ML) 0.5% nebulizer solution Take 2.5 mg by nebulization every 6 (six) hours as needed for wheezing or shortness of breath.     albuterol (VENTOLIN HFA) 108 (90 Base) MCG/ACT inhaler Inhale 1-2 puffs into the lungs every 4 (four) hours as needed for shortness of breath.     apixaban (ELIQUIS)  5 MG TABS tablet Take 1 tablet (5 mg total) by mouth 2 (two) times daily. 60 tablet 2   atorvastatin (LIPITOR) 20 MG tablet Take 20 mg by mouth at bedtime.     benztropine (COGENTIN) 0.5 MG tablet Take 1 tablet (0.5 mg total) by mouth 2 (two) times daily. 60 tablet 0   busPIRone (BUSPAR) 10 MG tablet Take 10 mg by mouth 3 (three) times daily.     COMBIVENT RESPIMAT 20-100 MCG/ACT AERS respimat Inhale 2 puffs into the lungs every 6 (six) hours as needed for wheezing or shortness of breath.     divalproex (DEPAKOTE) 500 MG DR tablet Take 500 mg by mouth 2 (two) times daily.     ferrous sulfate 325 (65 FE) MG tablet Take 1 tablet (325 mg total) by mouth 3 (three) times daily with meals. 90 tablet 3   Fluticasone-Umeclidin-Vilant (TRELEGY ELLIPTA) 100-62.5-25 MCG/ACT AEPB Take 1 Inhalation by mouth daily. 60 each 0   furosemide (LASIX) 40 MG tablet Take 1 tablet (40 mg total) by mouth daily. 30 tablet 0   HYDROcodone-acetaminophen (NORCO) 10-325 MG tablet Take 1 tablet by mouth 4 (four) times daily as needed.     ketoconazole (NIZORAL) 2 % shampoo Apply topically 2 (two) times a week.     lisinopril-hydrochlorothiazide (ZESTORETIC) 10-12.5 MG tablet Take 1 tablet by mouth daily.     metoprolol tartrate (LOPRESSOR) 25 MG tablet Take 1 tablet (25 mg total) by mouth 2 (two) times daily. 60 tablet 0   OXYGEN Inhale 2 L into the lungs daily.     QUEtiapine (SEROQUEL) 50 MG tablet Take 50 mg by mouth See admin instructions. Take 1/2 tablet twice daily and 1 tablet at bedtime     sertraline (ZOLOFT) 100 MG tablet Take 200 mg by mouth daily.      TIADYLT ER 180 MG 24 hr capsule Take 360 mg by mouth daily.     tiZANidine (ZANAFLEX) 4 MG tablet Take 4 mg by mouth daily.     Vitamin D, Ergocalciferol, (DRISDOL) 1.25 MG (50000 UNIT) CAPS capsule Take 50,000 Units by mouth once a week. Mondays     No current facility-administered medications on file prior to visit.    Allergies  Allergen Reactions   Bactrim  [Sulfamethoxazole-Trimethoprim] Anaphylaxis   Penicillins Shortness Of Breath   Amoxicillin Hives   Diclofenac Tinitus and Hives    swelling in feet  *Analgesics - Anti-Inflammatory*    Social History   Substance and Sexual Activity  Alcohol Use Yes   Comment: occ    Social History   Tobacco Use  Smoking Status Former   Current packs/day: 1.00   Average  packs/day: 1 pack/day for 42.0 years (42.0 ttl pk-yrs)   Types: Cigarettes  Smokeless Tobacco Former   Quit date: 2021    Review of Systems  Constitutional:  Positive for malaise/fatigue.  HENT: Negative.    Eyes: Negative.   Respiratory: Negative.    Cardiovascular: Negative.   Gastrointestinal:  Positive for abdominal pain, heartburn and nausea.  Genitourinary: Negative.   Musculoskeletal:  Positive for joint pain.  Skin: Negative.   Neurological:  Positive for dizziness.  Endo/Heme/Allergies:  Bruises/bleeds easily.  Psychiatric/Behavioral: Negative.      Objective   Vitals:   01/10/23 0928  BP: 99/65  Pulse: 61  Resp: 20  Temp: 98.5 F (36.9 C)  SpO2: 91%    Physical Exam Vitals reviewed.  Constitutional:      Appearance: Normal appearance. She is obese. She is not ill-appearing.  HENT:     Head: Normocephalic and atraumatic.  Cardiovascular:     Rate and Rhythm: Normal rate. Rhythm irregular.     Heart sounds: Normal heart sounds. No murmur heard.    No friction rub. No gallop.  Pulmonary:     Effort: Pulmonary effort is normal. No respiratory distress.     Breath sounds: Normal breath sounds. No stridor. No wheezing, rhonchi or rales.     Comments: On 2 L nasal cannula Abdominal:     General: Bowel sounds are normal. There is no distension.     Palpations: Abdomen is soft. There is no mass.     Tenderness: There is abdominal tenderness. There is no guarding or rebound.     Hernia: A hernia is present.     Comments: Patient has a large 5 to 6 cm incisional hernia just below the umbilicus  which extends to the left of the abdomen.  It is easily reducible.  She also has a large diastases recti.  Skin:    General: Skin is warm and dry.  Neurological:     Mental Status: She is alert and oriented to person, place, and time.     Assessment  Incisional hernia Diastases recti COPD on home O2, Eliquis for atrial fibrillation, history of congestive heart failure with ejection fraction of 45 to 50% Plan  I told the patient that I do not recommend surgical treatment for her hernia due to her multiple comorbidities.  She is a high risk surgical candidate for general anesthesia.  The hernia is large enough that it should not cause incarceration.  She was given instructions on how to reduce the hernia.  I also explained to her that she has a diastases recti and that is a benign condition.  She understands and agrees.  She does not want surgery unless absolutely necessary.  Follow-up here as needed.

## 2023-04-24 ENCOUNTER — Encounter: Payer: Self-pay | Admitting: Cardiology

## 2023-04-24 ENCOUNTER — Ambulatory Visit: Payer: 59 | Attending: Cardiology | Admitting: Cardiology

## 2023-04-24 VITALS — BP 96/48 | HR 66 | Ht 60.0 in | Wt 196.0 lb

## 2023-04-24 DIAGNOSIS — I4892 Unspecified atrial flutter: Secondary | ICD-10-CM

## 2023-04-24 DIAGNOSIS — R42 Dizziness and giddiness: Secondary | ICD-10-CM | POA: Diagnosis not present

## 2023-04-24 DIAGNOSIS — I5022 Chronic systolic (congestive) heart failure: Secondary | ICD-10-CM | POA: Diagnosis not present

## 2023-04-24 NOTE — Patient Instructions (Signed)
Medication Instructions:  Your physician has recommended you make the following change in your medication:   -Stop Lisinopril-HCTZ  *If you need a refill on your cardiac medications before your next appointment, please call your pharmacy*   Lab Work: None If you have labs (blood work) drawn today and your tests are completely normal, you will receive your results only by: MyChart Message (if you have MyChart) OR A paper copy in the mail If you have any lab test that is abnormal or we need to change your treatment, we will call you to review the results.   Testing/Procedures: Your physician has requested that you have an echocardiogram. Echocardiography is a painless test that uses sound waves to create images of your heart. It provides your doctor with information about the size and shape of your heart and how well your heart's chambers and valves are working. This procedure takes approximately one hour. There are no restrictions for this procedure. Please do NOT wear cologne, perfume, aftershave, or lotions (deodorant is allowed). Please arrive 15 minutes prior to your appointment time.  Please note: We ask at that you not bring children with you during ultrasound (echo/ vascular) testing. Due to room size and safety concerns, children are not allowed in the ultrasound rooms during exams. Our front office staff cannot provide observation of children in our lobby area while testing is being conducted. An adult accompanying a patient to their appointment will only be allowed in the ultrasound room at the discretion of the ultrasound technician under special circumstances. We apologize for any inconvenience.    Follow-Up: At [facility], you and your health needs are our priority.  As part of our continuing mission to provide you with exceptional heart care, we have created designated Provider Care Teams.  These Care Teams include your primary Cardiologist (physician) and Advanced  Practice Providers (APPs -  Physician Assistants and Nurse Practitioners) who all work together to provide you with the care you need, when you need it.  We recommend signing up for the patient portal called "MyChart".  Sign up information is provided on this After Visit Summary.  MyChart is used to connect with patients for Virtual Visits (Telemedicine).  Patients are able to view lab/test results, encounter notes, upcoming appointments, etc.  Non-urgent messages can be sent to your provider as well.   To learn more about what you can do with MyChart, go to [email or web address].    Your next appointment:   3 month(s)  Provider:   You will see one of the following Advanced Practice Providers on your designated Care Team:   Randall An, PA-C  Jacolyn Reedy, [place]       Other Instructions BP Check Dec. 10.

## 2023-04-24 NOTE — Progress Notes (Signed)
Clinical Summary Ms. Lawerance Bach is a 67 y.o.female seen today for follow up of the following medical problems.   1.Aflutter - admit Jan 2024 with COPD and pneumonia, found to be in aflutter with RVR - Jan 2024 inpatient EP eval: "flecainide poor option with LV dysfunction; Would avoid multaq and sotalol as well.  Amiodarone is a poor option with severe COPD Tikosyn not a great option given that she is on multple QT prolonging agents which would need to be addressed (HCTz, Breo Ellipta, Seroquel, and Sertraline)"   06/10/22 underwent TEE/DCCV - some dizziness and heart racing with standing - no bleeding on eliquis.    2. Dizziness - occurs with standing - drinks no water. Drinks orange juice 4-6 8 oz cups per day. Not much else - down 30 lbs since 08/2022, has not had much appetite - SBP in 90s last few provider visits from epic review.     3. HFmrEF - Jan 2024 LVEF 45-50% (in aflutter at the time, thought to be tachy mediated) - no recent edema.  - no SOb/DOE   4. HTN - compliant with meds   5. HLD   6. COPD Past Medical History:  Diagnosis Date   Acute hypoxic respiratory failure (HCC) 06/03/2022   Acute respiratory failure (HCC) 04/13/2012   Acute respiratory failure with hypoxia and hypercapnia (HCC) 07/08/2012   Anxiety    Atrial fibrillation with RVR (HCC) 02/05/2020   Atrial flutter (HCC) 06/04/2022   Bipolar 1 disorder (HCC)    Cancer (HCC)    colon   Cellulitis of right lower leg    Chronic diarrhea    Followed by Dr Bosie Clos    Chronic pain 06/05/2022   Community acquired bacterial pneumonia 06/10/2022   Complication of anesthesia    Hx resp distress during surgery   COPD (chronic obstructive pulmonary disease) (HCC)    COPD exacerbation (HCC) 04/13/2012   Depression    Diabetes mellitus (HCC)    External hemorrhoids    Gangrene of toe of right foot (HCC)    GI bleed    Headache(784.0)    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)  06/10/2022   Hyperlipidemia    Hypertension    Hypothyroidism    Insomnia    Internal hemorrhoids    Necrotizing fasciitis of lower leg (HCC)    Non-insulin dependent type 2 diabetes mellitus (HCC) 04/13/2012   Polysubstance abuse (HCC) Quit 2008    H/O methamphetamine and alcohol abuse, clean x 6 years   Sepsis (HCC) 01/26/2020   Shortness of breath    Suicidal thoughts    Tobacco abuse    Tubular adenoma of colon    Urinary retention 06/04/2022     Allergies  Allergen Reactions   Bactrim [Sulfamethoxazole-Trimethoprim] Anaphylaxis   Penicillins Shortness Of Breath   Amoxicillin Hives   Diclofenac Tinitus and Hives    swelling in feet  *Analgesics - Anti-Inflammatory*     Current Outpatient Medications  Medication Sig Dispense Refill   albuterol (PROVENTIL) (5 MG/ML) 0.5% nebulizer solution Take 2.5 mg by nebulization every 6 (six) hours as needed for wheezing or shortness of breath.     albuterol (VENTOLIN HFA) 108 (90 Base) MCG/ACT inhaler Inhale 1-2 puffs into the lungs every 4 (four) hours as needed for shortness of breath.     apixaban (ELIQUIS) 5 MG TABS tablet Take 1 tablet (5 mg total) by mouth 2 (two) times daily. 60 tablet 2   atorvastatin (LIPITOR)  20 MG tablet Take 20 mg by mouth at bedtime.     benztropine (COGENTIN) 0.5 MG tablet Take 1 tablet (0.5 mg total) by mouth 2 (two) times daily. 60 tablet 0   busPIRone (BUSPAR) 10 MG tablet Take 10 mg by mouth 3 (three) times daily.     COMBIVENT RESPIMAT 20-100 MCG/ACT AERS respimat Inhale 2 puffs into the lungs every 6 (six) hours as needed for wheezing or shortness of breath.     divalproex (DEPAKOTE) 500 MG DR tablet Take 500 mg by mouth 2 (two) times daily.     ferrous sulfate 325 (65 FE) MG tablet Take 1 tablet (325 mg total) by mouth 3 (three) times daily with meals. 90 tablet 3   Fluticasone-Umeclidin-Vilant (TRELEGY ELLIPTA) 100-62.5-25 MCG/ACT AEPB Take 1 Inhalation by mouth daily. 60 each 0   furosemide  (LASIX) 40 MG tablet Take 1 tablet (40 mg total) by mouth daily. 30 tablet 0   HYDROcodone-acetaminophen (NORCO) 10-325 MG tablet Take 1 tablet by mouth 4 (four) times daily as needed.     ketoconazole (NIZORAL) 2 % shampoo Apply topically 2 (two) times a week.     lisinopril-hydrochlorothiazide (ZESTORETIC) 10-12.5 MG tablet Take 1 tablet by mouth daily.     metoprolol tartrate (LOPRESSOR) 25 MG tablet Take 1 tablet (25 mg total) by mouth 2 (two) times daily. 60 tablet 0   OXYGEN Inhale 2 L into the lungs daily.     QUEtiapine (SEROQUEL) 50 MG tablet Take 50 mg by mouth See admin instructions. Take 1/2 tablet twice daily and 1 tablet at bedtime     sertraline (ZOLOFT) 100 MG tablet Take 200 mg by mouth daily.      TIADYLT ER 180 MG 24 hr capsule Take 360 mg by mouth daily.     tiZANidine (ZANAFLEX) 4 MG tablet Take 4 mg by mouth daily.     Vitamin D, Ergocalciferol, (DRISDOL) 1.25 MG (50000 UNIT) CAPS capsule Take 50,000 Units by mouth once a week. Mondays     No current facility-administered medications for this visit.     Past Surgical History:  Procedure Laterality Date   ABDOMINAL HYSTERECTOMY     APPENDECTOMY     BUBBLE STUDY  06/10/2022   Procedure: BUBBLE STUDY;  Surgeon: Parke Poisson, MD;  Location: [facility] ENDOSCOPY;  Service: Cardiovascular;;   CARDIOVERSION N/A 06/10/2022   Procedure: CARDIOVERSION;  Surgeon: Parke Poisson, MD;  Location: [facility] ENDOSCOPY;  Service: Cardiovascular;  Laterality: N/A;   COLON SURGERY     "colon repair"   COLONOSCOPY WITH PROPOFOL N/A 12/11/2012   Procedure: COLONOSCOPY WITH PROPOFOL;  Surgeon: Beverley Fiedler, MD;  Location: [facility] ENDOSCOPY;  Service: Gastroenterology;  Laterality: N/A;   FINGER SURGERY     middle left finger   HERNIA REPAIR     x 2   KNEE SURGERY Left    Left knee tendon surgery   TEE WITHOUT CARDIOVERSION N/A 06/10/2022   Procedure: TRANSESOPHAGEAL ECHOCARDIOGRAM (TEE);  Surgeon: Parke Poisson, MD;  Location: [facility] ENDOSCOPY;   Service: Cardiovascular;  Laterality: N/A;   TONSILLECTOMY     TUBAL LIGATION     WOUND DEBRIDEMENT Right 02/06/2020   Procedure: SKIN BIOPSY WITH DEBRIDEMENT RIGHT LOWER LEG;  Surgeon: Lucretia Roers, MD;  Location: [facility] ORS;  Service: General;  Laterality: Right;     Allergies  Allergen Reactions   Bactrim [Sulfamethoxazole-Trimethoprim] Anaphylaxis   Penicillins Shortness Of Breath   Amoxicillin Hives   Diclofenac Tinitus and Hives  swelling in feet  *Analgesics - Anti-Inflammatory*      Family History  Problem Relation Age of Onset   Multiple sclerosis Mother    Alcoholism Father    Depression Brother    Emphysema Maternal Grandfather        smoked   Breast cancer Neg Hx    Colon cancer Neg Hx    Esophageal cancer Neg Hx    Colon polyps Neg Hx    Stomach cancer Neg Hx    Pancreatic cancer Neg Hx    Liver disease Neg Hx      Social History Ms. Eiland reports that she has quit smoking. Her smoking use included cigarettes. She has a 42 pack-year smoking history. She quit smokeless tobacco use about 3 years ago. Ms. Lawerance Bach reports current alcohol use.   Review of Systems CONSTITUTIONAL: No weight loss, fever, chills, weakness or fatigue.  HEENT: Eyes: No visual loss, blurred vision, double vision or yellow sclerae.No hearing loss, sneezing, congestion, runny nose or sore throat.  SKIN: No rash or itching.  CARDIOVASCULAR: per hpi RESPIRATORY: No shortness of breath, cough or sputum.  GASTROINTESTINAL: No anorexia, nausea, vomiting or diarrhea. No abdominal pain or blood.  GENITOURINARY: No burning on urination, no polyuria NEUROLOGICAL: No headache, dizziness, syncope, paralysis, ataxia, numbness or tingling in the extremities. No change in bowel or bladder control.  MUSCULOSKELETAL: No muscle, back pain, joint pain or stiffness.  LYMPHATICS: No enlarged nodes. No history of splenectomy.  PSYCHIATRIC: No history of depression or anxiety.  ENDOCRINOLOGIC:  No reports of sweating, cold or heat intolerance. No polyuria or polydipsia.  Marland Kitchen   Physical Examination Today's Vitals   04/24/23 1124  BP: (!) 96/48  Pulse: 66  SpO2: 93%  Weight: 196 lb (88.9 kg)  Height: 5' (1.524 m)   Body mass index is 38.28 kg/m.  Gen: resting comfortably, no acute distress HEENT: no scleral icterus, pupils equal round and reactive, no palptable cervical adenopathy,  CV: RRR, no m/rg, no jvd Resp: Clear to auscultation bilaterally GI: abdomen is soft, non-tender, non-distended, normal bowel sounds, no hepatosplenomegaly MSK: extremities are warm, no edema.  Skin: warm, no rash Neuro:  no focal deficits Psych: appropriate affect   Diagnostic Studies     Assessment and Plan   1.Aflutter - has done well since DCCV in January - some feeling of heart racing with standing I think more related to orthostatic symptosm as opposed to arrhythmia - continue current meds including eliquis for stroke prevention - EKG today shows  2. Orthostatic dizziness - soft bp's last everal MD visits - poor oral hydration, 30 lbs weight loss - encouraged increased hydration, we will stop her lisinopril/hydrochlorothiazide combination and have her come back for nursing visit 1 week to recheck bp/  Continue lasix at current dose, may lower at f/u  3. HFmrEF - repeat echo. Likely mild decrease in setting of aflutter with RVR, hopefully LVEF has improved  4.Weight loss - down 30 lbs since 08/2022, reports just loss of appetite - asked to f/u with pcp   Antoine Poche, M.D.

## 2023-05-02 ENCOUNTER — Ambulatory Visit: Payer: 59

## 2023-06-21 ENCOUNTER — Ambulatory Visit (HOSPITAL_COMMUNITY): Admission: RE | Admit: 2023-06-21 | Payer: 59 | Source: Ambulatory Visit

## 2023-07-11 NOTE — Progress Notes (Deleted)
 Cardiology Office Note:  .   Date:  07/11/2023  ID:  [name] [name], DOB 02/23/1956, MRN [id number] PCP: Micki Riley, Harlin Rain, NP  [facility] Providers Cardiologist:  Dina Rich, MD { Click to update primary MD,subspecialty MD or APP then REFRESH:1}   History of Present Illness: .   [name] [name] is a 68 y.o. female with history of Aflutter s/p TEE/DCCV 06/10/22, chronic systolic CHF EF 45-50%, HTN, HLD  , chronic respiratory failure, COPD,  bipolar disorder, hypertension, hypothyroidism, hyperlipidemia, chronic pain.  Patient had Aflutter with RVR in setting of COPD exacerbation and pneumonia. 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 Patient saw Dr. Wyline Mood 04/2023 with orthostatic dizziness, 30 lb weight loss and poor oral hydration. Lisinopril/hydrochlorothiazide stopped. Repeat echo ordered but not done.  ROS: ***  Studies Reviewed: Marland Kitchen         Prior CV Studies: {Select studies to display:26339}  Limited Echocardiogram 06/05/22 1. Limited echo to assess LVEF.   2. Left ventricular ejection fraction, by estimation, is 45 to 50%. The  left ventricle has mildly decreased function. The left ventricle  demonstrates global hypokinesis. There is mild concentric left ventricular  hypertrophy.   3. The mitral valve is degenerative. Severe mitral annular calcification.   4. Patient is in Afib with RVR during study   Risk Assessment/Calculations:   {Does this patient have ATRIAL FIBRILLATION?:[id number]} No BP recorded.  {Refresh  Note OR Click here to enter BP  :1}***       Physical Exam:   VS:  There were no vitals taken for this visit.   Wt Readings from Last 3 Encounters:  04/24/23 196 lb (88.9 kg)  01/10/23 210 lb (95.3 kg)  12/13/22 212 lb (96.2 kg)    GEN: Well nourished, well developed in no acute distress NECK: No JVD; No carotid bruits CARDIAC: ***RRR, no murmurs, rubs, gallops RESPIRATORY:  Clear to auscultation without rales, wheezing or rhonchi  ABDOMEN: Soft, non-tender, non-distended EXTREMITIES:  No edema; No deformity   ASSESSMENT AND PLAN: .    Aflutter - has done well since DCCV in January - some feeling of heart racing with standing I think more related to orthostatic symptosm as opposed to arrhythmia - continue current meds including eliquis for stroke prevention - EKG today shows   Orthostatic dizziness - soft bp's last everal MD visits - poor oral hydration, 30 lbs weight loss - encouraged increased hydration, we will stop her lisinopril/hydrochlorothiazide combination and have her come back for nursing visit 1 week to recheck bp/  Continue lasix at current dose, may lower at f/u    HFmrEF - repeat echo. Likely mild decrease in setting of aflutter with RVR, hopefully LVEF has improved   Weight loss - down 30 lbs since 08/2022, reports just loss of appetite - asked to f/u with pcp       {Are you ordering a CV Procedure (e.g. stress test, cath, DCCV, TEE, etc)?   Press F2        :[id number]}  Dispo: ***  Signed, Jacolyn Reedy, PA-C

## 2023-07-17 ENCOUNTER — Ambulatory Visit (HOSPITAL_COMMUNITY): Admission: RE | Admit: 2023-07-17 | Payer: 59 | Source: Ambulatory Visit

## 2023-07-21 ENCOUNTER — Encounter (HOSPITAL_COMMUNITY): Payer: Self-pay | Admitting: Emergency Medicine

## 2023-07-21 ENCOUNTER — Emergency Department (HOSPITAL_COMMUNITY): Payer: 59

## 2023-07-21 ENCOUNTER — Emergency Department (HOSPITAL_COMMUNITY)
Admission: EM | Admit: 2023-07-21 | Discharge: 2023-07-22 | Disposition: A | Discharge status: Home / Self Care | Payer: 59 | Attending: Emergency Medicine | Admitting: Emergency Medicine

## 2023-07-21 ENCOUNTER — Other Ambulatory Visit: Payer: Self-pay

## 2023-07-21 DIAGNOSIS — I959 Hypotension, unspecified: Secondary | ICD-10-CM | POA: Insufficient documentation

## 2023-07-21 DIAGNOSIS — J449 Chronic obstructive pulmonary disease, unspecified: Secondary | ICD-10-CM | POA: Diagnosis not present

## 2023-07-21 DIAGNOSIS — Z7951 Long term (current) use of inhaled steroids: Secondary | ICD-10-CM | POA: Insufficient documentation

## 2023-07-21 DIAGNOSIS — E860 Dehydration: Secondary | ICD-10-CM | POA: Diagnosis not present

## 2023-07-21 DIAGNOSIS — Z7901 Long term (current) use of anticoagulants: Secondary | ICD-10-CM | POA: Diagnosis not present

## 2023-07-21 DIAGNOSIS — K5900 Constipation, unspecified: Secondary | ICD-10-CM | POA: Diagnosis not present

## 2023-07-21 DIAGNOSIS — E119 Type 2 diabetes mellitus without complications: Secondary | ICD-10-CM | POA: Diagnosis not present

## 2023-07-21 DIAGNOSIS — R55 Syncope and collapse: Secondary | ICD-10-CM | POA: Insufficient documentation

## 2023-07-21 DIAGNOSIS — R109 Unspecified abdominal pain: Secondary | ICD-10-CM | POA: Insufficient documentation

## 2023-07-21 DIAGNOSIS — R001 Bradycardia, unspecified: Secondary | ICD-10-CM | POA: Insufficient documentation

## 2023-07-21 LAB — CBC
HCT: 31.7 % — ABNORMAL LOW (ref 36.0–46.0)
Hemoglobin: 10.1 g/dL — ABNORMAL LOW (ref 12.0–15.0)
MCH: 28.1 pg (ref 26.0–34.0)
MCHC: 31.9 g/dL (ref 30.0–36.0)
MCV: 88.3 fL (ref 80.0–100.0)
Platelets: 157 10*3/uL (ref 150–400)
RBC: 3.59 MIL/uL — ABNORMAL LOW (ref 3.87–5.11)
RDW: 13.8 % (ref 11.5–15.5)
WBC: 10 10*3/uL (ref 4.0–10.5)
nRBC: 0 % (ref 0.0–0.2)

## 2023-07-21 LAB — LACTIC ACID, PLASMA: Lactic Acid, Venous: 1.3 mmol/L (ref 0.5–1.9)

## 2023-07-21 LAB — BASIC METABOLIC PANEL
Anion gap: 11 (ref 5–15)
BUN: 34 mg/dL — ABNORMAL HIGH (ref 8–23)
CO2: 31 mmol/L (ref 22–32)
Calcium: 8.8 mg/dL — ABNORMAL LOW (ref 8.9–10.3)
Chloride: 97 mmol/L — ABNORMAL LOW (ref 98–111)
Creatinine, Ser: 0.95 mg/dL (ref 0.44–1.00)
GFR, Estimated: >60 mL/min (ref >60)
Glucose, Bld: 193 mg/dL — ABNORMAL HIGH (ref 70–99)
Potassium: 3.8 mmol/L (ref 3.5–5.1)
Sodium: 139 mmol/L (ref 135–145)

## 2023-07-21 LAB — HEPATIC FUNCTION PANEL
ALT: 10 U/L (ref 0–44)
AST: 13 U/L — ABNORMAL LOW (ref 15–41)
Albumin: 3.3 g/dL — ABNORMAL LOW (ref 3.5–5.0)
Alkaline Phosphatase: 67 U/L (ref 38–126)
Bilirubin, Direct: 0.1 mg/dL (ref 0.0–0.2)
Indirect Bilirubin: 0.6 mg/dL (ref 0.3–0.9)
Total Bilirubin: 0.7 mg/dL (ref 0.0–1.2)
Total Protein: 6.9 g/dL (ref 6.5–8.1)

## 2023-07-21 LAB — LIPASE, BLOOD: Lipase: 31 U/L (ref 11–51)

## 2023-07-21 MED ORDER — SODIUM CHLORIDE 0.9 % IV BOLUS
1000.0000 mL | Freq: Once | INTRAVENOUS | Status: AC
  Administered 2023-07-21: 1000 mL via INTRAVENOUS

## 2023-07-21 MED ORDER — IOHEXOL 300 MG/ML  SOLN
100.0000 mL | Freq: Once | INTRAMUSCULAR | Status: AC | PRN
  Administered 2023-07-21: 100 mL via INTRAVENOUS

## 2023-07-21 NOTE — ED Notes (Signed)
 Patient transported to CT
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
## 2023-07-21 NOTE — ED Provider Notes (Signed)
 11:28 PM Assumed care from Dr. Charm Barges, please see their note for full history, physical and decision making until this point. In brief this is a 68 y.o. year old female who presented to the ED tonight with Near Syncope and Hypotension     Labs complete, ambulate, dispo.  Labs/imaging reviewed. Soft pressures improved. Orthostatics reassuring and no symptoms with orthostatics. Ambulated without difficulty and overall feels better. Discussed continued rehydration at home and pcp follow up. Hold lasix and metoprolol for the next 24 hours.  Discharge instructions, including strict return precautions for new or worsening symptoms, given. Patient and/or family verbalized understanding and agreement with the plan as described.   Labs, studies and imaging reviewed by myself and considered in medical decision making if ordered. Imaging interpreted by radiology.  Labs Reviewed  BASIC METABOLIC PANEL - Abnormal; Notable for the following components:      Result Value   Chloride 97 (*)    Glucose, Bld 193 (*)    BUN 34 (*)    Calcium 8.8 (*)    All other components within normal limits  CBC - Abnormal; Notable for the following components:   RBC 3.59 (*)    Hemoglobin 10.1 (*)    HCT 31.7 (*)    All other components within normal limits  HEPATIC FUNCTION PANEL - Abnormal; Notable for the following components:   Albumin 3.3 (*)    AST 13 (*)    All other components within normal limits  CULTURE, BLOOD (ROUTINE X 2)  CULTURE, BLOOD (ROUTINE X 2)  LACTIC ACID, PLASMA  LIPASE, BLOOD  URINALYSIS, ROUTINE W REFLEX MICROSCOPIC  LACTIC ACID, PLASMA  CBG MONITORING, ED    DG Chest Port 1 View  Final Result    CT Head Wo Contrast  Final Result    CT ABDOMEN PELVIS W CONTRAST  Final Result      No follow-ups on file.    Marily Memos, MD 07/22/23 [phone]

## 2023-07-21 NOTE — ED Triage Notes (Signed)
 Pt bib EMS after she fell at home. Denies any injury-states she landed on her "rear-end". Pt reports dizziness prior to fall. EMS reports pt hypotensive at 73/52. CBG 227. Pt reported to EMS that she has been constipated x 3 months and that she took an "at home" test for UTI which was "positive". Pt reports dysuria and feeling "like I have to pee all the time".

## 2023-07-21 NOTE — ED Notes (Signed)
 Called into patient's room due to "bleeding from arm". Upon entering room, patient had removed IV; bleeding controlled at this time. Bloody linen removed from under patient. Patient and family anxious at this time, verbal reassurance given to all parties

## 2023-07-22 DIAGNOSIS — R55 Syncope and collapse: Secondary | ICD-10-CM | POA: Diagnosis not present

## 2023-07-22 LAB — URINALYSIS, ROUTINE W REFLEX MICROSCOPIC
Bacteria, UA: NONE SEEN
Bilirubin Urine: NEGATIVE
Glucose, UA: NEGATIVE mg/dL
Hgb urine dipstick: NEGATIVE
Ketones, ur: NEGATIVE mg/dL
Nitrite: NEGATIVE
Protein, ur: NEGATIVE mg/dL
Specific Gravity, Urine: >1.046 — ABNORMAL HIGH (ref 1.005–1.030)
pH: 6 (ref 5.0–8.0)

## 2023-07-22 LAB — LACTIC ACID, PLASMA: Lactic Acid, Venous: 1 mmol/L (ref 0.5–1.9)

## 2023-07-22 MED ORDER — ONDANSETRON 4 MG PO TBDP: 4.0000 mg | ORAL_TABLET | Freq: Three times a day (TID) | ORAL | 0 refills | Status: AC | PRN

## 2023-07-22 MED ORDER — LACTATED RINGERS IV BOLUS
1000.0000 mL | Freq: Once | INTRAVENOUS | Status: AC
  Administered 2023-07-22: 1000 mL via INTRAVENOUS

## 2023-07-22 NOTE — Discharge Instructions (Signed)
 Hold your Lasix and Metoprolol for the next 24 hours then resume as prescribed.

## 2023-07-24 ENCOUNTER — Ambulatory Visit: Payer: 59 | Admitting: Physician Assistant

## 2023-07-24 LAB — URINE CULTURE: Culture: NO GROWTH

## 2023-07-26 LAB — CULTURE, BLOOD (ROUTINE X 2)
Culture: NO GROWTH
Culture: NO GROWTH
Special Requests: ADEQUATE
Special Requests: ADEQUATE

## 2023-08-16 ENCOUNTER — Ambulatory Visit: Attending: Physician Assistant | Admitting: Physician Assistant

## 2023-08-16 ENCOUNTER — Encounter: Payer: Self-pay | Admitting: Physician Assistant

## 2023-08-16 ENCOUNTER — Ambulatory Visit: Attending: Physician Assistant

## 2023-08-16 VITALS — BP 140/80 | HR 60 | Ht 60.0 in | Wt 195.0 lb

## 2023-08-16 DIAGNOSIS — I10 Essential (primary) hypertension: Secondary | ICD-10-CM

## 2023-08-16 DIAGNOSIS — I4892 Unspecified atrial flutter: Secondary | ICD-10-CM

## 2023-08-16 DIAGNOSIS — R002 Palpitations: Secondary | ICD-10-CM

## 2023-08-16 DIAGNOSIS — I5020 Unspecified systolic (congestive) heart failure: Secondary | ICD-10-CM | POA: Diagnosis not present

## 2023-08-16 DIAGNOSIS — R253 Fasciculation: Secondary | ICD-10-CM

## 2023-08-16 DIAGNOSIS — R072 Precordial pain: Secondary | ICD-10-CM

## 2023-08-16 MED ORDER — APIXABAN 5 MG PO TABS: 5.0000 mg | ORAL_TABLET | Freq: Two times a day (BID) | ORAL | 3 refills | Status: AC

## 2023-08-16 NOTE — Progress Notes (Unsigned)
Enrolled for [employer] to mail a ZIO XT long term holter monitor to the patients address on file.  ? ?Dr. Jonathan Branch to read. ?

## 2023-08-16 NOTE — Progress Notes (Signed)
 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  History of Present Illness [name] [name] is a 68 y.o. female  She was last seen by Dr. Wyline Mood in 04/2023. Follow up TTE was to be done but she did not have this.  She did go to the emergency room 07/21/2023 at [facility] with near syncope and hypotension.  Workup was fairly unremarkable.  She was placed on IV fluids with improved symptoms and blood pressure.  She is here with her daughter. She experiences recurrent palpitations and chest pain, describing the palpitations as feeling like 'a little butterfly' inside her chest. The chest pain is dull, located near the breastbone, and does not radiate. These symptoms have been present for about a month, occurring together, and are not associated with physical exertion, although exertion can prolong the discomfort. The pain lasts for a few minutes and is described as dull without pressure or heaviness. No shortness of breath beyond her baseline COPD symptoms, and no swelling in her legs. She has experienced weight loss, dropping from 230 pounds to 195 pounds, attributed to decreased appetite and reduced food intake. She denies any recent alcohol  or drug use and has a history of smoking. She is not aware of any family history of heart disease. She has not had a stress test before.   ROS-See HPI    Studies Reviewed:   EKG Interpretation Date/Time:  Wednesday August 16 2023 10:42:15 EDT Ventricular Rate:  61 PR Interval:  188 QRS Duration:  100 QT Interval:  434 QTC Calculation: 436 R Axis:   60  Text Interpretation: Normal sinus rhythm Normal ECG Confirmed by Tereso Newcomer [phone]) on 08/16/2023 10:50:51 AM    EKG personally reviewed from 07/21/2023 and demonstrated sinus bradycardia heart rate 57 QTc 479 no ST-T wave changes Data from ED 07/21/23: K 3.8, BUN 34, creatinine 0.95, Hgb 10.1, blood cultures x 2 negative,  head CT no acute abnormality  abdominal pelvic CT with aortic atherosclerosis and some findings consistent with enteritis  chest x-ray no acute disease  urinalysis with moderate leukocyte esterase; urine culture with no growth Results   Risk Assessment/Calculations:    CHA2DS2-VASc Score = 4   This indicates a 4.8% annual risk of stroke. The patient's score is based upon: CHF History: 1 HTN History: 1 Diabetes History: 0 Stroke History: 0 Vascular Disease History: 0 Age Score: 1 Gender Score: 1    HYPERTENSION CONTROL Vitals:   08/16/23 1032 08/16/23 1754  BP: (!) 140/70 (!) 140/80    The patient's blood pressure is elevated above target today.  In order to address the patient's elevated BP: Blood pressure will be monitored at home to determine if medication changes  need to be made.          Physical Exam:   VS:  BP (!) 140/80   Pulse 60   Ht 5' (1.524 m)   Wt 195 lb (88.5 kg)   SpO2 95%   BMI 38.08 kg/m    Wt Readings from Last 3 Encounters:  08/16/23 195 lb (88.5 kg)  07/21/23 196 lb 3.4 oz (89 kg)  04/24/23 196 lb (88.9 kg)    Constitutional:      Appearance: Healthy appearance. Not in distress.  Neck:     Vascular: JVD normal.  Pulmonary:     Breath sounds: No wheezing. No  rales.  Cardiovascular:     Normal rate. Regular rhythm.  Edema:    Peripheral edema absent.  Abdominal:     Palpations: Abdomen is soft.        Assessment and Plan:   Assessment & Plan Atrial Flutter She notes recurrent palpitations and chest pain.  She is concerned she was back in atrial flutter.  EKG shows sinus rhythm.  She had previous cardioversion in January 2024. EP evaluation in 2024 did not recommend flecainide, amiodarone, Multaq, sotalol, or dofetilide due to structural heart disease, COPD, and QT prolongation risks.  She is not currently on Eliquis, for unclear reasons.  Her stroke risk is elevated. 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 Echocardiogram in January 2024 showed EF 45-50.  - Order follow-up echocardiogram  - Continue Lasix 40 mg daily - Continue metoprolol tartrate 25 mg twice daily  Chest Pain Chest pain associated with palpitations and exertion. Significant risk factors for coronary artery disease include hypertension, hyperlipidemia, COPD, and history of smoking.  - Order coronary CTA to evaluate for obstructive coronary artery disease  Hypertension Blood pressure is borderline elevated. Recent episode of hypotension in February. Previously on lisinopril, which was stopped due to low blood pressure. Current blood pressure is 140/80 mmHg.  - Monitor blood pressures at home - Obtain BMP and CBC - Consider resuming low-dose ACE inhibitor or ARB if renal function is stable and blood pressures remain elevated       Dispo:  Return in about 3 months (around 11/16/2023) for Follow up after testing.  Signed, Tereso Newcomer, PA-C

## 2023-08-16 NOTE — Patient Instructions (Addendum)
 Medication Instructions:  Your physician has recommended you make the following change in your medication: .  START Eliquis 5 mg taking 1 twice a day  *If you need a refill on your cardiac medications before your next appointment, please call your pharmacy*   Lab Work: TODAY:  BMET, CBC, & TSH  If you have labs (blood work) drawn today and your tests are completely normal, you will receive your results only by: MyChart Message (if you have MyChart) OR A paper copy in the mail If you have any lab test that is abnormal or we need to change your treatment, we will call you to review the results.   Testing/Procedures:  [name] [name]- Long Term Monitor Instructions  Your physician has requested you wear a ZIO patch monitor for 14 days.  This is a single patch monitor. [employer] supplies one patch monitor per enrollment. Additional stickers are not available. Please do not apply patch if you will be having a Nuclear Stress Test,  Echocardiogram, Cardiac CT, MRI, or Chest Xray during the period you would be wearing the  monitor. The patch cannot be worn during these tests. You cannot remove and re-apply the  ZIO XT patch monitor.  Your ZIO patch monitor will be mailed 3 day USPS to your address on file. It may take 3-5 days  to receive your monitor after you have been enrolled.  Once you have received your monitor, please review the enclosed instructions. Your monitor  has already been registered assigning a specific monitor serial # to you.  Billing and Patient Assistance Program Information  We have supplied [employer] with any of your insurance information on file for billing purposes. [employer] offers a sliding scale Patient Assistance Program for patients that do not have  insurance, or whose insurance does not completely cover the cost of the ZIO monitor.  You must apply for the Patient Assistance Program to qualify for this discounted rate.  To apply, please call [employer] at [phone],  select option 4, select option 2, ask to apply for  Patient Assistance Program. [name] [name] will ask your household income, and how many people  are in your household. They will quote your out-of-pocket cost based on that information.  [employer] will also be able to set up a 4-month, interest-free payment plan if needed.  Applying the monitor   Shave hair from upper left chest.  Hold abrader disc by orange tab. Rub abrader in 40 strokes over the upper left chest as  indicated in your monitor instructions.  Clean area with 4 enclosed alcohol pads. Let dry.  Apply patch as indicated in monitor instructions. Patch will be placed under collarbone on left  side of chest with arrow pointing upward.  Rub patch adhesive wings for 2 minutes. Remove white label marked "1". Remove the white  label marked "2". Rub patch adhesive wings for 2 additional minutes.  While looking in a mirror, press and release button in center of patch. A small green light will  flash 3-4 times. This will be your only indicator that the monitor has been turned on.  Do not shower for the first 24 hours. You may shower after the first 24 hours.  Press the button if you feel a symptom. You will hear a small click. Record Date, Time and  Symptom in the Patient Logbook.  When you are ready to remove the patch, follow instructions on the last 2 pages of Patient  Logbook. Stick patch monitor onto the last page of  Patient Logbook.  Place Patient Logbook in the blue and white box. Use locking tab on box and tape box closed  securely. The blue and white box has prepaid postage on it. Please place it in the mailbox as  soon as possible. Your physician should have your test results approximately 7 days after the  monitor has been mailed back to [facility].  Call [name] Customer Care at [phone] if you have questions regarding  your ZIO XT patch monitor. Call them immediately if you see an orange light blinking on your   monitor.  If your monitor falls off in less than 4 days, contact our Monitor department at [phone].  If your monitor becomes loose or falls off after 4 days call [employer] at [phone] for  suggestions on securing your monitor    Your physician has requested that you have an echocardiogram. Echocardiography is a painless test that uses sound waves to create images of your heart. It provides your doctor with information about the size and shape of your heart and how well your heart's chambers and valves are working. This procedure takes approximately one hour. There are no restrictions for this procedure. Please do NOT wear cologne, perfume, aftershave, or lotions (deodorant is allowed). Please arrive 15 minutes prior to your appointment time.  Please note: We ask at that you not bring children with you during ultrasound (echo/ vascular) testing. Due to room size and safety concerns, children are not allowed in the ultrasound rooms during exams. Our front office staff cannot provide observation of children in our lobby area while testing is being conducted. An adult accompanying a patient to their appointment will only be allowed in the ultrasound room at the discretion of the ultrasound technician under special circumstances. We apologize for any inconvenience.    Your cardiac CT will be scheduled at one of the below locations:   [facility] [address] [place], [place] [address] [phone]   When scheduled at [facility], please arrive at the [facility] and Children's Entrance (Entrance C2) of [facility] 30 minutes prior to test start time. You can use the FREE valet parking offered at entrance C (encouraged to control the heart rate for the test)  Proceed to the [facility] Radiology Department (first floor) to check-in and test prep.  All radiology patients and guests should use entrance C2 at [facility], accessed from [facility], even though the hospital's physical address listed is [address]     Please follow these instructions carefully (unless otherwise directed):  An IV will be required for this test and Nitroglycerin will be given.  Hold all erectile dysfunction medications at least 3 days (72 hrs) prior to test. (Ie viagra, cialis, sildenafil, tadalafil, etc)   On the Night Before the Test: Be sure to Drink plenty of water. Do not consume any caffeinated/decaffeinated beverages or chocolate 12 hours prior to your test. Do not take any antihistamines 12 hours prior to your test.   On the Day of the Test: Drink plenty of water until 1 hour prior to the test. Do not eat any food 1 hour prior to test. You may take your regular medications prior to the test.  Take metoprolol (Lopressor) two hours prior to test. Your normal dose.  If you take Furosemide.Marland KitchenMarland KitchenMarland KitchenMarland Kitchenplease HOLD on the morning of the test. Patients who wear a continuous glucose monitor MUST remove the device prior to scanning. FEMALES- please wear underwire-free  bra if available, avoid dresses & tight clothing  After the Test: Drink plenty of water. After receiving IV contrast, you may experience a mild flushed feeling. This is normal. On occasion, you may experience a mild rash up to 24 hours after the test. This is not dangerous. If this occurs, you can take Benadryl 25 mg, Zyrtec, Claritin, or Allegra and increase your fluid intake. (Patients taking Tikosyn should avoid Benadryl, and may take Zyrtec, Claritin, or Allegra) If you experience trouble breathing, this can be serious. If it is severe call 911 IMMEDIATELY. If it is mild, please call our office.  We will call to schedule your test 2-4 weeks out understanding that some insurance companies will need an authorization prior to the service being performed.   For more information and frequently asked questions, please visit our website :  [email or web address]  For non-scheduling related questions, please contact the cardiac imaging nurse navigator should you have any questions/concerns: Cardiac Imaging Nurse Navigators Direct Office Dial: [phone]   For scheduling needs, including cancellations and rescheduling, please call [place], [phone].    Follow-Up: At [facility], you and your health needs are our priority.  As part of our continuing mission to provide you with exceptional heart care, we have created designated Provider Care Teams.  These Care Teams include your primary Cardiologist (physician) and Advanced Practice Providers (APPs -  Physician Assistants and Nurse Practitioners) who all work together to provide you with the care you need, when you need it.  We recommend signing up for the patient portal called "MyChart".  Sign up information is provided on this After Visit Summary.  MyChart is used to connect with patients for Virtual Visits (Telemedicine).  Patients are able to view lab/test results, encounter notes, upcoming appointments, etc.  Non-urgent messages can be sent to your provider as well.   To learn more about what you can do with MyChart, go to [email or web address].    Your next appointment:   3 month(s)  Provider:   Tereso Newcomer, PA-C         Other Instructions

## 2023-08-17 LAB — BASIC METABOLIC PANEL WITH GFR
BUN/Creatinine Ratio: 37 — ABNORMAL HIGH (ref 12–28)
BUN: 26 mg/dL (ref 8–27)
CO2: 28 mmol/L (ref 20–29)
Calcium: 9.4 mg/dL (ref 8.7–10.3)
Chloride: 101 mmol/L (ref 96–106)
Creatinine, Ser: 0.7 mg/dL (ref 0.57–1.00)
Glucose: 96 mg/dL (ref 70–99)
Potassium: 4.7 mmol/L (ref 3.5–5.2)
Sodium: 143 mmol/L (ref 134–144)
eGFR: 95 mL/min/{1.73_m2} (ref >59)

## 2023-08-17 LAB — CBC
Hematocrit: 36.7 % (ref 34.0–46.6)
Hemoglobin: 11.5 g/dL (ref 11.1–15.9)
MCH: 27.4 pg (ref 26.6–33.0)
MCHC: 31.3 g/dL — ABNORMAL LOW (ref 31.5–35.7)
MCV: 87 fL (ref 79–97)
Platelets: 154 10*3/uL (ref 150–450)
RBC: 4.2 x10E6/uL (ref 3.77–5.28)
RDW: 13.3 % (ref 11.7–15.4)
WBC: 6 10*3/uL (ref 3.4–10.8)

## 2023-08-17 LAB — TSH: TSH: 3 u[IU]/mL (ref 0.450–4.500)

## 2023-08-30 ENCOUNTER — Ambulatory Visit (HOSPITAL_COMMUNITY)
Admission: RE | Admit: 2023-08-30 | Discharge: 2023-08-30 | Disposition: A | Discharge status: Home / Self Care | Source: Ambulatory Visit | Attending: Physician Assistant | Admitting: Physician Assistant

## 2023-08-30 ENCOUNTER — Encounter: Payer: Self-pay | Admitting: Physician Assistant

## 2023-08-30 DIAGNOSIS — I080 Rheumatic disorders of both mitral and aortic valves: Secondary | ICD-10-CM | POA: Insufficient documentation

## 2023-08-30 DIAGNOSIS — I4892 Unspecified atrial flutter: Secondary | ICD-10-CM | POA: Insufficient documentation

## 2023-08-30 DIAGNOSIS — I77810 Thoracic aortic ectasia: Secondary | ICD-10-CM

## 2023-08-30 DIAGNOSIS — R002 Palpitations: Secondary | ICD-10-CM | POA: Insufficient documentation

## 2023-08-30 DIAGNOSIS — R55 Syncope and collapse: Secondary | ICD-10-CM

## 2023-08-30 HISTORY — DX: Thoracic aortic ectasia: I77.810

## 2023-08-30 HISTORY — DX: Rheumatic disorders of both mitral and aortic valves: I08.0

## 2023-08-30 LAB — ECHOCARDIOGRAM COMPLETE
AR max vel: 2.11 cm2
AV Area VTI: 1.98 cm2
AV Area mean vel: 2.17 cm2
AV Mean grad: 13.5 mmHg
AV Peak grad: 23 mmHg
Ao pk vel: 2.4 m/s
Area-P 1/2: 2.72 cm2
MV VTI: 2.28 cm2
S' Lateral: 3.2 cm

## 2023-09-01 ENCOUNTER — Encounter (HOSPITAL_COMMUNITY): Payer: Self-pay

## 2023-09-04 ENCOUNTER — Telehealth: Payer: Self-pay | Admitting: Physician Assistant

## 2023-09-04 ENCOUNTER — Telehealth (HOSPITAL_COMMUNITY): Payer: Self-pay | Admitting: Emergency Medicine

## 2023-09-04 NOTE — Telephone Encounter (Signed)
 Reaching out to patient to offer assistance regarding upcoming cardiac imaging study; pt verbalizes understanding of appt date/time, parking situation and where to check in, pre-test NPO status and medications ordered, and verified current allergies; name and call back number provided for further questions should they arise Rockwell Alexandria RN Navigator Cardiac Imaging [facility] [phone] office [phone] cell

## 2023-09-04 NOTE — Telephone Encounter (Signed)
 Left voice message to call back 4/14

## 2023-09-04 NOTE — Telephone Encounter (Signed)
 Follow Up:      Daughter is returning call from today, concerning patient's Echo results.

## 2023-09-05 ENCOUNTER — Ambulatory Visit (HOSPITAL_COMMUNITY)
Admission: RE | Admit: 2023-09-05 | Discharge: 2023-09-05 | Disposition: A | Discharge status: Home / Self Care | Source: Ambulatory Visit | Attending: Physician Assistant | Admitting: Physician Assistant

## 2023-09-05 DIAGNOSIS — Q2112 Patent foramen ovale: Secondary | ICD-10-CM | POA: Diagnosis not present

## 2023-09-05 DIAGNOSIS — I2510 Atherosclerotic heart disease of native coronary artery without angina pectoris: Secondary | ICD-10-CM | POA: Diagnosis not present

## 2023-09-05 DIAGNOSIS — I4892 Unspecified atrial flutter: Secondary | ICD-10-CM | POA: Diagnosis present

## 2023-09-05 DIAGNOSIS — I700 Atherosclerosis of aorta: Secondary | ICD-10-CM | POA: Diagnosis not present

## 2023-09-05 DIAGNOSIS — R002 Palpitations: Secondary | ICD-10-CM | POA: Insufficient documentation

## 2023-09-05 MED ORDER — NITROGLYCERIN 0.4 MG SL SUBL
0.8000 mg | SUBLINGUAL_TABLET | Freq: Once | SUBLINGUAL | Status: AC
  Administered 2023-09-05: 0.8 mg via SUBLINGUAL

## 2023-09-05 MED ORDER — IOHEXOL 350 MG/ML SOLN
100.0000 mL | Freq: Once | INTRAVENOUS | Status: AC | PRN
  Administered 2023-09-05: 100 mL via INTRAVENOUS

## 2023-09-05 MED ORDER — NITROGLYCERIN 0.4 MG SL SUBL
SUBLINGUAL_TABLET | SUBLINGUAL | Status: AC
  Filled 2023-09-05: qty 2

## 2023-09-05 NOTE — Telephone Encounter (Signed)
 [name] returning call from yesterday regarding Echo results. Please advise

## 2023-09-05 NOTE — Telephone Encounter (Signed)
 Returned call however they are currently getting a CT and asked to be called back.

## 2023-09-06 ENCOUNTER — Encounter: Payer: Self-pay | Admitting: Physician Assistant

## 2023-09-06 DIAGNOSIS — I2510 Atherosclerotic heart disease of native coronary artery without angina pectoris: Secondary | ICD-10-CM

## 2023-09-06 HISTORY — DX: Atherosclerotic heart disease of native coronary artery without angina pectoris: I25.10

## 2023-09-11 NOTE — Telephone Encounter (Signed)
 [name] aware of echo results   Echocardiogram demonstrates improved heart function.  Ejection fraction is now normal.  This is good news.  There is moderate leakage of the mitral valve (mitral regurgitation) and mild stiffness of the aortic valve (aortic stenosis).  There is mild enlargement of the aorta at 38 mm. The mitral regurgitation and aortic stenosis are stable compared to the last echocardiogram. We can continue to monitor this with another echocardiogram in 1 year. PLAN: - Continue current medications/treatment plan and follow up as scheduled. - Echocardiogram in 1 year (Dx: MR, AS, dilated ascending aorta) Marlyse Single, PA-C   08/30/2023 4:29 PM

## 2023-09-13 ENCOUNTER — Telehealth: Payer: Self-pay | Admitting: *Deleted

## 2023-09-13 DIAGNOSIS — I350 Nonrheumatic aortic (valve) stenosis: Secondary | ICD-10-CM

## 2023-09-13 NOTE — Telephone Encounter (Signed)
-----   Message from Marlyse Single sent at 08/30/2023  4:41 PM EDT ----- Echocardiogram demonstrates improved heart function.  Ejection fraction is now normal.  This is good news.  There is moderate leakage of the mitral valve (mitral regurgitation) and mild stiffness of the aortic valve (aortic stenosis).  There is mild enlargement of the aorta at 38 mm. The mitral regurgitation and aortic stenosis are stable compared to the last echocardiogram. We can continue to monitor this with another echocardiogram in 1 year.  PLAN:  - Continue current medications/treatment plan and follow up as scheduled.  - Echocardiogram in 1 year (Dx: MR, AS, dilated ascending aorta) Marlyse Single, PA-C    08/30/2023 4:29 PM

## 2023-09-19 ENCOUNTER — Ambulatory Visit: Payer: Self-pay

## 2023-09-19 VITALS — BP 141/64 | HR 78 | Ht 65.0 in | Wt 197.1 lb

## 2023-09-19 DIAGNOSIS — F319 Bipolar disorder, unspecified: Secondary | ICD-10-CM | POA: Diagnosis not present

## 2023-09-19 DIAGNOSIS — M1611 Unilateral primary osteoarthritis, right hip: Secondary | ICD-10-CM

## 2023-09-19 DIAGNOSIS — F341 Dysthymic disorder: Secondary | ICD-10-CM | POA: Diagnosis not present

## 2023-09-19 DIAGNOSIS — M545 Low back pain, unspecified: Secondary | ICD-10-CM | POA: Diagnosis not present

## 2023-09-19 DIAGNOSIS — E782 Mixed hyperlipidemia: Secondary | ICD-10-CM

## 2023-09-19 DIAGNOSIS — I10 Essential (primary) hypertension: Secondary | ICD-10-CM

## 2023-09-19 DIAGNOSIS — G8929 Other chronic pain: Secondary | ICD-10-CM

## 2023-09-19 DIAGNOSIS — G894 Chronic pain syndrome: Secondary | ICD-10-CM

## 2023-09-19 DIAGNOSIS — M1712 Unilateral primary osteoarthritis, left knee: Secondary | ICD-10-CM

## 2023-09-19 MED ORDER — DIVALPROEX SODIUM 500 MG PO DR TAB: 500.0000 mg | DELAYED_RELEASE_TABLET | Freq: Two times a day (BID) | ORAL | 0 refills | Status: DC

## 2023-09-19 MED ORDER — BUSPIRONE HCL 10 MG PO TABS: 10.0000 mg | ORAL_TABLET | Freq: Three times a day (TID) | ORAL | 2 refills | Status: DC

## 2023-09-19 MED ORDER — TIZANIDINE HCL 4 MG PO TABS: 4.0000 mg | ORAL_TABLET | Freq: Four times a day (QID) | ORAL | 5 refills | Status: DC | PRN

## 2023-09-19 MED ORDER — SERTRALINE HCL 100 MG PO TABS: 200.0000 mg | ORAL_TABLET | Freq: Every day | ORAL | 0 refills | Status: DC

## 2023-09-19 MED ORDER — HYDROCODONE-ACETAMINOPHEN 10-325 MG PO TABS: 1.0000 | ORAL_TABLET | Freq: Four times a day (QID) | ORAL | 0 refills | Status: DC | PRN

## 2023-09-19 MED ORDER — QUETIAPINE FUMARATE 50 MG PO TABS
ORAL_TABLET | ORAL | 0 refills | Status: DC
Start: 2023-09-19 — End: 2024-01-16
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
## 2023-09-20 NOTE — Assessment & Plan Note (Signed)
 Agree to refill pain medication for chronic hip pain.   PDMP reviewed during this encounter.  No signs of misuse or use of multiple pharmacies or doctors.  Recommend f/u in one month for refill.

## 2023-09-20 NOTE — Assessment & Plan Note (Signed)
 Chronic in nature.  Agreed to refill tizanidine for as needed use

## 2023-09-20 NOTE — Assessment & Plan Note (Signed)
 Has chronic knee and hip pain due to OA.  Home medication includes Norco 3 times daily as needed which patient states she takes 3 times daily regularly. - Continue home Norco 3 times dail  PDMP reviewed during this encounter.

## 2023-09-20 NOTE — Assessment & Plan Note (Signed)
 Agree to refill pain medication for chronic knee pain.   PDMP reviewed during this encounter.  No signs of misuse or use of multiple pharmacies or doctors.  Recommend f/u in one month for refill.

## 2023-09-20 NOTE — Assessment & Plan Note (Signed)
 Stable at this time. She denies any suicidal ideation. Agreed to refill medications until she can establish with behavioral health. Referral placed for behavioral health.

## 2023-09-26 NOTE — Progress Notes (Signed)
 Pt has been made aware of normal result and verbalized understanding.  jw

## 2023-10-09 ENCOUNTER — Encounter: Payer: Self-pay | Admitting: Physician Assistant

## 2023-10-09 ENCOUNTER — Ambulatory Visit: Payer: Self-pay | Admitting: Physician Assistant

## 2023-10-09 DIAGNOSIS — I4892 Unspecified atrial flutter: Secondary | ICD-10-CM

## 2023-10-09 DIAGNOSIS — R253 Fasciculation: Secondary | ICD-10-CM | POA: Diagnosis not present

## 2023-10-09 DIAGNOSIS — I471 Supraventricular tachycardia, unspecified: Secondary | ICD-10-CM

## 2023-10-09 HISTORY — DX: Supraventricular tachycardia, unspecified: I47.10

## 2023-10-09 MED ORDER — METOPROLOL TARTRATE 25 MG PO TABS
37.5000 mg | ORAL_TABLET | Freq: Two times a day (BID) | ORAL | 11 refills | Status: AC
Start: 2023-10-09 — End: ?

## 2023-10-20 ENCOUNTER — Other Ambulatory Visit: Payer: Self-pay

## 2023-10-20 DIAGNOSIS — F341 Dysthymic disorder: Secondary | ICD-10-CM

## 2023-11-06 ENCOUNTER — Ambulatory Visit: Payer: Self-pay

## 2023-11-06 VITALS — BP 130/80 | HR 76 | Ht 65.0 in | Wt 195.1 lb

## 2023-11-06 DIAGNOSIS — Z1159 Encounter for screening for other viral diseases: Secondary | ICD-10-CM

## 2023-11-06 DIAGNOSIS — G894 Chronic pain syndrome: Secondary | ICD-10-CM | POA: Diagnosis not present

## 2023-11-06 DIAGNOSIS — Z1231 Encounter for screening mammogram for malignant neoplasm of breast: Secondary | ICD-10-CM

## 2023-11-06 DIAGNOSIS — E119 Type 2 diabetes mellitus without complications: Secondary | ICD-10-CM

## 2023-11-06 DIAGNOSIS — Z1211 Encounter for screening for malignant neoplasm of colon: Secondary | ICD-10-CM

## 2023-11-06 DIAGNOSIS — M1712 Unilateral primary osteoarthritis, left knee: Secondary | ICD-10-CM

## 2023-11-06 DIAGNOSIS — M545 Low back pain, unspecified: Secondary | ICD-10-CM | POA: Diagnosis not present

## 2023-11-06 DIAGNOSIS — M1611 Unilateral primary osteoarthritis, right hip: Secondary | ICD-10-CM

## 2023-11-06 MED ORDER — HYDROCODONE-ACETAMINOPHEN 10-325 MG PO TABS: 1.0000 | ORAL_TABLET | Freq: Four times a day (QID) | ORAL | 0 refills | Status: DC | PRN

## 2023-11-06 NOTE — Assessment & Plan Note (Signed)
 Patient is unaware of diabetes diagnosis.  Not on medication for this.  Will recheck labs today.  Also recommend she schedule an eye exam soon.

## 2023-11-06 NOTE — Assessment & Plan Note (Signed)
 Agree to refill pain medication for chronic knee pain.   PDMP reviewed during this encounter.  No signs of misuse or use of multiple pharmacies or doctors.  Recommend f/u in one month for refill.

## 2023-11-06 NOTE — Patient Instructions (Signed)
 F/U in 3 months. Schedule eye exam

## 2023-11-06 NOTE — Progress Notes (Signed)
 Established Patient Office Visit  Subjective   Patient ID: [name] [name], female    DOB: 14-Aug-1955  Age: 68 y.o. MRN: [id number]  Chief Complaint  Patient presents with   Medical Management of Chronic Issues    Med check, refiil on pain med     HPI  Patient Active Problem List   Diagnosis Date Noted   SVT (supraventricular tachycardia) (HCC) 10/09/2023   CAD (coronary artery disease) 09/06/2023   Mitral regurgitation and aortic stenosis 08/30/2023   Ascending aorta dilation (HCC) 08/30/2023   Nicotine  use disorder 09/15/2022   AKI (acute kidney injury) (HCC) 09/15/2022   Chronic anemia 09/15/2022   Mood disorder (HCC) 09/15/2022   Hypermagnesemia 09/14/2022   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) 06/10/2022   Community acquired bacterial pneumonia 06/10/2022   Atrial flutter (HCC) 06/04/2022   Urinary retention 06/04/2022   Acute hypoxic respiratory failure (HCC) 06/03/2022   Osteoarthritis of right hip 03/09/2022   Pain in joint of right hip 03/09/2022   Pain in joint of left knee 03/09/2022   Palliative care by specialist    DNR (do not resuscitate)    Body mass index 30.0-30.9, adult 06/12/2018   Bipolar 1 disorder (HCC) 01/11/2018   Healthcare maintenance 05/10/2017   Arthritis of left knee 05/10/2017   Essential hypertension 04/27/2017   Other insomnia 04/27/2017   Chronic pain syndrome 04/27/2017   Recurrent major depressive disorder, in partial remission (HCC) 04/27/2017   Chronic, continuous use of opioids 04/27/2017   Headache syndrome 04/27/2017   COPD (chronic obstructive pulmonary disease) (HCC) 12/27/2012   Chronic respiratory failure (HCC) 12/27/2012   History of colonic polyps 12/11/2012   Chronic diarrhea 12/11/2012   Benign neoplasm of colon 12/11/2012   Migraine 12/04/2012   Alcohol abuse 12/04/2012   Hyperlipidemia 07/08/2012   COPD exacerbation (HCC) 04/13/2012   Low back pain 04/13/2012   Type 2 diabetes mellitus (HCC)  04/13/2012   Tobacco abuse 04/13/2012   Acute-on-chronic respiratory failure (HCC) 04/13/2012   Depression    Suicidal thoughts       ROS    Objective:     BP 130/80 (BP Location: Left Arm, Patient Position: Sitting, Cuff Size: Normal)   Pulse 76   Ht 5' 5 (1.651 m)   Wt 195 lb 1.9 oz (88.5 kg)   SpO2 (!) 84%   BMI 32.47 kg/m  BP Readings from Last 3 Encounters:  11/06/23 130/80  09/19/23 (!) 141/64  09/05/23 137/76      Physical Exam Vitals and nursing note reviewed.  Constitutional:      Appearance: Normal appearance.  HENT:     Head: Normocephalic.   Eyes:     Extraocular Movements: Extraocular movements intact.     Pupils: Pupils are equal, round, and reactive to light.    Cardiovascular:     Rate and Rhythm: Normal rate and regular rhythm.  Pulmonary:     Effort: Pulmonary effort is normal.     Breath sounds: Normal breath sounds.  Abdominal:     General: Bowel sounds are normal.   Musculoskeletal:     Right hip: Tenderness present. Decreased range of motion.     Left hip: Normal.     Right knee: Normal.     Left knee: Swelling present. Decreased range of motion. Tenderness present.   Neurological:     Mental Status: She is alert and oriented to person, place, and time. Mental status is at baseline.     Gait:  Gait abnormal (antalgic).   Psychiatric:        Mood and Affect: Mood normal.        Thought Content: Thought content normal.     No results found for any visits on 11/06/23.  Last CBC Lab Results  Component Value Date   WBC 6.0 08/16/2023   HGB 11.5 08/16/2023   HCT 36.7 08/16/2023   MCV 87 08/16/2023   MCH 27.4 08/16/2023   RDW 13.3 08/16/2023   PLT 154 08/16/2023   Last metabolic panel Lab Results  Component Value Date   GLUCOSE 96 08/16/2023   NA 143 08/16/2023   K 4.7 08/16/2023   CL 101 08/16/2023   CO2 28 08/16/2023   BUN 26 08/16/2023   CREATININE 0.70 08/16/2023   EGFR 95 08/16/2023   CALCIUM  9.4 08/16/2023    PHOS 3.4 02/11/2020   PROT 6.9 07/21/2023   ALBUMIN 3.3 (L) 07/21/2023   BILITOT 0.7 07/21/2023   ALKPHOS 67 07/21/2023   AST 13 (L) 07/21/2023   ALT 10 07/21/2023   ANIONGAP 11 07/21/2023   Last hemoglobin A1c Lab Results  Component Value Date   HGBA1C 6.7 (H) 06/04/2022   Last vitamin D No results found for: 25OHVITD2, 25OHVITD3, VD25OH    The 10-year ASCVD risk score (Arnett DK, et al., 2019) is: 17.4%    Assessment & Plan:   Problem List Items Addressed This Visit       Endocrine   Type 2 diabetes mellitus (HCC)   Patient is unaware of diabetes diagnosis.  Not on medication for this.  Will recheck labs today.  Also recommend she schedule an eye exam soon.      Relevant Orders   HgB A1c   CMP14+EGFR     Musculoskeletal and Integument   Arthritis of left knee   Agree to refill pain medication for chronic knee pain.   PDMP reviewed during this encounter.  No signs of misuse or use of multiple pharmacies or doctors.  Recommend f/u in one month for refill.         Relevant Medications   HYDROcodone -acetaminophen  (NORCO) 10-325 MG tablet   Osteoarthritis of right hip   Agree to refill pain medication for chronic knee pain.   PDMP reviewed during this encounter.  No signs of misuse or use of multiple pharmacies or doctors.  Recommend f/u in one month for refill.        Relevant Medications   HYDROcodone -acetaminophen  (NORCO) 10-325 MG tablet     Other   Low back pain   Agree to refill pain medication for chronic knee pain.   PDMP reviewed during this encounter.  No signs of misuse or use of multiple pharmacies or doctors.  Recommend f/u in one month for refill.        Relevant Medications   HYDROcodone -acetaminophen  (NORCO) 10-325 MG tablet   Chronic pain syndrome - Primary   Agree to refill pain medication for chronic knee pain.   PDMP reviewed during this encounter.  No signs of misuse or use of multiple pharmacies or doctors.  Recommend f/u in one  month for refill.        Relevant Medications   HYDROcodone -acetaminophen  (NORCO) 10-325 MG tablet   Other Visit Diagnoses       Need for hepatitis C screening test       Relevant Orders   Hepatitis C Antibody     Screening for colon cancer       Relevant Orders  Cologuard     Encounter for screening mammogram for malignant neoplasm of breast       Relevant Orders   MM 3D SCREENING MAMMOGRAM BILATERAL BREAST       No follow-ups on file.    Alison Irvine, FNP

## 2023-11-07 LAB — HEMOGLOBIN A1C
Est. average glucose Bld gHb Est-mCnc: 123 mg/dL
Hgb A1c MFr Bld: 5.9 % — ABNORMAL HIGH (ref 4.8–5.6)

## 2023-11-07 LAB — CMP14+EGFR
ALT: 7 IU/L (ref 0–32)
AST: 12 IU/L (ref 0–40)
Albumin: 4.2 g/dL (ref 3.9–4.9)
Alkaline Phosphatase: 75 IU/L (ref 44–121)
BUN/Creatinine Ratio: 34 — ABNORMAL HIGH (ref 12–28)
BUN: 23 mg/dL (ref 8–27)
Bilirubin Total: 0.3 mg/dL (ref 0.0–1.2)
CO2: 26 mmol/L (ref 20–29)
Calcium: 9.9 mg/dL (ref 8.7–10.3)
Chloride: 100 mmol/L (ref 96–106)
Creatinine, Ser: 0.67 mg/dL (ref 0.57–1.00)
Globulin, Total: 2.6 g/dL (ref 1.5–4.5)
Glucose: 97 mg/dL (ref 70–99)
Potassium: 4.7 mmol/L (ref 3.5–5.2)
Sodium: 143 mmol/L (ref 134–144)
Total Protein: 6.8 g/dL (ref 6.0–8.5)
eGFR: 96 mL/min/{1.73_m2} (ref >59)

## 2023-11-07 LAB — HEPATITIS C ANTIBODY: Hep C Virus Ab: NONREACTIVE

## 2023-11-17 ENCOUNTER — Ambulatory Visit: Admitting: Physician Assistant

## 2023-11-28 ENCOUNTER — Other Ambulatory Visit: Payer: Self-pay

## 2023-11-28 DIAGNOSIS — F341 Dysthymic disorder: Secondary | ICD-10-CM

## 2023-11-28 DIAGNOSIS — F319 Bipolar disorder, unspecified: Secondary | ICD-10-CM

## 2023-11-30 ENCOUNTER — Ambulatory Visit: Payer: Self-pay

## 2023-12-01 ENCOUNTER — Ambulatory Visit

## 2023-12-01 VITALS — BP 108/71 | HR 82 | Ht 65.0 in | Wt 179.0 lb

## 2023-12-01 DIAGNOSIS — M1712 Unilateral primary osteoarthritis, left knee: Secondary | ICD-10-CM

## 2023-12-01 DIAGNOSIS — M1611 Unilateral primary osteoarthritis, right hip: Secondary | ICD-10-CM | POA: Diagnosis not present

## 2023-12-01 DIAGNOSIS — N61 Mastitis without abscess: Secondary | ICD-10-CM | POA: Diagnosis not present

## 2023-12-01 DIAGNOSIS — G894 Chronic pain syndrome: Secondary | ICD-10-CM

## 2023-12-01 DIAGNOSIS — M545 Low back pain, unspecified: Secondary | ICD-10-CM | POA: Diagnosis not present

## 2023-12-01 DIAGNOSIS — G8929 Other chronic pain: Secondary | ICD-10-CM

## 2023-12-01 MED ORDER — CLINDAMYCIN HCL 300 MG PO CAPS
300.0000 mg | ORAL_CAPSULE | Freq: Four times a day (QID) | ORAL | 0 refills | Status: AC
Start: 2023-12-01 — End: 2023-12-08

## 2023-12-01 MED ORDER — HYDROCODONE-ACETAMINOPHEN 10-325 MG PO TABS: 1.0000 | ORAL_TABLET | Freq: Four times a day (QID) | ORAL | 0 refills | Status: DC | PRN

## 2023-12-01 NOTE — Progress Notes (Unsigned)
   Established Patient Office Visit  Subjective   Patient ID: [name] [name], female    DOB: 02-Jan-1956  Age: 68 y.o. MRN: [id number]  Chief Complaint  Patient presents with   Establish Care    Pt here to establish care and also concerned on pain and redness on right breast     HPI  {History (Optional):23778}  ROS    Objective:     BP 108/71   Pulse 82   Ht 5' 5 (1.651 m)   Wt 179 lb (81.2 kg)   SpO2 (!) 85%   BMI 29.79 kg/m  {Vitals History (Optional):23777}  Physical Exam   No results found for any visits on 12/01/23.  {Labs (Optional):23779}  The 10-year ASCVD risk score (Arnett DK, et al., 2019) is: 12.3%    Assessment & Plan:   Problem List Items Addressed This Visit   None   No follow-ups on file.    Leita Longs, FNP

## 2023-12-01 NOTE — Patient Instructions (Signed)
 Antibiotic sent to your pharmacy.  I want you to reach out to me early next week if your symptoms are not improving.    We will get mammogram once your symptoms have improved.

## 2023-12-04 NOTE — Assessment & Plan Note (Signed)
 Agree to refill pain medication for chronic knee pain.   PDMP reviewed during this encounter.  No signs of misuse or use of multiple pharmacies or doctors.  Recommend f/u in one month for refill.

## 2023-12-06 ENCOUNTER — Ambulatory Visit: Payer: Self-pay

## 2023-12-10 DIAGNOSIS — J449 Chronic obstructive pulmonary disease, unspecified: Secondary | ICD-10-CM | POA: Diagnosis not present

## 2023-12-19 ENCOUNTER — Other Ambulatory Visit: Payer: Self-pay

## 2023-12-19 DIAGNOSIS — F319 Bipolar disorder, unspecified: Secondary | ICD-10-CM

## 2023-12-20 ENCOUNTER — Ambulatory Visit: Admitting: Nurse Practitioner

## 2024-01-02 ENCOUNTER — Other Ambulatory Visit: Payer: Self-pay

## 2024-01-02 DIAGNOSIS — F341 Dysthymic disorder: Secondary | ICD-10-CM

## 2024-01-03 ENCOUNTER — Other Ambulatory Visit: Payer: Self-pay

## 2024-01-03 DIAGNOSIS — G894 Chronic pain syndrome: Secondary | ICD-10-CM

## 2024-01-03 DIAGNOSIS — M1712 Unilateral primary osteoarthritis, left knee: Secondary | ICD-10-CM

## 2024-01-03 DIAGNOSIS — M1611 Unilateral primary osteoarthritis, right hip: Secondary | ICD-10-CM

## 2024-01-03 DIAGNOSIS — M545 Low back pain, unspecified: Secondary | ICD-10-CM

## 2024-01-03 NOTE — Telephone Encounter (Signed)
 Copied from CRM #[id number]. Topic: Clinical - Medication Refill >> Jan 03, 2024  1:55 PM Sophia H wrote: Medication: HYDROcodone -acetaminophen  (NORCO) 10-325 MG tablet  Has the patient contacted their pharmacy? Yes, told to contact office   This is the patient's preferred pharmacy:  [id number] - [place], [place] - [address] [facility] [address] [place] [address] Phone: [phone] Fax: [phone]  Is this the correct pharmacy for this prescription? Yes If no, delete pharmacy and type the correct one.   Has the prescription been filled recently? Yes  Is the patient out of the medication? No, has enough to last till Thursday   Has the patient been seen for an appointment in the last year OR does the patient have an upcoming appointment? Yes, upcoming 08/18.   Can we respond through MyChart? No, Please call   Agent: Please be advised that Rx refills may take up to 3 business days. We ask that you follow-up with your pharmacy.

## 2024-01-04 MED ORDER — HYDROCODONE-ACETAMINOPHEN 10-325 MG PO TABS: 1.0000 | ORAL_TABLET | Freq: Four times a day (QID) | ORAL | 0 refills | Status: DC | PRN

## 2024-01-08 ENCOUNTER — Ambulatory Visit

## 2024-01-09 ENCOUNTER — Ambulatory Visit: Admitting: Nurse Practitioner

## 2024-01-16 ENCOUNTER — Other Ambulatory Visit: Payer: Self-pay

## 2024-01-16 DIAGNOSIS — F319 Bipolar disorder, unspecified: Secondary | ICD-10-CM

## 2024-01-17 ENCOUNTER — Other Ambulatory Visit: Payer: Self-pay

## 2024-01-17 ENCOUNTER — Telehealth: Payer: Self-pay

## 2024-01-17 ENCOUNTER — Ambulatory Visit: Payer: Self-pay

## 2024-01-17 DIAGNOSIS — M1611 Unilateral primary osteoarthritis, right hip: Secondary | ICD-10-CM

## 2024-01-17 DIAGNOSIS — M545 Low back pain, unspecified: Secondary | ICD-10-CM

## 2024-01-17 DIAGNOSIS — M1712 Unilateral primary osteoarthritis, left knee: Secondary | ICD-10-CM

## 2024-01-17 DIAGNOSIS — G259 Extrapyramidal and movement disorder, unspecified: Secondary | ICD-10-CM

## 2024-01-17 DIAGNOSIS — G894 Chronic pain syndrome: Secondary | ICD-10-CM

## 2024-01-17 DIAGNOSIS — F319 Bipolar disorder, unspecified: Secondary | ICD-10-CM

## 2024-01-17 DIAGNOSIS — F341 Dysthymic disorder: Secondary | ICD-10-CM

## 2024-01-17 MED ORDER — SERTRALINE HCL 100 MG PO TABS: 200.0000 mg | ORAL_TABLET | Freq: Every day | ORAL | 5 refills | Status: AC

## 2024-01-17 MED ORDER — DIVALPROEX SODIUM 500 MG PO DR TAB: 500.0000 mg | DELAYED_RELEASE_TABLET | Freq: Two times a day (BID) | ORAL | 5 refills | Status: AC

## 2024-01-17 MED ORDER — BENZTROPINE MESYLATE 0.5 MG PO TABS: 0.5000 mg | ORAL_TABLET | Freq: Two times a day (BID) | ORAL | 5 refills | Status: AC

## 2024-01-17 MED ORDER — HYDROCODONE-ACETAMINOPHEN 10-325 MG PO TABS
1.0000 | ORAL_TABLET | Freq: Four times a day (QID) | ORAL | 0 refills | Status: DC | PRN
Start: 2024-01-17 — End: 2024-02-27

## 2024-01-17 MED ORDER — TIZANIDINE HCL 4 MG PO TABS
4.0000 mg | ORAL_TABLET | Freq: Four times a day (QID) | ORAL | 5 refills | Status: AC | PRN
Start: 2024-01-17 — End: ?

## 2024-01-17 MED ORDER — BUSPIRONE HCL 10 MG PO TABS: 10.0000 mg | ORAL_TABLET | Freq: Three times a day (TID) | ORAL | 5 refills | Status: DC

## 2024-01-17 NOTE — Telephone Encounter (Signed)
 Patient needing refills on  divalproex  (DEPAKOTE ) 500 MG DR tablet [[id number]]   busPIRone  (BUSPAR ) 10 MG tablet [[id number]]   sertraline  (ZOLOFT ) 100 MG tablet [[id number]]   benztropine  (COGENTIN ) 0.5 MG tablet [[id number]]   HYDROcodone -acetaminophen  (NORCO) 10-325 MG tablet [[id number]]   Lisinopril  as well.  [facility] [address] Contact patient with any questions Thank you

## 2024-01-18 NOTE — Telephone Encounter (Signed)
 Noted

## 2024-01-27 ENCOUNTER — Other Ambulatory Visit: Payer: Self-pay

## 2024-01-27 DIAGNOSIS — F319 Bipolar disorder, unspecified: Secondary | ICD-10-CM

## 2024-02-10 DIAGNOSIS — J449 Chronic obstructive pulmonary disease, unspecified: Secondary | ICD-10-CM | POA: Diagnosis not present

## 2024-02-26 ENCOUNTER — Telehealth: Payer: Self-pay

## 2024-02-26 NOTE — Telephone Encounter (Signed)
 Copied from CRM #[id number]. Topic: Clinical - Prescription Issue >> Feb 23, 2024 12:06 PM Ivette P wrote: Reason for CRM: Pt daughter calling in about medication HYDROcodone -acetaminophen  (NORCO) 10-325 MG tablet  and would ike to know if it will be sent in this month or not.    Pls follow up with pt   [phone]

## 2024-02-27 ENCOUNTER — Telehealth: Payer: Self-pay

## 2024-02-27 ENCOUNTER — Other Ambulatory Visit: Payer: Self-pay

## 2024-02-27 DIAGNOSIS — M1611 Unilateral primary osteoarthritis, right hip: Secondary | ICD-10-CM

## 2024-02-27 DIAGNOSIS — M1712 Unilateral primary osteoarthritis, left knee: Secondary | ICD-10-CM

## 2024-02-27 DIAGNOSIS — G8929 Other chronic pain: Secondary | ICD-10-CM

## 2024-02-27 DIAGNOSIS — G894 Chronic pain syndrome: Secondary | ICD-10-CM

## 2024-02-27 MED ORDER — HYDROCODONE-ACETAMINOPHEN 10-325 MG PO TABS: 1.0000 | ORAL_TABLET | Freq: Four times a day (QID) | ORAL | 0 refills | Status: DC | PRN

## 2024-02-27 NOTE — Telephone Encounter (Unsigned)
 Copied from CRM [id number]. Topic: Clinical - Medication Refill >> Feb 27, 2024  9:56 AM Kendralyn S wrote: Medication: HYDROcodone -acetaminophen  (NORCO) 10-325 MG tablet  Has the patient contacted their pharmacy? Yes (Agent: If no, request that the patient contact the pharmacy for the refill. If patient does not wish to contact the pharmacy document the reason why and proceed with request.) (Agent: If yes, when and what did the pharmacy advise?)  This is the patient's preferred pharmacy:  [id number] - [place], [place] - [address] [facility] [address] [place] [address] Phone: [phone] Fax: [phone]  Is this the correct pharmacy for this prescription? Yes If no, delete pharmacy and type the correct one.   Has the prescription been filled recently? No  Is the patient out of the medication? No  Has the patient been seen for an appointment in the last year OR does the patient have an upcoming appointment? Yes  Can we respond through MyChart? No  Agent: Please be advised that Rx refills may take up to 3 business days. We ask that you follow-up with your pharmacy.

## 2024-02-27 NOTE — Telephone Encounter (Signed)
 Copied from CRM [id number]. Topic: Clinical - Medication Refill >> Feb 27, 2024  9:56 AM Kendralyn S wrote: Medication: HYDROcodone -acetaminophen  (NORCO) 10-325 MG tablet  Has the patient contacted their pharmacy? Yes (Agent: If no, request that the patient contact the pharmacy for the refill. If patient does not wish to contact the pharmacy document the reason why and proceed with request.) (Agent: If yes, when and what did the pharmacy advise?)  This is the patient's preferred pharmacy:  [id number] - [place], [place] - [address] AT [facility] [address] [place] [address] Phone: [phone] Fax: [phone]  Is this the correct pharmacy for this prescription? Yes If no, delete pharmacy and type the correct one.   Has the prescription been filled recently? No  Is the patient out of the medication? No  Has the patient been seen for an appointment in the last year OR does the patient have an upcoming appointment? Yes  Can we respond through MyChart? No  Agent: Please be advised that Rx refills may take up to 3 business days. We ask that you follow-up with your pharmacy.

## 2024-03-11 ENCOUNTER — Ambulatory Visit (HOSPITAL_COMMUNITY): Admitting: Registered Nurse

## 2024-03-11 DIAGNOSIS — J449 Chronic obstructive pulmonary disease, unspecified: Secondary | ICD-10-CM | POA: Diagnosis not present

## 2024-03-15 ENCOUNTER — Ambulatory Visit

## 2024-03-15 VITALS — BP 120/74 | HR 92 | Ht 65.0 in | Wt 178.0 lb

## 2024-03-15 DIAGNOSIS — L400 Psoriasis vulgaris: Secondary | ICD-10-CM

## 2024-03-15 DIAGNOSIS — Z23 Encounter for immunization: Secondary | ICD-10-CM | POA: Diagnosis not present

## 2024-03-15 DIAGNOSIS — G43C0 Periodic headache syndromes in child or adult, not intractable: Secondary | ICD-10-CM

## 2024-03-15 MED ORDER — OTEZLA 30 MG PO TABS: 1.0000 | ORAL_TABLET | Freq: Every day | ORAL | 5 refills | Status: DC

## 2024-03-15 NOTE — Progress Notes (Signed)
 Established Patient Office Visit  Subjective   Patient ID: [name] [name], female    DOB: August 28, 1955  Age: 68 y.o. MRN: [id number]  No chief complaint on file.   HPI Discussed the use of AI scribe software for clinical note transcription with the patient, who gave verbal consent to proceed.  History of Present Illness   [name] [name] is a 68 year old female with migraines and scalp psoriasis who presents for a four-week follow-up.  Migraine headaches - Severe migraine headaches persisting for several days - History of migraines since age 23 - Currently not on any specific migraine medication - Used Imitrex from her daughter, which provided relief - Feels another migraine is imminent - Pulsatile tinnitus (can hear heartbeat in her ears)  Scalp psoriasis - Worsening scalp psoriasis - Persistent pruritus of the scalp - Using prescription shampoo and clobetasol, which only dries hair without improving scalp condition - No prior use of oral medications for psoriasis - Psoriasis on body resolved spontaneously without topical therapy  Associated symptoms - Nausea and headache present - No fever or diarrhea  Immunization status - Has not received influenza vaccination this year      Patient Active Problem List   Diagnosis Date Noted   Plaque psoriasis 03/17/2024   SVT (supraventricular tachycardia) 10/09/2023   CAD (coronary artery disease) 09/06/2023   Mitral regurgitation and aortic stenosis 08/30/2023   Ascending aorta dilation 08/30/2023   Nicotine  use disorder 09/15/2022   AKI (acute kidney injury) 09/15/2022   Chronic anemia 09/15/2022   Mood disorder 09/15/2022   Hypermagnesemia 09/14/2022   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) 06/10/2022   Community acquired bacterial pneumonia 06/10/2022   Atrial flutter (HCC) 06/04/2022   Urinary retention 06/04/2022   Acute hypoxic respiratory failure (HCC) 06/03/2022   Osteoarthritis of right hip  03/09/2022   Pain in joint of right hip 03/09/2022   Pain in joint of left knee 03/09/2022   Palliative care by specialist    DNR (do not resuscitate)    Body mass index 30.0-30.9, adult 06/12/2018   Bipolar 1 disorder (HCC) 01/11/2018   Healthcare maintenance 05/10/2017   Arthritis of left knee 05/10/2017   Essential hypertension 04/27/2017   Other insomnia 04/27/2017   Chronic pain syndrome 04/27/2017   Recurrent major depressive disorder, in partial remission 04/27/2017   Chronic, continuous use of opioids 04/27/2017   Headache syndrome 04/27/2017   COPD (chronic obstructive pulmonary disease) (HCC) 12/27/2012   Chronic respiratory failure (HCC) 12/27/2012   History of colonic polyps 12/11/2012   Chronic diarrhea 12/11/2012   Benign neoplasm of colon 12/11/2012   Migraine 12/04/2012   Alcohol abuse 12/04/2012   Hyperlipidemia 07/08/2012   COPD exacerbation (HCC) 04/13/2012   Low back pain 04/13/2012   Type 2 diabetes mellitus (HCC) 04/13/2012   Tobacco abuse 04/13/2012   Acute-on-chronic respiratory failure (HCC) 04/13/2012   Depression    Suicidal thoughts       ROS    Objective:     BP 120/74   Pulse 92   Ht 5' 5 (1.651 m)   Wt 178 lb (80.7 kg)   SpO2 (!) 80%   BMI 29.62 kg/m  BP Readings from Last 3 Encounters:  03/15/24 120/74  12/01/23 108/71  11/06/23 130/80   Wt Readings from Last 3 Encounters:  03/15/24 178 lb (80.7 kg)  12/01/23 179 lb (81.2 kg)  11/06/23 195 lb 1.9 oz (88.5 kg)      Physical Exam Vitals and  nursing note reviewed.  Constitutional:      Appearance: Normal appearance.  HENT:     Head: Normocephalic.  Eyes:     Extraocular Movements: Extraocular movements intact.     Pupils: Pupils are equal, round, and reactive to light.  Cardiovascular:     Rate and Rhythm: Normal rate and regular rhythm.  Pulmonary:     Effort: Pulmonary effort is normal.     Breath sounds: Normal breath sounds.  Abdominal:     General: Bowel  sounds are normal.  Musculoskeletal:     Right hip: Tenderness present. Decreased range of motion.     Left hip: Normal.     Right knee: Normal.     Left knee: Swelling present. Decreased range of motion. Tenderness present.  Skin:    Findings: Rash present. Rash is crusting (scalp) and scaling (scalp).  Neurological:     Mental Status: She is alert and oriented to person, place, and time. Mental status is at baseline.     Gait: Gait abnormal (antalgic).  Psychiatric:        Mood and Affect: Mood normal.        Thought Content: Thought content normal.     No results found for any visits on 03/15/24.    The 10-year ASCVD risk score (Arnett DK, et al., 2019) is: 15%    Assessment & Plan:   Problem List Items Addressed This Visit       Cardiovascular and Mediastinum   Migraine   Chronic migraines.  Her daughter gave her an Imitrex which was effective for relief. - Provided samples of Ubrelvy : take one tablet initially, repeat in two hours if needed, max two tablets in 24 hours.       Relevant Medications   Apremilast (OTEZLA) 30 MG TABS     Musculoskeletal and Integument   Plaque psoriasis - Primary   Chronic scalp psoriasis unresponsive to clobetasol. Considering Otezla for treatment, no drug interactions noted. - Prescribed Otezla, verified insurance coverage. - Provided Otezla starter pack.      Relevant Medications   Apremilast (OTEZLA) 30 MG TABS   Other Visit Diagnoses       Need for influenza vaccination       Relevant Orders   Flu vaccine HIGH DOSE PF(Fluzone Trivalent)     Encounter for immunization       Relevant Orders   Flu vaccine HIGH DOSE PF(Fluzone Trivalent) (Completed)       No follow-ups on file.    Leita Longs, FNP

## 2024-03-17 DIAGNOSIS — L400 Psoriasis vulgaris: Secondary | ICD-10-CM | POA: Insufficient documentation

## 2024-03-17 NOTE — Assessment & Plan Note (Signed)
 Chronic scalp psoriasis unresponsive to clobetasol. Considering Otezla for treatment, no drug interactions noted. - Prescribed Otezla, verified insurance coverage. - Provided Otezla starter pack.

## 2024-03-17 NOTE — Assessment & Plan Note (Signed)
 Chronic migraines.  Her daughter gave her an Imitrex which was effective for relief. - Provided samples of Ubrelvy : take one tablet initially, repeat in two hours if needed, max two tablets in 24 hours.

## 2024-03-21 ENCOUNTER — Other Ambulatory Visit (HOSPITAL_COMMUNITY): Payer: Self-pay

## 2024-03-21 ENCOUNTER — Telehealth: Payer: Self-pay | Admitting: Pharmacy Technician

## 2024-03-21 ENCOUNTER — Telehealth: Payer: Self-pay

## 2024-03-21 NOTE — Telephone Encounter (Signed)
 PA request has been Started. New Encounter has been or will be created for follow up. For additional info see Pharmacy Prior Auth telephone encounter from 03/21/2024.

## 2024-03-21 NOTE — Telephone Encounter (Signed)
 Copied from CRM #[id number]. Topic: Clinical - Medication Prior Auth >> Mar 20, 2024  5:00 PM Nathanel BROCKS wrote: Reason for CRM: cmm key [id number]  For Prior authorization

## 2024-03-21 NOTE — Telephone Encounter (Signed)
 Pharmacy Patient Advocate Encounter   Received notification from CoverMyMeds that prior authorization for Otezla 30MG  tablets is required/requested.   Insurance verification completed.   The patient is insured through [employer].   Per test claim: PA required; PA submitted to above mentioned insurance via Latent Key/confirmation #/EOC [id number] Status is pending

## 2024-03-21 NOTE — Telephone Encounter (Signed)
 Pharmacy Patient Advocate Encounter  Received notification from [facility] that Prior Authorization for Otezla 30MG  tablets has been DENIED.  Full denial letter will be uploaded to the media tab. See denial reason below.   PA #/Case ID/Reference #: [id number]

## 2024-03-25 NOTE — Progress Notes (Signed)
 [name] [name]                                          MRN: [id number]   03/25/2024   The VBCI Quality Team Specialist reviewed this patient medical record for the purposes of chart review for care gap closure. The following were reviewed: chart review for care gap closure-kidney health evaluation for diabetes:eGFR  and uACR.    VBCI Quality Team

## 2024-03-26 ENCOUNTER — Inpatient Hospital Stay (HOSPITAL_COMMUNITY)
Admission: EM | Admit: 2024-03-26 | Discharge: 2024-04-03 | DRG: 177 | Disposition: A | Discharge status: Home / Self Care | Attending: Internal Medicine | Admitting: Internal Medicine

## 2024-03-26 ENCOUNTER — Emergency Department (HOSPITAL_COMMUNITY)

## 2024-03-26 DIAGNOSIS — I4891 Unspecified atrial fibrillation: Secondary | ICD-10-CM | POA: Diagnosis present

## 2024-03-26 DIAGNOSIS — I4892 Unspecified atrial flutter: Secondary | ICD-10-CM | POA: Diagnosis present

## 2024-03-26 DIAGNOSIS — J9621 Acute and chronic respiratory failure with hypoxia: Secondary | ICD-10-CM | POA: Diagnosis present

## 2024-03-26 DIAGNOSIS — Z825 Family history of asthma and other chronic lower respiratory diseases: Secondary | ICD-10-CM

## 2024-03-26 DIAGNOSIS — N179 Acute kidney failure, unspecified: Secondary | ICD-10-CM | POA: Diagnosis present

## 2024-03-26 DIAGNOSIS — F418 Other specified anxiety disorders: Secondary | ICD-10-CM | POA: Diagnosis not present

## 2024-03-26 DIAGNOSIS — Z7984 Long term (current) use of oral hypoglycemic drugs: Secondary | ICD-10-CM | POA: Diagnosis not present

## 2024-03-26 DIAGNOSIS — K859 Acute pancreatitis without necrosis or infection, unspecified: Secondary | ICD-10-CM | POA: Diagnosis present

## 2024-03-26 DIAGNOSIS — I482 Chronic atrial fibrillation, unspecified: Secondary | ICD-10-CM | POA: Diagnosis not present

## 2024-03-26 DIAGNOSIS — Z888 Allergy status to other drugs, medicaments and biological substances status: Secondary | ICD-10-CM

## 2024-03-26 DIAGNOSIS — Z9981 Dependence on supplemental oxygen: Secondary | ICD-10-CM | POA: Diagnosis not present

## 2024-03-26 DIAGNOSIS — E782 Mixed hyperlipidemia: Secondary | ICD-10-CM | POA: Diagnosis present

## 2024-03-26 DIAGNOSIS — G4700 Insomnia, unspecified: Secondary | ICD-10-CM | POA: Diagnosis present

## 2024-03-26 DIAGNOSIS — Z79899 Other long term (current) drug therapy: Secondary | ICD-10-CM

## 2024-03-26 DIAGNOSIS — I5033 Acute on chronic diastolic (congestive) heart failure: Secondary | ICD-10-CM

## 2024-03-26 DIAGNOSIS — Z6372 Alcoholism and drug addiction in family: Secondary | ICD-10-CM

## 2024-03-26 DIAGNOSIS — U071 COVID-19: Secondary | ICD-10-CM | POA: Diagnosis present

## 2024-03-26 DIAGNOSIS — I10 Essential (primary) hypertension: Secondary | ICD-10-CM

## 2024-03-26 DIAGNOSIS — J441 Chronic obstructive pulmonary disease with (acute) exacerbation: Secondary | ICD-10-CM | POA: Diagnosis present

## 2024-03-26 DIAGNOSIS — I5022 Chronic systolic (congestive) heart failure: Secondary | ICD-10-CM | POA: Diagnosis present

## 2024-03-26 DIAGNOSIS — F1021 Alcohol dependence, in remission: Secondary | ICD-10-CM | POA: Diagnosis present

## 2024-03-26 DIAGNOSIS — Z720 Tobacco use: Secondary | ICD-10-CM | POA: Diagnosis not present

## 2024-03-26 DIAGNOSIS — F17290 Nicotine dependence, other tobacco product, uncomplicated: Secondary | ICD-10-CM | POA: Diagnosis present

## 2024-03-26 DIAGNOSIS — F319 Bipolar disorder, unspecified: Secondary | ICD-10-CM | POA: Diagnosis present

## 2024-03-26 DIAGNOSIS — J440 Chronic obstructive pulmonary disease with acute lower respiratory infection: Secondary | ICD-10-CM | POA: Diagnosis present

## 2024-03-26 DIAGNOSIS — F419 Anxiety disorder, unspecified: Secondary | ICD-10-CM | POA: Diagnosis present

## 2024-03-26 DIAGNOSIS — Z881 Allergy status to other antibiotic agents status: Secondary | ICD-10-CM

## 2024-03-26 DIAGNOSIS — E119 Type 2 diabetes mellitus without complications: Secondary | ICD-10-CM | POA: Diagnosis present

## 2024-03-26 DIAGNOSIS — Z818 Family history of other mental and behavioral disorders: Secondary | ICD-10-CM

## 2024-03-26 DIAGNOSIS — Z820 Family history of epilepsy and other diseases of the nervous system: Secondary | ICD-10-CM

## 2024-03-26 DIAGNOSIS — Z811 Family history of alcohol abuse and dependence: Secondary | ICD-10-CM

## 2024-03-26 DIAGNOSIS — Z90710 Acquired absence of both cervix and uterus: Secondary | ICD-10-CM

## 2024-03-26 DIAGNOSIS — I2510 Atherosclerotic heart disease of native coronary artery without angina pectoris: Secondary | ICD-10-CM | POA: Diagnosis present

## 2024-03-26 DIAGNOSIS — I110 Hypertensive heart disease with heart failure: Secondary | ICD-10-CM | POA: Diagnosis present

## 2024-03-26 DIAGNOSIS — R0602 Shortness of breath: Secondary | ICD-10-CM | POA: Diagnosis not present

## 2024-03-26 DIAGNOSIS — Z7901 Long term (current) use of anticoagulants: Secondary | ICD-10-CM

## 2024-03-26 DIAGNOSIS — E039 Hypothyroidism, unspecified: Secondary | ICD-10-CM | POA: Diagnosis present

## 2024-03-26 DIAGNOSIS — J159 Unspecified bacterial pneumonia: Secondary | ICD-10-CM | POA: Diagnosis present

## 2024-03-26 DIAGNOSIS — Z880 Allergy status to penicillin: Secondary | ICD-10-CM

## 2024-03-26 LAB — COMPREHENSIVE METABOLIC PANEL WITH GFR
ALT: <5 U/L (ref 0–44)
AST: <10 U/L — ABNORMAL LOW (ref 15–41)
Albumin: 3.5 g/dL (ref 3.5–5.0)
Alkaline Phosphatase: 62 U/L (ref 38–126)
Anion gap: 10 (ref 5–15)
BUN: 47 mg/dL — ABNORMAL HIGH (ref 8–23)
CO2: 32 mmol/L (ref 22–32)
Calcium: 8.9 mg/dL (ref 8.9–10.3)
Chloride: 101 mmol/L (ref 98–111)
Creatinine, Ser: 1.53 mg/dL — ABNORMAL HIGH (ref 0.44–1.00)
GFR, Estimated: 37 mL/min — ABNORMAL LOW (ref >60)
Glucose, Bld: 101 mg/dL — ABNORMAL HIGH (ref 70–99)
Potassium: 4.4 mmol/L (ref 3.5–5.1)
Sodium: 143 mmol/L (ref 135–145)
Total Bilirubin: 0.3 mg/dL (ref 0.0–1.2)
Total Protein: 7.2 g/dL (ref 6.5–8.1)

## 2024-03-26 LAB — CBC WITH DIFFERENTIAL/PLATELET
Abs Immature Granulocytes: 0.18 K/uL — ABNORMAL HIGH (ref 0.00–0.07)
Basophils Absolute: 0 K/uL (ref 0.0–0.1)
Basophils Relative: 1 %
Eosinophils Absolute: 0.1 K/uL (ref 0.0–0.5)
Eosinophils Relative: 2 %
HCT: 31.9 % — ABNORMAL LOW (ref 36.0–46.0)
Hemoglobin: 9.8 g/dL — ABNORMAL LOW (ref 12.0–15.0)
Immature Granulocytes: 4 %
Lymphocytes Relative: 22 %
Lymphs Abs: 1 K/uL (ref 0.7–4.0)
MCH: 28.6 pg (ref 26.0–34.0)
MCHC: 30.7 g/dL (ref 30.0–36.0)
MCV: 93 fL (ref 80.0–100.0)
Monocytes Absolute: 0.4 K/uL (ref 0.1–1.0)
Monocytes Relative: 8 %
Neutro Abs: 2.8 K/uL (ref 1.7–7.7)
Neutrophils Relative %: 63 %
Platelets: 159 K/uL (ref 150–400)
RBC: 3.43 MIL/uL — ABNORMAL LOW (ref 3.87–5.11)
RDW: 12.7 % (ref 11.5–15.5)
WBC: 4.5 K/uL (ref 4.0–10.5)
nRBC: 0 % (ref 0.0–0.2)

## 2024-03-26 LAB — BLOOD GAS, VENOUS
Acid-Base Excess: 10 mmol/L — ABNORMAL HIGH (ref 0.0–2.0)
Bicarbonate: 34.9 mmol/L — ABNORMAL HIGH (ref 20.0–28.0)
Drawn by: 7049
O2 Saturation: 98.1 %
Patient temperature: 37.3
pCO2, Ven: 49 mmHg (ref 44–60)
pH, Ven: 7.46 — ABNORMAL HIGH (ref 7.25–7.43)
pO2, Ven: 68 mmHg — ABNORMAL HIGH (ref 32–45)

## 2024-03-26 LAB — RESP PANEL BY RT-PCR (RSV, FLU A&B, COVID)  RVPGX2
Influenza A by PCR: NEGATIVE
Influenza B by PCR: NEGATIVE
Resp Syncytial Virus by PCR: NEGATIVE
SARS Coronavirus 2 by RT PCR: POSITIVE — AB

## 2024-03-26 LAB — PRO BRAIN NATRIURETIC PEPTIDE: Pro Brain Natriuretic Peptide: 3885 pg/mL — ABNORMAL HIGH (ref <300.0)

## 2024-03-26 LAB — MAGNESIUM: Magnesium: 2.2 mg/dL (ref 1.7–2.4)

## 2024-03-26 MED ORDER — NICOTINE 21 MG/24HR TD PT24
21.0000 mg | MEDICATED_PATCH | Freq: Every day | TRANSDERMAL | Status: DC
  Administered 2024-03-27 – 2024-04-03 (×8): 21 mg via TRANSDERMAL
  Filled 2024-03-26 (×8): qty 1

## 2024-03-26 MED ORDER — SERTRALINE HCL 50 MG PO TABS
200.0000 mg | ORAL_TABLET | Freq: Every day | ORAL | Status: DC
  Administered 2024-03-27 – 2024-04-03 (×8): 200 mg via ORAL
  Filled 2024-03-26 (×8): qty 4

## 2024-03-26 MED ORDER — BUSPIRONE HCL 5 MG PO TABS
10.0000 mg | ORAL_TABLET | Freq: Three times a day (TID) | ORAL | Status: DC
  Administered 2024-03-26 – 2024-04-03 (×22): 10 mg via ORAL
  Filled 2024-03-26 (×21): qty 2

## 2024-03-26 MED ORDER — DM-GUAIFENESIN ER 30-600 MG PO TB12
1.0000 | ORAL_TABLET | Freq: Two times a day (BID) | ORAL | Status: DC
  Administered 2024-03-26 – 2024-04-03 (×16): 1 via ORAL
  Filled 2024-03-26 (×16): qty 1

## 2024-03-26 MED ORDER — IPRATROPIUM-ALBUTEROL 0.5-2.5 (3) MG/3ML IN SOLN: 3.0000 mL | RESPIRATORY_TRACT | Status: DC | PRN

## 2024-03-26 MED ORDER — SODIUM CHLORIDE 0.9 % IV SOLN
500.0000 mg | Freq: Once | INTRAVENOUS | Status: AC
  Administered 2024-03-26: 500 mg via INTRAVENOUS
  Filled 2024-03-26: qty 5

## 2024-03-26 MED ORDER — IPRATROPIUM-ALBUTEROL 0.5-2.5 (3) MG/3ML IN SOLN
3.0000 mL | Freq: Four times a day (QID) | RESPIRATORY_TRACT | Status: DC
  Administered 2024-03-27: 3 mL via RESPIRATORY_TRACT
  Filled 2024-03-26: qty 3

## 2024-03-26 MED ORDER — SODIUM CHLORIDE 0.9 % IV SOLN
1.0000 g | Freq: Once | INTRAVENOUS | Status: AC
  Administered 2024-03-26: 1 g via INTRAVENOUS
  Filled 2024-03-26: qty 10

## 2024-03-26 MED ORDER — DIVALPROEX SODIUM 250 MG PO DR TAB
500.0000 mg | DELAYED_RELEASE_TABLET | Freq: Two times a day (BID) | ORAL | Status: DC
  Administered 2024-03-26 – 2024-04-03 (×16): 500 mg via ORAL
  Filled 2024-03-26 (×16): qty 2

## 2024-03-26 MED ORDER — ALBUTEROL SULFATE (2.5 MG/3ML) 0.083% IN NEBU
2.5000 mg | INHALATION_SOLUTION | Freq: Once | RESPIRATORY_TRACT | Status: AC
  Administered 2024-03-26: 2.5 mg via RESPIRATORY_TRACT
  Filled 2024-03-26: qty 3

## 2024-03-26 MED ORDER — AZITHROMYCIN 250 MG PO TABS
250.0000 mg | ORAL_TABLET | Freq: Every day | ORAL | Status: DC
  Administered 2024-03-28 – 2024-03-30 (×3): 250 mg via ORAL
  Filled 2024-03-26 (×3): qty 1

## 2024-03-26 MED ORDER — APIXABAN 5 MG PO TABS
5.0000 mg | ORAL_TABLET | Freq: Two times a day (BID) | ORAL | Status: DC
  Administered 2024-03-26 – 2024-04-03 (×16): 5 mg via ORAL
  Filled 2024-03-26 (×16): qty 1

## 2024-03-26 MED ORDER — AZITHROMYCIN 250 MG PO TABS
500.0000 mg | ORAL_TABLET | Freq: Every day | ORAL | Status: AC
  Administered 2024-03-27: 500 mg via ORAL
  Filled 2024-03-26: qty 2

## 2024-03-26 MED ORDER — PANTOPRAZOLE SODIUM 40 MG PO TBEC
40.0000 mg | DELAYED_RELEASE_TABLET | Freq: Every day | ORAL | Status: DC
  Administered 2024-03-27 – 2024-04-03 (×8): 40 mg via ORAL
  Filled 2024-03-26 (×8): qty 1

## 2024-03-26 MED ORDER — HYDROCODONE-ACETAMINOPHEN 5-325 MG PO TABS
2.0000 | ORAL_TABLET | Freq: Once | ORAL | Status: AC
  Administered 2024-03-26: 2 via ORAL
  Filled 2024-03-26: qty 2

## 2024-03-26 MED ORDER — METHYLPREDNISOLONE SODIUM SUCC 40 MG IJ SOLR
40.0000 mg | Freq: Two times a day (BID) | INTRAMUSCULAR | Status: DC
  Administered 2024-03-26 – 2024-03-27 (×2): 40 mg via INTRAVENOUS
  Filled 2024-03-26 (×2): qty 1

## 2024-03-26 MED ORDER — ATORVASTATIN CALCIUM 20 MG PO TABS
20.0000 mg | ORAL_TABLET | Freq: Every day | ORAL | Status: DC
  Administered 2024-03-26 – 2024-04-02 (×8): 20 mg via ORAL
  Filled 2024-03-26 (×8): qty 1

## 2024-03-26 NOTE — ED Triage Notes (Signed)
 Pt arrived via [facility] from home c/o shob x last several days with a productive cough that is yellowish in color, per EMS, SpO2 was 86% on 2 L via [place], pt is on 2 L via [place] baseline,

## 2024-03-26 NOTE — ED Notes (Signed)
 Pt titrated up from 2 L of oxygen  via [place] to 3 L oxygen  via [place] as SpO2 was remaining 87% on 2 L. Spo2 now in the low 90s% on 3 L [place]

## 2024-03-26 NOTE — ED Notes (Signed)
 Hx of COPD.

## 2024-03-26 NOTE — ED Provider Notes (Signed)
 [place] EMERGENCY DEPARTMENT AT [facility] Provider Note   CSN: [id number] Arrival date & time: 03/26/24  1655     Patient presents with: Shortness of Breath   [name] [name] is a 68 y.o. female.   HPI Patient presents for shortness of breath.  Medical history includes HTN, HLD, CHF, atrial flutter, CAD, bipolar disorder, COPD, DM, migraines, alcohol abuse, arthritis, anemia.  For the past 2 weeks, she has had cough and chest congestion.  She wears 2 L of oxygen  at baseline.  Over the past week, due to worsening symptoms, she has stayed in the bed and not been eating or drinking due to generalized weakness.  When EMS arrived on scene today, she was 87% on her 2 L.  She was given Solu-Medrol  and nebulized breathing treatment prior to arrival.  Patient denies any current areas of pain.  She does report increased sputum production that is yellow in color.    Prior to Admission medications   Medication Sig Start Date End Date Taking? Authorizing Provider  albuterol  (VENTOLIN  HFA) 108 (90 Base) MCG/ACT inhaler Inhale 1-2 puffs into the lungs every 4 (four) hours as needed for shortness of breath.    [provider]  apixaban  (ELIQUIS ) 5 MG TABS tablet Take 1 tablet (5 mg total) by mouth 2 (two) times daily. 08/16/23   Lelon Hamilton T, PA-C  Apremilast (OTEZLA) 30 MG TABS Take 1 tablet (30 mg total) by mouth daily. 03/15/24   Bevely Doffing, FNP  atorvastatin  (LIPITOR) 20 MG tablet Take 20 mg by mouth at bedtime.    [provider]  benztropine  (COGENTIN ) 0.5 MG tablet Take 1 tablet (0.5 mg total) by mouth 2 (two) times daily. 01/17/24   Bevely Doffing, FNP  busPIRone  (BUSPAR ) 10 MG tablet Take 1 tablet (10 mg total) by mouth 3 (three) times daily. 01/17/24   Bevely Doffing, FNP  cephALEXin  (KEFLEX ) 500 MG capsule Take 500 mg by mouth 2 (two) times daily. 02/25/24   [provider]  Clobetasol Propionate 0.05 % shampoo Apply 1 Application topically at  bedtime.    [provider]  divalproex  (DEPAKOTE ) 500 MG DR tablet Take 1 tablet (500 mg total) by mouth 2 (two) times daily. 01/17/24   Bevely Doffing, FNP  furosemide  (LASIX ) 40 MG tablet Take 1 tablet (40 mg total) by mouth daily. 06/11/22   Paige, Victoria J, DO  HYDROcodone -acetaminophen  (NORCO) 10-325 MG tablet Take 1 tablet by mouth 4 (four) times daily as needed. 02/27/24   Bevely Doffing, FNP  lisinopril -hydrochlorothiazide  (ZESTORETIC) 10-12.5 MG tablet Take 1 tablet by mouth daily. 01/28/24   [provider]  metoprolol  tartrate (LOPRESSOR ) 25 MG tablet Take 1.5 tablets (37.5 mg total) by mouth 2 (two) times daily. 10/09/23   Lelon Hamilton T, PA-C  ondansetron  (ZOFRAN -ODT) 4 MG disintegrating tablet Take 1 tablet (4 mg total) by mouth every 8 (eight) hours as needed for vomiting. 07/22/23   Mesner, Selinda, MD  OXYGEN  Inhale 2 L into the lungs daily.    [provider]  QUEtiapine  (SEROQUEL ) 50 MG tablet TAKE 1/2 TABLET TWICE DAILY AND 1 TABLET AT BEDTIME 01/16/24   Bevely Doffing, FNP  sertraline  (ZOLOFT ) 100 MG tablet Take 2 tablets (200 mg total) by mouth daily. 01/17/24   Bevely Doffing, FNP  tiZANidine  (ZANAFLEX ) 4 MG tablet Take 1 tablet (4 mg total) by mouth every 6 (six) hours as needed for muscle spasms. 01/17/24   Bevely Doffing, FNP  TRELEGY ELLIPTA  100-62.5-25 MCG/ACT AEPB  Inhale 1 puff into the lungs daily. 08/03/23   [provider]    Allergies: Bactrim [sulfamethoxazole -trimethoprim], Penicillins, Amoxicillin , and Diclofenac    Review of Systems  Constitutional:  Positive for activity change, appetite change and fatigue.  HENT:  Positive for congestion.   Respiratory:  Positive for cough, chest tightness and shortness of breath.   Neurological:  Positive for weakness (Generalized).  All other systems reviewed and are negative.   Updated Vital Signs BP 107/65   Pulse 74   Temp 99.2 F (37.3 C) (Oral)   Resp 20   SpO2 90%   Physical  Exam Vitals and nursing note reviewed.  Constitutional:      General: She is not in acute distress.    Appearance: Normal appearance. She is well-developed. She is not ill-appearing, toxic-appearing or diaphoretic.  HENT:     Head: Normocephalic and atraumatic.     Right Ear: External ear normal.     Left Ear: External ear normal.     Nose: Nose normal.     Mouth/Throat:     Mouth: Mucous membranes are moist.  Eyes:     Extraocular Movements: Extraocular movements intact.     Conjunctiva/sclera: Conjunctivae normal.  Cardiovascular:     Rate and Rhythm: Normal rate and regular rhythm.     Heart sounds: No murmur heard. Pulmonary:     Effort: Pulmonary effort is normal. No respiratory distress.     Breath sounds: Wheezing and rhonchi present.  Abdominal:     General: There is no distension.     Palpations: Abdomen is soft.     Tenderness: There is no abdominal tenderness.  Musculoskeletal:        General: No swelling. Normal range of motion.     Cervical back: Normal range of motion and neck supple.  Skin:    General: Skin is warm and dry.     Coloration: Skin is not jaundiced or pale.  Neurological:     General: No focal deficit present.     Mental Status: She is alert and oriented to person, place, and time.  Psychiatric:        Mood and Affect: Mood normal.        Behavior: Behavior normal.     (all labs ordered are listed, but only abnormal results are displayed) Labs Reviewed  RESP PANEL BY RT-PCR (RSV, FLU A&B, COVID)  RVPGX2 - Abnormal; Notable for the following components:      Result Value   SARS Coronavirus 2 by RT PCR POSITIVE (*)    All other components within normal limits  COMPREHENSIVE METABOLIC PANEL WITH GFR - Abnormal; Notable for the following components:   Glucose, Bld 101 (*)    BUN 47 (*)    Creatinine, Ser 1.53 (*)    AST <10 (*)    GFR, Estimated 37 (*)    All other components within normal limits  PRO BRAIN NATRIURETIC PEPTIDE - Abnormal;  Notable for the following components:   Pro Brain Natriuretic Peptide 3,885.0 (*)    All other components within normal limits  BLOOD GAS, VENOUS - Abnormal; Notable for the following components:   pH, Ven 7.46 (*)    pO2, Ven 68 (*)    Bicarbonate 34.9 (*)    Acid-Base Excess 10.0 (*)    All other components within normal limits  CBC WITH DIFFERENTIAL/PLATELET - Abnormal; Notable for the following components:   RBC 3.43 (*)    Hemoglobin 9.8 (*)  HCT 31.9 (*)    Abs Immature Granulocytes 0.18 (*)    All other components within normal limits  MAGNESIUM     EKG: None  Radiology: [facility] Chest Port 1 View Result Date: 03/26/2024 CLINICAL DATA:  Shortness of breath and productive cough. EXAM: PORTABLE CHEST 1 VIEW COMPARISON:  July 21, 2023 FINDINGS: The cardiac silhouette is mildly enlarged and unchanged in size. Low lung volumes are noted. There is prominence of the pulmonary vasculature with mildly increased interstitial lung markings. Mild atelectatic changes are seen within the bilateral lung bases. No pleural effusion or pneumothorax is identified. A chronic seventh left rib fracture is seen. Multilevel degenerative changes are present throughout the thoracic spine. IMPRESSION: 1. Low lung volumes with mild bibasilar atelectasis. 2. Mild cardiomegaly with mild pulmonary vascular congestion. A mild component of associated interstitial edema cannot be excluded. Electronically Signed   By: Suzen Dials M.D.   On: 03/26/2024 17:49     Procedures   Medications Ordered in the ED  azithromycin  (ZITHROMAX ) 500 mg in sodium chloride  0.9 % 250 mL IVPB (500 mg Intravenous New Bag/Given 03/26/24 1833)  albuterol  (PROVENTIL ) (2.5 MG/3ML) 0.083% nebulizer solution 2.5 mg (2.5 mg Nebulization Given 03/26/24 1834)  cefTRIAXone  (ROCEPHIN ) 1 g in sodium chloride  0.9 % 100 mL IVPB (0 g Intravenous Stopped 03/26/24 1820)                                    Medical Decision Making Amount and/or  Complexity of Data Reviewed Labs: ordered. Radiology: ordered.  Risk Prescription drug management.   This patient presents to the ED for concern of cough and shortness of breath, this involves an extensive number of treatment options, and is a complaint that carries with it a high risk of complications and morbidity.  The differential diagnosis includes COPD exacerbation, pneumonia, CHF, pleural effusion, neoplasm URI   Co morbidities / Chronic conditions that complicate the patient evaluation  HTN, HLD, CHF, atrial flutter, CAD, bipolar disorder, COPD, DM, migraines, alcohol abuse, arthritis, anemia   Additional history obtained:  Additional history obtained from EMR External records from outside source obtained and reviewed including EMS   Lab Tests:  I Ordered, and personally interpreted labs.  The pertinent results include: Slight anemia, no leukocytosis, normal electrolytes.  AKI is present.  Patient tested positive for COVID-19.  BNP is elevated.   Imaging Studies ordered:  I ordered imaging studies including chest x-ray I independently visualized and interpreted imaging which showed vascular congestion with mild interstitial edema I agree with the radiologist interpretation   Cardiac Monitoring: / EKG:  The patient was maintained on a cardiac monitor.  I personally viewed and interpreted the cardiac monitored which showed an underlying rhythm of: Sinus rhythm   Problem List / ED Course / Critical interventions / Medication management  Patient presenting for worsening shortness of breath, cough, chest congestion over the past 2 weeks.  EMS noted hypoxia on her baseline 2 L of oxygen  on scene.  She was given Solu-Medrol  and nebulized breathing treatment prior to arrival.  On arrival, patient is able to speak in complete sentences.  On lung auscultation, she continues to have expiratory wheezing.  She does report increased sputum production that is yellow in color.   Additional breathing treatment and antibiotics were ordered.  Workup was initiated.  Patient's lab work notable for AKI, elevated BNP, and positivity of COVID-19.  X-ray shows mild  interstitial edema.  On reassessment, patient resting comfortably.  She is currently on 3 L of supplemental oxygen  with SpO2 at 90%.  Patient was admitted for further management. I ordered medication including albuterol  for COPD; ceftriaxone  and azithromycin  for increased sputum production Reevaluation of the patient after these medicines showed that the patient improved I have reviewed the patients home medicines and have made adjustments as needed  Social Determinants of Health:  Has PCP  CRITICAL CARE Performed by: Bernardino Fireman   Total critical care time: 32 minutes  Critical care time was exclusive of separately billable procedures and treating other patients.  Critical care was necessary to treat or prevent imminent or life-threatening deterioration.  Critical care was time spent personally by me on the following activities: development of treatment plan with patient and/or surrogate as well as nursing, discussions with consultants, evaluation of patient's response to treatment, examination of patient, obtaining history from patient or surrogate, ordering and performing treatments and interventions, ordering and review of laboratory studies, ordering and review of radiographic studies, pulse oximetry and re-evaluation of patient's condition.      Final diagnoses:  Acute on chronic respiratory failure with hypoxia (HCC)  COPD exacerbation (HCC)  COVID-19  AKI (acute kidney injury)    ED Discharge Orders     None          Fireman Bernardino, MD 03/26/24 1845

## 2024-03-26 NOTE — ED Notes (Signed)
 Pt resting. Daughter at bedside. Pt c/o chronic pain to knees and hip rating 8. Pt slightly shaky. Daughter states pt takes hydrocodone  at home and hasn't had any. EDP aware and orders received. Pt denies shob. No resp distress or shob observed. Pt currently on 2L [place] and chronic at home.

## 2024-03-26 NOTE — H&P (Addendum)
 History and Physical    Patient: [name] [name] FMW:[id number] DOB: 1955-07-02 DOA: 03/26/2024 DOS: the patient was seen and examined on 03/26/2024 PCP: Bevely Doffing, FNP  Patient coming from: Home  Chief Complaint:  Chief Complaint  Patient presents with   Shortness of Breath   HPI: [name] [name] is a 68 y.o. female with medical history significant of hypertension, hyperlipidemia, hypothyroidism, chronic respiratory failure on 2 LPM of oxygen , COPD, atrial flutter/fibrillation, bipolar disorder presents to the emergency department due to 1 week onset of generalized weakness, she complained of productive cough of thick yellow phlegm, chest congestion, leg buckling, poor oral intake, fever and that she has been staying in bed most of the time.  She states that she ran out of oxygen  and was without her oxygen  for about 2 days during which she developed  worsening shortness of breath and she activated EMS today, on arrival of EMS team, she was noted to Have an O2 sat of 87%, Solu-Medrol  was given, breathing treatment was provided.  She denies chest pain, nausea, vomiting. She obtain flu vaccine after she already started to have flulike symptoms.  ED course Patient was hemodynamically stable, though BP was soft at 107/65.  Workup in the ED showed normocytic anemia, BMP was normal except for blood glucose of 101, BUN/creatinine 47/1.53 (baseline creatinine at 0.7-1.0).  SARS coronavirus was positive. Chest x-ray showed  Low lung volumes with mild bibasilar atelectasis. Mild cardiomegaly with mild pulmonary vascular congestion. A mild component of associated interstitial edema cannot be excluded. She was treated with IV ceftriaxone  and azithromycin  due to presumed CAP, present treatment with albuterol  was provided and Norco was given.    Review of Systems: As mentioned in the history of present illness. All other systems reviewed and are negative. 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/30/2023   TTE 08/30/2023:  ascending aorta 38 mm    Atrial fibrillation with RVR (HCC) 02/05/2020   Atrial flutter (HCC) 06/04/2022   Bipolar 1 disorder (HCC)    CAD (coronary artery disease) 09/06/2023   CCTA 09/05/23: CAC score 8 (54th percentile), mild nonobstructive CAD [Dx min noncalc plaque; mLCx 1-24 (mixed)]; TPV 70 mm3 (30th percentile) - Ca2+ 5 mm3 / nonCa2+ 65 mm3 - TPV is mild; small PFO (L-R shunt)    Cancer (HCC)    colon   Cellulitis of right lower leg    Chronic diarrhea    Followed by Dr Dianna    Chronic pain 06/05/2022   Community acquired bacterial pneumonia 06/10/2022   Complication of anesthesia    Hx resp distress during surgery   COPD (chronic obstructive pulmonary disease) (HCC)    COPD exacerbation (HCC) 04/13/2012   Depression    Diabetes mellitus (HCC)    External hemorrhoids    Gangrene of toe of right foot (HCC)    GI bleed    Headache(784.0)    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) 06/10/2022   Hyperlipidemia    Hypertension    Hypothyroidism    Insomnia    Internal hemorrhoids    Mitral regurgitation and aortic stenosis 08/30/2023   TTE 08/30/2023: EF 60-65, no RWMA, GR 1 DD, normal RVSF, severe LAE, mild-moderate RAE, moderate MR, mild AV calcification, trivial AI, mild aortic stenosis (mean gradient 13.5, V-max 240  cm/s, DI 0.57), ascending aorta 38 mm    Necrotizing fasciitis of lower leg (HCC)    Non-insulin  dependent type 2 diabetes mellitus (HCC) 04/13/2012   Polysubstance abuse (HCC) Quit 2008    H/O methamphetamine and alcohol abuse, clean x 6 years   Sepsis (HCC) 01/26/2020   Shortness of breath    Suicidal thoughts    SVT (supraventricular tachycardia) 10/09/2023   Monitor 08/2023: Rare PACs; 59 runs of SVT  (longest 15.2 seconds)    Tobacco abuse    Tubular adenoma of colon    Urinary retention 06/04/2022   Past Surgical History:  Procedure Laterality Date   ABDOMINAL HYSTERECTOMY     APPENDECTOMY     BUBBLE STUDY  06/10/2022   Procedure: BUBBLE STUDY;  Surgeon: Loni Soyla LABOR, MD;  Location: [facility] ENDOSCOPY;  Service: Cardiovascular;;   CARDIOVERSION N/A 06/10/2022   Procedure: CARDIOVERSION;  Surgeon: Loni Soyla LABOR, MD;  Location: [facility] ENDOSCOPY;  Service: Cardiovascular;  Laterality: N/A;   COLON SURGERY     colon repair   COLONOSCOPY WITH PROPOFOL  N/A 12/11/2012   Procedure: COLONOSCOPY WITH PROPOFOL ;  Surgeon: Gordy CHRISTELLA Starch, MD;  Location: [facility] ENDOSCOPY;  Service: Gastroenterology;  Laterality: N/A;   FINGER SURGERY     middle left finger   HERNIA REPAIR     x 2   KNEE SURGERY Left    Left knee tendon surgery   TEE WITHOUT CARDIOVERSION N/A 06/10/2022   Procedure: TRANSESOPHAGEAL ECHOCARDIOGRAM (TEE);  Surgeon: Loni Soyla LABOR, MD;  Location: [facility] ENDOSCOPY;  Service: Cardiovascular;  Laterality: N/A;   TONSILLECTOMY     TUBAL LIGATION     WOUND DEBRIDEMENT Right 02/06/2020   Procedure: SKIN BIOPSY WITH DEBRIDEMENT RIGHT LOWER LEG;  Surgeon: Kallie Manuelita BROCKS, MD;  Location: [facility] ORS;  Service: General;  Laterality: Right;   Social History:  reports that she has quit smoking. Her smoking use included cigarettes. She has a 42 pack-year smoking history. She quit smokeless tobacco use about 4 years ago. She reports that she does not currently use alcohol. She reports that she does not use drugs.  Allergies  Allergen Reactions   Bactrim [Sulfamethoxazole -Trimethoprim] Anaphylaxis   Penicillins Shortness Of Breath   Amoxicillin  Hives   Diclofenac Hives and Tinitus    Swelling in feet *Analgesics - Anti-Inflammatory*    Family History  Problem Relation Age of Onset   Multiple sclerosis Mother    Alcoholism Father    Depression Brother    Emphysema Maternal Grandfather         smoked   Breast cancer Neg Hx    Colon cancer Neg Hx    Esophageal cancer Neg Hx    Colon polyps Neg Hx    Stomach cancer Neg Hx    Pancreatic cancer Neg Hx    Liver disease Neg Hx     Prior to Admission medications   Medication Sig Start Date End Date Taking? Authorizing Provider  albuterol  (VENTOLIN  HFA) 108 (90 Base) MCG/ACT inhaler Inhale 1-2 puffs into the lungs every 4 (four) hours as needed for shortness of breath.    [provider]  apixaban  (ELIQUIS ) 5 MG TABS tablet Take 1 tablet (5 mg total) by mouth 2 (two) times daily. 08/16/23   Lelon Hamilton T, PA-C  Apremilast (OTEZLA) 30 MG TABS Take 1 tablet (30 mg total) by mouth daily. 03/15/24   Bevely Doffing, FNP  atorvastatin  (LIPITOR) 20 MG tablet Take 20 mg by mouth at bedtime.    [provider]  benztropine  (COGENTIN ) 0.5 MG tablet Take 1 tablet (0.5 mg total) by mouth 2 (two) times daily. 01/17/24   Bevely Doffing, FNP  busPIRone  (BUSPAR ) 10 MG tablet Take 1 tablet (10 mg total) by mouth 3 (three) times daily. 01/17/24   Bevely Doffing, FNP  cephALEXin  (KEFLEX ) 500 MG capsule Take 500 mg by mouth 2 (two) times daily. 02/25/24   [provider]  Clobetasol Propionate 0.05 % shampoo Apply 1 Application topically at bedtime.    [provider]  divalproex  (DEPAKOTE ) 500 MG DR tablet Take 1 tablet (500 mg total) by mouth 2 (two) times daily. 01/17/24   Bevely Doffing, FNP  furosemide  (LASIX ) 40 MG tablet Take 1 tablet (40 mg total) by mouth daily. 06/11/22   Paige, Victoria J, DO  HYDROcodone -acetaminophen  (NORCO) 10-325 MG tablet Take 1 tablet by mouth 4 (four) times daily as needed. 02/27/24   Bevely Doffing, FNP  lisinopril -hydrochlorothiazide  (ZESTORETIC) 10-12.5 MG tablet Take 1 tablet by mouth daily. 01/28/24   [provider]  metoprolol  tartrate (LOPRESSOR ) 25 MG tablet Take 1.5 tablets (37.5 mg total) by mouth 2 (two) times daily. 10/09/23   Lelon Hamilton T, PA-C  ondansetron   (ZOFRAN -ODT) 4 MG disintegrating tablet Take 1 tablet (4 mg total) by mouth every 8 (eight) hours as needed for vomiting. 07/22/23   Mesner, Selinda, MD  OXYGEN  Inhale 2 L into the lungs daily.    [provider]  QUEtiapine  (SEROQUEL ) 50 MG tablet TAKE 1/2 TABLET TWICE DAILY AND 1 TABLET AT BEDTIME 01/16/24   Bevely Doffing, FNP  sertraline  (ZOLOFT ) 100 MG tablet Take 2 tablets (200 mg total) by mouth daily. 01/17/24   Bevely Doffing, FNP  tiZANidine  (ZANAFLEX ) 4 MG tablet Take 1 tablet (4 mg total) by mouth every 6 (six) hours as needed for muscle spasms. 01/17/24   Bevely Doffing, FNP  TRELEGY ELLIPTA  100-62.5-25 MCG/ACT AEPB Inhale 1 puff into the lungs daily. 08/03/23   [provider]    Physical Exam: Vitals:   03/26/24 1703 03/26/24 1715 03/26/24 1753  BP: 117/60 107/65   Pulse: 84 78 74  Resp: (!) 22  20  Temp: 99.2 F (37.3 C)    TempSrc: Oral    SpO2: 90% (!) 89% 90%   General: Ill appearing, awake and alert and oriented x3. Not in any acute distress.  HEENT: NCAT.  PERRLA. EOMI. Sclerae anicteric.  Moist mucosal membranes. Neck: Neck supple without lymphadenopathy. No carotid bruits. No masses palpated.  Cardiovascular: Regular rate with normal S1-S2 sounds. No murmurs, rubs or gallops auscultated. No JVD.  Respiratory: Diffuse expiratory wheezing.  No accessory muscle use. Abdomen: Soft, nontender, nondistended. Active bowel sounds. No masses or hepatosplenomegaly  Skin: No rashes, lesions, or ulcerations.  Dry, warm to touch. Musculoskeletal:  2+ dorsalis pedis and radial pulses. Good ROM.  No contractures  Psychiatric: Intact judgment and insight.  Mood appropriate to current condition. Neurologic: No focal neurological deficits. Strength is 5/5 x 4.  CN II - XII grossly intact.  Data Reviewed: Normal sinus rhythm at rate of 76 bpm  Assessment and Plan: Acute on chronic respiratory failure with hypoxia possibly secondary to COVID-19 virus infection Acute  exacerbation of COPD Continue duo nebs, Mucinex , Solu-Medrol , azithromycin . Continue Protonix  to prevent steroid-induced ulcer Continue incentive spirometry and flutter valve Continue supplemental oxygen  to maintain O2 sat > 92% with plan to wean patient off oxygen  as tolerated  Elevated proBNP, rule out acute on chronic diastolic CHF proBNP 3,885 Continue  total input/output, daily weights and fluid restriction Lasix  temporarily held at this time due to soft BP Continue heart healthy diet  Echocardiogram done on 08/30/2023 showed LVEF of 60 to 65%.  No RWMA.  G1 DD.  Echocardiogram in the morning   Acute kidney injury BUN/creatinine 47/1.53 (baseline creatinine at 0.7-1.0) Attempt gentle hydration based on echo findings in the morning Renally adjust medications, avoid nephrotoxic agents/dehydration/hypotension  Presumed CAP POA Patient was empirically started on IV ceftriaxone  and azithromycin  Chest x-ray was more revealing for pulmonary vascular congestion and interstitial edema Patient was known to be positive for COVID infection Procalcitonin will be checked prior to continuing with antibiotics  Chronic atrial flutter/fibrillation Continue Eliquis . Temporarily hold Lopressor  due to soft BP   Bipolar disorder Anxiety with depression Continue home Zoloft , Depakote  and BuSpar .  Nicotine  use Continue nicotine  patch  Essential hypertension Hold BP meds at this time due to soft BP  Mixed hyperlipidemia Continue Lipitor   Advance Care Planning: Full code  Consults: None  Family Communication: None at bedside  Severity of Illness: The appropriate patient status for this patient is INPATIENT. Inpatient status is judged to be reasonable and necessary in order to provide the required intensity of service to ensure the patient's safety. The patient's presenting symptoms, physical exam findings, and initial radiographic and laboratory data in the context of their chronic  comorbidities is felt to place them at high risk for further clinical deterioration. Furthermore, it is not anticipated that the patient will be medically stable for discharge from the hospital within 2 midnights of admission.   * I certify that at the point of admission it is my clinical judgment that the patient will require inpatient hospital care spanning beyond 2 midnights from the point of admission due to high intensity of service, high risk for further deterioration and high frequency of surveillance required.*  Author: Randel Hargens, DO 03/26/2024 7:47 PM  For on call review [email or web address].

## 2024-03-27 ENCOUNTER — Telehealth: Payer: Self-pay

## 2024-03-27 ENCOUNTER — Other Ambulatory Visit: Payer: Self-pay

## 2024-03-27 DIAGNOSIS — L400 Psoriasis vulgaris: Secondary | ICD-10-CM

## 2024-03-27 DIAGNOSIS — J9621 Acute and chronic respiratory failure with hypoxia: Secondary | ICD-10-CM | POA: Diagnosis not present

## 2024-03-27 LAB — COMPREHENSIVE METABOLIC PANEL WITH GFR
ALT: 5 U/L (ref 0–44)
AST: <10 U/L — ABNORMAL LOW (ref 15–41)
Albumin: 3.4 g/dL — ABNORMAL LOW (ref 3.5–5.0)
Alkaline Phosphatase: 62 U/L (ref 38–126)
Anion gap: 11 (ref 5–15)
BUN: 50 mg/dL — ABNORMAL HIGH (ref 8–23)
CO2: 31 mmol/L (ref 22–32)
Calcium: 8.9 mg/dL (ref 8.9–10.3)
Chloride: 101 mmol/L (ref 98–111)
Creatinine, Ser: 1.2 mg/dL — ABNORMAL HIGH (ref 0.44–1.00)
GFR, Estimated: 49 mL/min — ABNORMAL LOW
Glucose, Bld: 152 mg/dL — ABNORMAL HIGH (ref 70–99)
Potassium: 4.6 mmol/L (ref 3.5–5.1)
Sodium: 142 mmol/L (ref 135–145)
Total Bilirubin: <0.2 mg/dL (ref 0.0–1.2)
Total Protein: 7.2 g/dL (ref 6.5–8.1)

## 2024-03-27 LAB — CBC
HCT: 30.8 % — ABNORMAL LOW (ref 36.0–46.0)
Hemoglobin: 9.5 g/dL — ABNORMAL LOW (ref 12.0–15.0)
MCH: 28.3 pg (ref 26.0–34.0)
MCHC: 30.8 g/dL (ref 30.0–36.0)
MCV: 91.7 fL (ref 80.0–100.0)
Platelets: 143 K/uL — ABNORMAL LOW (ref 150–400)
RBC: 3.36 MIL/uL — ABNORMAL LOW (ref 3.87–5.11)
RDW: 12.6 % (ref 11.5–15.5)
WBC: 3.4 K/uL — ABNORMAL LOW (ref 4.0–10.5)
nRBC: 0 % (ref 0.0–0.2)

## 2024-03-27 LAB — PROCALCITONIN: Procalcitonin: <0.1 ng/mL

## 2024-03-27 LAB — PHOSPHORUS: Phosphorus: 4.3 mg/dL (ref 2.5–4.6)

## 2024-03-27 LAB — MAGNESIUM: Magnesium: 2.3 mg/dL (ref 1.7–2.4)

## 2024-03-27 LAB — HIV ANTIBODY (ROUTINE TESTING W REFLEX): HIV Screen 4th Generation wRfx: NONREACTIVE

## 2024-03-27 MED ORDER — SENNOSIDES-DOCUSATE SODIUM 8.6-50 MG PO TABS: 2.0000 | ORAL_TABLET | Freq: Once | ORAL | Status: DC

## 2024-03-27 MED ORDER — DOCUSATE SODIUM 100 MG PO CAPS
100.0000 mg | ORAL_CAPSULE | Freq: Two times a day (BID) | ORAL | Status: DC | PRN
  Administered 2024-03-28: 100 mg via ORAL
  Filled 2024-03-27: qty 1

## 2024-03-27 MED ORDER — PREDNISONE 20 MG PO TABS
40.0000 mg | ORAL_TABLET | Freq: Every day | ORAL | Status: DC
  Administered 2024-03-28 – 2024-03-31 (×4): 40 mg via ORAL
  Filled 2024-03-27 (×5): qty 2

## 2024-03-27 MED ORDER — MORPHINE SULFATE (PF) 2 MG/ML IV SOLN
2.0000 mg | Freq: Once | INTRAVENOUS | Status: AC
Start: 2024-03-27 — End: 2024-03-27
  Administered 2024-03-27: 2 mg via INTRAVENOUS
  Filled 2024-03-27: qty 1

## 2024-03-27 MED ORDER — MELATONIN 3 MG PO TABS
6.0000 mg | ORAL_TABLET | Freq: Once | ORAL | Status: AC
  Administered 2024-03-27: 6 mg via ORAL
  Filled 2024-03-27: qty 2

## 2024-03-27 MED ORDER — ONDANSETRON HCL 4 MG PO TABS
4.0000 mg | ORAL_TABLET | Freq: Four times a day (QID) | ORAL | Status: DC | PRN
Start: 2024-03-27 — End: 2024-04-03

## 2024-03-27 MED ORDER — ENSURE PLUS HIGH PROTEIN PO LIQD
237.0000 mL | Freq: Two times a day (BID) | ORAL | Status: DC
  Administered 2024-03-27 – 2024-04-03 (×9): 237 mL via ORAL

## 2024-03-27 MED ORDER — HYDROCODONE-ACETAMINOPHEN 5-325 MG PO TABS
1.0000 | ORAL_TABLET | Freq: Four times a day (QID) | ORAL | Status: DC | PRN
  Administered 2024-03-27 – 2024-04-01 (×14): 1 via ORAL
  Filled 2024-03-27 (×14): qty 1

## 2024-03-27 MED ORDER — SIMETHICONE 80 MG PO CHEW
80.0000 mg | CHEWABLE_TABLET | Freq: Four times a day (QID) | ORAL | Status: DC | PRN
Start: 2024-03-27 — End: 2024-04-03
  Administered 2024-03-28 – 2024-03-31 (×4): 80 mg via ORAL
  Filled 2024-03-27 (×4): qty 1

## 2024-03-27 MED ORDER — IPRATROPIUM-ALBUTEROL 0.5-2.5 (3) MG/3ML IN SOLN
3.0000 mL | Freq: Three times a day (TID) | RESPIRATORY_TRACT | Status: DC
  Administered 2024-03-27 – 2024-03-29 (×5): 3 mL via RESPIRATORY_TRACT
  Filled 2024-03-27 (×7): qty 3

## 2024-03-27 MED ORDER — ADULT MULTIVITAMIN W/MINERALS CH
1.0000 | ORAL_TABLET | Freq: Every day | ORAL | Status: DC
  Administered 2024-03-27 – 2024-04-03 (×8): 1 via ORAL
  Filled 2024-03-27 (×8): qty 1

## 2024-03-27 MED ORDER — ONDANSETRON HCL 4 MG/2ML IJ SOLN
4.0000 mg | Freq: Four times a day (QID) | INTRAMUSCULAR | Status: DC | PRN
  Administered 2024-03-27 – 2024-03-28 (×3): 4 mg via INTRAVENOUS
  Filled 2024-03-27 (×3): qty 2

## 2024-03-27 MED ORDER — ACETAMINOPHEN 650 MG RE SUPP: 650.0000 mg | Freq: Four times a day (QID) | RECTAL | Status: DC | PRN

## 2024-03-27 MED ORDER — ACETAMINOPHEN 325 MG PO TABS
650.0000 mg | ORAL_TABLET | Freq: Four times a day (QID) | ORAL | Status: DC | PRN
  Administered 2024-03-28 – 2024-04-03 (×4): 650 mg via ORAL
  Filled 2024-03-27 (×4): qty 2

## 2024-03-27 NOTE — Discharge Instructions (Addendum)

## 2024-03-27 NOTE — Telephone Encounter (Signed)
 Pt advised stated she in currently in the hospital with Covid and Pneumonia

## 2024-03-27 NOTE — Telephone Encounter (Signed)
 Copied from CRM [id number]. Topic: Clinical - Prescription Issue >> Mar 26, 2024  4:54 PM Selinda [name] wrote: Reason for CRM: [name] [name] with [facility] called stating the prior authorization for the patients Apremilast (OTEZLA) 30 MG TABS has been denied. It was denied on 10/30 stating in order for this medication to be approved it needs to come directly from a Dermatologist.  [name] said she tried to leave a message for the patient but the voice mail was full. If there are any questions please call [phone]

## 2024-03-27 NOTE — Telephone Encounter (Signed)
 The Otezla was not approved by insurance.  They are stating you must see a dermatologist to get this prescribed.  I will place a referral for dermatology

## 2024-03-27 NOTE — Plan of Care (Signed)
  Problem: Education: Goal: Knowledge of risk factors and measures for prevention of condition will improve Outcome: Progressing   Problem: Education: Goal: Knowledge of General Education information will improve Description: Including pain rating scale, medication(s)/side effects and non-pharmacologic comfort measures Outcome: Progressing   Problem: Clinical Measurements: Goal: Ability to maintain clinical measurements within normal limits will improve Outcome: Progressing   Problem: Activity: Goal: Risk for activity intolerance will decrease Outcome: Progressing   Problem: Coping: Goal: Level of anxiety will decrease Outcome: Progressing   Problem: Elimination: Goal: Will not experience complications related to urinary retention Outcome: Progressing   Problem: Pain Managment: Goal: General experience of comfort will improve and/or be controlled Outcome: Progressing   Problem: Safety: Goal: Ability to remain free from injury will improve Outcome: Progressing

## 2024-03-27 NOTE — Progress Notes (Signed)
 Initial Nutrition Assessment  DOCUMENTATION CODES:   Obesity unspecified  INTERVENTION:   -Liberalize diet to 2 gram sodium diet -MVI with minerals daily -Ensure Plus High Protein po BID, each supplement provides 350 kcal and 20 grams of protein  -RD provided Low Sodium Nutrition Therapy handout from [facility] Nutrition Care Manual; attached to AVS/ discharge summary   NUTRITION DIAGNOSIS:   Increased nutrient needs related to chronic illness (COPD) as evidenced by estimated needs.  GOAL:   Patient will meet greater than or equal to 90% of their needs  MONITOR:   PO intake, Supplement acceptance  REASON FOR ASSESSMENT:   Malnutrition Screening Tool    ASSESSMENT:   68 y.o. female with medical history significant of hypertension, hyperlipidemia, hypothyroidism, chronic respiratory failure on 2 LPM of oxygen , COPD, atrial flutter/fibrillation, bipolar disorder presents due to 1 week PTA onset of generalized weakness, she complained of productive cough of thick yellow phlegm, chest congestion, leg buckling, poor oral intake, fever and that she has been staying in bed most of the time.  She states that she ran out of oxygen  and was without her oxygen  for about 2 days PTA during which she developed  worsening shortness of breath  Patient admitted with acute on chronic respiratory failure possibly secondary to COVID-19 infection and acute COPD exacerbation. Also plan for rule out CHF secondary to elevated proBNP; plan echocardiogram today.   Reviewed I/O's: -800 ml x 24 hours and -560 ml since admission  UOP: 800 ml x 24 hours  Case discussed in interdisciplinary rounds.   Patient unavailable at time of visit. Attempted to speak with patient via call to hospital room phone, however, unable to reach. RD unable to obtain further nutrition-related history or complete nutrition-focused physical exam at this time.    Patient currently on a heart healthy diet. Case discussed with RN,  who reports patient has a great appetite and ate her breakfast this morning.   Reviewed weight history; weight has been stable over the past 6 months.   Medications reviewed and include protonix  and solu-medrol .   Labs reviewed.    Diet Order:   Diet Order             Diet 2 gram sodium Room service appropriate? Yes; Fluid consistency: Thin  Diet effective now                   EDUCATION NEEDS:   No education needs have been identified at this time  Skin:  Skin Assessment: Reviewed RN Assessment  Last BM:  03/25/24  Height:   Ht Readings from Last 1 Encounters:  03/15/24 5' 5 (1.651 m)    Weight:   Wt Readings from Last 1 Encounters:  03/27/24 90.9 kg    Ideal Body Weight:  56.8 kg  BMI:  Body mass index is 33.36 kg/m.  Estimated Nutritional Needs:   Kcal:  1700-1900  Protein:  90-105 grams  Fluid:  1.7-1.9 L    Margery ORN, RD, LDN, CDCES Registered Dietitian III Certified Diabetes Care and Education Specialist If unable to reach this RD, please use RD Inpatient group chat on secure chat between hours of 8am-4 pm daily

## 2024-03-27 NOTE — TOC Initial Note (Signed)
 Transition of Care [facility]) - Initial/Assessment Note    Patient Details  Name: [name] [name] MRN: [id number] Date of Birth: 1956-02-21  [facility]) CM/SW Contact:    Hoy DELENA Bigness, LCSW Phone Number: 03/27/2024, 10:32 AM  Clinical Narrative:                 Pt assessed due to high risk for readmission. Pt from home alone. Pt is fairly independent at baseline and has a shower chair, BSC, and rollator at home. Pt is on 2L of O2 at baseline provided by [employer]. ICM will continue to follow for discharge needs.   Expected Discharge Plan: Home/Self Care Barriers to Discharge: Continued Medical Work up   Patient Goals and CMS Choice Patient states their goals for this hospitalization and ongoing recovery are:: To return home          Expected Discharge Plan and Services In-house Referral: NA Discharge Planning Services: NA Post Acute Care Choice: NA Living arrangements for the past 2 months: Apartment                 DME Arranged: N/A DME Agency: NA                  Prior Living Arrangements/Services Living arrangements for the past 2 months: Apartment Lives with:: Self Patient language and need for interpreter reviewed:: Yes Do you feel safe going back to the place where you live?: Yes      Need for Family Participation in Patient Care: No (Comment) Care giver support system in place?: No (comment) Current home services: DME (rollator, shower chair, BSC, O2 w/ [employer]) Criminal Activity/Legal Involvement Pertinent to Current Situation/Hospitalization: No - Comment as needed  Activities of Daily Living   ADL Screening (condition at time of admission) Independently performs ADLs?: Yes (appropriate for developmental age) Is the patient deaf or have difficulty hearing?: No Does the patient have difficulty seeing, even when wearing glasses/contacts?: No Does the patient have difficulty concentrating, remembering, or making decisions?:  No  Permission Sought/Granted   Permission granted to share information with : No              Emotional Assessment   Attitude/Demeanor/Rapport: Unable to Assess Affect (typically observed): Unable to Assess Orientation: : Oriented to Self, Oriented to Place, Oriented to  Time, Oriented to Situation Alcohol / Substance Use: Not Applicable Psych Involvement: No (comment)  Admission diagnosis:  COPD exacerbation (HCC) [J44.1] AKI (acute kidney injury) [N17.9] Acute on chronic respiratory failure with hypoxia (HCC) [J96.21] COVID-19 [U07.1] Patient Active Problem List   Diagnosis Date Noted   Acute on chronic respiratory failure with hypoxia (HCC) 03/26/2024   Plaque psoriasis 03/17/2024   SVT (supraventricular tachycardia) 10/09/2023   CAD (coronary artery disease) 09/06/2023   Mitral regurgitation and aortic stenosis 08/30/2023   Ascending aorta dilation 08/30/2023   Nicotine  use disorder 09/15/2022   AKI (acute kidney injury) 09/15/2022   Chronic anemia 09/15/2022   Mood disorder 09/15/2022   Hypermagnesemia 09/14/2022   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) 06/10/2022   Community acquired bacterial pneumonia 06/10/2022   Atrial flutter (HCC) 06/04/2022   Urinary retention 06/04/2022   Acute hypoxic respiratory failure (HCC) 06/03/2022   Osteoarthritis of right hip 03/09/2022   Pain in joint of right hip 03/09/2022   Pain in joint of left knee 03/09/2022   Palliative care by specialist    DNR (do not resuscitate)    Body mass index 30.0-30.9, adult  06/12/2018   Bipolar 1 disorder (HCC) 01/11/2018   Healthcare maintenance 05/10/2017   Arthritis of left knee 05/10/2017   Essential hypertension 04/27/2017   Other insomnia 04/27/2017   Chronic pain syndrome 04/27/2017   Recurrent major depressive disorder, in partial remission 04/27/2017   Chronic, continuous use of opioids 04/27/2017   Headache syndrome 04/27/2017   COPD (chronic obstructive  pulmonary disease) (HCC) 12/27/2012   Chronic respiratory failure (HCC) 12/27/2012   History of colonic polyps 12/11/2012   Chronic diarrhea 12/11/2012   Benign neoplasm of colon 12/11/2012   Migraine 12/04/2012   Alcohol abuse 12/04/2012   Hyperlipidemia 07/08/2012   COPD exacerbation (HCC) 04/13/2012   Low back pain 04/13/2012   Type 2 diabetes mellitus (HCC) 04/13/2012   Tobacco abuse 04/13/2012   Acute-on-chronic respiratory failure (HCC) 04/13/2012   Depression    Suicidal thoughts    PCP:  Bevely Doffing, FNP [phone] [place] [place] [facility] [address] [facility] [address] [place] [address] Phone: [phone] Fax: [phone]     Social Drivers of Health (SDOH) Social History: SDOH Screenings   Food Insecurity: No Food Insecurity (03/26/2024)  Housing: Low Risk  (03/26/2024)  Transportation Needs: No Transportation Needs (03/26/2024)  Utilities: Not At Risk (03/26/2024)  Depression (PHQ2-9): Medium Risk (03/15/2024)  Social Connections: Unknown (03/26/2024)  Tobacco Use: Medium Risk (03/15/2024)   SDOH Interventions:     Readmission Risk Interventions    03/27/2024   10:30 AM 09/16/2022    1:33 PM  Readmission Risk Prevention Plan  Transportation Screening Complete Complete  PCP or Specialist Appt within 3-5 Days Complete   HRI or Home Care Consult Complete Complete  Social Work Consult for Recovery Care Planning/Counseling Complete Complete  Palliative Care Screening Not Applicable Not Applicable  Medication Review Oceanographer) Complete Complete

## 2024-03-27 NOTE — Progress Notes (Addendum)
 PROGRESS NOTE    [name] [name]  FMW:[id number] DOB: May 26, 1955 DOA: 03/26/2024 PCP: Bevely Doffing, FNP   Brief Narrative:  68 y.o. female with medical history significant of hypertension, hyperlipidemia, hypothyroidism, chronic respiratory failure on 2 LPM of oxygen , COPD, atrial flutter/fibrillation, bipolar disorder presents to the emergency department due to 1 week onset of generalized weakness, she complained of productive cough of thick yellow phlegm, chest congestion, leg buckling, poor oral intake and fever.   Chest x-ray showed  Low lung volumes with mild bibasilar atelectasis. Mild cardiomegaly with mild pulmonary vascular congestion. A mild component of associated interstitial edema cannot be excluded. Being treated for Acute on chronic respiratory failure with hypoxia possibly secondary to COVID-19 virus infection and COPD exacerbation.   Assessment & Plan:  Principal Problem:   Acute on chronic respiratory failure with hypoxia (HCC)    Acute on chronic respiratory failure with hypoxia possibly secondary to COVID-19 virus infection Acute exacerbation of COPD Continue duo nebs, Mucinex ,azithromycin . Continue Protonix  to prevent steroid-induced ulcer Continue incentive spirometry and flutter valve Changed IV solumedrol to po prednisone  40 mg daily on 11/5 Continue supplemental oxygen  to maintain O2 sat between 88-92%   Elevated proBNP, rule out acute on chronic diastolic CHF proBNP 3,885 Continue total input/output, daily weights and fluid restriction Lasix  temporarily held at this time due to soft BP Continue heart healthy diet      Echocardiogram done on 08/30/2023 showed LVEF of 60 to 65%.  No RWMA.  G1 DD.   Repeat Echocardiogram pending   Acute kidney injury BUN/creatinine 47/1.53 (baseline creatinine at 0.7-1.0) Attempt gentle hydration based on echo findings in the morning Renally adjust medications, avoid nephrotoxic agents/dehydration/hypotension   Presumed  CAP, bacterial, POA: Patient was empirically started on IV ceftriaxone  and azithromycin  Chest x-ray was more revealing for pulmonary vascular congestion and interstitial edema Patient was known to be positive for COVID infection Procalcitonin will be checked prior to continuing with antibiotics   Chronic atrial flutter/fibrillation Continue Eliquis . Temporarily hold Lopressor  due to soft BP   Bipolar disorder Anxiety with depression Continue home Zoloft , Depakote  and BuSpar .   Nicotine  use Continue nicotine  patch She vapes daily   Essential hypertension Hold BP meds at this time due to soft BP   Hyperlipidemia Continue Lipitor  Disposition: Lives at home by herself and uses 2 L nasal oxygen  at all times.  DVT prophylaxis: SCDs Start: 03/27/24 0053 apixaban  (ELIQUIS ) tablet 5 mg     Code Status: Full Code Family Communication:  Daughter is present at the bedside Status is: Inpatient Remains inpatient appropriate because: ARF, COPD exacerbation    Subjective:  She feels shortness of breath and is anxious. She couldn't sleep last night. Daughter is present at the bedside.  Examination:  General exam: Appears to be in mild distress, 2 L nasal oxygen  in place Respiratory system: diminished breath sounds bilaterally Cardiovascular system: S1 & S2 heard, RRR. No JVD, murmurs, rubs, gallops or clicks. No pedal edema. Gastrointestinal system: Abdomen is nondistended, soft and nontender. No organomegaly or masses felt. Normal bowel sounds heard. Central nervous system: Alert and oriented. No focal neurological deficits. Extremities: Symmetric 5 x 5 power. Skin: No rashes, lesions or ulcers Psychiatry: Judgement and insight appear normal. Mood & affect appropriate.       Diet Orders (From admission, onward)     Start     Ordered   03/27/24 0844  Diet 2 gram sodium Room service appropriate? Yes; Fluid consistency: Thin  Diet effective now  Question Answer Comment   Room service appropriate? Yes   Fluid consistency: Thin      03/27/24 0843            Objective: Vitals:   03/27/24 0400 03/27/24 0500 03/27/24 0801 03/27/24 0833  BP: (!) 112/57   (!) 115/52  Pulse: 73   90  Resp: 16   16  Temp: 98.5 F (36.9 C)   98.8 F (37.1 C)  TempSrc: Oral   Oral  SpO2: 92%  93% 92%  Weight:  90.9 kg      Intake/Output Summary (Last 24 hours) at 03/27/2024 1115 Last data filed at 03/27/2024 0600 Gross per 24 hour  Intake --  Output 800 ml  Net -800 ml   Filed Weights   03/27/24 0500  Weight: 90.9 kg    Scheduled Meds:  apixaban   5 mg Oral BID   atorvastatin   20 mg Oral QHS   [START ON 03/28/2024] azithromycin   250 mg Oral Daily   busPIRone   10 mg Oral TID   dextromethorphan -guaiFENesin   1 tablet Oral BID   divalproex   500 mg Oral BID   feeding supplement  237 mL Oral BID BM   ipratropium-albuterol   3 mL Nebulization TID   methylPREDNISolone  (SOLU-MEDROL ) injection  40 mg Intravenous Q12H   multivitamin with minerals  1 tablet Oral Daily   nicotine   21 mg Transdermal Daily   pantoprazole   40 mg Oral Daily   sertraline   200 mg Oral Daily   Continuous Infusions:  Nutritional status Signs/Symptoms: estimated needs Interventions: Ensure Enlive (each supplement provides 350kcal and 20 grams of protein), MVI, Liberalize Diet Body mass index is 33.36 kg/m.  Data Reviewed:   CBC: Recent Labs  Lab 03/26/24 1728 03/27/24 0419  WBC 4.5 3.4*  NEUTROABS 2.8  --   HGB 9.8* 9.5*  HCT 31.9* 30.8*  MCV 93.0 91.7  PLT 159 143*   Basic Metabolic Panel: Recent Labs  Lab 03/26/24 1728 03/27/24 0419  NA 143 142  K 4.4 4.6  CL 101 101  CO2 32 31  GLUCOSE 101* 152*  BUN 47* 50*  CREATININE 1.53* 1.20*  CALCIUM  8.9 8.9  MG 2.2 2.3  PHOS  --  4.3   GFR: Estimated Creatinine Clearance: 50.7 mL/min (A) (by C-G formula based on SCr of 1.2 mg/dL (H)). Liver Function Tests: Recent Labs  Lab 03/26/24 1728 03/27/24 0419  AST <10*  <10*  ALT <5 5  ALKPHOS 62 62  BILITOT 0.3 <0.2  PROT 7.2 7.2  ALBUMIN 3.5 3.4*   No results for input(s): LIPASE, AMYLASE in the last 168 hours. No results for input(s): AMMONIA in the last 168 hours. Coagulation Profile: No results for input(s): INR, PROTIME in the last 168 hours. Cardiac Enzymes: No results for input(s): CKTOTAL, CKMB, CKMBINDEX, TROPONINI in the last 168 hours. BNP (last 3 results) Recent Labs    03/26/24 1728  PROBNP 3,885.0*   HbA1C: No results for input(s): HGBA1C in the last 72 hours. CBG: No results for input(s): GLUCAP in the last 168 hours. Lipid Profile: No results for input(s): CHOL, HDL, LDLCALC, TRIG, CHOLHDL, LDLDIRECT in the last 72 hours. Thyroid Function Tests: No results for input(s): TSH, T4TOTAL, FREET4, T3FREE, THYROIDAB in the last 72 hours. Anemia Panel: No results for input(s): VITAMINB12, FOLATE, FERRITIN, TIBC, IRON, RETICCTPCT in the last 72 hours. Sepsis Labs: Recent Labs  Lab 03/27/24 0419  PROCALCITON <0.10    Recent Results (from the past 240 hours)  Resp  panel by RT-PCR (RSV, Flu A&B, Covid) Anterior Nasal Swab     Status: Abnormal   Collection Time: 03/26/24  5:06 PM   Specimen: Anterior Nasal Swab  Result Value Ref Range Status   SARS Coronavirus 2 by RT PCR POSITIVE (A) NEGATIVE Final    Comment: (NOTE) SARS-CoV-2 target nucleic acids are DETECTED.  The SARS-CoV-2 RNA is generally detectable in upper respiratory specimens during the acute phase of infection. Positive results are indicative of the presence of the identified virus, but do not rule out bacterial infection or co-infection with other pathogens not detected by the test. Clinical correlation with patient history and other diagnostic information is necessary to determine patient infection status. The expected result is Negative.  Fact Sheet for  Patients: bloggercourse.com  Fact Sheet for Healthcare Providers: seriousbroker.it  This test is not yet approved or cleared by the [place]  FDA and  has been authorized for detection and/or diagnosis of SARS-CoV-2 by FDA under an Emergency Use Authorization (EUA).  This EUA will remain in effect (meaning this test can be used) for the duration of  the COVID-19 declaration under Section 564(b)(1) of the A ct, 21 U.S.C. section 360bbb-3(b)(1), unless the authorization is terminated or revoked sooner.     Influenza A by PCR NEGATIVE NEGATIVE Final   Influenza B by PCR NEGATIVE NEGATIVE Final    Comment: (NOTE) The Xpert Xpress SARS-CoV-2/FLU/RSV plus assay is intended as an aid in the diagnosis of influenza from Nasopharyngeal swab specimens and should not be used as a sole basis for treatment. Nasal washings and aspirates are unacceptable for Xpert Xpress SARS-CoV-2/FLU/RSV testing.  Fact Sheet for Patients: bloggercourse.com  Fact Sheet for Healthcare Providers: seriousbroker.it  This test is not yet approved or cleared by the [place]  FDA and has been authorized for detection and/or diagnosis of SARS-CoV-2 by FDA under an Emergency Use Authorization (EUA). This EUA will remain in effect (meaning this test can be used) for the duration of the COVID-19 declaration under Section 564(b)(1) of the Act, 21 U.S.C. section 360bbb-3(b)(1), unless the authorization is terminated or revoked.     Resp Syncytial Virus by PCR NEGATIVE NEGATIVE Final    Comment: (NOTE) Fact Sheet for Patients: bloggercourse.com  Fact Sheet for Healthcare Providers: seriousbroker.it  This test is not yet approved or cleared by the [place]  FDA and has been authorized for detection and/or diagnosis of SARS-CoV-2 by FDA under an Emergency Use  Authorization (EUA). This EUA will remain in effect (meaning this test can be used) for the duration of the COVID-19 declaration under Section 564(b)(1) of the Act, 21 U.S.C. section 360bbb-3(b)(1), unless the authorization is terminated or revoked.  Performed at [facility], [address], [place], [place] [address]          Radiology Studies: [facility] 1 View Result Date: 03/26/2024 CLINICAL DATA:  Shortness of breath and productive cough. EXAM: PORTABLE CHEST 1 VIEW COMPARISON:  July 21, 2023 FINDINGS: The cardiac silhouette is mildly enlarged and unchanged in size. Low lung volumes are noted. There is prominence of the pulmonary vasculature with mildly increased interstitial lung markings. Mild atelectatic changes are seen within the bilateral lung bases. No pleural effusion or pneumothorax is identified. A chronic seventh left rib fracture is seen. Multilevel degenerative changes are present throughout the thoracic spine. IMPRESSION: 1. Low lung volumes with mild bibasilar atelectasis. 2. Mild cardiomegaly with mild pulmonary vascular congestion. A mild component of associated interstitial edema cannot be excluded. Electronically Signed  By: Suzen Dials M.D.   On: 03/26/2024 17:49           LOS: 1 day   Time spent= 35 mins    Deliliah Room, MD [facility]  If 7PM-7AM, please contact night-coverage  03/27/2024, 11:15 AM

## 2024-03-28 ENCOUNTER — Inpatient Hospital Stay (HOSPITAL_COMMUNITY)

## 2024-03-28 DIAGNOSIS — J9621 Acute and chronic respiratory failure with hypoxia: Secondary | ICD-10-CM | POA: Diagnosis not present

## 2024-03-28 DIAGNOSIS — R0602 Shortness of breath: Secondary | ICD-10-CM

## 2024-03-28 LAB — ECHOCARDIOGRAM COMPLETE
AR max vel: 2.24 cm2
AV Area VTI: 2.23 cm2
AV Area mean vel: 2.11 cm2
AV Mean grad: 14.6 mmHg
AV Peak grad: 27 mmHg
Ao pk vel: 2.6 m/s
Area-P 1/2: 3.31 cm2
Calc EF: 66.2 %
MV VTI: 3.09 cm2
S' Lateral: 3.3 cm
Single Plane A2C EF: 64.9 %
Single Plane A4C EF: 68.4 %
Weight: 3209.9 [oz_av]

## 2024-03-28 LAB — LIPASE, BLOOD: Lipase: 86 U/L — ABNORMAL HIGH (ref 11–51)

## 2024-03-28 MED ORDER — IOHEXOL 9 MG/ML PO SOLN
ORAL | Status: AC
  Filled 2024-03-28: qty 1000

## 2024-03-28 MED ORDER — SODIUM CHLORIDE 0.9 % IV BOLUS
500.0000 mL | Freq: Once | INTRAVENOUS | Status: AC
  Administered 2024-03-28: 500 mL via INTRAVENOUS

## 2024-03-28 NOTE — Progress Notes (Addendum)
 9149: Made Dr Dino aware that patient reports having cramping, nausea, bloating since Oct 13, but hasn't seen MD outpatient, last BM was a few days ago, and patient's daughter Lonell would like MD update when possible, number is listed in chart.   1344: RN messaged Dr Dino that patient's daughter requesting results of CT from MD. MD advised would call daughter to review  At approx 1510: Soap suds enema given by Delon RN and Danielle NT per order tolerated well. Produced medium brown soft and liquid BM  1644 Made Dr Dino  aware that patient's BP was 87/48, 91/46 map 60 HR 80s and just rechecked is 100/61 map 72. MD ordered NS 500 ml IV bolus over 2 hours. RN started this.   1755: BP 105/48 HR 88  AT 1830: Patient ambulated with walker and 1 staff assist in room to door and back twice. Mildly dyspneic. Tolerated fairly well.   1840: RN made Dr Dino aware that daughter asked for patient's seroquel  to be ordered for bedtime that is listed on patients PTA meds

## 2024-03-28 NOTE — Progress Notes (Signed)
  Echocardiogram 2D Echocardiogram has been performed.  [name] [name] 03/28/2024, 2:40 PM

## 2024-03-28 NOTE — Plan of Care (Signed)
  Problem: Coping: Goal: Psychosocial and spiritual needs will be supported Outcome: Progressing   Problem: Respiratory: Goal: Will maintain a patent airway Outcome: Progressing   Problem: Health Behavior/Discharge Planning: Goal: Ability to manage health-related needs will improve Outcome: Progressing   Problem: Clinical Measurements: Goal: Ability to maintain clinical measurements within normal limits will improve Outcome: Progressing Goal: Will remain free from infection Outcome: Progressing

## 2024-03-28 NOTE — Progress Notes (Addendum)
 PROGRESS NOTE    [name] [name]  FMW:[id number] DOB: 30-Jan-1956 DOA: 03/26/2024 PCP: Bevely Doffing, FNP   Brief Narrative:  68 y.o. female with medical history significant of hypertension, hyperlipidemia, hypothyroidism, chronic respiratory failure on 2 LPM of oxygen , COPD, atrial flutter/fibrillation, bipolar disorder presents to the emergency department due to 1 week onset of generalized weakness, she complained of productive cough of thick yellow phlegm, chest congestion, leg buckling, poor oral intake and fever.   Chest x-ray showed  Low lung volumes with mild bibasilar atelectasis. Mild cardiomegaly with mild pulmonary vascular congestion. A mild component of associated interstitial edema cannot be excluded. Being treated for Acute on chronic respiratory failure with hypoxia possibly secondary to COVID-19 virus infection and COPD exacerbation.   Assessment & Plan:  Principal Problem:   Acute on chronic respiratory failure with hypoxia (HCC)    Acute on chronic respiratory failure with hypoxia possibly secondary to COVID-19 virus infection Acute exacerbation of COPD Continue duo nebs, Mucinex ,azithromycin . Continue Protonix  to prevent steroid-induced ulcer Continue incentive spirometry and flutter valve Changed IV solumedrol to po prednisone  40 mg daily on 11/5 Continue supplemental oxygen  to maintain O2 sat between 88-92%   Elevated proBNP, rule out acute on chronic diastolic CHF proBNP 3,885 Continue total input/output, daily weights and fluid restriction Lasix  temporarily held at this time due to soft BP Continue heart healthy diet      Echocardiogram done on 08/30/2023 showed LVEF of 60 to 65%.  No RWMA.  G1 DD.   Repeat Echocardiogram pending   Acute kidney injury, improving BUN/creatinine 47/1.53 (baseline creatinine at 0.7-1.0). Cr today is 1.2. Attempt gentle hydration based on echo findings in the morning Renally adjust medications, avoid nephrotoxic  agents/dehydration/hypotension  Abdominal pain with tenderness: Ordered STAT CT abdomen/pelvis to rule out bowel obstruction/perforation. Prn analgesics It showed mild to moderate uncomplicated pancreatitis. Started on clear liquids Ordered soap suds enema as she didn't have a bowel movement in over a week.    Presumed CAP, bacterial, POA: Patient was empirically started on IV ceftriaxone  and azithromycin  Chest x-ray was more revealing for pulmonary vascular congestion and interstitial edema Patient was known to be positive for COVID infection   Chronic atrial flutter/fibrillation Continue Eliquis . Temporarily hold Lopressor  due to soft BP   Bipolar disorder Anxiety with depression Continue home Zoloft , Depakote  and BuSpar .   Nicotine  use Continue nicotine  patch She vapes daily   Essential hypertension Hold BP meds at this time due to soft BP   Hyperlipidemia Continue Lipitor  Disposition: Lives at home by herself and uses 2 L nasal oxygen  at all times.  DVT prophylaxis: SCDs Start: 03/27/24 0053 apixaban  (ELIQUIS ) tablet 5 mg     Code Status: Full Code Family Communication:  Daughter, Lonell on the phone Status is: Inpatient Remains inpatient appropriate because: ARF, COPD exacerbation    Subjective:  She said that her abdomen is bothering her and she feels bloated. She didn't have a bowel movements since 3-4 days now. I spoke to her about ordering a CT abdomen pelvis.   Examination:  General exam: Appears to be in mild distress, 2 L nasal oxygen  in place Respiratory system: diminished breath sounds bilaterally Cardiovascular system: S1 & S2 heard, RRR. No JVD, murmurs, rubs, gallops or clicks. No pedal edema. Gastrointestinal system: Abdomen is slightly distended, tender to palpation in lower quadrants Central nervous system: Alert and oriented. No focal neurological deficits. Extremities: Symmetric 5 x 5 power. Skin: No rashes, lesions or ulcers Psychiatry:  Judgement and insight  appear normal. Mood & affect appropriate.       Diet Orders (From admission, onward)     Start     Ordered   03/27/24 0844  Diet 2 gram sodium Room service appropriate? Yes; Fluid consistency: Thin  Diet effective now       Question Answer Comment  Room service appropriate? Yes   Fluid consistency: Thin      03/27/24 0843            Objective: Vitals:   03/28/24 0500 03/28/24 0536 03/28/24 0754 03/28/24 0904  BP:  100/63 105/60 (!) 106/56  Pulse:  (!) 104 90   Resp:  16 16   Temp:  99.4 F (37.4 C) 98.6 F (37 C)   TempSrc:  Axillary Oral   SpO2:  94% 90%   Weight: 91 kg       Intake/Output Summary (Last 24 hours) at 03/28/2024 9072 Last data filed at 03/27/2024 2150 Gross per 24 hour  Intake --  Output 200 ml  Net -200 ml   Filed Weights   03/27/24 0500 03/28/24 0500  Weight: 90.9 kg 91 kg    Scheduled Meds:  apixaban   5 mg Oral BID   atorvastatin   20 mg Oral QHS   azithromycin   250 mg Oral Daily   busPIRone   10 mg Oral TID   dextromethorphan -guaiFENesin   1 tablet Oral BID   divalproex   500 mg Oral BID   feeding supplement  237 mL Oral BID BM   ipratropium-albuterol   3 mL Nebulization TID   multivitamin with minerals  1 tablet Oral Daily   nicotine   21 mg Transdermal Daily   pantoprazole   40 mg Oral Daily   predniSONE   40 mg Oral Q breakfast   sertraline   200 mg Oral Daily   Continuous Infusions:  Nutritional status Signs/Symptoms: estimated needs Interventions: Ensure Enlive (each supplement provides 350kcal and 20 grams of protein), MVI, Liberalize Diet Body mass index is 33.38 kg/m.  Data Reviewed:   CBC: Recent Labs  Lab 03/26/24 1728 03/27/24 0419  WBC 4.5 3.4*  NEUTROABS 2.8  --   HGB 9.8* 9.5*  HCT 31.9* 30.8*  MCV 93.0 91.7  PLT 159 143*   Basic Metabolic Panel: Recent Labs  Lab 03/26/24 1728 03/27/24 0419  NA 143 142  K 4.4 4.6  CL 101 101  CO2 32 31  GLUCOSE 101* 152*  BUN 47* 50*   CREATININE 1.53* 1.20*  CALCIUM  8.9 8.9  MG 2.2 2.3  PHOS  --  4.3   GFR: Estimated Creatinine Clearance: 50.7 mL/min (A) (by C-G formula based on SCr of 1.2 mg/dL (H)). Liver Function Tests: Recent Labs  Lab 03/26/24 1728 03/27/24 0419  AST <10* <10*  ALT <5 5  ALKPHOS 62 62  BILITOT 0.3 <0.2  PROT 7.2 7.2  ALBUMIN 3.5 3.4*   No results for input(s): LIPASE, AMYLASE in the last 168 hours. No results for input(s): AMMONIA in the last 168 hours. Coagulation Profile: No results for input(s): INR, PROTIME in the last 168 hours. Cardiac Enzymes: No results for input(s): CKTOTAL, CKMB, CKMBINDEX, TROPONINI in the last 168 hours. BNP (last 3 results) Recent Labs    03/26/24 1728  PROBNP 3,885.0*   HbA1C: No results for input(s): HGBA1C in the last 72 hours. CBG: No results for input(s): GLUCAP in the last 168 hours. Lipid Profile: No results for input(s): CHOL, HDL, LDLCALC, TRIG, CHOLHDL, LDLDIRECT in the last 72 hours. Thyroid Function Tests: No results  for input(s): TSH, T4TOTAL, FREET4, T3FREE, THYROIDAB in the last 72 hours. Anemia Panel: No results for input(s): VITAMINB12, FOLATE, FERRITIN, TIBC, IRON, RETICCTPCT in the last 72 hours. Sepsis Labs: Recent Labs  Lab 03/27/24 0419  PROCALCITON <0.10    Recent Results (from the past 240 hours)  Resp panel by RT-PCR (RSV, Flu A&B, Covid) Anterior Nasal Swab     Status: Abnormal   Collection Time: 03/26/24  5:06 PM   Specimen: Anterior Nasal Swab  Result Value Ref Range Status   SARS Coronavirus 2 by RT PCR POSITIVE (A) NEGATIVE Final    Comment: (NOTE) SARS-CoV-2 target nucleic acids are DETECTED.  The SARS-CoV-2 RNA is generally detectable in upper respiratory specimens during the acute phase of infection. Positive results are indicative of the presence of the identified virus, but do not rule out bacterial infection or co-infection with other pathogens  not detected by the test. Clinical correlation with patient history and other diagnostic information is necessary to determine patient infection status. The expected result is Negative.  Fact Sheet for Patients: bloggercourse.com  Fact Sheet for Healthcare Providers: seriousbroker.it  This test is not yet approved or cleared by the [place]  FDA and  has been authorized for detection and/or diagnosis of SARS-CoV-2 by FDA under an Emergency Use Authorization (EUA).  This EUA will remain in effect (meaning this test can be used) for the duration of  the COVID-19 declaration under Section 564(b)(1) of the A ct, 21 U.S.C. section 360bbb-3(b)(1), unless the authorization is terminated or revoked sooner.     Influenza A by PCR NEGATIVE NEGATIVE Final   Influenza B by PCR NEGATIVE NEGATIVE Final    Comment: (NOTE) The Xpert Xpress SARS-CoV-2/FLU/RSV plus assay is intended as an aid in the diagnosis of influenza from Nasopharyngeal swab specimens and should not be used as a sole basis for treatment. Nasal washings and aspirates are unacceptable for Xpert Xpress SARS-CoV-2/FLU/RSV testing.  Fact Sheet for Patients: bloggercourse.com  Fact Sheet for Healthcare Providers: seriousbroker.it  This test is not yet approved or cleared by the [place]  FDA and has been authorized for detection and/or diagnosis of SARS-CoV-2 by FDA under an Emergency Use Authorization (EUA). This EUA will remain in effect (meaning this test can be used) for the duration of the COVID-19 declaration under Section 564(b)(1) of the Act, 21 U.S.C. section 360bbb-3(b)(1), unless the authorization is terminated or revoked.     Resp Syncytial Virus by PCR NEGATIVE NEGATIVE Final    Comment: (NOTE) Fact Sheet for Patients: bloggercourse.com  Fact Sheet for Healthcare  Providers: seriousbroker.it  This test is not yet approved or cleared by the [place]  FDA and has been authorized for detection and/or diagnosis of SARS-CoV-2 by FDA under an Emergency Use Authorization (EUA). This EUA will remain in effect (meaning this test can be used) for the duration of the COVID-19 declaration under Section 564(b)(1) of the Act, 21 U.S.C. section 360bbb-3(b)(1), unless the authorization is terminated or revoked.  Performed at [facility], [address], [place], [place] [address]          Radiology Studies: [facility] 1 View Result Date: 03/26/2024 CLINICAL DATA:  Shortness of breath and productive cough. EXAM: PORTABLE CHEST 1 VIEW COMPARISON:  July 21, 2023 FINDINGS: The cardiac silhouette is mildly enlarged and unchanged in size. Low lung volumes are noted. There is prominence of the pulmonary vasculature with mildly increased interstitial lung markings. Mild atelectatic changes are seen within the bilateral lung bases. No  pleural effusion or pneumothorax is identified. A chronic seventh left rib fracture is seen. Multilevel degenerative changes are present throughout the thoracic spine. IMPRESSION: 1. Low lung volumes with mild bibasilar atelectasis. 2. Mild cardiomegaly with mild pulmonary vascular congestion. A mild component of associated interstitial edema cannot be excluded. Electronically Signed   By: Suzen Dials M.D.   On: 03/26/2024 17:49           LOS: 2 days   Time spent= 39 mins    Deliliah Room, MD [facility]  If 7PM-7AM, please contact night-coverage  03/28/2024, 9:27 AM

## 2024-03-28 NOTE — Plan of Care (Signed)

## 2024-03-29 DIAGNOSIS — J9621 Acute and chronic respiratory failure with hypoxia: Secondary | ICD-10-CM | POA: Diagnosis not present

## 2024-03-29 LAB — CBC WITH DIFFERENTIAL/PLATELET
Abs Immature Granulocytes: 0.06 K/uL (ref 0.00–0.07)
Basophils Absolute: 0 K/uL (ref 0.0–0.1)
Basophils Relative: 1 %
Eosinophils Absolute: 0.1 K/uL (ref 0.0–0.5)
Eosinophils Relative: 1 %
HCT: 31.2 % — ABNORMAL LOW (ref 36.0–46.0)
Hemoglobin: 9.7 g/dL — ABNORMAL LOW (ref 12.0–15.0)
Immature Granulocytes: 1 %
Lymphocytes Relative: 13 %
Lymphs Abs: 0.7 K/uL (ref 0.7–4.0)
MCH: 29 pg (ref 26.0–34.0)
MCHC: 31.1 g/dL (ref 30.0–36.0)
MCV: 93.4 fL (ref 80.0–100.0)
Monocytes Absolute: 0.4 K/uL (ref 0.1–1.0)
Monocytes Relative: 7 %
Neutro Abs: 4.1 K/uL (ref 1.7–7.7)
Neutrophils Relative %: 77 %
Platelets: 145 K/uL — ABNORMAL LOW (ref 150–400)
RBC: 3.34 MIL/uL — ABNORMAL LOW (ref 3.87–5.11)
RDW: 13 % (ref 11.5–15.5)
WBC: 5.4 K/uL (ref 4.0–10.5)
nRBC: 0 % (ref 0.0–0.2)

## 2024-03-29 LAB — BASIC METABOLIC PANEL WITH GFR
Anion gap: 7 (ref 5–15)
BUN: 53 mg/dL — ABNORMAL HIGH (ref 8–23)
CO2: 34 mmol/L — ABNORMAL HIGH (ref 22–32)
Calcium: 8.9 mg/dL (ref 8.9–10.3)
Chloride: 102 mmol/L (ref 98–111)
Creatinine, Ser: 0.85 mg/dL (ref 0.44–1.00)
GFR, Estimated: >60 mL/min
Glucose, Bld: 80 mg/dL (ref 70–99)
Potassium: 4.3 mmol/L (ref 3.5–5.1)
Sodium: 142 mmol/L (ref 135–145)

## 2024-03-29 MED ORDER — GUAIFENESIN-DM 100-10 MG/5ML PO SYRP: 5.0000 mL | ORAL_SOLUTION | ORAL | Status: DC | PRN

## 2024-03-29 MED ORDER — IPRATROPIUM-ALBUTEROL 0.5-2.5 (3) MG/3ML IN SOLN
3.0000 mL | Freq: Two times a day (BID) | RESPIRATORY_TRACT | Status: DC
  Administered 2024-03-29 – 2024-03-31 (×4): 3 mL via RESPIRATORY_TRACT
  Filled 2024-03-29 (×5): qty 3

## 2024-03-29 NOTE — Progress Notes (Signed)
 PROGRESS NOTE    [name] [name]  FMW:[id number] DOB: 06-02-1955 DOA: 03/26/2024 PCP: Bevely Doffing, FNP   Brief Narrative:  68 y.o. female with medical history significant of hypertension, hyperlipidemia, hypothyroidism, chronic respiratory failure on 2 LPM of oxygen , COPD, atrial flutter/fibrillation, bipolar disorder presents to the emergency department due to 1 week onset of generalized weakness, she complained of productive cough of thick yellow phlegm, chest congestion, leg buckling, poor oral intake and fever.   Chest x-ray showed  Low lung volumes with mild bibasilar atelectasis. Mild cardiomegaly with mild pulmonary vascular congestion. A mild component of associated interstitial edema cannot be excluded. Being treated for Acute on chronic respiratory failure with hypoxia possibly secondary to COVID-19 virus infection and COPD exacerbation.   Assessment & Plan:  Principal Problem:   Acute on chronic respiratory failure with hypoxia (HCC)    Acute on chronic respiratory failure with hypoxia possibly secondary to COVID-19 virus infection Acute exacerbation of COPD Continue duo nebs, Mucinex ,azithromycin . Continue Protonix  to prevent steroid-induced ulcer Continue incentive spirometry and flutter valve Changed IV solumedrol to po prednisone  40 mg daily on 11/5 Continue supplemental oxygen  to maintain O2 sat between 88-92%   Elevated proBNP, rule out acute on chronic diastolic CHF proBNP 3,885 Continue total input/output, daily weights and fluid restriction Lasix  temporarily held at this time due to soft BP Continue heart healthy diet      Echocardiogram done on 08/30/2023 showed LVEF of 60 to 65%.  No RWMA.  G1 DD.   Repeat Echocardiogram showed EF of 60 to 65% with grade 1 diastolic dysfunction.   Acute kidney injury, resolved BUN/creatinine 47/1.53 (baseline creatinine at 0.7-1.0). Cr today is within normal limits. Renally adjust medications, avoid nephrotoxic  agents/dehydration/hypotension  Abdominal pain with tenderness: Ordered STAT CT abdomen/pelvis to rule out bowel obstruction/perforation.  It showed evidence of mild to moderate uncomplicated pancreatitis. Prn analgesics It showed mild to moderate uncomplicated pancreatitis. Started on clear liquids Ordered soap suds enema as she didn't have a bowel movement in over a week.    Presumed CAP, bacterial, POA: Patient was empirically started on IV ceftriaxone  and azithromycin  Chest x-ray was more revealing for pulmonary vascular congestion and interstitial edema Patient was known to be positive for COVID infection   Chronic atrial flutter/fibrillation Continue Eliquis . Temporarily hold Lopressor  due to soft BP   Bipolar disorder Anxiety with depression Continue home Zoloft , Depakote  and BuSpar .   Nicotine  use Continue nicotine  patch She vapes daily   Essential hypertension Hold BP meds at this time due to soft BP   Hyperlipidemia Continue Lipitor  Disposition: Lives at home by herself and uses 2 L nasal oxygen  at all times.  DVT prophylaxis: SCDs Start: 03/27/24 0053 apixaban  (ELIQUIS ) tablet 5 mg     Code Status: Full Code Family Communication:  Daughter, Lonell on the phone Status is: Inpatient Remains inpatient appropriate because: ARF, COPD exacerbation    Subjective:  She did have a bowel movement after she got enema yesterday.  Abdominal pain has improved.  She was getting breathing treatment this morning.  She said that she did walk some yesterday and is planning to walk today as well and sitting in her chair as long as she can .we also spoke about advancing her diet from clear liquid to full liquid today and she is agreeable to that.  Examination:  General exam: Appears to be in mild distress, 2 L nasal oxygen  in place Respiratory system: diminished breath sounds bilaterally Cardiovascular system: S1 & S2 heard,  RRR. No JVD, murmurs, rubs, gallops or clicks. No  pedal edema. Gastrointestinal system: Abdomen is slightly distended, tender to palpation in lower quadrants Central nervous system: Alert and oriented. No focal neurological deficits. Extremities: Symmetric 5 x 5 power. Skin: No rashes, lesions or ulcers Psychiatry: Judgement and insight appear normal. Mood & affect appropriate.       Diet Orders (From admission, onward)     Start     Ordered   03/28/24 1346  Diet clear liquid Room service appropriate? Yes; Fluid consistency: Thin  Diet effective now       Question Answer Comment  Room service appropriate? Yes   Fluid consistency: Thin      03/28/24 1345            Objective: Vitals:   03/28/24 2249 03/29/24 0025 03/29/24 0358 03/29/24 0658  BP:  127/74  (!) 117/46  Pulse: 63 96  74  Resp: 18 20  15   Temp:   98.7 F (37.1 C)   TempSrc:   Axillary   SpO2: 95% 95%  98%  Weight:   92.3 kg     Intake/Output Summary (Last 24 hours) at 03/29/2024 0947 Last data filed at 03/28/2024 1939 Gross per 24 hour  Intake 508.39 ml  Output --  Net 508.39 ml   Filed Weights   03/28/24 0500 03/28/24 1937 03/29/24 0358  Weight: 91 kg 92.3 kg 92.3 kg    Scheduled Meds:  apixaban   5 mg Oral BID   atorvastatin   20 mg Oral QHS   azithromycin   250 mg Oral Daily   busPIRone   10 mg Oral TID   dextromethorphan -guaiFENesin   1 tablet Oral BID   divalproex   500 mg Oral BID   feeding supplement  237 mL Oral BID BM   ipratropium-albuterol   3 mL Nebulization TID   multivitamin with minerals  1 tablet Oral Daily   nicotine   21 mg Transdermal Daily   pantoprazole   40 mg Oral Daily   predniSONE   40 mg Oral Q breakfast   sertraline   200 mg Oral Daily   Continuous Infusions:  Nutritional status Signs/Symptoms: estimated needs Interventions: Ensure Enlive (each supplement provides 350kcal and 20 grams of protein), MVI, Liberalize Diet Body mass index is 33.86 kg/m.  Data Reviewed:   CBC: Recent Labs  Lab 03/26/24 1728  03/27/24 0419 03/29/24 0441  WBC 4.5 3.4* 5.4  NEUTROABS 2.8  --  4.1  HGB 9.8* 9.5* 9.7*  HCT 31.9* 30.8* 31.2*  MCV 93.0 91.7 93.4  PLT 159 143* 145*   Basic Metabolic Panel: Recent Labs  Lab 03/26/24 1728 03/27/24 0419 03/29/24 0441  NA 143 142 142  K 4.4 4.6 4.3  CL 101 101 102  CO2 32 31 34*  GLUCOSE 101* 152* 80  BUN 47* 50* 53*  CREATININE 1.53* 1.20* 0.85  CALCIUM  8.9 8.9 8.9  MG 2.2 2.3  --   PHOS  --  4.3  --    GFR: Estimated Creatinine Clearance: 72.1 mL/min (by C-G formula based on SCr of 0.85 mg/dL). Liver Function Tests: Recent Labs  Lab 03/26/24 1728 03/27/24 0419  AST <10* <10*  ALT <5 5  ALKPHOS 62 62  BILITOT 0.3 <0.2  PROT 7.2 7.2  ALBUMIN 3.5 3.4*   Recent Labs  Lab 03/28/24 1639  LIPASE 86*   No results for input(s): AMMONIA in the last 168 hours. Coagulation Profile: No results for input(s): INR, PROTIME in the last 168 hours. Cardiac Enzymes: No results  for input(s): CKTOTAL, CKMB, CKMBINDEX, TROPONINI in the last 168 hours. BNP (last 3 results) Recent Labs    03/26/24 1728  PROBNP 3,885.0*   HbA1C: No results for input(s): HGBA1C in the last 72 hours. CBG: No results for input(s): GLUCAP in the last 168 hours. Lipid Profile: No results for input(s): CHOL, HDL, LDLCALC, TRIG, CHOLHDL, LDLDIRECT in the last 72 hours. Thyroid Function Tests: No results for input(s): TSH, T4TOTAL, FREET4, T3FREE, THYROIDAB in the last 72 hours. Anemia Panel: No results for input(s): VITAMINB12, FOLATE, FERRITIN, TIBC, IRON, RETICCTPCT in the last 72 hours. Sepsis Labs: Recent Labs  Lab 03/27/24 0419  PROCALCITON <0.10    Recent Results (from the past 240 hours)  Resp panel by RT-PCR (RSV, Flu A&B, Covid) Anterior Nasal Swab     Status: Abnormal   Collection Time: 03/26/24  5:06 PM   Specimen: Anterior Nasal Swab  Result Value Ref Range Status   SARS Coronavirus 2 by RT PCR POSITIVE  (A) NEGATIVE Final    Comment: (NOTE) SARS-CoV-2 target nucleic acids are DETECTED.  The SARS-CoV-2 RNA is generally detectable in upper respiratory specimens during the acute phase of infection. Positive results are indicative of the presence of the identified virus, but do not rule out bacterial infection or co-infection with other pathogens not detected by the test. Clinical correlation with patient history and other diagnostic information is necessary to determine patient infection status. The expected result is Negative.  Fact Sheet for Patients: bloggercourse.com  Fact Sheet for Healthcare Providers: seriousbroker.it  This test is not yet approved or cleared by the [place]  FDA and  has been authorized for detection and/or diagnosis of SARS-CoV-2 by FDA under an Emergency Use Authorization (EUA).  This EUA will remain in effect (meaning this test can be used) for the duration of  the COVID-19 declaration under Section 564(b)(1) of the A ct, 21 U.S.C. section 360bbb-3(b)(1), unless the authorization is terminated or revoked sooner.     Influenza A by PCR NEGATIVE NEGATIVE Final   Influenza B by PCR NEGATIVE NEGATIVE Final    Comment: (NOTE) The Xpert Xpress SARS-CoV-2/FLU/RSV plus assay is intended as an aid in the diagnosis of influenza from Nasopharyngeal swab specimens and should not be used as a sole basis for treatment. Nasal washings and aspirates are unacceptable for Xpert Xpress SARS-CoV-2/FLU/RSV testing.  Fact Sheet for Patients: bloggercourse.com  Fact Sheet for Healthcare Providers: seriousbroker.it  This test is not yet approved or cleared by the [place]  FDA and has been authorized for detection and/or diagnosis of SARS-CoV-2 by FDA under an Emergency Use Authorization (EUA). This EUA will remain in effect (meaning this test can be used) for the  duration of the COVID-19 declaration under Section 564(b)(1) of the Act, 21 U.S.C. section 360bbb-3(b)(1), unless the authorization is terminated or revoked.     Resp Syncytial Virus by PCR NEGATIVE NEGATIVE Final    Comment: (NOTE) Fact Sheet for Patients: bloggercourse.com  Fact Sheet for Healthcare Providers: seriousbroker.it  This test is not yet approved or cleared by the [place]  FDA and has been authorized for detection and/or diagnosis of SARS-CoV-2 by FDA under an Emergency Use Authorization (EUA). This EUA will remain in effect (meaning this test can be used) for the duration of the COVID-19 declaration under Section 564(b)(1) of the Act, 21 U.S.C. section 360bbb-3(b)(1), unless the authorization is terminated or revoked.  Performed at [facility], [address] [place], [place] [address]  Radiology Studies: ECHOCARDIOGRAM COMPLETE Result Date: 03/28/2024    ECHOCARDIOGRAM REPORT   Patient Name:   [name] [name] Date of Exam: 03/28/2024 Medical Rec #:  [id number]         Height:       65.0 in Accession #:    [id number]        Weight:       200.6 lb Date of Birth:  [birth year]         BSA:          1.981 m Patient Age:    68 years          BP:           124/66 mmHg Patient Gender: F                 HR:           82 bpm. Exam Location:  [facility] Procedure: 2D Echo, Cardiac Doppler and Color Doppler (Both Spectral and Color            Flow Doppler were utilized during procedure). Indications:    R06.02 SOB; I50.40* Unspecified combined systolic (congestive)                 and diastolic (congestive) heart failure  History:        Patient has prior history of Echocardiogram examinations, most                 recent 09/09/2023. Abnormal ECG, COPD, Aortic Valve Disease and                 Mitral Valve Disease, Arrythmias:Atrial Flutter; Risk                 Factors:Former Smoker, Hypertension, Diabetes and  Dyslipidemia.  Sonographer:    Ellouise Mose RDCS Referring Phys: [id number] [facility] Tauheed Mcfayden  Sonographer Comments: Suboptimal parasternal window and Technically difficult study due to poor echo windows. Covid positive. Attempted to turn. IMPRESSIONS  1. Left ventricular ejection fraction, by estimation, is 60 to 65%. The left ventricle has normal function. Left ventricular endocardial border not optimally defined to evaluate regional wall motion. Left ventricular diastolic parameters are consistent with Grade I diastolic dysfunction (impaired relaxation). Elevated left atrial pressure.  2. Right ventricular systolic function is normal. The right ventricular size is normal.  3. Left atrial size was mildly dilated.  4. The mitral valve is degenerative. Mild to moderate mitral valve regurgitation. Mild mitral stenosis. The mean mitral valve gradient is 3.5 mmHg. Severe mitral annular calcification.  5. The aortic valve was not well visualized. Aortic valve regurgitation is not visualized. Mild aortic valve stenosis. Aortic valve area, by VTI measures 2.23 cm. Aortic valve mean gradient measures 14.6 mmHg. Aortic valve Vmax measures 2.60 m/s.  6. The inferior vena cava is normal in size with greater than 50% respiratory variability, suggesting right atrial pressure of 3 mmHg. FINDINGS  Left Ventricle: Left ventricular ejection fraction, by estimation, is 60 to 65%. The left ventricle has normal function. Left ventricular endocardial border not optimally defined to evaluate regional wall motion. The left ventricular internal cavity size was normal in size. There is no left ventricular hypertrophy. Left ventricular diastolic parameters are consistent with Grade I diastolic dysfunction (impaired relaxation). Elevated left atrial pressure. Right Ventricle: The right ventricular size is normal. No increase in right ventricular wall thickness. Right ventricular systolic function is normal. Left Atrium: Left atrial size was mildly  dilated. Right Atrium: Right atrial  size was normal in size. Pericardium: There is no evidence of pericardial effusion. Mitral Valve: The mitral valve is degenerative in appearance. There is moderate thickening of the mitral valve leaflet(s). There is moderate calcification of the mitral valve leaflet(s). Severe mitral annular calcification. Mild to moderate mitral valve regurgitation. Mild mitral valve stenosis. MV peak gradient, 9.5 mmHg. The mean mitral valve gradient is 3.5 mmHg. Tricuspid Valve: The tricuspid valve is normal in structure. Tricuspid valve regurgitation is not demonstrated. No evidence of tricuspid stenosis. Aortic Valve: The aortic valve was not well visualized. Aortic valve regurgitation is not visualized. Mild aortic stenosis is present. Aortic valve mean gradient measures 14.6 mmHg. Aortic valve peak gradient measures 27.0 mmHg. Aortic valve area, by VTI  measures 2.23 cm. Pulmonic Valve: The pulmonic valve was not well visualized. Pulmonic valve regurgitation is not visualized. No evidence of pulmonic stenosis. Aorta: The aortic root was not well visualized. Venous: The inferior vena cava is normal in size with greater than 50% respiratory variability, suggesting right atrial pressure of 3 mmHg. IAS/Shunts: The interatrial septum was not well visualized.  LEFT VENTRICLE PLAX 2D LVIDd:         4.90 cm     Diastology LVIDs:         3.30 cm     LV e' medial:    4.79 cm/s LV PW:         0.90 cm     LV E/e' medial:  21.7 LV IVS:        1.00 cm     LV e' lateral:   4.90 cm/s LVOT diam:     2.30 cm     LV E/e' lateral: 21.2 LV SV:         109 LV SV Index:   55 LVOT Area:     4.15 cm  LV Volumes (MOD) LV vol d, MOD A2C: 35.3 ml LV vol d, MOD A4C: 50.7 ml LV vol s, MOD A2C: 12.4 ml LV vol s, MOD A4C: 16.0 ml LV SV MOD A2C:     22.9 ml LV SV MOD A4C:     50.7 ml LV SV MOD BP:      28.6 ml RIGHT VENTRICLE            IVC RV S prime:     9.90 cm/s  IVC diam: 1.40 cm TAPSE (M-mode): 1.5 cm LEFT ATRIUM              Index        RIGHT ATRIUM           Index LA diam:        3.60 cm 1.82 cm/m   RA Area:     13.50 cm LA Vol (A2C):   70.7 ml 35.69 ml/m  RA Volume:   28.80 ml  14.54 ml/m LA Vol (A4C):   71.6 ml 36.15 ml/m LA Biplane Vol: 70.8 ml 35.74 ml/m  AORTIC VALVE AV Area (Vmax):    2.24 cm AV Area (Vmean):   2.11 cm AV Area (VTI):     2.23 cm AV Vmax:           259.60 cm/s AV Vmean:          175.600 cm/s AV VTI:            0.488 m AV Peak Grad:      27.0 mmHg AV Mean Grad:      14.6 mmHg LVOT Vmax:  140.00 cm/s LVOT Vmean:        89.200 cm/s LVOT VTI:          0.262 m LVOT/AV VTI ratio: 0.54 MITRAL VALVE MV Area (PHT): 3.31 cm     SHUNTS MV Area VTI:   3.09 cm     Systemic VTI:  0.26 m MV Peak grad:  9.5 mmHg     Systemic Diam: 2.30 cm MV Mean grad:  3.5 mmHg MV Vmax:       1.54 m/s MV Vmean:      89.7 cm/s MV Decel Time: 229 msec MV E velocity: 104.00 cm/s MV A velocity: 137.00 cm/s MV E/A ratio:  0.76 Dorn Ross MD Electronically signed by Dorn Ross MD Signature Date/Time: 03/28/2024/2:52:05 PM    Final    CT ABDOMEN PELVIS WO CONTRAST Result Date: 03/28/2024 EXAM: CT ABDOMEN AND PELVIS WITHOUT CONTRAST 03/28/2024 12:10:21 PM TECHNIQUE: CT of the abdomen and pelvis was performed without the administration of intravenous contrast. Multiplanar reformatted images are provided for review. Automated exposure control, iterative reconstruction, and/or weight-based adjustment of the mA/kV was utilized to reduce the radiation dose to as low as reasonably achievable. COMPARISON: Abdominopelvic CT of 07/21/2023. CLINICAL HISTORY: Abdominal pain, acute, nonlocalized; abd pain, tenderness, constipation. FINDINGS: LOWER CHEST: Right base subsegmental atelectasis. Mild cardiomegaly with mitral annular calcification. Minimal right pleural fluid. LIVER: The liver is unremarkable. GALLBLADDER AND BILE DUCTS: Gallbladder is unremarkable. Mild common duct dilatation is similar at 11 mm on image 27/2.  SPLEEN: No acute abnormality. PANCREAS: Peripancreatic edema is moderate adjacent to the head on image 29/2, more mild adjacent to the body and tail on image 23/2. No duct dilatation. ADRENAL GLANDS: No acute abnormality. KIDNEYS, URETERS AND BLADDER: No renal calculi, hydronephrosis, bladder or ureteric stone. GI AND BOWEL: Normal stomach, without wall thickening. Normal colon and terminal ileum. Prior enterotomy with surrounding mild small bowel dilatation on image 53/2, likely secondary atony. Ventral abdominal wall hernia repair with residual laxity containing nonobstructing bowel. PERITONEUM AND RETROPERITONEUM: Trace abdominal and small volume pelvic fluid. No free intraperitoneal air. VASCULATURE: Aorta is normal in caliber. Aortic atherosclerosis. LYMPH NODES: No lymphadenopathy. REPRODUCTIVE ORGANS: Hysterectomy. Moderate pelvic floor laxity. BONES AND SOFT TISSUES: Mild degenerative changes of the hips. Trace L4-L5 anterolisthesis. No focal soft tissue abnormality. IMPRESSION: 1. Mild to moderate noncomplicated pancreatitis. 2. Small volume abdominopelvic ascites and trace right pleural fluid. 3. Similar mild common duct dilatation. if the bilirubin is elevated, consider MRCP. 4. . Aortic atherosclerosis (icd10-i70.0). Electronically signed by: Rockey Kilts MD 03/28/2024 01:25 PM EST RP Workstation: [id number]           LOS: 3 days   Time spent= 39 mins    Deliliah Room, MD [facility]  If 7PM-7AM, please contact night-coverage  03/29/2024, 9:47 AM

## 2024-03-29 NOTE — Plan of Care (Signed)
  Problem: Education: Goal: Knowledge of risk factors and measures for prevention of condition will improve Outcome: Progressing   Problem: Coping: Goal: Psychosocial and spiritual needs will be supported Outcome: Not Applicable   Problem: Respiratory: Goal: Will maintain a patent airway Outcome: Not Applicable Goal: Complications related to the disease process, condition or treatment will be avoided or minimized Outcome: Progressing   Problem: Education: Goal: Knowledge of General Education information will improve Description: Including pain rating scale, medication(s)/side effects and non-pharmacologic comfort measures Outcome: Progressing   Problem: Clinical Measurements: Goal: Ability to maintain clinical measurements within normal limits will improve Outcome: Progressing Goal: Will remain free from infection Outcome: Progressing Goal: Diagnostic test results will improve Outcome: Progressing Goal: Respiratory complications will improve Outcome: Progressing Goal: Cardiovascular complication will be avoided Outcome: Not Applicable   Problem: Activity: Goal: Risk for activity intolerance will decrease Outcome: Progressing   Problem: Nutrition: Goal: Adequate nutrition will be maintained Outcome: Progressing   Problem: Coping: Goal: Level of anxiety will decrease Outcome: Progressing   Problem: Elimination: Goal: Will not experience complications related to bowel motility Outcome: Progressing Goal: Will not experience complications related to urinary retention Outcome: Not Applicable   Problem: Pain Managment: Goal: General experience of comfort will improve and/or be controlled Outcome: Progressing   Problem: Safety: Goal: Ability to remain free from injury will improve Outcome: Progressing   Problem: Skin Integrity: Goal: Risk for impaired skin integrity will decrease Outcome: Not Applicable

## 2024-03-29 NOTE — Care Management Important Message (Signed)
 Important Message  Patient Details  Name: [name] [name] MRN: [id number] Date of Birth: 1956/02/27   Important Message Given:  Yes - Medicare IM     Bobbe Quilter L Rafiq Bucklin 03/29/2024, 11:13 AM

## 2024-03-30 ENCOUNTER — Encounter (HOSPITAL_COMMUNITY): Payer: Self-pay | Admitting: Internal Medicine

## 2024-03-30 ENCOUNTER — Inpatient Hospital Stay (HOSPITAL_COMMUNITY)

## 2024-03-30 ENCOUNTER — Other Ambulatory Visit: Payer: Self-pay

## 2024-03-30 DIAGNOSIS — J9621 Acute and chronic respiratory failure with hypoxia: Secondary | ICD-10-CM | POA: Diagnosis not present

## 2024-03-30 MED ORDER — MORPHINE SULFATE (PF) 2 MG/ML IV SOLN
1.0000 mg | Freq: Once | INTRAVENOUS | Status: DC
Start: 2024-03-30 — End: 2024-03-30

## 2024-03-30 MED ORDER — VITAMIN C 500 MG PO TABS
500.0000 mg | ORAL_TABLET | Freq: Two times a day (BID) | ORAL | Status: DC
  Administered 2024-03-30 – 2024-04-03 (×9): 500 mg via ORAL
  Filled 2024-03-30 (×9): qty 1

## 2024-03-30 MED ORDER — MORPHINE SULFATE (PF) 2 MG/ML IV SOLN
1.0000 mg | Freq: Once | INTRAVENOUS | Status: AC | PRN
  Administered 2024-03-31: 1 mg via INTRAVENOUS
  Filled 2024-03-30: qty 1

## 2024-03-30 MED ORDER — CHOLECALCIFEROL 10 MCG (400 UNIT) PO TABS
400.0000 [IU] | ORAL_TABLET | Freq: Every day | ORAL | Status: DC
  Administered 2024-03-30 – 2024-04-03 (×5): 400 [IU] via ORAL
  Filled 2024-03-30 (×5): qty 1

## 2024-03-30 NOTE — Plan of Care (Signed)
  Problem: Education: Goal: Knowledge of risk factors and measures for prevention of condition will improve Outcome: Progressing   Problem: Respiratory: Goal: Complications related to the disease process, condition or treatment will be avoided or minimized Outcome: Progressing   Problem: Education: Goal: Knowledge of General Education information will improve Description: Including pain rating scale, medication(s)/side effects and non-pharmacologic comfort measures Outcome: Progressing   Problem: Health Behavior/Discharge Planning: Goal: Ability to manage health-related needs will improve Outcome: Progressing   Problem: Clinical Measurements: Goal: Ability to maintain clinical measurements within normal limits will improve Outcome: Progressing Goal: Will remain free from infection Outcome: Progressing Goal: Diagnostic test results will improve Outcome: Progressing Goal: Respiratory complications will improve Outcome: Progressing   Problem: Activity: Goal: Risk for activity intolerance will decrease Outcome: Progressing   Problem: Nutrition: Goal: Adequate nutrition will be maintained Outcome: Progressing   Problem: Coping: Goal: Level of anxiety will decrease Outcome: Progressing   Problem: Elimination: Goal: Will not experience complications related to bowel motility Outcome: Progressing   Problem: Pain Managment: Goal: General experience of comfort will improve and/or be controlled Outcome: Progressing   Problem: Safety: Goal: Ability to remain free from injury will improve Outcome: Progressing

## 2024-03-30 NOTE — Progress Notes (Addendum)
 PROGRESS NOTE    [name] [name]  FMW:[id number] DOB: 1955-11-22 DOA: 03/26/2024 PCP: Bevely Doffing, FNP   Brief Narrative:  68 y.o. female with medical history significant of hypertension, hyperlipidemia, hypothyroidism, chronic respiratory failure on 2 LPM of oxygen , COPD, atrial flutter/fibrillation, bipolar disorder presents to the emergency department due to 1 week onset of generalized weakness, she complained of productive cough of thick yellow phlegm, chest congestion, leg buckling, poor oral intake and fever.   Chest x-ray showed  Low lung volumes with mild bibasilar atelectasis. Mild cardiomegaly with mild pulmonary vascular congestion. A mild component of associated interstitial edema cannot be excluded. Being treated for Acute on chronic respiratory failure with hypoxia possibly secondary to COVID-19 virus infection and COPD exacerbation. CT of the abdomen was done because of the abdominal pain and it was for mild to moderate uncomplicated pancreatitis.   Assessment & Plan:  Principal Problem:   Acute on chronic respiratory failure with hypoxia (HCC)    Acute on chronic respiratory failure with hypoxia possibly secondary to COVID-19 virus infection Acute exacerbation of COPD Continue duo nebs, Mucinex  Azithromycin  will be discontinued today (Day 5) Continue Protonix  to prevent steroid-induced ulcer Continue incentive spirometry and flutter valve Changed IV solumedrol to po prednisone  40 mg daily on 11/5 Continue supplemental oxygen  to maintain O2 sat between 88-92%   Elevated proBNP, rule out acute on chronic diastolic CHF proBNP 3,885 Continue total input/output, daily weights and fluid restriction Lasix  temporarily held at this time due to soft BP Continue heart healthy diet      Echocardiogram done on 08/30/2023 showed LVEF of 60 to 65%.  No RWMA.  G1 DD.   Repeat Echocardiogram showed EF of 60 to 65% with grade 1 diastolic dysfunction.   Acute kidney injury,  resolved BUN/creatinine 47/1.53 (baseline creatinine at 0.7-1.0). Cr today is within normal limits. Renally adjust medications, avoid nephrotoxic agents/dehydration/hypotension  Abdominal pain with tenderness: Ordered STAT CT abdomen/pelvis to rule out bowel obstruction/perforation.  It showed evidence of mild to moderate uncomplicated pancreatitis. Prn analgesics It showed mild to moderate uncomplicated pancreatitis. Started on clear liquids S/P soapsuds enema on 03/28/2024 which resulted in a bowel movement.    Presumed CAP, bacterial, POA: Patient was empirically started on IV ceftriaxone  and azithromycin  Chest x-ray was more revealing for pulmonary vascular congestion and interstitial edema Patient was known to be positive for COVID infection   Chronic atrial flutter/fibrillation Continue Eliquis . Temporarily hold Lopressor  due to soft BP   Bipolar disorder Anxiety with depression Continue home Zoloft , Depakote  and BuSpar .   Nicotine  use Continue nicotine  patch She vapes daily   Essential hypertension Hold BP meds at this time due to soft BP   Hyperlipidemia Continue Lipitor  Disposition: Lives at home by herself and uses 2 L nasal oxygen  at all times.  DVT prophylaxis: SCDs Start: 03/27/24 0053 apixaban  (ELIQUIS ) tablet 5 mg     Code Status: Full Code Family Communication: None at the bedside Status is: Inpatient Remains inpatient appropriate because: ARF, COPD exacerbation    Subjective:  She is generally not feeling well.  She received an enema today before yesterday had a bowel movement that day.  She said that on an average, she has an bowel movement once every 3 days.  She is complaining of abdominal pain.  Her breathing is stable and she is requiring 3 L oxygen .  She was sitting at the edge of the bed this morning.  She said that she is scared to try to eat because  it may cause worsening of her nausea and abdominal pain.  Examination:  General exam:  Appears to be in mild distress, 3 L nasal oxygen  in place Respiratory system: diminished breath sounds bilaterally Cardiovascular system: S1 & S2 heard, RRR. No JVD, murmurs, rubs, gallops or clicks. No pedal edema. Gastrointestinal system: Abdomen is slightly distended, tender to palpation in lower quadrants Central nervous system: Alert and oriented. No focal neurological deficits. Extremities: Symmetric 5 x 5 power. Skin: No rashes, lesions or ulcers Psychiatry: Judgement and insight appear normal. Mood & affect appropriate.       Diet Orders (From admission, onward)     Start     Ordered   03/30/24 0732  Diet full liquid Room service appropriate? Yes; Fluid consistency: Thin  Diet effective now       Question Answer Comment  Room service appropriate? Yes   Fluid consistency: Thin      03/30/24 0731            Objective: Vitals:   03/29/24 2008 03/30/24 0315 03/30/24 0321 03/30/24 0411  BP: 123/66   (!) (P) 153/61  Pulse: 87  87 (P) 73  Resp: 14  16 (P) 16  Temp: 98.3 F (36.8 C)  98.6 F (37 C) (P) 98.6 F (37 C)  TempSrc: Oral  Oral (P) Oral  SpO2: 95%  94% (P) 94%  Weight:  83.1 kg      Intake/Output Summary (Last 24 hours) at 03/30/2024 0851 Last data filed at 03/30/2024 0438 Gross per 24 hour  Intake 240 ml  Output 200 ml  Net 40 ml   Filed Weights   03/28/24 1937 03/29/24 0358 03/30/24 0315  Weight: 92.3 kg 92.3 kg 83.1 kg    Scheduled Meds:  apixaban   5 mg Oral BID   atorvastatin   20 mg Oral QHS   azithromycin   250 mg Oral Daily   busPIRone   10 mg Oral TID   dextromethorphan -guaiFENesin   1 tablet Oral BID   divalproex   500 mg Oral BID   feeding supplement  237 mL Oral BID BM   ipratropium-albuterol   3 mL Nebulization BID   multivitamin with minerals  1 tablet Oral Daily   nicotine   21 mg Transdermal Daily   pantoprazole   40 mg Oral Daily   predniSONE   40 mg Oral Q breakfast   sertraline   200 mg Oral Daily   Continuous  Infusions:  Nutritional status Signs/Symptoms: estimated needs Interventions: Ensure Enlive (each supplement provides 350kcal and 20 grams of protein), MVI, Liberalize Diet Body mass index is 30.49 kg/m.  Data Reviewed:   CBC: Recent Labs  Lab 03/26/24 1728 03/27/24 0419 03/29/24 0441  WBC 4.5 3.4* 5.4  NEUTROABS 2.8  --  4.1  HGB 9.8* 9.5* 9.7*  HCT 31.9* 30.8* 31.2*  MCV 93.0 91.7 93.4  PLT 159 143* 145*   Basic Metabolic Panel: Recent Labs  Lab 03/26/24 1728 03/27/24 0419 03/29/24 0441  NA 143 142 142  K 4.4 4.6 4.3  CL 101 101 102  CO2 32 31 34*  GLUCOSE 101* 152* 80  BUN 47* 50* 53*  CREATININE 1.53* 1.20* 0.85  CALCIUM  8.9 8.9 8.9  MG 2.2 2.3  --   PHOS  --  4.3  --    GFR: Estimated Creatinine Clearance: 68.3 mL/min (by C-G formula based on SCr of 0.85 mg/dL). Liver Function Tests: Recent Labs  Lab 03/26/24 1728 03/27/24 0419  AST <10* <10*  ALT <5 5  ALKPHOS 62 62  BILITOT 0.3 <0.2  PROT 7.2 7.2  ALBUMIN 3.5 3.4*   Recent Labs  Lab 03/28/24 1639  LIPASE 86*   No results for input(s): AMMONIA in the last 168 hours. Coagulation Profile: No results for input(s): INR, PROTIME in the last 168 hours. Cardiac Enzymes: No results for input(s): CKTOTAL, CKMB, CKMBINDEX, TROPONINI in the last 168 hours. BNP (last 3 results) Recent Labs    03/26/24 1728  PROBNP 3,885.0*   HbA1C: No results for input(s): HGBA1C in the last 72 hours. CBG: No results for input(s): GLUCAP in the last 168 hours. Lipid Profile: No results for input(s): CHOL, HDL, LDLCALC, TRIG, CHOLHDL, LDLDIRECT in the last 72 hours. Thyroid Function Tests: No results for input(s): TSH, T4TOTAL, FREET4, T3FREE, THYROIDAB in the last 72 hours. Anemia Panel: No results for input(s): VITAMINB12, FOLATE, FERRITIN, TIBC, IRON, RETICCTPCT in the last 72 hours. Sepsis Labs: Recent Labs  Lab 03/27/24 0419  PROCALCITON <0.10     Recent Results (from the past 240 hours)  Resp panel by RT-PCR (RSV, Flu A&B, Covid) Anterior Nasal Swab     Status: Abnormal   Collection Time: 03/26/24  5:06 PM   Specimen: Anterior Nasal Swab  Result Value Ref Range Status   SARS Coronavirus 2 by RT PCR POSITIVE (A) NEGATIVE Final    Comment: (NOTE) SARS-CoV-2 target nucleic acids are DETECTED.  The SARS-CoV-2 RNA is generally detectable in upper respiratory specimens during the acute phase of infection. Positive results are indicative of the presence of the identified virus, but do not rule out bacterial infection or co-infection with other pathogens not detected by the test. Clinical correlation with patient history and other diagnostic information is necessary to determine patient infection status. The expected result is Negative.  Fact Sheet for Patients: bloggercourse.com  Fact Sheet for Healthcare Providers: seriousbroker.it  This test is not yet approved or cleared by the [place]  FDA and  has been authorized for detection and/or diagnosis of SARS-CoV-2 by FDA under an Emergency Use Authorization (EUA).  This EUA will remain in effect (meaning this test can be used) for the duration of  the COVID-19 declaration under Section 564(b)(1) of the A ct, 21 U.S.C. section 360bbb-3(b)(1), unless the authorization is terminated or revoked sooner.     Influenza A by PCR NEGATIVE NEGATIVE Final   Influenza B by PCR NEGATIVE NEGATIVE Final    Comment: (NOTE) The Xpert Xpress SARS-CoV-2/FLU/RSV plus assay is intended as an aid in the diagnosis of influenza from Nasopharyngeal swab specimens and should not be used as a sole basis for treatment. Nasal washings and aspirates are unacceptable for Xpert Xpress SARS-CoV-2/FLU/RSV testing.  Fact Sheet for Patients: bloggercourse.com  Fact Sheet for Healthcare  Providers: seriousbroker.it  This test is not yet approved or cleared by the [place]  FDA and has been authorized for detection and/or diagnosis of SARS-CoV-2 by FDA under an Emergency Use Authorization (EUA). This EUA will remain in effect (meaning this test can be used) for the duration of the COVID-19 declaration under Section 564(b)(1) of the Act, 21 U.S.C. section 360bbb-3(b)(1), unless the authorization is terminated or revoked.     Resp Syncytial Virus by PCR NEGATIVE NEGATIVE Final    Comment: (NOTE) Fact Sheet for Patients: bloggercourse.com  Fact Sheet for Healthcare Providers: seriousbroker.it  This test is not yet approved or cleared by the [place]  FDA and has been authorized for detection and/or diagnosis of SARS-CoV-2 by FDA under an Emergency Use Authorization (EUA). This EUA will remain  in effect (meaning this test can be used) for the duration of the COVID-19 declaration under Section 564(b)(1) of the Act, 21 U.S.C. section 360bbb-3(b)(1), unless the authorization is terminated or revoked.  Performed at [facility], [address], [place], [place] [address]          Radiology Studies: ECHOCARDIOGRAM COMPLETE Result Date: 03/28/2024    ECHOCARDIOGRAM REPORT   Patient Name:   BRIYANNA BILLINGHAM Date of Exam: 03/28/2024 Medical Rec #:  [id number]         Height:       65.0 in Accession #:    [id number]        Weight:       200.6 lb Date of Birth:  1956/02/17         BSA:          1.981 m Patient Age:    68 years          BP:           124/66 mmHg Patient Gender: F                 HR:           82 bpm. Exam Location:  [facility] Procedure: 2D Echo, Cardiac Doppler and Color Doppler (Both Spectral and Color            Flow Doppler were utilized during procedure). Indications:    R06.02 SOB; I50.40* Unspecified combined systolic (congestive)                 and diastolic (congestive)  heart failure  History:        Patient has prior history of Echocardiogram examinations, most                 recent 09/09/2023. Abnormal ECG, COPD, Aortic Valve Disease and                 Mitral Valve Disease, Arrythmias:Atrial Flutter; Risk                 Factors:Former Smoker, Hypertension, Diabetes and Dyslipidemia.  Sonographer:    Ellouise Mose RDCS Referring Phys: [id number] [facility] [name] [name]  Sonographer Comments: Suboptimal parasternal window and Technically difficult study due to poor echo windows. Covid positive. Attempted to turn. IMPRESSIONS  1. Left ventricular ejection fraction, by estimation, is 60 to 65%. The left ventricle has normal function. Left ventricular endocardial border not optimally defined to evaluate regional wall motion. Left ventricular diastolic parameters are consistent with Grade I diastolic dysfunction (impaired relaxation). Elevated left atrial pressure.  2. Right ventricular systolic function is normal. The right ventricular size is normal.  3. Left atrial size was mildly dilated.  4. The mitral valve is degenerative. Mild to moderate mitral valve regurgitation. Mild mitral stenosis. The mean mitral valve gradient is 3.5 mmHg. Severe mitral annular calcification.  5. The aortic valve was not well visualized. Aortic valve regurgitation is not visualized. Mild aortic valve stenosis. Aortic valve area, by VTI measures 2.23 cm. Aortic valve mean gradient measures 14.6 mmHg. Aortic valve Vmax measures 2.60 m/s.  6. The inferior vena cava is normal in size with greater than 50% respiratory variability, suggesting right atrial pressure of 3 mmHg. FINDINGS  Left Ventricle: Left ventricular ejection fraction, by estimation, is 60 to 65%. The left ventricle has normal function. Left ventricular endocardial border not optimally defined to evaluate regional wall motion. The left ventricular internal cavity size was normal in size. There is no left  ventricular hypertrophy. Left ventricular diastolic  parameters are consistent with Grade I diastolic dysfunction (impaired relaxation). Elevated left atrial pressure. Right Ventricle: The right ventricular size is normal. No increase in right ventricular wall thickness. Right ventricular systolic function is normal. Left Atrium: Left atrial size was mildly dilated. Right Atrium: Right atrial size was normal in size. Pericardium: There is no evidence of pericardial effusion. Mitral Valve: The mitral valve is degenerative in appearance. There is moderate thickening of the mitral valve leaflet(s). There is moderate calcification of the mitral valve leaflet(s). Severe mitral annular calcification. Mild to moderate mitral valve regurgitation. Mild mitral valve stenosis. MV peak gradient, 9.5 mmHg. The mean mitral valve gradient is 3.5 mmHg. Tricuspid Valve: The tricuspid valve is normal in structure. Tricuspid valve regurgitation is not demonstrated. No evidence of tricuspid stenosis. Aortic Valve: The aortic valve was not well visualized. Aortic valve regurgitation is not visualized. Mild aortic stenosis is present. Aortic valve mean gradient measures 14.6 mmHg. Aortic valve peak gradient measures 27.0 mmHg. Aortic valve area, by VTI  measures 2.23 cm. Pulmonic Valve: The pulmonic valve was not well visualized. Pulmonic valve regurgitation is not visualized. No evidence of pulmonic stenosis. Aorta: The aortic root was not well visualized. Venous: The inferior vena cava is normal in size with greater than 50% respiratory variability, suggesting right atrial pressure of 3 mmHg. IAS/Shunts: The interatrial septum was not well visualized.  LEFT VENTRICLE PLAX 2D LVIDd:         4.90 cm     Diastology LVIDs:         3.30 cm     LV e' medial:    4.79 cm/s LV PW:         0.90 cm     LV E/e' medial:  21.7 LV IVS:        1.00 cm     LV e' lateral:   4.90 cm/s LVOT diam:     2.30 cm     LV E/e' lateral: 21.2 LV SV:         109 LV SV Index:   55 LVOT Area:     4.15 cm  LV Volumes  (MOD) LV vol d, MOD A2C: 35.3 ml LV vol d, MOD A4C: 50.7 ml LV vol s, MOD A2C: 12.4 ml LV vol s, MOD A4C: 16.0 ml LV SV MOD A2C:     22.9 ml LV SV MOD A4C:     50.7 ml LV SV MOD BP:      28.6 ml RIGHT VENTRICLE            IVC RV S prime:     9.90 cm/s  IVC diam: 1.40 cm TAPSE (M-mode): 1.5 cm LEFT ATRIUM             Index        RIGHT ATRIUM           Index LA diam:        3.60 cm 1.82 cm/m   RA Area:     13.50 cm LA Vol (A2C):   70.7 ml 35.69 ml/m  RA Volume:   28.80 ml  14.54 ml/m LA Vol (A4C):   71.6 ml 36.15 ml/m LA Biplane Vol: 70.8 ml 35.74 ml/m  AORTIC VALVE AV Area (Vmax):    2.24 cm AV Area (Vmean):   2.11 cm AV Area (VTI):     2.23 cm AV Vmax:           259.60 cm/s AV  Vmean:          175.600 cm/s AV VTI:            0.488 m AV Peak Grad:      27.0 mmHg AV Mean Grad:      14.6 mmHg LVOT Vmax:         140.00 cm/s LVOT Vmean:        89.200 cm/s LVOT VTI:          0.262 m LVOT/AV VTI ratio: 0.54 MITRAL VALVE MV Area (PHT): 3.31 cm     SHUNTS MV Area VTI:   3.09 cm     Systemic VTI:  0.26 m MV Peak grad:  9.5 mmHg     Systemic Diam: 2.30 cm MV Mean grad:  3.5 mmHg MV Vmax:       1.54 m/s MV Vmean:      89.7 cm/s MV Decel Time: 229 msec MV E velocity: 104.00 cm/s MV A velocity: 137.00 cm/s MV E/A ratio:  0.76 Dorn Ross MD Electronically signed by Dorn Ross MD Signature Date/Time: 03/28/2024/2:52:05 PM    Final    CT ABDOMEN PELVIS WO CONTRAST Result Date: 03/28/2024 EXAM: CT ABDOMEN AND PELVIS WITHOUT CONTRAST 03/28/2024 12:10:21 PM TECHNIQUE: CT of the abdomen and pelvis was performed without the administration of intravenous contrast. Multiplanar reformatted images are provided for review. Automated exposure control, iterative reconstruction, and/or weight-based adjustment of the mA/kV was utilized to reduce the radiation dose to as low as reasonably achievable. COMPARISON: Abdominopelvic CT of 07/21/2023. CLINICAL HISTORY: Abdominal pain, acute, nonlocalized; abd pain, tenderness,  constipation. FINDINGS: LOWER CHEST: Right base subsegmental atelectasis. Mild cardiomegaly with mitral annular calcification. Minimal right pleural fluid. LIVER: The liver is unremarkable. GALLBLADDER AND BILE DUCTS: Gallbladder is unremarkable. Mild common duct dilatation is similar at 11 mm on image 27/2. SPLEEN: No acute abnormality. PANCREAS: Peripancreatic edema is moderate adjacent to the head on image 29/2, more mild adjacent to the body and tail on image 23/2. No duct dilatation. ADRENAL GLANDS: No acute abnormality. KIDNEYS, URETERS AND BLADDER: No renal calculi, hydronephrosis, bladder or ureteric stone. GI AND BOWEL: Normal stomach, without wall thickening. Normal colon and terminal ileum. Prior enterotomy with surrounding mild small bowel dilatation on image 53/2, likely secondary atony. Ventral abdominal wall hernia repair with residual laxity containing nonobstructing bowel. PERITONEUM AND RETROPERITONEUM: Trace abdominal and small volume pelvic fluid. No free intraperitoneal air. VASCULATURE: Aorta is normal in caliber. Aortic atherosclerosis. LYMPH NODES: No lymphadenopathy. REPRODUCTIVE ORGANS: Hysterectomy. Moderate pelvic floor laxity. BONES AND SOFT TISSUES: Mild degenerative changes of the hips. Trace L4-L5 anterolisthesis. No focal soft tissue abnormality. IMPRESSION: 1. Mild to moderate noncomplicated pancreatitis. 2. Small volume abdominopelvic ascites and trace right pleural fluid. 3. Similar mild common duct dilatation. if the bilirubin is elevated, consider MRCP. 4. . Aortic atherosclerosis (icd10-i70.0). Electronically signed by: Rockey Kilts MD 03/28/2024 01:25 PM EST RP Workstation: [id number]          LOS: 4 days   Time spent= 39 mins    Deliliah Room, MD [facility]  If 7PM-7AM, please contact night-coverage  03/30/2024, 8:51 AM

## 2024-03-30 NOTE — Plan of Care (Signed)
  Problem: Health Behavior/Discharge Planning: Goal: Ability to manage health-related needs will improve Outcome: Progressing   Problem: Clinical Measurements: Goal: Will remain free from infection Outcome: Progressing   Problem: Activity: Goal: Risk for activity intolerance will decrease Outcome: Progressing   Problem: Coping: Goal: Level of anxiety will decrease Outcome: Progressing   Problem: Pain Managment: Goal: General experience of comfort will improve and/or be controlled Outcome: Progressing   Problem: Safety: Goal: Ability to remain free from injury will improve Outcome: Progressing

## 2024-03-31 DIAGNOSIS — J9621 Acute and chronic respiratory failure with hypoxia: Secondary | ICD-10-CM | POA: Diagnosis not present

## 2024-03-31 MED ORDER — MORPHINE SULFATE (PF) 2 MG/ML IV SOLN
1.0000 mg | Freq: Once | INTRAVENOUS | Status: AC
  Administered 2024-03-31: 1 mg via INTRAVENOUS
  Filled 2024-03-31: qty 1

## 2024-03-31 NOTE — Progress Notes (Signed)
 PROGRESS NOTE    [name] [name]  FMW:[id number] DOB: October 31, 1955 DOA: 03/26/2024 PCP: Bevely Doffing, FNP   Brief Narrative:  68 y.o. female with medical history significant of hypertension, hyperlipidemia, hypothyroidism, chronic respiratory failure on 2 LPM of oxygen , COPD, atrial flutter/fibrillation, bipolar disorder presents to the emergency department due to 1 week onset of generalized weakness, she complained of productive cough of thick yellow phlegm, chest congestion, leg buckling, poor oral intake and fever.   Chest x-ray showed  Low lung volumes with mild bibasilar atelectasis. Mild cardiomegaly with mild pulmonary vascular congestion. A mild component of associated interstitial edema cannot be excluded. Being treated for Acute on chronic respiratory failure with hypoxia possibly secondary to COVID-19 virus infection and COPD exacerbation. CT of the abdomen was done because of the abdominal pain and it was for mild to moderate uncomplicated pancreatitis.   Assessment & Plan:  Principal Problem:   Acute on chronic respiratory failure with hypoxia (HCC)    Acute on chronic respiratory failure with hypoxia possibly secondary to COVID-19 virus infection Acute exacerbation of COPD Continue duo nebs, Mucinex  Azithromycin  discontinued on 11/8 after 5 day course. Continue Protonix  to prevent steroid-induced ulcer Continue incentive spirometry and flutter valve Changed IV solumedrol to po prednisone  40 mg daily on 11/5 Continue supplemental oxygen  to maintain O2 sat between 88-92% 6 minutes walk test ordered today   Elevated proBNP, rule out acute on chronic diastolic CHF proBNP 3,885 Continue total input/output, daily weights and fluid restriction Lasix  held Continue heart healthy diet      Echocardiogram done on 08/30/2023 showed LVEF of 60 to 65%.  No RWMA.  G1 DD.   Repeat Echocardiogram showed EF of 60 to 65% with grade 1 diastolic dysfunction.   Acute kidney injury,  resolved BUN/creatinine 47/1.53 (baseline creatinine at 0.7-1.0). Cr today is within normal limits. Renally adjust medications, avoid nephrotoxic agents/dehydration/hypotension  Abdominal pain with tenderness: Ordered STAT CT abdomen/pelvis to rule out bowel obstruction/perforation.  It showed evidence of mild to moderate uncomplicated pancreatitis. Prn analgesics It showed mild to moderate uncomplicated pancreatitis. Diet advanced to mechanical soft diet on 03/31/2024 S/P soapsuds enema on 03/28/2024 which resulted in a bowel movement.    Presumed CAP, bacterial, POA: Patient was empirically started on IV ceftriaxone  and azithromycin  Chest x-ray was more revealing for pulmonary vascular congestion and interstitial edema Patient was known to be positive for COVID infection   Chronic atrial flutter/fibrillation Continue Eliquis . Temporarily hold Lopressor  due to soft BP   Bipolar disorder Anxiety with depression Continue home Zoloft , Depakote  and BuSpar .   Nicotine  use Continue nicotine  patch She vapes daily   Essential hypertension Hold BP meds at this time due to soft BP   Hyperlipidemia Continue Lipitor  Disposition: Lives at home by herself and uses 2 L nasal oxygen  at all times.  DVT prophylaxis: SCDs Start: 03/27/24 0053 apixaban  (ELIQUIS ) tablet 5 mg     Code Status: Full Code Family Communication: None at the bedside Status is: Inpatient Remains inpatient appropriate because: ARF, COPD exacerbation    Subjective:  She is feeling better today.  She said that she had small bowel movement yesterday.  She did walk some yesterday.  Her abdominal pain has improved.  She wants her diet to be advanced today.  She said that she had potato soup.  Talked about making her walk for 6 minutes and checking her oxygen  saturations while walking.  I also encouraged that she sits in the chair as long as she can.  Examination:  General exam: Appears alert and awake, 3 L nasal  oxygen  in place Respiratory system: diminished breath sounds bilaterally Cardiovascular system: S1 & S2 heard, RRR. No JVD, murmurs, rubs, gallops or clicks. No pedal edema. Gastrointestinal system: Abdomen is slightly distended, tender to palpation in lower quadrants, improved as compared to prior Central nervous system: Alert and oriented. No focal neurological deficits. Extremities: Symmetric 5 x 5 power. Skin: No rashes, lesions or ulcers Psychiatry: Judgement and insight appear normal. Mood & affect appropriate.       Diet Orders (From admission, onward)     Start     Ordered   03/31/24 0907  DIET DYS 3 Room service appropriate? Yes; Fluid consistency: Thin  Diet effective now       Question Answer Comment  Room service appropriate? Yes   Fluid consistency: Thin      03/31/24 0906            Objective: Vitals:   03/30/24 2040 03/30/24 2044 03/31/24 0451 03/31/24 0524  BP: (!) 148/80  (!) 158/70   Pulse: 65  73   Resp: 20  14   Temp: 97.8 F (36.6 C)  98.9 F (37.2 C)   TempSrc: Oral  Oral   SpO2: 93% 93% 97%   Weight:    82.6 kg  Height:        Intake/Output Summary (Last 24 hours) at 03/31/2024 0906 Last data filed at 03/31/2024 0500 Gross per 24 hour  Intake 840 ml  Output --  Net 840 ml   Filed Weights   03/29/24 0358 03/30/24 0315 03/31/24 0524  Weight: 92.3 kg 83.1 kg 82.6 kg    Scheduled Meds:  apixaban   5 mg Oral BID   ascorbic acid  500 mg Oral BID   atorvastatin   20 mg Oral QHS   busPIRone   10 mg Oral TID   cholecalciferol  400 Units Oral Daily   dextromethorphan -guaiFENesin   1 tablet Oral BID   divalproex   500 mg Oral BID   feeding supplement  237 mL Oral BID BM   ipratropium-albuterol   3 mL Nebulization BID   multivitamin with minerals  1 tablet Oral Daily   nicotine   21 mg Transdermal Daily   pantoprazole   40 mg Oral Daily   predniSONE   40 mg Oral Q breakfast   sertraline   200 mg Oral Daily   Continuous Infusions:  Nutritional  status Signs/Symptoms: estimated needs Interventions: Ensure Enlive (each supplement provides 350kcal and 20 grams of protein), MVI, Liberalize Diet Body mass index is 30.3 kg/m.  Data Reviewed:   CBC: Recent Labs  Lab 03/26/24 1728 03/27/24 0419 03/29/24 0441  WBC 4.5 3.4* 5.4  NEUTROABS 2.8  --  4.1  HGB 9.8* 9.5* 9.7*  HCT 31.9* 30.8* 31.2*  MCV 93.0 91.7 93.4  PLT 159 143* 145*   Basic Metabolic Panel: Recent Labs  Lab 03/26/24 1728 03/27/24 0419 03/29/24 0441  NA 143 142 142  K 4.4 4.6 4.3  CL 101 101 102  CO2 32 31 34*  GLUCOSE 101* 152* 80  BUN 47* 50* 53*  CREATININE 1.53* 1.20* 0.85  CALCIUM  8.9 8.9 8.9  MG 2.2 2.3  --   PHOS  --  4.3  --    GFR: Estimated Creatinine Clearance: 68.1 mL/min (by C-G formula based on SCr of 0.85 mg/dL). Liver Function Tests: Recent Labs  Lab 03/26/24 1728 03/27/24 0419  AST <10* <10*  ALT <5 5  ALKPHOS 62 62  BILITOT  0.3 <0.2  PROT 7.2 7.2  ALBUMIN 3.5 3.4*   Recent Labs  Lab 03/28/24 1639  LIPASE 86*   No results for input(s): AMMONIA in the last 168 hours. Coagulation Profile: No results for input(s): INR, PROTIME in the last 168 hours. Cardiac Enzymes: No results for input(s): CKTOTAL, CKMB, CKMBINDEX, TROPONINI in the last 168 hours. BNP (last 3 results) Recent Labs    03/26/24 1728  PROBNP 3,885.0*   HbA1C: No results for input(s): HGBA1C in the last 72 hours. CBG: No results for input(s): GLUCAP in the last 168 hours. Lipid Profile: No results for input(s): CHOL, HDL, LDLCALC, TRIG, CHOLHDL, LDLDIRECT in the last 72 hours. Thyroid Function Tests: No results for input(s): TSH, T4TOTAL, FREET4, T3FREE, THYROIDAB in the last 72 hours. Anemia Panel: No results for input(s): VITAMINB12, FOLATE, FERRITIN, TIBC, IRON, RETICCTPCT in the last 72 hours. Sepsis Labs: Recent Labs  Lab 03/27/24 0419  PROCALCITON <0.10    Recent Results (from the  past 240 hours)  Resp panel by RT-PCR (RSV, Flu A&B, Covid) Anterior Nasal Swab     Status: Abnormal   Collection Time: 03/26/24  5:06 PM   Specimen: Anterior Nasal Swab  Result Value Ref Range Status   SARS Coronavirus 2 by RT PCR POSITIVE (A) NEGATIVE Final    Comment: (NOTE) SARS-CoV-2 target nucleic acids are DETECTED.  The SARS-CoV-2 RNA is generally detectable in upper respiratory specimens during the acute phase of infection. Positive results are indicative of the presence of the identified virus, but do not rule out bacterial infection or co-infection with other pathogens not detected by the test. Clinical correlation with patient history and other diagnostic information is necessary to determine patient infection status. The expected result is Negative.  Fact Sheet for Patients: bloggercourse.com  Fact Sheet for Healthcare Providers: seriousbroker.it  This test is not yet approved or cleared by the [place]  FDA and  has been authorized for detection and/or diagnosis of SARS-CoV-2 by FDA under an Emergency Use Authorization (EUA).  This EUA will remain in effect (meaning this test can be used) for the duration of  the COVID-19 declaration under Section 564(b)(1) of the A ct, 21 U.S.C. section 360bbb-3(b)(1), unless the authorization is terminated or revoked sooner.     Influenza A by PCR NEGATIVE NEGATIVE Final   Influenza B by PCR NEGATIVE NEGATIVE Final    Comment: (NOTE) The Xpert Xpress SARS-CoV-2/FLU/RSV plus assay is intended as an aid in the diagnosis of influenza from Nasopharyngeal swab specimens and should not be used as a sole basis for treatment. Nasal washings and aspirates are unacceptable for Xpert Xpress SARS-CoV-2/FLU/RSV testing.  Fact Sheet for Patients: bloggercourse.com  Fact Sheet for Healthcare Providers: seriousbroker.it  This test is  not yet approved or cleared by the [place]  FDA and has been authorized for detection and/or diagnosis of SARS-CoV-2 by FDA under an Emergency Use Authorization (EUA). This EUA will remain in effect (meaning this test can be used) for the duration of the COVID-19 declaration under Section 564(b)(1) of the Act, 21 U.S.C. section 360bbb-3(b)(1), unless the authorization is terminated or revoked.     Resp Syncytial Virus by PCR NEGATIVE NEGATIVE Final    Comment: (NOTE) Fact Sheet for Patients: bloggercourse.com  Fact Sheet for Healthcare Providers: seriousbroker.it  This test is not yet approved or cleared by the [place]  FDA and has been authorized for detection and/or diagnosis of SARS-CoV-2 by FDA under an Emergency Use Authorization (EUA). This EUA will remain in  effect (meaning this test can be used) for the duration of the COVID-19 declaration under Section 564(b)(1) of the Act, 21 U.S.C. section 360bbb-3(b)(1), unless the authorization is terminated or revoked.  Performed at [facility], [address] [place], [place] [address]          Radiology Studies: DG CHEST PORT 1 VIEW Result Date: 03/30/2024 CLINICAL DATA:  Hypoxia.  Shortness of breath. EXAM: PORTABLE CHEST 1 VIEW COMPARISON:  Radiograph 03/27/2023. Lung bases from abdominopelvic CT 03/28/2024 FINDINGS: Stable heart size and mediastinal contours. Small right pleural effusion with subsegmental atelectasis at the right lung base, similar to recent CT. Increasing atelectasis at the left lung base. Background bronchial thickening. Improved vascular congestion. No pneumothorax. Remote left rib fracture. IMPRESSION: 1. Small right pleural effusion with subsegmental atelectasis at the right lung base, similar to recent CT. 2. Increasing atelectasis at the left lung base. 3. Improved vascular congestion. Electronically Signed   By: Andrea Gasman M.D.   On:  03/30/2024 10:44          LOS: 5 days   Time spent= 39 mins    Deliliah Room, MD [facility]  If 7PM-7AM, please contact night-coverage  03/31/2024, 9:06 AM

## 2024-04-01 ENCOUNTER — Telehealth: Payer: Self-pay

## 2024-04-01 DIAGNOSIS — J9621 Acute and chronic respiratory failure with hypoxia: Secondary | ICD-10-CM | POA: Diagnosis not present

## 2024-04-01 LAB — BASIC METABOLIC PANEL WITH GFR
Anion gap: 7 (ref 5–15)
BUN: 30 mg/dL — ABNORMAL HIGH (ref 8–23)
CO2: 33 mmol/L — ABNORMAL HIGH (ref 22–32)
Calcium: 9.1 mg/dL (ref 8.9–10.3)
Chloride: 103 mmol/L (ref 98–111)
Creatinine, Ser: 0.66 mg/dL (ref 0.44–1.00)
GFR, Estimated: >60 mL/min (ref >60)
Glucose, Bld: 91 mg/dL (ref 70–99)
Potassium: 4 mmol/L (ref 3.5–5.1)
Sodium: 143 mmol/L (ref 135–145)

## 2024-04-01 LAB — CBC WITH DIFFERENTIAL/PLATELET
Abs Immature Granulocytes: 0.18 K/uL — ABNORMAL HIGH (ref 0.00–0.07)
Basophils Absolute: 0.1 K/uL (ref 0.0–0.1)
Basophils Relative: 1 %
Eosinophils Absolute: 0.1 K/uL (ref 0.0–0.5)
Eosinophils Relative: 1 %
HCT: 36.7 % (ref 36.0–46.0)
Hemoglobin: 11.6 g/dL — ABNORMAL LOW (ref 12.0–15.0)
Immature Granulocytes: 3 %
Lymphocytes Relative: 27 %
Lymphs Abs: 1.8 K/uL (ref 0.7–4.0)
MCH: 28.4 pg (ref 26.0–34.0)
MCHC: 31.6 g/dL (ref 30.0–36.0)
MCV: 90 fL (ref 80.0–100.0)
Monocytes Absolute: 0.5 K/uL (ref 0.1–1.0)
Monocytes Relative: 8 %
Neutro Abs: 4 K/uL (ref 1.7–7.7)
Neutrophils Relative %: 60 %
Platelets: 146 K/uL — ABNORMAL LOW (ref 150–400)
RBC: 4.08 MIL/uL (ref 3.87–5.11)
RDW: 12.6 % (ref 11.5–15.5)
WBC: 6.6 K/uL (ref 4.0–10.5)
nRBC: 0 % (ref 0.0–0.2)

## 2024-04-01 MED ORDER — MORPHINE SULFATE (PF) 2 MG/ML IV SOLN
1.0000 mg | INTRAVENOUS | Status: DC | PRN
  Administered 2024-04-01 – 2024-04-02 (×5): 1 mg via INTRAVENOUS
  Filled 2024-04-01 (×5): qty 1

## 2024-04-01 NOTE — Telephone Encounter (Signed)
 Copied from CRM #[id number]. Topic: Appointments - Appointment Scheduling >> Apr 01, 2024  8:16 AM Tiffany B wrote: Patient is currently at [facility] and will be discharged today by 4pm. [facility] provider advised for patient to follow up with PCP tomorrow to discuss her pancreatitis. Please call back patient daughter [name] today to schedule a hospital follow up. No available appointments for tomorrow, caller requesting a work in.

## 2024-04-01 NOTE — Telephone Encounter (Signed)
 Called patient, patient not being discharged today.

## 2024-04-01 NOTE — TOC Progression Note (Signed)
 Transition of Care [facility]) - Progression Note    Patient Details  Name: [name] [name] MRN: [id number] Date of Birth: 03-05-1956  Transition of Care [facility]) CM/SW Contact  [place] Saddie Kim, [place] Phone Number: 04/01/2024, 9:24 AM  Clinical Narrative:  LCSW discussed possibility of home health with pt. She states she is at baseline and does not feel this is necessary. TOC will follow.      Expected Discharge Plan: Home/Self Care Barriers to Discharge: Continued Medical Work up               Expected Discharge Plan and Services In-house Referral: NA Discharge Planning Services: NA Post Acute Care Choice: NA Living arrangements for the past 2 months: Apartment                 DME Arranged: N/A DME Agency: NA                   Social Drivers of Health (SDOH) Interventions SDOH Screenings   Food Insecurity: No Food Insecurity (03/26/2024)  Housing: Low Risk  (03/26/2024)  Transportation Needs: No Transportation Needs (03/26/2024)  Utilities: Not At Risk (03/26/2024)  Depression (PHQ2-9): Medium Risk (03/15/2024)  Social Connections: Unknown (03/26/2024)  Tobacco Use: Medium Risk (03/30/2024)    Readmission Risk Interventions    03/30/2024    9:30 AM 03/27/2024   10:30 AM 09/16/2022    1:33 PM  Readmission Risk Prevention Plan  Transportation Screening Complete Complete Complete  PCP or Specialist Appt within 3-5 Days  Complete   HRI or Home Care Consult Complete Complete Complete  Social Work Consult for Recovery Care Planning/Counseling Complete Complete Complete  Palliative Care Screening Not Applicable Not Applicable Not Applicable  Medication Review Oceanographer) Complete Complete Complete

## 2024-04-01 NOTE — Progress Notes (Signed)
 PROGRESS NOTE    [name] [name]  FMW:[id number] DOB: 1956/03/06 DOA: 03/26/2024 PCP: Bevely Doffing, FNP   Brief Narrative:  68 y.o. female with medical history significant of hypertension, hyperlipidemia, hypothyroidism, chronic respiratory failure on 2 LPM of oxygen , COPD, atrial flutter/fibrillation, bipolar disorder presents to the emergency department due to 1 week onset of generalized weakness, she complained of productive cough of thick yellow phlegm, chest congestion, leg buckling, poor oral intake and fever.   Chest x-ray showed  Low lung volumes with mild bibasilar atelectasis. Mild cardiomegaly with mild pulmonary vascular congestion. A mild component of associated interstitial edema cannot be excluded. Being treated for Acute on chronic respiratory failure with hypoxia possibly secondary to COVID-19 virus infection and COPD exacerbation. CT of the abdomen was done because of the abdominal pain and it was for mild to moderate uncomplicated pancreatitis.   Assessment & Plan:  Principal Problem:   Acute on chronic respiratory failure with hypoxia (HCC)    Acute on chronic respiratory failure with hypoxia possibly secondary to COVID-19 virus infection Acute exacerbation of COPD Continue duo nebs, Mucinex  Azithromycin  discontinued on 11/8 after 5 day course. Continue Protonix  to prevent steroid-induced ulcer Continue incentive spirometry and flutter valve Changed IV solumedrol to po prednisone  40 mg daily on 11/5. Dced steroids on 11/10. Continue supplemental oxygen  to maintain O2 sat between 88-92% 6 minutes walk test ordered She does have oxygen  cylinder and concentrator at home along with nebulization machine.  She is dependent on 2 L/min nasal cannula at home at all times.   Elevated proBNP, rule out acute on chronic diastolic CHF proBNP 3,885 Continue total input/output, daily weights and fluid restriction Lasix  held Continue heart healthy diet      Echocardiogram done  on 08/30/2023 showed LVEF of 60 to 65%.  No RWMA.  G1 DD.   Repeat Echocardiogram showed EF of 60 to 65% with grade 1 diastolic dysfunction.   Acute kidney injury, resolved BUN/creatinine 47/1.53 (baseline creatinine at 0.7-1.0). Cr today is within normal limits. Renally adjust medications, avoid nephrotoxic agents/dehydration/hypotension  Abdominal pain with tenderness: Ordered STAT CT abdomen/pelvis to rule out bowel obstruction/perforation.  It showed evidence of mild to moderate uncomplicated pancreatitis. Discontinued as needed oral hydrocodone  and started on IV 1 mg morphine  every 3 hours for severe pain on 04/01/2024. It showed mild to moderate uncomplicated pancreatitis. Diet advanced to mechanical soft diet on 03/31/2024 but changed back to full liquid diet on 04/01/2024 because of worsening abdominal pain while on the mechanical soft diet. S/P soapsuds enema on 03/28/2024 which resulted in a bowel movement.    Presumed CAP, bacterial, POA: Patient was empirically started on IV ceftriaxone  and azithromycin  Chest x-ray was more revealing for pulmonary vascular congestion and interstitial edema Patient was known to be positive for COVID infection   Chronic atrial flutter/fibrillation Continue Eliquis . Temporarily hold Lopressor  due to soft BP   Bipolar disorder Anxiety with depression Continue home Zoloft , Depakote  and BuSpar .   Nicotine  use Continue nicotine  patch She vapes daily   Essential hypertension Hold BP meds at this time due to soft BP   Hyperlipidemia Continue Lipitor  Disposition: Lives at home by herself and uses 2 L nasal oxygen  at all times.  She denied any needs  DVT prophylaxis: SCDs Start: 03/27/24 0053 apixaban  (ELIQUIS ) tablet 5 mg     Code Status: Full Code Family Communication: None at the bedside Status is: Inpatient Remains inpatient appropriate because: ARF, COPD exacerbation    Subjective:  She ate some  potatoes as well as chicken  yesterday but her abdominal pain got worse so she could not tolerate that.  We spoke about reverting back to full liquid diet today and she is agreeable to that.  Her breathing feels fine and she is currently on 2 L/min nasal cannula.  She told me that she does have oxygen  cylinder and concentrator at home and she is dependent on 2 L nasal cannula at baseline at all times.  We also discussed about changing her oral opioids as needed to intravenous 1 mg morphine  every 3 hours prn for severe pain.  Examination:  General exam: Appears alert and awake, 3 L nasal oxygen  in place Respiratory system: diminished breath sounds bilaterally Cardiovascular system: S1 & S2 heard, RRR. No JVD, murmurs, rubs, gallops or clicks. No pedal edema. Gastrointestinal system: Abdomen is slightly distended, tender to palpation in lower quadrants, improved as compared to prior Central nervous system: Alert and oriented. No focal neurological deficits. Extremities: Symmetric 5 x 5 power. Skin: No rashes, lesions or ulcers Psychiatry: Judgement and insight appear normal. Mood & affect appropriate.       Diet Orders (From admission, onward)     Start     Ordered   04/01/24 1009  Diet full liquid Room service appropriate? Yes; Fluid consistency: Thin  Diet effective now       Question Answer Comment  Room service appropriate? Yes   Fluid consistency: Thin      04/01/24 1008            Objective: Vitals:   03/31/24 0524 03/31/24 2144 04/01/24 0449 04/01/24 0614  BP:  (!) 150/73 (!) 182/87 135/71  Pulse:  66 86 72  Resp:  18 20   Temp:  98.5 F (36.9 C) 97.9 F (36.6 C)   TempSrc:  Oral    SpO2:  97% 97%   Weight: 82.6 kg   82.1 kg  Height:        Intake/Output Summary (Last 24 hours) at 04/01/2024 1008 Last data filed at 04/01/2024 0830 Gross per 24 hour  Intake 1240 ml  Output --  Net 1240 ml   Filed Weights   03/30/24 0315 03/31/24 0524 04/01/24 0614  Weight: 83.1 kg 82.6 kg 82.1 kg     Scheduled Meds:  apixaban   5 mg Oral BID   ascorbic acid  500 mg Oral BID   atorvastatin   20 mg Oral QHS   busPIRone   10 mg Oral TID   cholecalciferol  400 Units Oral Daily   dextromethorphan -guaiFENesin   1 tablet Oral BID   divalproex   500 mg Oral BID   feeding supplement  237 mL Oral BID BM   multivitamin with minerals  1 tablet Oral Daily   nicotine   21 mg Transdermal Daily   pantoprazole   40 mg Oral Daily   sertraline   200 mg Oral Daily   Continuous Infusions:  Nutritional status Signs/Symptoms: estimated needs Interventions: Ensure Enlive (each supplement provides 350kcal and 20 grams of protein), MVI, Liberalize Diet Body mass index is 30.1 kg/m.  Data Reviewed:   CBC: Recent Labs  Lab 03/26/24 1728 03/27/24 0419 03/29/24 0441 04/01/24 0443  WBC 4.5 3.4* 5.4 6.6  NEUTROABS 2.8  --  4.1 4.0  HGB 9.8* 9.5* 9.7* 11.6*  HCT 31.9* 30.8* 31.2* 36.7  MCV 93.0 91.7 93.4 90.0  PLT 159 143* 145* 146*   Basic Metabolic Panel: Recent Labs  Lab 03/26/24 1728 03/27/24 0419 03/29/24 0441 04/01/24 0443  NA 143  142 142 143  K 4.4 4.6 4.3 4.0  CL 101 101 102 103  CO2 32 31 34* 33*  GLUCOSE 101* 152* 80 91  BUN 47* 50* 53* 30*  CREATININE 1.53* 1.20* 0.85 0.66  CALCIUM  8.9 8.9 8.9 9.1  MG 2.2 2.3  --   --   PHOS  --  4.3  --   --    GFR: Estimated Creatinine Clearance: 72.2 mL/min (by C-G formula based on SCr of 0.66 mg/dL). Liver Function Tests: Recent Labs  Lab 03/26/24 1728 03/27/24 0419  AST <10* <10*  ALT <5 5  ALKPHOS 62 62  BILITOT 0.3 <0.2  PROT 7.2 7.2  ALBUMIN 3.5 3.4*   Recent Labs  Lab 03/28/24 1639  LIPASE 86*   No results for input(s): AMMONIA in the last 168 hours. Coagulation Profile: No results for input(s): INR, PROTIME in the last 168 hours. Cardiac Enzymes: No results for input(s): CKTOTAL, CKMB, CKMBINDEX, TROPONINI in the last 168 hours. BNP (last 3 results) Recent Labs    03/26/24 1728  PROBNP 3,885.0*    HbA1C: No results for input(s): HGBA1C in the last 72 hours. CBG: No results for input(s): GLUCAP in the last 168 hours. Lipid Profile: No results for input(s): CHOL, HDL, LDLCALC, TRIG, CHOLHDL, LDLDIRECT in the last 72 hours. Thyroid Function Tests: No results for input(s): TSH, T4TOTAL, FREET4, T3FREE, THYROIDAB in the last 72 hours. Anemia Panel: No results for input(s): VITAMINB12, FOLATE, FERRITIN, TIBC, IRON, RETICCTPCT in the last 72 hours. Sepsis Labs: Recent Labs  Lab 03/27/24 0419  PROCALCITON <0.10    Recent Results (from the past 240 hours)  Resp panel by RT-PCR (RSV, Flu A&B, Covid) Anterior Nasal Swab     Status: Abnormal   Collection Time: 03/26/24  5:06 PM   Specimen: Anterior Nasal Swab  Result Value Ref Range Status   SARS Coronavirus 2 by RT PCR POSITIVE (A) NEGATIVE Final    Comment: (NOTE) SARS-CoV-2 target nucleic acids are DETECTED.  The SARS-CoV-2 RNA is generally detectable in upper respiratory specimens during the acute phase of infection. Positive results are indicative of the presence of the identified virus, but do not rule out bacterial infection or co-infection with other pathogens not detected by the test. Clinical correlation with patient history and other diagnostic information is necessary to determine patient infection status. The expected result is Negative.  Fact Sheet for Patients: bloggercourse.com  Fact Sheet for Healthcare Providers: seriousbroker.it  This test is not yet approved or cleared by the [place]  FDA and  has been authorized for detection and/or diagnosis of SARS-CoV-2 by FDA under an Emergency Use Authorization (EUA).  This EUA will remain in effect (meaning this test can be used) for the duration of  the COVID-19 declaration under Section 564(b)(1) of the A ct, 21 U.S.C. section 360bbb-3(b)(1), unless the authorization  is terminated or revoked sooner.     Influenza A by PCR NEGATIVE NEGATIVE Final   Influenza B by PCR NEGATIVE NEGATIVE Final    Comment: (NOTE) The Xpert Xpress SARS-CoV-2/FLU/RSV plus assay is intended as an aid in the diagnosis of influenza from Nasopharyngeal swab specimens and should not be used as a sole basis for treatment. Nasal washings and aspirates are unacceptable for Xpert Xpress SARS-CoV-2/FLU/RSV testing.  Fact Sheet for Patients: bloggercourse.com  Fact Sheet for Healthcare Providers: seriousbroker.it  This test is not yet approved or cleared by the [place]  FDA and has been authorized for detection and/or diagnosis of SARS-CoV-2 by  FDA under an Emergency Use Authorization (EUA). This EUA will remain in effect (meaning this test can be used) for the duration of the COVID-19 declaration under Section 564(b)(1) of the Act, 21 U.S.C. section 360bbb-3(b)(1), unless the authorization is terminated or revoked.     Resp Syncytial Virus by PCR NEGATIVE NEGATIVE Final    Comment: (NOTE) Fact Sheet for Patients: bloggercourse.com  Fact Sheet for Healthcare Providers: seriousbroker.it  This test is not yet approved or cleared by the [place]  FDA and has been authorized for detection and/or diagnosis of SARS-CoV-2 by FDA under an Emergency Use Authorization (EUA). This EUA will remain in effect (meaning this test can be used) for the duration of the COVID-19 declaration under Section 564(b)(1) of the Act, 21 U.S.C. section 360bbb-3(b)(1), unless the authorization is terminated or revoked.  Performed at [facility], [address], [place], [place] [address]          Radiology Studies: No results found.         LOS: 6 days   Time spent= 39 mins    Deliliah Room, MD [facility]  If 7PM-7AM, please contact  night-coverage  04/01/2024, 10:08 AM

## 2024-04-02 ENCOUNTER — Inpatient Hospital Stay (HOSPITAL_COMMUNITY)

## 2024-04-02 DIAGNOSIS — J9621 Acute and chronic respiratory failure with hypoxia: Secondary | ICD-10-CM | POA: Diagnosis not present

## 2024-04-02 LAB — LIPASE, BLOOD: Lipase: 44 U/L (ref 11–51)

## 2024-04-02 MED ORDER — MELATONIN 3 MG PO TABS
6.0000 mg | ORAL_TABLET | Freq: Every evening | ORAL | Status: DC | PRN
  Administered 2024-04-02: 6 mg via ORAL
  Filled 2024-04-02: qty 2

## 2024-04-02 MED ORDER — HYDROMORPHONE HCL 1 MG/ML IJ SOLN
0.5000 mg | INTRAMUSCULAR | Status: DC | PRN
  Administered 2024-04-02 – 2024-04-03 (×7): 0.5 mg via INTRAVENOUS
  Filled 2024-04-02 (×7): qty 0.5

## 2024-04-02 NOTE — Progress Notes (Addendum)
 PROGRESS NOTE    [name] [name]  FMW:[id number] DOB: [birth year] DOA: 03/26/2024 PCP: Bevely Doffing, FNP   Brief Narrative:  68 y.o. female with medical history significant of hypertension, hyperlipidemia, hypothyroidism, chronic respiratory failure on 2 LPM of oxygen , COPD, atrial flutter/fibrillation, bipolar disorder presents to the emergency department due to 1 week onset of generalized weakness, she complained of productive cough of thick yellow phlegm, chest congestion, leg buckling, poor oral intake and fever.   Chest x-ray showed  Low lung volumes with mild bibasilar atelectasis. Mild cardiomegaly with mild pulmonary vascular congestion. A mild component of associated interstitial edema cannot be excluded. Being treated for Acute on chronic respiratory failure with hypoxia possibly secondary to COVID-19 virus infection and COPD exacerbation. CT of the abdomen was done because of the abdominal pain and it was for mild to moderate uncomplicated pancreatitis.   Assessment & Plan:  Principal Problem:   Acute on chronic respiratory failure with hypoxia (HCC)    Acute on chronic respiratory failure with hypoxia possibly secondary to COVID-19 virus infection Acute exacerbation of COPD Continue duo nebs, Mucinex  Azithromycin  discontinued on 11/8 after 5 day course. Continue Protonix  to prevent steroid-induced ulcer Continue incentive spirometry and flutter valve Changed IV solumedrol to po prednisone  40 mg daily on 11/5. Dced steroids on 11/10. Continue supplemental oxygen  to maintain O2 sat between 88-92% 6 minutes walk test ordered She does have oxygen  cylinder and concentrator at home along with nebulization machine.  She is dependent on 2 L/min nasal cannula at home at all times.   Elevated proBNP, rule out acute on chronic diastolic CHF proBNP 3,885 Continue total input/output, daily weights and fluid restriction Lasix  held Continue heart healthy diet      Echocardiogram done  on 08/30/2023 showed LVEF of 60 to 65%.  No RWMA.  G1 DD.   Repeat Echocardiogram showed EF of 60 to 65% with grade 1 diastolic dysfunction.   Acute kidney injury, resolved BUN/creatinine 47/1.53 (baseline creatinine at 0.7-1.0). Cr today is within normal limits. Renally adjust medications, avoid nephrotoxic agents/dehydration/hypotension  Abdominal pain with tenderness: Improving symptoms.   Ordered STAT CT abdomen/pelvis to rule out bowel obstruction/perforation.  It showed evidence of mild to moderate uncomplicated pancreatitis. Discontinued as needed oral hydrocodone  and started on IV 1 mg morphine  every 3 hours for severe pain on 04/01/2024.  Since patient was having headache with IV morphine , this was switched to IV Dilaudid 0.5 mg as needed on 04/02/2024. Diet advanced to mechanical soft diet on 03/31/2024 but changed back to full liquid diet on 04/01/2024 because of worsening abdominal pain while on the mechanical soft diet. S/P soapsuds enema on 03/28/2024 which resulted in a bowel movement. Lipase today is 44 down from initial value of 86. I reviewed the x-ray of the abdomen which was done today and it did not show any acute abnormalities.  There is no evidence of bowel obstruction or pneumoperitoneum.    Presumed CAP, bacterial, POA: Patient was empirically started on IV ceftriaxone  and azithromycin  Chest x-ray was more revealing for pulmonary vascular congestion and interstitial edema Patient was known to be positive for COVID infection   Chronic atrial flutter/fibrillation Continue Eliquis . Temporarily hold Lopressor  due to soft BP   Bipolar disorder Anxiety with depression Continue home Zoloft , Depakote  and BuSpar .   Nicotine  use Continue nicotine  patch She vapes daily   Essential hypertension Hold BP meds at this time due to soft BP   Hyperlipidemia Continue Lipitor  Insomnia: Ordered as needed melatonin 5 mg  nightly  Disposition: Lives at home by herself and uses  2 L nasal oxygen  at all times.  She denied any needs  DVT prophylaxis: SCDs Start: 03/27/24 0053 apixaban  (ELIQUIS ) tablet 5 mg     Code Status: Full Code Family Communication: None at the bedside Status is: Inpatient Remains inpatient appropriate because: ARF, COPD exacerbation    Subjective:  She says that intravenous morphine  has been giving her headaches but is effective at relieving the abdominal pain.  We spoke about changing intravenous morphine  to as needed Dilaudid.  I also spoke to her about getting x-ray of her abdomen because of persistent abdominal pain and checking a repeat lipase level.  Her appetite is not good.  Also spoke to her about getting dietitian on board.  Headache, I spoke to her about altering intravenous Toradol  and we will see if that helps  Examination:  General exam: Appears alert and awake, 3 L nasal oxygen  in place Respiratory system: diminished breath sounds bilaterally Cardiovascular system: S1 & S2 heard, RRR. No JVD, murmurs, rubs, gallops or clicks. No pedal edema. Gastrointestinal system: Abdomen is slightly distended, tender to palpation in lower quadrants, improved as compared to prior Central nervous system: Alert and oriented. No focal neurological deficits. Extremities: Symmetric 5 x 5 power. Skin: No rashes, lesions or ulcers Psychiatry: Judgement and insight appear normal. Mood & affect appropriate.       Diet Orders (From admission, onward)     Start     Ordered   04/01/24 1009  Diet full liquid Room service appropriate? Yes; Fluid consistency: Thin  Diet effective now       Question Answer Comment  Room service appropriate? Yes   Fluid consistency: Thin      04/01/24 1008            Objective: Vitals:   04/01/24 1304 04/01/24 1957 04/02/24 0500 04/02/24 0642  BP: (!) 166/79 (!) 147/88  (!) 155/80  Pulse: 76 89  85  Resp: 20 18  18   Temp: 98.1 F (36.7 C) 98.5 F (36.9 C)  98.7 F (37.1 C)  TempSrc: Oral Oral   Oral  SpO2: 97% 96%  97%  Weight:   81.6 kg   Height:       No intake or output data in the 24 hours ending 04/02/24 0939  Filed Weights   03/31/24 0524 04/01/24 0614 04/02/24 0500  Weight: 82.6 kg 82.1 kg 81.6 kg    Scheduled Meds:  apixaban   5 mg Oral BID   ascorbic acid  500 mg Oral BID   atorvastatin   20 mg Oral QHS   busPIRone   10 mg Oral TID   cholecalciferol  400 Units Oral Daily   dextromethorphan -guaiFENesin   1 tablet Oral BID   divalproex   500 mg Oral BID   feeding supplement  237 mL Oral BID BM   multivitamin with minerals  1 tablet Oral Daily   nicotine   21 mg Transdermal Daily   pantoprazole   40 mg Oral Daily   sertraline   200 mg Oral Daily   Continuous Infusions:  Nutritional status Signs/Symptoms: estimated needs Interventions: Ensure Enlive (each supplement provides 350kcal and 20 grams of protein), MVI, Liberalize Diet Body mass index is 29.95 kg/m.  Data Reviewed:   CBC: Recent Labs  Lab 03/26/24 1728 03/27/24 0419 03/29/24 0441 04/01/24 0443  WBC 4.5 3.4* 5.4 6.6  NEUTROABS 2.8  --  4.1 4.0  HGB 9.8* 9.5* 9.7* 11.6*  HCT 31.9* 30.8* 31.2* 36.7  MCV 93.0 91.7 93.4 90.0  PLT 159 143* 145* 146*   Basic Metabolic Panel: Recent Labs  Lab 03/26/24 1728 03/27/24 0419 03/29/24 0441 04/01/24 0443  NA 143 142 142 143  K 4.4 4.6 4.3 4.0  CL 101 101 102 103  CO2 32 31 34* 33*  GLUCOSE 101* 152* 80 91  BUN 47* 50* 53* 30*  CREATININE 1.53* 1.20* 0.85 0.66  CALCIUM  8.9 8.9 8.9 9.1  MG 2.2 2.3  --   --   PHOS  --  4.3  --   --    GFR: Estimated Creatinine Clearance: 72 mL/min (by C-G formula based on SCr of 0.66 mg/dL). Liver Function Tests: Recent Labs  Lab 03/26/24 1728 03/27/24 0419  AST <10* <10*  ALT <5 5  ALKPHOS 62 62  BILITOT 0.3 <0.2  PROT 7.2 7.2  ALBUMIN 3.5 3.4*   Recent Labs  Lab 03/28/24 1639  LIPASE 86*   No results for input(s): AMMONIA in the last 168 hours. Coagulation Profile: No results for input(s):  INR, PROTIME in the last 168 hours. Cardiac Enzymes: No results for input(s): CKTOTAL, CKMB, CKMBINDEX, TROPONINI in the last 168 hours. BNP (last 3 results) Recent Labs    03/26/24 1728  PROBNP 3,885.0*   HbA1C: No results for input(s): HGBA1C in the last 72 hours. CBG: No results for input(s): GLUCAP in the last 168 hours. Lipid Profile: No results for input(s): CHOL, HDL, LDLCALC, TRIG, CHOLHDL, LDLDIRECT in the last 72 hours. Thyroid Function Tests: No results for input(s): TSH, T4TOTAL, FREET4, T3FREE, THYROIDAB in the last 72 hours. Anemia Panel: No results for input(s): VITAMINB12, FOLATE, FERRITIN, TIBC, IRON, RETICCTPCT in the last 72 hours. Sepsis Labs: Recent Labs  Lab 03/27/24 0419  PROCALCITON <0.10    Recent Results (from the past 240 hours)  Resp panel by RT-PCR (RSV, Flu A&B, Covid) Anterior Nasal Swab     Status: Abnormal   Collection Time: 03/26/24  5:06 PM   Specimen: Anterior Nasal Swab  Result Value Ref Range Status   SARS Coronavirus 2 by RT PCR POSITIVE (A) NEGATIVE Final    Comment: (NOTE) SARS-CoV-2 target nucleic acids are DETECTED.  The SARS-CoV-2 RNA is generally detectable in upper respiratory specimens during the acute phase of infection. Positive results are indicative of the presence of the identified virus, but do not rule out bacterial infection or co-infection with other pathogens not detected by the test. Clinical correlation with patient history and other diagnostic information is necessary to determine patient infection status. The expected result is Negative.  Fact Sheet for Patients: bloggercourse.com  Fact Sheet for Healthcare Providers: seriousbroker.it  This test is not yet approved or cleared by the [place]  FDA and  has been authorized for detection and/or diagnosis of SARS-CoV-2 by FDA under an Emergency Use  Authorization (EUA).  This EUA will remain in effect (meaning this test can be used) for the duration of  the COVID-19 declaration under Section 564(b)(1) of the A ct, 21 U.S.C. section 360bbb-3(b)(1), unless the authorization is terminated or revoked sooner.     Influenza A by PCR NEGATIVE NEGATIVE Final   Influenza B by PCR NEGATIVE NEGATIVE Final    Comment: (NOTE) The Xpert Xpress SARS-CoV-2/FLU/RSV plus assay is intended as an aid in the diagnosis of influenza from Nasopharyngeal swab specimens and should not be used as a sole basis for treatment. Nasal washings and aspirates are unacceptable for Xpert Xpress SARS-CoV-2/FLU/RSV testing.  Fact Sheet for Patients: bloggercourse.com  Fact Sheet for Healthcare Providers: seriousbroker.it  This test is not yet approved or cleared by the [place]  FDA and has been authorized for detection and/or diagnosis of SARS-CoV-2 by FDA under an Emergency Use Authorization (EUA). This EUA will remain in effect (meaning this test can be used) for the duration of the COVID-19 declaration under Section 564(b)(1) of the Act, 21 U.S.C. section 360bbb-3(b)(1), unless the authorization is terminated or revoked.     Resp Syncytial Virus by PCR NEGATIVE NEGATIVE Final    Comment: (NOTE) Fact Sheet for Patients: bloggercourse.com  Fact Sheet for Healthcare Providers: seriousbroker.it  This test is not yet approved or cleared by the [place]  FDA and has been authorized for detection and/or diagnosis of SARS-CoV-2 by FDA under an Emergency Use Authorization (EUA). This EUA will remain in effect (meaning this test can be used) for the duration of the COVID-19 declaration under Section 564(b)(1) of the Act, 21 U.S.C. section 360bbb-3(b)(1), unless the authorization is terminated or revoked.  Performed at [facility], [address],  [place], [place] [address]        Radiology Studies: No results found.      LOS: 7 days   Time spent= 39 mins    Deliliah Room, MD [facility]  If 7PM-7AM, please contact night-coverage  04/02/2024, 9:39 AM

## 2024-04-02 NOTE — Progress Notes (Addendum)
 Nutrition Follow-up  DOCUMENTATION CODES:   Obesity unspecified  INTERVENTION:   Continue Ensure Plus High Protein po BID, each supplement provides 350 kcal and 20 grams of protein Continue MVI with minerals, vitamin C, and vitamin D Magic cup TID with meals, each supplement provides 290 kcal and 9 grams of protein [employer] Essentials with breakfast, each packet mixed with 8 ounces of whole milk provides 13 grams of protein and 290 calories Food preferences added to Health Touch  NUTRITION DIAGNOSIS:   Increased nutrient needs related to chronic illness (COPD) as evidenced by estimated needs; ongoing   GOAL:   Patient will meet greater than or equal to 90% of their needs; progressing  MONITOR:   PO intake, Supplement acceptance  REASON FOR ASSESSMENT:   Malnutrition Screening Tool    ASSESSMENT:   68 y.o. female with medical history significant of hypertension, hyperlipidemia, hypothyroidism, chronic respiratory failure on 2 LPM of oxygen , COPD, atrial flutter/fibrillation, bipolar disorder presents due to 1 week PTA onset of generalized weakness, she complained of productive cough of thick yellow phlegm, chest congestion, leg buckling, poor oral intake, fever and that she has been staying in bed most of the time.  She states that she ran out of oxygen  and was without her oxygen  for about 2 days PTA during which she developed  worsening shortness of breath  Patient is currently on a full liquid diet.  Previously on a dysphagia 3 diet with thin liquids 11/9-11/10 AM. Meal completions documented at 100% for lunch and dinner 11/9 and 100% for breakfast 11/10.  Spoke with patient over the phone. She states she did not tolerate the solid foods, they caused her abdominal pain. She has been doing a little better with the liquids. She likes potato soup, vanilla pudding, milk, Ensure supplements. She agreed to try magic cups with meals.   Labs reviewed.  Hgb  11.6  Medications reviewed and include vitamin C, vitamin D, MVI with minerals, protonix .  Admit weight 90.9 kg Current weight 81.6 kg Decrease in weight likely d/t decreasing edema.   Diet Order:   Diet Order             Diet full liquid Room service appropriate? Yes; Fluid consistency: Thin  Diet effective now                   EDUCATION NEEDS:   No education needs have been identified at this time  Skin:  Skin Assessment: Reviewed RN Assessment  Last BM:  11/11  Height:   Ht Readings from Last 1 Encounters:  03/30/24 5' 5 (1.651 m)    Weight:   Wt Readings from Last 1 Encounters:  04/02/24 81.6 kg    Ideal Body Weight:  56.8 kg  BMI:  Body mass index is 29.95 kg/m.  Estimated Nutritional Needs:   Kcal:  1700-1900  Protein:  90-105 grams  Fluid:  1.7-1.9 L   Suzen HUNT RD, LDN, CNSC Contact via secure chat. If unavailable, use group chat RD Inpatient.

## 2024-04-02 NOTE — Plan of Care (Signed)
  Problem: Education: Goal: Knowledge of risk factors and measures for prevention of condition will improve Outcome: Progressing   Problem: Respiratory: Goal: Complications related to the disease process, condition or treatment will be avoided or minimized Outcome: Progressing   Problem: Education: Goal: Knowledge of General Education information will improve Description: Including pain rating scale, medication(s)/side effects and non-pharmacologic comfort measures Outcome: Progressing   Problem: Clinical Measurements: Goal: Ability to maintain clinical measurements within normal limits will improve Outcome: Progressing Goal: Will remain free from infection Outcome: Progressing Goal: Diagnostic test results will improve Outcome: Progressing Goal: Respiratory complications will improve Outcome: Progressing   Problem: Activity: Goal: Risk for activity intolerance will decrease Outcome: Progressing   Problem: Nutrition: Goal: Adequate nutrition will be maintained Outcome: Progressing   Problem: Coping: Goal: Level of anxiety will decrease Outcome: Progressing   Problem: Elimination: Goal: Will not experience complications related to bowel motility Outcome: Progressing   Problem: Pain Managment: Goal: General experience of comfort will improve and/or be controlled Outcome: Progressing

## 2024-04-02 NOTE — Evaluation (Signed)
 Physical Therapy Evaluation Patient Details Name: [name] [name] MRN: [id number] DOB: [birth year] Today's Date: 04/02/2024  History of Present Illness  [name] [name] is a 68 y.o. female with medical history significant of hypertension, hyperlipidemia, hypothyroidism, chronic respiratory failure on 2 LPM of oxygen , COPD, atrial flutter/fibrillation, bipolar disorder presents to the emergency department due to 1 week onset of generalized weakness, she complained of productive cough of thick yellow phlegm, chest congestion, leg buckling, poor oral intake, fever and that she has been staying in bed most of the time.  She states that she ran out of oxygen  and was without her oxygen  for about 2 days during which she developed  worsening shortness of breath and she activated EMS today, on arrival of EMS team, she was noted to  Have an O2 sat of 87%, Solu-Medrol  was given, breathing treatment was provided.  She denies chest pain, nausea, vomiting.  She obtain flu vaccine after she already started to have flulike symptoms.   Clinical Impression  Patient agreeable to PT evaluation. Patient reports at baseline, she is an independent household and limited community ambulator without AD, independent with ADL, and assisted with iADLs. On this date, patient remains independent/modified independent with all mobility including ambulation in room without AD. Pt on baseline of 2 Lpm via [place]. Reports she had completed a with the mobility tech prior to our session. Reports feeling near/at her baseline. Patient does not present with urgent need for skilled physical therapy at this time. Patient discharged to care of nursing for ambulation daily as tolerated for length of stay.       If plan is discharge home, recommend the following:     Can travel by private vehicle        Equipment Recommendations None recommended by PT  Recommendations for Other Services       Functional Status Assessment Patient has  had a recent decline in their functional status and demonstrates the ability to make significant improvements in function in a reasonable and predictable amount of time.     Precautions / Restrictions Precautions Precautions: None Recall of Precautions/Restrictions: Intact Restrictions Weight Bearing Restrictions Per Provider Order: No      Mobility  Bed Mobility Overal bed mobility: Independent   General bed mobility comments: HOB slightly elevated, no physical assist    Transfers Overall transfer level: Independent Equipment used: None   General transfer comment: Including STS from bed, and bed>chair transfer, no AD used, mild unsteadiness    Ambulation/Gait Ambulation/Gait assistance: Supervision Gait Distance (Feet): 40 Feet Assistive device: None Gait Pattern/deviations: WFL(Within Functional Limits) Gait velocity: slightly dec     General Gait Details: Pt ambulates in room, limited due to airborne precautions, O2 on 2 Lpm, very mild unsteadiness initially requiring stability from wall but none thereafter, reports no SOB or dyspnea.  Stairs            Wheelchair Mobility     Tilt Bed    Modified Rankin (Stroke Patients Only)       Balance Overall balance assessment: Mild deficits observed, not formally tested             Pertinent Vitals/Pain Pain Assessment Pain Assessment: 0-10 Pain Score: 8  Pain Location: Abdomen Pain Descriptors / Indicators: Sharp, Constant Pain Intervention(s): Limited activity within patient's tolerance, Monitored during session, Repositioned    Home Living Family/patient expects to be discharged to:: Private residence Living Arrangements: Alone Available Help at Discharge: Family;Available PRN/intermittently   Home Access: Level entry  Home Layout: One level Home Equipment: Agricultural Consultant (2 wheels);BSC/3in1;Cane - single point;Shower seat;Grab bars - tub/shower Additional Comments: Pt confirms  previously entered information in chart.    Prior Function Prior Level of Function : Independent/Modified Independent       Mobility Comments: Reports as household ambulator and short distance community, to/from technical brewer and to/from car. Drives. Reports no falls ADLs Comments: Reoprts independent with ADLs, assisted with iADLs from daughter     Extremity/Trunk Assessment   Upper Extremity Assessment Upper Extremity Assessment: Overall WFL for tasks assessed    Lower Extremity Assessment Lower Extremity Assessment: Overall WFL for tasks assessed    Cervical / Trunk Assessment Cervical / Trunk Assessment: Normal  Communication   Communication Communication: No apparent difficulties    Cognition Arousal: Alert Behavior During Therapy: WFL for tasks assessed/performed   PT - Cognitive impairments: No apparent impairments         Following commands: Intact       Cueing Cueing Techniques: Verbal cues, Visual cues     General Comments      Exercises     Assessment/Plan    PT Assessment Patient does not need any further PT services  PT Problem List         PT Treatment Interventions      PT Goals (Current goals can be found in the Care Plan section)  Acute Rehab PT Goals Patient Stated Goal: return home PT Goal Formulation: With patient Time For Goal Achievement: 04/05/24 Potential to Achieve Goals: Good    Frequency       Co-evaluation               AM-PAC PT 6 Clicks Mobility  Outcome Measure Help needed turning from your back to your side while in a flat bed without using bedrails?: None Help needed moving from lying on your back to sitting on the side of a flat bed without using bedrails?: None Help needed moving to and from a bed to a chair (including a wheelchair)?: None Help needed standing up from a chair using your arms (e.g., wheelchair or bedside chair)?: None Help needed to walk in hospital room?: None Help needed climbing 3-5  steps with a railing? : None 6 Click Score: 24    End of Session   Activity Tolerance: Patient tolerated treatment well Patient left: in chair;with call bell/phone within reach Nurse Communication: Patient requests pain meds PT Visit Diagnosis: Other abnormalities of gait and mobility (R26.89);Pain Pain - part of body:  (abdomen)    Time: 8970-8957 PT Time Calculation (min) (ACUTE ONLY): 13 min   Charges:   PT Evaluation $PT Eval Low Complexity: 1 Low   PT General Charges $$ ACUTE PT VISIT: 1 Visit       2:19 PM, 04/02/24 Nic Lampe Powell-Butler, PT, DPT [place] with [facility]

## 2024-04-02 NOTE — Progress Notes (Signed)
 Mobility Specialist Progress Note:    04/02/24 0910  Mobility  Activity Ambulated with assistance  Level of Assistance Modified independent, requires aide device or extra time  Assistive Device None  Distance Ambulated (ft) 350 ft  Range of Motion/Exercises Active;All extremities  Activity Response Tolerated well  Mobility Referral Yes  Mobility visit 1 Mobility  Mobility Specialist Start Time (ACUTE ONLY) 0910  Mobility Specialist Stop Time (ACUTE ONLY) 0930  Mobility Specialist Time Calculation (min) (ACUTE ONLY) 20 min   Pt received in bed, RN requesting O2 test. ModI to stand and ambulate with no AD. Tolerated well, SpO2 97% on 2L at rest. SpO2 93-96% on 2L during ambulation, ambulated for a total of 6 minutes. Left sitting EOB, all needs met.  Roda Lauture Mobility Specialist Please contact via Special Educational Needs Teacher or  Rehab office at [phone]

## 2024-04-03 ENCOUNTER — Telehealth: Payer: Self-pay

## 2024-04-03 DIAGNOSIS — J9621 Acute and chronic respiratory failure with hypoxia: Secondary | ICD-10-CM | POA: Diagnosis not present

## 2024-04-03 MED ORDER — DOCUSATE SODIUM 100 MG PO CAPS: 100.0000 mg | ORAL_CAPSULE | Freq: Two times a day (BID) | ORAL | 0 refills | Status: DC | PRN

## 2024-04-03 MED ORDER — PANTOPRAZOLE SODIUM 40 MG PO TBEC
40.0000 mg | DELAYED_RELEASE_TABLET | Freq: Every day | ORAL | 0 refills | Status: AC
Start: 2024-04-04 — End: ?

## 2024-04-03 MED ORDER — HYDROMORPHONE HCL 2 MG PO TABS: 1.0000 mg | ORAL_TABLET | Freq: Four times a day (QID) | ORAL | 0 refills | Status: AC | PRN

## 2024-04-03 MED ORDER — ASCORBIC ACID 500 MG PO TABS: 500.0000 mg | ORAL_TABLET | Freq: Two times a day (BID) | ORAL | 0 refills | Status: DC

## 2024-04-03 MED ORDER — CHOLECALCIFEROL 10 MCG (400 UNIT) PO TABS: 400.0000 [IU] | ORAL_TABLET | Freq: Every day | ORAL | 0 refills | Status: DC

## 2024-04-03 NOTE — Plan of Care (Signed)
  Problem: Education: Goal: Knowledge of risk factors and measures for prevention of condition will improve Outcome: Progressing   Problem: Respiratory: Goal: Complications related to the disease process, condition or treatment will be avoided or minimized Outcome: Progressing   

## 2024-04-03 NOTE — Plan of Care (Signed)
  Problem: Education: Goal: Knowledge of risk factors and measures for prevention of condition will improve 04/03/2024 0947 by Dionisio Tinnie LABOR, RN Outcome: Progressing 04/03/2024 0947 by Dionisio Tinnie LABOR, RN Outcome: Progressing   Problem: Respiratory: Goal: Complications related to the disease process, condition or treatment will be avoided or minimized 04/03/2024 0947 by Dionisio Tinnie LABOR, RN Outcome: Progressing 04/03/2024 0947 by Dionisio Tinnie LABOR, RN Outcome: Progressing

## 2024-04-03 NOTE — Discharge Summary (Signed)
 Physician Discharge Summary   Patient: [name] [name] MRN: [id number] DOB: [birth year]  Admit date:     03/26/2024  Discharge date: 04/03/24  Discharge Physician: Deliliah Room   PCP: Bevely Doffing, FNP   Recommendations at discharge:   Follow-up with your PCP in 1 week.  Follow-up outpatient with pulmonology in a month.  Continue taking medications as prescribed.  Emergency room if you develop worsening shortness of breath, chest pain, abdominal pain or fever.  Discharge Diagnoses: Principal Problem:   Acute on chronic respiratory failure with hypoxia [facility])   Hospital Course:  68 y.o. female with medical history significant of hypertension, hyperlipidemia, hypothyroidism, chronic respiratory failure on 2 LPM of oxygen , COPD, atrial flutter/fibrillation, bipolar disorder presents to the emergency department due to 1 week onset of generalized weakness, she complained of productive cough of thick yellow phlegm, chest congestion, leg buckling, poor oral intake and fever.   Chest x-ray showed  Low lung volumes with mild bibasilar atelectasis. Mild cardiomegaly with mild pulmonary vascular congestion. A mild component of associated interstitial edema cannot be excluded.  She was treated for Acute on chronic respiratory failure with hypoxia possibly secondary to COVID-19 virus infection and COPD exacerbation with inhalation of bronchodilator therapy, antibiotics as well as corticosteroids. CT of the abdomen was done because of the abdominal pain and it was for mild to moderate uncomplicated pancreatitis.  His level was elevated at 86.  Repeat lipase level is 44.  Patient is able to tolerate soft diet without any nausea, vomiting or worsening abdominal pain.  She was advised to adhere with the soft diet for a few days and then she can advance to a fully solid diet. She was treated for acute kidney injury which was resolved.  Patient's diuretics and antihypertensives were resumed at the time of the  discharge and she will need a repeat BMP in 1 week. Walk test was done and patient was requiring 2 L oxygen  both at rest and exertion, which is her baseline oxygen  requirement. She was given prescription for oral Dilaudid She was started to follow-up outpatient with PCP in 1 week as well as pulmonology in 1 month.      Consultants: None Procedures performed: None  Disposition: Home Diet recommendation:  Soft diet DISCHARGE MEDICATION: Allergies as of 04/03/2024       Reactions   Bactrim [sulfamethoxazole -trimethoprim] Anaphylaxis   Penicillins Shortness Of Breath   Amoxicillin  Hives   Diclofenac Hives, Swelling, Tinitus   Swelling in feet *Analgesics - Anti-Inflammatory*        Medication List     STOP taking these medications    cephALEXin  500 MG capsule Commonly known as: KEFLEX    HYDROcodone -acetaminophen  10-325 MG tablet Commonly known as: NORCO       TAKE these medications    albuterol  108 (90 Base) MCG/ACT inhaler Commonly known as: VENTOLIN  HFA Inhale 1-2 puffs into the lungs every 4 (four) hours as needed for shortness of breath.   apixaban  5 MG Tabs tablet Commonly known as: ELIQUIS  Take 1 tablet (5 mg total) by mouth 2 (two) times daily. What changed: additional instructions   ascorbic acid 500 MG tablet Commonly known as: VITAMIN C Take 1 tablet (500 mg total) by mouth 2 (two) times daily.   atorvastatin  20 MG tablet Commonly known as: LIPITOR Take 20 mg by mouth at bedtime.   benztropine  0.5 MG tablet Commonly known as: COGENTIN  Take 1 tablet (0.5 mg total) by mouth 2 (two) times daily.   busPIRone  10  MG tablet Commonly known as: BUSPAR  Take 1 tablet (10 mg total) by mouth 3 (three) times daily.   cholecalciferol 10 MCG (400 UNIT) Tabs tablet Commonly known as: VITAMIN D3 Take 1 tablet (400 Units total) by mouth daily. Start taking on: April 04, 2024   divalproex  500 MG DR tablet Commonly known as: DEPAKOTE  Take 1 tablet (500  mg total) by mouth 2 (two) times daily.   docusate sodium  100 MG capsule Commonly known as: COLACE Take 1 capsule (100 mg total) by mouth 2 (two) times daily as needed for mild constipation.   furosemide  40 MG tablet Commonly known as: LASIX  Take 1 tablet (40 mg total) by mouth daily.   HYDROmorphone 2 MG tablet Commonly known as: Dilaudid Take 0.5 tablets (1 mg total) by mouth every 6 (six) hours as needed for up to 5 days for severe pain (pain score 7-10).   lisinopril -hydrochlorothiazide  10-12.5 MG tablet Commonly known as: ZESTORETIC Take 1 tablet by mouth daily.   metoprolol  tartrate 25 MG tablet Commonly known as: LOPRESSOR  Take 1.5 tablets (37.5 mg total) by mouth 2 (two) times daily.   ondansetron  4 MG disintegrating tablet Commonly known as: ZOFRAN -ODT Take 1 tablet (4 mg total) by mouth every 8 (eight) hours as needed for vomiting.   Otezla 30 MG Tabs Generic drug: Apremilast Take 1 tablet (30 mg total) by mouth daily.   OXYGEN  Inhale 2 L into the lungs continuous.   pantoprazole  40 MG tablet Commonly known as: PROTONIX  Take 1 tablet (40 mg total) by mouth daily. Start taking on: April 04, 2024   QUEtiapine  50 MG tablet Commonly known as: SEROQUEL  TAKE 1/2 TABLET TWICE DAILY AND 1 TABLET AT BEDTIME   sertraline  100 MG tablet Commonly known as: ZOLOFT  Take 2 tablets (200 mg total) by mouth daily.   tiZANidine  4 MG tablet Commonly known as: ZANAFLEX  Take 1 tablet (4 mg total) by mouth every 6 (six) hours as needed for muscle spasms. What changed: when to take this        Follow-up Information     Bevely Doffing, FNP. Schedule an appointment as soon as possible for a visit in 1 week(s).   Specialty: Family Medicine Contact information: [address] [place] [address] [phone]         [facility]. Schedule an appointment as soon as possible for a visit in 1 month(s).   Specialty: Pulmonology Contact  information: [address] [place]  [address] [phone] Additional information: [address]  [address]  [place], [place] [address]               Discharge Exam: Fredricka Weights   04/01/24 9385 04/02/24 0500 04/03/24 0433  Weight: 82.1 kg 81.6 kg 81.8 kg   Constitutional: NAD, calm, comfortable, nasal oxygen  cannula in place Eyes: PERRL, lids and conjunctivae normal ENMT: Mucous membranes are moist. Posterior pharynx clear of any exudate or lesions.Normal dentition.  Neck: normal, supple, no masses, no thyromegaly Respiratory: clear to auscultation bilaterally, no wheezing, no crackles. Normal respiratory effort. No accessory muscle use.  Cardiovascular: Regular rate and rhythm, no murmurs / rubs / gallops. No extremity edema. 2+ pedal pulses. No carotid bruits.  Abdomen: no tenderness, no masses palpated. No hepatosplenomegaly. Bowel sounds positive.  Musculoskeletal: no clubbing / cyanosis. No joint deformity upper and lower extremities. Good ROM, no contractures. Normal muscle tone.  Skin: no rashes, lesions, ulcers. No induration Neurologic: CN 2-12 grossly intact.  Sensation intact, DTR normal. Strength 5/5 x all 4 extremities.  Psychiatric: Normal judgment and insight. Alert and oriented x 3. Normal mood.    Condition at discharge: good  The results of significant diagnostics from this hospitalization (including imaging, microbiology, ancillary and laboratory) are listed below for reference.   Imaging Studies: DG Abd 1 View Result Date: 04/02/2024 CLINICAL DATA:  Abdominal pain with tenderness.  Pancreatitis. EXAM: ABDOMEN - 1 VIEW COMPARISON:  Abdominopelvic CT 03/28/2024. Abdominal radiographs 02/07/2020. FINDINGS: There is a normal nonobstructive bowel gas pattern. Residual contrast material is present within the distal colon. No extravasated enteric contrast identified. No evidence of pneumoperitoneum. There are degenerative changes in  the spine and right hip. IMPRESSION: No evidence of bowel obstruction or other acute process. Residual contrast material in the distal colon. Electronically Signed   By: Elsie Perone M.D.   On: 04/02/2024 14:08   DG CHEST PORT 1 VIEW Result Date: 03/30/2024 CLINICAL DATA:  Hypoxia.  Shortness of breath. EXAM: PORTABLE CHEST 1 VIEW COMPARISON:  Radiograph 03/27/2023. Lung bases from abdominopelvic CT 03/28/2024 FINDINGS: Stable heart size and mediastinal contours. Small right pleural effusion with subsegmental atelectasis at the right lung base, similar to recent CT. Increasing atelectasis at the left lung base. Background bronchial thickening. Improved vascular congestion. No pneumothorax. Remote left rib fracture. IMPRESSION: 1. Small right pleural effusion with subsegmental atelectasis at the right lung base, similar to recent CT. 2. Increasing atelectasis at the left lung base. 3. Improved vascular congestion. Electronically Signed   By: Andrea Gasman M.D.   On: 03/30/2024 10:44   ECHOCARDIOGRAM COMPLETE Result Date: 03/28/2024    ECHOCARDIOGRAM REPORT   Patient Name:   AURY SCOLLARD Date of Exam: 03/28/2024 Medical Rec #:  [id number]         Height:       65.0 in Accession #:    [id number]        Weight:       200.6 lb Date of Birth:  02-19-1956         BSA:          1.981 m Patient Age:    68 years          BP:           124/66 mmHg Patient Gender: F                 HR:           82 bpm. Exam Location:  [facility] Procedure: 2D Echo, Cardiac Doppler and Color Doppler (Both Spectral and Color            Flow Doppler were utilized during procedure). Indications:    R06.02 SOB; I50.40* Unspecified combined systolic (congestive)                 and diastolic (congestive) heart failure  History:        Patient has prior history of Echocardiogram examinations, most                 recent 09/09/2023. Abnormal ECG, COPD, Aortic Valve Disease and                 Mitral Valve Disease, Arrythmias:Atrial  Flutter; Risk                 Factors:Former Smoker, Hypertension, Diabetes and Dyslipidemia.  Sonographer:    Ellouise Mose RDCS Referring Phys: [id number] [facility] Starlee Corralejo  Sonographer Comments: Suboptimal parasternal window and Technically difficult  study due to poor echo windows. Covid positive. Attempted to turn. IMPRESSIONS  1. Left ventricular ejection fraction, by estimation, is 60 to 65%. The left ventricle has normal function. Left ventricular endocardial border not optimally defined to evaluate regional wall motion. Left ventricular diastolic parameters are consistent with Grade I diastolic dysfunction (impaired relaxation). Elevated left atrial pressure.  2. Right ventricular systolic function is normal. The right ventricular size is normal.  3. Left atrial size was mildly dilated.  4. The mitral valve is degenerative. Mild to moderate mitral valve regurgitation. Mild mitral stenosis. The mean mitral valve gradient is 3.5 mmHg. Severe mitral annular calcification.  5. The aortic valve was not well visualized. Aortic valve regurgitation is not visualized. Mild aortic valve stenosis. Aortic valve area, by VTI measures 2.23 cm. Aortic valve mean gradient measures 14.6 mmHg. Aortic valve Vmax measures 2.60 m/s.  6. The inferior vena cava is normal in size with greater than 50% respiratory variability, suggesting right atrial pressure of 3 mmHg. FINDINGS  Left Ventricle: Left ventricular ejection fraction, by estimation, is 60 to 65%. The left ventricle has normal function. Left ventricular endocardial border not optimally defined to evaluate regional wall motion. The left ventricular internal cavity size was normal in size. There is no left ventricular hypertrophy. Left ventricular diastolic parameters are consistent with Grade I diastolic dysfunction (impaired relaxation). Elevated left atrial pressure. Right Ventricle: The right ventricular size is normal. No increase in right ventricular wall thickness. Right  ventricular systolic function is normal. Left Atrium: Left atrial size was mildly dilated. Right Atrium: Right atrial size was normal in size. Pericardium: There is no evidence of pericardial effusion. Mitral Valve: The mitral valve is degenerative in appearance. There is moderate thickening of the mitral valve leaflet(s). There is moderate calcification of the mitral valve leaflet(s). Severe mitral annular calcification. Mild to moderate mitral valve regurgitation. Mild mitral valve stenosis. MV peak gradient, 9.5 mmHg. The mean mitral valve gradient is 3.5 mmHg. Tricuspid Valve: The tricuspid valve is normal in structure. Tricuspid valve regurgitation is not demonstrated. No evidence of tricuspid stenosis. Aortic Valve: The aortic valve was not well visualized. Aortic valve regurgitation is not visualized. Mild aortic stenosis is present. Aortic valve mean gradient measures 14.6 mmHg. Aortic valve peak gradient measures 27.0 mmHg. Aortic valve area, by VTI  measures 2.23 cm. Pulmonic Valve: The pulmonic valve was not well visualized. Pulmonic valve regurgitation is not visualized. No evidence of pulmonic stenosis. Aorta: The aortic root was not well visualized. Venous: The inferior vena cava is normal in size with greater than 50% respiratory variability, suggesting right atrial pressure of 3 mmHg. IAS/Shunts: The interatrial septum was not well visualized.  LEFT VENTRICLE PLAX 2D LVIDd:         4.90 cm     Diastology LVIDs:         3.30 cm     LV e' medial:    4.79 cm/s LV PW:         0.90 cm     LV E/e' medial:  21.7 LV IVS:        1.00 cm     LV e' lateral:   4.90 cm/s LVOT diam:     2.30 cm     LV E/e' lateral: 21.2 LV SV:         109 LV SV Index:   55 LVOT Area:     4.15 cm  LV Volumes (MOD) LV vol d, MOD A2C: 35.3 ml LV vol  d, MOD A4C: 50.7 ml LV vol s, MOD A2C: 12.4 ml LV vol s, MOD A4C: 16.0 ml LV SV MOD A2C:     22.9 ml LV SV MOD A4C:     50.7 ml LV SV MOD BP:      28.6 ml RIGHT VENTRICLE            IVC  RV S prime:     9.90 cm/s  IVC diam: 1.40 cm TAPSE (M-mode): 1.5 cm LEFT ATRIUM             Index        RIGHT ATRIUM           Index LA diam:        3.60 cm 1.82 cm/m   RA Area:     13.50 cm LA Vol (A2C):   70.7 ml 35.69 ml/m  RA Volume:   28.80 ml  14.54 ml/m LA Vol (A4C):   71.6 ml 36.15 ml/m LA Biplane Vol: 70.8 ml 35.74 ml/m  AORTIC VALVE AV Area (Vmax):    2.24 cm AV Area (Vmean):   2.11 cm AV Area (VTI):     2.23 cm AV Vmax:           259.60 cm/s AV Vmean:          175.600 cm/s AV VTI:            0.488 m AV Peak Grad:      27.0 mmHg AV Mean Grad:      14.6 mmHg LVOT Vmax:         140.00 cm/s LVOT Vmean:        89.200 cm/s LVOT VTI:          0.262 m LVOT/AV VTI ratio: 0.54 MITRAL VALVE MV Area (PHT): 3.31 cm     SHUNTS MV Area VTI:   3.09 cm     Systemic VTI:  0.26 m MV Peak grad:  9.5 mmHg     Systemic Diam: 2.30 cm MV Mean grad:  3.5 mmHg MV Vmax:       1.54 m/s MV Vmean:      89.7 cm/s MV Decel Time: 229 msec MV E velocity: 104.00 cm/s MV A velocity: 137.00 cm/s MV E/A ratio:  0.76 Dorn Ross MD Electronically signed by Dorn Ross MD Signature Date/Time: 03/28/2024/2:52:05 PM    Final    CT ABDOMEN PELVIS WO CONTRAST Result Date: 03/28/2024 EXAM: CT ABDOMEN AND PELVIS WITHOUT CONTRAST 03/28/2024 12:10:21 PM TECHNIQUE: CT of the abdomen and pelvis was performed without the administration of intravenous contrast. Multiplanar reformatted images are provided for review. Automated exposure control, iterative reconstruction, and/or weight-based adjustment of the mA/kV was utilized to reduce the radiation dose to as low as reasonably achievable. COMPARISON: Abdominopelvic CT of 07/21/2023. CLINICAL HISTORY: Abdominal pain, acute, nonlocalized; abd pain, tenderness, constipation. FINDINGS: LOWER CHEST: Right base subsegmental atelectasis. Mild cardiomegaly with mitral annular calcification. Minimal right pleural fluid. LIVER: The liver is unremarkable. GALLBLADDER AND BILE DUCTS: Gallbladder  is unremarkable. Mild common duct dilatation is similar at 11 mm on image 27/2. SPLEEN: No acute abnormality. PANCREAS: Peripancreatic edema is moderate adjacent to the head on image 29/2, more mild adjacent to the body and tail on image 23/2. No duct dilatation. ADRENAL GLANDS: No acute abnormality. KIDNEYS, URETERS AND BLADDER: No renal calculi, hydronephrosis, bladder or ureteric stone. GI AND BOWEL: Normal stomach, without wall thickening. Normal colon and terminal ileum. Prior enterotomy with surrounding mild small bowel  dilatation on image 53/2, likely secondary atony. Ventral abdominal wall hernia repair with residual laxity containing nonobstructing bowel. PERITONEUM AND RETROPERITONEUM: Trace abdominal and small volume pelvic fluid. No free intraperitoneal air. VASCULATURE: Aorta is normal in caliber. Aortic atherosclerosis. LYMPH NODES: No lymphadenopathy. REPRODUCTIVE ORGANS: Hysterectomy. Moderate pelvic floor laxity. BONES AND SOFT TISSUES: Mild degenerative changes of the hips. Trace L4-L5 anterolisthesis. No focal soft tissue abnormality. IMPRESSION: 1. Mild to moderate noncomplicated pancreatitis. 2. Small volume abdominopelvic ascites and trace right pleural fluid. 3. Similar mild common duct dilatation. if the bilirubin is elevated, consider MRCP. 4. . Aortic atherosclerosis (icd10-i70.0). Electronically signed by: Rockey Kilts MD 03/28/2024 01:25 PM EST RP Workstation: [id number]   DG Chest Port 1 View Result Date: 03/26/2024 CLINICAL DATA:  Shortness of breath and productive cough. EXAM: PORTABLE CHEST 1 VIEW COMPARISON:  July 21, 2023 FINDINGS: The cardiac silhouette is mildly enlarged and unchanged in size. Low lung volumes are noted. There is prominence of the pulmonary vasculature with mildly increased interstitial lung markings. Mild atelectatic changes are seen within the bilateral lung bases. No pleural effusion or pneumothorax is identified. A chronic seventh left rib fracture is  seen. Multilevel degenerative changes are present throughout the thoracic spine. IMPRESSION: 1. Low lung volumes with mild bibasilar atelectasis. 2. Mild cardiomegaly with mild pulmonary vascular congestion. A mild component of associated interstitial edema cannot be excluded. Electronically Signed   By: Suzen Dials M.D.   On: 03/26/2024 17:49    Microbiology: Results for orders placed or performed during the hospital encounter of 03/26/24  Resp panel by RT-PCR (RSV, Flu A&B, Covid) Anterior Nasal Swab     Status: Abnormal   Collection Time: 03/26/24  5:06 PM   Specimen: Anterior Nasal Swab  Result Value Ref Range Status   SARS Coronavirus 2 by RT PCR POSITIVE (A) NEGATIVE Final    Comment: (NOTE) SARS-CoV-2 target nucleic acids are DETECTED.  The SARS-CoV-2 RNA is generally detectable in upper respiratory specimens during the acute phase of infection. Positive results are indicative of the presence of the identified virus, but do not rule out bacterial infection or co-infection with other pathogens not detected by the test. Clinical correlation with patient history and other diagnostic information is necessary to determine patient infection status. The expected result is Negative.  Fact Sheet for Patients: bloggercourse.com  Fact Sheet for Healthcare Providers: seriousbroker.it  This test is not yet approved or cleared by the [place]  FDA and  has been authorized for detection and/or diagnosis of SARS-CoV-2 by FDA under an Emergency Use Authorization (EUA).  This EUA will remain in effect (meaning this test can be used) for the duration of  the COVID-19 declaration under Section 564(b)(1) of the A ct, 21 U.S.C. section 360bbb-3(b)(1), unless the authorization is terminated or revoked sooner.     Influenza A by PCR NEGATIVE NEGATIVE Final   Influenza B by PCR NEGATIVE NEGATIVE Final    Comment: (NOTE) The Xpert Xpress  SARS-CoV-2/FLU/RSV plus assay is intended as an aid in the diagnosis of influenza from Nasopharyngeal swab specimens and should not be used as a sole basis for treatment. Nasal washings and aspirates are unacceptable for Xpert Xpress SARS-CoV-2/FLU/RSV testing.  Fact Sheet for Patients: bloggercourse.com  Fact Sheet for Healthcare Providers: seriousbroker.it  This test is not yet approved or cleared by the [place]  FDA and has been authorized for detection and/or diagnosis of SARS-CoV-2 by FDA under an Emergency Use Authorization (EUA). This EUA will remain in effect (meaning  this test can be used) for the duration of the COVID-19 declaration under Section 564(b)(1) of the Act, 21 U.S.C. section 360bbb-3(b)(1), unless the authorization is terminated or revoked.     Resp Syncytial Virus by PCR NEGATIVE NEGATIVE Final    Comment: (NOTE) Fact Sheet for Patients: bloggercourse.com  Fact Sheet for Healthcare Providers: seriousbroker.it  This test is not yet approved or cleared by the [place]  FDA and has been authorized for detection and/or diagnosis of SARS-CoV-2 by FDA under an Emergency Use Authorization (EUA). This EUA will remain in effect (meaning this test can be used) for the duration of the COVID-19 declaration under Section 564(b)(1) of the Act, 21 U.S.C. section 360bbb-3(b)(1), unless the authorization is terminated or revoked.  Performed at [facility], [address], [place], [place] [address]     Labs: CBC: Recent Labs  Lab 03/29/24 0441 04/01/24 0443  WBC 5.4 6.6  NEUTROABS 4.1 4.0  HGB 9.7* 11.6*  HCT 31.2* 36.7  MCV 93.4 90.0  PLT 145* 146*   Basic Metabolic Panel: Recent Labs  Lab 03/29/24 0441 04/01/24 0443  NA 142 143  K 4.3 4.0  CL 102 103  CO2 34* 33*  GLUCOSE 80 91  BUN 53* 30*  CREATININE 0.85 0.66  CALCIUM  8.9 9.1   Liver  Function Tests: No results for input(s): AST, ALT, ALKPHOS, BILITOT, PROT, ALBUMIN in the last 168 hours. CBG: No results for input(s): GLUCAP in the last 168 hours.  Discharge time spent: 45 minutes.  Signed: Deliliah Room, MD [facility] 04/03/2024

## 2024-04-03 NOTE — Plan of Care (Signed)
  Problem: Education: Goal: Knowledge of General Education information will improve Description: Including pain rating scale, medication(s)/side effects and non-pharmacologic comfort measures Outcome: Progressing   Problem: Clinical Measurements: Goal: Ability to maintain clinical measurements within normal limits will improve Outcome: Progressing Goal: Will remain free from infection Outcome: Progressing Goal: Diagnostic test results will improve Outcome: Progressing Goal: Respiratory complications will improve Outcome: Progressing   Problem: Nutrition: Goal: Adequate nutrition will be maintained Outcome: Progressing   Problem: Coping: Goal: Level of anxiety will decrease Outcome: Progressing   Problem: Pain Managment: Goal: General experience of comfort will improve and/or be controlled Outcome: Progressing   Problem: Safety: Goal: Ability to remain free from injury will improve Outcome: Progressing

## 2024-04-03 NOTE — Care Management Important Message (Signed)
 Important Message  Patient Details  Name: [name] [name] MRN: [id number] Date of Birth: 1955-10-24   Important Message Given:  Yes - Medicare IM     [name] [name] 04/03/2024, 12:04 PM

## 2024-04-03 NOTE — Telephone Encounter (Signed)
 Copied from CRM [id number]. Topic: Appointments - Scheduling Inquiry for Clinic >> Apr 03, 2024  1:32 PM Jasmin G wrote: Reason for CRM: [name] called to schedule a Hospital follow up appt, I offered next available on Dec 23rd and she requested a call back at [phone] to discuss possible sooner availability.

## 2024-04-03 NOTE — Telephone Encounter (Signed)
 Patients daughter called and stated  the dr at the hospital requested patient to follow up with her pcp asap. I informed pt the next available hfu was dec 3. Pt requested she be seen sooner do to her conditions requesting for a 20 min  hfu slot  11/19 at 10am with laura ? she was discharged today

## 2024-04-04 ENCOUNTER — Telehealth: Payer: Self-pay

## 2024-04-04 NOTE — Telephone Encounter (Signed)
 Okay to put her in with me on 11/19

## 2024-04-04 NOTE — Telephone Encounter (Signed)
 Noted. Appointment scheduled.patient verbally notified

## 2024-04-04 NOTE — Transitions of Care (Post Inpatient/ED Visit) (Signed)
   04/04/2024  Name: Hikari Tripp MRN: [id number] DOB: 01-11-1956  Today's TOC FU Call Status: Today's TOC FU Call Status:: Unsuccessful Call (1st Attempt) Unsuccessful Call (1st Attempt) Date: 04/04/24  Attempted to reach the patient regarding the most recent Inpatient/ED visit. Left a HIPAA approved voicemail message to phone number provided in demographics per DPR.    Follow Up Plan: Additional outreach attempts will be made to reach the patient to complete the Transitions of Care (Post Inpatient/ED visit) call.   Richerd Fish, RN, BSN, CCM [facility], [facility] Management Coordinator Direct Dial: [phone]

## 2024-04-05 ENCOUNTER — Telehealth: Payer: Self-pay

## 2024-04-05 NOTE — Transitions of Care (Post Inpatient/ED Visit) (Signed)
   04/05/2024  Name: [name] [name] MRN: [id number] DOB: 1956/02/06  Today's TOC FU Call Status: Today's TOC FU Call Status:: Unsuccessful Call (2nd Attempt) Unsuccessful Call (2nd Attempt) Date: 04/05/24  Attempted to reach the patient regarding the most recent Inpatient/ED visit. Left a HIPAA approved voicemail message to phone number provided in demographics per DPR.    Follow Up Plan: Additional outreach attempts will be made to reach the patient to complete the Transitions of Care (Post Inpatient/ED visit) call.   Richerd Fish, RN, BSN, CCM [facility], [facility] Management Coordinator Direct Dial: [phone]

## 2024-04-08 ENCOUNTER — Telehealth: Payer: Self-pay

## 2024-04-08 ENCOUNTER — Telehealth: Payer: Self-pay | Admitting: *Deleted

## 2024-04-08 ENCOUNTER — Ambulatory Visit (HOSPITAL_COMMUNITY): Admitting: Registered Nurse

## 2024-04-08 NOTE — Telephone Encounter (Signed)
 Please advise,  Copied from CRM #[id number]. Topic: Appointments - Scheduling Inquiry for Clinic >> Apr 08, 2024 10:45 AM Mercedes MATSU wrote: Reason for CRM: Patients daughter Raechelle Sarti called on behalf of the patient stating she has to work and can not bring the patient in for her 04/10/2024 appt. She states she is off 04/11/2024, and if it was possible for her to be squeezed in. Requesting call back today at [phone].

## 2024-04-08 NOTE — Transitions of Care (Post Inpatient/ED Visit) (Signed)
   04/08/2024  Name: [name] [name] MRN: [id number] DOB: [birth year]  Today's TOC FU Call Status: Today's TOC FU Call Status:: Unsuccessful Call (3rd Attempt) Unsuccessful Call (3rd Attempt) Date: 04/08/24  Attempted to reach the patient regarding the most recent Inpatient/ED visit.  Follow Up Plan: No further outreach attempts will be made at this time. We have been unable to contact the patient.  Mliss Creed [facility], BSN RN Care Manager/ Transition of Care [facility] [phone] f

## 2024-04-08 NOTE — Telephone Encounter (Signed)
 Leita ok to see Thursday 11/20 work in at 8 am

## 2024-04-10 ENCOUNTER — Ambulatory Visit

## 2024-04-11 ENCOUNTER — Ambulatory Visit (INDEPENDENT_AMBULATORY_CARE_PROVIDER_SITE_OTHER)

## 2024-04-11 VITALS — BP 118/68 | HR 62 | Ht 65.0 in | Wt 182.1 lb

## 2024-04-11 DIAGNOSIS — J9611 Chronic respiratory failure with hypoxia: Secondary | ICD-10-CM

## 2024-04-11 DIAGNOSIS — G894 Chronic pain syndrome: Secondary | ICD-10-CM | POA: Diagnosis not present

## 2024-04-11 MED ORDER — HYDROCODONE-ACETAMINOPHEN 10-325 MG PO TABS: 1.0000 | ORAL_TABLET | Freq: Three times a day (TID) | ORAL | 0 refills | Status: DC | PRN

## 2024-04-11 MED ORDER — DOCUSATE SODIUM 100 MG PO CAPS: 100.0000 mg | ORAL_CAPSULE | Freq: Two times a day (BID) | ORAL | 5 refills | Status: AC

## 2024-04-11 NOTE — Progress Notes (Signed)
 Established Patient Office Visit  Subjective   Patient ID: [name] [name], female    DOB: 1956-04-13  Age: 68 y.o. MRN: [id number]  Chief Complaint  Patient presents with   Hospitalization Follow-up    Hospital Follow up     HPI Discussed the use of AI scribe software for clinical note transcription with the patient, who gave verbal consent to proceed.  History of Present Illness    [name] [name] is a 68 year old female who presents with recent hospitalization for COVID-19, pneumonia, and pancreatitis.  Recent hospitalization and infectious symptoms - Hospitalized for one week for COVID-19, pneumonia, and pancreatitis - Initial symptoms began with high fever of 105F approximately one week after receiving a flu vaccination - Initial improvement followed by clinical deterioration, leading to diagnosis of COVID-19 and pneumonia  Pancreatic inflammation - Significant pancreatic inflammation identified during hospitalization - History of alcohol use - Good appetite - No significant recent weight loss  Bowel function - Currently taking docusate, one pill in the morning and one before bed, to aid with bowel movements  Pain management - Seeking refills for pain management medication      Patient Active Problem List   Diagnosis Date Noted   Acute on chronic respiratory failure with hypoxia (HCC) 03/26/2024   Plaque psoriasis 03/17/2024   SVT (supraventricular tachycardia) 10/09/2023   CAD (coronary artery disease) 09/06/2023   Mitral regurgitation and aortic stenosis 08/30/2023   Ascending aorta dilation 08/30/2023   Nicotine  use disorder 09/15/2022   AKI (acute kidney injury) 09/15/2022   Chronic anemia 09/15/2022   Mood disorder 09/15/2022   Hypermagnesemia 09/14/2022   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) 06/10/2022   Community acquired bacterial pneumonia 06/10/2022   Atrial flutter (HCC) 06/04/2022   Urinary retention 06/04/2022   Acute  hypoxic respiratory failure (HCC) 06/03/2022   Osteoarthritis of right hip 03/09/2022   Pain in joint of right hip 03/09/2022   Pain in joint of left knee 03/09/2022   Palliative care by specialist    DNR (do not resuscitate)    Body mass index 30.0-30.9, adult 06/12/2018   Bipolar 1 disorder (HCC) 01/11/2018   Healthcare maintenance 05/10/2017   Arthritis of left knee 05/10/2017   Essential hypertension 04/27/2017   Other insomnia 04/27/2017   Chronic pain syndrome 04/27/2017   Recurrent major depressive disorder, in partial remission 04/27/2017   Chronic, continuous use of opioids 04/27/2017   Headache syndrome 04/27/2017   COPD (chronic obstructive pulmonary disease) (HCC) 12/27/2012   Chronic respiratory failure (HCC) 12/27/2012   History of colonic polyps 12/11/2012   Chronic diarrhea 12/11/2012   Benign neoplasm of colon 12/11/2012   Migraine 12/04/2012   Alcohol abuse 12/04/2012   Hyperlipidemia 07/08/2012   COPD exacerbation (HCC) 04/13/2012   Low back pain 04/13/2012   Type 2 diabetes mellitus (HCC) 04/13/2012   Tobacco abuse 04/13/2012   Acute-on-chronic respiratory failure (HCC) 04/13/2012   Depression    Suicidal thoughts     ROS    Objective:     BP 118/68   Pulse 62   Ht 5' 5 (1.651 m)   Wt 182 lb 1.9 oz (82.6 kg)   SpO2 (!) 88%   BMI 30.31 kg/m  BP Readings from Last 3 Encounters:  04/11/24 118/68  04/03/24 (!) 154/73  03/15/24 120/74   Wt Readings from Last 3 Encounters:  04/11/24 182 lb 1.9 oz (82.6 kg)  04/03/24 180 lb 6.4 oz (81.8 kg)  03/15/24 178 lb (80.7  kg)      Physical Exam Vitals and nursing note reviewed. Exam conducted with a chaperone present (daughter is with her).  Constitutional:      Appearance: Normal appearance.  HENT:     Head: Normocephalic.     Right Ear: Tympanic membrane, ear canal and external ear normal.     Left Ear: Tympanic membrane, ear canal and external ear normal.     Nose: Nose normal.      Mouth/Throat:     Mouth: Mucous membranes are moist.     Pharynx: Oropharynx is clear.  Eyes:     Extraocular Movements: Extraocular movements intact.     Pupils: Pupils are equal, round, and reactive to light.  Cardiovascular:     Rate and Rhythm: Normal rate and regular rhythm.  Pulmonary:     Effort: Pulmonary effort is normal.     Breath sounds: Normal breath sounds.     Comments: 2 LPM of oxygen  Abdominal:     General: Bowel sounds are normal.     Palpations: Abdomen is soft.  Musculoskeletal:     Cervical back: Normal range of motion and neck supple.  Neurological:     Mental Status: She is alert and oriented to person, place, and time.  Psychiatric:        Mood and Affect: Mood normal.        Thought Content: Thought content normal.    No results found for any visits on 04/11/24.    The 10-year ASCVD risk score (Arnett DK, et al., 2019) is: 14.5%    Assessment & Plan:   Problem List Items Addressed This Visit       Respiratory   Chronic respiratory failure (HCC) - Primary   Stable on 2L of oxygen .  She reports improvement of recent Covid pneumonia symptoms.   Recommend f/u with pulmonary within one month.         Other   Chronic pain syndrome   Hydrocodone  refilled for chronic pain management.     PDMP reviewed during this encounter.  No signs of misuse.        Relevant Medications   HYDROcodone -acetaminophen  (NORCO) 10-325 MG tablet    No follow-ups on file.    Leita Longs, FNP

## 2024-04-13 ENCOUNTER — Other Ambulatory Visit: Payer: Self-pay

## 2024-04-13 DIAGNOSIS — N61 Mastitis without abscess: Secondary | ICD-10-CM

## 2024-04-14 NOTE — Assessment & Plan Note (Signed)
 Stable on 2L of oxygen .  She reports improvement of recent Covid pneumonia symptoms.   Recommend f/u with pulmonary within one month.

## 2024-04-14 NOTE — Assessment & Plan Note (Signed)
 Hydrocodone  refilled for chronic pain management.     PDMP reviewed during this encounter.  No signs of misuse.

## 2024-04-17 ENCOUNTER — Telehealth: Payer: Self-pay

## 2024-04-17 ENCOUNTER — Other Ambulatory Visit: Payer: Self-pay

## 2024-04-17 DIAGNOSIS — K861 Other chronic pancreatitis: Secondary | ICD-10-CM

## 2024-04-17 DIAGNOSIS — G894 Chronic pain syndrome: Secondary | ICD-10-CM

## 2024-04-17 DIAGNOSIS — R188 Other ascites: Secondary | ICD-10-CM

## 2024-04-17 MED ORDER — HYDROCODONE-ACETAMINOPHEN 10-325 MG PO TABS: 1.0000 | ORAL_TABLET | Freq: Four times a day (QID) | ORAL | 0 refills | Status: DC | PRN

## 2024-04-17 NOTE — Telephone Encounter (Signed)
 Copied from CRM #[id number]. Topic: Referral - Status >> Apr 17, 2024 10:36 AM Travis F wrote: Reason for CRM: Patient's daughter is calling in wanting to know the status of a referral for a Gastroenterologist. She says one was supposed to be put in but she hasn't heard anything back. Please advise.

## 2024-04-17 NOTE — Telephone Encounter (Signed)
 Has sent home in new prescription to [facility] and asked them to dispense additional 30 pills to equal 120.  Please reach out to them to see if they are able to do this.

## 2024-04-17 NOTE — Telephone Encounter (Signed)
 Referral sent to: [facility] at [facility] [address], [place] [address] [phone]  Once their Office reviews the Referral - Their Office will reach out to the Patient in regards to Appointment Scheduling.

## 2024-04-17 NOTE — Telephone Encounter (Signed)
 Referral to [facility] placed

## 2024-04-17 NOTE — Telephone Encounter (Signed)
 Referral placed for [facility]

## 2024-04-17 NOTE — Telephone Encounter (Signed)
 Copied from CRM #[id number]. Topic: Clinical - Prescription Issue >> Apr 17, 2024 10:40 AM Travis F wrote: Reason for CRM: Patient's daughter is calling in because patient picked up her prescription for HYDROcodone -acetaminophen  (NORCO) 10-325 MG tablet [[id number]] and says the dosage instructions are wrong. She says patient usually takes them 4x a times and the new instructions says to take one every 8 hours. Please advise.

## 2024-04-23 ENCOUNTER — Encounter (INDEPENDENT_AMBULATORY_CARE_PROVIDER_SITE_OTHER): Payer: Self-pay | Admitting: *Deleted

## 2024-04-23 NOTE — Telephone Encounter (Signed)
 So per [facility] Her insurance is stating that based of what she has already picked up with the updated RX ins said she should be good till the 10th of Dec

## 2024-04-29 ENCOUNTER — Encounter (INDEPENDENT_AMBULATORY_CARE_PROVIDER_SITE_OTHER): Payer: Self-pay | Admitting: *Deleted

## 2024-04-30 ENCOUNTER — Other Ambulatory Visit: Payer: Self-pay

## 2024-04-30 ENCOUNTER — Telehealth (INDEPENDENT_AMBULATORY_CARE_PROVIDER_SITE_OTHER): Payer: Self-pay

## 2024-04-30 ENCOUNTER — Ambulatory Visit (INDEPENDENT_AMBULATORY_CARE_PROVIDER_SITE_OTHER): Admitting: Gastroenterology

## 2024-04-30 ENCOUNTER — Encounter (INDEPENDENT_AMBULATORY_CARE_PROVIDER_SITE_OTHER): Payer: Self-pay | Admitting: Gastroenterology

## 2024-04-30 ENCOUNTER — Telehealth: Payer: Self-pay

## 2024-04-30 VITALS — BP 117/75 | HR 86 | Temp 97.0°F | Ht 65.0 in | Wt 179.1 lb

## 2024-04-30 DIAGNOSIS — R630 Anorexia: Secondary | ICD-10-CM | POA: Insufficient documentation

## 2024-04-30 DIAGNOSIS — K859 Acute pancreatitis without necrosis or infection, unspecified: Secondary | ICD-10-CM | POA: Insufficient documentation

## 2024-04-30 DIAGNOSIS — R1084 Generalized abdominal pain: Secondary | ICD-10-CM | POA: Insufficient documentation

## 2024-04-30 DIAGNOSIS — E782 Mixed hyperlipidemia: Secondary | ICD-10-CM

## 2024-04-30 DIAGNOSIS — G894 Chronic pain syndrome: Secondary | ICD-10-CM

## 2024-04-30 DIAGNOSIS — Z860100 Personal history of colon polyps, unspecified: Secondary | ICD-10-CM | POA: Insufficient documentation

## 2024-04-30 DIAGNOSIS — D500 Iron deficiency anemia secondary to blood loss (chronic): Secondary | ICD-10-CM | POA: Insufficient documentation

## 2024-04-30 MED ORDER — ATORVASTATIN CALCIUM 20 MG PO TABS: 20.0000 mg | ORAL_TABLET | Freq: Every day | ORAL | 3 refills | Status: AC

## 2024-04-30 MED ORDER — HYDROCODONE-ACETAMINOPHEN 10-325 MG PO TABS: 1.0000 | ORAL_TABLET | ORAL | 0 refills | Status: AC | PRN

## 2024-04-30 NOTE — Patient Instructions (Signed)
 It was very nice to meet you today, as dicussed with will plan for the following :  1) CT abdomen and pelvis  2) Upper endoscopy and colonoscopy

## 2024-04-30 NOTE — Telephone Encounter (Signed)
 PA on [facility] for CT : Precertification Not Required

## 2024-04-30 NOTE — Telephone Encounter (Signed)
    04/30/24  Corean Bacon May 16, 1956  What type of surgery is being performed? Colonoscopy and egd  When is surgery scheduled? tbd  What type of clearance is required (medical or pharmacy to hold medication or both? medication  Are there any medications that need to be held prior to surgery and how long? Eliquis , hold 2 days prior  Name of physician performing surgery?  Dr. Cinderella Rouse Gastroenterology at [facility] Phone: [phone] Fax: [phone]  Anethesia type (none, local, MAC, general)? MAC

## 2024-04-30 NOTE — Telephone Encounter (Signed)
 Of note, patient hospitalized for respiratory failure on 03/2024. Has not been seen by pulmonary yet.  Patient with diagnosis of afib/aflutter on Eliquis  for anticoagulation.    What type of surgery is being performed? Colonoscopy and EGD   When is surgery scheduled? TBD   CHA2DS2-VASc Score = 5   This indicates a 7.2% annual risk of stroke. The patient's score is based upon: CHF History: 1 HTN History: 1 Diabetes History: 1 Stroke History: 0 Vascular Disease History: 0 Age Score: 1 Gender Score: 1   CrCl 87 ml/min Platelet count 146 K  Patient has not had an Afib/aflutter ablation in the last 3 months, DCCV within the last 4 weeks or a watchman implanted in the last 45 days   Per office protocol, patient can hold Eliquis  for 2 days prior to procedure.    Patient will not need bridging with Lovenox  (enoxaparin ) around procedure.  **This guidance is not considered finalized until pre-operative APP has relayed final recommendations.**

## 2024-04-30 NOTE — Telephone Encounter (Signed)
 Spoke with patients daughter [name] (on [place]), scheduled CT for 05/09/2024 at 11am. Patients daughter aware and verbalized understanding.

## 2024-04-30 NOTE — Progress Notes (Signed)
 Mliss Wedin Faizan Sarp Vernier , M.D. Gastroenterology & Hepatology [facility] Gastroenterology [address] [place], [place] [address] Primary Care Physician: Bevely Doffing, FNP [address] [place] [address]  Chief Complaint: Anemia new onset pancreatitis, loss of appetite  History of Present Illness: Addisyn Leclaire is a 68 y.o. female with f hypertension, hyperlipidemia, hypothyroidism, chronic respiratory failure on 2 LPM of oxygen , COPD, atrial flutter/fibrillation on Eliquis , bipolar disorder  who presents for evaluation of Anemia new onset pancreatitis  Patient was last seen by Dr. Albertus in 2020  Patient had recent hospitalization with COVID-pneumonia complicated with pancreatitis. Patient denies any new medication or alcohol use.  Reports diffuse abdominal pain with significant bloating and rumbling noises.  Patient reports constipation since starting docusate she is having bowel movements daily. The patient denies having any nausea, vomiting, fever, chills, hematochezia, melena, hematemesis,  diarrhea, jaundice, pruritus or weight loss.  Patient had unintentional loss of appetite even before pancreatitis/COVID-pneumonia episode  Last ZHI:wnwz Last Colonoscopy:colonoscopy was performed on 12/11/2012. There was a 5 mm tubular adenoma removed from the cecum. 4 polyps were removed from the descending colon, rectosigmoid colon and rectum found to be hyperplastic. Random biopsies were unremarkable at that time.   Labs from 01/01/2024 AST less than 10 ALT 5 alk phos 62 T. bili 0.2 lipase 86 hemoglobin 11.6 Past Medical History: Past Medical History:  Diagnosis Date   Acute hypoxic respiratory failure (HCC) 06/03/2022   Acute respiratory failure (HCC) 04/13/2012   Acute respiratory failure with hypoxia and hypercapnia (HCC) 07/08/2012   Anxiety    Ascending aorta dilation 08/30/2023   TTE 08/30/2023:  ascending aorta 38 mm    Atrial fibrillation  with RVR (HCC) 02/05/2020   Atrial flutter (HCC) 06/04/2022   Bipolar 1 disorder (HCC)    CAD (coronary artery disease) 09/06/2023   CCTA 09/05/23: CAC score 8 (54th percentile), mild nonobstructive CAD [Dx min noncalc plaque; mLCx 1-24 (mixed)]; TPV 70 mm3 (30th percentile) - Ca2+ 5 mm3 / nonCa2+ 65 mm3 - TPV is mild; small PFO (L-R shunt)    Cancer (HCC)    colon   Cellulitis of right lower leg    Chronic diarrhea    Followed by Dr Dianna    Chronic pain 06/05/2022   Community acquired bacterial pneumonia 06/10/2022   Complication of anesthesia    Hx resp distress during surgery   COPD (chronic obstructive pulmonary disease) (HCC)    COPD exacerbation (HCC) 04/13/2012   Depression    Diabetes mellitus (HCC)    External hemorrhoids    Gangrene of toe of right foot (HCC)    GI bleed    Headache(784.0)    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) 06/10/2022   Hyperlipidemia    Hypertension    Hypothyroidism    Insomnia    Internal hemorrhoids    Mitral regurgitation and aortic stenosis 08/30/2023   TTE 08/30/2023: EF 60-65, no RWMA, GR 1 DD, normal RVSF, severe LAE, mild-moderate RAE, moderate MR, mild AV calcification, trivial AI, mild aortic stenosis (mean gradient 13.5, V-max 240 cm/s, DI 0.57), ascending aorta 38 mm    Necrotizing fasciitis of lower leg (HCC)    Non-insulin  dependent type 2 diabetes mellitus (HCC) 04/13/2012   Polysubstance abuse (HCC) Quit 2008    H/O methamphetamine and alcohol abuse, clean x 6 years   Sepsis (HCC) 01/26/2020   Shortness of breath    Suicidal thoughts    SVT (supraventricular tachycardia) 10/09/2023  Monitor 08/2023: Rare PACs; 59 runs of SVT (longest 15.2 seconds)    Tobacco abuse    Tubular adenoma of colon    Urinary retention 06/04/2022    Past Surgical History: Past Surgical History:  Procedure Laterality Date   ABDOMINAL HYSTERECTOMY     APPENDECTOMY     BUBBLE STUDY  06/10/2022   Procedure: BUBBLE STUDY;   Surgeon: Loni Soyla LABOR, MD;  Location[facility] ENDOSCOPY;  Service: Cardiovascular;;   CARDIOVERSION N/A 06/10/2022   Procedure: CARDIOVERSION;  Surgeon: Loni Soyla LABOR, MD;  Location: [facility] ENDOSCOPY;  Service: Cardiovascular;  Laterality: N/A;   COLON SURGERY     colon repair   COLONOSCOPY WITH PROPOFOL  N/A 12/11/2012   Procedure: COLONOSCOPY WITH PROPOFOL ;  Surgeon: Gordy CHRISTELLA Starch, MD;  Location: [facility] ENDOSCOPY;  Service: Gastroenterology;  Laterality: N/A;   FINGER SURGERY     middle left finger   HERNIA REPAIR     x 2   KNEE SURGERY Left    Left knee tendon surgery   TEE WITHOUT CARDIOVERSION N/A 06/10/2022   Procedure: TRANSESOPHAGEAL ECHOCARDIOGRAM (TEE);  Surgeon: Loni Soyla LABOR, MD;  Location: [facility] ENDOSCOPY;  Service: Cardiovascular;  Laterality: N/A;   TONSILLECTOMY     TUBAL LIGATION     WOUND DEBRIDEMENT Right 02/06/2020   Procedure: SKIN BIOPSY WITH DEBRIDEMENT RIGHT LOWER LEG;  Surgeon: Kallie Manuelita BROCKS, MD;  Location: [facility] ORS;  Service: General;  Laterality: Right;    Family History: Family History  Problem Relation Age of Onset   Multiple sclerosis Mother    Alcoholism Father    Depression Brother    Emphysema Maternal Grandfather        smoked   Breast cancer Neg Hx    Colon cancer Neg Hx    Esophageal cancer Neg Hx    Colon polyps Neg Hx    Stomach cancer Neg Hx    Pancreatic cancer Neg Hx    Liver disease Neg Hx     Social History: Social History   Tobacco Use  Smoking Status Former   Current packs/day: 1.00   Average packs/day: 1 pack/day for 42.0 years (42.0 ttl pk-yrs)   Types: Cigarettes  Smokeless Tobacco Former   Quit date: 2021   Social History   Substance and Sexual Activity  Alcohol Use Not Currently   Comment: occ   Social History   Substance and Sexual Activity  Drug Use No    Allergies: Allergies  Allergen Reactions   Bactrim [Sulfamethoxazole -Trimethoprim] Anaphylaxis   Penicillins Shortness Of Breath   Amoxicillin  Hives    Diclofenac Hives, Swelling and Tinitus    Swelling in feet *Analgesics - Anti-Inflammatory*    Medications: Current Outpatient Medications  Medication Sig Dispense Refill   albuterol  (VENTOLIN  HFA) 108 (90 Base) MCG/ACT inhaler Inhale 1-2 puffs into the lungs every 4 (four) hours as needed for shortness of breath.     apixaban  (ELIQUIS ) 5 MG TABS tablet Take 1 tablet (5 mg total) by mouth 2 (two) times daily. 180 tablet 3   atorvastatin  (LIPITOR) 20 MG tablet Take 20 mg by mouth at bedtime.     benztropine  (COGENTIN ) 0.5 MG tablet Take 1 tablet (0.5 mg total) by mouth 2 (two) times daily. 60 tablet 5   busPIRone  (BUSPAR ) 10 MG tablet Take 1 tablet (10 mg total) by mouth 3 (three) times daily. 90 tablet 5   divalproex  (DEPAKOTE ) 500 MG DR tablet Take 1 tablet (500 mg total) by mouth 2 (two) times daily. 60  tablet 5   docusate sodium  (COLACE) 100 MG capsule Take 1 capsule (100 mg total) by mouth 2 (two) times daily. 60 capsule 5   furosemide  (LASIX ) 40 MG tablet Take 1 tablet (40 mg total) by mouth daily. 30 tablet 0   HYDROcodone -acetaminophen  (NORCO) 10-325 MG tablet Take 1 tablet by mouth every 6 (six) hours as needed for severe pain (pain score 7-10). 30 tablet 0   lisinopril -hydrochlorothiazide  (ZESTORETIC) 10-12.5 MG tablet Take 1 tablet by mouth daily.     ondansetron  (ZOFRAN -ODT) 4 MG disintegrating tablet Take 1 tablet (4 mg total) by mouth every 8 (eight) hours as needed for vomiting. 30 tablet 0   OXYGEN  Inhale 2 L into the lungs continuous.     pantoprazole  (PROTONIX ) 40 MG tablet Take 1 tablet (40 mg total) by mouth daily. 10 tablet 0   QUEtiapine  (SEROQUEL ) 50 MG tablet TAKE 1/2 TABLET TWICE DAILY AND 1 TABLET AT BEDTIME 180 tablet 0   sertraline  (ZOLOFT ) 100 MG tablet Take 2 tablets (200 mg total) by mouth daily. 60 tablet 5   tiZANidine  (ZANAFLEX ) 4 MG tablet Take 1 tablet (4 mg total) by mouth every 6 (six) hours as needed for muscle spasms. 120 tablet 5   ascorbic acid   (VITAMIN C ) 500 MG tablet Take 1 tablet (500 mg total) by mouth 2 (two) times daily. (Patient not taking: Reported on 04/30/2024) 30 tablet 0   cholecalciferol  (VITAMIN D3) 10 MCG (400 UNIT) TABS tablet Take 1 tablet (400 Units total) by mouth daily. (Patient not taking: Reported on 04/30/2024) 30 tablet 0   metoprolol  tartrate (LOPRESSOR ) 25 MG tablet Take 1.5 tablets (37.5 mg total) by mouth 2 (two) times daily. (Patient not taking: Reported on 04/30/2024) 90 tablet 11   No current facility-administered medications for this visit.    Review of Systems: GENERAL: negative for malaise, night sweats HEENT: No changes in hearing or vision, no nose bleeds or other nasal problems. NECK: Negative for lumps, goiter, pain and significant neck swelling RESPIRATORY: Negative for cough, wheezing CARDIOVASCULAR: Negative for chest pain, leg swelling, palpitations, orthopnea GI: SEE HPI MUSCULOSKELETAL: Negative for joint pain or swelling, back pain, and muscle pain. SKIN: Negative for lesions, rash HEMATOLOGY Negative for prolonged bleeding, bruising easily, and swollen nodes. ENDOCRINE: Negative for cold or heat intolerance, polyuria, polydipsia and goiter. NEURO: negative for tremor, gait imbalance, syncope and seizures. The remainder of the review of systems is noncontributory.   Physical Exam: BP 117/75   Pulse 86   Temp (!) 97 F (36.1 C)   Ht 5' 5 (1.651 m)   Wt 179 lb 1.6 oz (81.2 kg)   BMI 29.80 kg/m  GENERAL: The patient is AO x3, in no acute distress. HEENT: Head is normocephalic and atraumatic. EOMI are intact. Mouth is well hydrated and without lesions. NECK: Supple. No masses LUNGS: Clear to auscultation. No presence of rhonchi/wheezing/rales. Adequate chest expansion HEART: RRR, normal s1 and s2. ABDOMEN: Soft, nontender, no guarding, no peritoneal signs, and nondistended. BS +. No masses.  Imaging/Labs: as above     Latest Ref Rng & Units 04/01/2024    4:43 AM 03/29/2024     4:41 AM 03/27/2024    4:19 AM  CBC  WBC 4.0 - 10.5 K/uL 6.6  5.4  3.4   Hemoglobin 12.0 - 15.0 g/dL 88.3  9.7  9.5   Hematocrit 36.0 - 46.0 % 36.7  31.2  30.8   Platelets 150 - 400 K/uL 146  145  143  No results found for: IRON, TIBC, FERRITIN  I personally reviewed and interpreted the available labs, imaging and endoscopic files.  Ct Abdomen pelvis without contrast   IMPRESSION: 1. Mild to moderate noncomplicated pancreatitis. 2. Small volume abdominopelvic ascites and trace right pleural fluid. 3. Similar mild common duct dilatation. if the bilirubin is elevated, consider MRCP. 4. . Aortic atherosclerosis (icd10-i70.0).  Impression and Plan:  Israel Wunder is a 68 y.o. female with f hypertension, hyperlipidemia, hypothyroidism, chronic respiratory failure on 2 LPM of oxygen , COPD, atrial flutter/fibrillation on Eliquis , bipolar disorder  who presents for evaluation of Anemia new onset pancreatitis  # Loss of appetite # Pancreatitis # Anemia # History of colon polyps  Patient had new onset pancreatitis in setting of COVID which could be COVID induced pancreatitis well-established in the literature  Although advanced age and CBD dilation imperative to rule out underlying lesion. Patient unable to obtain MRI as she had prosthetic in her finger 30+ years ago and unknown if it is MRI compatible  On exam today has diffuse abdominal tenderness: Will obtain CT abdomen pelvis with and without contrast pancreatic protocol stat  Strict ED precautions given if fever or worsening pain etc  Patient has loss of appetite history of colonic polyps and anemia or indication for bidirectional endoscopy  Will obtain cardiology restratification and clearance to hold Eliquis  for 2 days  I thoroughly discussed with the patient the procedure, including the risks involved. Patient understands what the procedure involves including the benefits and any risks. Patient understands  alternatives to the proposed procedure. Risks including (but not limited to) bleeding, tearing of the lining (perforation), rupture of adjacent organs, problems with heart and lung function, infection, and medication reactions. A small percentage of complications may require surgery, hospitalization, repeat endoscopic procedure, and/or transfusion.  Patient understood and agreed.     All questions were answered.      Assessment Tristyn Demarest Faizan Zoha Spranger, MD Gastroenterology and Hepatology [facility] Gastroenterology   This chart has been completed using [facility] Dictation software, and while attempts have been made to ensure accuracy , certain words and phrases may not be transcribed as intended

## 2024-04-30 NOTE — Progress Notes (Signed)
 Pharmacy Quality Measure Review  This patient is appearing on a report for being at risk of failing the adherence measure for cholesterol (statin) medications this calendar year.   Medication: Atorvastatin  20mg  Last fill date: 06/01 for 90 day supply  Spoke with daughter Lonell, who supports the patient in her medication management. Lonell advised me to call patient at her new phone number, [phone]. Spoke with patient, who states that she took the medication until it ran out, and was not aware that she needed to request a refill.  Informed the patient that atorvastatin  is a chronic medication, when she runs out, she should request her pharmacy to send a refill request to the prescriber. She can and should do this for all of her chronic medications when they run out.  Patient voiced understanding. Will reach out to clinic PharmD to facilitate refills. All questions by the patient were answered.  Jasa Dundon, PharmD [facility] Pharmacist

## 2024-05-01 NOTE — Telephone Encounter (Signed)
° °  Name: [name] [name]  DOB: Sep 22, 1955  MRN: [id number]   Primary Cardiologist: Alvan Carrier, MD  Chart reviewed as part of pre-operative protocol coverage.   We have been asked for guidance to hold oral anticoagulation for upcoming procedure. Per our clinical pharmacist:  CHA2DS2-VASc Score = 5   This indicates a 7.2% annual risk of stroke. The patient's score is based upon: CHF History: 1 HTN History: 1 Diabetes History: 1 Stroke History: 0 Vascular Disease History: 0 Age Score: 1 Gender Score: 1     CrCl 87 ml/min Platelet count 146 K   Patient has not had an Afib/aflutter ablation in the last 3 months, DCCV within the last 4 weeks or a watchman implanted in the last 45 days    Per office protocol, patient can hold Eliquis  for 2 days prior to procedure.     Patient will not need bridging with Lovenox  (enoxaparin ) around procedure.   I will route this recommendation to the requesting party via Epic fax function and remove from pre-op pool. Please call with questions.  Rollo FABIENE Louder, PA-C 05/01/2024, 8:48 AM

## 2024-05-08 ENCOUNTER — Other Ambulatory Visit (HOSPITAL_COMMUNITY)

## 2024-05-09 ENCOUNTER — Ambulatory Visit (HOSPITAL_COMMUNITY)

## 2024-05-09 DIAGNOSIS — R1084 Generalized abdominal pain: Secondary | ICD-10-CM | POA: Diagnosis present

## 2024-05-09 DIAGNOSIS — K859 Acute pancreatitis without necrosis or infection, unspecified: Secondary | ICD-10-CM | POA: Insufficient documentation

## 2024-05-09 MED ORDER — IOHEXOL 300 MG/ML  SOLN
100.0000 mL | Freq: Once | INTRAMUSCULAR | Status: AC | PRN
  Administered 2024-05-09: 11:00:00 100 mL via INTRAVENOUS

## 2024-05-10 ENCOUNTER — Ambulatory Visit (INDEPENDENT_AMBULATORY_CARE_PROVIDER_SITE_OTHER): Payer: Self-pay | Admitting: Gastroenterology

## 2024-05-10 NOTE — Progress Notes (Signed)
 Hi [name] [name], recommend procedure to be scheduled --------------------------------  Hi [name] [name] ,  Can you please call the patient and tell the patient or the daughter that CT did not show any pancreatic mass or any inflammation in the pancreas which is a good news.  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  In some cases it can cause recurrent pancreatitis but majority patient remain without any symptoms, we will continue to monitor this  For now I recommend scheduling upper endoscopy and colonoscopy as discussed in the clinic  Annlee Glandon Faizan Enola Siebers, MD Gastroenterology and Hepatology [facility] Gastroenterology ===========  CT Abdomen and Pelvis  IMPRESSION: 1. No acute pancreatitis. 2. No radiopaque gallstones or biliary ductal dilatation. 3. Variant pancreatic ductal anatomy (pancreas divisum), which can predispose to pancreatitis; however, no evidence of chronic pancreatitis is evident.

## 2024-05-23 DIAGNOSIS — F341 Dysthymic disorder: Secondary | ICD-10-CM

## 2024-05-23 DIAGNOSIS — F319 Bipolar disorder, unspecified: Secondary | ICD-10-CM

## 2024-06-03 ENCOUNTER — Telehealth: Payer: Self-pay

## 2024-06-03 ENCOUNTER — Ambulatory Visit: Payer: Self-pay

## 2024-06-03 ENCOUNTER — Other Ambulatory Visit: Payer: Self-pay

## 2024-06-03 DIAGNOSIS — G894 Chronic pain syndrome: Secondary | ICD-10-CM

## 2024-06-03 MED ORDER — HYDROCODONE-ACETAMINOPHEN 10-325 MG PO TABS: 1.0000 | ORAL_TABLET | ORAL | 0 refills | Status: AC | PRN

## 2024-06-03 NOTE — Telephone Encounter (Signed)
 See other encounter, message sent to PCP to advise

## 2024-06-03 NOTE — Telephone Encounter (Signed)
 Copied from CRM #[id number]. Topic: Clinical - Medication Question >> Jun 03, 2024  3:42 PM Fonda T wrote: Reason for CRM: Pt daughter is calling on behalf of pt. In regards to medication.  Medication: HYDROcodone -acetaminophen  (NORCO) 10-325 MG tablet   Calling stating she called earlier to office to inquire if medication can be prescribed for pt due to migraines, that taking Ibuprofen  is not helping.  Per chart review, medication was discontinued/last prescribed on 04/30/24.  Pt daughter requesting a return call by end of day with status update.  Daughter, Can be reached at [phone].   [id number] - [place], [place] - [address] AT [facility] [address] [place] [address] Phone: [phone] Fax: [phone]

## 2024-06-03 NOTE — Telephone Encounter (Signed)
 Disconnected during transfer to NT. First attempt made to reach [name], pt daughter. Mailbox full unable to leave message.  Copied from CRM #[id number]. Topic: Clinical - Red Word Triage >> Jun 03, 2024 10:04 AM Lonell PEDLAR wrote: Red Word that prompted transfer to Nurse Triage: Patient's daughter, Lonell, calls on her behalf, to report that patient is having really bad migraines, no improvement with Ibuprofen 

## 2024-06-03 NOTE — Telephone Encounter (Signed)
 FYI Only or Action Required?: Action required by provider: Refill on Hydrocodone -Acetaminophen , not on current med list.  Patient was last seen in primary care on 04/11/2024 by Bevely Doffing, FNP.  Called Nurse Triage reporting Migraine.  Symptoms began today.  Interventions attempted: OTC medications: Tylenol .  Symptoms are: unchanged.  Triage Disposition: See PCP When Office is Open (Within 3 Days)  Patient/caregiver understands and will follow disposition?: Yes Reason for Disposition  [1] MILD-MODERATE headache AND [2] present > 3 days (72 hours) AND [3] no improvement after using Care Advice  Answer Assessment - Initial Assessment Questions Patient's daughter Lonell calling in not currently with patient, called patient to continue triage In hospital beginning of November with Covid, more frequent since then. Daughter Lonell requesting an appointment on the 15th, scheduled with Dr. Tobie. Pt requesting refill on hydrocodone , not on current med list. Please advise. CB [phone]   1. LOCATION: Where does it hurt?      Behind eyes, temples  2. ONSET: When did the headache start? (e.g., minutes, hours, days)      3am this morning  3. PATTERN: Does the pain come and go, or has it been constant since it started?     Constant  4. SEVERITY: How bad is the pain? and What does it keep you from doing?  (e.g., Scale 1-10; mild, moderate, or severe)     7/10  5. RECURRENT SYMPTOM: Have you ever had headaches before? If Yes, ask: When was the last time? and What happened that time?      Yes, was given a sample packet in PCP office, cannot remember what it was. Was finding relief with that medication, now just taking tylenol  and hydrocodone -acetaminophen    6. MIGRAINE: Have you been diagnosed with migraine headaches? If Yes, ask: Is this headache similar?      Yes, with oura, will start with hearing heartbeat in ears then migraine follows  7. HEAD INJURY: Has there  been any recent injury to your head?      Denies  8. OTHER SYMPTOMS: Do you have any other symptoms? (e.g., fever, stiff neck, eye pain, sore throat, cold symptoms)     Grinding feeling in neck,  Protocols used: [facility]  Copied from CRM #[id number]. Topic: Clinical - Red Word Triage >> Jun 03, 2024 10:04 AM Lonell PEDLAR wrote: Red Word that prompted transfer to Nurse Triage: Patient's daughter, Lonell, calls on her behalf, to report that patient is having really bad migraines, no improvement with Ibuprofen  >> Jun 03, 2024  1:02 PM Tinnie BROCKS wrote: Lonell returning Nts call. Wants hydrocodone  filled.

## 2024-06-06 ENCOUNTER — Ambulatory Visit: Payer: Self-pay | Admitting: Internal Medicine

## 2024-06-20 ENCOUNTER — Ambulatory Visit: Admitting: Cardiology

## 2024-06-27 ENCOUNTER — Other Ambulatory Visit: Payer: Self-pay

## 2024-06-27 ENCOUNTER — Ambulatory Visit: Payer: Self-pay

## 2024-06-27 NOTE — Telephone Encounter (Signed)
 Recommend she call Dr. Orion office (GI) to schedule endoscopy and colonoscopy.  She saw him in December and had CT of abdomen which normal.  He wanted to proceed with endoscopy and colonoscopy.

## 2024-06-27 NOTE — Telephone Encounter (Signed)
 1st attempt to contact. Unable to leave message, mailbox full. Placing back in TQ for future attempts to contact.    Message from Fillmore S sent at 06/27/2024  8:24 AM EST  Reason for Triage: pt has a feeling her throat as if something is stuck, and chokes when she eats. mass amount of weight loss and hair Cb: [phone] dghter dana

## 2024-06-27 NOTE — Telephone Encounter (Signed)
 FYI Only or Action Required?: Action required by provider: clinical question for provider, update on patient condition, and scheduled with KYM Quarry, NP 07/04/24.  Patient was last seen in primary care on 04/11/2024 by Bevely Doffing, FNP.  Called Nurse Triage reporting Weight Loss and Fatigue.  Symptoms began several weeks ago.  Interventions attempted: Prescription medications: zofran .  Symptoms are: gradually worsening.  Triage Disposition: See PCP When Office is Open (Within 3 Days)  Patient/caregiver understands and will follow disposition?: Yes     Message from Olam RAMAN sent at 06/27/2024  8:24 AM EST  Reason for Triage: pt has a feeling her throat as if something is stuck, and chokes when she eats. mass amount of weight loss and hair Cb: [phone] dghter dana      Reason for Disposition  [1] Fatigue (i.e., tires easily, decreased energy) AND [2] persists > 1 week  Answer Assessment - Initial Assessment Questions [name] returned call to clinic to f/u on symptoms. Lonell states that pt has hx of scalp psoriasis but is having significant hair loss. Pt does not take vitamins or supplements for hair. She also reported significant weight loss d/t lack of appetite and fatigue; unable to provide approx amount of weight loss. She states pt does not have the energy or desire to do the things she once used to enjoy. She reports nausea and use of dissolvable zofran . Pt would like to be seen by PCP but agreeable to see other provider for eval and lab work. D/t loss of appetite, this RN did not tell pt to fast d/t appt being at 2:30pm. Please advise pt if she should be fasting for labs.   My chart password reset d/t pt being locked out.    1. DESCRIPTION: Describe how you are feeling.     Overall fatigued, not wanting to get out to do things she used to enjoy   3. ONSET: When did these symptoms begin? (e.g., hours, days, weeks, months)     A while   4. CAUSE: What do  you think is causing the weakness or fatigue? (e.g., not drinking enough fluids, medical problem, trouble sleeping)     Pt has significant weight loss, unable to provide approx amount of weight loss with associated lack of appetite   5. NEW MEDICINES:  Have you started on any new medicines recently? (e.g., opioid pain medicines, benzodiazepines, muscle relaxants, antidepressants, antihistamines, neuroleptics, beta blockers)     No   6. OTHER SYMPTOMS: Do you have any other symptoms? (e.g., chest pain, fever, cough, SOB, vomiting, diarrhea, bleeding, other areas of pain)     Hair loss, nausea  Protocols used: Weakness (Generalized) and Fatigue-A-AH

## 2024-06-28 NOTE — Telephone Encounter (Signed)
 Attempt #1 no answer VM full

## 2024-06-28 NOTE — Telephone Encounter (Signed)
 Called. No answer. Vm full. Mychart message sent with provider recommendations

## 2024-06-28 NOTE — Telephone Encounter (Signed)
 Not active on mychart since 2014 so letter mailed

## 2024-07-04 ENCOUNTER — Ambulatory Visit: Admitting: Nurse Practitioner
